# Patient Record
Sex: Female | Born: 1945
Health system: Southern US, Community
[De-identification: ages and names within clinical notes are randomized; demographics above are authoritative.]

## PROBLEM LIST (undated history)

## (undated) DIAGNOSIS — M25569 Pain in unspecified knee: Secondary | ICD-10-CM

## (undated) DIAGNOSIS — M722 Plantar fascial fibromatosis: Secondary | ICD-10-CM

## (undated) DIAGNOSIS — H269 Unspecified cataract: Secondary | ICD-10-CM

## (undated) DIAGNOSIS — M1990 Unspecified osteoarthritis, unspecified site: Secondary | ICD-10-CM

## (undated) DIAGNOSIS — Z9989 Dependence on other enabling machines and devices: Secondary | ICD-10-CM

## (undated) DIAGNOSIS — B1920 Unspecified viral hepatitis C without hepatic coma: Secondary | ICD-10-CM

## (undated) DIAGNOSIS — E1140 Type 2 diabetes mellitus with diabetic neuropathy, unspecified: Secondary | ICD-10-CM

## (undated) DIAGNOSIS — G40909 Epilepsy, unspecified, not intractable, without status epilepticus: Secondary | ICD-10-CM

## (undated) DIAGNOSIS — I10 Essential (primary) hypertension: Secondary | ICD-10-CM

## (undated) DIAGNOSIS — G4733 Obstructive sleep apnea (adult) (pediatric): Secondary | ICD-10-CM

## (undated) DIAGNOSIS — E876 Hypokalemia: Secondary | ICD-10-CM

## (undated) HISTORY — DX: Type 2 diabetes mellitus with diabetic neuropathy, unspecified: E11.40

## (undated) HISTORY — DX: Obstructive sleep apnea (adult) (pediatric): G47.33

## (undated) HISTORY — DX: Morbid (severe) obesity due to excess calories: E66.01

## (undated) HISTORY — PX: EYE SURGERY: SHX253

## (undated) HISTORY — PX: CATARACT EXTRACTION: SUR2

## (undated) HISTORY — DX: Unspecified viral hepatitis C without hepatic coma: B19.20

## (undated) HISTORY — DX: Plantar fascial fibromatosis: M72.2

## (undated) HISTORY — DX: Unspecified cataract: H26.9

## (undated) HISTORY — PX: ABDOMINAL HYSTERECTOMY: SHX81

## (undated) HISTORY — DX: Hypokalemia: E87.6

## (undated) HISTORY — DX: Dependence on other enabling machines and devices: Z99.89

## (undated) HISTORY — DX: Epilepsy, unspecified, not intractable, without status epilepticus: G40.909

## (undated) HISTORY — PX: TUBAL LIGATION: SHX77

---

## 2005-09-02 ENCOUNTER — Encounter: Admission: RE | Admit: 2005-09-02 | Discharge: 2005-09-02 | Payer: Self-pay | Admitting: Family Medicine

## 2006-02-19 ENCOUNTER — Emergency Department (HOSPITAL_COMMUNITY): Admission: EM | Admit: 2006-02-19 | Discharge: 2006-02-19 | Payer: Self-pay | Admitting: Emergency Medicine

## 2006-03-26 ENCOUNTER — Encounter: Admission: RE | Admit: 2006-03-26 | Discharge: 2006-03-26 | Payer: Self-pay | Admitting: Orthopedic Surgery

## 2006-04-01 ENCOUNTER — Encounter: Admission: RE | Admit: 2006-04-01 | Discharge: 2006-04-01 | Payer: Self-pay | Admitting: Orthopedic Surgery

## 2006-04-21 ENCOUNTER — Encounter: Admission: RE | Admit: 2006-04-21 | Discharge: 2006-04-21 | Payer: Self-pay | Admitting: Orthopedic Surgery

## 2007-04-13 ENCOUNTER — Encounter: Admission: RE | Admit: 2007-04-13 | Discharge: 2007-04-13 | Payer: Self-pay | Admitting: Orthopedic Surgery

## 2007-07-02 ENCOUNTER — Encounter: Admission: RE | Admit: 2007-07-02 | Discharge: 2007-07-02 | Payer: Self-pay | Admitting: Family Medicine

## 2008-05-21 ENCOUNTER — Emergency Department (HOSPITAL_COMMUNITY): Admission: EM | Admit: 2008-05-21 | Discharge: 2008-05-21 | Payer: Self-pay | Admitting: Emergency Medicine

## 2009-01-12 ENCOUNTER — Encounter: Admission: RE | Admit: 2009-01-12 | Discharge: 2009-01-12 | Payer: Self-pay | Admitting: Family Medicine

## 2009-02-05 ENCOUNTER — Encounter: Admission: RE | Admit: 2009-02-05 | Discharge: 2009-05-06 | Payer: Self-pay | Admitting: Family Medicine

## 2009-06-08 ENCOUNTER — Encounter: Admission: RE | Admit: 2009-06-08 | Discharge: 2009-09-06 | Payer: Self-pay | Admitting: Family Medicine

## 2010-01-29 ENCOUNTER — Encounter: Admission: RE | Admit: 2010-01-29 | Discharge: 2010-01-29 | Payer: Self-pay | Admitting: Family Medicine

## 2010-05-06 ENCOUNTER — Emergency Department (HOSPITAL_COMMUNITY): Admission: EM | Admit: 2010-05-06 | Discharge: 2010-05-06 | Payer: Self-pay | Admitting: Emergency Medicine

## 2010-07-26 ENCOUNTER — Encounter: Payer: Self-pay | Admitting: Family Medicine

## 2010-07-26 ENCOUNTER — Ambulatory Visit: Payer: Self-pay | Admitting: Family Medicine

## 2010-07-26 DIAGNOSIS — I10 Essential (primary) hypertension: Secondary | ICD-10-CM | POA: Insufficient documentation

## 2010-07-26 DIAGNOSIS — M159 Polyosteoarthritis, unspecified: Secondary | ICD-10-CM | POA: Insufficient documentation

## 2010-07-26 DIAGNOSIS — E669 Obesity, unspecified: Secondary | ICD-10-CM | POA: Insufficient documentation

## 2010-07-26 LAB — CONVERTED CEMR LAB
BUN: 19 mg/dL (ref 6–23)
CO2: 30 meq/L (ref 19–32)
Calcium: 9.4 mg/dL (ref 8.4–10.5)
Chloride: 102 meq/L (ref 96–112)
Creatinine, Ser: 0.92 mg/dL (ref 0.40–1.20)
Glucose, Bld: 118 mg/dL — ABNORMAL HIGH (ref 70–99)
Potassium: 3.9 meq/L (ref 3.5–5.3)
Sodium: 141 meq/L (ref 135–145)

## 2010-07-29 ENCOUNTER — Telehealth: Payer: Self-pay | Admitting: *Deleted

## 2010-08-15 ENCOUNTER — Ambulatory Visit: Payer: Self-pay | Admitting: Family Medicine

## 2010-08-15 ENCOUNTER — Encounter: Payer: Self-pay | Admitting: Family Medicine

## 2010-08-15 ENCOUNTER — Ambulatory Visit (HOSPITAL_COMMUNITY): Admission: RE | Admit: 2010-08-15 | Discharge: 2010-08-15 | Payer: Self-pay | Admitting: Family Medicine

## 2010-08-16 ENCOUNTER — Telehealth: Payer: Self-pay | Admitting: *Deleted

## 2010-08-16 LAB — CONVERTED CEMR LAB
BUN: 20 mg/dL (ref 6–23)
CO2: 27 meq/L (ref 19–32)
Calcium: 9.7 mg/dL (ref 8.4–10.5)
Chloride: 101 meq/L (ref 96–112)
Creatinine, Ser: 0.95 mg/dL (ref 0.40–1.20)
Glucose, Bld: 113 mg/dL — ABNORMAL HIGH (ref 70–99)
Potassium: 3.3 meq/L — ABNORMAL LOW (ref 3.5–5.3)
Sodium: 139 meq/L (ref 135–145)

## 2010-09-04 ENCOUNTER — Ambulatory Visit: Payer: Self-pay | Admitting: Family Medicine

## 2010-09-04 DIAGNOSIS — J019 Acute sinusitis, unspecified: Secondary | ICD-10-CM | POA: Insufficient documentation

## 2010-09-20 ENCOUNTER — Ambulatory Visit: Payer: Self-pay

## 2010-10-14 ENCOUNTER — Telehealth: Payer: Self-pay | Admitting: Family Medicine

## 2010-10-22 ENCOUNTER — Ambulatory Visit: Admit: 2010-10-22 | Payer: Self-pay

## 2010-11-05 NOTE — Progress Notes (Signed)
----   Converted from flag ---- ---- 07/28/2010 6:59 PM, Milinda Antis MD wrote: Please let pt know her lab work which looked at her kidney function was normal. I will see her in 3 weeks to follow-up her blood work ------------------------------  called pt, informed of above message.

## 2010-11-05 NOTE — Assessment & Plan Note (Signed)
Summary: f/u bp   Vital Signs:  Patient profile:   65 year old female Height:      63.25 inches Weight:      250.4 pounds BMI:     44.17 Temp:     98.1 degrees F oral Pulse rate:   66 / minute BP sitting:   167 / 86  (right arm) Cuff size:   large  Vitals Entered By: Jimmy Footman, CMA (September 04, 2010 10:06 AM) CC: HTN, sinus pressure Is Patient Diabetic? No Pain Assessment Patient in pain? no      Comments left itching   Primary Care Provider:  Milinda Antis MD  CC:  HTN and sinus pressure.  History of Present Illness:   Sinus pressure- used nasal spray (nasal saline ) over the counter which helped, but now has itching in the ears , off and on for past few weeks. no pressure in ear, hearing is okay  no cough, no fever   Hypertension-- arm does not fit in cuff at [facility], weight down 3 lbs, no chest pain, no change in vision, feels she needs more medication to help with Bp, no leg swelling  Headache- no further headaches since stopping cardura  No meds this AM secondary to not eating this AM  Habits & Providers  Alcohol-Tobacco-Diet     Tobacco Status: never  Current Medications (verified): 1)  Epitol 200 Mg Tabs (Carbamazepine) .[name].. 1 By Mouth Two Times A Day 2)  Lisinopril-Hydrochlorothiazide 20-25 Mg Tabs (Lisinopril-Hydrochlorothiazide) .[name].. 1 By Mouth Daily For Blood Pressure 3)  Meloxicam 7.5 Mg Tabs (Meloxicam) .[name].. 1 By Mouth Daily 4)  Metoprolol Tartrate 25 Mg Tabs (Metoprolol Tartrate) .... Take 1/2 Tablet By Mouth Two Times A Day  Allergies (verified): No Known Drug Allergies  Past History:  Past Medical History: Last updated: 07/26/2010 HTN Arthritis- multiple sites Bulging Disc in back Leg Swelling Seizure  No history of heart attack or Stroke   Review of Systems       Per HPI  Physical Exam  General:  NAD, obese, pleasant Vital signs noted  Head:  Face non tender over maxillary ethmoid sinus, region, mild pressure over front  region Eyes:  clear conjunctiva Ears:  TM clear bilat, canals clear Nose:  slightly erythematous turbinates, no discharge noted Mouth:  MMM, oropharynx clear Neck:  supple no lymphadenopathy  Lungs:  CTAB Heart:  RRR, no murmur Extremities:  no edema   Impression & Recommendations:  Problem # 1:  ESSENTIAL HYPERTENSION, BENIGN (ICD-401.1) Assessment Unchanged  Pt bp on last visit was normal however over recommended dose for HCTZ and taking cardura which was causing headaches. Will add low dose BP to ACE/HCTZ combo, monitor HR, pt given side effects, recheck in 2 weeks Her updated medication list for this problem includes:    Lisinopril-hydrochlorothiazide 20-25 Mg Tabs (Lisinopril-hydrochlorothiazide) .[name]... 1 by mouth daily for blood pressure    Metoprolol Tartrate 25 Mg Tabs (Metoprolol tartrate) .[name]... Take 1/2 tablet by mouth two times a day   Records release sent again  Orders: [facility]- Est  Level 4 ([address]454)  Problem # 2:  SINUSITIS, ACUTE (ICD-461.9) Assessment: New  Likley viral mediated, improved with nasal saline, pt declines, nasal steroid, for ear itching advised moisturizing, try OTC anti-histamine  Orders: [facility]- Est  Level 4 ([address]214)  Complete Medication List: 1)  Epitol 200 Mg Tabs (Carbamazepine) .[name].. 1 by mouth two times a day 2)  Lisinopril-hydrochlorothiazide 20-25 Mg Tabs (Lisinopril-hydrochlorothiazide) .[name].. 1 by mouth daily  for blood pressure 3)  Meloxicam 7.5 Mg Tabs (Meloxicam) .[name].. 1 by mouth daily 4)  Metoprolol Tartrate 25 Mg Tabs (Metoprolol tartrate) .... Take 1/2 tablet by mouth two times a day  Patient Instructions: 1)  Continue the Nasal Saline you can try over the counter loratadine once a day as needed  2)  Start the new medication Metoprolol take 1/2 tablet two times a day for your blood pressure  3)  Return in 2 weeks for repeat blood pressure check Prescriptions: METOPROLOL TARTRATE 25 MG TABS (METOPROLOL TARTRATE) Take 1/2 tablet by mouth two  times a day  #60 x 1   Entered and Authorized by:   Milinda Antis MD   Signed by:   Milinda Antis MD on 09/04/2010   Method used:   Electronically to        [facility] [phone]* (retail)       [address]       [place], [place]  [address]       Ph: [phone]       Fax: [phone]   RxID:   [id number]    Orders Added: 1)  [facility]- Est  Level 4 [02725]     Prevention & Chronic Care Immunizations   Influenza vaccine: Not documented   Influenza vaccine deferral: Refused  (09/04/2010)    Tetanus booster: Not documented    Pneumococcal vaccine: Not documented    H. zoster vaccine: Not documented  Colorectal Screening   Hemoccult: Not documented    Colonoscopy: Not documented  Other Screening   Pap smear: Not documented    Mammogram: Not documented    DXA bone density scan: Not documented   Smoking status: never  (09/04/2010)  Lipids   Total Cholesterol: Not documented   LDL: Not documented   LDL Direct: Not documented   HDL: Not documented   Triglycerides: Not documented  Hypertension   Last Blood Pressure: 167 / 86  (09/04/2010)   Serum creatinine: 0.95  (08/15/2010)   Serum potassium 3.3  (08/15/2010)    Hypertension flowsheet reviewed?: Yes   Progress toward BP goal: Unchanged  Self-Management Support :   Personal Goals (by the next clinic visit) :      Personal blood pressure goal: 140/90  (09/04/2010)   Hypertension self-management support: Not documented

## 2010-11-05 NOTE — Progress Notes (Signed)
Summary: re: labs/ts  ---- Converted from flag ---- ---- 08/16/2010 8:43 AM, Milinda Antis MD wrote: please let pt know her labs were okay, her kidney function was normal. Her potassium was a little low but she does not need any supplements, I will see her in 2 weeks ------------------------------ called pt and informed. pt agreed.Arlyss Repress CMA,  August 16, 2010 10:27 AM

## 2010-11-05 NOTE — Assessment & Plan Note (Signed)
Summary: f/u  kh   Vital Signs:  Patient profile:   65 year old female Height:      63.25 inches Weight:      253.3 pounds BMI:     44.68 Pulse rate:   85 / minute BP sitting:   137 / 84  (left arm) Cuff size:   large  Vitals Entered By: Arlyss Repress CMA, (August 15, 2010 9:00 AM) CC: f/up HTN and labs. refill meds. Is Patient Diabetic? No Pain Assessment Patient in pain? no        Primary Care Provider:  Milinda Antis MD  CC:  f/up HTN and labs. refill meds..  History of Present Illness:   Sat pt fel x2l wearing high heels, feel on outstreched hand, did not hit her head, pain in left hand, +swelling and pain worse in palm, pt iced and put a brace on it, continued pain but some improvement difficulty cooking, driving, bathing seconary to pain  HTN-  Pt was taking both HCTZ 25mg  and the HCTZ/Lisinopril combo pill daily    Insomina- started taking Cardura back again prescribed previously by Dr. Lora Havens for insomnia for past few days, has noticed a "rythem in her head" occurs at night, no change in vision, no change in speech   Leg swelling has improved, has improved   Ultram caused her to have back pain??, no N/V,no change in stools, so she restarted Meloxicam which helped  Habits & Providers  Alcohol-Tobacco-Diet     Tobacco Status: never  Current Medications (verified): 1)  Epitol 200 Mg Tabs (Carbamazepine) .[name].. 1 By Mouth Two Times A Day 2)  Lisinopril-Hydrochlorothiazide 20-25 Mg Tabs (Lisinopril-Hydrochlorothiazide) .[name].. 1 By Mouth Daily For Blood Pressure 3)  Meloxicam 7.5 Mg Tabs (Meloxicam) .[name].. 1 By Mouth Daily  Allergies (verified): No Known Drug Allergies  Physical Exam  General:  NAD, obese, pleasant Vital signs noted  Head:  atraumatic Eyes:  PERRL, small pupils no hemmorhage seen,diffcult to see entire optic disc Neck:  supple and full ROM.   Lungs:  CTAB Heart:  RRR, no murmur Msk:  Left hand- mild generalized swelling, increased  fullness over thumb, TTP over antomical snuff box, , normal ROM and grasp in hand, though painful, unable to extend thumb without pain,  Right hand-normal strength, grasp Pulses:  radial pulses 2+ Neurologic:  Sensation in hand intact no focal deficits   Impression & Recommendations:  Problem # 1:  ESSENTIAL HYPERTENSION, BENIGN (ICD-401.1) Assessment Improved Pt was taking 50mg  of HCTZ, it appears I sent both prescriptions. Will d/c the 25mg  tab, continue to ACE/HCTZ combo check BMET The following medications were removed from the medication list:    Hydrochlorothiazide 25 Mg Tabs (Hydrochlorothiazide) .[name]... 1 by mouth daily Her updated medication list for this problem includes:    Lisinopril-hydrochlorothiazide 20-25 Mg Tabs (Lisinopril-hydrochlorothiazide) .[name]... 1 by mouth daily for blood pressure  Orders: Basic [facility] ([address]1478) [facility]- Est  Level 4 ([address])  Problem # 2:  PERIPHERAL EDEMA (ICD-782.3) Assessment: Improved  improved s/p d/c norvasc The following medications were removed from the medication list:    Hydrochlorothiazide 25 Mg Tabs (Hydrochlorothiazide) .[name]... 1 by mouth daily Her updated medication list for this problem includes:    Lisinopril-hydrochlorothiazide 20-25 Mg Tabs (Lisinopril-hydrochlorothiazide) .[name]... 1 by mouth daily for blood pressure  Orders: [facility]- Est  Level 4 ([address])  Problem # 3:  HAND PAIN, LEFT (ICD-729.5) Assessment: New Concern for small fracture, as pt fell with outstreched hand and location of pain, currently  has brace and nsaids, obtain x-ray Orders: Radiology other (Radiology Other) [facility]- Est  Level 4 ([address])  Problem # 4:  HEADACHE (ICD-784.0) Assessment: New  Concern this may be due to addition of cardura, no stroke symptoms present , no history of migraine. possibly pt bp too low during the night. will d/c cardura and follow up symptoms The following medications were removed from the medication list:    Ultram 50 Mg Tabs  (Tramadol hcl) .[name]... 1 by mouth three times a day as needed pain Her updated medication list for this problem includes:    Meloxicam 7.5 Mg Tabs (Meloxicam) .[name]... 1 by mouth daily  Orders: [facility]- Est  Level 4 ([address])  Complete Medication List: 1)  Epitol 200 Mg Tabs (Carbamazepine) .[name].. 1 by mouth two times a day 2)  Lisinopril-hydrochlorothiazide 20-25 Mg Tabs (Lisinopril-hydrochlorothiazide) .[name].. 1 by mouth daily for blood pressure 3)  Meloxicam 7.5 Mg Tabs (Meloxicam) .[name].. 1 by mouth daily  Patient Instructions: 1)  Stop taking the cardura 2)  Stop taking the extra HCTZ 25mg  pill  3)  Continue the combination pill of HCTZ/Lisinopril 4)  Continue the Meloxicam  5)  I will call you about the x-ray results  6)  I will call you about your lab results 7)  f/u 2 weeks for blood pressure  Prescriptions: MELOXICAM 7.5 MG TABS (MELOXICAM) 1 by mouth daily  #30 x 3   Entered and Authorized by:   Milinda Antis MD   Signed by:   Milinda Antis MD on 08/15/2010   Method used:   Electronically to        [facility] [phone]* (retail)       [address]       [place], [place]  [address]       Ph: [phone]       Fax: [phone]   RxID:   [id number]    Orders Added: 1)  Basic Met[facility] [[address]] 2)  Radiology other [Radiology Other] 3)  [facility]- Est  Level 4 [[address]74]

## 2010-11-05 NOTE — Miscellaneous (Signed)
Summary: ROI  ROI   Imported By: Clydell Hakim 07/29/2010 16:47:07  _____________________________________________________________________  External Attachment:    Type:   Image     Comment:   External Document

## 2010-11-05 NOTE — Assessment & Plan Note (Signed)
Summary: NP,tcb- need records   Vital Signs:  Patient profile:   65 year old female Height:      63.25 inches Weight:      252.50 pounds BMI:     44.54 BSA:     2.14 Temp:     98.0 degrees F Pulse rate:   64 / minute BP sitting:   160 / 102  Vitals Entered By: Jone Baseman CMA (July 26, 2010 1:45 PM)  Serial Vital Signs/Assessments:  Time      Position  BP       Pulse  Resp  Temp     By                     160/100                        Milinda Antis MD  CC: NP Is Patient Diabetic? No Pain Assessment Patient in pain? no        CC:  NP.  History of Present Illness:   Previously seeing Dr. Parke Simmers  Arthritis- has arthritis in hand and hips, knees multiple cortisone shots, pain occurs every now and then such as changes in whether, wants to join the gym, had lost 30lbs now gained the weight back Care for ger grandchildren. Tried celebrex, seen ortho for shots,   HTN- currently on hydrochlorothiazide and Norvasc , last saw PCP april , does not take bp at home, previouusly on [place] and HCTZ combo, changed in April   Leg Swelling- feel like collecting fluid since May, thinks maybe secondary to weight or being on feet   Seizure disorder- on Tegretol - one seizure in 1994, saw a neurologist, happenend while sleeping had rehab, denies CVA  Prevention- had mammogram this year, last PAP > 5 years ago,  Colonoscopy - 4 years ago   Blood work done in April of 2011  Habits & Providers  Alcohol-Tobacco-Diet     Tobacco Status: never  Current Medications (verified): 1)  Epitol 200 Mg Tabs (Carbamazepine) .[name].. 1 By Mouth Two Times A Day 2)  Hydrochlorothiazide 25 Mg Tabs (Hydrochlorothiazide) .[name].. 1 By Mouth Daily 3)  Lisinopril-Hydrochlorothiazide 20-25 Mg Tabs (Lisinopril-Hydrochlorothiazide) .[name].. 1 By Mouth Daily For Blood Pressure 4)  Ultram 50 Mg Tabs (Tramadol Hcl) .[name].. 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   Psych:  good eye contact and not  depressed appearing.     Impression & Recommendations:  Problem # 1:  ESSENTIAL HYPERTENSION, BENIGN (ICD-401.1) Assessment New Poorly controlled, per history likley pre-diabetic need better control, concened leg edema may be side effect of Norvasc. Add ACE to diuretic and recheck. Check BMET Her updated medication list for this problem includes:    Hydrochlorothiazide 25 Mg Tabs (Hydrochlorothiazide) .[name]... 1 by mouth daily    Lisinopril-hydrochlorothiazide 20-25 Mg Tabs (Lisinopril-hydrochlorothiazide) .[name]... 1 by mouth daily for blood pressure  Orders: Basic [facility] ([address])  Problem # 2:  PERIPHERAL EDEMA (ICD-782.3) Assessment: New d/c norvasc, continue thiazide Her updated medication list for this problem includes:    Hydrochlorothiazide 25 Mg Tabs (Hydrochlorothiazide) .[name]... 1 by mouth daily    Lisinopril-hydrochlorothiazide 20-25 Mg Tabs  (Lisinopril-hydrochlorothiazide) .[name]... 1 by mouth daily for blood pressure  Problem # 3:  DEGENERATIVE JOINT DISEASE, GENERALIZED (ICD-715.00) Assessment: New Arthritis is multiple joints. Trial of Ultram, previously on Meloxicam Get notes from previous PCP Her updated medication list for this problem includes:    Ultram 50 Mg Tabs (Tramadol hcl) .[name]... 1 by mouth three times a day as needed pain  Problem # 4:  OBESITY (ICD-278.00) Assessment: New  Complete Medication List: 1)  Epitol 200 Mg Tabs (Carbamazepine) .[name].. 1 by mouth two times a day 2)  Hydrochlorothiazide 25 Mg Tabs (Hydrochlorothiazide) .[name].. 1 by mouth daily 3)  Lisinopril-hydrochlorothiazide 20-25 Mg Tabs (Lisinopril-hydrochlorothiazide) .[name].. 1 by mouth daily for blood pressure 4)  Ultram 50 Mg Tabs (Tramadol hcl) .[name].. 1 by mouth three times a day as needed pain  Patient Instructions: 1)  Stop taking the Norvasc 2)  Start the combination pill of HTCZ/Lisinopril once a day 3)  Do not take the extra HCTZ tablet with the combination pill 4)  Start the Ultram as needed for pain 5)  We will call you about your labs  6)  Come back in 3 weeks for follow-up on blood pressure  Prescriptions: ULTRAM 50 MG TABS (TRAMADOL HCL) 1 by mouth three times a day as needed pain  #180 x 1   Entered and Authorized by:   Milinda Antis MD   Signed by:   Milinda Antis MD on 07/26/2010   Method used:   Electronically to        [facility]  [address] [phone]* (retail)       [address]       [place], [place]  [id number]       Ph: [phone] or [phone]       Fax: [phone]   RxID:   [id number] LISINOPRIL-HYDROCHLOROTHIAZIDE 20-25 MG TABS (LISINOPRIL-HYDROCHLOROTHIAZIDE) 1 by mouth daily for blood pressure  #30 x 3   Entered and Authorized by:   Milinda Antis MD   Signed by:   Milinda Antis MD on 07/26/2010   Method used:   Electronically to        [facility] [phone]* (retail)        [address]       [place], [place]  [address]       Ph: [phone]       Fax: [phone]   RxID:   [id number] HYDROCHLOROTHIAZIDE 25 MG TABS (HYDROCHLOROTHIAZIDE) 1 by mouth daily  #30 x 1   Entered and Authorized by:   Milinda Antis MD   Signed by:   Milinda Antis MD on 07/26/2010   Method used:   Electronically to  [facility] [address] [phone]* (retail)       [address]       [place], [place]  [address]       Ph: [phone]       Fax: [phone]   RxID:   [id number] EPITOL 200 MG TABS (CARBAMAZEPINE) 1 by mouth two times a day  #180 x 0   Entered and Authorized by:   Milinda Antis MD   Signed by:   Milinda Antis MD on 07/26/2010   Method used:   Electronically to        [facility] [phone]* (retail)       [address]       [place], [place]  [address]       Ph: [phone]       Fax: [phone]   RxID:   [id number]    Orders Added: 1)  Basic Met-[facility] [[address]] 2)  [facility]- New Level 3 [[address]]

## 2010-11-06 ENCOUNTER — Encounter: Payer: Self-pay | Admitting: *Deleted

## 2010-11-07 NOTE — Progress Notes (Signed)
Summary: Rx  Phone Note Refill Request Call back at Home Phone [phone]   Refills Requested: Medication #1:  EPITOL 200 MG TABS 1 by mouth two times a day Initial call taken by: Knox Royalty,  October 14, 2010 9:55 AM    Prescriptions: EPITOL 200 MG TABS (CARBAMAZEPINE) 1 by mouth two times a day  #180 x 1   Entered and Authorized by:   Milinda Antis MD   Signed by:   Milinda Antis MD on 10/14/2010   Method used:   Electronically to        [facility] [phone]* (retail)       [address]       [place], [place]  [address]       Ph: [phone]       Fax: [phone]   RxID:   [id number]

## 2010-12-20 LAB — URINALYSIS, ROUTINE W REFLEX MICROSCOPIC
Bilirubin Urine: NEGATIVE
Glucose, UA: NEGATIVE mg/dL
Hgb urine dipstick: NEGATIVE
Ketones, ur: NEGATIVE mg/dL
Nitrite: NEGATIVE
Protein, ur: NEGATIVE mg/dL
Specific Gravity, Urine: 1.018 (ref 1.005–1.030)
Urobilinogen, UA: 0.2 mg/dL (ref 0.0–1.0)
pH: 7.5 (ref 5.0–8.0)

## 2010-12-24 ENCOUNTER — Other Ambulatory Visit: Payer: Self-pay | Admitting: Family Medicine

## 2010-12-24 DIAGNOSIS — Z1231 Encounter for screening mammogram for malignant neoplasm of breast: Secondary | ICD-10-CM

## 2010-12-30 ENCOUNTER — Ambulatory Visit
Admission: RE | Admit: 2010-12-30 | Discharge: 2010-12-30 | Disposition: A | Discharge status: Home / Self Care | Payer: Self-pay | Source: Ambulatory Visit | Attending: Family Medicine | Admitting: Family Medicine

## 2010-12-30 DIAGNOSIS — Z1231 Encounter for screening mammogram for malignant neoplasm of breast: Secondary | ICD-10-CM

## 2011-04-11 ENCOUNTER — Other Ambulatory Visit: Payer: Self-pay | Admitting: Orthopedic Surgery

## 2011-04-11 DIAGNOSIS — M545 Low back pain, unspecified: Secondary | ICD-10-CM

## 2011-04-22 ENCOUNTER — Ambulatory Visit
Admission: RE | Admit: 2011-04-22 | Discharge: 2011-04-22 | Disposition: A | Discharge status: Home / Self Care | Payer: No Typology Code available for payment source | Source: Ambulatory Visit | Attending: Orthopedic Surgery | Admitting: Orthopedic Surgery

## 2011-04-22 DIAGNOSIS — M545 Low back pain, unspecified: Secondary | ICD-10-CM

## 2011-05-05 ENCOUNTER — Ambulatory Visit (HOSPITAL_COMMUNITY)
Admission: RE | Admit: 2011-05-05 | Discharge: 2011-05-05 | Disposition: A | Discharge status: Home / Self Care | Payer: Self-pay | Source: Ambulatory Visit | Attending: Orthopedic Surgery | Admitting: Orthopedic Surgery

## 2011-05-05 ENCOUNTER — Encounter (HOSPITAL_COMMUNITY)
Admission: RE | Admit: 2011-05-05 | Discharge: 2011-05-05 | Disposition: A | Discharge status: Home / Self Care | Payer: Self-pay | Source: Ambulatory Visit | Attending: Orthopedic Surgery | Admitting: Orthopedic Surgery

## 2011-05-05 ENCOUNTER — Other Ambulatory Visit (HOSPITAL_COMMUNITY): Payer: Self-pay | Admitting: Orthopedic Surgery

## 2011-05-05 DIAGNOSIS — L600 Ingrowing nail: Secondary | ICD-10-CM

## 2011-05-05 DIAGNOSIS — Z01818 Encounter for other preprocedural examination: Secondary | ICD-10-CM | POA: Insufficient documentation

## 2011-05-05 DIAGNOSIS — Z01810 Encounter for preprocedural cardiovascular examination: Secondary | ICD-10-CM | POA: Insufficient documentation

## 2011-05-05 DIAGNOSIS — Z01812 Encounter for preprocedural laboratory examination: Secondary | ICD-10-CM | POA: Insufficient documentation

## 2011-05-05 LAB — BASIC METABOLIC PANEL
BUN: 23 mg/dL (ref 6–23)
CO2: 28 mEq/L (ref 19–32)
Calcium: 10.5 mg/dL (ref 8.4–10.5)
Chloride: 97 mEq/L (ref 96–112)
Creatinine, Ser: 1.03 mg/dL (ref 0.50–1.10)
GFR calc Af Amer: >60 mL/min (ref >60)
GFR calc non Af Amer: 54 mL/min — ABNORMAL LOW (ref >60)
Glucose, Bld: 96 mg/dL (ref 70–99)
Potassium: 4.2 mEq/L (ref 3.5–5.1)
Sodium: 136 mEq/L (ref 135–145)

## 2011-05-05 LAB — CBC
HCT: 36.1 % (ref 36.0–46.0)
Hemoglobin: 12.5 g/dL (ref 12.0–15.0)
MCH: 31 pg (ref 26.0–34.0)
MCHC: 34.6 g/dL (ref 30.0–36.0)
MCV: 89.6 fL (ref 78.0–100.0)
Platelets: 284 10*3/uL (ref 150–400)
RBC: 4.03 MIL/uL (ref 3.87–5.11)
RDW: 12.3 % (ref 11.5–15.5)
WBC: 7.7 10*3/uL (ref 4.0–10.5)

## 2011-05-05 LAB — SURGICAL PCR SCREEN
MRSA, PCR: NEGATIVE
Staphylococcus aureus: NEGATIVE

## 2011-05-13 ENCOUNTER — Ambulatory Visit (HOSPITAL_COMMUNITY)
Admission: RE | Admit: 2011-05-13 | Discharge: 2011-05-13 | Disposition: A | Discharge status: Home / Self Care | Payer: Self-pay | Source: Ambulatory Visit | Attending: Orthopedic Surgery | Admitting: Orthopedic Surgery

## 2011-05-13 DIAGNOSIS — I10 Essential (primary) hypertension: Secondary | ICD-10-CM | POA: Insufficient documentation

## 2011-05-13 DIAGNOSIS — E669 Obesity, unspecified: Secondary | ICD-10-CM | POA: Insufficient documentation

## 2011-05-13 DIAGNOSIS — K219 Gastro-esophageal reflux disease without esophagitis: Secondary | ICD-10-CM | POA: Insufficient documentation

## 2011-05-13 DIAGNOSIS — L600 Ingrowing nail: Secondary | ICD-10-CM | POA: Insufficient documentation

## 2011-05-13 DIAGNOSIS — E119 Type 2 diabetes mellitus without complications: Secondary | ICD-10-CM | POA: Insufficient documentation

## 2011-05-13 LAB — GLUCOSE, CAPILLARY
Glucose-Capillary: 99 mg/dL (ref 70–99)
Glucose-Capillary: 98 mg/dL (ref 70–99)

## 2011-06-18 ENCOUNTER — Emergency Department (HOSPITAL_COMMUNITY): Payer: Self-pay

## 2011-06-18 ENCOUNTER — Emergency Department (HOSPITAL_COMMUNITY)
Admission: EM | Admit: 2011-06-18 | Discharge: 2011-06-18 | Disposition: A | Discharge status: Home / Self Care | Payer: Self-pay | Attending: Emergency Medicine | Admitting: Emergency Medicine

## 2011-06-18 DIAGNOSIS — M7989 Other specified soft tissue disorders: Secondary | ICD-10-CM | POA: Insufficient documentation

## 2011-06-18 DIAGNOSIS — X58XXXA Exposure to other specified factors, initial encounter: Secondary | ICD-10-CM | POA: Insufficient documentation

## 2011-06-18 DIAGNOSIS — I10 Essential (primary) hypertension: Secondary | ICD-10-CM | POA: Insufficient documentation

## 2011-06-18 DIAGNOSIS — IMO0002 Reserved for concepts with insufficient information to code with codable children: Secondary | ICD-10-CM | POA: Insufficient documentation

## 2011-06-18 LAB — GLUCOSE, CAPILLARY: Glucose-Capillary: 181 mg/dL — ABNORMAL HIGH (ref 70–99)

## 2011-06-18 NOTE — Op Note (Signed)
  NAMETAYVA, EASTERDAY NO.:  [id number]  MEDICAL RECORD NO.:  [id number]  LOCATION:                                 FACILITY:  PHYSICIAN:  Burnard Bunting, M.D.    DATE OF BIRTH:  September 09, 1946  DATE OF PROCEDURE: DATE OF DISCHARGE:                              OPERATIVE REPORT   PREOPERATIVE DIAGNOSIS:  Right great toenail ingrowth.  POSTOPERATIVE DIAGNOSIS:  Right great toenail ingrowth.  PROCEDURE:  Right great toe nail removal and ablation of phenol.  SURGEON:  Burnard Bunting, MD.  ASSISTANT:  None.  ANESTHESIA:  Regional.  INDICATIONS:  Cathy Gonzalez is a 65 year old patient with right great toenail ingrowth.  She has had performed prior attempted ablation with regrowth of the toenail.  She presents now for further management.  PROCEDURE IN DETAIL:  The patient was brought to the operating room where regional anesthetic was induced.  IV antibiotics were administered.  Time-out was called.  Right foot was prepped with Hibiclens saline, draped in sterile manner.  Toe Esmarch was utilized for approximately 15 minutes.  Using a Therapist, nutritional, the toe nail was removed off of the sterile and germinal matrix.  With a bottle of sealed phenol on Q-tip was used to cover both sterile and germinal matrix. This was allowed to sit for 2 minutes and was neutralized with alcohol, then irrigated.  The cycle was repeated three times with a care being taken to avoid unnecessary contact with the phenol with the surrounding tissues other than the sterile and germinal matrix.  Following neutralization x3 with a phenol alcohol and saline combination, the toe tourniquet was released.  The toe nail was covered with ointment and Telfa pad.  The patient tolerated the procedure well without immediate complication.  Distal block was administered.     Burnard Bunting, M.D.     GSD/MEDQ  D:  05/13/2011  T:  05/13/2011  Job:  [id number]  Electronically Signed by Reece Agar.  Cathy Gonzalez M.D. on  06/18/2011 08:32:29 AM

## 2011-06-25 ENCOUNTER — Ambulatory Visit (INDEPENDENT_AMBULATORY_CARE_PROVIDER_SITE_OTHER): Payer: Self-pay

## 2011-06-25 ENCOUNTER — Inpatient Hospital Stay (INDEPENDENT_AMBULATORY_CARE_PROVIDER_SITE_OTHER)
Admission: RE | Admit: 2011-06-25 | Discharge: 2011-06-25 | Disposition: A | Discharge status: Home / Self Care | Payer: Self-pay | Source: Ambulatory Visit | Attending: Emergency Medicine | Admitting: Emergency Medicine

## 2011-06-25 DIAGNOSIS — IMO0002 Reserved for concepts with insufficient information to code with codable children: Secondary | ICD-10-CM

## 2011-06-25 LAB — GLUCOSE, CAPILLARY: Glucose-Capillary: 87 mg/dL (ref 70–99)

## 2011-07-14 ENCOUNTER — Other Ambulatory Visit: Payer: Self-pay | Admitting: Orthopedic Surgery

## 2011-07-14 DIAGNOSIS — M545 Low back pain, unspecified: Secondary | ICD-10-CM

## 2011-07-16 ENCOUNTER — Ambulatory Visit
Admission: RE | Admit: 2011-07-16 | Discharge: 2011-07-16 | Disposition: A | Discharge status: Home / Self Care | Payer: Self-pay | Source: Ambulatory Visit | Attending: Orthopedic Surgery | Admitting: Orthopedic Surgery

## 2011-07-16 DIAGNOSIS — M545 Low back pain, unspecified: Secondary | ICD-10-CM

## 2011-07-16 MED ORDER — METHYLPREDNISOLONE ACETATE 40 MG/ML INJ SUSP (RADIOLOG
120.0000 mg | Freq: Once | INTRAMUSCULAR | Status: AC
  Administered 2011-07-16: 120 mg via EPIDURAL

## 2011-07-16 MED ORDER — IOHEXOL 180 MG/ML  SOLN
1.0000 mL | Freq: Once | INTRAMUSCULAR | Status: AC | PRN
  Administered 2011-07-16: 1 mL via EPIDURAL

## 2011-07-16 NOTE — Patient Instructions (Signed)

## 2011-12-02 ENCOUNTER — Other Ambulatory Visit (HOSPITAL_COMMUNITY): Payer: Self-pay | Admitting: Family Medicine

## 2011-12-02 DIAGNOSIS — Z1231 Encounter for screening mammogram for malignant neoplasm of breast: Secondary | ICD-10-CM

## 2011-12-25 ENCOUNTER — Emergency Department (HOSPITAL_COMMUNITY): Payer: Self-pay

## 2011-12-25 ENCOUNTER — Encounter (HOSPITAL_COMMUNITY): Payer: Self-pay | Admitting: Emergency Medicine

## 2011-12-25 ENCOUNTER — Emergency Department (INDEPENDENT_AMBULATORY_CARE_PROVIDER_SITE_OTHER): Payer: Self-pay

## 2011-12-25 ENCOUNTER — Emergency Department (INDEPENDENT_AMBULATORY_CARE_PROVIDER_SITE_OTHER)
Admission: EM | Admit: 2011-12-25 | Discharge: 2011-12-25 | Disposition: A | Discharge status: Home / Self Care | Payer: Self-pay | Source: Home / Self Care

## 2011-12-25 ENCOUNTER — Ambulatory Visit (HOSPITAL_COMMUNITY)
Admission: RE | Admit: 2011-12-25 | Discharge: 2011-12-25 | Disposition: A | Discharge status: Home / Self Care | Payer: Self-pay | Source: Ambulatory Visit | Attending: Family Medicine | Admitting: Family Medicine

## 2011-12-25 DIAGNOSIS — T3370XA Superficial frostbite of unspecified knee and lower leg, initial encounter: Secondary | ICD-10-CM

## 2011-12-25 DIAGNOSIS — Z1231 Encounter for screening mammogram for malignant neoplasm of breast: Secondary | ICD-10-CM

## 2011-12-25 DIAGNOSIS — T3390XA Superficial frostbite of unspecified sites, initial encounter: Secondary | ICD-10-CM

## 2011-12-25 DIAGNOSIS — S8000XA Contusion of unspecified knee, initial encounter: Secondary | ICD-10-CM

## 2011-12-25 DIAGNOSIS — S8001XA Contusion of right knee, initial encounter: Secondary | ICD-10-CM

## 2011-12-25 HISTORY — DX: Essential (primary) hypertension: I10

## 2011-12-25 HISTORY — DX: Unspecified osteoarthritis, unspecified site: M19.90

## 2011-12-25 MED ORDER — SILVER SULFADIAZINE 1 % EX CREA: TOPICAL_CREAM | Freq: Every day | CUTANEOUS | Status: DC

## 2011-12-25 MED ORDER — SILVER SULFADIAZINE 1 % EX CREA: TOPICAL_CREAM | Freq: Once | CUTANEOUS | Status: DC

## 2011-12-25 MED ORDER — TRAMADOL HCL 50 MG PO TABS: 50.0000 mg | ORAL_TABLET | Freq: Four times a day (QID) | ORAL | Status: AC | PRN

## 2011-12-25 NOTE — ED Provider Notes (Signed)
History     CSN: [id number]  Arrival date & time 12/25/11  4098   None     Chief Complaint  Patient presents with  . Knee Pain    (Consider location/radiation/quality/duration/timing/severity/associated sxs/prior treatment) HPI Comments: Patient presents today with complaints of a right knee injury. She states that she was at [employer] yesterday when she tripped over a stand and fell to the floor landing on her right knee. She had onset of immediate pain with her fall and a burning sensation. She states her knee pain worsens with bending her knee. No increase in pain with weight bearing. When she went home she put an ice pack on her knee. She has since developed redness and blisters on her knee. She reports a history of degenerative joint disease, including her knees.   Past Medical History  Diagnosis Date  . Hypertension   . Diabetes mellitus   . Arthritis     Past Surgical History  Procedure Date  . Abdominal hysterectomy     History reviewed. No pertinent family history.  History  Substance Use Topics  . Smoking status: Never Smoker   . Smokeless tobacco: Never Used  . Alcohol Use: No    OB History    Grav Para Term Preterm Abortions TAB SAB Ect Mult Living                  Review of Systems  Constitutional: Negative for fever.  Musculoskeletal: Negative for joint swelling.  Skin: Positive for color change.  Neurological: Negative for dizziness, numbness and headaches.    Allergies  Review of patient's allergies indicates no known allergies.  Home Medications   Current Outpatient Rx  Name Route Sig Dispense Refill  . AMLODIPINE BESYLATE 10 MG PO TABS Oral Take 10 mg by mouth daily.    . AZILSARTAN-CHLORTHALIDONE 40-25 MG PO TABS Oral Take 40 mg by mouth daily.    [name] CARBAMAZEPINE 200 MG PO TABS Oral Take 200 mg by mouth 2 (two) times daily.      . MELOXICAM 7.5 MG PO TABS Oral Take 7.5 mg by mouth daily.      [name] METFORMIN HCL 500 MG PO TABS Oral  Take 500 mg by mouth 2 (two) times daily with a meal.    . METOPROLOL TARTRATE 25 MG PO TABS  Take 1/2 tablet by mouth two times a day     . LISINOPRIL-HYDROCHLOROTHIAZIDE 20-25 MG PO TABS Oral Take 1 tablet by mouth daily. for blood pressure     . SILVER SULFADIAZINE 1 % EX CREA Topical Apply topically daily. 50 g 0  . TRAMADOL HCL 50 MG PO TABS Oral Take 1 tablet (50 mg total) by mouth every 6 (six) hours as needed for pain. 12 tablet 0    BP 152/88  Pulse 76  Temp(Src) 97.9 F (36.6 C) (Oral)  Resp 16  SpO2 97%  Physical Exam  Nursing note and vitals reviewed. Constitutional: She appears well-developed and well-nourished. No distress.  HENT:  Head: Normocephalic and atraumatic.  Musculoskeletal: Normal range of motion.       Right knee: She exhibits normal range of motion, no swelling, no effusion, no ecchymosis, no laceration, normal alignment, no LCL laxity, normal patellar mobility and no MCL laxity. tenderness (very TTP patella) found. Medial joint line (mild) tenderness noted. No lateral joint line, no MCL, no LCL and no patellar tendon tenderness noted.  Neurological: She is alert.  Skin: Skin is warm and dry.  There is erythema.     Psychiatric: She has a normal mood and affect.    ED Course  Procedures (including critical care time)  Labs Reviewed - No data to display Dg Knee Complete 4 Views Right  12/25/2011  *RADIOLOGY REPORT*  Clinical Data: Right knee pain  RIGHT KNEE - COMPLETE 4+ VIEW  Comparison: None.  Findings: No fracture or dislocation is seen.  Moderate tricompartmental degenerative changes, most prominent in the patellofemoral compartment.  Possible mild prepatellar soft tissue swelling.  No suprapatellar knee joint effusion.  IMPRESSION: No fracture or dislocation is seen.  Possible mild prepatellar soft tissue swelling.  Moderate tricompartmental degenerative changes.a  Original Report Authenticated By: Charline Bills, M.D.     1. Contusion of knee,  right   2. Superficial frostbite of knee or lower leg       MDM  Xray reviewed by myself and radiologist.         Melody Comas, PA 12/25/11 1330

## 2011-12-25 NOTE — ED Notes (Addendum)
Patient fell yesterday morning at [employer] while paying for her bill. She tripped over one of the stands and slid over a wooden floor. Her knee was burning and she put an ice pack over it. She noticed the blisters last night. 4-5 noted blisters on the right knee and she's sore on the right arm. Pt's right knee and calf are swollen. Circulation is good on both feet. Limited ROM in right leg and arm. No pain in left leg and arm.

## 2011-12-25 NOTE — Discharge Instructions (Signed)
Wash your right knee with mild soap and water once a day. Then apply the prescription silvadene cream and apply a clean dressing. Increase your meloxicam to twice a day for one week. You may use ice packs 3-4 times a day for 15-20 minutes only if needed for discomfort. Do not put ice packs directly on your skin. This should be put over clothing or a towel. Continue your knee brace. Follow up with your primary care dr on Monday for a recheck.    Frostbite When your skin is exposed to cold for a long period of time, the underlying tissues may freeze and suffer permanent damage. This condition is called frostbite. Commonly affected areas include the feet, hands (especially the fingers), nose, and ears. People with poor circulation, the elderly, people with poorly controlled diabetes, and people with alcoholism are at greater risk for frostbite. Frostbite ranges in severity from mild and reversible to severe with possible permanent tissue death and loss. DIAGNOSIS Frostbite is diagnosed when areas of the skin appear gray or pale and feel hard. There may be a lack of feeling in the area, or there may be severe pain. As the frostbitten part warms, a red color may return to the area, and there is usually increased pain. TREATMENT  When frostbite is present there may also be hypothermia. This is a drop in the temperature of the entire body. It is important to check the temperature and make sure it is normal (98.6 F [37 C]). It is important to warm frostbitten parts as soon as possible to avoid permanent damage. The following steps can be taken to help:  Get out of the cold. Warm hands in your armpits. Cover the nose and ears with gloved hands. Removing the person from a cold environment is the number one priority, if possible.   Do not rub affected areas or use them. Do not walk on frostbitten feet unless it is necessary to get to a warm place.   If emergency help is not available, the frostbitten parts  may be submerged in lukewarm water. The water should not be hot.   If pain occurs with warming, this is a good sign. It means circulation is returning. If there is no pain or lack of feeling remains, it is necessary to seek immediate medical help.   After you warm the affected area it is very important that this area is not re-exposed to cold. Stay out of the cold.   Do not smoke. Smoking reduces your circulation.  PREVENTION  Wear warm clothing in layers, starting with long underwear and adding additional loose-fitting garments.   Wear mittens. Mittens lock in air, and the fingers help warm each other.   Wear 2 pairs of socks. Wear cotton socks next to the skin and wool socks over the cotton socks.   Wear waterproof boots or shoes that are not tight-fitting.   Wear a pull down cap or hood covering the head, ears, neck, and face if necessary.   Avoid alcohol. This increases heat loss and increases chances of frostbite.   Avoid smoking. Nicotine causes blood vessels to constrict in your hands and feet.   Pack food when you plan to be in the cold. Increasing your caloric intake to a high level increases your internal heat.   Carry extra blankets and clothing in the car during cold weather.   Be aware of the wind chill factor and dress accordingly. Wind is an important factor that can increase the risk  of frostbite. Avoid high winds when low temperatures are present.   Always avoid situations where it is impossible to get out of the cold if needed.   At the first signs of frostbite (numbness, pain, extreme redness), you should immediately get out of the cold.  HOME CARE INSTRUCTIONS   If bandages (dressings) were applied, change them as directed.   Follow up with your caregiver. Keep any appointments with specialists as directed. Failure to follow up could result in infection, loss of tissue, and chronic pain or disability.  SEEK IMMEDIATE MEDICAL CARE IF:   You have a fever.    You develop redness, lose tissue, or there is yellowish-white fluid (pus) coming from the frostbitten area.   You develop increasing pain in the frostbitten area.   The frostbitten area begins to turn a dark color.   You see a red streak going away from the frostbitten area.  MAKE SURE YOU:   Understand these instructions.   Will watch your condition.   Will get help right away if you are not doing well or get worse.  Document Released: 12/29/2000 Document Revised: 09/11/2011 Document Reviewed: 03/13/2011 [facility] Patient Information 2012 [place], [place]. Frostbite When your skin is exposed to cold for a long period of time, the underlying tissues may freeze and suffer permanent damage. This condition is called frostbite. Commonly affected areas include the feet, hands (especially the fingers), nose, and ears. People with poor circulation, the elderly, people with poorly controlled diabetes, and people with alcoholism are at greater risk for frostbite. Frostbite ranges in severity from mild and reversible to severe with possible permanent tissue death and loss. DIAGNOSIS Frostbite is diagnosed when areas of the skin appear gray or pale and feel hard. There may be a lack of feeling in the area, or there may be severe pain. As the frostbitten part warms, a red color may return to the area, and there is usually increased pain. TREATMENT  When frostbite is present there may also be hypothermia. This is a drop in the temperature of the entire body. It is important to check the temperature and make sure it is normal (98.6 F [37 C]). It is important to warm frostbitten parts as soon as possible to avoid permanent damage. The following steps can be taken to help:  Get out of the cold. Warm hands in your armpits. Cover the nose and ears with gloved hands. Removing the person from a cold environment is the number one priority, if possible.   Do not rub affected areas or use them. Do not walk on  frostbitten feet unless it is necessary to get to a warm place.   If emergency help is not available, the frostbitten parts may be submerged in lukewarm water. The water should not be hot.   If pain occurs with warming, this is a good sign. It means circulation is returning. If there is no pain or lack of feeling remains, it is necessary to seek immediate medical help.   After you warm the affected area it is very important that this area is not re-exposed to cold. Stay out of the cold.   Do not smoke. Smoking reduces your circulation.  PREVENTION  Wear warm clothing in layers, starting with long underwear and adding additional loose-fitting garments.   Wear mittens. Mittens lock in air, and the fingers help warm each other.   Wear 2 pairs of socks. Wear cotton socks next to the skin and wool socks over the cotton  socks.   Wear waterproof boots or shoes that are not tight-fitting.   Wear a pull down cap or hood covering the head, ears, neck, and face if necessary.   Avoid alcohol. This increases heat loss and increases chances of frostbite.   Avoid smoking. Nicotine causes blood vessels to constrict in your hands and feet.   Pack food when you plan to be in the cold. Increasing your caloric intake to a high level increases your internal heat.   Carry extra blankets and clothing in the car during cold weather.   Be aware of the wind chill factor and dress accordingly. Wind is an important factor that can increase the risk of frostbite. Avoid high winds when low temperatures are present.   Always avoid situations where it is impossible to get out of the cold if needed.   At the first signs of frostbite (numbness, pain, extreme redness), you should immediately get out of the cold.  HOME CARE INSTRUCTIONS   If bandages (dressings) were applied, change them as directed.   Follow up with your caregiver. Keep any appointments with specialists as directed. Failure to follow up could  result in infection, loss of tissue, and chronic pain or disability.  SEEK IMMEDIATE MEDICAL CARE IF:   You have a fever.   You develop redness, lose tissue, or there is yellowish-white fluid (pus) coming from the frostbitten area.   You develop increasing pain in the frostbitten area.   The frostbitten area begins to turn a dark color.   You see a red streak going away from the frostbitten area.  MAKE SURE YOU:   Understand these instructions.   Will watch your condition.   Will get help right away if you are not doing well or get worse.  Document Released: 12/29/2000 Document Revised: 09/11/2011 Document Reviewed: 03/13/2011 [facility] Patient Information 2012 [place], [place].

## 2011-12-25 NOTE — ED Notes (Signed)
Patient fell yesterday morning at [employer] while paying for her bill. She tripped over one of the stands and slid over a wooden floor. Her knee was burning and she put an ice pack over it. She noticed the blisters last night. 4-5 noted blisters on the right knee and she's sore on the right arm. Pt's right knee and calf are swollen. Circulation is good on both feet. Limited ROM in right leg and arm. No pain in left leg and arm. 

## 2012-01-05 NOTE — ED Provider Notes (Signed)
Medical screening examination/treatment/procedure(s) were performed by non-physician practitioner and as supervising physician I was immediately available for consultation/collaboration.  Jarelly Rinck G  D.O.    Deontre Allsup G Isatou Agredano, MD 01/05/12 0815 

## 2012-02-29 ENCOUNTER — Emergency Department (HOSPITAL_COMMUNITY)
Admission: EM | Admit: 2012-02-29 | Discharge: 2012-02-29 | Disposition: A | Discharge status: Home / Self Care | Payer: Self-pay | Attending: Emergency Medicine | Admitting: Emergency Medicine

## 2012-02-29 ENCOUNTER — Emergency Department (HOSPITAL_COMMUNITY): Payer: Self-pay

## 2012-02-29 ENCOUNTER — Encounter (HOSPITAL_COMMUNITY): Payer: Self-pay | Admitting: Family Medicine

## 2012-02-29 DIAGNOSIS — R0602 Shortness of breath: Secondary | ICD-10-CM | POA: Insufficient documentation

## 2012-02-29 DIAGNOSIS — I10 Essential (primary) hypertension: Secondary | ICD-10-CM | POA: Insufficient documentation

## 2012-02-29 DIAGNOSIS — R079 Chest pain, unspecified: Secondary | ICD-10-CM | POA: Insufficient documentation

## 2012-02-29 DIAGNOSIS — E119 Type 2 diabetes mellitus without complications: Secondary | ICD-10-CM | POA: Insufficient documentation

## 2012-02-29 DIAGNOSIS — K219 Gastro-esophageal reflux disease without esophagitis: Secondary | ICD-10-CM | POA: Insufficient documentation

## 2012-02-29 LAB — TROPONIN I
Troponin I: <0.3 ng/mL (ref <0.30)
Troponin I: <0.3 ng/mL (ref <0.30)

## 2012-02-29 LAB — COMPREHENSIVE METABOLIC PANEL
ALT: 52 U/L — ABNORMAL HIGH (ref 0–35)
AST: 45 U/L — ABNORMAL HIGH (ref 0–37)
Albumin: 4.2 g/dL (ref 3.5–5.2)
Alkaline Phosphatase: 69 U/L (ref 39–117)
BUN: 15 mg/dL (ref 6–23)
CO2: 28 mEq/L (ref 19–32)
Calcium: 10.3 mg/dL (ref 8.4–10.5)
Chloride: 98 mEq/L (ref 96–112)
Creatinine, Ser: 0.8 mg/dL (ref 0.50–1.10)
GFR calc Af Amer: 88 mL/min — ABNORMAL LOW (ref >90)
GFR calc non Af Amer: 76 mL/min — ABNORMAL LOW (ref >90)
Glucose, Bld: 132 mg/dL — ABNORMAL HIGH (ref 70–99)
Potassium: 3.6 mEq/L (ref 3.5–5.1)
Sodium: 137 mEq/L (ref 135–145)
Total Bilirubin: 0.3 mg/dL (ref 0.3–1.2)
Total Protein: 9.2 g/dL — ABNORMAL HIGH (ref 6.0–8.3)

## 2012-02-29 LAB — CBC
HCT: 37.5 % (ref 36.0–46.0)
Hemoglobin: 12.9 g/dL (ref 12.0–15.0)
MCH: 30.8 pg (ref 26.0–34.0)
MCHC: 34.4 g/dL (ref 30.0–36.0)
MCV: 89.5 fL (ref 78.0–100.0)
Platelets: 262 10*3/uL (ref 150–400)
RBC: 4.19 MIL/uL (ref 3.87–5.11)
RDW: 12.1 % (ref 11.5–15.5)
WBC: 6.2 10*3/uL (ref 4.0–10.5)

## 2012-02-29 LAB — GLUCOSE, CAPILLARY: Glucose-Capillary: 178 mg/dL — ABNORMAL HIGH (ref 70–99)

## 2012-02-29 MED ORDER — ASPIRIN 325 MG PO TABS
325.0000 mg | ORAL_TABLET | Freq: Once | ORAL | Status: AC
  Administered 2012-02-29: 325 mg via ORAL
  Filled 2012-02-29: qty 1

## 2012-02-29 MED ORDER — OMEPRAZOLE 20 MG PO CPDR: 20.0000 mg | DELAYED_RELEASE_CAPSULE | Freq: Every day | ORAL | Status: DC

## 2012-02-29 NOTE — Discharge Instructions (Signed)
Chest Pain (Nonspecific) It is often hard to give a specific diagnosis for the cause of chest pain. There is always a chance that your pain could be related to something serious, such as a heart attack or a blood clot in the lungs. You need to follow up with your caregiver for further evaluation. CAUSES   Heartburn.   Pneumonia or bronchitis.   Anxiety or stress.   Inflammation around your heart (pericarditis) or lung (pleuritis or pleurisy).   A blood clot in the lung.   A collapsed lung (pneumothorax). It can develop suddenly on its own (spontaneous pneumothorax) or from injury (trauma) to the chest.   Shingles infection (herpes zoster virus).  The chest wall is composed of bones, muscles, and cartilage. Any of these can be the source of the pain.  The bones can be bruised by injury.   The muscles or cartilage can be strained by coughing or overwork.   The cartilage can be affected by inflammation and become sore (costochondritis).  DIAGNOSIS  Lab tests or other studies, such as X-rays, electrocardiography, stress testing, or cardiac imaging, may be needed to find the cause of your pain.  TREATMENT   Treatment depends on what may be causing your chest pain. Treatment may include:   Acid blockers for heartburn.   Anti-inflammatory medicine.   Pain medicine for inflammatory conditions.   Antibiotics if an infection is present.   You may be advised to change lifestyle habits. This includes stopping smoking and avoiding alcohol, caffeine, and chocolate.   You may be advised to keep your head raised (elevated) when sleeping. This reduces the chance of acid going backward from your stomach into your esophagus.   Most of the time, nonspecific chest pain will improve within 2 to 3 days with rest and mild pain medicine.  HOME CARE INSTRUCTIONS   If antibiotics were prescribed, take your antibiotics as directed. Finish them even if you start to feel better.   For the next few  days, avoid physical activities that bring on chest pain. Continue physical activities as directed.   Do not smoke.   Avoid drinking alcohol.   Only take over-the-counter or prescription medicine for pain, discomfort, or fever as directed by your caregiver.   Follow your caregiver's suggestions for further testing if your chest pain does not go away.   Keep any follow-up appointments you made. If you do not go to an appointment, you could develop lasting (chronic) problems with pain. If there is any problem keeping an appointment, you must call to reschedule.  SEEK MEDICAL CARE IF:   You think you are having problems from the medicine you are taking. Read your medicine instructions carefully.   Your chest pain does not go away, even after treatment.   You develop a rash with blisters on your chest.  SEEK IMMEDIATE MEDICAL CARE IF:   You have increased chest pain or pain that spreads to your arm, neck, jaw, back, or abdomen.   You develop shortness of breath, an increasing cough, or you are coughing up blood.   You have severe back or abdominal pain, feel nauseous, or vomit.   You develop severe weakness, fainting, or chills.   You have a fever.  THIS IS AN EMERGENCY. Do not wait to see if the pain will go away. Get medical help at once. Call your local emergency services (911 in [place] Do not drive yourself to the hospital. MAKE SURE YOU:   Understand these instructions.     Will watch your condition.   Will get help right away if you are not doing well or get worse.  Document Released: 07/02/2005 Document Revised: 09/11/2011 Document Reviewed: 04/27/2008 [employer] Patient Information 2012 [employer]. 

## 2012-02-29 NOTE — ED Notes (Signed)
Pt sts feels like choking in throat since last night. Hx of reflux. sts has had before and takes zantac. Saw her Dr. Tuesday

## 2012-02-29 NOTE — ED Provider Notes (Signed)
History   This chart was scribed for Lyanne Co, MD by Toya Smothers. The patient was seen in room STRE7/STRE7. Patient's care was started at 1424.  CSN: [id number]  Arrival date & time 02/29/12  1424   First MD Initiated Contact with Patient 02/29/12 1623      Chief Complaint  Patient presents with  . Gastrophageal Reflux    HPI  Cathy Gonzalez is a 66 y.o. female who presents to the Emergency Department complaining of chest pain that began approximately one month ago.  Her symptoms are intermittent.  They're worse when she eats.  Last night she reports she felt a burning they came up into her throat and made her feel like she was choking.  She reports occasional shortness of breath but she denies shortness of breath every time.  She saw her primary care doctor several days ago regarding the symptoms.  Her symptoms are not exacerbated by exertion.  She has no unilateral leg swelling.  She has no prior history of pulmonary embolism or DVT.  She does have a history of hypertension diabetes.  She denies cigarette history.  She denies early family history of heart disease.  She denies heart disease in all of her family members.  At this time her discomfort is not present    Past Medical History  Diagnosis Date  . Hypertension   . Diabetes mellitus   . Arthritis     Past Surgical History  Procedure Date  . Abdominal hysterectomy     History reviewed. No pertinent family history.  History  Substance Use Topics  . Smoking status: Never Smoker   . Smokeless tobacco: Never Used  . Alcohol Use: No    Review of Systems  Constitutional: Negative for fever and chills.  HENT: Negative for neck pain.   Respiratory: Positive for shortness of breath.        Chest discomfort  Cardiovascular: Positive for chest pain.  Gastrointestinal: Negative for nausea and vomiting.       Gastroesophageal discomfort  Neurological: Negative for weakness.  All other systems reviewed and are  negative.  A complete 10 system review of systems was obtained and all systems are negative except as noted in the HPI and PMH.    Allergies  Review of patient's allergies indicates no known allergies.  Home Medications   Current Outpatient Rx  Name Route Sig Dispense Refill  . AMLODIPINE BESYLATE 10 MG PO TABS Oral Take 10 mg by mouth daily.    . AZILSARTAN-CHLORTHALIDONE 40-25 MG PO TABS Oral Take 40 mg by mouth daily.    [name] CARBAMAZEPINE 200 MG PO TABS Oral Take 200 mg by mouth 2 (two) times daily.      . MELOXICAM 15 MG PO TABS Oral Take 15 mg by mouth daily.    [name] METFORMIN HCL 500 MG PO TABS Oral Take 500 mg by mouth at bedtime.     [name] RANITIDINE HCL 150 MG PO TABS Oral Take 300 mg by mouth daily.      BP 148/97  Pulse 102  Temp(Src) 98.5 F (36.9 C) (Oral)  Resp 16  SpO2 98%  Physical Exam  Nursing note and vitals reviewed. Constitutional: She is oriented to person, place, and time. She appears well-developed and well-nourished. No distress.  HENT:  Head: Normocephalic and atraumatic.  Eyes: EOM are normal.  Neck: Neck supple. No tracheal deviation present.  Cardiovascular: Normal rate, regular rhythm, normal heart sounds and intact distal pulses.  Exam  reveals no gallop and no friction rub.   No murmur heard. Pulmonary/Chest: Effort normal. No respiratory distress. She has no wheezes. She has no rales.  Musculoskeletal: Normal range of motion. She exhibits no edema and no tenderness.  Neurological: She is alert and oriented to person, place, and time.  Skin: Skin is warm and dry.  Psychiatric: She has a normal mood and affect. Her behavior is normal.    ED Course  Procedures (including critical care time)  DIAGNOSTIC STUDIES: Oxygen Saturation is 98% on room air, normal by my interpretation.    COORDINATION OF CARE:   Date: 02/29/2012  Rate: 95  Rhythm: normal sinus rhythm  QRS Axis: normal  Intervals: normal  ST/T Wave abnormalities: normal  Conduction  Disutrbances: none  Narrative Interpretation:   Old EKG Reviewed: No significant changes noted     Labs Reviewed  GLUCOSE, CAPILLARY - Abnormal; Notable for the following:    Glucose-Capillary 178 (*)    All other components within normal limits  COMPREHENSIVE METABOLIC PANEL - Abnormal; Notable for the following:    Glucose, Bld 132 (*)    Total Protein 9.2 (*)    AST 45 (*)    ALT 52 (*)    GFR calc non Af Amer 76 (*)    GFR calc Af Amer 88 (*)    All other components within normal limits  CBC  TROPONIN I  TROPONIN I   Dg Chest 2 View  02/29/2012  *RADIOLOGY REPORT*  Clinical Data: Chest pain, indigestion, weakness.  History of hypertension, diabetes, sleep apnea.  CHEST - 2 VIEW  Comparison: 05/05/2011  Findings: Cardiomediastinal silhouette is within normal limits. The lungs are free of focal consolidations and pleural effusions. No edema.  There are mild degenerative changes in the mid thoracic spine.  IMPRESSION: No evidence for acute cardiopulmonary abnormality.  Original Report Authenticated By: Patterson Hammersmith, M.D.     1. Chest pain      MDM  I suspect this patient's symptoms are more related to gastroesophageal reflux disease.  EKG has no ST changes.  I did obtain 2 sets of cardiac enzymes as the patient does have cardiac risk factors that include hypertension diabetes.  He's negative in the emergency department.  I feel that she is stable for discharge home.  I do think she is followup with the cardiologist as outpatient I've given at this contact information.  She understands the importance of close followup with her PCP with a cardiologist and understands to return the emergency department for new or worsening symptoms my suspicion for ACS is very low   I personally performed the services described in this documentation, which was scribed in my presence. The recorded information has been reviewed and considered.         Lyanne Co, MD 02/29/12 2006

## 2012-07-13 ENCOUNTER — Encounter (HOSPITAL_COMMUNITY): Payer: Self-pay | Admitting: *Deleted

## 2012-07-13 ENCOUNTER — Emergency Department (HOSPITAL_COMMUNITY): Payer: Self-pay

## 2012-07-13 ENCOUNTER — Emergency Department (HOSPITAL_COMMUNITY)
Admission: EM | Admit: 2012-07-13 | Discharge: 2012-07-13 | Disposition: A | Discharge status: Home Health | Payer: Self-pay | Attending: Emergency Medicine | Admitting: Emergency Medicine

## 2012-07-13 DIAGNOSIS — R42 Dizziness and giddiness: Secondary | ICD-10-CM | POA: Insufficient documentation

## 2012-07-13 DIAGNOSIS — I10 Essential (primary) hypertension: Secondary | ICD-10-CM | POA: Insufficient documentation

## 2012-07-13 DIAGNOSIS — E119 Type 2 diabetes mellitus without complications: Secondary | ICD-10-CM | POA: Insufficient documentation

## 2012-07-13 DIAGNOSIS — R002 Palpitations: Secondary | ICD-10-CM | POA: Insufficient documentation

## 2012-07-13 DIAGNOSIS — Z79899 Other long term (current) drug therapy: Secondary | ICD-10-CM | POA: Insufficient documentation

## 2012-07-13 DIAGNOSIS — Z8739 Personal history of other diseases of the musculoskeletal system and connective tissue: Secondary | ICD-10-CM | POA: Insufficient documentation

## 2012-07-13 LAB — CBC WITH DIFFERENTIAL/PLATELET
Basophils Absolute: 0 10*3/uL (ref 0.0–0.1)
Basophils Relative: 0 % (ref 0–1)
Eosinophils Absolute: 0.1 10*3/uL (ref 0.0–0.7)
Eosinophils Relative: 1 % (ref 0–5)
HCT: 35.7 % — ABNORMAL LOW (ref 36.0–46.0)
Hemoglobin: 12.5 g/dL (ref 12.0–15.0)
Lymphocytes Relative: 35 % (ref 12–46)
Lymphs Abs: 2.5 10*3/uL (ref 0.7–4.0)
MCH: 31 pg (ref 26.0–34.0)
MCHC: 35 g/dL (ref 30.0–36.0)
MCV: 88.6 fL (ref 78.0–100.0)
Monocytes Absolute: 0.6 10*3/uL (ref 0.1–1.0)
Monocytes Relative: 9 % (ref 3–12)
Neutro Abs: 3.9 10*3/uL (ref 1.7–7.7)
Neutrophils Relative %: 55 % (ref 43–77)
Platelets: 250 10*3/uL (ref 150–400)
RBC: 4.03 MIL/uL (ref 3.87–5.11)
RDW: 12 % (ref 11.5–15.5)
WBC: 7.1 10*3/uL (ref 4.0–10.5)

## 2012-07-13 LAB — COMPREHENSIVE METABOLIC PANEL
ALT: 40 U/L — ABNORMAL HIGH (ref 0–35)
AST: 40 U/L — ABNORMAL HIGH (ref 0–37)
Albumin: 3.9 g/dL (ref 3.5–5.2)
Alkaline Phosphatase: 72 U/L (ref 39–117)
BUN: 15 mg/dL (ref 6–23)
CO2: 27 mEq/L (ref 19–32)
Calcium: 10.2 mg/dL (ref 8.4–10.5)
Chloride: 99 mEq/L (ref 96–112)
Creatinine, Ser: 0.91 mg/dL (ref 0.50–1.10)
GFR calc Af Amer: 76 mL/min — ABNORMAL LOW (ref >90)
GFR calc non Af Amer: 65 mL/min — ABNORMAL LOW (ref >90)
Glucose, Bld: 124 mg/dL — ABNORMAL HIGH (ref 70–99)
Potassium: 3.3 mEq/L — ABNORMAL LOW (ref 3.5–5.1)
Sodium: 138 mEq/L (ref 135–145)
Total Bilirubin: 0.3 mg/dL (ref 0.3–1.2)
Total Protein: 8.7 g/dL — ABNORMAL HIGH (ref 6.0–8.3)

## 2012-07-13 LAB — TROPONIN I
Troponin I: <0.3 ng/mL (ref <0.30)
Troponin I: <0.3 ng/mL (ref <0.30)

## 2012-07-13 MED ORDER — METOPROLOL TARTRATE 1 MG/ML IV SOLN
10.0000 mg | Freq: Once | INTRAVENOUS | Status: DC
  Filled 2012-07-13: qty 5

## 2012-07-13 MED ORDER — KETOROLAC TROMETHAMINE 30 MG/ML IJ SOLN
30.0000 mg | Freq: Once | INTRAMUSCULAR | Status: AC
  Administered 2012-07-13: 30 mg via INTRAVENOUS
  Filled 2012-07-13: qty 1

## 2012-07-13 MED ORDER — AMLODIPINE BESYLATE 10 MG PO TABS
10.0000 mg | ORAL_TABLET | Freq: Once | ORAL | Status: AC
  Administered 2012-07-13: 10 mg via ORAL
  Filled 2012-07-13: qty 1

## 2012-07-13 MED ORDER — SODIUM CHLORIDE 0.9 % IV BOLUS (SEPSIS)
1000.0000 mL | Freq: Once | INTRAVENOUS | Status: AC
  Administered 2012-07-13: 1000 mL via INTRAVENOUS

## 2012-07-13 MED ORDER — METOCLOPRAMIDE HCL 5 MG/ML IJ SOLN
10.0000 mg | Freq: Once | INTRAMUSCULAR | Status: AC
  Administered 2012-07-13: 10 mg via INTRAVENOUS
  Filled 2012-07-13: qty 2

## 2012-07-13 MED ORDER — POTASSIUM CHLORIDE CRYS ER 20 MEQ PO TBCR
40.0000 meq | EXTENDED_RELEASE_TABLET | Freq: Once | ORAL | Status: AC
  Administered 2012-07-13: 40 meq via ORAL
  Filled 2012-07-13: qty 2

## 2012-07-13 MED ORDER — CARBAMAZEPINE 200 MG PO TABS
200.0000 mg | ORAL_TABLET | Freq: Once | ORAL | Status: DC
  Filled 2012-07-13: qty 1

## 2012-07-13 NOTE — ED Notes (Signed)
The pt is c/o dizziness since yesterday she has seizures.  She is does not know if she had a seizure tonight or not

## 2012-07-13 NOTE — ED Provider Notes (Signed)
History     CSN: [id number]  Arrival date & time 07/13/12  0111   First MD Initiated Contact with Patient 07/13/12 0132      Chief Complaint  Patient presents with  . Dizziness    (Consider location/radiation/quality/duration/timing/severity/associated sxs/prior treatment) The history is provided by the patient.  Cathy Gonzalez is a 66 y.o. female hx of DM, seizures here with dizziness, palpitations. Around 7:30pm, she was watching TV and had an episode of feeling like she was going to pass out, then had palpitations. It lasted several minutes and then resolved. She denied any tongue biting or incontinence. No vertigo. When she went to bed, she then had a similar episode. She never passed out. She has hx of seizures and is on tegretol. She said that her seizures always happen at night and she would wake up tired. Cardiac risk factors include diabetes.    Past Medical History  Diagnosis Date  . Hypertension   . Diabetes mellitus   . Arthritis     Past Surgical History  Procedure Date  . Abdominal hysterectomy     No family history on file.  History  Substance Use Topics  . Smoking status: Never Smoker   . Smokeless tobacco: Never Used  . Alcohol Use: No    OB History    Grav Para Term Preterm Abortions TAB SAB Ect Mult Living                  Review of Systems  Cardiovascular: Positive for palpitations.  Neurological: Positive for dizziness and headaches.  All other systems reviewed and are negative.    Allergies  Review of patient's allergies indicates no known allergies.  Home Medications   Current Outpatient Rx  Name Route Sig Dispense Refill  . AMLODIPINE BESYLATE 10 MG PO TABS Oral Take 10 mg by mouth daily.    . AZILSARTAN-CHLORTHALIDONE 40-25 MG PO TABS Oral Take 40 mg by mouth daily.    [name] CARBAMAZEPINE 200 MG PO TABS Oral Take 200 mg by mouth 2 (two) times daily.      . CORICIDIN HBP COUGH/COLD PO Oral Take 1 tablet by mouth once.    . MELOXICAM 15  MG PO TABS Oral Take 7.5 mg by mouth daily.     [name] METFORMIN HCL 500 MG PO TABS Oral Take 500 mg by mouth at bedtime.     [name] RANITIDINE HCL 150 MG PO TABS Oral Take 300 mg by mouth daily.      BP 138/86  Pulse 81  Temp 98.2 F (36.8 C) (Oral)  Resp 18  SpO2 98%  Physical Exam  Nursing note and vitals reviewed. Constitutional: She is oriented to person, place, and time. She appears well-developed and well-nourished. No distress.  HENT:  Head: Normocephalic and atraumatic.  Mouth/Throat: Oropharynx is clear and moist.  Eyes: Conjunctivae normal are normal. Pupils are equal, round, and reactive to light.       No nystagmus   Neck: Normal range of motion. Neck supple.  Cardiovascular: Normal rate, regular rhythm and normal heart sounds.   Pulmonary/Chest: Effort normal and breath sounds normal. No respiratory distress. She has no wheezes. She has no rales.  Abdominal: Soft. Bowel sounds are normal. She exhibits no distension. There is no tenderness.  Musculoskeletal: Normal range of motion. She exhibits no edema and no tenderness.  Neurological: She is alert and oriented to person, place, and time. No cranial nerve deficit. Coordination normal.  Skin: Skin is warm and  dry.  Psychiatric: She has a normal mood and affect. Her behavior is normal. Judgment and thought content normal.    ED Course  Procedures (including critical care time)  Labs Reviewed  CBC WITH DIFFERENTIAL - Abnormal; Notable for the following:    HCT 35.7 (*)     All other components within normal limits  COMPREHENSIVE METABOLIC PANEL - Abnormal; Notable for the following:    Potassium 3.3 (*)     Glucose, Bld 124 (*)     Total Protein 8.7 (*)     AST 40 (*)     ALT 40 (*)     GFR calc non Af Amer 65 (*)     GFR calc Af Amer 76 (*)     All other components within normal limits  TROPONIN I  TROPONIN I   Ct Head Wo Contrast  07/13/2012  *RADIOLOGY REPORT*  Clinical Data: 66 year old female with dizzy spells.   CT HEAD WITHOUT CONTRAST  Technique:  Contiguous axial images were obtained from the base of the skull through the vertex without contrast.  Comparison: None.  Findings: Visualized paranasal sinuses and mastoids are clear.  No acute osseous abnormality identified.  Visualized orbits and scalp soft tissues are within normal limits.  Normal cerebral volume.  No ventriculomegaly.  Incidental cavum septum pellucidum.  Gray-white matter differentiation within normal limits for age. No midline shift, mass effect, or evidence of mass lesion.  No evidence of cortically based acute infarction identified.  No acute intracranial hemorrhage identified.  No suspicious intracranial vascular hyperdensity.  IMPRESSION: Normal for age noncontrast CT appearance of the brain.   Original Report Authenticated By: Harley Hallmark, M.D.      1. Dizziness   2. Palpitations      Date: 07/13/2012  Rate: 96  Rhythm: normal sinus rhythm  QRS Axis: normal  Intervals: normal  ST/T Wave abnormalities: normal and nonspecific ST changes  Conduction Disutrbances:none  Narrative Interpretation:   Old EKG Reviewed: unchanged    MDM  Cathy Gonzalez is a 66 y.o. female here with dizziness, headache. Will consider orthostatic hypotension vs atypical seizures. Less likely to have ACS. Will do orthostatics, CT head, labs, EKG and re-eval.   5:38 AM Labs nl except K 3.3 (supplemented). Trop neg x 2. CT head nl. Patient not orthostatic. She was noted to be hypertensive. I gave her Norvasc that she usually takes and her BP improved. Headache improved. I think its possible that she has more frequent seizures. I recommend increase tegretol to 400mg  twice a day but she said that she wasn't able to tolerate that in the past. She will take tegretol 300mg  twice a day and will follow up with her neurologist.        Richardean Canal, MD 07/13/12 [phone]

## 2012-07-13 NOTE — ED Notes (Signed)
Metoprolol held due to pt's BP, Dr Silverio Lay notified.

## 2012-12-13 ENCOUNTER — Other Ambulatory Visit (HOSPITAL_COMMUNITY): Payer: Self-pay | Admitting: Family Medicine

## 2013-01-05 ENCOUNTER — Other Ambulatory Visit (HOSPITAL_COMMUNITY): Payer: Self-pay | Admitting: Family Medicine

## 2013-01-05 DIAGNOSIS — Z1231 Encounter for screening mammogram for malignant neoplasm of breast: Secondary | ICD-10-CM

## 2013-01-10 ENCOUNTER — Ambulatory Visit (HOSPITAL_COMMUNITY)
Admission: RE | Admit: 2013-01-10 | Discharge: 2013-01-10 | Disposition: A | Discharge status: Home / Self Care | Payer: Medicare Other | Source: Ambulatory Visit | Attending: Family Medicine | Admitting: Family Medicine

## 2013-01-10 DIAGNOSIS — Z1231 Encounter for screening mammogram for malignant neoplasm of breast: Secondary | ICD-10-CM | POA: Insufficient documentation

## 2013-01-20 DIAGNOSIS — G4730 Sleep apnea, unspecified: Secondary | ICD-10-CM | POA: Diagnosis not present

## 2013-01-20 DIAGNOSIS — E119 Type 2 diabetes mellitus without complications: Secondary | ICD-10-CM | POA: Diagnosis not present

## 2013-01-20 DIAGNOSIS — E559 Vitamin D deficiency, unspecified: Secondary | ICD-10-CM | POA: Diagnosis not present

## 2013-01-20 DIAGNOSIS — I10 Essential (primary) hypertension: Secondary | ICD-10-CM | POA: Diagnosis not present

## 2013-01-20 DIAGNOSIS — E7800 Pure hypercholesterolemia, unspecified: Secondary | ICD-10-CM | POA: Diagnosis not present

## 2013-04-01 DIAGNOSIS — Z01419 Encounter for gynecological examination (general) (routine) without abnormal findings: Secondary | ICD-10-CM | POA: Insufficient documentation

## 2013-04-01 DIAGNOSIS — Z1151 Encounter for screening for human papillomavirus (HPV): Secondary | ICD-10-CM | POA: Insufficient documentation

## 2013-04-07 ENCOUNTER — Other Ambulatory Visit (HOSPITAL_COMMUNITY)
Admission: RE | Admit: 2013-04-07 | Discharge: 2013-04-07 | Disposition: A | Discharge status: Home / Self Care | Payer: Medicare Other | Source: Ambulatory Visit | Attending: Family Medicine | Admitting: Family Medicine

## 2013-11-24 ENCOUNTER — Other Ambulatory Visit: Payer: Self-pay | Admitting: Family Medicine

## 2013-11-24 ENCOUNTER — Ambulatory Visit
Admission: RE | Admit: 2013-11-24 | Discharge: 2013-11-24 | Disposition: A | Discharge status: Home / Self Care | Payer: Medicare Other | Source: Ambulatory Visit | Attending: Family Medicine | Admitting: Family Medicine

## 2013-11-24 DIAGNOSIS — T1490XA Injury, unspecified, initial encounter: Secondary | ICD-10-CM

## 2013-12-01 ENCOUNTER — Encounter (HOSPITAL_COMMUNITY): Payer: Self-pay | Admitting: Emergency Medicine

## 2013-12-01 ENCOUNTER — Emergency Department (HOSPITAL_COMMUNITY)
Admission: EM | Admit: 2013-12-01 | Discharge: 2013-12-01 | Disposition: A | Discharge status: Home / Self Care | Payer: Medicare Other | Attending: Emergency Medicine | Admitting: Emergency Medicine

## 2013-12-01 DIAGNOSIS — J019 Acute sinusitis, unspecified: Secondary | ICD-10-CM

## 2013-12-01 DIAGNOSIS — G8911 Acute pain due to trauma: Secondary | ICD-10-CM | POA: Insufficient documentation

## 2013-12-01 DIAGNOSIS — M129 Arthropathy, unspecified: Secondary | ICD-10-CM | POA: Insufficient documentation

## 2013-12-01 DIAGNOSIS — R0789 Other chest pain: Secondary | ICD-10-CM

## 2013-12-01 DIAGNOSIS — I10 Essential (primary) hypertension: Secondary | ICD-10-CM | POA: Insufficient documentation

## 2013-12-01 DIAGNOSIS — E119 Type 2 diabetes mellitus without complications: Secondary | ICD-10-CM | POA: Insufficient documentation

## 2013-12-01 DIAGNOSIS — Z791 Long term (current) use of non-steroidal anti-inflammatories (NSAID): Secondary | ICD-10-CM | POA: Insufficient documentation

## 2013-12-01 DIAGNOSIS — R071 Chest pain on breathing: Secondary | ICD-10-CM | POA: Insufficient documentation

## 2013-12-01 DIAGNOSIS — Z79899 Other long term (current) drug therapy: Secondary | ICD-10-CM | POA: Insufficient documentation

## 2013-12-01 LAB — CBC WITH DIFFERENTIAL/PLATELET
Basophils Absolute: 0 10*3/uL (ref 0.0–0.1)
Basophils Relative: 1 % (ref 0–1)
Eosinophils Absolute: 0.1 10*3/uL (ref 0.0–0.7)
Eosinophils Relative: 1 % (ref 0–5)
HCT: 34.8 % — ABNORMAL LOW (ref 36.0–46.0)
Hemoglobin: 12 g/dL (ref 12.0–15.0)
Lymphocytes Relative: 34 % (ref 12–46)
Lymphs Abs: 1.9 10*3/uL (ref 0.7–4.0)
MCH: 31.3 pg (ref 26.0–34.0)
MCHC: 34.5 g/dL (ref 30.0–36.0)
MCV: 90.6 fL (ref 78.0–100.0)
Monocytes Absolute: 0.6 10*3/uL (ref 0.1–1.0)
Monocytes Relative: 10 % (ref 3–12)
Neutro Abs: 3.1 10*3/uL (ref 1.7–7.7)
Neutrophils Relative %: 55 % (ref 43–77)
Platelets: 278 10*3/uL (ref 150–400)
RBC: 3.84 MIL/uL — ABNORMAL LOW (ref 3.87–5.11)
RDW: 12.4 % (ref 11.5–15.5)
WBC: 5.7 10*3/uL (ref 4.0–10.5)

## 2013-12-01 LAB — COMPREHENSIVE METABOLIC PANEL
ALT: 39 U/L — ABNORMAL HIGH (ref 0–35)
AST: 43 U/L — ABNORMAL HIGH (ref 0–37)
Albumin: 3.9 g/dL (ref 3.5–5.2)
Alkaline Phosphatase: 63 U/L (ref 39–117)
BUN: 20 mg/dL (ref 6–23)
CO2: 25 mEq/L (ref 19–32)
Calcium: 9.8 mg/dL (ref 8.4–10.5)
Chloride: 99 mEq/L (ref 96–112)
Creatinine, Ser: 0.94 mg/dL (ref 0.50–1.10)
GFR calc Af Amer: 72 mL/min — ABNORMAL LOW (ref >90)
GFR calc non Af Amer: 62 mL/min — ABNORMAL LOW (ref >90)
Glucose, Bld: 166 mg/dL — ABNORMAL HIGH (ref 70–99)
Potassium: 3.8 mEq/L (ref 3.7–5.3)
Sodium: 140 mEq/L (ref 137–147)
Total Bilirubin: 0.3 mg/dL (ref 0.3–1.2)
Total Protein: 9 g/dL — ABNORMAL HIGH (ref 6.0–8.3)

## 2013-12-01 LAB — URINALYSIS, ROUTINE W REFLEX MICROSCOPIC
Bilirubin Urine: NEGATIVE
Glucose, UA: NEGATIVE mg/dL
Hgb urine dipstick: NEGATIVE
Ketones, ur: NEGATIVE mg/dL
Leukocytes, UA: NEGATIVE
Nitrite: NEGATIVE
Protein, ur: NEGATIVE mg/dL
Specific Gravity, Urine: 1.022 (ref 1.005–1.030)
Urobilinogen, UA: 0.2 mg/dL (ref 0.0–1.0)
pH: 7 (ref 5.0–8.0)

## 2013-12-01 LAB — LIPASE, BLOOD: Lipase: 31 U/L (ref 11–59)

## 2013-12-01 MED ORDER — AZITHROMYCIN 250 MG PO TABS: ORAL_TABLET | ORAL | Status: DC

## 2013-12-01 MED ORDER — PANTOPRAZOLE SODIUM 20 MG PO TBEC: 20.0000 mg | DELAYED_RELEASE_TABLET | Freq: Two times a day (BID) | ORAL | Status: DC

## 2013-12-01 NOTE — ED Provider Notes (Signed)
CSN: [id number]     Arrival date & time 12/01/13  1119 History   First MD Initiated Contact with Patient 12/01/13 1123     Chief Complaint  Patient presents with  . Back Pain     (Consider location/radiation/quality/duration/timing/severity/associated sxs/prior Treatment) HPI Comments: Patient is a 68 year old female with past medical history of hypertension, diabetes, and arthritis. He presents today with complaints of discomfort in the left lateral chest wall. She fell approximately 2 weeks ago. She was seen by her primary Dr. and had x-rays performed. This revealed no pneumothorax or rib fracture. She states she has had discomfort in this area intermittently since that time. She denies any difficulty breathing. She denies any fevers or chills.  She also reports sinus drainage and "mucus in her throat". She was told to take Mucinex by her primary Dr. however this has not helped. He symptoms have been ongoing for approximately 3 weeks and are not improving.  Patient is a 68 y.o. female presenting with chest pain. The history is provided by the patient.  Chest Pain Pain location:  L chest Pain quality: sharp   Pain radiates to:  Does not radiate Pain radiates to the back: no   Pain severity:  Moderate Duration:  2 weeks Timing:  Intermittent Progression:  Worsening Chronicity:  New Context: trauma   Relieved by:  Nothing Worsened by:  Movement   Past Medical History  Diagnosis Date  . Hypertension   . Diabetes mellitus   . Arthritis    Past Surgical History  Procedure Laterality Date  . Abdominal hysterectomy     History reviewed. No pertinent family history. History  Substance Use Topics  . Smoking status: Never Smoker   . Smokeless tobacco: Never Used  . Alcohol Use: No   OB History   Grav Para Term Preterm Abortions TAB SAB Ect Mult Living                 Review of Systems  Cardiovascular: Positive for chest pain.  All other systems reviewed and are  negative.      Allergies  Review of patient's allergies indicates no known allergies.  Home Medications   Current Outpatient Rx  Name  Route  Sig  Dispense  Refill  . amLODipine (NORVASC) 10 MG tablet   Oral   Take 10 mg by mouth daily.         . Azilsartan-Chlorthalidone (EDARBYCLOR) 40-25 MG TABS   Oral   Take 40 mg by mouth daily.         . carbamazepine (TEGRETOL) 200 MG tablet   Oral   Take 200 mg by mouth 2 (two) times daily.           . Chlorpheniramine-DM (CORICIDIN HBP COUGH/COLD PO)   Oral   Take 1 tablet by mouth once.         . meloxicam (MOBIC) 15 MG tablet   Oral   Take 7.5 mg by mouth daily.          . metFORMIN (GLUCOPHAGE) 500 MG tablet   Oral   Take 500 mg by mouth at bedtime.          . ranitidine (ZANTAC) 150 MG tablet   Oral   Take 300 mg by mouth daily.          There were no vitals taken for this visit. Physical Exam  Nursing note and vitals reviewed. Constitutional: She is oriented to person, place, and time. She appears well-developed  and well-nourished. No distress.  HENT:  Head: Normocephalic and atraumatic.  Mouth/Throat: Oropharynx is clear and moist.  TMs are clear bilaterally. There is mild maxillofacial tenderness to palpation.  Neck: Normal range of motion. Neck supple.  Cardiovascular: Normal rate and regular rhythm.  Exam reveals no gallop and no friction rub.   No murmur heard. Pulmonary/Chest: Effort normal and breath sounds normal. No respiratory distress. She has no wheezes.  Abdominal: Soft. Bowel sounds are normal. She exhibits no distension. There is no tenderness.  Musculoskeletal: Normal range of motion.  Neurological: She is alert and oriented to person, place, and time.  Skin: Skin is warm and dry. She is not diaphoretic.    ED Course  Procedures (including critical care time) Labs Review Labs Reviewed  CBC WITH DIFFERENTIAL  COMPREHENSIVE METABOLIC PANEL  LIPASE, BLOOD  URINALYSIS, ROUTINE  W REFLEX MICROSCOPIC   Imaging Review No results found.    MDM   Final diagnoses:  None    Patient is a 68 year old female presents with complaints of sinus drainage into her throat for the past 3 weeks. She has had no relief with over-the-counter medications. Due to the length of her symptoms I suspect this is a sinus infection and we'll treat with antibiotics. She is also having pain in her left side. Her abdomen is benign and laboratory studies are all unremarkable including CBC, lipase, and metabolic panel. She will be treated with Zithromax and discharged to home. She understands to return if her symptoms significantly worsen or change.    Veryl Speak, MD 12/01/13 [phone]

## 2013-12-01 NOTE — Discharge Instructions (Signed)
Zithromax as prescribed.  Return to the emergency department if you develop difficulty breathing, severe abdominal pain, bloody stool, or other new and concerning symptoms.   Sinusitis Sinusitis is redness, soreness, and swelling (inflammation) of the paranasal sinuses. Paranasal sinuses are air pockets within the bones of your face (beneath the eyes, the middle of the forehead, or above the eyes). In healthy paranasal sinuses, mucus is able to drain out, and air is able to circulate through them by way of your nose. However, when your paranasal sinuses are inflamed, mucus and air can become trapped. This can allow bacteria and other germs to grow and cause infection. Sinusitis can develop quickly and last only a short time (acute) or continue over a long period (chronic). Sinusitis that lasts for more than 12 weeks is considered chronic.  CAUSES  Causes of sinusitis include:  Allergies.  Structural abnormalities, such as displacement of the cartilage that separates your nostrils (deviated septum), which can decrease the air flow through your nose and sinuses and affect sinus drainage.  Functional abnormalities, such as when the small hairs (cilia) that line your sinuses and help remove mucus do not work properly or are not present. SYMPTOMS  Symptoms of acute and chronic sinusitis are the same. The primary symptoms are pain and pressure around the affected sinuses. Other symptoms include:  Upper toothache.  Earache.  Headache.  Bad breath.  Decreased sense of smell and taste.  A cough, which worsens when you are lying flat.  Fatigue.  Fever.  Thick drainage from your nose, which often is green and may contain pus (purulent).  Swelling and warmth over the affected sinuses. DIAGNOSIS  Your caregiver will perform a physical exam. During the exam, your caregiver may:  Look in your nose for signs of abnormal growths in your nostrils (nasal polyps).  Tap over the affected sinus  to check for signs of infection.  View the inside of your sinuses (endoscopy) with a special imaging device with a light attached (endoscope), which is inserted into your sinuses. If your caregiver suspects that you have chronic sinusitis, one or more of the following tests may be recommended:  Allergy tests.  Nasal culture A sample of mucus is taken from your nose and sent to a lab and screened for bacteria.  Nasal cytology A sample of mucus is taken from your nose and examined by your caregiver to determine if your sinusitis is related to an allergy. TREATMENT  Most cases of acute sinusitis are related to a viral infection and will resolve on their own within 10 days. Sometimes medicines are prescribed to help relieve symptoms (pain medicine, decongestants, nasal steroid sprays, or saline sprays).  However, for sinusitis related to a bacterial infection, your caregiver will prescribe antibiotic medicines. These are medicines that will help kill the bacteria causing the infection.  Rarely, sinusitis is caused by a fungal infection. In theses cases, your caregiver will prescribe antifungal medicine. For some cases of chronic sinusitis, surgery is needed. Generally, these are cases in which sinusitis recurs more than 3 times per year, despite other treatments. HOME CARE INSTRUCTIONS   Drink plenty of water. Water helps thin the mucus so your sinuses can drain more easily.  Use a humidifier.  Inhale steam 3 to 4 times a day (for example, sit in the bathroom with the shower running).  Apply a warm, moist washcloth to your face 3 to 4 times a day, or as directed by your caregiver.  Use saline nasal sprays  to help moisten and clean your sinuses.  Take over-the-counter or prescription medicines for pain, discomfort, or fever only as directed by your caregiver. SEEK IMMEDIATE MEDICAL CARE IF:  You have increasing pain or severe headaches.  You have nausea, vomiting, or drowsiness.  You  have swelling around your face.  You have vision problems.  You have a stiff neck.  You have difficulty breathing. MAKE SURE YOU:   Understand these instructions.  Will watch your condition.  Will get help right away if you are not doing well or get worse. Document Released: 09/22/2005 Document Revised: 12/15/2011 Document Reviewed: 10/07/2011 [facility] Patient Information 2014 [place], [place].

## 2013-12-01 NOTE — ED Notes (Signed)
MD at bedside. 

## 2013-12-01 NOTE — ED Notes (Addendum)
68 yo female c/o mucous drainage in throat for a few weeks, unable to cough it up. No soreness or fever. Feels like its closing up. Also having pain in the right side and stomach. Saw MD for omeprazole but continues to have flair ups. No painful urination or blood.

## 2013-12-03 ENCOUNTER — Emergency Department (HOSPITAL_COMMUNITY): Payer: Medicare Other

## 2013-12-03 ENCOUNTER — Emergency Department (HOSPITAL_COMMUNITY)
Admission: EM | Admit: 2013-12-03 | Discharge: 2013-12-03 | Disposition: A | Discharge status: Home / Self Care | Payer: Medicare Other | Attending: Emergency Medicine | Admitting: Emergency Medicine

## 2013-12-03 ENCOUNTER — Encounter (HOSPITAL_COMMUNITY): Payer: Self-pay | Admitting: Emergency Medicine

## 2013-12-03 DIAGNOSIS — Z8739 Personal history of other diseases of the musculoskeletal system and connective tissue: Secondary | ICD-10-CM | POA: Insufficient documentation

## 2013-12-03 DIAGNOSIS — R1013 Epigastric pain: Secondary | ICD-10-CM | POA: Insufficient documentation

## 2013-12-03 DIAGNOSIS — I10 Essential (primary) hypertension: Secondary | ICD-10-CM | POA: Insufficient documentation

## 2013-12-03 DIAGNOSIS — E119 Type 2 diabetes mellitus without complications: Secondary | ICD-10-CM | POA: Insufficient documentation

## 2013-12-03 DIAGNOSIS — E669 Obesity, unspecified: Secondary | ICD-10-CM | POA: Insufficient documentation

## 2013-12-03 DIAGNOSIS — Z79899 Other long term (current) drug therapy: Secondary | ICD-10-CM | POA: Insufficient documentation

## 2013-12-03 DIAGNOSIS — Z792 Long term (current) use of antibiotics: Secondary | ICD-10-CM | POA: Insufficient documentation

## 2013-12-03 DIAGNOSIS — R109 Unspecified abdominal pain: Secondary | ICD-10-CM

## 2013-12-03 DIAGNOSIS — R079 Chest pain, unspecified: Secondary | ICD-10-CM | POA: Insufficient documentation

## 2013-12-03 DIAGNOSIS — Z8709 Personal history of other diseases of the respiratory system: Secondary | ICD-10-CM | POA: Insufficient documentation

## 2013-12-03 LAB — CBC WITH DIFFERENTIAL/PLATELET
Basophils Absolute: 0 10*3/uL (ref 0.0–0.1)
Basophils Relative: 0 % (ref 0–1)
Eosinophils Absolute: 0 10*3/uL (ref 0.0–0.7)
Eosinophils Relative: 0 % (ref 0–5)
HCT: 35.4 % — ABNORMAL LOW (ref 36.0–46.0)
Hemoglobin: 12.2 g/dL (ref 12.0–15.0)
Lymphocytes Relative: 21 % (ref 12–46)
Lymphs Abs: 1.5 10*3/uL (ref 0.7–4.0)
MCH: 31.2 pg (ref 26.0–34.0)
MCHC: 34.5 g/dL (ref 30.0–36.0)
MCV: 90.5 fL (ref 78.0–100.0)
Monocytes Absolute: 0.9 10*3/uL (ref 0.1–1.0)
Monocytes Relative: 13 % — ABNORMAL HIGH (ref 3–12)
Neutro Abs: 4.8 10*3/uL (ref 1.7–7.7)
Neutrophils Relative %: 66 % (ref 43–77)
Platelets: 275 10*3/uL (ref 150–400)
RBC: 3.91 MIL/uL (ref 3.87–5.11)
RDW: 12.3 % (ref 11.5–15.5)
WBC: 7.3 10*3/uL (ref 4.0–10.5)

## 2013-12-03 LAB — COMPREHENSIVE METABOLIC PANEL
ALT: 44 U/L — ABNORMAL HIGH (ref 0–35)
AST: 42 U/L — ABNORMAL HIGH (ref 0–37)
Albumin: 3.9 g/dL (ref 3.5–5.2)
Alkaline Phosphatase: 75 U/L (ref 39–117)
BUN: 19 mg/dL (ref 6–23)
CO2: 26 mEq/L (ref 19–32)
Calcium: 9.9 mg/dL (ref 8.4–10.5)
Chloride: 97 mEq/L (ref 96–112)
Creatinine, Ser: 0.91 mg/dL (ref 0.50–1.10)
GFR calc Af Amer: 75 mL/min — ABNORMAL LOW (ref >90)
GFR calc non Af Amer: 65 mL/min — ABNORMAL LOW (ref >90)
Glucose, Bld: 93 mg/dL (ref 70–99)
Potassium: 3.8 mEq/L (ref 3.7–5.3)
Sodium: 139 mEq/L (ref 137–147)
Total Bilirubin: 0.3 mg/dL (ref 0.3–1.2)
Total Protein: 9.2 g/dL — ABNORMAL HIGH (ref 6.0–8.3)

## 2013-12-03 LAB — URINALYSIS, ROUTINE W REFLEX MICROSCOPIC
Bilirubin Urine: NEGATIVE
Glucose, UA: NEGATIVE mg/dL
Hgb urine dipstick: NEGATIVE
Ketones, ur: NEGATIVE mg/dL
Leukocytes, UA: NEGATIVE
Nitrite: NEGATIVE
Protein, ur: NEGATIVE mg/dL
Specific Gravity, Urine: 1.013 (ref 1.005–1.030)
Urobilinogen, UA: 0.2 mg/dL (ref 0.0–1.0)
pH: 7.5 (ref 5.0–8.0)

## 2013-12-03 LAB — I-STAT TROPONIN, ED: Troponin i, poc: 0 ng/mL (ref 0.00–0.08)

## 2013-12-03 LAB — LIPASE, BLOOD: Lipase: 38 U/L (ref 11–59)

## 2013-12-03 MED ORDER — PANTOPRAZOLE SODIUM 20 MG PO TBEC: 20.0000 mg | DELAYED_RELEASE_TABLET | Freq: Two times a day (BID) | ORAL | Status: DC

## 2013-12-03 MED ORDER — SUCRALFATE 1 G PO TABS: 1.0000 g | ORAL_TABLET | Freq: Three times a day (TID) | ORAL | Status: DC

## 2013-12-03 MED ORDER — HYDROCODONE-ACETAMINOPHEN 5-325 MG PO TABS: 1.0000 | ORAL_TABLET | ORAL | Status: DC | PRN

## 2013-12-03 MED ORDER — MORPHINE SULFATE 4 MG/ML IJ SOLN
4.0000 mg | Freq: Once | INTRAMUSCULAR | Status: AC
  Administered 2013-12-03: 4 mg via INTRAVENOUS
  Filled 2013-12-03: qty 1

## 2013-12-03 NOTE — ED Notes (Signed)
Pt presents to department for evaluation of midsternal chest pain, and L sided abdominal pain. Ongoing for several days, pain increased today. 3/10 chest pain, increases with deep breathing. Pt is alert and oriented x4. No signs of acute distress noted.

## 2013-12-03 NOTE — Discharge Instructions (Signed)
Abdominal Pain, Adult °Many things can cause abdominal pain. Usually, abdominal pain is not caused by a disease and will improve without treatment. It can often be observed and treated at home. Your health care provider will do a physical exam and possibly order blood tests and X-rays to help determine the seriousness of your pain. However, in many cases, more time must pass before a clear cause of the pain can be found. Before that point, your health care provider may not know if you need more testing or further treatment. °HOME CARE INSTRUCTIONS  °Monitor your abdominal pain for any changes. The following actions may help to alleviate any discomfort you are experiencing: °· Only take over-the-counter or prescription medicines as directed by your health care provider. °· Do not take laxatives unless directed to do so by your health care provider. °· Try a clear liquid diet (broth, tea, or water) as directed by your health care provider. Slowly move to a bland diet as tolerated. °SEEK MEDICAL CARE IF: °· You have unexplained abdominal pain. °· You have abdominal pain associated with nausea or diarrhea. °· You have pain when you urinate or have a bowel movement. °· You experience abdominal pain that wakes you in the night. °· You have abdominal pain that is worsened or improved by eating food. °· You have abdominal pain that is worsened with eating fatty foods. °SEEK IMMEDIATE MEDICAL CARE IF:  °· Your pain does not go away within 2 hours. °· You have a fever. °· You keep throwing up (vomiting). °· Your pain is felt only in portions of the abdomen, such as the right side or the left lower portion of the abdomen. °· You pass bloody or black tarry stools. °MAKE SURE YOU: °· Understand these instructions.   °· Will watch your condition.   °· Will get help right away if you are not doing well or get worse.   °Document Released: 07/02/2005 Document Revised: 07/13/2013 Document Reviewed: 06/01/2013 °[employer]® Patient  Information ©2014 [employer]. ° °

## 2013-12-03 NOTE — ED Provider Notes (Signed)
CSN: [id number]     Arrival date & time 12/03/13  1400 History   First MD Initiated Contact with Patient 12/03/13 1503     Chief Complaint  Patient presents with  . Chest Pain  . Abdominal Pain   HPI Pt noticed pain in her chest area this morning. It is a burning pain.  It comes and goes.  Laying on her side last night increased the pain.    Taking a deep breath also hurts it although breathing normally does not cause any pain.  The pain sometimes goes to her back.  No vomiting, diarrhea, fever or coughing.  She was seen in the ED recently for a sinus infection.  She also has been having intermittent pain in her left flank for weeks and has had that evaluated but is not sure what is causing it.  No heart disease hx.  No PE. Past Medical History  Diagnosis Date  . Hypertension   . Diabetes mellitus   . Arthritis    Past Surgical History  Procedure Laterality Date  . Abdominal hysterectomy     History reviewed. No pertinent family history. History  Substance Use Topics  . Smoking status: Never Smoker   . Smokeless tobacco: Never Used  . Alcohol Use: No   OB History   Grav Para Term Preterm Abortions TAB SAB Ect Mult Living                 Review of Systems  Constitutional: Negative for fever.  Cardiovascular: Positive for chest pain. Negative for leg swelling.  Gastrointestinal: Negative for vomiting and diarrhea.  Genitourinary: Negative for dysuria.      Allergies  Review of patient's allergies indicates no known allergies.  Home Medications   Current Outpatient Rx  Name  Route  Sig  Dispense  Refill  . azithromycin (ZITHROMAX Z-PAK) 250 MG tablet      2 po day one, then 1 daily x 4 days   6 tablet   0   . carbamazepine (EPITOL) 200 MG tablet   Oral   Take 200 mg by mouth 2 (two) times daily.         . metFORMIN (GLUCOPHAGE) 500 MG tablet   Oral   Take 500 mg by mouth at bedtime.          . Olmesartan-Amlodipine-HCTZ 40-10-25 MG TABS   Oral  Take 1 tablet by mouth daily.         . pantoprazole (PROTONIX) 20 MG tablet   Oral   Take 1 tablet (20 mg total) by mouth 2 (two) times daily.   60 tablet   0   . pregabalin (LYRICA) 100 MG capsule   Oral   Take 100 mg by mouth 3 (three) times daily.         [name] PRESCRIPTION MEDICATION   Topical   Apply 1 application topically daily. Compounded cream from [place] to apply to feet for diabetic pain          BP 169/82  Pulse 100  Temp(Src) 98.1 F (36.7 C) (Oral)  Resp 18  SpO2 98% Physical Exam  Nursing note and vitals reviewed. Constitutional: She appears well-developed and well-nourished. No distress.  Obese   HENT:  Head: Normocephalic and atraumatic.  Right Ear: External ear normal.  Left Ear: External ear normal.  Eyes: Conjunctivae are normal. Right eye exhibits no discharge. Left eye exhibits no discharge. No scleral icterus.  Neck: Neck supple. No tracheal deviation present.  Cardiovascular: Normal rate, regular rhythm and intact distal pulses.   Pulmonary/Chest: Effort normal and breath sounds normal. No stridor. No respiratory distress. She has no wheezes. She has no rales.  Abdominal: Soft. Bowel sounds are normal. She exhibits no distension, no pulsatile midline mass and no mass. There is tenderness (mild epigastrum) in the epigastric area. There is no rebound and no guarding. No hernia.  Musculoskeletal: She exhibits no edema and no tenderness.  Neurological: She is alert. She has normal strength. No cranial nerve deficit (no facial droop, extraocular movements intact, no slurred speech) or sensory deficit. She exhibits normal muscle tone. She displays no seizure activity. Coordination normal.  Skin: Skin is warm and dry. No rash noted.  Psychiatric: She has a normal mood and affect.    ED Course  Procedures (including critical care time) Labs Review Labs Reviewed  CBC WITH DIFFERENTIAL - Abnormal; Notable for the following:    HCT 35.4 (*)     Monocytes Relative 13 (*)    All other components within normal limits  COMPREHENSIVE METABOLIC PANEL - Abnormal; Notable for the following:    Total Protein 9.2 (*)    AST 42 (*)    ALT 44 (*)    GFR calc non Af Amer 65 (*)    GFR calc Af Amer 75 (*)    All other components within normal limits  LIPASE, BLOOD  URINALYSIS, ROUTINE W REFLEX MICROSCOPIC  I-STAT TROPOININ, ED   Imaging Review Ct Abdomen Pelvis Wo Contrast  12/03/2013   CLINICAL DATA:  Left-sided flank and abdominal pain. Hypertension. Diabetes. Hysterectomy.  EXAM: CT ABDOMEN AND PELVIS WITHOUT CONTRAST  TECHNIQUE: Multidetector CT imaging of the abdomen and pelvis was performed following the standard protocol without intravenous contrast.  COMPARISON:  None.  FINDINGS: Lower Chest: Clear lung bases. Normal heart size without pericardial or pleural effusion.  Abdomen/Pelvis: Normal liver, spleen, stomach, pancreas, gallbladder, biliary tract, adrenal glands.  No renal calculi or hydronephrosis. No hydroureter or ureteric calculi. No retroperitoneal or retrocrural adenopathy. Normal colon, appendix, and terminal ileum. Normal small bowel without abdominal ascites. No pelvic adenopathy. Normal urinary bladder and uterus. Normal urinary bladder. Hysterectomy. No adnexal mass. No significant free fluid.  Bones/Musculoskeletal: Degenerative disc disease throughout the lumbosacral spine.  IMPRESSION: 1.  No urinary tract calculi or hydronephrosis. 2. No other explanation for left flank pain.   Electronically Signed   By: Abigail Miyamoto M.D.   On: 12/03/2013 18:09   Dg Chest 2 View  12/03/2013   CLINICAL DATA:  Chest pain  EXAM: CHEST - 2 VIEW  COMPARISON:  11/24/2013  FINDINGS: Lungs are clear. Heart size and mediastinal contours are within normal limits. No effusion. Visualized skeletal structures are unremarkable.  IMPRESSION: No acute cardiopulmonary disease.   Electronically Signed   By: Arne Cleveland M.D.   On: 12/03/2013 15:46      EKG Interpretation   Date/Time:  Saturday December 03 2013 14:35:43 EST Ventricular Rate:  94 PR Interval:  151 QRS Duration: 83 QT Interval:  346 QTC Calculation: 433 R Axis:   -31 Text Interpretation:  Sinus rhythm Left axis deviation No significant  change since last tracing Confirmed by Antwyne Pingree  MD-J, Yong Grieser ([id number]) on  12/03/2013 3:11:06 PM     Medications  morphine 4 MG/ML injection 4 mg (4 mg Intravenous Given 12/03/13 1606)     MDM   Final diagnoses:  Abdominal pain    Doubt cardiac etiology with her several days of pain.  On exam, pt's pain is located epigastric region.  Doubt pancreatitis, cholecystitis.   Will try trial of antacids.  Follow up with PCP.  Warning signs and precautions discussed    Kathalene Frames, MD 12/03/13 [phone]

## 2013-12-03 NOTE — ED Notes (Signed)
Pt dc to home. Pt sts understanding to dc instructions. Pt ambulatory to exit without difficulty.

## 2013-12-12 ENCOUNTER — Other Ambulatory Visit (HOSPITAL_COMMUNITY): Payer: Self-pay | Admitting: Family Medicine

## 2013-12-12 DIAGNOSIS — Z1231 Encounter for screening mammogram for malignant neoplasm of breast: Secondary | ICD-10-CM

## 2014-01-11 ENCOUNTER — Ambulatory Visit (HOSPITAL_COMMUNITY)
Admission: RE | Admit: 2014-01-11 | Discharge: 2014-01-11 | Disposition: A | Discharge status: Home / Self Care | Payer: Medicare Other | Source: Ambulatory Visit | Attending: Family Medicine | Admitting: Family Medicine

## 2014-01-11 DIAGNOSIS — Z1231 Encounter for screening mammogram for malignant neoplasm of breast: Secondary | ICD-10-CM | POA: Insufficient documentation

## 2014-01-31 ENCOUNTER — Other Ambulatory Visit: Payer: Self-pay | Admitting: Orthopedic Surgery

## 2014-01-31 DIAGNOSIS — M545 Low back pain, unspecified: Secondary | ICD-10-CM

## 2014-02-02 ENCOUNTER — Ambulatory Visit
Admission: RE | Admit: 2014-02-02 | Discharge: 2014-02-02 | Disposition: A | Discharge status: Home / Self Care | Payer: Medicare Other | Source: Ambulatory Visit | Attending: Orthopedic Surgery | Admitting: Orthopedic Surgery

## 2014-02-02 DIAGNOSIS — M545 Low back pain, unspecified: Secondary | ICD-10-CM

## 2014-02-02 MED ORDER — METHYLPREDNISOLONE ACETATE 40 MG/ML INJ SUSP (RADIOLOG
120.0000 mg | Freq: Once | INTRAMUSCULAR | Status: AC
  Administered 2014-02-02: 120 mg via EPIDURAL

## 2014-02-02 MED ORDER — IOHEXOL 180 MG/ML  SOLN
1.0000 mL | Freq: Once | INTRAMUSCULAR | Status: AC | PRN
  Administered 2014-02-02: 1 mL via EPIDURAL

## 2014-02-02 NOTE — Discharge Instructions (Signed)

## 2014-02-10 ENCOUNTER — Encounter (HOSPITAL_COMMUNITY): Payer: Self-pay | Admitting: Emergency Medicine

## 2014-02-10 DIAGNOSIS — E669 Obesity, unspecified: Secondary | ICD-10-CM | POA: Diagnosis present

## 2014-02-10 DIAGNOSIS — R1310 Dysphagia, unspecified: Secondary | ICD-10-CM | POA: Diagnosis not present

## 2014-02-10 DIAGNOSIS — I10 Essential (primary) hypertension: Secondary | ICD-10-CM | POA: Diagnosis present

## 2014-02-10 DIAGNOSIS — Z79899 Other long term (current) drug therapy: Secondary | ICD-10-CM | POA: Diagnosis not present

## 2014-02-10 DIAGNOSIS — H814 Vertigo of central origin: Principal | ICD-10-CM | POA: Diagnosis present

## 2014-02-10 DIAGNOSIS — M79609 Pain in unspecified limb: Secondary | ICD-10-CM | POA: Diagnosis not present

## 2014-02-10 DIAGNOSIS — E119 Type 2 diabetes mellitus without complications: Secondary | ICD-10-CM | POA: Diagnosis present

## 2014-02-10 DIAGNOSIS — R262 Difficulty in walking, not elsewhere classified: Secondary | ICD-10-CM | POA: Diagnosis present

## 2014-02-10 DIAGNOSIS — Z6841 Body Mass Index (BMI) 40.0 and over, adult: Secondary | ICD-10-CM | POA: Diagnosis not present

## 2014-02-10 DIAGNOSIS — M129 Arthropathy, unspecified: Secondary | ICD-10-CM | POA: Diagnosis present

## 2014-02-10 LAB — CBG MONITORING, ED: Glucose-Capillary: 153 mg/dL — ABNORMAL HIGH (ref 70–99)

## 2014-02-10 NOTE — ED Notes (Signed)
Pt states she is having some tingling in both of her legs and intermitted weakness and SOB

## 2014-02-11 ENCOUNTER — Observation Stay (HOSPITAL_COMMUNITY)
Admission: EM | Admit: 2014-02-11 | Discharge: 2014-02-12 | DRG: 149 | Disposition: A | Discharge status: Home / Self Care | Payer: Medicare Other | Attending: Internal Medicine | Admitting: Internal Medicine

## 2014-02-11 DIAGNOSIS — M159 Polyosteoarthritis, unspecified: Secondary | ICD-10-CM

## 2014-02-11 DIAGNOSIS — J019 Acute sinusitis, unspecified: Secondary | ICD-10-CM

## 2014-02-11 DIAGNOSIS — E669 Obesity, unspecified: Secondary | ICD-10-CM

## 2014-02-11 DIAGNOSIS — H814 Vertigo of central origin: Secondary | ICD-10-CM

## 2014-02-11 DIAGNOSIS — E119 Type 2 diabetes mellitus without complications: Secondary | ICD-10-CM

## 2014-02-11 DIAGNOSIS — I10 Essential (primary) hypertension: Secondary | ICD-10-CM

## 2014-02-11 DIAGNOSIS — R262 Difficulty in walking, not elsewhere classified: Secondary | ICD-10-CM | POA: Diagnosis present

## 2014-02-11 DIAGNOSIS — R1310 Dysphagia, unspecified: Secondary | ICD-10-CM

## 2014-02-11 LAB — CBC WITH DIFFERENTIAL/PLATELET
Basophils Absolute: 0 10*3/uL (ref 0.0–0.1)
Basophils Relative: 1 % (ref 0–1)
Eosinophils Absolute: 0 10*3/uL (ref 0.0–0.7)
Eosinophils Relative: 1 % (ref 0–5)
HCT: 35.5 % — ABNORMAL LOW (ref 36.0–46.0)
Hemoglobin: 11.9 g/dL — ABNORMAL LOW (ref 12.0–15.0)
Lymphocytes Relative: 31 % (ref 12–46)
Lymphs Abs: 2.1 10*3/uL (ref 0.7–4.0)
MCH: 30.5 pg (ref 26.0–34.0)
MCHC: 33.5 g/dL (ref 30.0–36.0)
MCV: 91 fL (ref 78.0–100.0)
Monocytes Absolute: 0.5 10*3/uL (ref 0.1–1.0)
Monocytes Relative: 7 % (ref 3–12)
Neutro Abs: 4.1 10*3/uL (ref 1.7–7.7)
Neutrophils Relative %: 60 % (ref 43–77)
Platelets: 270 10*3/uL (ref 150–400)
RBC: 3.9 MIL/uL (ref 3.87–5.11)
RDW: 12.5 % (ref 11.5–15.5)
WBC: 6.8 10*3/uL (ref 4.0–10.5)

## 2014-02-11 LAB — GLUCOSE, CAPILLARY
Glucose-Capillary: 127 mg/dL — ABNORMAL HIGH (ref 70–99)
Glucose-Capillary: 112 mg/dL — ABNORMAL HIGH (ref 70–99)
Glucose-Capillary: 117 mg/dL — ABNORMAL HIGH (ref 70–99)
Glucose-Capillary: 124 mg/dL — ABNORMAL HIGH (ref 70–99)

## 2014-02-11 LAB — COMPREHENSIVE METABOLIC PANEL
ALT: 39 U/L — ABNORMAL HIGH (ref 0–35)
AST: 43 U/L — ABNORMAL HIGH (ref 0–37)
Albumin: 4 g/dL (ref 3.5–5.2)
Alkaline Phosphatase: 73 U/L (ref 39–117)
BUN: 23 mg/dL (ref 6–23)
CO2: 24 mEq/L (ref 19–32)
Calcium: 10 mg/dL (ref 8.4–10.5)
Chloride: 98 mEq/L (ref 96–112)
Creatinine, Ser: 0.85 mg/dL (ref 0.50–1.10)
GFR calc Af Amer: 81 mL/min — ABNORMAL LOW (ref >90)
GFR calc non Af Amer: 70 mL/min — ABNORMAL LOW (ref >90)
Glucose, Bld: 133 mg/dL — ABNORMAL HIGH (ref 70–99)
Potassium: 4 mEq/L (ref 3.7–5.3)
Sodium: 138 mEq/L (ref 137–147)
Total Bilirubin: 0.2 mg/dL — ABNORMAL LOW (ref 0.3–1.2)
Total Protein: 9 g/dL — ABNORMAL HIGH (ref 6.0–8.3)

## 2014-02-11 LAB — URINALYSIS, ROUTINE W REFLEX MICROSCOPIC
Bilirubin Urine: NEGATIVE
Glucose, UA: NEGATIVE mg/dL
Hgb urine dipstick: NEGATIVE
Ketones, ur: NEGATIVE mg/dL
Leukocytes, UA: NEGATIVE
Nitrite: NEGATIVE
Protein, ur: NEGATIVE mg/dL
Specific Gravity, Urine: 1.009 (ref 1.005–1.030)
Urobilinogen, UA: 0.2 mg/dL (ref 0.0–1.0)
pH: 7 (ref 5.0–8.0)

## 2014-02-11 LAB — HEMOGLOBIN A1C
Hgb A1c MFr Bld: 7.2 % — ABNORMAL HIGH (ref <5.7)
Mean Plasma Glucose: 160 mg/dL — ABNORMAL HIGH (ref <117)

## 2014-02-11 LAB — LIPID PANEL
Cholesterol: 216 mg/dL — ABNORMAL HIGH (ref 0–200)
HDL: 65 mg/dL (ref >39)
LDL Cholesterol: 133 mg/dL — ABNORMAL HIGH (ref 0–99)
Total CHOL/HDL Ratio: 3.3 RATIO
Triglycerides: 92 mg/dL (ref <150)
VLDL: 18 mg/dL (ref 0–40)

## 2014-02-11 LAB — TROPONIN I: Troponin I: <0.3 ng/mL (ref <0.30)

## 2014-02-11 LAB — APTT: aPTT: 29 seconds (ref 24–37)

## 2014-02-11 LAB — PROTIME-INR
INR: 1.11 (ref 0.00–1.49)
Prothrombin Time: 14.1 seconds (ref 11.6–15.2)

## 2014-02-11 LAB — CARBAMAZEPINE LEVEL, TOTAL: Carbamazepine Lvl: 8.9 ug/mL (ref 4.0–12.0)

## 2014-02-11 MED ORDER — SENNOSIDES-DOCUSATE SODIUM 8.6-50 MG PO TABS
1.0000 | ORAL_TABLET | Freq: Every evening | ORAL | Status: DC | PRN
  Filled 2014-02-11: qty 1

## 2014-02-11 MED ORDER — ASPIRIN 81 MG PO CHEW: 324.0000 mg | CHEWABLE_TABLET | Freq: Once | ORAL | Status: DC

## 2014-02-11 MED ORDER — CARBAMAZEPINE 200 MG PO TABS
200.0000 mg | ORAL_TABLET | Freq: Two times a day (BID) | ORAL | Status: DC
  Administered 2014-02-11 – 2014-02-12 (×2): 200 mg via ORAL
  Filled 2014-02-11 (×3): qty 1

## 2014-02-11 MED ORDER — ENOXAPARIN SODIUM 30 MG/0.3ML ~~LOC~~ SOLN
30.0000 mg | SUBCUTANEOUS | Status: DC
  Administered 2014-02-11 – 2014-02-12 (×2): 30 mg via SUBCUTANEOUS
  Filled 2014-02-11 (×2): qty 0.3

## 2014-02-11 MED ORDER — SIMVASTATIN 20 MG PO TABS
20.0000 mg | ORAL_TABLET | Freq: Every day | ORAL | Status: DC
  Filled 2014-02-11: qty 1

## 2014-02-11 MED ORDER — INSULIN ASPART 100 UNIT/ML ~~LOC~~ SOLN
0.0000 [IU] | SUBCUTANEOUS | Status: DC
  Administered 2014-02-11: 1 [IU] via SUBCUTANEOUS

## 2014-02-11 MED ORDER — SUCRALFATE 1 G PO TABS
1.0000 g | ORAL_TABLET | Freq: Three times a day (TID) | ORAL | Status: DC
  Administered 2014-02-11 – 2014-02-12 (×4): 1 g via ORAL
  Filled 2014-02-11 (×7): qty 1

## 2014-02-11 MED ORDER — BIOTENE DRY MOUTH MT LIQD
15.0000 mL | Freq: Two times a day (BID) | OROMUCOSAL | Status: DC
  Administered 2014-02-11 – 2014-02-12 (×4): 15 mL via OROMUCOSAL

## 2014-02-11 MED ORDER — INSULIN ASPART 100 UNIT/ML ~~LOC~~ SOLN: 0.0000 [IU] | Freq: Three times a day (TID) | SUBCUTANEOUS | Status: DC

## 2014-02-11 MED ORDER — PREGABALIN 50 MG PO CAPS
100.0000 mg | ORAL_CAPSULE | Freq: Three times a day (TID) | ORAL | Status: DC
  Administered 2014-02-11 – 2014-02-12 (×3): 100 mg via ORAL
  Filled 2014-02-11 (×3): qty 2

## 2014-02-11 MED ORDER — INSULIN ASPART 100 UNIT/ML ~~LOC~~ SOLN: 0.0000 [IU] | Freq: Every day | SUBCUTANEOUS | Status: DC

## 2014-02-11 MED ORDER — CHLORHEXIDINE GLUCONATE 0.12 % MT SOLN
15.0000 mL | Freq: Two times a day (BID) | OROMUCOSAL | Status: DC
  Administered 2014-02-11 – 2014-02-12 (×2): 15 mL via OROMUCOSAL
  Filled 2014-02-11 (×5): qty 15

## 2014-02-11 MED ORDER — ASPIRIN 300 MG RE SUPP
300.0000 mg | Freq: Once | RECTAL | Status: AC
  Administered 2014-02-11: 300 mg via RECTAL
  Filled 2014-02-11: qty 1

## 2014-02-11 MED ORDER — ALPRAZOLAM 0.25 MG PO TABS: 0.2500 mg | ORAL_TABLET | Freq: Two times a day (BID) | ORAL | Status: DC | PRN

## 2014-02-11 NOTE — Evaluation (Signed)
SLP Cancellation Note  Patient Details Name: Cathy Gonzalez MRN: [id number] DOB: [birth year]   Cancelled treatment:       Reason Eval/Treat Not Completed:  (order received for SLE to start 5/10.  thank you)   Claudie Fisherman, [facility] SLP [phone]

## 2014-02-11 NOTE — ED Notes (Signed)
Attempted report 

## 2014-02-11 NOTE — Progress Notes (Signed)
SLP Cancellation Note  Patient Details Name: Julina Altmann MRN: [id number] DOB: 23-Oct-1945   Cancelled treatment:  ST received order for SLE per Stroke Protocol.  Per screen cognitive skills baseline with no indication of aphasia.  ST to sign off as education complete with no further formal evaluation indicated at this time.  [name] [place], [place] Marcille Buffy 02/11/2014, 5:12 PM

## 2014-02-11 NOTE — Evaluation (Signed)
Clinical/Bedside Swallow Evaluation Patient Details  Name: Cathy Gonzalez MRN: [id number] Date of Birth: Jan 06, 1946  Today's Date: 02/11/2014 Time: 0160-1093 SLP Time Calculation (min): 15 min  Past Medical History:  Past Medical History  Diagnosis Date  . Hypertension   . Diabetes mellitus   . Arthritis   . Seizures    Past Surgical History:  Past Surgical History  Procedure Laterality Date  . Abdominal hysterectomy     HPI:   Cathy Gonzalez is a 68 y.o. female with a history of HTN  And DM2 who presents to the ED with complaints of increased Dizziness and difficulty walking due to her dizziness x 3 days.   She reports having these symptoms since she had a cortisone shot 3 days ago.   She denies having headache or nausea or vomiting of fever.   She also denies having any chest pain.  She was evaluated in the ED and referred for a posterior CVA workup.    Failed RN Stroke Swallow Screen due to Cathy Gonzalez for dysphagia.  S/s of dysphagia patient described more suspected esophageal based.    Assessment / Plan / Recommendation Clinical Impression  BSE completed per Stroke Protocol.  No outward clinical s/s of aspiration noted.  Adequate airway protection for regular consistency and thin liquids.  ST to sign off as education complete.      Aspiration Risk  Mild    Diet Recommendation Regular;Thin liquid   Liquid Administration via: Cup;Straw Medication Administration: Whole meds with liquid Supervision: Patient able to self feed    Other  Recommendations Oral Care Recommendations: Oral care BID   Follow Up Recommendations  None         Swallow Study Prior Functional Status   Lived at home independent.  No prior Skilled ST     General Date of Onset: 02/11/14 HPI:  Cathy Gonzalez is a 68 y.o. female with a history of HTN  And DM2 who presents to the ED with complaints of increased Dizziness and difficulty walking due to her dizziness x 3 days.   She reports having these symptoms since she had a  cortisone shot 3 days ago.   She denies having headache or nausea or vomiting of fever.   She also denies having any chest pain.  She was evaluated in the ED and referred for a posterior CVA workup.    Type of Study: Bedside swallow evaluation Diet Prior to this Study: Regular;Thin liquids Temperature Spikes Noted: No Respiratory Status: Room air History of Recent Intubation: No Behavior/Cognition: Alert;Cooperative;Pleasant mood Oral Cavity - Dentition: Adequate natural dentition Self-Feeding Abilities: Able to feed self Patient Positioning: Upright in bed Baseline Vocal Quality: Clear Volitional Cough: Strong Volitional Swallow: Able to elicit    Oral/Motor/Sensory Function Overall Oral Motor/Sensory Function: Appears within functional limits for tasks assessed   Ice Chips Ice chips: Within functional limits   Thin Liquid Thin Liquid: Within functional limits Presentation: Cup;Spoon    Nectar Thick Nectar Thick Liquid: Not tested   Honey Thick Honey Thick Liquid: Not tested   Puree Puree: Within functional limits   Solid   GO    Solid: Within functional limits      [name] [name], [name] [name] [name] 02/11/2014,4:19 PM

## 2014-02-11 NOTE — ED Notes (Signed)
Pt states that she is feeling dizzy and off balance for a few days, and her PCP would like her checked for an inner ear infection. Pt states that she has had vertigo before and that this feels the same. Pt states that she has been having throbbing in her feet since she had a hydrocortisone shot last Friday. Pt denies pain at this time.

## 2014-02-11 NOTE — ED Provider Notes (Signed)
CSN: [id number]     Arrival date & time 02/10/14  2001 History   First MD Initiated Contact with Patient 02/11/14 0206     Chief Complaint  Patient presents with  . Leg Pain     (Consider location/radiation/quality/duration/timing/severity/associated sxs/prior Treatment) Patient is a 68 y.o. female presenting with leg pain. The history is provided by the patient.  Leg Pain She was started on a medication for muscle spasms some about one week ago. Following that, she started having some tingling and soreness in her lower legs. This is moderate to severe and she rates it at 7/10. Over the last 3 days, she has noted difficulty with walking. Whenever she stands up, and she stumbles and staggers. She does not fall in any consistent direction. She has not actually fallen but she has come close. She denies headache or tinnitus. She denies hearing loss. She states that this is similar to an episode of vertigo she had several years ago. Symptoms have been generally stable. She denies fever or chills. She denies chest pain. She denies nausea or vomiting or dyspnea.  Past Medical History  Diagnosis Date  . Hypertension   . Diabetes mellitus   . Arthritis   . Seizures    Past Surgical History  Procedure Laterality Date  . Abdominal hysterectomy     No family history on file. History  Substance Use Topics  . Smoking status: Never Smoker   . Smokeless tobacco: Never Used  . Alcohol Use: No   OB History   Grav Para Term Preterm Abortions TAB SAB Ect Mult Living                 Review of Systems  All other systems reviewed and are negative.     Allergies  Review of patient's allergies indicates no known allergies.  Home Medications   Prior to Admission medications   Medication Sig Start Date End Date Taking? Authorizing Provider  ALPRAZolam Duanne Moron) 0.5 MG tablet Take 0.25-0.5 mg by mouth 2 (two) times daily as needed for anxiety.   Yes Historical Provider, MD  amLODipine (NORVASC)  10 MG tablet Take 10 mg by mouth daily.   Yes Historical Provider, MD  carbamazepine (EPITOL) 200 MG tablet Take 200 mg by mouth 2 (two) times daily.   Yes Historical Provider, MD  cetirizine (ZYRTEC) 10 MG tablet Take 10 mg by mouth daily as needed for allergies.   Yes Historical Provider, MD  chlorthalidone (HYGROTON) 25 MG tablet Take 25 mg by mouth daily.   Yes Historical Provider, MD  hydrochlorothiazide (HYDRODIURIL) 25 MG tablet Take 25 mg by mouth daily.   Yes Historical Provider, MD  metFORMIN (GLUCOPHAGE) 500 MG tablet Take 500 mg by mouth at bedtime.    Yes Historical Provider, MD  olmesartan (BENICAR) 40 MG tablet Take 40 mg by mouth daily.   Yes Historical Provider, MD  pregabalin (LYRICA) 100 MG capsule Take 100 mg by mouth 3 (three) times daily.   Yes Historical Provider, MD  sucralfate (CARAFATE) 1 G tablet Take 1 g by mouth 4 (four) times daily -  with meals and at bedtime.   Yes Historical Provider, MD   BP 162/85  Pulse 73  Temp(Src) 98.2 F (36.8 C) (Oral)  Resp 18  Ht 5\' 4"  (1.626 m)  Wt 258 lb 3.2 oz (117.119 kg)  BMI 44.30 kg/m2  SpO2 98% Physical Exam  Nursing note and vitals reviewed.  68 year old female, resting comfortably and in no  acute distress. Vital signs are significant for hypertension with blood pressure 162/85. Oxygen saturation is 98%, which is normal. Head is normocephalic and atraumatic. PERRLA, EOMI. Oropharynx is clear. There is no nystagmus. Neck is nontender and supple without adenopathy or JVD. There no carotid bruits. Back is nontender and there is no CVA tenderness. Lungs are clear without rales, wheezes, or rhonchi. Chest is nontender. Heart has regular rate and rhythm without murmur. Abdomen is soft, flat, nontender without masses or hepatosplenomegaly and peristalsis is normoactive. Extremities have trace edema. There is mild bilateral calf tenderness without any cords or erythema or warmth. Full range of motion is present. Skin is warm  and dry without rash. Neurologic: Mental status is normal, cranial nerves are intact, there are no motor or sensory deficits. Finger to nose testing is normal. Romberg is negative. She is unable to stand in the heel-to-toe position with either foot forward. There is no consistent direction that she is falling.  ED Course  Procedures (including critical care time) Labs Review Results for orders placed during the hospital encounter of 02/11/14  CBC WITH DIFFERENTIAL      Result Value Ref Range   WBC 6.8  4.0 - 10.5 K/uL   RBC 3.90  3.87 - 5.11 MIL/uL   Hemoglobin 11.9 (*) 12.0 - 15.0 g/dL   HCT 35.5 (*) 36.0 - 46.0 %   MCV 91.0  78.0 - 100.0 fL   MCH 30.5  26.0 - 34.0 pg   MCHC 33.5  30.0 - 36.0 g/dL   RDW 12.5  11.5 - 15.5 %   Platelets 270  150 - 400 K/uL   Neutrophils Relative % 60  43 - 77 %   Neutro Abs 4.1  1.7 - 7.7 K/uL   Lymphocytes Relative 31  12 - 46 %   Lymphs Abs 2.1  0.7 - 4.0 K/uL   Monocytes Relative 7  3 - 12 %   Monocytes Absolute 0.5  0.1 - 1.0 K/uL   Eosinophils Relative 1  0 - 5 %   Eosinophils Absolute 0.0  0.0 - 0.7 K/uL   Basophils Relative 1  0 - 1 %   Basophils Absolute 0.0  0.0 - 0.1 K/uL  COMPREHENSIVE METABOLIC PANEL      Result Value Ref Range   Sodium 138  137 - 147 mEq/L   Potassium 4.0  3.7 - 5.3 mEq/L   Chloride 98  96 - 112 mEq/L   CO2 24  19 - 32 mEq/L   Glucose, Bld 133 (*) 70 - 99 mg/dL   BUN 23  6 - 23 mg/dL   Creatinine, Ser 0.85  0.50 - 1.10 mg/dL   Calcium 10.0  8.4 - 10.5 mg/dL   Total Protein 9.0 (*) 6.0 - 8.3 g/dL   Albumin 4.0  3.5 - 5.2 g/dL   AST 43 (*) 0 - 37 U/L   ALT 39 (*) 0 - 35 U/L   Alkaline Phosphatase 73  39 - 117 U/L   Total Bilirubin 0.2 (*) 0.3 - 1.2 mg/dL   GFR calc non Af Amer 70 (*) >90 mL/min   GFR calc Af Amer 81 (*) >90 mL/min  PROTIME-INR      Result Value Ref Range   Prothrombin Time 14.1  11.6 - 15.2 seconds   INR 1.11  0.00 - 1.49  APTT      Result Value Ref Range   aPTT 29  24 - 37 seconds   TROPONIN I  Result Value Ref Range   Troponin I <0.30  <0.30 ng/mL  CBG MONITORING, ED      Result Value Ref Range   Glucose-Capillary 153 (*) 70 - 99 mg/dL    EKG Interpretation   Date/Time:  Saturday Feb 11 2014 03:10:59 EDT Ventricular Rate:  73 PR Interval:  156 QRS Duration: 82 QT Interval:  396 QTC Calculation: 436 R Axis:   -30 Text Interpretation:  Normal sinus rhythm Left axis deviation Low voltage  QRS Cannot rule out Anterior infarct , age undetermined Abnormal ECG When  compared with ECG of 12/03/2013, No significant change was found Confirmed  by [facility]  MD, Ermalinda Joubert ([id number]) on 02/11/2014 3:14:11 AM      MDM   Final diagnoses:  Vertigo, central    Dizziness which seems to be central vertigo. I am concerned that she had a posterior circulation stroke several days ago. She is well outside the window of any acute intervention for a stroke and outside the window for code stroke. She will be admitted for neurologic observation and MRI scanning in the morning. Since she is not a candidate for an  acute intervention, CT is not obtained since it has very low sensitivity for posterior fossa lesions. Old records were reviewed and she does have a prior ED visit for her dizziness but that was very different. That was related to palpitations.  Laboratory workup shows minimal elevation of transaminases and borderline low hemoglobin. Case is discussed with Dr. Arnoldo Morale [facility] who agrees to admit the patient under observation status to rule out stroke as cause of a central vertigo.  Delora Fuel, MD 59/93/57 0177

## 2014-02-11 NOTE — ED Notes (Signed)
Jenkins, MD at bedside.  

## 2014-02-11 NOTE — H&P (Signed)
[facility] History and Physical  Cathy Gonzalez ZES:[id number] DOB: May 14, 1946 DOA: 02/11/2014  Referring physician:  PCP: [name], MD  Specialists:   Chief Complaint:   Dizziness   HPI: Cathy Gonzalez is a 68 y.o. female with a history of HTN  And DM2 who presents to the ED with complaints of increased Dizziness and difficulty walking due to her dizziness x 3 days.   She reports having these symptoms since she had a cortisone shot 3 days ago.   She denies having headache or nausea or vomiting of fever.   She also denies having any chest pain.  She was evaluated in the ED and referred for a posterior CVA workup.     Review of Systems:  Constitutional: No Weight Loss, No Weight Gain, Night Sweats, Fevers, Chills, Fatigue,  +Dizziness,  No Generalized Weakness HEENT: No Headaches, Difficulty Swallowing,Tooth/Dental Problems,Sore Throat,  No Sneezing, Rhinitis, Ear Ache, Nasal Congestion, or Post Nasal Drip,  Cardio-vascular:  No Chest pain, Orthopnea, PND, Edema in lower extremities, Anasarca, Dizziness, Palpitations  Resp: No Dyspnea, No DOE, No Productive Cough, No Non-Productive Cough, No Hemoptysis, No Change in Color of Mucus,  No Wheezing.    GI: No Heartburn, Indigestion, Abdominal Pain, Nausea, Vomiting, Diarrhea, Change in Bowel Habits,  Loss of Appetite  GU: No Dysuria, Change in Color of Urine, No Urgency or Frequency.  No flank pain.  Musculoskeletal: No Joint Pain or Swelling.  No Decreased Range of Motion. No Back Pain.  Neurologic: No Syncope, No Seizures, Muscle Weakness, Paresthesias in Feet and hands Intermittently, Vision Disturbance or Loss, No Diplopia, +Vertigo, +Difficulty Walking,  Skin: No Rash or Lesions. Psych: No Change in Mood or Affect. No Depression or Anxiety. No Memory loss. No Confusion or Hallucinations   Past Medical History  Diagnosis Date  . Hypertension   . Diabetes mellitus   . Arthritis   . Seizures       Past Surgical History  Procedure  Laterality Date  . Abdominal hysterectomy         Prior to Admission medications   Medication Sig Start Date End Date Taking? Authorizing Provider  ALPRAZolam Duanne Moron) 0.5 MG tablet Take 0.25-0.5 mg by mouth 2 (two) times daily as needed for anxiety.   Yes Historical Provider, MD  amLODipine (NORVASC) 10 MG tablet Take 10 mg by mouth daily.   Yes Historical Provider, MD  carbamazepine (EPITOL) 200 MG tablet Take 200 mg by mouth 2 (two) times daily.   Yes Historical Provider, MD  cetirizine (ZYRTEC) 10 MG tablet Take 10 mg by mouth daily as needed for allergies.   Yes Historical Provider, MD  chlorthalidone (HYGROTON) 25 MG tablet Take 25 mg by mouth daily.   Yes Historical Provider, MD  hydrochlorothiazide (HYDRODIURIL) 25 MG tablet Take 25 mg by mouth daily.   Yes Historical Provider, MD  metFORMIN (GLUCOPHAGE) 500 MG tablet Take 500 mg by mouth at bedtime.    Yes Historical Provider, MD  olmesartan (BENICAR) 40 MG tablet Take 40 mg by mouth daily.   Yes Historical Provider, MD  pregabalin (LYRICA) 100 MG capsule Take 100 mg by mouth 3 (three) times daily.   Yes Historical Provider, MD  sucralfate (CARAFATE) 1 G tablet Take 1 g by mouth 4 (four) times daily -  with meals and at bedtime.   Yes Historical Provider, MD      No Known Allergies   Social History:  reports that she has never smoked. She has never used smokeless  tobacco. She reports that she does not drink alcohol or use illicit drugs.     No family history on file.     Physical Exam:  GEN:  Pleasant Obese 68 y.o. African American female  examined  and in no acute distress; cooperative with exam Filed Vitals:   02/11/14 0453 02/11/14 0515 02/11/14 0530 02/11/14 0545  BP: 163/101 134/72 130/75 125/62  Pulse: 75 57 62 57  Temp:      TempSrc:      Resp:  14 16 15   Height:      Weight:      SpO2:  100% 99% 97%   Blood pressure 125/62, pulse 57, temperature 98.2 F (36.8 C), temperature source Oral, resp. rate 15,  height 5\' 4"  (1.626 m), weight 117.119 kg (258 lb 3.2 oz), SpO2 97.00%. PSYCH: She is alert and oriented x4; does not appear anxious does not appear depressed; affect is normal HEENT: Normocephalic and Atraumatic, Mucous membranes pink; PERRLA; EOM intact; Fundi:  Benign;  No scleral icterus, Nares: Patent, Oropharynx: Clear, Edentulous,  Neck:  FROM, no cervical lymphadenopathy nor thyromegaly or carotid bruit; no JVD; Breasts:: Not examined CHEST WALL: No tenderness CHEST: Normal respiration, clear to auscultation bilaterally HEART: Regular rate and rhythm; no murmurs rubs or gallops BACK: No kyphosis or scoliosis; no CVA tenderness ABDOMEN: Positive Bowel Sounds, Obese, soft non-tender; no masses, no organomegaly, no pannus; no intertriginous candida. Rectal Exam: Not done EXTREMITIES: No cyanosis, clubbing or edema; no ulcerations. Genitalia: not examined PULSES: 2+ and symmetric SKIN: Normal hydration no rash or ulceration CNS:   Mental Status:  Alert, oriented, thought content appropriate. Speech fluent without evidence of aphasia. Able to follow 3 step commands without difficulty. In No obvious pain.  Cranial Nerves:  II: Discs flat bilaterally; Visual fields Intact,  Pupils equal and reactive.  III,IV, VI: Extra-ocular motions intact bilaterally  V,VII: smile symmetric, facial light touch sensation normal bilaterally  VIII: hearing Intact Bilaterally  IX,X: gag reflex present  XI: bilateral shoulder shrug  XII: midline tongue extension  Motor:  Right : Upper extremity 5/5 Left: Upper extremity 5/5  Right:  Lower extremity 5/5 Left:  Lower extremity 5/5  Tone and bulk:normal tone throughout; no atrophy noted  Sensory: Pinprick and light touch intact throughout, bilaterally  Deep Tendon Reflexes: 2+ and symmetric throughout  Plantars/ Babinski: Right: normal Left:  normal   Cerebellar: Finger to nose without difficulty.  Gait: deferred  Vascular: pulses palpable throughout     Labs on Admission:  Basic Metabolic Panel:  Recent Labs Lab 02/11/14 0217  NA 138  K 4.0  CL 98  CO2 24  GLUCOSE 133*  BUN 23  CREATININE 0.85  CALCIUM 10.0   Liver Function Tests:  Recent Labs Lab 02/11/14 0217  AST 43*  ALT 39*  ALKPHOS 73  BILITOT 0.2*  PROT 9.0*  ALBUMIN 4.0   No results found for this basename: LIPASE, AMYLASE,  in the last 168 hours No results found for this basename: AMMONIA,  in the last 168 hours CBC:  Recent Labs Lab 02/11/14 0217  WBC 6.8  NEUTROABS 4.1  HGB 11.9*  HCT 35.5*  MCV 91.0  PLT 270   Cardiac Enzymes:  Recent Labs Lab 02/11/14 0218  TROPONINI <0.30    BNP (last 3 results) No results found for this basename: PROBNP,  in the last 8760 hours CBG:  Recent Labs Lab 02/10/14 2030  GLUCAP 153*    Radiological Exams on Admission: No  results found.   EKG: Independently reviewed. Normal Sinus Rhythm rate 73,  No acute S-T changes.      Assessment/Plan:   68 y.o. female with  Principal Problem:   Vertigo, central Active Problems:   Difficulty walking   Dysphagia   Essential hypertension, benign   Diabetes mellitus     1.   Central Vertigo-   Causing Difficulty walking, Admitted to evaluate for Posterior Circulation CVA, CVA protocol ordered, and MRI/MRA of Brain this AM.  Neuro Check, and ASA Rx.     2.   Dysphagia- has hx of problems prior to this event, Failed initial Swallow screen due to history,  Speech evaluation of swallowing.    3.    HTN-  IV hydralazine while NPO.     4.    DM2- Hold metformin while NPO, SSI coverage with q 4 hr Glucose checks.  And Check HbA1c.    5.    DVT prophylaxis with Lovenox.        Code Status:    FULL CODE  Family Communication:  No Family present    Disposition Plan:     Observation Status  Time spent:  48 Minutes  [facility] Pager [phone]  If 7PM-7AM, please contact night-coverage [email or web address] Password  [email or web address] 02/11/2014, 6:14 AM

## 2014-02-11 NOTE — Progress Notes (Signed)
Patient ID: Cathy Gonzalez  female  LOV:[id number]    DOB: [birth year]    DOA: 02/11/2014  PCP: [name], MD  Assessment/Plan: Principal Problem:   Vertigo, central with dizziness: Rule out procedure circulation CVA - Currently n.p.o., waiting for swallow evaluation - Continue aspirin, MRI/MRA, 2-D echo, carotid Dopplers - LDL 133, cholesterol 216, placed on Zocor to start once taking oral - PTOT evaluation  Active Problems:   Essential hypertension, benign - Currently stable    Difficulty walking - PTOT evaluation    Diabetes mellitus - Placed on sliding scale insulin     Dysphagia: - Awaiting swallow evaluation    DVT Prophylaxis: Lovenox  Code Status:  Family Communication:  Disposition:  Consultants:  None  Procedures:  None  Antibiotics:  None    Subjective: Patient alert, awake and oriented, no dysarthria, asking about food, no obvious neurological deficits  Objective: Weight change:  No intake or output data in the 24 hours ending 02/11/14 1044 Blood pressure 148/70, pulse 59, temperature 97.5 F (36.4 C), temperature source Oral, resp. rate 18, height 5\' 4"  (1.626 m), weight 115.803 kg (255 lb 4.8 oz), SpO2 97.00%.  Physical Exam: General: Alert and awake, oriented x3, not in any acute distress. CVS: S1-S2 clear, no murmur rubs or gallops Chest: clear to auscultation bilaterally, no wheezing, rales or rhonchi Abdomen: soft nontender, nondistended, normal bowel sounds  Extremities: no cyanosis, clubbing or edema noted bilaterally Neuro: Cranial nerves II-XII intact, no focal neurological deficits, gait not assessed  Lab Results: Basic Metabolic Panel:  Recent Labs Lab 02/11/14 0217  NA 138  K 4.0  CL 98  CO2 24  GLUCOSE 133*  BUN 23  CREATININE 0.85  CALCIUM 10.0   Liver Function Tests:  Recent Labs Lab 02/11/14 0217  AST 43*  ALT 39*  ALKPHOS 73  BILITOT 0.2*  PROT 9.0*  ALBUMIN 4.0   No results found for this  basename: LIPASE, AMYLASE,  in the last 168 hours No results found for this basename: AMMONIA,  in the last 168 hours CBC:  Recent Labs Lab 02/11/14 0217  WBC 6.8  NEUTROABS 4.1  HGB 11.9*  HCT 35.5*  MCV 91.0  PLT 270   Cardiac Enzymes:  Recent Labs Lab 02/11/14 0218  TROPONINI <0.30   BNP: No components found with this basename: POCBNP,  CBG:  Recent Labs Lab 02/10/14 2030 02/11/14 0742  GLUCAP 153* 127*     Micro Results: No results found for this or any previous visit (from the past 240 hour(s)).  Studies/Results: Dg Epidurography  02/02/2014   CLINICAL DATA:  Lumbosacral spondylosis without myelopathy. Low back and right lower extremity pain. Significant relief after the previous injection, without side effect or complication. Partial recurrence of symptoms. The patient wishes to repeat.  The procedure, risks, benefits, and alternatives were explained to the patient. Questions regarding the procedure were encouraged and answered. The patient understands and consents to the procedure.  PROCEDURE: LUMBAR EPIDURAL INJECTION: An interlaminar approach was performed on the right at L5-S1. Operator donned sterile gloves and mask. The overlying skin was cleansed and anesthetized. A LONG 20 gauge Crawford epidural needle was advanced using loss-of-resistance technique.  DIAGNOSTIC EPIDURAL INJECTION: Injection of Omnipaque 180 shows a good epidural pattern with spread above and below the level of needle placement. No intrathecal or vascular opacification is seen.  THERAPEUTIC EPIDURAL INJECTION: 120mg  of Depo-Medrol mixed with 23ml lidocaine 1% were instilled. The procedure was well-tolerated, and the patient was discharged  thirty minutes following the injection in good condition.  FLUOROSCOPY TIME:  20 seconds  COMPLICATIONS: None  IMPRESSION: Technically successful epidural injection on the right at L5-S1.   Electronically Signed   By: Arne Cleveland M.D.   On: 02/02/2014 09:34     Medications: Scheduled Meds: . antiseptic oral rinse  15 mL Mouth Rinse q12n4p  . aspirin  324 mg Oral Once  . chlorhexidine  15 mL Mouth Rinse BID  . enoxaparin (LOVENOX) injection  30 mg Subcutaneous Q24H  . insulin aspart  0-9 Units Subcutaneous 6 times per day  . simvastatin  20 mg Oral q1800      LOS: 0 days   Mashonda Broski Krystal Eaton M.D. [facility] 02/11/2014, 10:44 AM Pager: [phone]  If 7PM-7AM, please contact night-coverage [email or web address] Password [email or web address]  **Disclaimer: This note was dictated with voice recognition software. Similar sounding words can inadvertently be transcribed and this note may contain transcription errors which may not have been corrected upon publication of note.**

## 2014-02-12 ENCOUNTER — Inpatient Hospital Stay (HOSPITAL_COMMUNITY): Payer: Medicare Other

## 2014-02-12 DIAGNOSIS — I6789 Other cerebrovascular disease: Secondary | ICD-10-CM

## 2014-02-12 LAB — COMPREHENSIVE METABOLIC PANEL
ALT: 33 U/L (ref 0–35)
AST: 34 U/L (ref 0–37)
Albumin: 3.4 g/dL — ABNORMAL LOW (ref 3.5–5.2)
Alkaline Phosphatase: 59 U/L (ref 39–117)
BUN: 23 mg/dL (ref 6–23)
CO2: 26 mEq/L (ref 19–32)
Calcium: 9.8 mg/dL (ref 8.4–10.5)
Chloride: 99 mEq/L (ref 96–112)
Creatinine, Ser: 0.97 mg/dL (ref 0.50–1.10)
GFR calc Af Amer: 69 mL/min — ABNORMAL LOW (ref >90)
GFR calc non Af Amer: 60 mL/min — ABNORMAL LOW (ref >90)
Glucose, Bld: 111 mg/dL — ABNORMAL HIGH (ref 70–99)
Potassium: 3.5 mEq/L — ABNORMAL LOW (ref 3.7–5.3)
Sodium: 140 mEq/L (ref 137–147)
Total Bilirubin: 0.3 mg/dL (ref 0.3–1.2)
Total Protein: 8 g/dL (ref 6.0–8.3)

## 2014-02-12 LAB — GLUCOSE, CAPILLARY
Glucose-Capillary: 119 mg/dL — ABNORMAL HIGH (ref 70–99)
Glucose-Capillary: 105 mg/dL — ABNORMAL HIGH (ref 70–99)
Glucose-Capillary: 107 mg/dL — ABNORMAL HIGH (ref 70–99)

## 2014-02-12 MED ORDER — MECLIZINE HCL 25 MG PO TABS: 25.0000 mg | ORAL_TABLET | Freq: Three times a day (TID) | ORAL | Status: DC | PRN

## 2014-02-12 MED ORDER — SIMVASTATIN 5 MG PO TABS: 5.0000 mg | ORAL_TABLET | Freq: Every day | ORAL | Status: DC

## 2014-02-12 MED ORDER — ASPIRIN EC 81 MG PO TBEC: 81.0000 mg | DELAYED_RELEASE_TABLET | Freq: Every day | ORAL | Status: DC

## 2014-02-12 MED ORDER — SIMVASTATIN 5 MG PO TABS
5.0000 mg | ORAL_TABLET | Freq: Every day | ORAL | Status: DC
  Filled 2014-02-12: qty 1

## 2014-02-12 MED ORDER — MECLIZINE HCL 25 MG PO TABS
25.0000 mg | ORAL_TABLET | Freq: Three times a day (TID) | ORAL | Status: DC | PRN
  Filled 2014-02-12: qty 1

## 2014-02-12 MED ORDER — HYDROCHLOROTHIAZIDE 25 MG PO TABS
25.0000 mg | ORAL_TABLET | Freq: Every day | ORAL | Status: DC
  Administered 2014-02-12: 25 mg via ORAL
  Filled 2014-02-12: qty 1

## 2014-02-12 MED ORDER — PREGABALIN 100 MG PO CAPS: 100.0000 mg | ORAL_CAPSULE | Freq: Three times a day (TID) | ORAL | Status: DC

## 2014-02-12 MED ORDER — IRBESARTAN 300 MG PO TABS
300.0000 mg | ORAL_TABLET | Freq: Every day | ORAL | Status: DC
  Administered 2014-02-12: 300 mg via ORAL
  Filled 2014-02-12: qty 1

## 2014-02-12 MED ORDER — POTASSIUM CHLORIDE CRYS ER 20 MEQ PO TBCR
40.0000 meq | EXTENDED_RELEASE_TABLET | Freq: Once | ORAL | Status: AC
  Administered 2014-02-12: 40 meq via ORAL
  Filled 2014-02-12: qty 2

## 2014-02-12 MED ORDER — AMLODIPINE BESYLATE 10 MG PO TABS
10.0000 mg | ORAL_TABLET | Freq: Every day | ORAL | Status: DC
  Administered 2014-02-12: 10 mg via ORAL
  Filled 2014-02-12: qty 1

## 2014-02-12 NOTE — Discharge Summary (Signed)
Physician Discharge Summary  Patient ID: Cathy Gonzalez MRN: [id number] DOB/AGE: 02/16/1946 68 y.o.  Admit date: 02/11/2014 Discharge date: 02/12/2014  Primary Care Physician:  [name], MD  Discharge Diagnoses:    . Vertigo, central . Essential hypertension, benign . Diabetes mellitus . Dysphagia  Consults:  None   Allergies:  No Known Allergies   Discharge Medications:   Medication List         ALPRAZolam 0.5 MG tablet  Commonly known as:  XANAX  Take 0.25-0.5 mg by mouth 2 (two) times daily as needed for anxiety.     amLODipine 10 MG tablet  Commonly known as:  NORVASC  Take 10 mg by mouth daily.     aspirin EC 81 MG tablet  Take 1 tablet (81 mg total) by mouth daily.     cetirizine 10 MG tablet  Commonly known as:  ZYRTEC  Take 10 mg by mouth daily as needed for allergies.     chlorthalidone 25 MG tablet  Commonly known as:  HYGROTON  Take 25 mg by mouth daily.     EPITOL 200 MG tablet  Generic drug:  carbamazepine  Take 200 mg by mouth 2 (two) times daily.     hydrochlorothiazide 25 MG tablet  Commonly known as:  HYDRODIURIL  Take 25 mg by mouth daily.     meclizine 25 MG tablet  Commonly known as:  ANTIVERT  Take 1 tablet (25 mg total) by mouth 3 (three) times daily as needed for dizziness.     metFORMIN 500 MG tablet  Commonly known as:  GLUCOPHAGE  Take 500 mg by mouth at bedtime.     olmesartan 40 MG tablet  Commonly known as:  BENICAR  Take 40 mg by mouth daily.     pregabalin 100 MG capsule  Commonly known as:  LYRICA  Take 1 capsule (100 mg total) by mouth 3 (three) times daily.     simvastatin 5 MG tablet  Commonly known as:  ZOCOR  Take 1 tablet (5 mg total) by mouth at bedtime.     sucralfate 1 G tablet  Commonly known as:  CARAFATE  Take 1 g by mouth 4 (four) times daily -  with meals and at bedtime.         Brief H and P: For complete details please refer to admission H and P, but in brief Cathy Gonzalez is a 68 y.o.  female with a history of HTN And DM2 who presents to the ED with complaints of increased Dizziness and difficulty walking due to her dizziness x 3 days. She reports having these symptoms since she had a cortisone shot 3 days ago. She denies having headache or nausea or vomiting of fever. She also denies having any chest pain. She was evaluated in the ED and referred for a posterior CVA workup.    Hospital Course:  Vertigo, central with dizziness: Patient was admitted for ruling out posterior circulation CVA/TIA, symptoms have completely resolved. MRI of the brain showed age advanced periventricular and subcortical white matter disease, nonspecific but no acute intracranial abnormality. MRA showed normal variant circle of Willis. 2-D echo showed EF of 65-70% normal wall motion LDL 133, cholesterol 216, placed on Zocor   PTOT evaluation was done which showed patient was at her baseline now. She is ambulating without any difficulty.   Essential hypertension, benign - Currently stable, restarted amlodipine, ARB   Difficulty walking : Resolved, no dizziness, PT evaluation, no PT followup needed  Diabetes mellitus continue metformin:   Dysphagia: Tolerating carb modify diet without any difficulty    Day of Discharge BP 150/87  Pulse 71  Temp(Src) 97.7 F (36.5 C) (Oral)  Resp 20  Ht 5\' 4"  (1.626 m)  Wt 114.216 kg (251 lb 12.8 oz)  BMI 43.20 kg/m2  SpO2 97%  Physical Exam: General: Alert and awake oriented x3 not in any acute distress. CVS: S1-S2 clear no murmur rubs or gallops Chest: clear to auscultation bilaterally, no wheezing rales or rhonchi Abdomen: soft nontender, nondistended, normal bowel sounds Extremities: no cyanosis, clubbing or edema noted bilaterally Neuro: Cranial nerves II-XII intact, no focal neurological deficits   The results of significant diagnostics from this hospitalization (including imaging, microbiology, ancillary and laboratory) are listed below for  reference.    LAB RESULTS: Basic Metabolic Panel:  Recent Labs Lab 02/11/14 0217 02/12/14 0426  NA 138 140  K 4.0 3.5*  CL 98 99  CO2 24 26  GLUCOSE 133* 111*  BUN 23 23  CREATININE 0.85 0.97  CALCIUM 10.0 9.8   Liver Function Tests:  Recent Labs Lab 02/11/14 0217 02/12/14 0426  AST 43* 34  ALT 39* 33  ALKPHOS 73 59  BILITOT 0.2* 0.3  PROT 9.0* 8.0  ALBUMIN 4.0 3.4*   No results found for this basename: LIPASE, AMYLASE,  in the last 168 hours No results found for this basename: AMMONIA,  in the last 168 hours CBC:  Recent Labs Lab 02/11/14 0217  WBC 6.8  NEUTROABS 4.1  HGB 11.9*  HCT 35.5*  MCV 91.0  PLT 270   Cardiac Enzymes:  Recent Labs Lab 02/11/14 0218  TROPONINI <0.30   BNP: No components found with this basename: POCBNP,  CBG:  Recent Labs Lab 02/12/14 1205 02/12/14 1709  GLUCAP 105* 107*    Significant Diagnostic Studies:  Mr Brain Wo Contrast  02/12/2014   CLINICAL DATA:  Increased dizziness and difficulty walking. Symptoms began after a steroid injection 3 days ago.  EXAM: MRI HEAD WITHOUT CONTRAST  MRA HEAD WITHOUT CONTRAST  TECHNIQUE: Multiplanar, multiecho pulse sequences of the brain and surrounding structures were obtained without intravenous contrast. Angiographic images of the head were obtained using MRA technique without contrast.  COMPARISON:  CT head without contrast 07/13/2012.  FINDINGS: MRI HEAD FINDINGS  The diffusion-weighted images demonstrate no evidence for acute or subacute infarction. Mild periventricular and scattered subcortical white matter changes are somewhat advanced for age. No hemorrhage or mass lesion is present. The ventricles are of normal size. No significant extra-axial fluid collection is present. Incidental note is made of a persistent cavum septum pellucidum et vergae. The midline structures are otherwise within normal limits.  Flow is present in the major intracranial arteries. The globes and orbits are  intact. The paranasal sinuses are clear. Minimal fluid is present in the left mastoid air cells. No obstructing nasopharyngeal lesion is evident.  MRA HEAD FINDINGS  The internal carotid arteries are within normal limits from the high cervical segments through the ICA termini bilaterally. The A1 and M1 segments are within normal limits. The anterior communicating artery is patent. The MCA bifurcations are within normal limits. MCA and ACA branch vessels are unremarkable.  The left vertebral artery is slightly dominant to the right. The left PICA origin is visualized and normal. The right PICA is less well seen. AICA vessels are visible. The study is mildly degraded by motion through this area. The basilar artery is within normal limits. Both posterior cerebral arteries  originate from the basilar tip. A prominent posterior left communicating artery is present as well. The PCA branch vessels are within normal limits.  IMPRESSION: 1. Age advanced periventricular and subcortical white matter disease. This is nonspecific, but likely reflects the sequela of chronic microvascular ischemia. 2. No acute intracranial abnormality. 3. Normal variant circle of Willis without evidence for significant proximal stenosis, aneurysm, or branch vessel occlusion.   Electronically Signed   By: Lawrence Santiago M.D.   On: 02/12/2014 12:54   Mr Jodene Nam Head/brain Wo Cm  02/12/2014   CLINICAL DATA:  Increased dizziness and difficulty walking. Symptoms began after a steroid injection 3 days ago.  EXAM: MRI HEAD WITHOUT CONTRAST  MRA HEAD WITHOUT CONTRAST  TECHNIQUE: Multiplanar, multiecho pulse sequences of the brain and surrounding structures were obtained without intravenous contrast. Angiographic images of the head were obtained using MRA technique without contrast.  COMPARISON:  CT head without contrast 07/13/2012.  FINDINGS: MRI HEAD FINDINGS  The diffusion-weighted images demonstrate no evidence for acute or subacute infarction. Mild  periventricular and scattered subcortical white matter changes are somewhat advanced for age. No hemorrhage or mass lesion is present. The ventricles are of normal size. No significant extra-axial fluid collection is present. Incidental note is made of a persistent cavum septum pellucidum et vergae. The midline structures are otherwise within normal limits.  Flow is present in the major intracranial arteries. The globes and orbits are intact. The paranasal sinuses are clear. Minimal fluid is present in the left mastoid air cells. No obstructing nasopharyngeal lesion is evident.  MRA HEAD FINDINGS  The internal carotid arteries are within normal limits from the high cervical segments through the ICA termini bilaterally. The A1 and M1 segments are within normal limits. The anterior communicating artery is patent. The MCA bifurcations are within normal limits. MCA and ACA branch vessels are unremarkable.  The left vertebral artery is slightly dominant to the right. The left PICA origin is visualized and normal. The right PICA is less well seen. AICA vessels are visible. The study is mildly degraded by motion through this area. The basilar artery is within normal limits. Both posterior cerebral arteries originate from the basilar tip. A prominent posterior left communicating artery is present as well. The PCA branch vessels are within normal limits.  IMPRESSION: 1. Age advanced periventricular and subcortical white matter disease. This is nonspecific, but likely reflects the sequela of chronic microvascular ischemia. 2. No acute intracranial abnormality. 3. Normal variant circle of Willis without evidence for significant proximal stenosis, aneurysm, or branch vessel occlusion.   Electronically Signed   By: Lawrence Santiago M.D.   On: 02/12/2014 12:54    2D ECHO: Study Conclusions  Left ventricle: The cavity size was normal. Wall thickness was normal. Systolic function was vigorous. The estimated ejection fraction  was in the range of 65% to 70%. Wall motion was normal; there were no regional wall motion abnormalities.    Disposition and Follow-up: Discharge Orders   Future Orders Complete By Expires   Diet Carb Modified  As directed    Increase activity slowly  As directed        DISPOSITION: Home  DIET: Carb modify diet    DISCHARGE FOLLOW-UP Follow-up Information   Follow up with [name], MD. Schedule an appointment as soon as possible for a visit in 2 weeks. (for hospital follow-up)    Specialty:  Family Medicine   Contact information:   [address] [place] [address]  [phone]       Time spent on Discharge: 45 mins  Signed:   Mendel Corning M.D. [facility] 02/12/2014, 5:49 PM Pager: [phone]   **Disclaimer: This note was dictated with voice recognition software. Similar sounding words can inadvertently be transcribed and this note may contain transcription errors which may not have been corrected upon publication of note.**

## 2014-02-12 NOTE — Progress Notes (Signed)
  Echocardiogram 2D Echocardiogram has been performed.  [place] 02/12/2014, 10:55 AM

## 2014-02-12 NOTE — Progress Notes (Signed)
Patient ID: Cathy Gonzalez  female  OXB:[id number]    DOB: [birth year]    DOA: 02/11/2014  PCP: [name], MD  Assessment/Plan: Principal Problem:   Vertigo, central with dizziness: Rule out posterior circulation CVA, symptoms have improved, vertigo versus TIA - tolerating diet now - Continue aspirin, -  MRI/MRA, 2-D echo, carotid Dopplers still pending - LDL 133, cholesterol 216, placed on Zocor  - PTOT evaluation  Active Problems:   Essential hypertension, benign - Currently stable, restarted amlodipine, ARB    Difficulty walking - PTOT evaluation pending    Diabetes mellitus - Placed on sliding scale insulin     Dysphagia: Tolerating carb modify diet     DVT Prophylaxis: Lovenox  Code Status:  Family Communication: Discussed in detail with the patient and her daughter at the bedside  Disposition:  Consultants:  None  Procedures:  None  Antibiotics:  None    Subjective: Patient alert, awake and oriented, no dysarthria, no dysphagia, tolerating diet  Objective: Weight change: -2.903 kg (-6 lb 6.4 oz)  Intake/Output Summary (Last 24 hours) at 02/12/14 0948 Last data filed at 02/11/14 1659  Gross per 24 hour  Intake    360 ml  Output    600 ml  Net   -240 ml   Blood pressure 146/76, pulse 71, temperature 97.7 F (36.5 C), temperature source Oral, resp. rate 20, height 5\' 4"  (1.626 m), weight 114.216 kg (251 lb 12.8 oz), SpO2 97.00%.  Physical Exam: General: Ax O x3 CVS: S1-S2 clear, no murmur rubs or gallops Chest: clear to auscultation bilaterally, no wheezing, rales or rhonchi Abdomen: soft NT, NBS, ND Extremities: no c/c/e bilaterally Neuro: Cranial nerves II-XII intact, no focal neurological deficits, gait not assessed  Lab Results: Basic Metabolic Panel:  Recent Labs Lab 02/11/14 0217 02/12/14 0426  NA 138 140  K 4.0 3.5*  CL 98 99  CO2 24 26  GLUCOSE 133* 111*  BUN 23 23  CREATININE 0.85 0.97  CALCIUM 10.0 9.8   Liver  Function Tests:  Recent Labs Lab 02/11/14 0217 02/12/14 0426  AST 43* 34  ALT 39* 33  ALKPHOS 73 59  BILITOT 0.2* 0.3  PROT 9.0* 8.0  ALBUMIN 4.0 3.4*   No results found for this basename: LIPASE, AMYLASE,  in the last 168 hours No results found for this basename: AMMONIA,  in the last 168 hours CBC:  Recent Labs Lab 02/11/14 0217  WBC 6.8  NEUTROABS 4.1  HGB 11.9*  HCT 35.5*  MCV 91.0  PLT 270   Cardiac Enzymes:  Recent Labs Lab 02/11/14 0218  TROPONINI <0.30   BNP: No components found with this basename: POCBNP,  CBG:  Recent Labs Lab 02/11/14 0742 02/11/14 1148 02/11/14 1658 02/11/14 2040 02/12/14 0811  GLUCAP 127* 112* 117* 124* 119*     Micro Results: No results found for this or any previous visit (from the past 240 hour(s)).  Studies/Results: Dg Epidurography  02/02/2014   CLINICAL DATA:  Lumbosacral spondylosis without myelopathy. Low back and right lower extremity pain. Significant relief after the previous injection, without side effect or complication. Partial recurrence of symptoms. The patient wishes to repeat.  The procedure, risks, benefits, and alternatives were explained to the patient. Questions regarding the procedure were encouraged and answered. The patient understands and consents to the procedure.  PROCEDURE: LUMBAR EPIDURAL INJECTION: An interlaminar approach was performed on the right at L5-S1. Operator donned sterile gloves and mask. The overlying skin was cleansed and anesthetized.  A LONG 20 gauge Crawford epidural needle was advanced using loss-of-resistance technique.  DIAGNOSTIC EPIDURAL INJECTION: Injection of Omnipaque 180 shows a good epidural pattern with spread above and below the level of needle placement. No intrathecal or vascular opacification is seen.  THERAPEUTIC EPIDURAL INJECTION: 120mg  of Depo-Medrol mixed with 42ml lidocaine 1% were instilled. The procedure was well-tolerated, and the patient was discharged thirty  minutes following the injection in good condition.  FLUOROSCOPY TIME:  20 seconds  COMPLICATIONS: None  IMPRESSION: Technically successful epidural injection on the right at L5-S1.   Electronically Signed   By: Arne Cleveland M.D.   On: 02/02/2014 09:34    Medications: Scheduled Meds: . amLODipine  10 mg Oral Daily  . antiseptic oral rinse  15 mL Mouth Rinse q12n4p  . aspirin  324 mg Oral Once  . carbamazepine  200 mg Oral BID  . chlorhexidine  15 mL Mouth Rinse BID  . enoxaparin (LOVENOX) injection  30 mg Subcutaneous Q24H  . insulin aspart  0-5 Units Subcutaneous QHS  . insulin aspart  0-9 Units Subcutaneous TID WC  . irbesartan  300 mg Oral Daily  . potassium chloride  40 mEq Oral Once  . pregabalin  100 mg Oral TID  . sucralfate  1 g Oral TID WC & HS      LOS: 1 day   Jailene Cupit Krystal Eaton M.D. [facility] 02/12/2014, 9:48 AM Pager: [phone]  If 7PM-7AM, please contact night-coverage [email or web address] Password [email or web address]  **Disclaimer: This note was dictated with voice recognition software. Similar sounding words can inadvertently be transcribed and this note may contain transcription errors which may not have been corrected upon publication of note.**

## 2014-02-12 NOTE — Progress Notes (Signed)
*  PRELIMINARY RESULTS* Vascular Ultrasound Carotid Duplex (Doppler) has been completed.   Findings suggest 1-39% internal carotid artery stenosis bilaterally. Vertebral arteries are patent with antegrade flow.  02/12/2014 4:24 PM Maudry Mayhew, RVT, RDCS, RDMS

## 2014-02-12 NOTE — Evaluation (Signed)
Physical Therapy Evaluation and Discharge Patient Details Name: Cathy Gonzalez MRN: [id number] DOB: Jun 11, 1946 Today's Date: 02/12/2014   History of Present Illness  Pt is a 68 y/o female with a history of HTN And DM2 who presents to the ED with complaints of increased dizziness and difficulty walking due to her dizziness x3 days. She reports having these symptoms since she had a cortisone shot (chart review does not reveal location of shot).  Clinical Impression  Patient evaluated by Physical Therapy with no further acute PT needs identified. All education has been completed and the patient has no further questions. At the time of PT eval pt requires no assist, is modified independent with all mobility, and does not complain of any dizziness. See below for any follow-up Physial Therapy or equipment needs. PT is signing off. Please reconsult if needs change.      Follow Up Recommendations No PT follow up    Equipment Recommendations  None recommended by PT    Recommendations for Other Services       Precautions / Restrictions Precautions Precautions: Fall Restrictions Weight Bearing Restrictions: No      Mobility  Bed Mobility Overal bed mobility: Modified Independent             General bed mobility comments: Pt able to transition to EOB without physical assist. Minimal use of bed rails for support.   Transfers Overall transfer level: Needs assistance Equipment used: None Transfers: Sit to/from Stand Sit to Stand: Modified independent (Device/Increase time)         General transfer comment: Increased time required for pt to power-up to full standing. No unsteadiness noted, and physical assist was not required.   Ambulation/Gait Ambulation/Gait assistance: Modified independent (Device/Increase time) Ambulation Distance (Feet): 200 Feet Assistive device: None Gait Pattern/deviations: WFL(Within Functional Limits);Decreased stride length Gait velocity: Decreased Gait  velocity interpretation: Below normal speed for age/gender General Gait Details: Pt was able to ambulate without AD or need for support from therapist. Demonstrated ability to perform turns and sudden start/stops with no LOB.   Stairs            Wheelchair Mobility    Modified Rankin (Stroke Patients Only)       Balance Overall balance assessment: No apparent balance deficits (not formally assessed)                                           Pertinent Vitals/Pain Pt has no complaints throughout session.     Home Living Family/patient expects to be discharged to:: Private residence Living Arrangements: Non-relatives/Friends Available Help at Discharge: Friend(s);Family;Available 24 hours/day Type of Home: House Home Access: Stairs to enter   [employer] of Steps: 2 Home Layout: One level Home Equipment: None      Prior Function Level of Independence: Independent         Comments: Driving, working     Hand Dominance   Dominant Hand: Right    Extremity/Trunk Assessment   Upper Extremity Assessment: Overall WFL for tasks assessed           Lower Extremity Assessment: LLE deficits/detail   LLE Deficits / Details: Knee flexors on L side only 4-/5 strength compared to R side which was 4+/5. All other strength equal in LE's.  Cervical / Trunk Assessment: Normal  Communication   Communication: No difficulties  Cognition Arousal/Alertness: Awake/alert Behavior During Therapy:  WFL for tasks assessed/performed Overall Cognitive Status: Within Functional Limits for tasks assessed                      General Comments General comments (skin integrity, edema, etc.): Pt did not complain of dizziness at all throughout session.     Exercises        Assessment/Plan    PT Assessment Patent does not need any further PT services  PT Diagnosis     PT Problem List    PT Treatment Interventions     PT Goals (Current  goals can be found in the Care Plan section) Acute Rehab PT Goals PT Goal Formulation: No goals set, d/c therapy    Frequency     Barriers to discharge        Co-evaluation               End of Session Equipment Utilized During Treatment: Gait belt Activity Tolerance: Patient tolerated treatment well Patient left: in chair;with call bell/phone within reach;with family/visitor present Nurse Communication: Mobility status         Time: 0911-0926 PT Time Calculation (min): 15 min   Charges:   PT Evaluation $Initial PT Evaluation Tier I: 1 Procedure     PT G CodesJolyn Lent 02/12/2014, 12:22 PM  Jolyn Lent, PT, DPT Acute Rehabilitation Services Pager: [phone]

## 2014-02-14 NOTE — Discharge Instructions (Signed)
STROKE/TIA DISCHARGE INSTRUCTIONS SMOKING Cigarette smoking nearly doubles your risk of having a stroke & is the single most alterable risk factor  If you smoke or have smoked in the last 12 months, you are advised to quit smoking for your health.  Most of the excess cardiovascular risk related to smoking disappears within a year of stopping.  Ask you doctor about anti-smoking medications  [place] Quit Line: 1-800-QUIT NOW  Free Smoking Cessation Classes [phone]  CHOLESTEROL Know your levels; limit fat & cholesterol in your diet  Lipid Panel     Component Value Date/Time   CHOL 216* 02/11/2014 0650   TRIG 92 02/11/2014 0650   HDL 65 02/11/2014 0650   CHOLHDL 3.3 02/11/2014 0650   VLDL 18 02/11/2014 0650   LDLCALC 133* 02/11/2014 0650      Many patients benefit from treatment even if their cholesterol is at goal.  Goal: Total Cholesterol (CHOL) less than 160  Goal:  Triglycerides (TRIG) less than 150  Goal:  HDL greater than 40  Goal:  LDL (LDLCALC) less than 100   BLOOD PRESSURE American Stroke Association blood pressure target is less that 120/80 mm/Hg  Your discharge blood pressure is:  BP: 150/87 mmHg  Monitor your blood pressure  Limit your salt and alcohol intake  Many individuals will require more than one medication for high blood pressure  DIABETES (A1c is a blood sugar average for last 3 months) Goal HGBA1c is under 7% (HBGA1c is blood sugar average for last 3 months)  Diabetes: Diagnosis of diabetes:  Your A1c:7.2 %    Lab Results  Component Value Date   HGBA1C 7.2* 02/11/2014     Your HGBA1c can be lowered with medications, healthy diet, and exercise.  Check your blood sugar as directed by your physician  Call your physician if you experience unexplained or low blood sugars.  PHYSICAL ACTIVITY/REHABILITATION Goal is 30 minutes at least 4 days per week  Activity: Increase activity slowly, Therapies: {STROKE DC THERAPIES:22361} Return to work:   Activity  decreases your risk of heart attack and stroke and makes your heart stronger.  It helps control your weight and blood pressure; helps you relax and can improve your mood.  Participate in a regular exercise program.  Talk with your doctor about the best form of exercise for you (dancing, walking, swimming, cycling).  DIET/WEIGHT Goal is to maintain a healthy weight  Your discharge diet is:   carb modified liquids Your height is:  Height: 5\' 4"  (162.6 cm) Your current weight is: Weight: 114.216 kg (251 lb 12.8 oz) Your Body Mass Index (BMI) is:  BMI (Calculated): 43.9  Following the type of diet specifically designed for you will help prevent another stroke.  Your goal weight range is:    Your goal Body Mass Index (BMI) is 19-24.  Healthy food habits can help reduce 3 risk factors for stroke:  High cholesterol, hypertension, and excess weight.  RESOURCES Stroke/Support Group:  Call [phone]   STROKE EDUCATION PROVIDED/REVIEWED AND GIVEN TO PATIENT Stroke warning signs and symptoms How to activate emergency medical system (call 911). Medications prescribed at discharge. Need for follow-up after discharge. Personal risk factors for stroke. DM, HTN Pneumonia vaccine given: No Flu vaccine given: No My questions have been answered, the writing is legible, and I understand these instructions.  I will adhere to these goals & educational materials that have been provided to me after my discharge from the hospital.    Diabetes Self-Management Education  02/14/2014 Cathy Gonzalez, identified by name and date of birth, is a 68 y.o. female with  .  Other people present during visit:      ASSESSMENT  Patient Concerns:     Blood pressure 150/87, pulse 71, temperature 97.7 F (36.5 C), temperature source Oral, resp. rate 20, height 5\' 4"  (1.626 m), weight 114.216 kg (251 lb 12.8 oz), SpO2 97.00%. Body mass index is 43.2 kg/(m^2).  Lab Results: LDL Cholesterol  Date Value Ref Range  Status  02/11/2014 133* 0 - 99 mg/dL Final                Total Cholesterol/HDL:CHD Risk     Coronary Heart Disease Risk Table                         Men   Women      1/2 Average Risk   3.4   3.3      Average Risk       5.0   4.4      2 X Average Risk   9.6   7.1      3 X Average Risk  23.4   11.0                Use the calculated Patient Ratio     above and the CHD Risk Table     to determine the patient's CHD Risk.                ATP III CLASSIFICATION (LDL):      <100     mg/dL   Optimal      100-129  mg/dL   Near or Above                        Optimal      130-159  mg/dL   Borderline      160-189  mg/dL   High      >190     mg/dL   Very High     Hemoglobin A1C  Date Value Ref Range Status  02/11/2014 7.2* <5.7 % Final     (NOTE)                                                                               According to the ADA Clinical Practice Recommendations for 2011, when     HbA1c is used as a screening test:      >=6.5%   Diagnostic of Diabetes Mellitus               (if abnormal result is confirmed)     5.7-6.4%   Increased risk of developing Diabetes Mellitus     References:Diagnosis and Classification of Diabetes Mellitus,Diabetes     LOVF,6433,29(JJOAC 1):S62-S69 and Standards of Medical Care in             Diabetes - 2011,Diabetes Care,2011,34 (Suppl 1):S11-S61.     No family history on file. History  Substance Use Topics   Smoking status: Never Smoker    Smokeless tobacco: Never Used   Alcohol Use: No    Support Systems:  Special Needs:     Prior DM Education:   Daily Foot Exams:   Patient Belief / Attitude about Diabetes:     Assessment comments:     Diet Recall:     Individualized Plan for Diabetes Self-Management Training:    Education Topics Reviewed with Patient Today:  Topic Points Discussed  Disease State    Nutrition Management    Physical Activity and Exercise    Medications    Monitoring    Acute Complications      Chronic Complications    Psychosocial Adjustment    Goal Setting    Preconception Care (if applicable)      PATIENTS GOALS   Learning Objective(s):     Goal The patient agrees to:  Nutrition    Physical Activity    Medications    Monitoring    Problem Solving    Reducing Risk    Health Coping     Patient Self-Evaluation of Goals (Subsequent Visits)  Goal The patient rates self as meeting goals (% of time)  Nutrition    Physical Activity    Medications    Monitoring    Problem Solving    Reducing Risk    Health Coping       PERSONALIZED PLAN / SUPPORT  Self-Management Support:     Goal Evaluation:  Never 0% ______________________________________________________________________  Outcomes  Expected Outcomes:     Self-Care Barriers:     Education material provided:   If problems or questions, patient to contact team via:  Phone  Time in: {Time; Appointment:21385}     Time out: {Time; Appointment:21385}  Future DSME appointment: -     Noralee Space [facility] 02/14/2014 2:48 AM

## 2014-02-16 NOTE — Care Management (Signed)
Retro. UR Completed Jacqlyn Krauss, RN,BSN [phone]

## 2014-03-06 NOTE — Progress Notes (Addendum)
Physical Therapy Evaluation Addendum for G-Codes   02/15/2014 0914  PT G-Codes **NOT FOR INPATIENT CLASS**  Functional Assessment Tool Used Clinical judgement  Functional Limitation Mobility: Walking and moving around  Mobility: Walking and Moving Around Current Status ([id number]) CI  Mobility: Walking and Moving Around Goal Status [phone]) CI  Mobility: Walking and Moving Around Discharge Status [phone]) CI    Jolyn Lent, PT, DPT Acute Rehabilitation Services Pager: [phone]

## 2014-03-13 ENCOUNTER — Emergency Department (HOSPITAL_COMMUNITY)
Admission: EM | Admit: 2014-03-13 | Discharge: 2014-03-13 | Disposition: A | Discharge status: Home / Self Care | Payer: Medicare Other | Source: Home / Self Care

## 2014-03-13 ENCOUNTER — Encounter (HOSPITAL_COMMUNITY): Payer: Self-pay | Admitting: Emergency Medicine

## 2014-03-13 DIAGNOSIS — E1349 Other specified diabetes mellitus with other diabetic neurological complication: Secondary | ICD-10-CM | POA: Diagnosis not present

## 2014-03-13 DIAGNOSIS — E1142 Type 2 diabetes mellitus with diabetic polyneuropathy: Secondary | ICD-10-CM

## 2014-03-13 DIAGNOSIS — M791 Myalgia, unspecified site: Secondary | ICD-10-CM

## 2014-03-13 DIAGNOSIS — R5381 Other malaise: Secondary | ICD-10-CM

## 2014-03-13 DIAGNOSIS — E0840 Diabetes mellitus due to underlying condition with diabetic neuropathy, unspecified: Secondary | ICD-10-CM

## 2014-03-13 DIAGNOSIS — E119 Type 2 diabetes mellitus without complications: Secondary | ICD-10-CM

## 2014-03-13 DIAGNOSIS — IMO0001 Reserved for inherently not codable concepts without codable children: Secondary | ICD-10-CM | POA: Diagnosis not present

## 2014-03-13 MED ORDER — TRAMADOL HCL ER 100 MG PO TB24
100.0000 mg | ORAL_TABLET | Freq: Every day | ORAL | Status: DC
Start: 2014-03-13 — End: 2015-03-14

## 2014-03-13 MED ORDER — DICLOFENAC POTASSIUM 50 MG PO TABS: 50.0000 mg | ORAL_TABLET | Freq: Three times a day (TID) | ORAL | Status: DC

## 2014-03-13 NOTE — ED Notes (Signed)
Patient reports legs hurt from buttocks to feet.  Not new, but worse than usual.  Patient stands at her job and feels this is making pain worse-no known injury.  Reports chronic issues.

## 2014-03-13 NOTE — ED Provider Notes (Signed)
CSN: [id number]     Arrival date & time 03/13/14  0855 History   First MD Initiated Contact with Patient 03/13/14 [phone]     Chief Complaint  Patient presents with  . 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 When she went back to work a little overweight ago her back started hurting her again. She states her orthopedist told her that she had degenerative disc disease. 4 days ago when she went back to work she developed pain in the posterior buttocks, thighs and calves. The pain is worse primarily  upon standing and when she is working. Denies focal paresthesias or weakness. She has been out of work for 6 months with sedentary lifestyle and deconditioning. She reports seeing her orthopedist for her back problem approximately one week ago and was given Norco. She states this did not help her pain.   Past Medical History  Diagnosis Date  . Hypertension   . Diabetes mellitus   . Arthritis   . Seizures    Past Surgical History  Procedure Laterality Date  . Abdominal hysterectomy     No family history on file. History  Substance Use Topics  . Smoking status: Never Smoker   . Smokeless tobacco: Never Used  . Alcohol Use: No   OB History   Grav Para Term Preterm Abortions TAB SAB Ect Mult Living                 Review of Systems  Constitutional: Positive for activity change. Negative for fever and chills.  HENT: Negative.   Respiratory: Negative.   Cardiovascular: Negative.   Musculoskeletal: Positive for myalgias. Negative for neck pain and neck stiffness.       As per HPI  Skin: Negative for pallor and rash.  Neurological: Negative.     Allergies  Review of patient's allergies indicates no known allergies.  Home Medications   Prior to Admission  medications   Medication Sig Start Date End Date Taking? Authorizing Provider  ALPRAZolam Duanne Moron) 0.5 MG tablet Take 0.25-0.5 mg by mouth 2 (two) times daily as needed for anxiety.    Historical Provider, MD  amLODipine (NORVASC) 10 MG tablet Take 10 mg by mouth daily.    Historical Provider, MD  aspirin EC 81 MG tablet Take 1 tablet (81 mg total) by mouth daily. 02/12/14   Ripudeep Krystal Eaton, MD  carbamazepine (EPITOL) 200 MG tablet Take 200 mg by mouth 2 (two) times daily.    Historical Provider, MD  cetirizine (ZYRTEC) 10 MG tablet Take 10 mg by mouth daily as needed for allergies.    Historical Provider, MD  chlorthalidone (HYGROTON) 25 MG tablet Take 25 mg by mouth daily.    Historical Provider, MD  diclofenac (CATAFLAM) 50 MG tablet Take 1 tablet (50 mg total) by mouth 3 (three) times daily. Prn pain 03/13/14   Janne Napoleon, NP  hydrochlorothiazide (HYDRODIURIL) 25 MG tablet Take 25 mg by mouth daily.    Historical Provider, MD  meclizine (ANTIVERT) 25 MG tablet Take 1 tablet (25 mg total) by mouth 3 (three) times daily as needed for dizziness. 02/12/14   Ripudeep Krystal Eaton, MD  metFORMIN (GLUCOPHAGE) 500 MG tablet Take 500 mg by mouth at bedtime.     Historical Provider, MD  olmesartan (BENICAR) 40 MG tablet Take 40 mg by mouth daily.  Historical Provider, MD  pregabalin (LYRICA) 100 MG capsule Take 1 capsule (100 mg total) by mouth 3 (three) times daily. 02/12/14   Ripudeep Krystal Eaton, MD  simvastatin (ZOCOR) 5 MG tablet Take 1 tablet (5 mg total) by mouth at bedtime. 02/12/14   Ripudeep Krystal Eaton, MD  sucralfate (CARAFATE) 1 G tablet Take 1 g by mouth 4 (four) times daily -  with meals and at bedtime.    Historical Provider, MD  traMADol (ULTRAM-ER) 100 MG 24 hr tablet Take 1 tablet (100 mg total) by mouth daily. 03/13/14   Janne Napoleon, NP   BP 161/95  Pulse 80  Temp(Src) 97.9 F (36.6 C) (Oral)  Resp 18  SpO2 97% Physical Exam  Nursing note and vitals reviewed. Constitutional: She is oriented to person,  place, and time. She appears well-developed and well-nourished. No distress.  Eyes: EOM are normal.  Neck: Normal range of motion. Neck supple.  Cardiovascular: Normal rate.   Pulmonary/Chest: Effort normal. No respiratory distress.  Musculoskeletal: She exhibits tenderness. She exhibits no edema.  Palpation of the bilateral posterior buttocks, thighs and calves. These tenderness. No appreciable swelling. No joint pain. Tenderness to the lower lumbar para spinal musculature.  Neurological: She is alert and oriented to person, place, and time.  Skin: Skin is warm and dry.    ED Course  Procedures (including critical care time) Labs Review Labs Reviewed - No data to display  Imaging Review No results found.   MDM   1. Myalgia   2. Physical deconditioning   3. Diabetes mellitus due to underlying condition with diabetic neuropathy   4. T2DM (type 2 diabetes mellitus)      Short term cataflam Tramadol 100 er Heat, stretches Recommend discuss PT with PCP in 2 days.  Janne Napoleon, NP 03/13/14 1005

## 2014-03-13 NOTE — Discharge Instructions (Signed)
Musculoskeletal Pain Ask your doctor about physical therapy Musculoskeletal pain is muscle and boney aches and pains. These pains can occur in any part of the body. Your caregiver may treat you without knowing the cause of the pain. They may treat you if blood or urine tests, X-rays, and other tests were normal.  CAUSES There is often not a definite cause or reason for these pains. These pains may be caused by a type of germ (virus). The discomfort may also come from overuse. Overuse includes working out too hard when your body is not fit. Boney aches also come from weather changes. Bone is sensitive to atmospheric pressure changes. HOME CARE INSTRUCTIONS   Ask when your test results will be ready. Make sure you get your test results.  Only take over-the-counter or prescription medicines for pain, discomfort, or fever as directed by your caregiver. If you were given medications for your condition, do not drive, operate machinery or power tools, or sign legal documents for 24 hours. Do not drink alcohol. Do not take sleeping pills or other medications that may interfere with treatment.  Continue all activities unless the activities cause more pain. When the pain lessens, slowly resume normal activities. Gradually increase the intensity and duration of the activities or exercise.  During periods of severe pain, bed rest may be helpful. Lay or sit in any position that is comfortable.  Putting ice on the injured area.  Put ice in a bag.  Place a towel between your skin and the bag.  Leave the ice on for 15 to 20 minutes, 3 to 4 times a day.  Follow up with your caregiver for continued problems and no reason can be found for the pain. If the pain becomes worse or does not go away, it may be necessary to repeat tests or do additional testing. Your caregiver may need to look further for a possible cause. SEEK IMMEDIATE MEDICAL CARE IF:  You have pain that is getting worse and is not relieved by  medications.  You develop chest pain that is associated with shortness or breath, sweating, feeling sick to your stomach (nauseous), or throw up (vomit).  Your pain becomes localized to the abdomen.  You develop any new symptoms that seem different or that concern you. MAKE SURE YOU:   Understand these instructions.  Will watch your condition.  Will get help right away if you are not doing well or get worse. Document Released: 09/22/2005 Document Revised: 12/15/2011 Document Reviewed: 05/27/2013 [facility] Patient Information 2014 [place].  Myalgia, Adult Myalgia is the medical term for muscle pain. It is a symptom of many things. Nearly everyone at some time in their life has this. The most common cause for muscle pain is overuse or straining and more so when you are not in shape. Injuries and muscle bruises cause myalgias. Muscle pain without a history of injury can also be caused by a virus. It frequently comes along with the flu. Myalgia not caused by muscle strain can be present in a large number of infectious diseases. Some autoimmune diseases like lupus and fibromyalgia can cause muscle pain. Myalgia may be mild, or severe. SYMPTOMS  The symptoms of myalgia are simply muscle pain. Most of the time this is short lived and the pain goes away without treatment. DIAGNOSIS  Myalgia is diagnosed by your caregiver by taking your history. This means you tell him when the problems began, what they are, and what has been happening. If this has not  been a long term problem, your caregiver may want to watch for a while to see what will happen. If it has been long term, they may want to do additional testing. TREATMENT  The treatment depends on what the underlying cause of the muscle pain is. Often anti-inflammatory medications will help. HOME CARE INSTRUCTIONS  If the pain in your muscles came from overuse, slow down your activities until the problems go away.  Myalgia from overuse of a  muscle can be treated with alternating hot and cold packs on the muscle affected or with cold for the first couple days. If either heat or cold seems to make things worse, stop their use.  Apply ice to the sore area for 15-20 minutes, 03-04 times per day, while awake for the first 2 days of muscle soreness, or as directed. Put the ice in a plastic bag and place a towel between the bag of ice and your skin.  Only take over-the-counter or prescription medicines for pain, discomfort, or fever as directed by your caregiver.  Regular gentle exercise may help if you are not active.  Stretching before strenuous exercise can help lower the risk of myalgia. It is normal when beginning an exercise regimen to feel some muscle pain after exercising. Muscles that have not been used frequently will be sore at first. If the pain is extreme, this may mean injury to a muscle. SEEK MEDICAL CARE IF:  You have an increase in muscle pain that is not relieved with medication.  You begin to run a temperature.  You develop nausea and vomiting.  You develop a stiff and painful neck.  You develop a rash.  You develop muscle pain after a tick bite.  You have continued muscle pain while working out even after you are in good condition. SEEK IMMEDIATE MEDICAL CARE IF: Any of your problems are getting worse and medications are not helping. MAKE SURE YOU:   Understand these instructions.  Will watch your condition.  Will get help right away if you are not doing well or get worse. Document Released: 08/14/2006 Document Revised: 12/15/2011 Document Reviewed: 11/03/2006 [facility] Patient Information 2014 [place], [place].  Diabetic Neuropathy Diabetic neuropathy is a nerve disease or nerve damage that is caused by diabetes mellitus. About half of all people with diabetes mellitus have some form of nerve damage. Nerve damage is more common in those who have had diabetes mellitus for many years and who generally have  not had good control of their blood sugar (glucose) level. Diabetic neuropathy is a common complication of diabetes mellitus. There are three more common types of diabetic neuropathy and a fourth type that is less common and less understood:   Peripheral neuropathy This is the most common type of diabetic neuropathy. It causes damage to the nerves of the feet and legs first and then eventually the hands and arms.The damage affects the ability to sense touch.  Autonomic neuropathy This type causes damage to the autonomic nervous system, which controls the following functions:  Heartbeat.  Body temperature.  Blood pressure.  Urination.  Digestion.  Sweating.  Sexual function.  Focal neuropathy Focal neuropathy can be painful and unpredictable and occurs most often in older adults with diabetes mellitus. It involves a specific nerve or one area and often comes on suddenly. It usually does not cause long-term problems.  Radiculoplexus neuropathy Sometimes called lumbosacral radiculoplexus neuropathy, radiculoplexus neuropathy affects the nerves of the thighs, hips, buttocks, or legs. It is more common in people  with type 2 diabetes mellitus and in older men. It is characterized by debilitating pain, weakness, and atrophy, usually in the thigh muscles. CAUSES  The cause of peripheral, autonomic, and focal neuropathies is diabetes mellitus that is uncontrolled and high glucose levels. The cause of radiculoplexus neuropathy is unknown. However, it is thought to be caused by inflammation related to uncontrolled glucose levels. SIGNS AND SYMPTOMS  Peripheral Neuropathy Peripheral neuropathy develops slowly over time. When the nerves of the feet and legs no longer work there may be:   Burning, stabbing, or aching pain in the legs or feet.  Inability to feel pressure or pain in your feet. This can lead to:  Thick calluses over pressure areas.  Pressure sores.  Ulcers.  Foot  deformities.  Reduced ability to feel temperature changes.  Muscle weakness. Autonomic Neuropathy The symptoms of autonomic neuropathy vary depending on which nerves are affected. Symptoms may include:  Problems with digestion, such as:  Feeling sick to your stomach (nausea).  Vomiting.  Bloating.  Constipation.  Diarrhea.  Abdominal pain.  Difficulty with urination. This occurs if you lose your ability to sense when your bladder is full. Problems include:  Urine leakage (incontinence).  Inability to empty your bladder completely (retention).  Rapid or irregular heartbeat (palpitations).  Blood pressure drops when you stand up (orthostatic hypotension). When you stand up you may feel:  Dizzy.  Weak.  Faint.  In men, inability to attain and maintain an erection.  In women, vaginal dryness and problems with decreased sexual desire and arousal.  Problems with body temperature regulation.  Increased or decreased sweating. Focal Neuropathy  Abnormal eye movements or abnormal alignment of both eyes.  Weakness in the wrist.  Foot drop. This results in an inability to lift the foot properly and abnormal walking or foot movement.  Paralysis on one side of your face (Bell palsy).  Chest or abdominal pain. Radiculoplexus Neuropathy  Sudden, severe pain in your hip, thigh, or buttocks.  Weakness and wasting of thigh muscles.  Difficulty rising from a seated position.  Abdominal swelling.  Unexplained weight loss (usually more than 10 lb [4.5 kg]). DIAGNOSIS  Peripheral Neuropathy Your senses may be tested. Sensory function testing can be done with:  A light touch using a monofilament.  A vibration with tuning fork.  A sharp sensation with a pin prick. Other tests that can help diagnose neuropathy are:  Nerve conduction velocity. This test checks the transmission of an electrical current through a nerve.  Electromyography. This shows how muscles  respond to electrical signals transmitted by nearby nerves.  Quantitative sensory testing. This is used to assess how your nerves respond to vibrations and changes in temperature. Autonomic Neuropathy Diagnosis is often based on reported symptoms. Tell your health care provider if you experience:   Dizziness.   Constipation.   Diarrhea.   Inappropriate urination or inability to urinate.   Inability to get or maintain an erection.  Tests that may be done include:   Electrocardiography or Holter monitor. These are tests that can help show problems with the heart rate or heart rhythm.   An X-ray exam may be done. Focal Neuropathy Diagnosis is made based on your symptoms and what your health care provider finds during your exam. Other tests may be done. They may include:  Nerve conduction velocities. This checks the transmission of electrical current through a nerve.  Electromyography. This shows how muscles respond to electrical signals transmitted by nearby nerves.  Quantitative sensory  testing. This test is used to assess how your nerves respond to vibration and changes in temperature. Radiculoplexus Neuropathy  Often the first thing is to eliminate any other issue or problems that might be the cause, as there is no stick test for diagnosis.  X-ray exam of your spine and lumbar region.  Spinal tap to rule out cancer.  MRI to rule out other lesions. TREATMENT  Once nerve damage occurs, it cannot be reversed. The goal of treatment is to keep the disease or nerve damage from getting worse and affecting more nerve fibers. Controlling your blood glucose level is the key. Most people with radiculoplexus neuropathy see at least a partial improvement over time. You will need to keep your blood glucose and HbA1c levels in the target range determined by your health care provider. Things that help control blood glucose levels include:   Blood glucose monitoring.   Meal planning.    Physical activity.   Diabetes medicine.  Over time, maintaining lower blood glucose levels helps lessen symptoms. Sometimes, prescription pain medicine is needed. HOME CARE INSTRUCTIONS:  Do not smoke.  Keep your blood glucose level in the range that you and your health care provider have determined acceptable for you.  Keep your blood pressure level in the range that you and your health care provider have determined acceptable for you.  Eat a well-balanced diet.  Be active every day.  Check your feet every day. SEEK MEDICAL CARE IF:   You have burning, stabbing, or aching pain in the legs or feet.  You are unable to feel pressure or pain in your feet.  You develop problems with digestion such as:  Nausea.  Vomiting.  Bloating.  Constipation.  Diarrhea.  Abdominal pain.  You have difficulty with urination, such as:  Incontinence.  Retention.  You have palpitations.  You develop orthostatic hypotension. When you stand up you may feel:  Dizzy.  Weak.  Faint.  You cannot attain and maintain an erection (in men).  You have vaginal dryness and problems with decreased sexual desire and arousal (in women).  You have severe pain in your thighs, legs, or buttocks.  You have unexplained weight loss. Document Released: 12/01/2001 Document Revised: 07/13/2013 Document Reviewed: 03/03/2013 [facility] Patient Information 2014 [place].

## 2014-03-14 NOTE — ED Provider Notes (Signed)
Medical screening examination/treatment/procedure(s) were performed by a resident physician or non-physician practitioner and as the supervising physician I was immediately available for consultation/collaboration.  Lynne Leader, MD    Gregor Hams, MD 03/14/14 [phone]

## 2014-03-15 NOTE — Progress Notes (Signed)
02/11/14 1619  SLP G-Codes **NOT FOR INPATIENT CLASS**  Functional Assessment Tool Used skilled clinical judgement via chart review  Functional Limitations Swallowing  Swallow Current Status (B0211) [place]  Swallow Goal Status (D5520) [facility]  Swallow Discharge Status (E0223) [place]  SLP Evaluations  $ SLP Speech Visit 1 Procedure  SLP Evaluations  $BSS Swallow 1 Procedure  Gabriel Rainwater MA, CCC-SLP [phone]

## 2014-06-21 ENCOUNTER — Emergency Department (HOSPITAL_COMMUNITY)
Admission: EM | Admit: 2014-06-21 | Discharge: 2014-06-21 | Disposition: A | Discharge status: Home / Self Care | Payer: Medicare Other | Attending: Emergency Medicine | Admitting: Emergency Medicine

## 2014-06-21 ENCOUNTER — Encounter (HOSPITAL_COMMUNITY): Payer: Self-pay | Admitting: Emergency Medicine

## 2014-06-21 DIAGNOSIS — Z79899 Other long term (current) drug therapy: Secondary | ICD-10-CM | POA: Insufficient documentation

## 2014-06-21 DIAGNOSIS — Z791 Long term (current) use of non-steroidal anti-inflammatories (NSAID): Secondary | ICD-10-CM | POA: Insufficient documentation

## 2014-06-21 DIAGNOSIS — I10 Essential (primary) hypertension: Secondary | ICD-10-CM | POA: Insufficient documentation

## 2014-06-21 DIAGNOSIS — R42 Dizziness and giddiness: Secondary | ICD-10-CM | POA: Diagnosis present

## 2014-06-21 DIAGNOSIS — M129 Arthropathy, unspecified: Secondary | ICD-10-CM | POA: Diagnosis not present

## 2014-06-21 DIAGNOSIS — E119 Type 2 diabetes mellitus without complications: Secondary | ICD-10-CM | POA: Diagnosis not present

## 2014-06-21 DIAGNOSIS — Z7982 Long term (current) use of aspirin: Secondary | ICD-10-CM | POA: Insufficient documentation

## 2014-06-21 LAB — I-STAT TROPONIN, ED: Troponin i, poc: 0 ng/mL (ref 0.00–0.08)

## 2014-06-21 LAB — CBC
HCT: 34.7 % — ABNORMAL LOW (ref 36.0–46.0)
Hemoglobin: 11.8 g/dL — ABNORMAL LOW (ref 12.0–15.0)
MCH: 30.1 pg (ref 26.0–34.0)
MCHC: 34 g/dL (ref 30.0–36.0)
MCV: 88.5 fL (ref 78.0–100.0)
Platelets: 286 10*3/uL (ref 150–400)
RBC: 3.92 MIL/uL (ref 3.87–5.11)
RDW: 12.4 % (ref 11.5–15.5)
WBC: 6.2 10*3/uL (ref 4.0–10.5)

## 2014-06-21 LAB — COMPREHENSIVE METABOLIC PANEL
ALT: 50 U/L — ABNORMAL HIGH (ref 0–35)
AST: 59 U/L — ABNORMAL HIGH (ref 0–37)
Albumin: 3.9 g/dL (ref 3.5–5.2)
Alkaline Phosphatase: 65 U/L (ref 39–117)
Anion gap: 14 (ref 5–15)
BUN: 18 mg/dL (ref 6–23)
CO2: 26 mEq/L (ref 19–32)
Calcium: 9.7 mg/dL (ref 8.4–10.5)
Chloride: 96 mEq/L (ref 96–112)
Creatinine, Ser: 0.99 mg/dL (ref 0.50–1.10)
GFR calc Af Amer: 67 mL/min — ABNORMAL LOW (ref >90)
GFR calc non Af Amer: 58 mL/min — ABNORMAL LOW (ref >90)
Glucose, Bld: 144 mg/dL — ABNORMAL HIGH (ref 70–99)
Potassium: 3.4 mEq/L — ABNORMAL LOW (ref 3.7–5.3)
Sodium: 136 mEq/L — ABNORMAL LOW (ref 137–147)
Total Bilirubin: 0.3 mg/dL (ref 0.3–1.2)
Total Protein: 8.6 g/dL — ABNORMAL HIGH (ref 6.0–8.3)

## 2014-06-21 LAB — DIFFERENTIAL
Basophils Absolute: 0.1 10*3/uL (ref 0.0–0.1)
Basophils Relative: 1 % (ref 0–1)
Eosinophils Absolute: 0.1 10*3/uL (ref 0.0–0.7)
Eosinophils Relative: 2 % (ref 0–5)
Lymphocytes Relative: 33 % (ref 12–46)
Lymphs Abs: 2 10*3/uL (ref 0.7–4.0)
Monocytes Absolute: 0.6 10*3/uL (ref 0.1–1.0)
Monocytes Relative: 10 % (ref 3–12)
Neutro Abs: 3.4 10*3/uL (ref 1.7–7.7)
Neutrophils Relative %: 54 % (ref 43–77)

## 2014-06-21 LAB — CBG MONITORING, ED: Glucose-Capillary: 136 mg/dL — ABNORMAL HIGH (ref 70–99)

## 2014-06-21 MED ORDER — SODIUM CHLORIDE 0.9 % IV BOLUS (SEPSIS)
1000.0000 mL | Freq: Once | INTRAVENOUS | Status: AC
  Administered 2014-06-21: 1000 mL via INTRAVENOUS

## 2014-06-21 NOTE — ED Provider Notes (Signed)
Pt seen and evaluated.  Discussed with R. [place], [place].  Pt c/o orthostasis today.  Not syncopal.  She restarted her Norvasc today at 1 PM. Symptoms started within an hour. Had previously been on losartan, and Norvasc. Had run out of her Norvasc for a month and it was refilled yesterday by her PCP. Asymptomatic here. Normal reassuring EKG and labs. Asymptomatic after fluids. Ambulatory. She will hold her Norvasc tonight. Restart 5 mg tomorrow night. If she is tolerating this last her to check her blood pressures and follow up with her primary care physician.  Tanna Furry, MD 06/21/14 2010

## 2014-06-21 NOTE — Discharge Instructions (Signed)

## 2014-06-21 NOTE — ED Notes (Signed)
PA at bedside.

## 2014-06-21 NOTE — ED Notes (Signed)
Pt reports going to pcp yesterday and was told bp was high. Took her bp meds today at 1300, then onset of dizziness at 1630 and pt felt slightly disoriented. Pt went to fire station and bp was elevated, reports symptoms than began to resolve after 30 mins-1hr. No deficits are noted at triage.

## 2014-06-21 NOTE — ED Provider Notes (Signed)
CSN: [id number]     Arrival date & time 06/21/14  1759 History   First MD Initiated Contact with Patient 06/21/14 1830     Chief Complaint  Patient presents with  . Dizziness     (Consider location/radiation/quality/duration/timing/severity/associated sxs/prior Treatment) HPI Comments: Patient presents to the emergency department with chief complaint of dizziness. She states that the episode was short lived and this afternoon. She states that it happened at 4:30. She states that she fell like she is going to pass out. She reports that she today she restarted taking her BP medications.  She states that she took the medicine at 1:00 pm.  She denies any vertigo, fevers, chills, chest pain, SOB, abdominal pain, or other symptoms.  There are no aggravating or alleviating factors.   The history is provided by the patient. No language interpreter was used.    Past Medical History  Diagnosis Date  . Hypertension   . Diabetes mellitus   . Arthritis   . Seizures    Past Surgical History  Procedure Laterality Date  . Abdominal hysterectomy     History reviewed. No pertinent family history. History  Substance Use Topics  . Smoking status: Never Smoker   . Smokeless tobacco: Never Used  . Alcohol Use: No   OB History   Grav Para Term Preterm Abortions TAB SAB Ect Mult Living                 Review of Systems  Constitutional: Negative for fever and chills.  Respiratory: Negative for shortness of breath.   Cardiovascular: Negative for chest pain.  Gastrointestinal: Negative for nausea, vomiting, diarrhea and constipation.  Genitourinary: Negative for dysuria.  All other systems reviewed and are negative.     Allergies  Review of patient's allergies indicates no known allergies.  Home Medications   Prior to Admission medications   Medication Sig Start Date End Date Taking? Authorizing Provider  ALPRAZolam Duanne Moron) 0.5 MG tablet Take 0.25-0.5 mg by mouth 2 (two) times daily as  needed for anxiety.    Historical Provider, MD  amLODipine (NORVASC) 10 MG tablet Take 10 mg by mouth daily.    Historical Provider, MD  aspirin EC 81 MG tablet Take 1 tablet (81 mg total) by mouth daily. 02/12/14   Ripudeep Krystal Eaton, MD  carbamazepine (EPITOL) 200 MG tablet Take 200 mg by mouth 2 (two) times daily.    Historical Provider, MD  cetirizine (ZYRTEC) 10 MG tablet Take 10 mg by mouth daily as needed for allergies.    Historical Provider, MD  chlorthalidone (HYGROTON) 25 MG tablet Take 25 mg by mouth daily.    Historical Provider, MD  diclofenac (CATAFLAM) 50 MG tablet Take 1 tablet (50 mg total) by mouth 3 (three) times daily. Prn pain 03/13/14   Janne Napoleon, NP  hydrochlorothiazide (HYDRODIURIL) 25 MG tablet Take 25 mg by mouth daily.    Historical Provider, MD  meclizine (ANTIVERT) 25 MG tablet Take 1 tablet (25 mg total) by mouth 3 (three) times daily as needed for dizziness. 02/12/14   Ripudeep Krystal Eaton, MD  metFORMIN (GLUCOPHAGE) 500 MG tablet Take 500 mg by mouth at bedtime.     Historical Provider, MD  olmesartan (BENICAR) 40 MG tablet Take 40 mg by mouth daily.    Historical Provider, MD  pregabalin (LYRICA) 100 MG capsule Take 1 capsule (100 mg total) by mouth 3 (three) times daily. 02/12/14   Ripudeep Krystal Eaton, MD  simvastatin (ZOCOR) 5 MG tablet  Take 1 tablet (5 mg total) by mouth at bedtime. 02/12/14   Ripudeep Krystal Eaton, MD  sucralfate (CARAFATE) 1 G tablet Take 1 g by mouth 4 (four) times daily -  with meals and at bedtime.    Historical Provider, MD  traMADol (ULTRAM-ER) 100 MG 24 hr tablet Take 1 tablet (100 mg total) by mouth daily. 03/13/14   Janne Napoleon, NP   BP 139/69  Pulse 88  Temp(Src) 98.2 F (36.8 C) (Oral)  Resp 18  SpO2 96% Physical Exam  Nursing note and vitals reviewed. Constitutional: She is oriented to person, place, and time. She appears well-developed and well-nourished.  HENT:  Head: Normocephalic and atraumatic.  Eyes: Conjunctivae and EOM are normal. Pupils are  equal, round, and reactive to light.  Neck: Normal range of motion. Neck supple.  Cardiovascular: Normal rate and regular rhythm.  Exam reveals no gallop and no friction rub.   No murmur heard. Normal rate on my exam  Pulmonary/Chest: Effort normal and breath sounds normal. No respiratory distress. She has no wheezes. She has no rales. She exhibits no tenderness.  CTAB  Abdominal: Soft. Bowel sounds are normal. She exhibits no distension and no mass. There is no tenderness. There is no rebound and no guarding.  Musculoskeletal: Normal range of motion. She exhibits no edema and no tenderness.  Neurological: She is alert and oriented to person, place, and time.  CN 3-12 intact, normal finger to nose, speech is clear, movements are goal oriented  Skin: Skin is warm and dry.  Psychiatric: She has a normal mood and affect. Her behavior is normal. Judgment and thought content normal.    ED Course  Procedures (including critical care time) Results for orders placed during the hospital encounter of 06/21/14  CBC      Result Value Ref Range   WBC 6.2  4.0 - 10.5 K/uL   RBC 3.92  3.87 - 5.11 MIL/uL   Hemoglobin 11.8 (*) 12.0 - 15.0 g/dL   HCT 34.7 (*) 36.0 - 46.0 %   MCV 88.5  78.0 - 100.0 fL   MCH 30.1  26.0 - 34.0 pg   MCHC 34.0  30.0 - 36.0 g/dL   RDW 12.4  11.5 - 15.5 %   Platelets 286  150 - 400 K/uL  DIFFERENTIAL      Result Value Ref Range   Neutrophils Relative % 54  43 - 77 %   Neutro Abs 3.4  1.7 - 7.7 K/uL   Lymphocytes Relative 33  12 - 46 %   Lymphs Abs 2.0  0.7 - 4.0 K/uL   Monocytes Relative 10  3 - 12 %   Monocytes Absolute 0.6  0.1 - 1.0 K/uL   Eosinophils Relative 2  0 - 5 %   Eosinophils Absolute 0.1  0.0 - 0.7 K/uL   Basophils Relative 1  0 - 1 %   Basophils Absolute 0.1  0.0 - 0.1 K/uL  COMPREHENSIVE METABOLIC PANEL      Result Value Ref Range   Sodium 136 (*) 137 - 147 mEq/L   Potassium 3.4 (*) 3.7 - 5.3 mEq/L   Chloride 96  96 - 112 mEq/L   CO2 26  19 - 32  mEq/L   Glucose, Bld 144 (*) 70 - 99 mg/dL   BUN 18  6 - 23 mg/dL   Creatinine, Ser 0.99  0.50 - 1.10 mg/dL   Calcium 9.7  8.4 - 10.5 mg/dL   Total Protein 8.6 (*)  6.0 - 8.3 g/dL   Albumin 3.9  3.5 - 5.2 g/dL   AST 59 (*) 0 - 37 U/L   ALT 50 (*) 0 - 35 U/L   Alkaline Phosphatase 65  39 - 117 U/L   Total Bilirubin 0.3  0.3 - 1.2 mg/dL   GFR calc non Af Amer 58 (*) >90 mL/min   GFR calc Af Amer 67 (*) >90 mL/min   Anion gap 14  5 - 15  I-STAT TROPOININ, ED      Result Value Ref Range   Troponin i, poc 0.00  0.00 - 0.08 ng/mL   Comment 3           CBG MONITORING, ED      Result Value Ref Range   Glucose-Capillary 136 (*) 70 - 99 mg/dL      Imaging Review No results found.   EKG Interpretation None      MDM   Final diagnoses:  Dizziness    Patient with dizziness.  Suspect that patient's dizziness is related to restarting her BP meds.  No weakness or evidence of stroke.  Negative troponin, and nonconcerning EKG.  Labs are reassuring.  Will check orthostatics.  Patient is symptom free now.  She states that she feel great now.  Patient seen by and discussed with Dr. Jeneen Rinks, who agrees that the patient can be discharged to home.  The patient feels well.  She is not in any apparent distress.  She is stable and ready for discharge.  Recommended that patient skip one losartan dose and take her norvasc at night.    Montine Circle, PA-C 06/21/14 2009

## 2014-06-29 NOTE — ED Provider Notes (Signed)
Medical screening examination/treatment/procedure(s) were conducted as a shared visit with non-physician practitioner(s) and myself.  I personally evaluated the patient during the encounter.   EKG Interpretation   Date/Time:  Wednesday June 21 2014 18:16:29 EDT Ventricular Rate:  87 PR Interval:  148 QRS Duration: 82 QT Interval:  372 QTC Calculation: 447 R Axis:   -47 Text Interpretation:  Normal sinus rhythm Left axis deviation Possible  Anterior infarct , age undetermined Abnormal ECG ED PHYSICIAN  INTERPRETATION AVAILABLE IN [facility] Confirmed by TEST, Record  ([id number]) on 06/23/2014 9:41:39 AM      Pt seen and evaluated.  D/W R.Browning PA.  Pt reports dizziness/Orthostasis today.  Restarted Amlodipine after being off for several weeks.  BP and symptoms improved after IV fluids.  Ambulatory without symptoms.  Will hold Amlodipine, and have pt return with any recurrence.   Tanna Furry, MD 06/29/14 [phone]

## 2014-12-14 ENCOUNTER — Other Ambulatory Visit (HOSPITAL_COMMUNITY): Payer: Self-pay | Admitting: Family Medicine

## 2014-12-14 DIAGNOSIS — Z1231 Encounter for screening mammogram for malignant neoplasm of breast: Secondary | ICD-10-CM

## 2014-12-19 ENCOUNTER — Other Ambulatory Visit: Payer: Self-pay | Admitting: Family Medicine

## 2014-12-19 DIAGNOSIS — R197 Diarrhea, unspecified: Secondary | ICD-10-CM

## 2014-12-19 DIAGNOSIS — R109 Unspecified abdominal pain: Secondary | ICD-10-CM

## 2014-12-19 DIAGNOSIS — K5793 Diverticulitis of intestine, part unspecified, without perforation or abscess with bleeding: Secondary | ICD-10-CM

## 2014-12-22 ENCOUNTER — Ambulatory Visit
Admission: RE | Admit: 2014-12-22 | Discharge: 2014-12-22 | Disposition: A | Discharge status: Home / Self Care | Payer: Medicare Other | Source: Ambulatory Visit | Attending: Family Medicine | Admitting: Family Medicine

## 2014-12-22 DIAGNOSIS — K5793 Diverticulitis of intestine, part unspecified, without perforation or abscess with bleeding: Secondary | ICD-10-CM

## 2014-12-22 DIAGNOSIS — R197 Diarrhea, unspecified: Secondary | ICD-10-CM

## 2014-12-22 DIAGNOSIS — R109 Unspecified abdominal pain: Secondary | ICD-10-CM

## 2014-12-22 MED ORDER — IOPAMIDOL (ISOVUE-300) INJECTION 61%
125.0000 mL | Freq: Once | INTRAVENOUS | Status: AC | PRN
  Administered 2014-12-22: 125 mL via INTRAVENOUS

## 2015-01-18 ENCOUNTER — Other Ambulatory Visit (HOSPITAL_COMMUNITY): Payer: Self-pay | Admitting: Family Medicine

## 2015-01-18 ENCOUNTER — Ambulatory Visit (HOSPITAL_COMMUNITY)
Admission: RE | Admit: 2015-01-18 | Discharge: 2015-01-18 | Disposition: A | Discharge status: Home / Self Care | Payer: Medicare Other | Source: Ambulatory Visit | Attending: Family Medicine | Admitting: Family Medicine

## 2015-01-18 DIAGNOSIS — Z1231 Encounter for screening mammogram for malignant neoplasm of breast: Secondary | ICD-10-CM

## 2015-02-07 ENCOUNTER — Telehealth: Payer: Self-pay | Admitting: *Deleted

## 2015-02-07 NOTE — Telephone Encounter (Signed)
Called patient and left a voice mail to call the clinic regarding referral for Hep C. She needs to schedule an appt for lab work, and once that is complete she will be given an appt with Dr. Linus Salmons in June. Myrtis Hopping

## 2015-02-08 ENCOUNTER — Other Ambulatory Visit: Payer: Medicare Other

## 2015-02-08 DIAGNOSIS — B182 Chronic viral hepatitis C: Secondary | ICD-10-CM

## 2015-02-08 LAB — CBC WITH DIFFERENTIAL/PLATELET
Basophils Absolute: 0 10*3/uL (ref 0.0–0.1)
Basophils Relative: 1 % (ref 0–1)
Eosinophils Absolute: 0.1 10*3/uL (ref 0.0–0.7)
Eosinophils Relative: 2 % (ref 0–5)
HCT: 35.1 % — ABNORMAL LOW (ref 36.0–46.0)
Hemoglobin: 12 g/dL (ref 12.0–15.0)
Lymphocytes Relative: 42 % (ref 12–46)
Lymphs Abs: 1.5 10*3/uL (ref 0.7–4.0)
MCH: 30.1 pg (ref 26.0–34.0)
MCHC: 34.2 g/dL (ref 30.0–36.0)
MCV: 88 fL (ref 78.0–100.0)
MPV: 8.8 fL (ref 8.6–12.4)
Monocytes Absolute: 0.4 10*3/uL (ref 0.1–1.0)
Monocytes Relative: 11 % (ref 3–12)
Neutro Abs: 1.6 10*3/uL — ABNORMAL LOW (ref 1.7–7.7)
Neutrophils Relative %: 44 % (ref 43–77)
Platelets: 264 10*3/uL (ref 150–400)
RBC: 3.99 MIL/uL (ref 3.87–5.11)
RDW: 12.5 % (ref 11.5–15.5)
WBC: 3.6 10*3/uL — ABNORMAL LOW (ref 4.0–10.5)

## 2015-02-08 LAB — COMPREHENSIVE METABOLIC PANEL
ALT: 51 U/L — ABNORMAL HIGH (ref 0–35)
AST: 46 U/L — ABNORMAL HIGH (ref 0–37)
Albumin: 4.1 g/dL (ref 3.5–5.2)
Alkaline Phosphatase: 57 U/L (ref 39–117)
BUN: 23 mg/dL (ref 6–23)
CO2: 27 mEq/L (ref 19–32)
Calcium: 10.1 mg/dL (ref 8.4–10.5)
Chloride: 99 mEq/L (ref 96–112)
Creat: 0.94 mg/dL (ref 0.50–1.10)
Glucose, Bld: 92 mg/dL (ref 70–99)
Potassium: 3.7 mEq/L (ref 3.5–5.3)
Sodium: 138 mEq/L (ref 135–145)
Total Bilirubin: 0.4 mg/dL (ref 0.2–1.2)
Total Protein: 8.4 g/dL — ABNORMAL HIGH (ref 6.0–8.3)

## 2015-02-08 LAB — IRON: Iron: 174 ug/dL — ABNORMAL HIGH (ref 42–145)

## 2015-02-09 LAB — PROTIME-INR
INR: 1.14 (ref <1.50)
Prothrombin Time: 14.6 seconds (ref 11.6–15.2)

## 2015-02-09 LAB — ANA: Anti Nuclear Antibody(ANA): NEGATIVE

## 2015-02-09 LAB — HIV ANTIBODY (ROUTINE TESTING W REFLEX): HIV 1&2 Ab, 4th Generation: NONREACTIVE

## 2015-02-09 LAB — HEPATITIS B CORE ANTIBODY, TOTAL: Hep B Core Total Ab: NONREACTIVE

## 2015-02-13 LAB — HEPATITIS C GENOTYPE

## 2015-03-14 ENCOUNTER — Encounter: Payer: Self-pay | Admitting: Internal Medicine

## 2015-03-14 ENCOUNTER — Ambulatory Visit (INDEPENDENT_AMBULATORY_CARE_PROVIDER_SITE_OTHER): Payer: Medicare Other | Admitting: Internal Medicine

## 2015-03-14 VITALS — BP 156/87 | HR 98 | Temp 98.2°F | Ht 64.0 in | Wt 256.0 lb

## 2015-03-14 DIAGNOSIS — B182 Chronic viral hepatitis C: Secondary | ICD-10-CM | POA: Diagnosis not present

## 2015-03-14 DIAGNOSIS — Z23 Encounter for immunization: Secondary | ICD-10-CM | POA: Diagnosis not present

## 2015-03-14 MED ORDER — LEDIPASVIR-SOFOSBUVIR 90-400 MG PO TABS: 1.0000 | ORAL_TABLET | Freq: Every day | ORAL | Status: DC

## 2015-03-14 NOTE — Patient Instructions (Signed)
Date 03/14/2015  Dear Ms Cathy Gonzalez, As discussed in the [facility], your hepatitis C therapy will include the following medications:          Harvoni 90mg /400mg  tablet:           Take 1 tablet by mouth once daily   Please note that ALL MEDICATIONS WILL START ON THE SAME DATE for a total of 12 weeks. ---------------------------------------------------------------- Your HCV Treatment Start Date: TBA   Your HCV genotype:  1a    Liver Fibrosis: TBD    ---------------------------------------------------------------- YOUR PHARMACY CONTACT:   Cathy Gonzalez and Cathy Gonzalez Phone: [phone] Hours: Monday to Friday 7:30 am to 6:00 pm   Please always contact your pharmacy at least 3-4 business days before you run out of medications to ensure your next month's medication is ready or 1 week prior to running out if you receive it by mail.  Remember, each prescription is for 28 days. ---------------------------------------------------------------- GENERAL NOTES REGARDING YOUR HEPATITIS C MEDICATION:  SOFOSBUVIR/LEDIPASVIR (HARVONI): - Harvoni tablet is taken daily with OR without food. - The tablets are orange. - The tablets should be stored at room temperature.  - Acid reducing agents such as H2 blockers (ie. Pepcid (famotidine), Zantac (ranitidine), Tagamet (cimetidine), Axid (nizatidine) and proton pump inhibitors (ie. Prilosec (omeprazole), Protonix (pantoprazole), Nexium (esomeprazole), or Aciphex (rabeprazole)) can decrease effectiveness of Harvoni. Do not take until you have discussed with a health care provider.    -Antacids that contain magnesium and/or aluminum hydroxide (ie. Milk of Magensia, Rolaids, Gaviscon, Maalox, Mylanta, an dArthritis Pain Formula)can reduce absorption of Harvoni, so take them at least 4 hours before or after Harvoni.  -Calcium carbonate (calcium supplements or antacids such as Tums, Caltrate,  Os-Cal)needs to be taken at least 4 hours hours before or after Harvoni.  -St. John's wort or any products that contain St. John's wort like some herbal supplements  Please inform the office prior to starting any of these medications.  - The common side effects with Harvoni:      1. Fatigue      2. Headache      3. Nausea      4. Diarrhea      5. Insomnia   Support Path is a suite of resources designed to help patients start with HARVONI and move toward treatment completion [place] helps patients access therapy and get off to an efficient start  Benefits investigation and prior authorization support Co-pay and other financial assistance A specialty pharmacy finder CO-PAY COUPON The [place] co-pay coupon may help eligible patients lower their out-of-pocket costs. With a co-pay coupon, most eligible patients may pay no more than $5 per co-pay (restrictions apply) www.harvoni.com call 718-850-1063 Not valid for patients enrolled in government healthcare prescription drug programs, such as Medicare Part D and Medicaid. Patients in the coverage gap known as the "donut hole" also are not eligible The HARVONI co-pay coupon program will cover the out-of-pocket costs for HARVONI prescriptions up to a maximum of 25% of the catalog price of a 12-week regimen of HARVONI  Please note that this only lists the most common side effects and is NOT a comprehensive list of the potential side effects of these medications. For more information, please review the drug information sheets that come with your medication package from the pharmacy.  ---------------------------------------------------------------- GENERAL HELPFUL HINTS ON HCV THERAPY: 1. No alcohol. 2. Protect against sun-sensitivity/sunburns (wear sunglasses, hat, long sleeves, pants  and sunscreen). 3. Stay well-hydrated/well-moisturized. 4. Notify the [facility] of any changes in your other over-the-counter/herbal or  prescription medications. 5. If you miss a dose of your medication, take the missed dose as soon as you remember. Return to your regular time/dose schedule the next day.  6.  Do not stop taking your medications without first talking with your healthcare provider. 7.  You may take Tylenol (acetaminophen), as long as the dose is less than 2000 mg (OR no more than 4 tablets of the Tylenol Extra Strengths '500mg'$  tablet) in 24 hours. 8.  You will need to obtain routine labs and/or office visits at [facility] at weeks 4 and 12 as well as 12 and 24 weeks after completion of treatment.   Cathy Gonzalez, Cathy Gonzalez for Cathy Gonzalez, [name]  [phone]

## 2015-03-14 NOTE — Progress Notes (Signed)
+Cathy Gonzalez is a 69 y.o. female who presents for initial evaluation and management of a positive Hepatitis C antibody test.  Patient tested positive eearlier this year. Hepatitis C risk factors present are: none. Patient denies intranasal drug use, IV drug abuse, multiple sexual partners, renal dialysis, sexual contact with person with liver disease, tattoos. Patient has had other studies performed. Results: hepatitis C RNA by PCR, result: positive. Patient has not had prior treatment for Hepatitis C. Patient does not have a past history of liver disease. Patient does not have a family history of liver disease.   HPI: She has no drug or alcohol abuse history.  Some transaminitis.  Unfortunately, is taking carbamezapine which will need to be changed.    Patient does not have documented immunity to Hepatitis A. Patient does not have documented immunity to Hepatitis B.     Review of Systems A comprehensive review of systems was negative.   Past Medical History  Diagnosis Date  . Hypertension   . Diabetes mellitus   . Arthritis   . Seizures     Prior to Admission medications   Medication Sig Start Date End Date Taking? Authorizing Provider  amLODipine (NORVASC) 10 MG tablet Take 10 mg by mouth daily.   Yes Historical Provider, MD  aspirin EC 81 MG tablet Take 1 tablet (81 mg total) by mouth daily. 02/12/14  Yes Ripudeep Krystal Eaton, MD  carbamazepine (EPITOL) 200 MG tablet Take 200 mg by mouth 2 (two) times daily.   Yes Historical Provider, MD  cetirizine (ZYRTEC) 10 MG tablet Take 10 mg by mouth daily as needed for allergies.   Yes Historical Provider, MD  chlorthalidone (HYGROTON) 25 MG tablet Take 25 mg by mouth daily.   Yes Historical Provider, MD  colchicine 0.6 MG tablet Take 0.6 mg by mouth 2 (two) times daily.   Yes Historical Provider, MD  diclofenac (CATAFLAM) 50 MG tablet Take 1 tablet (50 mg total) by mouth 3 (three) times daily. Prn pain 03/13/14  Yes Janne Napoleon, NP  metFORMIN  (GLUCOPHAGE) 500 MG tablet Take 500 mg by mouth at bedtime.    Yes Historical Provider, MD  pregabalin (LYRICA) 100 MG capsule Take 1 capsule (100 mg total) by mouth 3 (three) times daily. 02/12/14  Yes Ripudeep Krystal Eaton, MD  sucralfate (CARAFATE) 1 G tablet Take 1 g by mouth 4 (four) times daily -  with meals and at bedtime.   Yes Historical Provider, MD  tiZANidine (ZANAFLEX) 4 MG tablet Take 4 mg by mouth daily.   Yes Historical Provider, MD  valsartan (DIOVAN) 320 MG tablet Take 320 mg by mouth daily.   Yes Historical Provider, MD  Ledipasvir-Sofosbuvir (HARVONI) 90-400 MG TABS Take 1 tablet by mouth daily. 03/14/15   Thayer Headings, MD    No Known Allergies  History  Substance Use Topics  . Smoking status: Never Smoker   . Smokeless tobacco: Never Used  . Alcohol Use: No    No family history on file.    Objective:   Filed Vitals:   03/14/15 1543  BP: 156/87  Pulse: 98  Temp: 98.2 F (36.8 C)   in no apparent distress and alert HEENT: anicteric Cor RRR and No murmurs clear Bowel sounds are normal, liver is not enlarged, spleen is not enlarged peripheral pulses normal, no pedal edema, no clubbing or cyanosis negative for - jaundice, spider hemangioma, telangiectasia, palmar erythema, ecchymosis and atrophy  Laboratory Genotype:  Lab Results  Component Value Date  HCVGENOTYPE 1a 02/08/2015   HCV viral load: No results found for: HCVQUANT Lab Results  Component Value Date   WBC 3.6* 02/08/2015   HGB 12.0 02/08/2015   HCT 35.1* 02/08/2015   MCV 88.0 02/08/2015   PLT 264 02/08/2015    Lab Results  Component Value Date   CREATININE 0.94 02/08/2015   BUN 23 02/08/2015   NA 138 02/08/2015   K 3.7 02/08/2015   CL 99 02/08/2015   CO2 27 02/08/2015    Lab Results  Component Value Date   ALT 51* 02/08/2015   AST 46* 02/08/2015   ALKPHOS 57 02/08/2015   BILITOT 0.4 02/08/2015   INR 1.14 02/08/2015      Assessment: Chronic Hepatitis C genotype 1a  Plan: 1)  Patient counseled extensively on limiting acetaminophen to no more than 2 grams daily, avoidance of alcohol. 2) Transmission discussed with patient including sexual transmission, sharing razors and toothbrush.   3) Will need referral to gastroenterology if concern for cirrhosis 4) Will need referral for substance abuse counseling: No. 5) Will prescribe Harvoni for 12 weeks now 6) Hepatitis A vaccine Yes.   7) Hepatitis B vaccine Yes.   8) Pneumovax vaccine if concern for cirrhosis 9) will follow up after elastography

## 2015-04-10 ENCOUNTER — Ambulatory Visit (HOSPITAL_COMMUNITY): Payer: Medicare Other

## 2015-04-17 ENCOUNTER — Ambulatory Visit (HOSPITAL_COMMUNITY)
Admission: RE | Admit: 2015-04-17 | Discharge: 2015-04-17 | Disposition: A | Discharge status: Home / Self Care | Payer: Medicare Other | Source: Ambulatory Visit | Attending: Internal Medicine | Admitting: Internal Medicine

## 2015-04-17 DIAGNOSIS — B182 Chronic viral hepatitis C: Secondary | ICD-10-CM | POA: Diagnosis present

## 2015-04-19 ENCOUNTER — Ambulatory Visit (INDEPENDENT_AMBULATORY_CARE_PROVIDER_SITE_OTHER): Payer: Medicare Other | Admitting: Internal Medicine

## 2015-04-19 ENCOUNTER — Encounter: Payer: Self-pay | Admitting: Internal Medicine

## 2015-04-19 VITALS — BP 126/79 | HR 94 | Temp 97.7°F | Ht 64.0 in | Wt 257.0 lb

## 2015-04-19 DIAGNOSIS — B182 Chronic viral hepatitis C: Secondary | ICD-10-CM | POA: Diagnosis not present

## 2015-04-19 DIAGNOSIS — Z23 Encounter for immunization: Secondary | ICD-10-CM

## 2015-04-19 NOTE — Assessment & Plan Note (Signed)
Discussed results.  She also knows she needs to be off of carbamezapine (absolute contraindication to all hep C medications) prior to starting and is getting a neurology referral from her PCP.  Can take Keppra.   She is going to call us after she sees neurology and we will start the medication process once that happens.

## 2015-04-19 NOTE — Progress Notes (Signed)
   Subjective:    Patient ID: Cathy Gonzalez, female    DOB: 01-Feb-1946, 69 y.o.   MRN: [id number]  HPI She comes in for follow up of her hepatitis C.  She has genotype 1a, elastography with F3/4 and ultrasound with nodular hepatic contour concerning for cirrhosis.  Does not drink alcohol.    Review of Systems  Constitutional: Negative for fatigue.  Gastrointestinal: Negative for nausea and diarrhea.  Skin: Negative for rash.  Neurological: Negative for dizziness and light-headedness.       Objective:   Physical Exam  Constitutional: She appears well-developed and well-nourished. No distress.  Eyes: No scleral icterus.  Cardiovascular: Normal rate, regular rhythm and normal heart sounds.   No murmur heard. Pulmonary/Chest: Effort normal and breath sounds normal. No respiratory distress.  Skin: No rash noted.          Assessment & Plan:

## 2015-05-14 ENCOUNTER — Ambulatory Visit (INDEPENDENT_AMBULATORY_CARE_PROVIDER_SITE_OTHER): Payer: Medicare Other | Admitting: Neurology

## 2015-05-14 ENCOUNTER — Encounter: Payer: Self-pay | Admitting: Neurology

## 2015-05-14 ENCOUNTER — Other Ambulatory Visit: Payer: Self-pay | Admitting: Pharmacist Clinician (PhC)/ Clinical Pharmacy Specialist

## 2015-05-14 ENCOUNTER — Telehealth: Payer: Self-pay | Admitting: Neurology

## 2015-05-14 ENCOUNTER — Encounter: Payer: Self-pay | Admitting: Pharmacist Clinician (PhC)/ Clinical Pharmacy Specialist

## 2015-05-14 VITALS — BP 144/81 | HR 73 | Ht 64.0 in | Wt 256.4 lb

## 2015-05-14 DIAGNOSIS — G40909 Epilepsy, unspecified, not intractable, without status epilepticus: Secondary | ICD-10-CM

## 2015-05-14 DIAGNOSIS — R569 Unspecified convulsions: Secondary | ICD-10-CM | POA: Insufficient documentation

## 2015-05-14 HISTORY — DX: Unspecified convulsions: R56.9

## 2015-05-14 MED ORDER — LEVETIRACETAM 500 MG PO TABS: ORAL_TABLET | ORAL | Status: DC

## 2015-05-14 MED ORDER — LEDIPASVIR-SOFOSBUVIR 90-400 MG PO TABS: 1.0000 | ORAL_TABLET | Freq: Every day | ORAL | Status: DC

## 2015-05-14 NOTE — Progress Notes (Signed)
Reason for visit: Nocturnal seizures  Referring physician: Dr. Jay Schlichter Cathy Gonzalez is a 69 y.o. female  History of present illness:  Ms. Moroney is a 69 year old black female with a history of morbid obesity and sleep apnea. The patient indicates that she has had nocturnal seizures since the late 1970s, she has been on carbamazepine since that time with excellent control. The patient had no seizures until about 18 months ago, she indicates that over the last 6 months, she has had about 10 such seizures. She will bite her tongue at night, and she does not lose control of the bowels or the bladder. She lives alone, she indicates that her seizures have not been witnessed. She operates a Teacher, music without difficulty. She has never had a seizure while awake. She does not recall ever having an EEG study. She last had a carbamazepine level on 02/11/2014, the level was 8.9. She has recently been diagnosed with sleep apnea, but she is not on CPAP as she does not have a mask that fits her at this time. She is trying to get this piece of durable medical equipment. She reports no focal numbness or weakness of the face, arms, or legs. She does have a history of a peripheral neuropathy, and she is on Lyrica for this taking 100 mg 3 times daily. She recently was diagnosed with hepatitis C, and she may be going on a medication for this. Her doctor indicated that they would like her to come off of the carbamazepine because of this. She is sent to this office for further evaluation.  Past Medical History  Diagnosis Date  . Hypertension   . Diabetes mellitus   . Arthritis   . Hepatitis C   . Hypokalemia   . Nocturnal seizures 05/14/2015  . Morbidly obese   . OSA on CPAP     Past Surgical History  Procedure Laterality Date  . Abdominal hysterectomy    . Cataract extraction Left     Family History  Problem Relation Age of Onset  . Diabetes Sister   . Seizures Neg Hx   . Diabetes Brother   . Cancer  Sister     breast    Social history:  reports that she has never smoked. She has never used smokeless tobacco. She reports that she does not drink alcohol or use illicit drugs.  Medications:  Prior to Admission medications   Medication Sig Start Date End Date Taking? Authorizing Provider  amLODipine (NORVASC) 10 MG tablet Take 10 mg by mouth daily.   Yes Historical Provider, MD  aspirin EC 81 MG tablet Take 1 tablet (81 mg total) by mouth daily. 02/12/14  Yes Ripudeep Krystal Eaton, MD  carbamazepine (EPITOL) 200 MG tablet Take 200 mg by mouth 2 (two) times daily.   Yes Historical Provider, MD  chlorthalidone (HYGROTON) 25 MG tablet Take 25 mg by mouth daily.   Yes Historical Provider, MD  colchicine 0.6 MG tablet Take 0.6 mg by mouth 2 (two) times daily.   Yes Historical Provider, MD  metFORMIN (GLUCOPHAGE) 500 MG tablet Take 500 mg by mouth at bedtime.    Yes Historical Provider, MD  montelukast (SINGULAIR) 10 MG tablet Take 10 mg by mouth at bedtime.   Yes Historical Provider, MD  pregabalin (LYRICA) 100 MG capsule Take 1 capsule (100 mg total) by mouth 3 (three) times daily. 02/12/14  Yes Ripudeep K Rai, MD  sucralfate (CARAFATE) 1 G tablet Take 1 g by mouth  4 (four) times daily -  with meals and at bedtime.   Yes Historical Provider, MD  tiZANidine (ZANAFLEX) 4 MG tablet Take 4 mg by mouth daily.   Yes Historical Provider, MD  valsartan (DIOVAN) 320 MG tablet Take 320 mg by mouth daily.   Yes Historical Provider, MD     No Known Allergies  ROS:  Out of a complete 14 system review of symptoms, the patient complains only of the following symptoms, and all other reviewed systems are negative.  Weight gain Snoring Blurred vision Joint pain Slurred speech, seizures  Blood pressure 144/81, pulse 73, height 5\' 4"  (1.626 m), weight 256 lb 6.4 oz (116.302 kg).  Physical Exam  General: The patient is alert and cooperative at the time of the examination. The patient is markedly obese.  Eyes:  Pupils are equal, round, and reactive to light. Discs are flat bilaterally.  Neck: The neck is supple, no carotid bruits are noted.  Respiratory: The respiratory examination is clear.  Cardiovascular: The cardiovascular examination reveals a regular rate and rhythm, no obvious murmurs or rubs are noted.  Skin: Extremities are without significant edema.  Neurologic Exam  Mental status: The patient is alert and oriented x 3 at the time of the examination. The patient has apparent normal recent and remote memory, with an apparently normal attention span and concentration ability.  Cranial nerves: Facial symmetry is present. There is good sensation of the face to pinprick and soft touch bilaterally. The strength of the facial muscles and the muscles to head turning and shoulder shrug are normal bilaterally. Speech is well enunciated, no aphasia or dysarthria is noted. Extraocular movements are full. Visual fields are full. The tongue is midline, and the patient has symmetric elevation of the soft palate. No obvious hearing deficits are noted.  Motor: The motor testing reveals 5 over 5 strength of all 4 extremities. Good symmetric motor tone is noted throughout.  Sensory: Sensory testing is intact to pinprick, soft touch, vibration sensation, and position sense on all 4 extremities, with exception of a stocking pattern pinprick sensory deficit across the ankles bilaterally. No evidence of extinction is noted.  Coordination: Cerebellar testing reveals good finger-nose-finger and heel-to-shin bilaterally.  Gait and station: Gait is normal. Tandem gait is unsteady. Romberg is negative. No drift is seen.  Reflexes: Deep tendon reflexes are symmetric, but are depressed bilaterally. Toes are downgoing bilaterally.   MRI brain 02/12/14:  IMPRESSION: 1. Age advanced periventricular and subcortical white matter disease. This is nonspecific, but likely reflects the sequela of chronic microvascular  ischemia. 2. No acute intracranial abnormality. 3. Normal variant circle of Thinh Cuccaro without evidence for significant proximal stenosis, aneurysm, or branch vessel occlusion.  * MRI scan images were reviewed online. I agree with the written report.    Assessment/Plan:  1. Nocturnal seizures  2. Morbid obesity  3. Sleep apnea on CPAP  4. Diabetes  5. Hepatitis C  The patient has had poor control of her seizures over the last 12-18 months. The onset of the sleep apnea may have some impact on this, the patient currently is not being treated for the sleep apnea. She is to try to get the durable medical equipment that she needs to initiate treatment. The patient has hepatitis C, and her doctors have indicated they wish her to come off of carbamazepine. We will initiate Keppra at this time, she will start 500 mg twice daily for one week, go to 750 mg twice daily. After another week,  she will initiate a taper off of the carbamazepine, taking 100 mg twice daily for 3 weeks, and then stop the medication. She will follow-up in 4 months. A sleep deprived EEG study was ordered.  Jill Alexanders MD 05/14/2015 10:09 AM  [facility] [address] [place], [place] [address]  Phone [phone] Fax [phone]

## 2015-05-14 NOTE — Telephone Encounter (Signed)
Pt has questions regarding EEG, should she fast or can she eat. Paperwork states she can have a 2 hour nap during that time, can she take nap between 12-2am ? How long does test take?

## 2015-05-14 NOTE — Patient Instructions (Addendum)
We will start a new medication called Keppra for the seizures. You are to start on a 500 mg tablet twice daily for one week, then go to 1-1/2 tablets twice daily. After another week, we will start a taper off of the carbamazepine, go down to one half tablet twice daily for 3 weeks, then stop the medication. If you have any problems with seizures, please contact our office. We will get she set up for an EEG study through this office. We will follow-up in about 4 months.  Epilepsy Epilepsy is a disorder in which a person has repeated seizures over time. A seizure is a release of abnormal electrical activity in the brain. Seizures can cause a change in attention, behavior, or the ability to remain awake and alert (altered mental status). Seizures often involve uncontrollable shaking (convulsions).  Most people with epilepsy lead normal lives. However, people with epilepsy are at an increased risk of falls, accidents, and injuries. Therefore, it is important to begin treatment right away. CAUSES  Epilepsy has many possible causes. Anything that disturbs the normal pattern of brain cell activity can lead to seizures. This may include:   Head injury.  Birth trauma.  High fever as a child.  Stroke.  Bleeding into or around the brain.  Certain drugs.  Prolonged low oxygen, such as what occurs after CPR efforts.  Abnormal brain development.  Certain illnesses, such as meningitis, encephalitis (brain infection), malaria, and other infections.  An imbalance of nerve signaling chemicals (neurotransmitters).  SIGNS AND SYMPTOMS  The symptoms of a seizure can vary greatly from one person to another. Right before a seizure, you may have a warning (aura) that a seizure is about to occur. An aura may include the following symptoms:  Fear or anxiety.  Nausea.  Feeling like the room is spinning (vertigo).  Vision changes, such as seeing flashing lights or spots. Common symptoms during a  seizure include:  Abnormal sensations, such as an abnormal smell or a bitter taste in the mouth.   Sudden, general body stiffness.   Convulsions that involve rhythmic jerking of the face, arm, or leg on one or both sides.   Sudden change in consciousness.   Appearing to be awake but not responding.   Appearing to be asleep but cannot be awakened.   Grimacing, chewing, lip smacking, drooling, tongue biting, or loss of bowel or bladder control. After a seizure, you may feel sleepy for a while. DIAGNOSIS  Your health care provider will ask about your symptoms and take a medical history. Descriptions from any witnesses to your seizures will be very helpful in the diagnosis. A physical exam, including a detailed neurological exam, is necessary. Various tests may be done, such as:   An electroencephalogram (EEG). This is a painless test of your brain waves. In this test, a diagram is created of your brain waves. These diagrams can be interpreted by a specialist.  An MRI of the brain.   A CT scan of the brain.   A spinal tap (lumbar puncture, LP).  Blood tests to check for signs of infection or abnormal blood chemistry. TREATMENT  There is no cure for epilepsy, but it is generally treatable. Once epilepsy is diagnosed, it is important to begin treatment as soon as possible. For most people with epilepsy, seizures can be controlled with medicines. The following may also be used:  A pacemaker for the brain (vagus nerve stimulator) can be used for people with seizures that are  not well controlled by medicine.  Surgery on the brain. For some people, epilepsy eventually goes away. HOME CARE INSTRUCTIONS   Follow your health care provider's recommendations on driving and safety in normal activities.  Get enough rest. Lack of sleep can cause seizures.  Only take over-the-counter or prescription medicines as directed by your health care provider. Take any prescribed medicine  exactly as directed.  Avoid any known triggers of your seizures.  Keep a seizure diary. Record what you recall about any seizure, especially any possible trigger.   Make sure the people you live and work with know that you are prone to seizures. They should receive instructions on how to help you. In general, a witness to a seizure should:   Cushion your head and body.   Turn you on your side.   Avoid unnecessarily restraining you.   Not place anything inside your mouth.   Call for emergency medical help if there is any question about what has occurred.   Follow up with your health care provider as directed. You may need regular blood tests to monitor the levels of your medicine.  SEEK MEDICAL CARE IF:   You develop signs of infection or other illness. This might increase the risk of a seizure.   You seem to be having more frequent seizures.   Your seizure pattern is changing.  SEEK IMMEDIATE MEDICAL CARE IF:   You have a seizure that does not stop after a few moments.   You have a seizure that causes any difficulty in breathing.   You have a seizure that results in a very severe headache.   You have a seizure that leaves you with the inability to speak or use a part of your body.  Document Released: 09/22/2005 Document Revised: 07/13/2013 Document Reviewed: 05/04/2013 [facility] Patient Information 2015 [place], [place]. This information is not intended to replace advice given to you by your health care provider. Make sure you discuss any questions you have with your health care provider.

## 2015-05-14 NOTE — Telephone Encounter (Signed)
I spoke to [place] and asked that she call the patient to explain the sleep deprived EEG. She stated she would be happy to.

## 2015-05-14 NOTE — Progress Notes (Signed)
Patient ID: Cathy Gonzalez, female   DOB: [birth year], 69 y.o.   MRN: [id number] HPI: Cathy Gonzalez is a 69 y.o. female who came in today to talk about her hep C meds.   Lab Results  Component Value Date   HCVGENOTYPE 1a 02/08/2015    Allergies: No Known Allergies  Vitals: BP: 144/81 mmHg (08/08 0906) Pulse Rate: 73 (08/08 0906)  Past Medical History: Past Medical History  Diagnosis Date  . Hypertension   . Diabetes mellitus   . Arthritis   . Hepatitis C   . Hypokalemia   . Nocturnal seizures 05/14/2015  . Morbidly obese   . OSA on CPAP   . Diabetic neuropathy, type II diabetes mellitus     Social History: History   Social History  . Marital Status: Single    Spouse Name: N/A  . Number of Children: 4  . Years of Education: 12   Occupational History  . [name]    Social History Main Topics  . Smoking status: Never Smoker   . Smokeless tobacco: Never Used  . Alcohol Use: No  . Drug Use: No  . Sexual Activity: No   Other Topics Concern  . Not on file   Social History Narrative   Patient occasionally drinks caffeine.   Patient is right handed.    Labs: No results found for: HIV1RNAQUANT, HIV1RNAVL, CD4TABS, HEPBSAB, HEPBSAG, HCVAB  Lab Results  Component Value Date   HCVGENOTYPE 1a 02/08/2015    No flowsheet data found.  AST (U/L)  Date Value  02/08/2015 46*  06/21/2014 59*  02/12/2014 34   ALT (U/L)  Date Value  02/08/2015 51*  06/21/2014 50*  02/12/2014 33   INR (no units)  Date Value  02/08/2015 1.14  02/11/2014 1.11    CrCl: CrCl cannot be calculated (Patient has no serum creatinine result on file.).  Fibrosis Score: F3/4 as assessed by [facility]FI  Child-Pugh Score: Class A  Previous Treatment Regimen: Naive  Assessment: She was recently evaluated for her hep C treatement. She has pretty moderate fibrosis. The plan was to use harvoni for the treatment but she is on carbamezepine which would interact with Harvoni severely. She has seen  her neurologist and she is being transition off of carbamezepine over 3 weeks while keppra is being titrated up. We are going to try to fill it now to prevent the PA from expiring. She knows not to start Harvoni until she is off of carbamezepine (8/31)  Recommendations: Start Harvoni 1 PO qday when off of CBZ   [place], [place], [place].D., BCPS, AAHIVP Clinical Infectious [facility] 05/14/2015, 2:20 PM

## 2015-05-17 ENCOUNTER — Encounter (HOSPITAL_COMMUNITY): Payer: Self-pay | Admitting: Pharmacy Technician

## 2015-05-25 ENCOUNTER — Telehealth: Payer: Self-pay | Admitting: Neurology

## 2015-05-25 ENCOUNTER — Ambulatory Visit (INDEPENDENT_AMBULATORY_CARE_PROVIDER_SITE_OTHER): Payer: Medicare Other | Admitting: Neurology

## 2015-05-25 DIAGNOSIS — G40909 Epilepsy, unspecified, not intractable, without status epilepticus: Secondary | ICD-10-CM | POA: Diagnosis not present

## 2015-05-25 DIAGNOSIS — R569 Unspecified convulsions: Secondary | ICD-10-CM

## 2015-05-25 NOTE — Telephone Encounter (Signed)
I called patient. EEG study was unremarkable. The patient did not get to sleep during the study, however. This was a sleep deprived study.

## 2015-05-25 NOTE — Procedures (Signed)
    History:  Cathy Gonzalez is a 69 year old patient with a history of sleep apnea and nocturnal seizures. The patient has had poor control of her seizures over the last 6 months with 10 seizures. She is being evaluated for these episodes.  This is a sleep deprived EEG recording. No skull defects are noted. Medications include Norvasc, aspirin, carbamazepine, chlorthalidone, colchicine, Harvoni, Keppra, Glucophage, Singulair, Lyrica, Carafate, tizanidine, and Diovan.  EEG classification: Normal awake and drowsy  Description of the recording: The background rhythms of this recording consists of a fairly well modulated medium amplitude alpha rhythm of 8 Hz that is reactive to eye opening and closure. As the record progresses, the patient appears to remain in the waking state throughout the recording. Photic stimulation was not performed. Hyperventilation was performed, resulting in a minimal buildup of the background rhythm activities without significant slowing seen. Toward the end of the recording, the patient enters the drowsy state with slight symmetric slowing seen. The patient never enters stage II sleep. At no time during the recording does there appear to be evidence of spike or spike wave discharges or evidence of focal slowing. EKG monitor shows no evidence of cardiac rhythm abnormalities with a heart rate of 66.  Impression: This is a normal EEG recording in the waking and drowsy state. No evidence of ictal or interictal discharges are seen.

## 2015-06-08 ENCOUNTER — Telehealth: Payer: Self-pay | Admitting: Lab

## 2015-06-08 NOTE — Telephone Encounter (Signed)
Pt will start meds Sept 6-appmt made to get labs 07/12/2015 and to see Dr Linus Salmons 07/26/2015

## 2015-06-08 NOTE — Telephone Encounter (Signed)
Pt called back advised by neurologist not t take until she finishes seizure pills which be Monday Sept 5. Pt will start Hep C meds on Tuesday 06/12/15

## 2015-06-08 NOTE — Telephone Encounter (Signed)
Lm for pt to call me regarding setting up appmts 6 wks after meds were picked up (05/15/15) to see the Dr and 2 wks before Dr's appmt she needs lab appmt

## 2015-07-12 ENCOUNTER — Other Ambulatory Visit: Payer: Medicare Other

## 2015-07-12 DIAGNOSIS — B182 Chronic viral hepatitis C: Secondary | ICD-10-CM

## 2015-07-12 LAB — COMPREHENSIVE METABOLIC PANEL
ALT: 56 U/L — ABNORMAL HIGH (ref 6–29)
AST: 63 U/L — ABNORMAL HIGH (ref 10–35)
Albumin: 3.8 g/dL (ref 3.6–5.1)
Alkaline Phosphatase: 78 U/L (ref 33–130)
BUN: 27 mg/dL — ABNORMAL HIGH (ref 7–25)
CO2: 30 mmol/L (ref 20–31)
Calcium: 9.7 mg/dL (ref 8.6–10.4)
Chloride: 103 mmol/L (ref 98–110)
Creat: 0.9 mg/dL (ref 0.50–0.99)
Glucose, Bld: 118 mg/dL — ABNORMAL HIGH (ref 65–99)
Potassium: 4 mmol/L (ref 3.5–5.3)
Sodium: 140 mmol/L (ref 135–146)
Total Bilirubin: 0.3 mg/dL (ref 0.2–1.2)
Total Protein: 7.3 g/dL (ref 6.1–8.1)

## 2015-07-12 LAB — CBC WITH DIFFERENTIAL/PLATELET
Basophils Absolute: 0 10*3/uL (ref 0.0–0.1)
Basophils Relative: 1 % (ref 0–1)
Eosinophils Absolute: 0.1 10*3/uL (ref 0.0–0.7)
Eosinophils Relative: 3 % (ref 0–5)
HCT: 35 % — ABNORMAL LOW (ref 36.0–46.0)
Hemoglobin: 11.9 g/dL — ABNORMAL LOW (ref 12.0–15.0)
Lymphocytes Relative: 41 % (ref 12–46)
Lymphs Abs: 1.6 10*3/uL (ref 0.7–4.0)
MCH: 29.5 pg (ref 26.0–34.0)
MCHC: 34 g/dL (ref 30.0–36.0)
MCV: 86.6 fL (ref 78.0–100.0)
MPV: 9.4 fL (ref 8.6–12.4)
Monocytes Absolute: 0.6 10*3/uL (ref 0.1–1.0)
Monocytes Relative: 15 % — ABNORMAL HIGH (ref 3–12)
Neutro Abs: 1.5 10*3/uL — ABNORMAL LOW (ref 1.7–7.7)
Neutrophils Relative %: 40 % — ABNORMAL LOW (ref 43–77)
Platelets: 237 10*3/uL (ref 150–400)
RBC: 4.04 MIL/uL (ref 3.87–5.11)
RDW: 13.2 % (ref 11.5–15.5)
WBC: 3.8 10*3/uL — ABNORMAL LOW (ref 4.0–10.5)

## 2015-07-13 LAB — HEPATITIS C RNA QUANTITATIVE: HCV Quantitative: NOT DETECTED IU/mL (ref <15)

## 2015-07-26 ENCOUNTER — Ambulatory Visit (INDEPENDENT_AMBULATORY_CARE_PROVIDER_SITE_OTHER): Payer: Medicare Other | Admitting: Internal Medicine

## 2015-07-26 ENCOUNTER — Encounter: Payer: Self-pay | Admitting: Internal Medicine

## 2015-07-26 VITALS — BP 142/81 | HR 68 | Temp 97.6°F | Wt 258.5 lb

## 2015-07-26 DIAGNOSIS — Z23 Encounter for immunization: Secondary | ICD-10-CM | POA: Diagnosis not present

## 2015-07-26 DIAGNOSIS — B182 Chronic viral hepatitis C: Secondary | ICD-10-CM | POA: Diagnosis not present

## 2015-07-26 DIAGNOSIS — R569 Unspecified convulsions: Secondary | ICD-10-CM

## 2015-07-26 DIAGNOSIS — G40909 Epilepsy, unspecified, not intractable, without status epilepticus: Secondary | ICD-10-CM | POA: Diagnosis not present

## 2015-07-26 DIAGNOSIS — K7460 Unspecified cirrhosis of liver: Secondary | ICD-10-CM | POA: Insufficient documentation

## 2015-07-26 DIAGNOSIS — K7400 Hepatic fibrosis, unspecified: Secondary | ICD-10-CM

## 2015-07-26 NOTE — Progress Notes (Signed)
   Subjective:    Patient ID: Cathy Gonzalez, female    DOB: February 03, 1946, 69 y.o.   MRN: [id number]  HPI She comes in for follow up of her hepatitis C.  She has genotype 1a, elastography with F3/4 and ultrasound with nodular hepatic contour concerning for cirrhosis.  Unknown duration.  Does not drink alcohol.   Sees Dr. Benson Norway of GI.  Started Harvoni about 7 weeks ago and feels much better, less associated fatigue.  Occasional mild headache relieved with rest.  Viral load now undetectable.  Pleased with the regimen.  Changed off of carbamazepine to Keppra and no recent night sz activity.     Review of Systems  Constitutional: Negative for fatigue.  Gastrointestinal: Negative for nausea and diarrhea.  Skin: Negative for rash.  Neurological: Negative for dizziness and light-headedness.       Objective:   Physical Exam  Constitutional: She appears well-developed and well-nourished. No distress.  Eyes: No scleral icterus.  Cardiovascular: Normal rate, regular rhythm and normal heart sounds.   No murmur heard. Pulmonary/Chest: Effort normal and breath sounds normal. No respiratory distress.  Skin: No rash noted.   Social History   Social History  . Marital Status: Single    Spouse Name: N/A  . Number of Children: 4  . Years of Education: 12   Occupational History  . [name]    Social History Main Topics  . Smoking status: Never Smoker   . Smokeless tobacco: Never Used  . Alcohol Use: No  . Drug Use: No  . Sexual Activity: No   Other Topics Concern  . Not on file   Social History Narrative   Patient occasionally drinks caffeine.   Patient is right handed.         Assessment & Plan:

## 2015-07-26 NOTE — Assessment & Plan Note (Signed)
Concerning for cirrhosis.  Getting colonoscopy with Dr. Benson Norway, ? EGD at the same time.  Will send note to Dr. Benson Norway.

## 2015-07-26 NOTE — Assessment & Plan Note (Signed)
Doing good on the new medication, no issues.

## 2015-07-26 NOTE — Assessment & Plan Note (Signed)
Doing great on Harvoni and will complete 12 weeks. Return after treatmen tcompletion for end of treatment lab.

## 2015-08-07 ENCOUNTER — Ambulatory Visit: Payer: Medicare Other | Admitting: Podiatry

## 2015-08-13 ENCOUNTER — Other Ambulatory Visit: Payer: Self-pay | Admitting: Gastroenterology

## 2015-08-21 ENCOUNTER — Encounter (HOSPITAL_COMMUNITY)
Admission: RE | Disposition: A | Discharge status: Home / Self Care | Payer: Self-pay | Source: Ambulatory Visit | Attending: Gastroenterology

## 2015-08-21 ENCOUNTER — Encounter (HOSPITAL_COMMUNITY): Payer: Self-pay

## 2015-08-21 ENCOUNTER — Ambulatory Visit (HOSPITAL_COMMUNITY)
Admission: RE | Admit: 2015-08-21 | Discharge: 2015-08-21 | Disposition: A | Discharge status: Home / Self Care | Payer: Medicare Other | Source: Ambulatory Visit | Attending: Gastroenterology | Admitting: Gastroenterology

## 2015-08-21 ENCOUNTER — Ambulatory Visit (HOSPITAL_COMMUNITY): Payer: Medicare Other | Admitting: Anesthesiology

## 2015-08-21 DIAGNOSIS — K3189 Other diseases of stomach and duodenum: Secondary | ICD-10-CM | POA: Insufficient documentation

## 2015-08-21 DIAGNOSIS — K7460 Unspecified cirrhosis of liver: Secondary | ICD-10-CM | POA: Diagnosis not present

## 2015-08-21 DIAGNOSIS — G4733 Obstructive sleep apnea (adult) (pediatric): Secondary | ICD-10-CM | POA: Insufficient documentation

## 2015-08-21 DIAGNOSIS — G4089 Other seizures: Secondary | ICD-10-CM | POA: Insufficient documentation

## 2015-08-21 DIAGNOSIS — Z7984 Long term (current) use of oral hypoglycemic drugs: Secondary | ICD-10-CM | POA: Insufficient documentation

## 2015-08-21 DIAGNOSIS — E119 Type 2 diabetes mellitus without complications: Secondary | ICD-10-CM | POA: Diagnosis not present

## 2015-08-21 DIAGNOSIS — D123 Benign neoplasm of transverse colon: Secondary | ICD-10-CM | POA: Diagnosis not present

## 2015-08-21 DIAGNOSIS — Z7982 Long term (current) use of aspirin: Secondary | ICD-10-CM | POA: Insufficient documentation

## 2015-08-21 DIAGNOSIS — Z6841 Body Mass Index (BMI) 40.0 and over, adult: Secondary | ICD-10-CM | POA: Diagnosis not present

## 2015-08-21 DIAGNOSIS — Z79899 Other long term (current) drug therapy: Secondary | ICD-10-CM | POA: Insufficient documentation

## 2015-08-21 DIAGNOSIS — Z1211 Encounter for screening for malignant neoplasm of colon: Secondary | ICD-10-CM | POA: Diagnosis not present

## 2015-08-21 DIAGNOSIS — K219 Gastro-esophageal reflux disease without esophagitis: Secondary | ICD-10-CM | POA: Insufficient documentation

## 2015-08-21 DIAGNOSIS — E1140 Type 2 diabetes mellitus with diabetic neuropathy, unspecified: Secondary | ICD-10-CM | POA: Diagnosis not present

## 2015-08-21 DIAGNOSIS — K766 Portal hypertension: Secondary | ICD-10-CM | POA: Diagnosis not present

## 2015-08-21 DIAGNOSIS — B1920 Unspecified viral hepatitis C without hepatic coma: Secondary | ICD-10-CM | POA: Diagnosis not present

## 2015-08-21 DIAGNOSIS — I10 Essential (primary) hypertension: Secondary | ICD-10-CM | POA: Diagnosis not present

## 2015-08-21 HISTORY — PX: ESOPHAGOGASTRODUODENOSCOPY (EGD) WITH PROPOFOL: SHX5813

## 2015-08-21 HISTORY — PX: COLONOSCOPY WITH PROPOFOL: SHX5780

## 2015-08-21 LAB — GLUCOSE, CAPILLARY: Glucose-Capillary: 85 mg/dL (ref 65–99)

## 2015-08-21 SURGERY — ESOPHAGOGASTRODUODENOSCOPY (EGD) WITH PROPOFOL
Anesthesia: Monitor Anesthesia Care

## 2015-08-21 MED ORDER — LIDOCAINE HCL (CARDIAC) 20 MG/ML IV SOLN
INTRAVENOUS | Status: AC
  Filled 2015-08-21: qty 5

## 2015-08-21 MED ORDER — LIDOCAINE HCL (CARDIAC) 20 MG/ML IV SOLN
INTRAVENOUS | Status: DC | PRN
  Administered 2015-08-21: 50 mg via INTRAVENOUS

## 2015-08-21 MED ORDER — LACTATED RINGERS IV SOLN
INTRAVENOUS | Status: DC | PRN
  Administered 2015-08-21: 14:00:00 via INTRAVENOUS

## 2015-08-21 MED ORDER — PROPOFOL 500 MG/50ML IV EMUL
INTRAVENOUS | Status: DC | PRN
  Administered 2015-08-21: 40 mg via INTRAVENOUS

## 2015-08-21 MED ORDER — PROPOFOL 10 MG/ML IV BOLUS
INTRAVENOUS | Status: AC
  Filled 2015-08-21: qty 20

## 2015-08-21 MED ORDER — SODIUM CHLORIDE 0.9 % IV SOLN: INTRAVENOUS | Status: DC

## 2015-08-21 MED ORDER — PROPOFOL 500 MG/50ML IV EMUL
INTRAVENOUS | Status: DC | PRN
  Administered 2015-08-21: 140 ug/kg/min via INTRAVENOUS

## 2015-08-21 SURGICAL SUPPLY — 25 items

## 2015-08-21 NOTE — Anesthesia Preprocedure Evaluation (Addendum)
Anesthesia Evaluation  Patient identified by MRN, date of birth, ID band Patient awake    Reviewed: Allergy & Precautions, NPO status   Airway Mallampati: I  TM Distance: >3 FB Neck ROM: Full    Dental   Pulmonary sleep apnea ,    Pulmonary exam normal        Cardiovascular hypertension, Pt. on medications Normal cardiovascular exam     Neuro/Psych    GI/Hepatic (+) Hepatitis -, C  Endo/Other  diabetes, Type 2  Renal/GU      Musculoskeletal   Abdominal   Peds  Hematology   Anesthesia Other Findings   Reproductive/Obstetrics                            Anesthesia Physical Anesthesia Plan  ASA: III  Anesthesia Plan: MAC   Post-op Pain Management:    Induction: Intravenous  Airway Management Planned: Simple Face Mask  Additional Equipment:   Intra-op Plan:   Post-operative Plan:   Informed Consent: I have reviewed the patients History and Physical, chart, labs and discussed the procedure including the risks, benefits and alternatives for the proposed anesthesia with the patient or authorized representative who has indicated his/her understanding and acceptance.     Plan Discussed with: CRNA and Surgeon  Anesthesia Plan Comments:         Anesthesia Quick Evaluation

## 2015-08-21 NOTE — H&P (Signed)
Cathy Gonzalez HPI: This is a 69 year old female here for a follow up colonoscopy.  Her colonoscopy on 09/2005 was negative for any abnormalities.  She also has a long history of GERD and she is currently receiving treatment for her HCV.  Her elastography was positive for cirrhosis.    Past Medical History  Diagnosis Date  . Hypertension   . Diabetes mellitus   . Arthritis   . Hepatitis C   . Hypokalemia   . Nocturnal seizures ([place]) 05/14/2015  . Morbidly obese ([place])   . OSA on CPAP   . Diabetic neuropathy, type II diabetes mellitus [facility])     Past Surgical History  Procedure Laterality Date  . Abdominal hysterectomy    . Cataract extraction Left     Family History  Problem Relation Age of Onset  . Diabetes Sister   . Seizures Neg Hx   . Diabetes Brother   . Cancer Sister     breast    Social History:  reports that she has never smoked. She has never used smokeless tobacco. She reports that she does not drink alcohol or use illicit drugs.  Allergies: No Known Allergies  Medications:  Prior to Admission:  Prescriptions prior to admission  Medication Sig Dispense Refill Last Dose  . amLODipine (NORVASC) 10 MG tablet Take 10 mg by mouth daily.   08/21/2015 at 0700  . chlorthalidone (HYGROTON) 25 MG tablet Take 25 mg by mouth daily.   08/20/2015 at 0800  . colchicine 0.6 MG tablet Take 0.6 mg by mouth daily.    08/20/2015 at 0800  . diclofenac sodium (VOLTAREN) 1 % GEL Apply 1 application topically 2 (two) times daily. Apply 2 inches of medication on affected joint and rub in well.  0 08/20/2015 at 0800  . Ledipasvir-Sofosbuvir (HARVONI) 90-400 MG TABS Take 1 tablet by mouth daily. 30 tablet 2 08/20/2015 at Unknown time  . levETIRAcetam (KEPPRA) 500 MG tablet One tablet twice a day for 1 week, then take 1.5 tablets twice a day (Patient taking differently: Take 750 mg by mouth 2 (two) times daily. ) 90 tablet 3 08/21/2015 at 0700  . metFORMIN (GLUCOPHAGE) 500 MG tablet Take 500 mg  by mouth at bedtime.    08/20/2015 at 2100  . montelukast (SINGULAIR) 10 MG tablet Take 10 mg by mouth at bedtime.   08/20/2015 at 2100  . pregabalin (LYRICA) 150 MG capsule Take 150-300 mg by mouth 2 (two) times daily. Takes 150mg  in the AM and 300mg  in the PM.   08/20/2015 at 2100  . sucralfate (CARAFATE) 1 G tablet Take 1 g by mouth 4 (four) times daily -  with meals and at bedtime.   08/20/2015 at 2100  . tiZANidine (ZANAFLEX) 4 MG tablet Take 4 mg by mouth at bedtime.    08/20/2015 at 2100  . valsartan (DIOVAN) 320 MG tablet Take 320 mg by mouth daily.   08/20/2015 at 0800  . aspirin EC 81 MG tablet Take 1 tablet (81 mg total) by mouth daily. 30 tablet 3 More than a month at Unknown time   Continuous: . sodium chloride      No results found for this or any previous visit (from the past 24 hour(s)).   No results found.  ROS:  As stated above in the HPI otherwise negative.  Blood pressure 171/90, pulse 66, temperature 97.9 F (36.6 C), temperature source Oral, resp. rate 18, height 5\' 4"  (1.626 m), weight 117.028 kg (258 lb),  SpO2 96 %.    PE: Gen: NAD, Alert and Oriented HEENT:  [place]/AT, EOMI Neck: Supple, no LAD Lungs: CTA Bilaterally CV: RRR without M/G/R ABM: Soft, NTND, +BS Ext: No C/C/E  Assessment/Plan: 1) Screening colonoscopy. 2) HCV cirrhosis  Plan: 1) EGD/Colonoscopy to screen for esophageal varices and CRC.  Keary Waterson D 08/21/2015, 1:35 PM

## 2015-08-21 NOTE — Op Note (Signed)
[place], [id number]   ENDOSCOPY PROCEDURE REPORT  PATIENT: Cathy Gonzalez, Cathy Gonzalez  MR#: [id number] BIRTHDATE: July 20, 1946 , 24  yrs. old GENDER: female ENDOSCOPIST:Rami Budhu Benson Norway, MD REFERRED BY: PROCEDURE DATE:  Aug 24, 2015 PROCEDURE:   EGD, diagnostic ASA CLASS:    Class III INDICATIONS: screening for varices. MEDICATION: Monitored anesthesia care TOPICAL ANESTHETIC:  DESCRIPTION OF PROCEDURE:   After the risks and benefits of the procedure were explained, informed consent was obtained.  The endoscope [id number]  endoscope was introduced through the mouth  and advanced to the second portion of the duodenum .  The instrument was slowly withdrawn as the mucosa was fully examined. Estimated blood loss is zero unless otherwise noted in this procedure report.   FINDINGS:No evidence of esophageal varices.  The gastric mucosa exhibited portal HTN gastropahy, but no fundic varices.  The duodenum was normal.          The scope was then withdrawn from the patient and the procedure completed.  COMPLICATIONS: There were no immediate complications.  ENDOSCOPIC IMPRESSION: 1) Portal HTN gastropathy.  RECOMMENDATIONS: 1) Repeat EGD in 3 years.   _______________________________ eSignedCarol Ada, MD August 24, 2015 3:02 PM     cc:  CPT CODES: ICD CODES:  The ICD and CPT codes recommended by this software are interpretations from the data that the clinical staff has captured with the software.  The verification of the translation of this report to the ICD and CPT codes and modifiers is the sole responsibility of the health care institution and practicing physician where this report was generated.  [place]. will not be held responsible for the validity of the ICD and CPT codes included on this report.  AMA assumes no liability for data contained or not contained herein. CPT is a Designer, television/film set of the [employer].

## 2015-08-21 NOTE — Discharge Instructions (Signed)
Esophagogastroduodenoscopy, Care After Refer to this sheet in the next few weeks. These instructions provide you with information about caring for yourself after your procedure. Your health care provider may also give you more specific instructions. Your treatment has been planned according to current medical practices, but problems sometimes occur. Call your health care provider if you have any problems or questions after your procedure. WHAT TO EXPECT AFTER THE PROCEDURE After your procedure, it is typical to feel:  Soreness in your throat.  Pain with swallowing.  Sick to your stomach (nauseous).  Bloated.  Dizzy.  Fatigued. HOME CARE INSTRUCTIONS  Do not eat or drink anything until the numbing medicine (local anesthetic) has worn off and your gag reflex has returned. You will know that the local anesthetic has worn off when you can swallow comfortably.  Do not drive or operate machinery until directed by your health care provider.  Take medicines only as directed by your health care provider. SEEK MEDICAL CARE IF:   You cannot stop coughing.  You are not urinating at all or less than usual. SEEK IMMEDIATE MEDICAL CARE IF:  You have difficulty swallowing.  You cannot eat or drink.  You have worsening throat or chest pain.  You have dizziness or lightheadedness or you faint.  You have nausea or vomiting.  You have chills.  You have a fever.  You have severe abdominal pain.  You have black, tarry, or bloody stools.   This information is not intended to replace advice given to you by your health care provider. Make sure you discuss any questions you have with your health care provider.   Document Released: 09/08/2012 Document Revised: 10/13/2014 Document Reviewed: 09/08/2012 Elsevier Interactive Patient Education 2016 [employer]. Colonoscopy, Care After These instructions give you information on caring for yourself after your procedure. Your doctor may also give  you more specific instructions. Call your doctor if you have any problems or questions after your procedure. HOME CARE  Do not drive for 24 hours.  Do not sign important papers or use machinery for 24 hours.  You may shower.  You may go back to your usual activities, but go slower for the first 24 hours.  Take rest breaks often during the first 24 hours.  Walk around or use warm packs on your belly (abdomen) if you have belly cramping or gas.  Drink enough fluids to keep your pee (urine) clear or pale yellow.  Resume your normal diet. Avoid heavy or fried foods.  Avoid drinking alcohol for 24 hours or as told by your doctor.  Only take medicines as told by your doctor. If a tissue sample (biopsy) was taken during the procedure:   Do not take aspirin or blood thinners for 7 days, or as told by your doctor.  Do not drink alcohol for 7 days, or as told by your doctor.  Eat soft foods for the first 24 hours. GET HELP IF: You still have a small amount of blood in your poop (stool) 2-3 days after the procedure. GET HELP RIGHT AWAY IF:  You have more than a small amount of blood in your poop.  You see clumps of tissue (blood clots) in your poop.  Your belly is puffy (swollen).  You feel sick to your stomach (nauseous) or throw up (vomit).  You have a fever.  You have belly pain that gets worse and medicine does not help. MAKE SURE YOU:  Understand these instructions.  Will watch your condition.  Will  get help right away if you are not doing well or get worse.   This information is not intended to replace advice given to you by your health care provider. Make sure you discuss any questions you have with your health care provider.   Document Released: 10/25/2010 Document Revised: 09/27/2013 Document Reviewed: 05/30/2013 Elsevier Interactive Patient Education [employer].

## 2015-08-21 NOTE — Anesthesia Postprocedure Evaluation (Signed)
Anesthesia Post Note  Patient: Cathy Gonzalez  Procedure(s) Performed: Procedure(s) (LRB): ESOPHAGOGASTRODUODENOSCOPY (EGD) WITH PROPOFOL (N/A) COLONOSCOPY WITH PROPOFOL (N/A)  Anesthesia type: MAC  Patient location: PACU  Post pain: Pain level controlled  Post assessment: Patient's Cardiovascular Status Stable  Last Vitals:  Filed Vitals:   08/21/15 1520  BP: 149/79  Pulse: 70  Temp:   Resp: 18    Post vital signs: Reviewed and stable  Level of consciousness: sedated  Complications: No apparent anesthesia complications

## 2015-08-21 NOTE — Transfer of Care (Signed)
Immediate Anesthesia Transfer of Care Note  Patient: Cathy Gonzalez  Procedure(s) Performed: Procedure(s): ESOPHAGOGASTRODUODENOSCOPY (EGD) WITH PROPOFOL (N/A) COLONOSCOPY WITH PROPOFOL (N/A)  Patient Location: PACU  Anesthesia Type:MAC  Level of Consciousness: awake, alert  and oriented  Airway & Oxygen Therapy: Patient Spontanous Breathing and Patient connected to face mask oxygen  Post-op Assessment: Report given to RN and Post -op Vital signs reviewed and stable  Post vital signs: Reviewed and stable  Last Vitals:  Filed Vitals:   08/21/15 1321  BP: 171/90  Pulse: 66  Temp: 36.6 C  Resp: 18    Complications: No apparent anesthesia complications

## 2015-08-21 NOTE — Op Note (Signed)
[facility] [place], [address]   COLONOSCOPY PROCEDURE REPORT  PATIENT: Cathy Gonzalez, Cathy Gonzalez  MR#: [id number] BIRTHDATE: 09-10-1946 , 79  yrs. old GENDER: female ENDOSCOPIST: Carol Ada, MD REFERRED BY: PROCEDURE DATE:  August 31, 2015 PROCEDURE:   Colonoscopy with snare polypectomy ASA CLASS:   Class III INDICATIONS:Screening MEDICATIONS: Monitored anesthesia care  DESCRIPTION OF PROCEDURE:   After the risks and benefits and of the procedure were explained, informed consent was obtained.  revealed no abnormalities of the rectum.    The     endoscope was introduced through the anus and advanced to the cecum, which was identified by both the appendix and ileocecal valve .  The quality of the prep was excellent. .  The instrument was then slowly withdrawn as the colon was fully examined. Estimated blood loss is zero unless otherwise noted in this procedure report.    FINDINGS: A 5 mm sessile hepatic flexure polyp was removed with a cold snare.  No evidence of any masses, inflammation, ulcerations, erosions, masses, or vascular abnormalities.            The scope was then withdrawn from the patient and the procedure completed.  WITHDRAWAL TIME: 12 minutes.  COMPLICATIONS: There were no immediate complications. ENDOSCOPIC IMPRESSION: 1) Hepatic flexure polyp. RECOMMENDATIONS: 1) Follow up biopsies. 2) Repeat colonoscopy in 5 years.  REPEAT EXAM:  cc:  _______________________________ eSignedCarol Ada, MD Aug 31, 2015 3:04 PM   CPT CODES: ICD CODES:  The ICD and CPT codes recommended by this software are interpretations from the data that the clinical staff has captured with the software.  The verification of the translation of this report to the ICD and CPT codes and modifiers is the sole responsibility of the health care institution and practicing physician where this report was generated.  [place]. will not be held  responsible for the validity of the ICD and CPT codes included on this report.  AMA assumes no liability for data contained or not contained herein. CPT is a Designer, television/film set of the [employer].

## 2015-08-22 ENCOUNTER — Encounter (HOSPITAL_COMMUNITY): Payer: Self-pay | Admitting: Gastroenterology

## 2015-09-04 ENCOUNTER — Ambulatory Visit (INDEPENDENT_AMBULATORY_CARE_PROVIDER_SITE_OTHER): Payer: Medicare Other

## 2015-09-04 ENCOUNTER — Ambulatory Visit (INDEPENDENT_AMBULATORY_CARE_PROVIDER_SITE_OTHER): Payer: Medicare Other | Admitting: Podiatry

## 2015-09-04 ENCOUNTER — Encounter: Payer: Self-pay | Admitting: Podiatry

## 2015-09-04 VITALS — BP 132/76 | HR 90 | Resp 16

## 2015-09-04 DIAGNOSIS — B351 Tinea unguium: Secondary | ICD-10-CM

## 2015-09-04 DIAGNOSIS — M778 Other enthesopathies, not elsewhere classified: Secondary | ICD-10-CM

## 2015-09-04 DIAGNOSIS — M779 Enthesopathy, unspecified: Secondary | ICD-10-CM

## 2015-09-04 DIAGNOSIS — M79606 Pain in leg, unspecified: Secondary | ICD-10-CM

## 2015-09-04 DIAGNOSIS — L603 Nail dystrophy: Secondary | ICD-10-CM

## 2015-09-04 DIAGNOSIS — E1142 Type 2 diabetes mellitus with diabetic polyneuropathy: Secondary | ICD-10-CM | POA: Diagnosis not present

## 2015-09-04 DIAGNOSIS — Z0189 Encounter for other specified special examinations: Secondary | ICD-10-CM

## 2015-09-04 DIAGNOSIS — M7750 Other enthesopathy of unspecified foot: Secondary | ICD-10-CM | POA: Diagnosis not present

## 2015-09-04 DIAGNOSIS — M2011 Hallux valgus (acquired), right foot: Secondary | ICD-10-CM | POA: Diagnosis not present

## 2015-09-04 DIAGNOSIS — E119 Type 2 diabetes mellitus without complications: Secondary | ICD-10-CM

## 2015-09-04 LAB — HM DIABETES FOOT EXAM

## 2015-09-04 NOTE — Progress Notes (Signed)
   Subjective:    Patient ID: Cathy Gonzalez, female    DOB: 11-15-1945, 69 y.o.   MRN: [id number]  HPI: She presents today as a type II diabetic with a hemoglobin A1c of 6.3%. She states that she is being treated by her primary doctor for history of gout as well as diabetic peripheral neuropathy. She's concerned about pain to her toes as well as pain to the first metatarsophalangeal joint of the right foot which is primarily during times that she is standing while at work at [employer]. She states this is been going on for quite some time and is concerned about spurs. She denies treatment for the pain in her feet other than Lyrica.    Review of Systems  HENT: Positive for sinus pressure.   Respiratory: Positive for shortness of breath.   Cardiovascular: Positive for leg swelling.  Musculoskeletal: Positive for back pain and arthralgias.  Neurological: Positive for seizures.  All other systems reviewed and are negative.      Objective:   Physical Exam: 69 year old female in no acute distress presents today vital signs stable, alert and oriented 3. Pulses are strongly palpable. Neurologic sensorium is decreased for vibratory sensation but intact per Semmes-Weinstein monofilament. Deep tendon reflexes are intact bilateral and muscle strength +5 over 5 dorsiflexion plantar flexors inverters and everters, all edges of musculature is intact. Orthopedic evaluation demonstrate all joints distal to the ankle range of motion without crepitus. She has flexible hammertoe deformities 2 through 5 bilateral and a dorsal exostosis overlying the first metatarsophalangeal joint of the right foot. This is painful on palpation there appears to be a small area of fluctuance beneath the skin possibly a bursitis or ganglion cysts. Radiographs 3 views bilateral foot taken in the office today confirm pes planus bilateral as well as moderate hallux abductovalgus deformity with osteoarthritic changes first metatarsophalangeal  joint bilateral. Cutaneous evaluation of a straight supple well-hydrated cutis toenails hallux bilateral and second digit right foot have been removed in the past otherwise the remaining nails appear to be thick yellow dystrophic onychomycotic and painful on palpation as well as debridement. No open lesions or wounds.        Assessment & Plan:  Assessment: Diabetes mellitus type 2 with diabetic peripheral neuropathy. Pain in limb secondary to onychomycosis. Osteoarthritic change first metatarsophalangeal joint right foot  Plan: We discussed etiology pathology conservative or surgical therapies. Currently I think the dosage of Lyrica is adequate. I'm also suggesting that we inject the first metatarsophalangeal joint with dexamethasone and local anesthetic after sterile Betadine skin prep. She tolerated this procedure well well-seated debrided her nails 1 through 5 bilateral as well as a covered service secondary to pain. I also placed her on a restricted work regimen, consisting of 5-6 hours a day indefinitely. I also recommended that she wear her diabetic shoes which she hasn't home. I will follow up with her in 2 months for another nail debridement and we will consider surgical intervention which would consist of a McBride bunion repair first metatarsophalangeal joint right.  Roselind Messier DPM

## 2015-09-06 ENCOUNTER — Other Ambulatory Visit: Payer: Self-pay | Admitting: Neurology

## 2015-09-13 ENCOUNTER — Encounter: Payer: Self-pay | Admitting: Adult Health

## 2015-09-13 ENCOUNTER — Encounter: Payer: Self-pay | Admitting: *Deleted

## 2015-09-13 ENCOUNTER — Encounter: Payer: Medicare Other | Attending: Family Medicine | Admitting: *Deleted

## 2015-09-13 ENCOUNTER — Ambulatory Visit (INDEPENDENT_AMBULATORY_CARE_PROVIDER_SITE_OTHER): Payer: Medicare Other | Admitting: Adult Health

## 2015-09-13 VITALS — Ht 64.0 in | Wt 258.9 lb

## 2015-09-13 VITALS — BP 127/75 | HR 78 | Resp 20 | Ht 64.0 in | Wt 260.0 lb

## 2015-09-13 DIAGNOSIS — G4733 Obstructive sleep apnea (adult) (pediatric): Secondary | ICD-10-CM

## 2015-09-13 DIAGNOSIS — R569 Unspecified convulsions: Secondary | ICD-10-CM

## 2015-09-13 DIAGNOSIS — G40909 Epilepsy, unspecified, not intractable, without status epilepticus: Secondary | ICD-10-CM

## 2015-09-13 DIAGNOSIS — E119 Type 2 diabetes mellitus without complications: Secondary | ICD-10-CM | POA: Insufficient documentation

## 2015-09-13 MED ORDER — LEVETIRACETAM 500 MG PO TABS: 1000.0000 mg | ORAL_TABLET | Freq: Two times a day (BID) | ORAL | Status: DC

## 2015-09-13 NOTE — Patient Instructions (Signed)
Plan:  Aim for 3 Carb Choices per meal (45 grams) +/- 1 either way  Aim for 0-1 Carbs per snack if hungry  Include protein in moderation with your meals and snacks Consider reading food labels for Total Carbohydrate and Sodium of foods Aim for 600 mg Sodium per meal or less as tolerated Consider taking medication Metformin as directed by MD

## 2015-09-13 NOTE — Progress Notes (Signed)
PATIENT: Cathy Gonzalez DOB: 1945-11-18  REASON FOR VISIT: follow up- nocturnal seizures, morbid obesity, sleep apnea HISTORY FROM: patient  HISTORY OF PRESENT ILLNESS: Cathy Gonzalez is a 69 year old female with a history of nocturnal seizures, morbid obesity and sleep apnea. She returns today for follow-up. At the last visit she was started on Keppra 750 mg twice a day. She was weaned off carbamazepine. She reports that her seizures had improved up until the last 2 weeks. She states that she's had several seizures. Her seizures continue to occur only at night. She typically wakes up and has bitten her tongue. She states that she is usually sluggish throughout the day. The patient states that she is under some increased stress due to the holidays. She states that she is able to complete all ADLs independently she drives a motor vehicle without difficulty. She reports that she recently got her CPAP machine and she will be going this week to have a new mask fitted. The patient also states that she's been treated for hepatitis C she has a follow-up visit in January. She is also seeing a nutritionist to help her lose weight. She returns today for an evaluation.   HISTORY 05/14/15 (WILLIS): Cathy Gonzalez is a 69 year old black female with a history of morbid obesity and sleep apnea. The patient indicates that she has had nocturnal seizures since the late 1970s, she has been on carbamazepine since that time with excellent control. The patient had no seizures until about 18 months ago, she indicates that over the last 6 months, she has had about 10 such seizures. She will bite her tongue at night, and she does not lose control of the bowels or the bladder. She lives alone, she indicates that her seizures have not been witnessed. She operates a Teacher, music without difficulty. She has never had a seizure while awake. She does not recall ever having an EEG study. She last had a carbamazepine level on 02/11/2014, the level was  8.9. She has recently been diagnosed with sleep apnea, but she is not on CPAP as she does not have a mask that fits her at this time. She is trying to get this piece of durable medical equipment. She reports no focal numbness or weakness of the face, arms, or legs. She does have a history of a peripheral neuropathy, and she is on Lyrica for this taking 100 mg 3 times daily. She recently was diagnosed with hepatitis C, and she may be going on a medication for this. Her doctor indicated that they would like her to come off of the carbamazepine because of this. She is sent to this office for further evaluation.  REVIEW OF SYSTEMS: Out of a complete 14 system review of symptoms, the patient complains only of the following symptoms, and all other reviewed systems are negative.  Joint pain, back pain, apnea, snoring  ALLERGIES: No Known Allergies  HOME MEDICATIONS: Outpatient Prescriptions Prior to Visit  Medication Sig Dispense Refill  . amLODipine (NORVASC) 10 MG tablet Take 10 mg by mouth daily.    [name] aspirin EC 81 MG tablet Take 1 tablet (81 mg total) by mouth daily. 30 tablet 3  . chlorthalidone (HYGROTON) 25 MG tablet Take 25 mg by mouth daily.    . colchicine 0.6 MG tablet Take 0.6 mg by mouth daily.     . diclofenac sodium (VOLTAREN) 1 % GEL Apply 1 application topically 2 (two) times daily. Apply 2 inches of medication on affected joint and  rub in well.  0  . Ledipasvir-Sofosbuvir (HARVONI) 90-400 MG TABS Take 1 tablet by mouth daily. 30 tablet 2  . levETIRAcetam (KEPPRA) 500 MG tablet TAKE 1 TABLET TWICE A DAY FOR 1 WEEK, THEN TAKE 1 AND 1/2 TABLETS TWICE A DAY 90 tablet 0  . metFORMIN (GLUCOPHAGE) 500 MG tablet Take 500 mg by mouth at bedtime.     . montelukast (SINGULAIR) 10 MG tablet Take 10 mg by mouth at bedtime.    . pregabalin (LYRICA) 150 MG capsule Take 150-300 mg by mouth 2 (two) times daily. Takes 150mg  in the AM and 300mg  in the PM.    . sucralfate (CARAFATE) 1 G tablet Take 1 g  by mouth 4 (four) times daily -  with meals and at bedtime.    [name] tiZANidine (ZANAFLEX) 4 MG tablet Take 4 mg by mouth at bedtime.     . valsartan (DIOVAN) 320 MG tablet Take 320 mg by mouth daily.     No facility-administered medications prior to visit.    PAST MEDICAL HISTORY: Past Medical History  Diagnosis Date  . Hypertension   . Diabetes mellitus   . Arthritis   . Hepatitis C   . Hypokalemia   . Nocturnal seizures ([place]) 05/14/2015  . Morbidly obese ([place])   . OSA on CPAP   . Diabetic neuropathy, type II diabetes mellitus ([place])     PAST SURGICAL HISTORY: Past Surgical History  Procedure Laterality Date  . Abdominal hysterectomy    . Cataract extraction Left   . Esophagogastroduodenoscopy (egd) with propofol N/A 08/21/2015    Procedure: ESOPHAGOGASTRODUODENOSCOPY (EGD) WITH PROPOFOL;  Surgeon: Carol Ada, MD;  Location: [facility];  Service: Endoscopy;  Laterality: N/A;  . Colonoscopy with propofol N/A 08/21/2015    Procedure: COLONOSCOPY WITH PROPOFOL;  Surgeon: Carol Ada, MD;  Location: [facility];  Service: Endoscopy;  Laterality: N/A;    FAMILY HISTORY: Family History  Problem Relation Age of Onset  . Diabetes Sister   . Seizures Neg Hx   . Diabetes Brother   . Cancer Sister     breast    SOCIAL HISTORY: Social History   Social History  . Marital Status: Single    Spouse Name: N/A  . Number of Children: 4  . Years of Education: 12   Occupational History  . [name]    Social History Main Topics  . Smoking status: Never Smoker   . Smokeless tobacco: Never Used  . Alcohol Use: No  . Drug Use: No  . Sexual Activity: No   Other Topics Concern  . Not on file   Social History Narrative   Patient occasionally drinks caffeine.   Patient is right handed.      PHYSICAL EXAM  Filed Vitals:   09/13/15 0736  BP: 127/75  Pulse: 78  Resp: 20  Height: 5\' 4"  (1.626 m)  Weight: 260 lb (117.935 kg)   Body mass index is 44.61  kg/(m^2).  Generalized: Well developed, in no acute distress, obesity   Neurological examination  Mentation: Alert oriented to time, place, history taking. Follows all commands speech and language fluent Cranial nerve II-XII: Pupils were equal round reactive to light. Extraocular movements were full, visual field were full on confrontational test. Facial sensation and strength were normal. Uvula tongue midline. Head turning and shoulder shrug  were normal and symmetric. Motor: The motor testing reveals 5 over 5 strength of all 4 extremities. Good symmetric motor tone is noted throughout.  Sensory:  Sensory testing is intact to soft touch on all 4 extremities. No evidence of extinction is noted.  Coordination: Cerebellar testing reveals good finger-nose-finger and heel-to-shin bilaterally.  Gait and station: Gait is normal. Tandem gait is slightly unsteady. Romberg is negative. No drift is seen.  Reflexes: Deep tendon reflexes are symmetric and normal bilaterally.   DIAGNOSTIC DATA (LABS, IMAGING, TESTING) - I reviewed patient records, labs, notes, testing and imaging myself where available.  Lab Results  Component Value Date   WBC 3.8* 07/12/2015   HGB 11.9* 07/12/2015   HCT 35.0* 07/12/2015   MCV 86.6 07/12/2015   PLT 237 07/12/2015      Component Value Date/Time   NA 140 07/12/2015 1026   K 4.0 07/12/2015 1026   CL 103 07/12/2015 1026   CO2 30 07/12/2015 1026   GLUCOSE 118* 07/12/2015 1026   BUN 27* 07/12/2015 1026   CREATININE 0.90 07/12/2015 1026   CREATININE 0.99 06/21/2014 1824   CALCIUM 9.7 07/12/2015 1026   PROT 7.3 07/12/2015 1026   ALBUMIN 3.8 07/12/2015 1026   AST 63* 07/12/2015 1026   ALT 56* 07/12/2015 1026   ALKPHOS 78 07/12/2015 1026   BILITOT 0.3 07/12/2015 1026   GFRNONAA 58* 06/21/2014 1824   GFRAA 67* 06/21/2014 1824   Lab Results  Component Value Date   CHOL 216* 02/11/2014   HDL 65 02/11/2014   LDLCALC 133* 02/11/2014   TRIG 92 02/11/2014    CHOLHDL 3.3 02/11/2014   Lab Results  Component Value Date   HGBA1C 7.2* 02/11/2014   ASSESSMENT AND PLAN 69 y.o. year old female  has a past medical history of Hypertension; Diabetes mellitus; Arthritis; Hepatitis C; Hypokalemia; Nocturnal seizures ([place]) (05/14/2015); Morbidly obese ([place]); OSA on CPAP; and Diabetic neuropathy, type II diabetes mellitus ([place]). here with:  1. Nocturnal Seizures 2. Obesity  3. Obstructed sleep apnea  The patient's seizure frequency has increased in the last 2 weeks. I will increase her Keppra to 1000 mg twice a day. Patient encouraged to have her CPAP mask refitted as untreated sleep apnea could be contributing to her seizures. Patient advised that if she continues to have frequent seizures after increasing Keppra she should let us know. She will follow-up in 2-3 months or sooner if needed.    Ward Givens, MSN, NP-C 09/13/2015, 7:32 AM [facility] [address] [place], [place] [address] [phone]

## 2015-09-13 NOTE — Patient Instructions (Signed)
Increase Keppra to 1000 mg (2 tablets) twice a day. If your symptoms worsen or you develop new symptoms please let us know.

## 2015-09-13 NOTE — Progress Notes (Signed)
Diabetes Self-Management Education  Visit Type: First/Initial  Appt. Start Time: 0900 Appt. End Time: 1030  09/13/2015  Cathy Gonzalez, identified by name and date of birth, is a 69 y.o. female with a diagnosis of Diabetes: Type 2.   ASSESSMENT  Height 5\' 4"  (1.626 m), weight 258 lb 14.4 oz (117.436 kg). Body mass index is 44.42 kg/(m^2).      Diabetes Self-Management Education - 09/13/15 0857    Visit Information   Visit Type First/Initial   Initial Visit   Diabetes Type Type 2   Are you currently following a meal plan? No   Are you taking your medications as prescribed? Yes   Date Diagnosed 2014   Health Coping   How would you rate your overall health? Good   Psychosocial Assessment   Patient Belief/Attitude about Diabetes Afraid   Self-care barriers None   Other persons present Patient   Patient Concerns Weight Control;Glycemic Control   Preferred Learning Style No preference indicated   Learning Readiness Ready   How often do you need to have someone help you when you read instructions, pamphlets, or other written materials from your doctor or pharmacy? 1 - Never   What is the last grade level you completed in school? 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 0 times/day (not testing)   Have you had a dilated eye exam in the past 12 months? Yes   Have you had a dental exam in the past 12 months? No   Are you checking your feet? No   Dietary Intake   Breakfast Special K with 1% milk, banana OR skips 1-2 days a week, not hungry   Snack (morning) occasionally cashews, usually nothing   Lunch bologna or other sandwich, yogurt with nuts on the side   Snack (afternoon) fresh fruit occasionally   Dinner sandwich, fresh fruit. Doesn't cook for herself, occasionally uses microwave to heat up food,    Snack (evening) mixed nuts   Beverage(s) water,    Exercise   Exercise Type ADL's    How many days per week to you exercise? 0   How many minutes per day do you exercise? 0   Total minutes per week of exercise 0   Patient Education   Previous Diabetes Education No   Disease state  Definition of diabetes, type 1 and 2, and the diagnosis of diabetes   Nutrition management  Role of diet in the treatment of diabetes and the relationship between the three main macronutrients and blood glucose level;Food label reading, portion sizes and measuring food.;Carbohydrate counting;Other (comment)  Sodium content of foods and weight loss informaion    Physical activity and exercise  --  jplan to discuss at next visit   Chronic complications Relationship between chronic complications and blood glucose control   Psychosocial adjustment Identified and addressed patients feelings and concerns about diabetes   Individualized Goals (developed by patient)   Nutrition Follow meal plan discussed   Medications take my medication as prescribed   Monitoring  Not Applicable   Outcomes   Expected Outcomes Demonstrated interest in learning. Expect positive outcomes   Future DMSE 4-6 wks   Program Status Not Completed      Individualized Plan for Diabetes Self-Management Training:   Learning Objective:  Patient will have a greater understanding of diabetes self-management. Patient education plan is to attend individual and/or  group sessions per assessed needs and concerns.   Plan:   Patient Instructions  Plan:  Aim for 3 Carb Choices per meal (45 grams) +/- 1 either way  Aim for 0-1 Carbs per snack if hungry  Include protein in moderation with your meals and snacks Consider reading food labels for Total Carbohydrate and Sodium of foods Aim for 600 mg Sodium per meal or less as tolerated Consider taking medication Metformin as directed by MD      Expected Outcomes:  Demonstrated interest in learning. Expect positive outcomes  Education material provided: Living Well with Diabetes,  A1C conversion sheet, Meal plan card, Carbohydrate counting sheet and No sodium seasonings  If problems or questions, patient to contact team via:  Phone and Email  Future DSME appointment: 4-6 wks

## 2015-09-13 NOTE — Progress Notes (Signed)
I have read the note, and I agree with the clinical assessment and plan.  Damyen Knoll KEITH   

## 2015-10-03 ENCOUNTER — Other Ambulatory Visit: Payer: Self-pay | Admitting: Neurology

## 2015-10-11 ENCOUNTER — Ambulatory Visit (INDEPENDENT_AMBULATORY_CARE_PROVIDER_SITE_OTHER): Payer: Medicare Other | Admitting: Internal Medicine

## 2015-10-11 ENCOUNTER — Encounter: Payer: Self-pay | Admitting: Internal Medicine

## 2015-10-11 VITALS — BP 150/90 | HR 77 | Temp 97.6°F | Ht 64.0 in | Wt 260.0 lb

## 2015-10-11 DIAGNOSIS — Z23 Encounter for immunization: Secondary | ICD-10-CM

## 2015-10-11 DIAGNOSIS — G40909 Epilepsy, unspecified, not intractable, without status epilepticus: Secondary | ICD-10-CM

## 2015-10-11 DIAGNOSIS — K7460 Unspecified cirrhosis of liver: Secondary | ICD-10-CM

## 2015-10-11 DIAGNOSIS — K7400 Hepatic fibrosis, unspecified: Secondary | ICD-10-CM

## 2015-10-11 DIAGNOSIS — B182 Chronic viral hepatitis C: Secondary | ICD-10-CM | POA: Diagnosis not present

## 2015-10-11 DIAGNOSIS — E669 Obesity, unspecified: Secondary | ICD-10-CM

## 2015-10-11 DIAGNOSIS — R569 Unspecified convulsions: Secondary | ICD-10-CM

## 2015-10-11 NOTE — Assessment & Plan Note (Signed)
Will do [place] screening with ultrasound now and every 6 months.  RTC after next ultrasound

## 2015-10-11 NOTE — Assessment & Plan Note (Signed)
Has seen neurology and seems to be doing well.

## 2015-10-11 NOTE — Assessment & Plan Note (Addendum)
Trying to lose weight and increasing exercise. Will be beneficial for liver health

## 2015-10-11 NOTE — Progress Notes (Signed)
   Subjective:    Patient ID: Cathy Gonzalez, female    DOB: June 03, 1946, 70 y.o.   MRN: [id number]  HPI She comes in for follow up of her hepatitis C.   She has genotype 1a, elastography with F3/4 and ultrasound with nodular hepatic contour concerning for cirrhosis.  Unknown duration.  Does not drink alcohol.   Sees Dr. Benson Norway of GI and had  Colonoscopy and EGD, no varices noted per patient.  Finished [facility] 3 months ago.   Changed off of carbamazepine to Keppra and no recent night sz activity and has seen neurology.     Review of Systems  Constitutional: Negative for fatigue.  Gastrointestinal: Negative for nausea and diarrhea.  Skin: Negative for rash.  Neurological: Negative for dizziness and light-headedness.       Objective:   Physical Exam  Constitutional: She appears well-developed and well-nourished. No distress.  Eyes: No scleral icterus.  Cardiovascular: Normal rate, regular rhythm and normal heart sounds.   No murmur heard. Pulmonary/Chest: Effort normal and breath sounds normal. No respiratory distress.  Skin: No rash noted.   Social History   Social History  . Marital Status: Single    Spouse Name: N/A  . Number of Children: 4  . Years of Education: 12   Occupational History  . [name]    Social History Main Topics  . Smoking status: Never Smoker   . Smokeless tobacco: Never Used  . Alcohol Use: No  . Drug Use: No  . Sexual Activity: No   Other Topics Concern  . Not on file   Social History Narrative   Patient occasionally drinks caffeine.   Patient is right handed.         Assessment & Plan:

## 2015-10-11 NOTE — Assessment & Plan Note (Signed)
SVR12 today and will let her know of results.

## 2015-10-16 ENCOUNTER — Telehealth: Payer: Self-pay | Admitting: *Deleted

## 2015-10-16 LAB — HEPATITIS C RNA QUANTITATIVE: HCV Quantitative: NOT DETECTED IU/mL (ref <15)

## 2015-10-16 NOTE — Telephone Encounter (Signed)
Patient notified Cathy Gonzalez  

## 2015-10-16 NOTE — Telephone Encounter (Signed)
-----   Message from Thayer Headings, MD sent at 10/16/2015 10:04 AM EST ----- Please let her know her final HCV viral load is undetectable and she is now considered cured!

## 2015-10-18 ENCOUNTER — Ambulatory Visit
Admission: RE | Admit: 2015-10-18 | Discharge: 2015-10-18 | Disposition: A | Discharge status: Home / Self Care | Payer: Medicare Other | Source: Ambulatory Visit | Attending: Internal Medicine | Admitting: Internal Medicine

## 2015-10-18 DIAGNOSIS — K7400 Hepatic fibrosis, unspecified: Secondary | ICD-10-CM

## 2015-11-06 ENCOUNTER — Encounter: Payer: Self-pay | Admitting: Podiatry

## 2015-11-06 ENCOUNTER — Ambulatory Visit (INDEPENDENT_AMBULATORY_CARE_PROVIDER_SITE_OTHER): Payer: Medicare Other | Admitting: Podiatry

## 2015-11-06 VITALS — BP 136/56 | HR 79 | Resp 16

## 2015-11-06 DIAGNOSIS — R600 Localized edema: Secondary | ICD-10-CM | POA: Diagnosis not present

## 2015-11-06 DIAGNOSIS — E1142 Type 2 diabetes mellitus with diabetic polyneuropathy: Secondary | ICD-10-CM

## 2015-11-06 DIAGNOSIS — M778 Other enthesopathies, not elsewhere classified: Secondary | ICD-10-CM

## 2015-11-06 DIAGNOSIS — M7750 Other enthesopathy of unspecified foot: Secondary | ICD-10-CM

## 2015-11-06 DIAGNOSIS — M779 Enthesopathy, unspecified: Secondary | ICD-10-CM

## 2015-11-07 ENCOUNTER — Telehealth: Payer: Self-pay | Admitting: *Deleted

## 2015-11-07 ENCOUNTER — Encounter: Payer: Medicare Other | Attending: Family Medicine | Admitting: *Deleted

## 2015-11-07 ENCOUNTER — Encounter: Payer: Self-pay | Admitting: *Deleted

## 2015-11-07 VITALS — Ht 64.0 in | Wt 259.0 lb

## 2015-11-07 DIAGNOSIS — E119 Type 2 diabetes mellitus without complications: Secondary | ICD-10-CM | POA: Diagnosis present

## 2015-11-07 NOTE — Progress Notes (Signed)
Diabetes Self-Management Education  Visit Type:  Follow-up  Appt. Start Time: 0900 Appt. End Time: 0930  11/07/2015  [name], identified by name and date of birth, is a 70 y.o. female with a diagnosis of Diabetes: Type 2.   ASSESSMENT  Height 5\' 4"  (1.626 m), weight 259 lb (117.482 kg). Body mass index is 44.44 kg/(m^2).       Diabetes Self-Management Education - 11/07/15 0911    Health Coping   How would you rate your overall health? Good   Psychosocial Assessment   Patient Belief/Attitude about Diabetes Motivated to manage diabetes   Self-care barriers None   Self-management support Doctor's office;Family   Patient Concerns Glycemic Control;Weight Control   Learning Readiness Change in progress   Complications   Last HgB A1C per patient/outside source 6.7 %  States improved to this level when checked at MD visit 2 weeks ago   How often do you check your blood sugar? 0 times/day (not testing)   Exercise   Exercise Type ADL's   How many days per week to you exercise? 0   How many minutes per day do you exercise? 0   Total minutes per week of exercise 0   Patient Education   Previous Diabetes Education Yes (please comment)  started here 1 month ago   Nutrition management  Carbohydrate counting   Physical activity and exercise  Helped patient identify appropriate exercises in relation to his/her diabetes, diabetes complications and other health issue.   Monitoring Identified appropriate SMBG and/or A1C goals.  Offered information on obtaining meter at [facility] (developed by patient)   Nutrition Follow meal plan discussed   Physical Activity Exercise 5-7 days per week  start with 5 minutes 3 times a day and increase as tolerated   Monitoring  test my blood glucose as discussed   Patient Self-Evaluation of Goals - Patient rates self as meeting previously set goals (% of time)   Nutrition >75%   Physical Activity < 25%   Medications >75%   Monitoring Not Applicable   Problem Solving 50 - 75 %   Reducing Risk >75%   Health Coping >75%   Outcomes   Program Status Not Completed   Subsequent Visit   Since your last visit have you continued or begun to take your medications as prescribed? Yes   Since your last visit have you experienced any weight changes? No change   Since your last visit, are you checking your blood glucose at least once a day? No  but she would like access to a meter so she could check on occasion      Learning Objective:  Patient will have a greater understanding of diabetes self-management. Patient education plan is to attend individual and/or group sessions per assessed needs and concerns.   Plan:   Patient Instructions  Plan:  Continue to Aim for 3 Carb Choices per meal (45 grams) +/- 1 either way  Continue to aim for 0-1 Carbs per snack if hungry  Include protein in moderation with your meals and snacks Continue reading food labels for Total Carbohydrate and Sodium of foods Aim for 600 mg Sodium per meal or less as tolerated Continue taking medication Metformin as directed by MD We talked today about increasing your activity level by riding your stationary bike or doing Arm Chair Exercises Consider 5 minutes of any of the above activities 3 times every day (after each meal) for a week or two, and  increase to 10 minutes as tolerated IF you choose to check your BG occasionally, consider alternating before a meal and 2 hours after a meal maybe once a week or as you feel you need to.      Expected Outcomes:  Demonstrated interest in learning. Expect positive outcomes  Education material provided:  [employer] Meter information sheet, Arm Chair Exercise handout  If problems or questions, patient to contact team via:  Phone and Email  Future DSME appointment: - 2 months

## 2015-11-07 NOTE — Telephone Encounter (Signed)
[facility] requested completed paper work for her job restrictions.

## 2015-11-07 NOTE — Patient Instructions (Addendum)
Plan:  Continue to Aim for 3 Carb Choices per meal (45 grams) +/- 1 either way  Continue to aim for 0-1 Carbs per snack if hungry  Include protein in moderation with your meals and snacks Continue reading food labels for Total Carbohydrate and Sodium of foods Aim for 600 mg Sodium per meal or less as tolerated Continue taking medication Metformin as directed by MD We talked today about increasing your activity level by riding your stationary bike or doing Arm Chair Exercises Consider 5 minutes of any of the above activities 3 times every day (after each meal) for a week or two, and increase to 10 minutes as tolerated IF you choose to check your BG occasionally, consider alternating before a meal and 2 hours after a meal maybe once a week or as you feel you need to.

## 2015-11-07 NOTE — Progress Notes (Signed)
She presents today after having recently been placed on a new cholesterol medication. She is now complaining of bilateral edema to the lower extremities. She still complaining of diabetic peripheral neuropathy which is minimally controlled by her Lyrica. At this point she's been on Lyrica for a while so I doubt swelling is from that. She does state that her blood sugars have been somewhat out of control which may contribute to the swelling as well. She still has pain across the top of the feet.  Objective: Vital signs are stable alert and oriented 3. Pulses are strongly palpable. She has pain on transverse range of motion of Lisfranc's joints with palpable nodularity nonpulsatile in nature. This is consistent with osteoarthritic changes also visible radiograph Lisfranc's joints bilateral foot.  Assessment: Diabetes mellitus with diabetic peripheral neuropathy edema to the bilateral lower extremity and capsulitis with osteoarthritic change mid foot bilateral.  Plan: We are requesting that she have stool to lean on while working. I also recommended injections across the dorsal aspect of the foot. We injected her today dorsal aspect of foot with Kenalog and local anesthetic bilaterally. She will speak to her primary care provider about swelling in her lower extremities that she feels is associated with her medications.

## 2015-11-22 ENCOUNTER — Ambulatory Visit: Payer: Medicare Other | Admitting: Adult Health

## 2015-12-03 ENCOUNTER — Emergency Department (HOSPITAL_COMMUNITY): Payer: Medicare Other

## 2015-12-03 ENCOUNTER — Emergency Department (HOSPITAL_COMMUNITY)
Admission: EM | Admit: 2015-12-03 | Discharge: 2015-12-03 | Disposition: A | Discharge status: Home / Self Care | Payer: Medicare Other | Attending: Emergency Medicine | Admitting: Emergency Medicine

## 2015-12-03 ENCOUNTER — Encounter (HOSPITAL_COMMUNITY): Payer: Self-pay

## 2015-12-03 DIAGNOSIS — M25561 Pain in right knee: Secondary | ICD-10-CM | POA: Diagnosis present

## 2015-12-03 DIAGNOSIS — Z8619 Personal history of other infectious and parasitic diseases: Secondary | ICD-10-CM | POA: Insufficient documentation

## 2015-12-03 DIAGNOSIS — M1990 Unspecified osteoarthritis, unspecified site: Secondary | ICD-10-CM | POA: Diagnosis not present

## 2015-12-03 DIAGNOSIS — E119 Type 2 diabetes mellitus without complications: Secondary | ICD-10-CM | POA: Diagnosis not present

## 2015-12-03 DIAGNOSIS — G4733 Obstructive sleep apnea (adult) (pediatric): Secondary | ICD-10-CM | POA: Insufficient documentation

## 2015-12-03 DIAGNOSIS — Z79899 Other long term (current) drug therapy: Secondary | ICD-10-CM | POA: Insufficient documentation

## 2015-12-03 DIAGNOSIS — Z7984 Long term (current) use of oral hypoglycemic drugs: Secondary | ICD-10-CM | POA: Diagnosis not present

## 2015-12-03 DIAGNOSIS — Z7982 Long term (current) use of aspirin: Secondary | ICD-10-CM | POA: Insufficient documentation

## 2015-12-03 DIAGNOSIS — I10 Essential (primary) hypertension: Secondary | ICD-10-CM | POA: Insufficient documentation

## 2015-12-03 LAB — CBG MONITORING, ED: Glucose-Capillary: 100 mg/dL — ABNORMAL HIGH (ref 65–99)

## 2015-12-03 MED ORDER — HYDROCODONE-ACETAMINOPHEN 5-325 MG PO TABS: 1.0000 | ORAL_TABLET | ORAL | Status: DC | PRN

## 2015-12-03 MED ORDER — OXYCODONE-ACETAMINOPHEN 5-325 MG PO TABS
1.0000 | ORAL_TABLET | Freq: Once | ORAL | Status: AC
  Administered 2015-12-03: 1 via ORAL
  Filled 2015-12-03: qty 1

## 2015-12-03 MED ORDER — HYDROCODONE-ACETAMINOPHEN 5-325 MG PO TABS: 2.0000 | ORAL_TABLET | ORAL | Status: DC | PRN

## 2015-12-03 MED ORDER — DEXAMETHASONE SODIUM PHOSPHATE 10 MG/ML IJ SOLN
10.0000 mg | Freq: Once | INTRAMUSCULAR | Status: AC
  Administered 2015-12-03: 10 mg via INTRAMUSCULAR
  Filled 2015-12-03: qty 1

## 2015-12-03 NOTE — ED Notes (Signed)
CBG 100, MD notified.

## 2015-12-03 NOTE — ED Notes (Signed)
Pt states she just started having sharp right knee pains suddenly on yesterday; Pt denies injury to knee but states she walked up steep hill; Pt c/o of pain 1-2 while at rest but severe pain 10/10 upon standing or walking; pt a&ox 4 on arrival;

## 2015-12-03 NOTE — Discharge Instructions (Signed)

## 2015-12-03 NOTE — ED Notes (Signed)
Patient transported to X-ray 

## 2015-12-03 NOTE — ED Provider Notes (Signed)
CSN: [id number]     Arrival date & time 12/03/15  [id number] History   First MD Initiated Contact with Patient 12/03/15 [phone]     Chief Complaint  Patient presents with  . Knee Pain     (Consider location/radiation/quality/duration/timing/severity/associated sxs/prior Treatment) HPI Comments: Patient presents with complaints of right knee pain. Patient reports pain began yesterday. She did not injure herself in any way, although pain began after she walked up a steep hill. Patient complains of sharp pains in her right knee that worsens when she tries to walk. She does have a history arthritis in multiple joints.  Patient is a 70 y.o. female presenting with knee pain.  Knee Pain   Past Medical History  Diagnosis Date  . Hypertension   . Diabetes mellitus   . Arthritis   . Hepatitis C   . Hypokalemia   . Nocturnal seizures ([place]) 05/14/2015  . Morbidly obese ([place])   . OSA on CPAP   . Diabetic neuropathy, type II diabetes mellitus [facility])    Past Surgical History  Procedure Laterality Date  . Abdominal hysterectomy    . Cataract extraction Left   . Esophagogastroduodenoscopy (egd) with propofol N/A 08/21/2015    Procedure: ESOPHAGOGASTRODUODENOSCOPY (EGD) WITH PROPOFOL;  Surgeon: Carol Ada, MD;  [facility];  Service: Endoscopy;  Laterality: N/A;  . Colonoscopy with propofol N/A 08/21/2015    Procedure: COLONOSCOPY WITH PROPOFOL;  Surgeon: Carol Ada, MD;  Location[facility];  Service: Endoscopy;  Laterality: N/A;   Family History  Problem Relation Age of Onset  . Diabetes Sister   . Seizures Neg Hx   . Diabetes Brother   . Cancer Sister     breast   Social History  Substance Use Topics  . Smoking status: Never Smoker   . Smokeless tobacco: Never Used  . Alcohol Use: No   OB History    No data available     Review of Systems  Musculoskeletal: Positive for arthralgias.  All other systems reviewed and are negative.     Allergies  Review of patient's  allergies indicates no known allergies.  Home Medications   Prior to Admission medications   Medication Sig Start Date End Date Taking? Authorizing Provider  amLODipine (NORVASC) 10 MG tablet Take 10 mg by mouth daily.   Yes Historical Provider, MD  aspirin EC 81 MG tablet Take 1 tablet (81 mg total) by mouth daily. 02/12/14  Yes Ripudeep Krystal Eaton, MD  chlorthalidone (HYGROTON) 25 MG tablet Take 25 mg by mouth daily.   Yes Historical Provider, MD  colchicine 0.6 MG tablet Take 0.6 mg by mouth daily.    Yes Historical Provider, MD  diclofenac sodium (VOLTAREN) 1 % GEL Apply 1 application topically 2 (two) times daily. Apply 2 inches of medication on affected joint and rub in well. 07/23/15  Yes Historical Provider, MD  levETIRAcetam (KEPPRA) 500 MG tablet Take 2 tablets (1,000 mg total) by mouth 2 (two) times daily. 09/13/15  Yes Ward Givens, NP  metFORMIN (GLUCOPHAGE) 500 MG tablet Take 500 mg by mouth at bedtime.    Yes Historical Provider, MD  montelukast (SINGULAIR) 10 MG tablet Take 10 mg by mouth at bedtime.   Yes Historical Provider, MD  pregabalin (LYRICA) 150 MG capsule Take 150-300 mg by mouth 2 (two) times daily. Takes 150mg  in the AM and 300mg  in the PM.   Yes Historical Provider, MD  sucralfate (CARAFATE) 1 G tablet Take 1 g by mouth 4 (four) times  daily -  with meals and at bedtime.   Yes Historical Provider, MD  tiZANidine (ZANAFLEX) 4 MG tablet Take 4 mg by mouth at bedtime.    Yes Historical Provider, MD  valsartan (DIOVAN) 320 MG tablet Take 320 mg by mouth daily.   Yes Historical Provider, MD   BP 122/64 mmHg  Pulse 69  Temp(Src) 98 F (36.7 C) (Oral)  Resp 18  Ht 5\' 4"  (1.626 m)  Wt 260 lb (117.935 kg)  BMI 44.61 kg/m2  SpO2 97% Physical Exam  Constitutional: She is oriented to person, place, and time. She appears well-developed and well-nourished. No distress.  HENT:  Head: Normocephalic and atraumatic.  Right Ear: Hearing normal.  Left Ear: Hearing normal.  Nose:  Nose normal.  Mouth/Throat: Oropharynx is clear and moist and mucous membranes are normal.  Eyes: Conjunctivae and EOM are normal. Pupils are equal, round, and reactive to light.  Neck: Normal range of motion. Neck supple.  Cardiovascular: Regular rhythm, S1 normal and S2 normal.  Exam reveals no gallop and no friction rub.   No murmur heard. Pulmonary/Chest: Effort normal and breath sounds normal. No respiratory distress. She exhibits no tenderness.  Abdominal: Soft. Normal appearance and bowel sounds are normal. There is no hepatosplenomegaly. There is no tenderness. There is no rebound, no guarding, no tenderness at McBurney's point and negative Murphy's sign. No hernia.  Musculoskeletal: Normal range of motion.       Right knee: She exhibits no swelling, no effusion, no ecchymosis, no deformity, no laceration, no erythema and normal alignment. Tenderness found.  Neurological: She is alert and oriented to person, place, and time. She has normal strength. No cranial nerve deficit or sensory deficit. Coordination normal. GCS eye subscore is 4. GCS verbal subscore is 5. GCS motor subscore is 6.  Skin: Skin is warm, dry and intact. No rash noted. No cyanosis.  Psychiatric: She has a normal mood and affect. Her speech is normal and behavior is normal. Thought content normal.  Nursing note and vitals reviewed.   ED Course  Procedures (including critical care time) Labs Review Labs Reviewed - No data to display  Imaging Review No results found. I have personally reviewed and evaluated these images and lab results as part of my medical decision-making.   EKG Interpretation None      MDM   Final diagnoses:  None   arthralgia  Patient presents to the ER for evaluation of knee pain. Patient reports pain began yesterday after walking up a hill. She does not have any significant swelling. There is no redness, erythema, warmth on examination. Therefore no concern for septic bursitis or  septic arthritis. X-ray performed to rule out abnormality. Patient does have a history of osteoarthritis as well as gout. There does not appear to be any significant effusion on examination to aspirate the knee. Will treat empirically for arthritis pain.    Orpah Greek, MD 12/03/15 [phone]

## 2015-12-18 ENCOUNTER — Ambulatory Visit (INDEPENDENT_AMBULATORY_CARE_PROVIDER_SITE_OTHER): Payer: Medicare Other | Admitting: Podiatry

## 2015-12-18 ENCOUNTER — Encounter: Payer: Self-pay | Admitting: Podiatry

## 2015-12-18 ENCOUNTER — Other Ambulatory Visit: Payer: Self-pay

## 2015-12-18 VITALS — BP 157/79 | HR 96 | Resp 12

## 2015-12-18 DIAGNOSIS — M722 Plantar fascial fibromatosis: Secondary | ICD-10-CM | POA: Diagnosis not present

## 2015-12-18 DIAGNOSIS — Z1231 Encounter for screening mammogram for malignant neoplasm of breast: Secondary | ICD-10-CM

## 2015-12-18 DIAGNOSIS — E1142 Type 2 diabetes mellitus with diabetic polyneuropathy: Secondary | ICD-10-CM | POA: Diagnosis not present

## 2015-12-18 NOTE — Progress Notes (Signed)
She presents today with a chief complaint of pain and burning to her medial longitudinal arch. She states that she still is burning to the dorsal aspect of the foot as well. She states that the Voltaren gel really did not seem to help much at all. She would like to see about getting a pair of diabetic shoes.  Objective: Vital signs are stable she is alert and oriented 3. I have reviewed her past medical history medications allergies and surgical history. I found out that she is not taking her Lyrica as prescribed twice a day to capsules at a time. She is taking one in the morning and 2 at night. She is pain on palpation medial calcaneal tubercle of the right heel more so than the left.  Assessment: Diabetic peripheral neuropathy with plantar fasciitis bilateral.  Plan: I encouraged her to start taking 300 mg of her Lyrica twice daily as prescribed. I also reinjected her bilateral heels today with Kenalog and local anesthetic. I encouraged her to continue to apply the full tear and gel to her bilateral foot I will follow-up with her in 1 month but we also requested diabetic shoe formed to be completed by her primary care doctor.

## 2015-12-19 ENCOUNTER — Ambulatory Visit: Payer: Medicare Other | Admitting: *Deleted

## 2015-12-26 ENCOUNTER — Ambulatory Visit: Payer: Medicare Other | Admitting: Adult Health

## 2015-12-26 ENCOUNTER — Telehealth: Payer: Self-pay

## 2015-12-26 NOTE — Telephone Encounter (Signed)
Patient called and rescheduled day of appt.

## 2015-12-27 ENCOUNTER — Encounter: Payer: Self-pay | Admitting: Adult Health

## 2015-12-28 ENCOUNTER — Ambulatory Visit: Payer: Medicare Other | Admitting: *Deleted

## 2016-01-07 ENCOUNTER — Telehealth: Payer: Self-pay | Admitting: Adult Health

## 2016-01-07 NOTE — Telephone Encounter (Signed)
Pt returned my call. Pt states that she has a dentist appt tomorrow at 3pm. I advised her that it is important for Dr. Jannifer Franklin and Jinny Blossom, NP to see her in the office to monitor her seizure activity and the mediations they are prescribing for her. Pt states that she doesn't understand why she has to keep coming in if all the provider is going to do is ask her questions and "not do anything for her." I advised her that we can cancel the appt tomorrow with Jinny Blossom, NP and make an appt with Dr. Jannifer Franklin. Pt is agreeable to coming in on April 10th at 8:00 because she is off of work that day.

## 2016-01-07 NOTE — Telephone Encounter (Signed)
Patient was very upset that she got a letter from [place] because she cancelled her appt the same day of her appt. She said that we sent a nasty letter to her and that she did not appreciate it. She also said she doesn't know why she is seeing Jinny Blossom and not the doctor because she felt like Megan doesn't do anything for her. She got upset saying she spends a $40.00 copay for what she says for a 15 min visit. She said she wanted to talk to her doctor. The number to contact the patient is [phone]

## 2016-01-07 NOTE — Telephone Encounter (Signed)
I did increase her Keppra at the last visit due to increase in seizures. I would be happy to see her but it sounds as if she only wants to see the MD. Please offer her an appointment with Dr. Jannifer Franklin.

## 2016-01-07 NOTE — Telephone Encounter (Signed)
I called pt to discuss. Pt has an appt tomorrow with Jinny Blossom, NP but I was going to offer her an appt with Dr. Jannifer Franklin. No answer, left a message asking her to call me back.

## 2016-01-08 ENCOUNTER — Ambulatory Visit: Payer: Medicare Other | Admitting: Adult Health

## 2016-01-14 ENCOUNTER — Encounter: Payer: Self-pay | Admitting: Neurology

## 2016-01-14 ENCOUNTER — Ambulatory Visit (INDEPENDENT_AMBULATORY_CARE_PROVIDER_SITE_OTHER): Payer: Medicare Other | Admitting: Neurology

## 2016-01-14 VITALS — BP 177/77 | HR 80 | Ht 64.0 in | Wt 259.0 lb

## 2016-01-14 DIAGNOSIS — I480 Paroxysmal atrial fibrillation: Secondary | ICD-10-CM

## 2016-01-14 DIAGNOSIS — G40909 Epilepsy, unspecified, not intractable, without status epilepticus: Secondary | ICD-10-CM | POA: Diagnosis not present

## 2016-01-14 DIAGNOSIS — R55 Syncope and collapse: Secondary | ICD-10-CM

## 2016-01-14 DIAGNOSIS — R42 Dizziness and giddiness: Secondary | ICD-10-CM

## 2016-01-14 DIAGNOSIS — R569 Unspecified convulsions: Secondary | ICD-10-CM

## 2016-01-14 MED ORDER — LEVETIRACETAM 500 MG PO TABS: ORAL_TABLET | ORAL | Status: DC

## 2016-01-14 NOTE — Progress Notes (Signed)
Reason for visit: Seizures  Cathy Gonzalez is an 70 y.o. female  History of present illness:  Cathy Gonzalez is a 70 year old right-handed black female with a history of nocturnal seizures. The patient has a history of hepatitis C, she has been on carbamazepine with good control of her seizures for number of years, but given the liver disease she has been transitioned to [place]. The patient also takes Lyrica for her peripheral neuropathy discomfort associated with diabetes. The patient is markedly overweight, and she has obstructive sleep apnea on CPAP. Recently, she has not had the parts to continue the CPAP, she has been off of this treatment. The patient has had several seizures that are always nocturnal in nature. The patient had a recent seizure within the last several days. The patient knows that she has had a seizure when she wakes up with a bitten tongue. The patient will be getting back on CPAP shortly. She has been placed on a cholesterol medication that resulted in muscle cramps, and she went off the medication. The patient has had some degenerative arthritis affecting her right knee. More recently, the patient has had episodes of dizziness that generally occur with standing, and may be associated with near syncope. The patient reports that she has episodes of heart palpitations, racing of the heart that may be associated with the episodes of dizziness. The patient also has some mild chest discomfort at times. The patient returns to the office for an evaluation.  Past Medical History  Diagnosis Date  . Hypertension   . Diabetes mellitus   . Arthritis   . Hepatitis C   . Hypokalemia   . Nocturnal seizures ([place]) 05/14/2015  . Morbidly obese ([place])   . OSA on CPAP   . Diabetic neuropathy, type II diabetes mellitus [facility])     Past Surgical History  Procedure Laterality Date  . Abdominal hysterectomy    . Cataract extraction Left   . Esophagogastroduodenoscopy (egd) with propofol N/A 08/21/2015     Procedure: ESOPHAGOGASTRODUODENOSCOPY (EGD) WITH PROPOFOL;  Surgeon: Carol Ada, MD;  [facility];  Service: Endoscopy;  Laterality: N/A;  . Colonoscopy with propofol N/A 08/21/2015    Procedure: COLONOSCOPY WITH PROPOFOL;  Surgeon: Carol Ada, MD;  [facility];  Service: Endoscopy;  Laterality: N/A;    Family History  Problem Relation Age of Onset  . Diabetes Sister   . Seizures Neg Hx   . Diabetes Brother   . Cancer Sister     breast    Social history:  reports that she has never smoked. She has never used smokeless tobacco. She reports that she does not drink alcohol or use illicit drugs.   No Known Allergies  Medications:  Prior to Admission medications   Medication Sig Start Date End Date Taking? Authorizing Provider  amLODipine (NORVASC) 10 MG tablet Take 10 mg by mouth daily.    Historical Provider, MD  aspirin EC 81 MG tablet Take 1 tablet (81 mg total) by mouth daily. 02/12/14   Ripudeep Krystal Eaton, MD  chlorthalidone (HYGROTON) 25 MG tablet Take 25 mg by mouth daily.    Historical Provider, MD  colchicine 0.6 MG tablet Take 0.6 mg by mouth daily.     Historical Provider, MD  diclofenac sodium (VOLTAREN) 1 % GEL Apply 1 application topically 2 (two) times daily. Apply 2 inches of medication on affected joint and rub in well. 07/23/15   Historical Provider, MD  HYDROcodone-acetaminophen (NORCO/VICODIN) 5-325 MG tablet Take 1-2  tablets by mouth every 4 (four) hours as needed for moderate pain. 12/03/15   Orpah Greek, MD  levETIRAcetam (KEPPRA) 500 MG tablet Take 2 tablets (1,000 mg total) by mouth 2 (two) times daily. 09/13/15   Ward Givens, NP  metFORMIN (GLUCOPHAGE) 500 MG tablet Take 500 mg by mouth at bedtime.     Historical Provider, MD  montelukast (SINGULAIR) 10 MG tablet Take 10 mg by mouth at bedtime.    Historical Provider, MD  pregabalin (LYRICA) 150 MG capsule Take 150-300 mg by mouth 2 (two) times daily. Takes 150mg  in the AM and  300mg  in the PM.    Historical Provider, MD  sucralfate (CARAFATE) 1 G tablet Take 1 g by mouth 4 (four) times daily -  with meals and at bedtime.    Historical Provider, MD  tiZANidine (ZANAFLEX) 4 MG tablet Take 4 mg by mouth at bedtime.     Historical Provider, MD  valsartan (DIOVAN) 320 MG tablet Take 320 mg by mouth daily.    Historical Provider, MD    ROS:  Out of a complete 14 system review of symptoms, the patient complains only of the following symptoms, and all other reviewed systems are negative.  Dizziness Seizures  Blood pressure 177/77, pulse 80, height 5\' 4"  (1.626 m), weight 259 lb (117.482 kg).   Blood pressure, left arm, standing is 176/100. Blood pressure, left arm, sitting is 144/92.  Physical Exam  General: The patient is alert and cooperative at the time of the examination. The patient is markedly obese.  Skin: No significant peripheral edema is noted.   Neurologic Exam  Mental status: The patient is alert and oriented x 3 at the time of the examination. The patient has apparent normal recent and remote memory, with an apparently normal attention span and concentration ability.   Cranial nerves: Facial symmetry is present. Speech is normal, no aphasia or dysarthria is noted. Extraocular movements are full. Visual fields are full.  Motor: The patient has good strength in all 4 extremities.  Sensory examination: Soft touch sensation is symmetric on the face, arms, and legs.  Coordination: The patient has good finger-nose-finger and heel-to-shin bilaterally.  Gait and station: The patient has a normal gait. Tandem gait is unsteady. Romberg is negative. No drift is seen.  Reflexes: Deep tendon reflexes are symmetric, but are depressed.   Assessment/Plan:  1. Nocturnal seizures  2. Episodic dizziness, near syncope  3. Diabetes  4. Diabetic peripheral neuropathy  The patient continues to have some nocturnal seizures. The patient has recently been  increased on the Keppra taking 1000 mg twice daily. We will go up on the medication taking 1000 mg in the morning and 1500 mg in the evening. The patient is to go back on the CPAP which may actually help her seizure control. She has reported episodes of dizziness associated with palpitations of the heart, we will check a cardiac monitor study. The patient will follow-up in 6 months, sooner if needed.  Jill Alexanders MD 01/14/2016 8:29 PM  [facility] [address] [place], [place] [address]  Phone [phone] Fax [phone]

## 2016-01-21 ENCOUNTER — Ambulatory Visit (INDEPENDENT_AMBULATORY_CARE_PROVIDER_SITE_OTHER): Payer: Medicare Other

## 2016-01-21 DIAGNOSIS — I480 Paroxysmal atrial fibrillation: Secondary | ICD-10-CM

## 2016-01-21 DIAGNOSIS — R569 Unspecified convulsions: Secondary | ICD-10-CM

## 2016-01-21 DIAGNOSIS — R42 Dizziness and giddiness: Secondary | ICD-10-CM

## 2016-01-21 DIAGNOSIS — R55 Syncope and collapse: Secondary | ICD-10-CM | POA: Diagnosis not present

## 2016-01-21 DIAGNOSIS — G40909 Epilepsy, unspecified, not intractable, without status epilepticus: Secondary | ICD-10-CM | POA: Diagnosis not present

## 2016-01-24 ENCOUNTER — Ambulatory Visit (INDEPENDENT_AMBULATORY_CARE_PROVIDER_SITE_OTHER): Payer: Medicare Other | Admitting: Podiatry

## 2016-01-24 ENCOUNTER — Encounter: Payer: Self-pay | Admitting: Podiatry

## 2016-01-24 ENCOUNTER — Ambulatory Visit
Admission: RE | Admit: 2016-01-24 | Discharge: 2016-01-24 | Disposition: A | Discharge status: Home / Self Care | Payer: Medicare Other | Source: Ambulatory Visit

## 2016-01-24 VITALS — BP 130/74 | HR 72 | Resp 12

## 2016-01-24 DIAGNOSIS — M7750 Other enthesopathy of unspecified foot: Secondary | ICD-10-CM | POA: Diagnosis not present

## 2016-01-24 DIAGNOSIS — Z1231 Encounter for screening mammogram for malignant neoplasm of breast: Secondary | ICD-10-CM

## 2016-01-24 DIAGNOSIS — M722 Plantar fascial fibromatosis: Secondary | ICD-10-CM | POA: Diagnosis not present

## 2016-01-24 DIAGNOSIS — E1142 Type 2 diabetes mellitus with diabetic polyneuropathy: Secondary | ICD-10-CM

## 2016-01-24 DIAGNOSIS — M779 Enthesopathy, unspecified: Secondary | ICD-10-CM

## 2016-01-24 DIAGNOSIS — M778 Other enthesopathies, not elsewhere classified: Secondary | ICD-10-CM

## 2016-01-26 NOTE — Progress Notes (Signed)
She presents today for follow-up of her plantar fasciitis and her neuropathy. She has been applying so much Voltaren gel to the dorsal aspect of her foot she is starting to bleacher skin out. She states that she will still stop using the Voltaren gel.  Objective: Vital signs are stable she is alert and oriented 3 no pain on palpation medial calcaneal tubercle bilateral. She does have pain on palpation to the first metatarsophalangeal joints bilateral.  Assessment: Plantar fasciitis resolving diabetic peripheral neuropathy under control. Capsulitis hallux bilateral.  Plan: Injected first metatarsophalangeal joints today. Follow-up with her in a few months if necessary.

## 2016-01-29 ENCOUNTER — Other Ambulatory Visit: Payer: Self-pay | Admitting: Adult Health

## 2016-01-30 ENCOUNTER — Ambulatory Visit (INDEPENDENT_AMBULATORY_CARE_PROVIDER_SITE_OTHER): Payer: Medicare Other | Admitting: Family Medicine

## 2016-01-30 VITALS — BP 123/79 | HR 80 | Temp 98.0°F | Resp 18 | Ht 63.0 in | Wt 263.0 lb

## 2016-01-30 DIAGNOSIS — M109 Gout, unspecified: Secondary | ICD-10-CM | POA: Diagnosis not present

## 2016-01-30 DIAGNOSIS — M25551 Pain in right hip: Secondary | ICD-10-CM

## 2016-01-30 MED ORDER — HYDROCODONE-ACETAMINOPHEN 5-325 MG PO TABS: 1.0000 | ORAL_TABLET | Freq: Four times a day (QID) | ORAL | Status: DC | PRN

## 2016-01-30 MED ORDER — PREDNISONE 20 MG PO TABS: ORAL_TABLET | ORAL | Status: DC

## 2016-01-30 NOTE — Progress Notes (Signed)
70 yo obese woman who works at [employer] part time.  She has 5 days of progressive right hip pain, worse with straight leg raising or externally rotating.  Last hemoglobin A1C was 6.3 No trauma. Already worked today.  Can sit on a stool  Objective:  NAD BP 123/79 mmHg  Pulse 80  Temp(Src) 98 F (36.7 C)  Resp 18  Ht 5\' 3"  (1.6 m)  Wt 263 lb (119.296 kg)  BMI 46.60 kg/m2 2+ bilateral lower extremity edema Pain with internally rotating the right hip.  Pain with palpation of posterior trochanter. Knee is nontender. Abdomen:  Soft, nontender.  No groin tenderness.  Assessment:  Piriformis tear with mild trochanteric bursitis.  Plan:  Prednisone 40 qd x 5 Stop the chlorthalidone as it may lead to gout. Recheck 48 hours.  Robyn Haber, MD

## 2016-01-30 NOTE — Patient Instructions (Addendum)
Chlorthalidone can lead to gout, so I am recommending that you stop this medicine.  Let us recheck the hip in 2 days.

## 2016-02-01 ENCOUNTER — Ambulatory Visit (INDEPENDENT_AMBULATORY_CARE_PROVIDER_SITE_OTHER): Payer: Medicare Other | Admitting: Family Medicine

## 2016-02-01 VITALS — BP 114/78 | HR 88 | Temp 98.1°F | Resp 20 | Ht 63.0 in | Wt 265.4 lb

## 2016-02-01 DIAGNOSIS — M25551 Pain in right hip: Secondary | ICD-10-CM | POA: Diagnosis not present

## 2016-02-01 MED ORDER — PREDNISONE 20 MG PO TABS: ORAL_TABLET | ORAL | Status: DC

## 2016-02-01 NOTE — Patient Instructions (Addendum)
1. No sodas or sweet tea  2. Finish eating your late meal before 7:30 pm  3. Eggs for breakfast, hard boiled eggs for a snack.  4. Always pack your lunch

## 2016-02-01 NOTE — Progress Notes (Signed)
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  She has stopped the chlorthalidone.   Objective:  BP 114/78 mmHg  Pulse 88  Temp(Src) 98.1 F (36.7 C) (Oral)  Resp 20  Ht 5\' 3"  (1.6 m)  Wt 265 lb 6.4 oz (120.385 kg)  BMI 47.03 kg/m2  SpO2 96% Hip tenderness is much  Less. Favors left leg  Assessment:  Needs to eat healthy, stop the bologna.  Improving hip pain.  Plan:  Right hip pain - Plan: predniSONE (DELTASONE) 20 MG tablet  Morbid obesity, unspecified obesity type ([place])  Return to discuss weight  And diet as we did extensively today  Robyn Haber, MD

## 2016-02-06 ENCOUNTER — Telehealth: Payer: Self-pay | Admitting: *Deleted

## 2016-02-06 NOTE — Telephone Encounter (Signed)
Pt states Dr. Milinda Pointer told pt at last visit to increase Lyrica.  Pt states she told Dr. Criss Rosales of Dr. Stephenie Acres orders and she said that Dr. Milinda Pointer should manage the Lyrica if he wanted to increase the dosing.

## 2016-02-06 NOTE — Telephone Encounter (Signed)
ok 

## 2016-02-07 MED ORDER — PREGABALIN 150 MG PO CAPS: ORAL_CAPSULE | ORAL | Status: DC

## 2016-02-07 NOTE — Telephone Encounter (Addendum)
-----   Message from Garrel Ridgel, [place] sent at 02/07/2016  6:54 AM EDT ----- The max is 600 for DPN. So Lyrica 150, Two by mouth twice daily. If this doesn't help then this patient should really be seen by a pain clinic. ----- Message -----    From: Andres Ege, RN    Sent: 02/06/2016   5:01 PM      To: Max Villa Herb, DPM  Dr. Milinda Pointer, what is the new dosing for pt's Lyrica?  Cathy Gonzalez 02/07/2016-Informed pt of Dr. Stephenie Acres new Lyrica orders, to follow up with Dr. Milinda Pointer in a month,and will call to pt's pharmacy.  Lyrica 150mg  called to [place].

## 2016-02-14 ENCOUNTER — Other Ambulatory Visit: Payer: Self-pay | Admitting: Family Medicine

## 2016-02-14 ENCOUNTER — Ambulatory Visit
Admission: RE | Admit: 2016-02-14 | Discharge: 2016-02-14 | Disposition: A | Discharge status: Home / Self Care | Payer: Medicare Other | Source: Ambulatory Visit | Attending: Family Medicine | Admitting: Family Medicine

## 2016-02-14 DIAGNOSIS — E0841 Diabetes mellitus due to underlying condition with diabetic mononeuropathy: Secondary | ICD-10-CM

## 2016-02-14 DIAGNOSIS — M71551 Other bursitis, not elsewhere classified, right hip: Secondary | ICD-10-CM

## 2016-02-24 ENCOUNTER — Telehealth: Payer: Self-pay | Admitting: Neurology

## 2016-02-24 NOTE — Telephone Encounter (Signed)
I called the patient. The cardiac monitoring study was OK.

## 2016-03-11 ENCOUNTER — Other Ambulatory Visit: Payer: Self-pay | Admitting: Internal Medicine

## 2016-03-11 DIAGNOSIS — B182 Chronic viral hepatitis C: Secondary | ICD-10-CM

## 2016-03-12 ENCOUNTER — Telehealth: Payer: Self-pay | Admitting: *Deleted

## 2016-03-12 NOTE — Telephone Encounter (Signed)
-----   Message from Thayer Headings, MD sent at 03/12/2016  8:55 AM EDT ----- Regarding: RE: ultrasound If her GI doctor is doing an ultrasound every 6 months, she doesn't need one from me if that is the case and she doesn't need to come back.  thanks ----- Message -----    From: Georgena Spurling, CMA    Sent: 03/11/2016   2:57 PM      To: Thayer Headings, MD Subject: ultrasound                                     Called patient to schedule an abdominal ultrasound for July and patient said her GI doctor already ordered one, which she had. She also had an upper endoscopy. I see one in January, please advise. Thanks

## 2016-03-12 NOTE — Telephone Encounter (Signed)
Called and left patient a voice mail. When she calls back if she is getting routine ultrasounds, ok to cancel appt with Dr. Linus Salmons for 04/03/16. Myrtis Hopping

## 2016-03-13 ENCOUNTER — Telehealth: Payer: Self-pay | Admitting: *Deleted

## 2016-03-13 NOTE — Telephone Encounter (Signed)
Patient called back stating the MD that ordered her ultrasound only wants to see her back in 3 years. She has questions about the Hep C medication, wanted to know if it causes hair loss. [name], pharmacist stated it does not; patient wanted to keep upcoming appt to discuss need for ongoing care with Dr. Linus Salmons. Cathy Gonzalez

## 2016-03-24 ENCOUNTER — Ambulatory Visit (HOSPITAL_COMMUNITY): Payer: Medicare Other

## 2016-04-03 ENCOUNTER — Encounter: Payer: Self-pay | Admitting: Internal Medicine

## 2016-04-03 ENCOUNTER — Ambulatory Visit (INDEPENDENT_AMBULATORY_CARE_PROVIDER_SITE_OTHER): Payer: Medicare Other | Admitting: Internal Medicine

## 2016-04-03 VITALS — BP 133/88 | HR 80 | Temp 98.5°F | Wt 260.0 lb

## 2016-04-03 DIAGNOSIS — K7460 Unspecified cirrhosis of liver: Secondary | ICD-10-CM | POA: Diagnosis not present

## 2016-04-03 DIAGNOSIS — B182 Chronic viral hepatitis C: Secondary | ICD-10-CM | POA: Diagnosis not present

## 2016-04-03 NOTE — Assessment & Plan Note (Signed)
Treated and cured. 

## 2016-04-03 NOTE — Progress Notes (Signed)
   Subjective:    Patient ID: Cathy Gonzalez, female    DOB: 03-10-1946, 70 y.o.   MRN: [id number]  HPI She comes in for follow up of cirrhosis.   She has genotype 1a, elastography with F3/4 and ultrasound with nodular hepatic contour concerning for cirrhosis.  Completed HCV treatment and SVR12 negative confirming cure. Does not drink alcohol.   Sees Dr. Benson Norway of GI and had  Colonoscopy and EGD, no varices noted per patient.     Review of Systems  Constitutional: Negative for fatigue.  Gastrointestinal: Negative for nausea and diarrhea.  Skin: Negative for rash.  Neurological: Negative for dizziness and light-headedness.       Objective:   Physical Exam  Constitutional: She appears well-developed and well-nourished. No distress.  Eyes: No scleral icterus.  Cardiovascular: Normal rate, regular rhythm and normal heart sounds.   No murmur heard. Pulmonary/Chest: Effort normal and breath sounds normal. No respiratory distress.  Skin: No rash noted.   Social History   Social History  . Marital Status: Single    Spouse Name: N/A  . Number of Children: 4  . Years of Education: 12   Occupational History  . [name]    Social History Main Topics  . Smoking status: Never Smoker   . Smokeless tobacco: Never Used  . Alcohol Use: No  . Drug Use: No  . Sexual Activity: No   Other Topics Concern  . Not on file   Social History Narrative   Patient occasionally drinks caffeine.   Patient is right handed.         Assessment & Plan:

## 2016-04-03 NOTE — Assessment & Plan Note (Signed)
[place] screen with ultrasound now and every 6 months. Patient understands it is to look for any concerning growth.

## 2016-04-17 ENCOUNTER — Other Ambulatory Visit: Payer: Medicare Other

## 2016-04-23 DIAGNOSIS — G40001 Localization-related (focal) (partial) idiopathic epilepsy and epileptic syndromes with seizures of localized onset, not intractable, with status epilepticus: Secondary | ICD-10-CM | POA: Diagnosis not present

## 2016-04-23 DIAGNOSIS — E119 Type 2 diabetes mellitus without complications: Secondary | ICD-10-CM | POA: Diagnosis not present

## 2016-04-23 DIAGNOSIS — Z6841 Body Mass Index (BMI) 40.0 and over, adult: Secondary | ICD-10-CM | POA: Diagnosis not present

## 2016-04-24 ENCOUNTER — Ambulatory Visit: Payer: Medicare Other | Admitting: Podiatry

## 2016-04-30 DIAGNOSIS — M7061 Trochanteric bursitis, right hip: Secondary | ICD-10-CM | POA: Diagnosis not present

## 2016-04-30 DIAGNOSIS — M545 Low back pain: Secondary | ICD-10-CM | POA: Diagnosis not present

## 2016-05-01 ENCOUNTER — Encounter: Payer: Self-pay | Admitting: Podiatry

## 2016-05-01 ENCOUNTER — Ambulatory Visit (INDEPENDENT_AMBULATORY_CARE_PROVIDER_SITE_OTHER): Payer: Medicare Other | Admitting: Podiatry

## 2016-05-01 DIAGNOSIS — G5761 Lesion of plantar nerve, right lower limb: Secondary | ICD-10-CM | POA: Diagnosis not present

## 2016-05-01 DIAGNOSIS — B351 Tinea unguium: Secondary | ICD-10-CM | POA: Diagnosis not present

## 2016-05-01 DIAGNOSIS — G5762 Lesion of plantar nerve, left lower limb: Secondary | ICD-10-CM

## 2016-05-01 DIAGNOSIS — M722 Plantar fascial fibromatosis: Secondary | ICD-10-CM | POA: Diagnosis not present

## 2016-05-01 DIAGNOSIS — M79676 Pain in unspecified toe(s): Secondary | ICD-10-CM

## 2016-05-01 DIAGNOSIS — E1142 Type 2 diabetes mellitus with diabetic polyneuropathy: Secondary | ICD-10-CM | POA: Diagnosis not present

## 2016-05-01 MED ORDER — PREGABALIN 150 MG PO CAPS: ORAL_CAPSULE | ORAL | 5 refills | Status: DC

## 2016-05-01 NOTE — Progress Notes (Signed)
She presents today for follow-up of her plantar fasciitis stating that he is doing much better however now is just hurting her. She points to the forefoot bilaterally. She states that she has pain with ambulation in the forefoot as she points to the second interdigital space left in the third interdigital space right. She is also requesting a pair of diabetic shoes. I would like to have her nails cut.  Objective: Vital signs are stable alert and oriented 3 pulses are palpable. She does have decreased posterior tibial pulses however she also has a slight decrease in sensorium per Semmes-Weinstein monofilament and vibratory sensation. She has mild flexible hammertoe deformities and hallux valgus deformities which are asymptomatic. Cutaneous evaluation of his face thick yellow dystrophic onychomycotic nails which are painful on palpation as well as debridement. She has pain on palpation to the second interdigital space left foot and the third interdigital space right foot consistent with capsulitis and/or neuroma.  Assessment: Diabetes mellitus with diabetic peripheral neuropathy and angiopathy. Hammertoe deformities hallux valgus deformities no open lesions or wounds. Pain in limb secondary to onychomycosis.  Plan: Discussed etiology pathology conservative versus surgical therapies. I injected Kenalog and local anesthetic to the third interdigital space of the right foot second interdigital space of the left foot filled out paperwork for her diabetic shoes and debrided her toenails bilaterally.

## 2016-05-06 ENCOUNTER — Other Ambulatory Visit: Payer: Medicare Other

## 2016-05-06 DIAGNOSIS — M545 Low back pain: Secondary | ICD-10-CM | POA: Diagnosis not present

## 2016-05-06 DIAGNOSIS — M7061 Trochanteric bursitis, right hip: Secondary | ICD-10-CM | POA: Diagnosis not present

## 2016-05-07 ENCOUNTER — Ambulatory Visit
Admission: RE | Admit: 2016-05-07 | Discharge: 2016-05-07 | Disposition: A | Discharge status: Home / Self Care | Payer: Medicare Other | Source: Ambulatory Visit | Attending: Internal Medicine | Admitting: Internal Medicine

## 2016-05-07 ENCOUNTER — Telehealth: Payer: Self-pay | Admitting: *Deleted

## 2016-05-07 DIAGNOSIS — K760 Fatty (change of) liver, not elsewhere classified: Secondary | ICD-10-CM | POA: Diagnosis not present

## 2016-05-07 DIAGNOSIS — E089 Diabetes mellitus due to underlying condition without complications: Secondary | ICD-10-CM | POA: Diagnosis not present

## 2016-05-07 DIAGNOSIS — K7460 Unspecified cirrhosis of liver: Secondary | ICD-10-CM

## 2016-05-07 DIAGNOSIS — G4762 Sleep related leg cramps: Secondary | ICD-10-CM | POA: Diagnosis not present

## 2016-05-07 DIAGNOSIS — I10 Essential (primary) hypertension: Secondary | ICD-10-CM | POA: Diagnosis not present

## 2016-05-07 NOTE — Telephone Encounter (Signed)
-----   Message from Thayer Headings, MD sent at 05/07/2016  2:25 PM EDT ----- Please let her know her ultrasound is ok.  No concerns  Will repeat in 6 months.  thanks

## 2016-05-07 NOTE — Telephone Encounter (Signed)
Patient notified

## 2016-05-14 ENCOUNTER — Telehealth: Payer: Self-pay | Admitting: *Deleted

## 2016-05-14 NOTE — Telephone Encounter (Addendum)
Pt states her insurance requires per-cert on Lyrica. 05/16/2016-Pt states she needs her lyrica authorized and has a new insurance she got Monday.  Pt states she will bring the new card in Monday.12/19/2016-Received fax request for Lyrica 300mg  #60 one tablet bid. Dr. Milinda Pointer ordered refill as previously +5refills. Orders called to [facility].

## 2016-06-06 DIAGNOSIS — E876 Hypokalemia: Secondary | ICD-10-CM | POA: Diagnosis not present

## 2016-06-06 DIAGNOSIS — M62838 Other muscle spasm: Secondary | ICD-10-CM | POA: Diagnosis not present

## 2016-06-06 DIAGNOSIS — E089 Diabetes mellitus due to underlying condition without complications: Secondary | ICD-10-CM | POA: Diagnosis not present

## 2016-06-06 DIAGNOSIS — I10 Essential (primary) hypertension: Secondary | ICD-10-CM | POA: Diagnosis not present

## 2016-06-10 ENCOUNTER — Other Ambulatory Visit: Payer: Self-pay

## 2016-06-10 MED ORDER — PREGABALIN 300 MG PO CAPS: 300.0000 mg | ORAL_CAPSULE | Freq: Two times a day (BID) | ORAL | 5 refills | Status: DC

## 2016-07-14 ENCOUNTER — Ambulatory Visit: Payer: Medicare Other | Admitting: Adult Health

## 2016-07-14 DIAGNOSIS — E0840 Diabetes mellitus due to underlying condition with diabetic neuropathy, unspecified: Secondary | ICD-10-CM | POA: Diagnosis not present

## 2016-07-14 DIAGNOSIS — E118 Type 2 diabetes mellitus with unspecified complications: Secondary | ICD-10-CM | POA: Diagnosis not present

## 2016-07-14 DIAGNOSIS — I10 Essential (primary) hypertension: Secondary | ICD-10-CM | POA: Diagnosis not present

## 2016-07-14 DIAGNOSIS — Z23 Encounter for immunization: Secondary | ICD-10-CM | POA: Diagnosis not present

## 2016-07-22 ENCOUNTER — Other Ambulatory Visit: Payer: Self-pay | Admitting: Neurology

## 2016-07-26 IMAGING — CR DG KNEE COMPLETE 4+V*R*
4 series · 4 of 4 positions shown · non-contrast
Comparison: December 25, 2011

CLINICAL DATA: Pain for 1 day

EXAM:
RIGHT KNEE - COMPLETE 4+ VIEW

[knee ap]
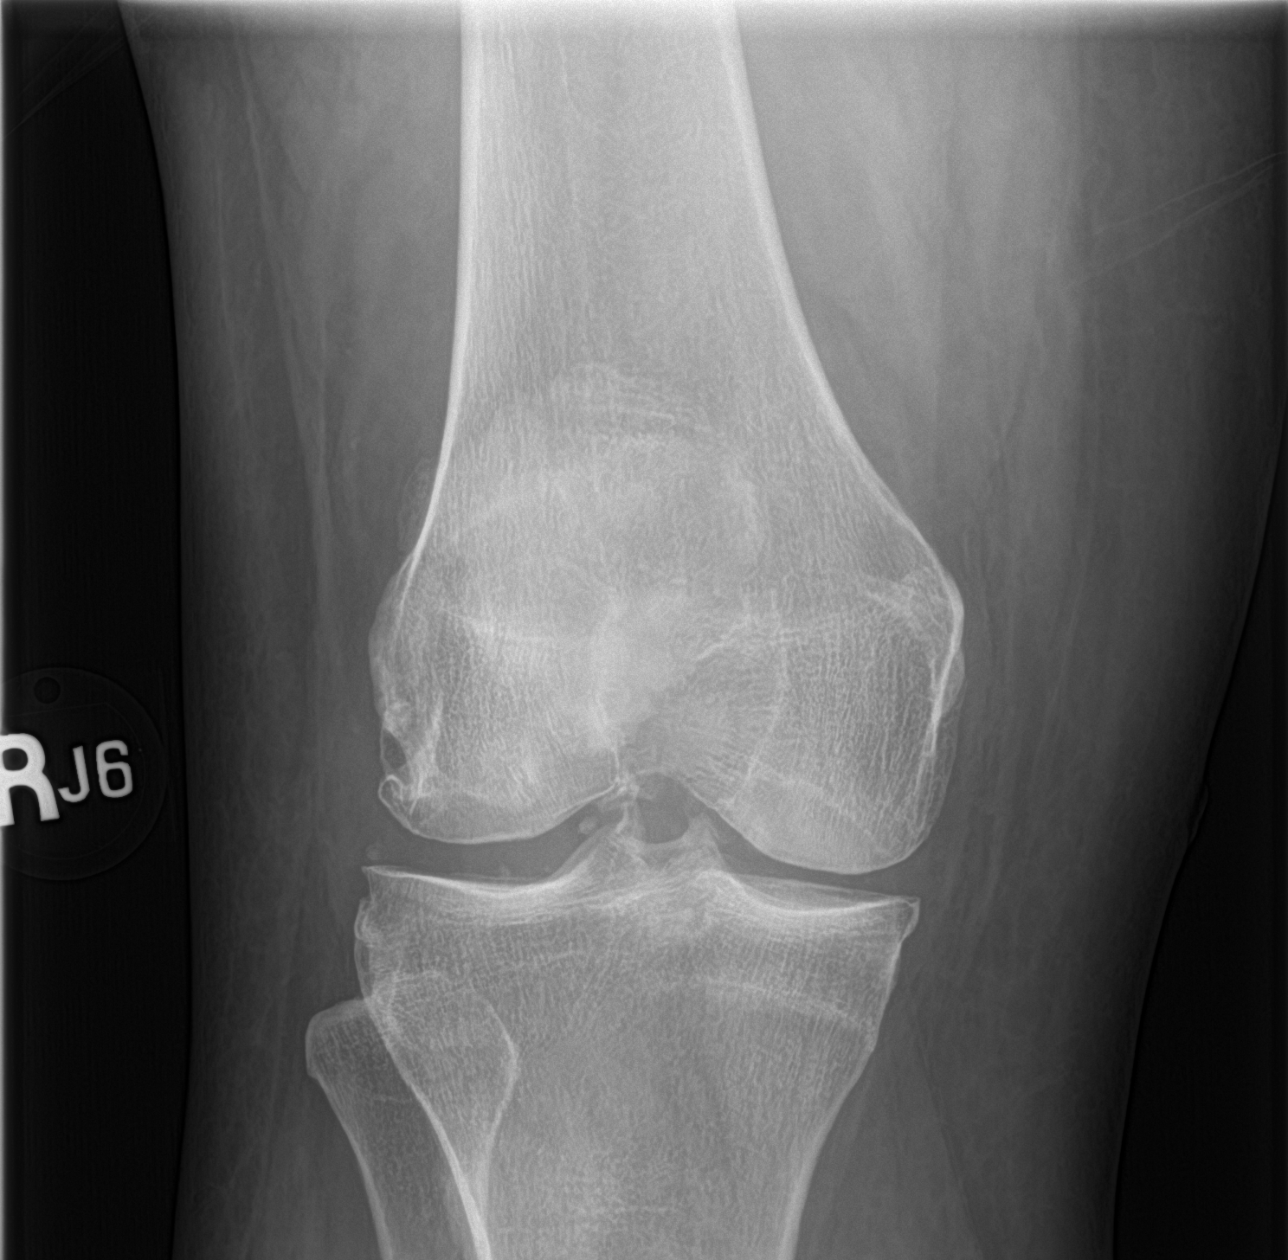

[knee lat]
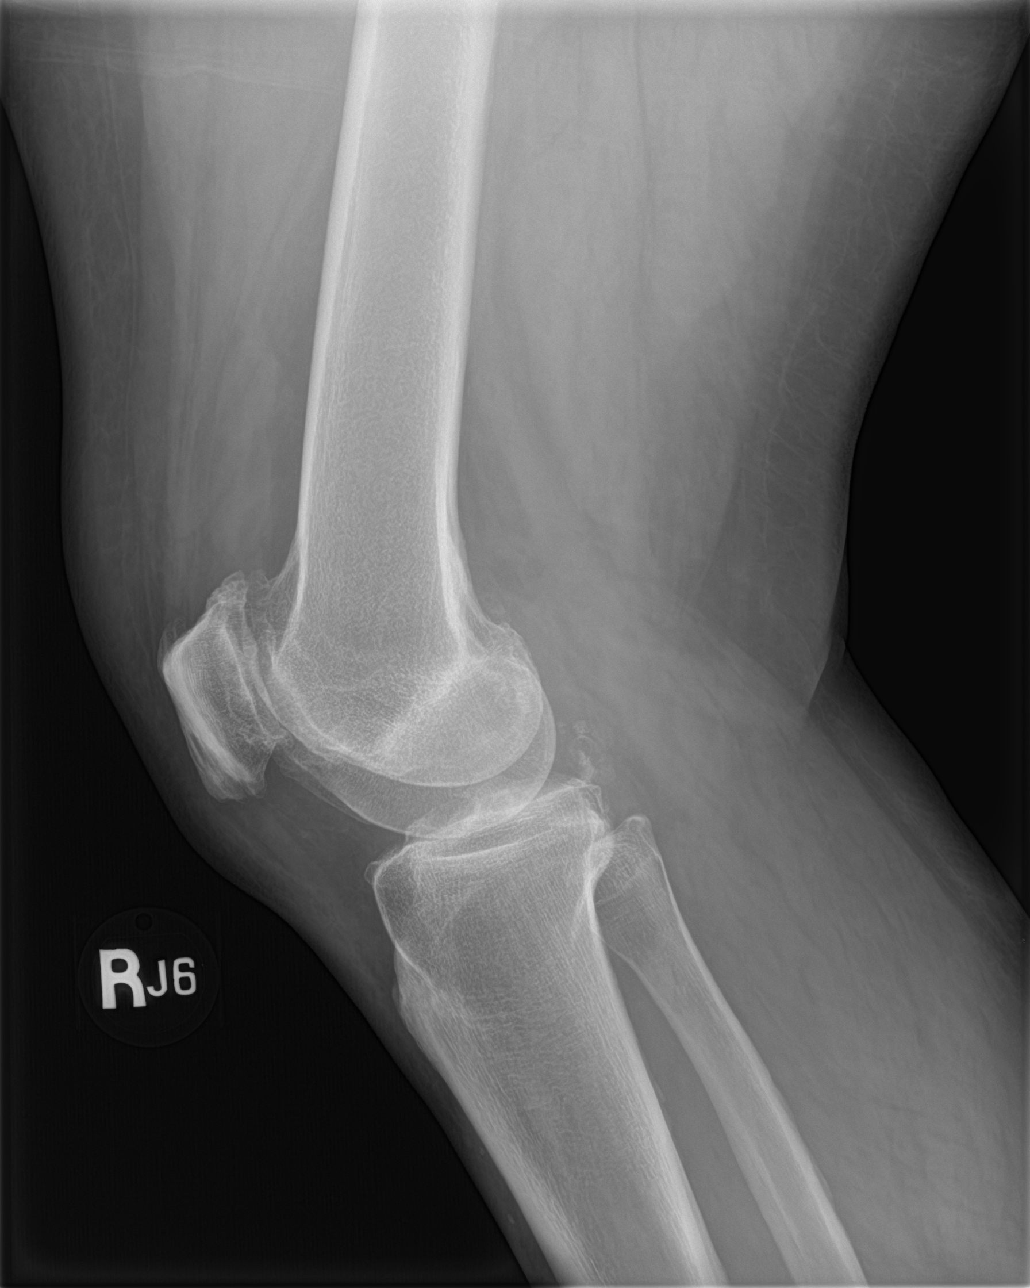

[knee obl (1 of 2)]
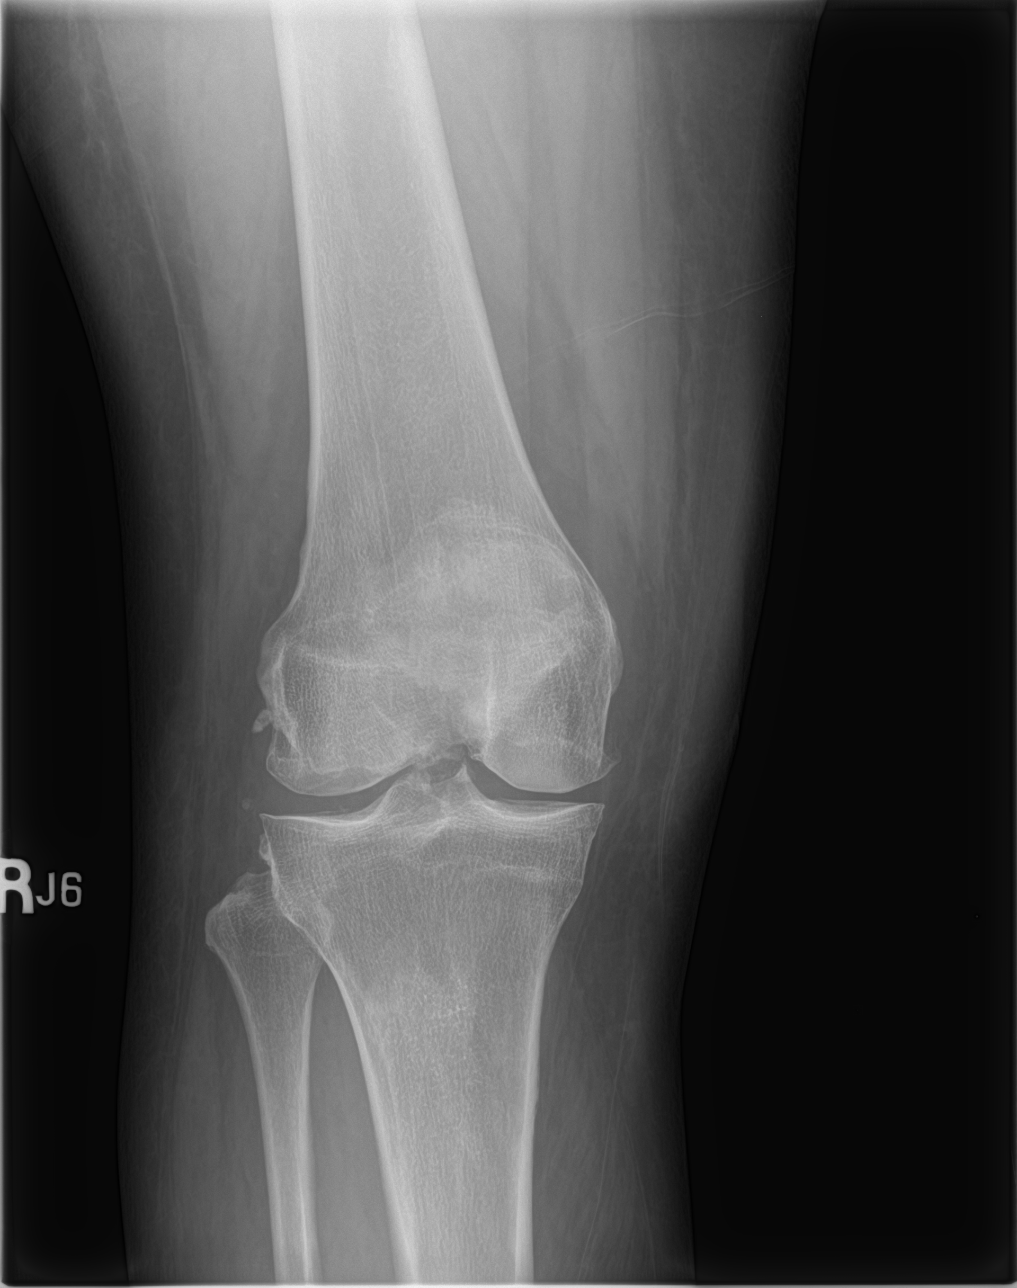

[knee obl (2 of 2)]
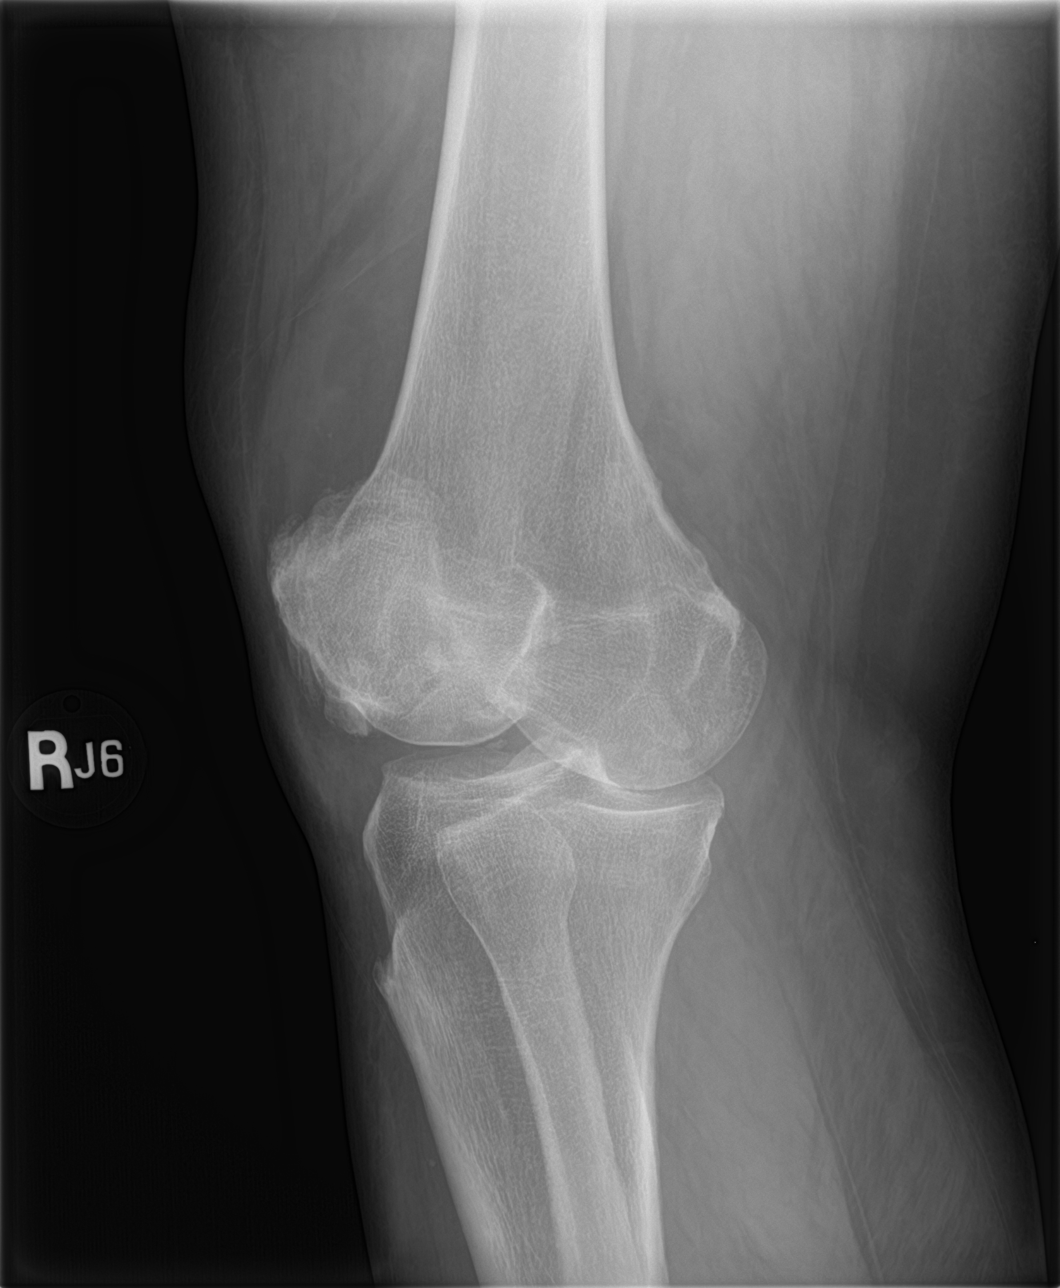

[4 of 4 positions shown; findings below may reference images not displayed]

FINDINGS: Frontal, lateral, and bilateral oblique views were obtained. There
is no demonstrable fracture or dislocation. There is a fairly small
but present joint effusion. There is osteoarthritic change with
narrowing most marked in the patellofemoral joint region. There is
spurring in all compartments. No erosive change. There is a degree
of prepatellar soft tissue swelling.
IMPRESSION: Osteoarthritic change, most marked in the patellofemoral joint
region, a finding also present on prior study. There is a joint
effusion. No fracture or dislocation. There is a degree of
prepatellar soft tissue swelling.

## 2016-08-07 ENCOUNTER — Ambulatory Visit (INDEPENDENT_AMBULATORY_CARE_PROVIDER_SITE_OTHER): Payer: Medicare Other | Admitting: Podiatry

## 2016-08-07 ENCOUNTER — Encounter: Payer: Self-pay | Admitting: Podiatry

## 2016-08-07 DIAGNOSIS — M778 Other enthesopathies, not elsewhere classified: Secondary | ICD-10-CM

## 2016-08-07 DIAGNOSIS — M722 Plantar fascial fibromatosis: Secondary | ICD-10-CM | POA: Diagnosis not present

## 2016-08-07 DIAGNOSIS — M779 Enthesopathy, unspecified: Secondary | ICD-10-CM

## 2016-08-07 DIAGNOSIS — E1142 Type 2 diabetes mellitus with diabetic polyneuropathy: Secondary | ICD-10-CM | POA: Diagnosis not present

## 2016-08-07 DIAGNOSIS — M7750 Other enthesopathy of unspecified foot: Secondary | ICD-10-CM

## 2016-08-07 NOTE — Progress Notes (Signed)
She presents today for follow-up of her plantar fasciitis and neuropathy. She states that they've swollen so much and using the Lyrica 300 mg twice a day really doesn't seem to help. She states that the ankle seem to be hurting the worse. When I asked her about her plantar fasciitis she states that is well and has been doing very well recently.  Objective: Vital signs are stable she is alert and oriented 3 pulses are palpable. She has pain on palpation anterior ankles with edema in the legs and feet. No calf pain is noted. No open lesions or wounds are noted. No reproducible pain on palpation of the medial calcaneal tubercle.  Assessment: Edema bilaterally with diabetic peripheral neuropathy more than likely the edema is caused by the Lyrica as well however there is nothing as we can treat her pain with and she does not want to be without it. Also amlodipine is another thing that she could be swelling from. She also has not been taking her Lasix.  Plan: I encouraged her to talk to her doctor about changing her medications including Lyrica amlodipine and starting back on Lasix. I injected the anterior capsule of the ankle bilaterally today alleviating her symptoms upon leaving our office. I will follow-up with her in 1 month for routine nail debridement.

## 2016-08-11 ENCOUNTER — Ambulatory Visit: Payer: Medicare Other | Admitting: Adult Health

## 2016-08-11 VITALS — BP 127/78 | HR 90 | Ht 63.0 in | Wt 267.0 lb

## 2016-08-11 DIAGNOSIS — G40909 Epilepsy, unspecified, not intractable, without status epilepticus: Secondary | ICD-10-CM

## 2016-08-11 DIAGNOSIS — G4733 Obstructive sleep apnea (adult) (pediatric): Secondary | ICD-10-CM | POA: Diagnosis not present

## 2016-08-11 DIAGNOSIS — Z9989 Dependence on other enabling machines and devices: Secondary | ICD-10-CM

## 2016-08-11 DIAGNOSIS — Z5181 Encounter for therapeutic drug level monitoring: Secondary | ICD-10-CM

## 2016-08-11 DIAGNOSIS — R569 Unspecified convulsions: Secondary | ICD-10-CM

## 2016-08-11 MED ORDER — LEVETIRACETAM 500 MG PO TABS: 1500.0000 mg | ORAL_TABLET | Freq: Two times a day (BID) | ORAL | 3 refills | Status: DC

## 2016-08-11 NOTE — Patient Instructions (Signed)
Increase Keppra to 1500 mg twice a day ( 3 tablets twice a day) Blood work today If you have any seizure events please let us know.

## 2016-08-11 NOTE — Progress Notes (Signed)
PATIENT: Cathy Gonzalez DOB: [birth year]  REASON FOR VISIT: follow up- seizures HISTORY FROM: patient  HISTORY OF PRESENT ILLNESS: Cathy Gonzalez is a 70 year old female with a history of nocturnal seizures. She returns today for follow-up. At the last visit Keppra was increased to 1000 mg in the morning and 1500 mg in the evening. She reports that she had a seizure last Thursday. She states that when she woke up she was she had bit the left side of her tongue. She states this is normally how she knew she had a seizure. She cannot tell me how many seizures she had in a week or months time. She is not using her CPAP regularly. She states that she needs new supplies. She has never had a seizure during the day. Her nocturnal seizures have not been witnessed. She lives at home by herself. Denies any significant changes with her mood or behavior. She states that she is having right hip pain and that has affected her gait and balance. Denies missing any medication. She states that increased stress is a trigger for her seizures. She returns today for an evaluation.   HISTORY 01/14/16:Cathy Gonzalez is a 70 year old right-handed black female with a history of nocturnal seizures. The patient has a history of hepatitis C, she has been on carbamazepine with good control of her seizures for number of years, but given the liver disease she has been transitioned to [place]. The patient also takes Lyrica for her peripheral neuropathy discomfort associated with diabetes. The patient is markedly overweight, and she has obstructive sleep apnea on CPAP. Recently, she has not had the parts to continue the CPAP, she has been off of this treatment. The patient has had several seizures that are always nocturnal in nature. The patient had a recent seizure within the last several days. The patient knows that she has had a seizure when she wakes up with a bitten tongue. The patient will be getting back on CPAP shortly. She has been placed on a  cholesterol medication that resulted in muscle cramps, and she went off the medication. The patient has had some degenerative arthritis affecting her right knee. More recently, the patient has had episodes of dizziness that generally occur with standing, and may be associated with near syncope. The patient reports that she has episodes of heart palpitations, racing of the heart that may be associated with the episodes of dizziness. The patient also has some mild chest discomfort at times. The patient returns to the office for an evaluation.  REVIEW OF SYSTEMS: Out of a complete 14 system review of symptoms, the patient complains only of the following symptoms, and all other reviewed systems are negative.  Blurred vision, leg swelling, apnea, snoring, joint pain, joint swelling, back pain, muscle cramps, seizure  ALLERGIES: No Known Allergies  HOME MEDICATIONS: Outpatient Medications Prior to Visit  Medication Sig Dispense Refill  . amLODipine (NORVASC) 10 MG tablet Take 10 mg by mouth daily.    [name] aspirin EC 81 MG tablet Take 1 tablet (81 mg total) by mouth daily. (Patient not taking: Reported on 04/03/2016) 30 tablet 3  . chlorhexidine (PERIDEX) 0.12 % solution   0  . colchicine 0.6 MG tablet Take 0.6 mg by mouth daily.     . diclofenac sodium (VOLTAREN) 1 % GEL Apply 1 application topically 2 (two) times daily. Reported on 04/03/2016  0  . HYDROcodone-acetaminophen (NORCO) 5-325 MG tablet Take 1 tablet by mouth every 6 (six) hours as needed for  moderate pain. (Patient not taking: Reported on 04/03/2016) 12 tablet 0  . levETIRAcetam (KEPPRA) 500 MG tablet TAKE TWO TABLETS BY MOUTH ONCE DAILY IN THE MORNING AND THREE ONCE DAILY IN THE EVENING 150 tablet 5  . metFORMIN (GLUCOPHAGE) 500 MG tablet Take 500 mg by mouth at bedtime.     . montelukast (SINGULAIR) 10 MG tablet Take 10 mg by mouth at bedtime.    . predniSONE (DELTASONE) 20 MG tablet One daily with food (Patient not taking: Reported on  04/03/2016) 7 tablet 0  . pregabalin (LYRICA) 300 MG capsule Take 1 capsule (300 mg total) by mouth 2 (two) times daily. 60 capsule 5  . sucralfate (CARAFATE) 1 G tablet Take 1 g by mouth 4 (four) times daily -  with meals and at bedtime.    [name] tiZANidine (ZANAFLEX) 4 MG tablet Take 4 mg by mouth at bedtime.     . valsartan (DIOVAN) 320 MG tablet Take 320 mg by mouth daily.     No facility-administered medications prior to visit.     PAST MEDICAL HISTORY: Past Medical History:  Diagnosis Date  . Arthritis   . Diabetes mellitus   . Diabetic neuropathy, type II diabetes mellitus ([place])   . Hepatitis C   . Hypertension   . Hypokalemia   . Morbidly obese ([place])   . Nocturnal seizures ([place]) 05/14/2015  . OSA on CPAP     PAST SURGICAL HISTORY: Past Surgical History:  Procedure Laterality Date  . ABDOMINAL HYSTERECTOMY    . CATARACT EXTRACTION Left   . COLONOSCOPY WITH PROPOFOL N/A 08/21/2015   Procedure: COLONOSCOPY WITH PROPOFOL;  Surgeon: Carol Ada, MD;  Location: [facility];  Service: Endoscopy;  Laterality: N/A;  . ESOPHAGOGASTRODUODENOSCOPY (EGD) WITH PROPOFOL N/A 08/21/2015   Procedure: ESOPHAGOGASTRODUODENOSCOPY (EGD) WITH PROPOFOL;  Surgeon: Carol Ada, MD;  Location: [facility];  Service: Endoscopy;  Laterality: N/A;    FAMILY HISTORY: Family History  Problem Relation Age of Onset  . Diabetes Sister   . Seizures Neg Hx   . Diabetes Brother   . Cancer Sister     breast    SOCIAL HISTORY: Social History   Social History  . Marital status: Single    Spouse name: N/A  . Number of children: 4  . Years of education: 12   Occupational History  . [name]    Social History Main Topics  . Smoking status: Never Smoker  . Smokeless tobacco: Never Used  . Alcohol use No  . Drug use: No  . Sexual activity: No   Other Topics Concern  . Not on file   Social History Narrative   Patient occasionally drinks caffeine.   Patient is right handed.       PHYSICAL EXAM  Vitals:   08/11/16 0801  BP: 127/78  Pulse: 90  Weight: 267 lb (121.1 kg)  Height: 5\' 3"  (1.6 m)   Body mass index is 47.3 kg/m.  Generalized: Well developed, in no acute distress, obese   Neurological examination  Mentation: Alert oriented to time, place, history taking. Follows all commands speech and language fluent Cranial nerve II-XII: Pupils were equal round reactive to light. Extraocular movements were full, visual field were full on confrontational test. Facial sensation and strength were normal. Uvula tongue midline. Head turning and shoulder shrug  were normal and symmetric. Motor: The motor testing reveals 5 over 5 strength of all 4 extremities. Good symmetric motor tone is noted throughout.  Sensory: Sensory testing is  intact to soft touch on all 4 extremities. No evidence of extinction is noted.  Coordination: Cerebellar testing reveals good finger-nose-finger and heel-to-shin bilaterally.  Gait and station: Patient has a slight limp on the right due to right hip pain. Tandem gait is unsteady. Romberg is negative. Reflexes: Deep tendon reflexes are symmetric and normal bilaterally.   DIAGNOSTIC DATA (LABS, IMAGING, TESTING) - I reviewed patient records, labs, notes, testing and imaging myself where available.  Lab Results  Component Value Date   WBC 3.8 (L) 07/12/2015   HGB 11.9 (L) 07/12/2015   HCT 35.0 (L) 07/12/2015   MCV 86.6 07/12/2015   PLT 237 07/12/2015      Component Value Date/Time   NA 140 07/12/2015 1026   K 4.0 07/12/2015 1026   CL 103 07/12/2015 1026   CO2 30 07/12/2015 1026   GLUCOSE 118 (H) 07/12/2015 1026   BUN 27 (H) 07/12/2015 1026   CREATININE 0.90 07/12/2015 1026   CALCIUM 9.7 07/12/2015 1026   PROT 7.3 07/12/2015 1026   ALBUMIN 3.8 07/12/2015 1026   AST 63 (H) 07/12/2015 1026   ALT 56 (H) 07/12/2015 1026   ALKPHOS 78 07/12/2015 1026   BILITOT 0.3 07/12/2015 1026   GFRNONAA 58 (L) 06/21/2014 1824   GFRAA 67  (L) 06/21/2014 1824   Lab Results  Component Value Date   CHOL 216 (H) 02/11/2014   HDL 65 02/11/2014   LDLCALC 133 (H) 02/11/2014   TRIG 92 02/11/2014   CHOLHDL 3.3 02/11/2014   Lab Results  Component Value Date   HGBA1C 7.2 (H) 02/11/2014   No results found for: VITAMINB12 No results found for: TSH    ASSESSMENT AND PLAN 70 y.o. year old female  has a past medical history of Arthritis; Diabetes mellitus; Diabetic neuropathy, type II diabetes mellitus ([place]); Hepatitis C; Hypertension; Hypokalemia; Morbidly obese ([place]); Nocturnal seizures ([place]) (05/14/2015); and OSA on CPAP. here with:  1. Nocturnal seizures  The patient had a seizure last Thursday. She is unable to tell me how many seizures she has been a week or month. I have encouraged the patient to keep a calendar with her seizure events. I will increase Keppra to 1500 mg twice a day. I will check blood work today. Patient is encouraged to use her CPAP machine. She voiced understanding. She will follow-up in 6 months or sooner if needed.     Ward Givens, MSN, NP-C 08/11/2016, 8:03 AM [facility] [address] [place], [place] [address] [phone]

## 2016-08-11 NOTE — Progress Notes (Signed)
I have read the note, and I agree with the clinical assessment and plan.  Sabine Tenenbaum KEITH   

## 2016-08-12 ENCOUNTER — Telehealth: Payer: Self-pay | Admitting: Adult Health

## 2016-08-12 NOTE — Telephone Encounter (Signed)
I spoke to patient and she is taking higher dose. She will call back if she continues to have seizures.

## 2016-08-12 NOTE — Telephone Encounter (Signed)
Patient is calling. She was seen in our office yesterday. After leaving our office, going home and taking a nap she woke up and had a seizure. Her tongue is so sore from being chewed on. Please call and discuss.

## 2016-08-12 NOTE — Telephone Encounter (Signed)
Keppra was increased at the office visit to 1500 mg BID. If the patient has any additional seizures while on the higher dose of keppra she should let us know.

## 2016-08-12 NOTE — Telephone Encounter (Signed)
LM for patient to call back if needed.  Cathy Gonzalez

## 2016-08-13 LAB — CBC WITH DIFFERENTIAL/PLATELET
Basophils Absolute: 0 10*3/uL (ref 0.0–0.2)
Basos: 0 %
EOS (ABSOLUTE): 0.1 10*3/uL (ref 0.0–0.4)
Eos: 2 %
Hematocrit: 35.9 % (ref 34.0–46.6)
Hemoglobin: 12.1 g/dL (ref 11.1–15.9)
Immature Grans (Abs): 0.1 10*3/uL (ref 0.0–0.1)
Immature Granulocytes: 1 %
Lymphocytes Absolute: 2 10*3/uL (ref 0.7–3.1)
Lymphs: 30 %
MCH: 29.2 pg (ref 26.6–33.0)
MCHC: 33.7 g/dL (ref 31.5–35.7)
MCV: 87 fL (ref 79–97)
Monocytes Absolute: 0.6 10*3/uL (ref 0.1–0.9)
Monocytes: 8 %
Neutrophils Absolute: 4 10*3/uL (ref 1.4–7.0)
Neutrophils: 59 %
Platelets: 283 10*3/uL (ref 150–379)
RBC: 4.15 x10E6/uL (ref 3.77–5.28)
RDW: 13.7 % (ref 12.3–15.4)
WBC: 6.7 10*3/uL (ref 3.4–10.8)

## 2016-08-13 LAB — COMPREHENSIVE METABOLIC PANEL
ALT: 36 IU/L — ABNORMAL HIGH (ref 0–32)
AST: 30 IU/L (ref 0–40)
Albumin/Globulin Ratio: 1.7 (ref 1.2–2.2)
Albumin: 4.5 g/dL (ref 3.6–4.8)
Alkaline Phosphatase: 77 IU/L (ref 39–117)
BUN/Creatinine Ratio: 21 (ref 12–28)
BUN: 23 mg/dL (ref 8–27)
Bilirubin Total: <0.2 mg/dL (ref 0.0–1.2)
CO2: 28 mmol/L (ref 18–29)
Calcium: 10 mg/dL (ref 8.7–10.3)
Chloride: 97 mmol/L (ref 96–106)
Creatinine, Ser: 1.12 mg/dL — ABNORMAL HIGH (ref 0.57–1.00)
GFR calc Af Amer: 58 mL/min/{1.73_m2} — ABNORMAL LOW (ref >59)
GFR calc non Af Amer: 51 mL/min/{1.73_m2} — ABNORMAL LOW (ref >59)
Globulin, Total: 2.7 g/dL (ref 1.5–4.5)
Glucose: 227 mg/dL — ABNORMAL HIGH (ref 65–99)
Potassium: 3.5 mmol/L (ref 3.5–5.2)
Sodium: 140 mmol/L (ref 134–144)
Total Protein: 7.2 g/dL (ref 6.0–8.5)

## 2016-08-13 LAB — LEVETIRACETAM LEVEL: Levetiracetam Lvl: 45.1 ug/mL — ABNORMAL HIGH (ref 10.0–40.0)

## 2016-08-14 ENCOUNTER — Telehealth: Payer: Self-pay | Admitting: Adult Health

## 2016-08-14 NOTE — Telephone Encounter (Addendum)
-----   Message from McGrath sent at 08/12/2016 10:48 AM EST ----- Regarding: med refill Contact: [phone] Pt req refill for Lyrica be called into [facility]. 08/14/2016-Left message informing pt I reviewed 08/07/2016 and dir. Hyatt had recommended she discuss with her PCP the change in dosing of the Lyrica and other medications. I told pt to get the refill from her PCP.

## 2016-08-14 NOTE — Telephone Encounter (Signed)
Mild kidney abnormality on lab work. Please make patient aware and send results to PCP.

## 2016-08-14 NOTE — Telephone Encounter (Signed)
See below, patient is asking for lab results.

## 2016-08-14 NOTE — Telephone Encounter (Signed)
Pt called for lab results. May call after 1p pt is working .

## 2016-08-14 NOTE — Telephone Encounter (Signed)
LM (per DPR) with results below.  

## 2016-08-18 DIAGNOSIS — E876 Hypokalemia: Secondary | ICD-10-CM | POA: Diagnosis not present

## 2016-08-18 DIAGNOSIS — J399 Disease of upper respiratory tract, unspecified: Secondary | ICD-10-CM | POA: Diagnosis not present

## 2016-08-18 DIAGNOSIS — Z79899 Other long term (current) drug therapy: Secondary | ICD-10-CM | POA: Diagnosis not present

## 2016-08-18 DIAGNOSIS — J00 Acute nasopharyngitis [common cold]: Secondary | ICD-10-CM | POA: Diagnosis not present

## 2016-08-19 NOTE — Telephone Encounter (Signed)
Pt called in asking for lab work to be faxed to pcp.

## 2016-08-19 NOTE — Telephone Encounter (Signed)
Received fax confirmation Dr.  [name].  [phone] (lab results).

## 2016-08-20 NOTE — Telephone Encounter (Signed)
I spoke to pt and relayed that lab results faxed to Dr. Criss Rosales.  Pt had been to see her Monday, and did strep test.  Pt to go back tomorrow for f/u.  Pt verbalized understanding.

## 2016-08-21 DIAGNOSIS — E876 Hypokalemia: Secondary | ICD-10-CM | POA: Diagnosis not present

## 2016-08-21 DIAGNOSIS — E119 Type 2 diabetes mellitus without complications: Secondary | ICD-10-CM | POA: Diagnosis not present

## 2016-08-21 DIAGNOSIS — I10 Essential (primary) hypertension: Secondary | ICD-10-CM | POA: Diagnosis not present

## 2016-09-02 ENCOUNTER — Ambulatory Visit: Payer: Medicare Other | Admitting: Podiatry

## 2016-09-11 ENCOUNTER — Ambulatory Visit: Payer: Medicare Other | Admitting: Podiatry

## 2016-09-23 ENCOUNTER — Ambulatory Visit: Payer: Medicare Other | Admitting: Podiatry

## 2016-09-25 ENCOUNTER — Other Ambulatory Visit: Payer: Self-pay | Admitting: *Deleted

## 2016-09-25 ENCOUNTER — Ambulatory Visit: Payer: Medicare Other | Admitting: Internal Medicine

## 2016-09-25 ENCOUNTER — Telehealth: Payer: Self-pay | Admitting: *Deleted

## 2016-09-25 MED ORDER — LEVETIRACETAM 500 MG PO TABS: 1500.0000 mg | ORAL_TABLET | Freq: Two times a day (BID) | ORAL | 3 refills | Status: DC

## 2016-09-25 NOTE — Telephone Encounter (Signed)
Received fax for [facility] on levetiracetam tabs.   Pt taking 1500mg  po bid (500mg  tabs).

## 2016-09-25 NOTE — Telephone Encounter (Signed)
I called Cathy Gonzalez, at [facility] and let her know that the keppra prescription changed to [facility] and they could cancel there prescription.  She did, pt has enough to get mail order.

## 2016-10-08 DIAGNOSIS — I10 Essential (primary) hypertension: Secondary | ICD-10-CM | POA: Diagnosis not present

## 2016-10-13 ENCOUNTER — Ambulatory Visit: Payer: Medicare Other | Admitting: Internal Medicine

## 2016-10-14 ENCOUNTER — Ambulatory Visit: Payer: Medicare Other | Admitting: Podiatry

## 2016-10-16 ENCOUNTER — Ambulatory Visit (INDEPENDENT_AMBULATORY_CARE_PROVIDER_SITE_OTHER): Payer: Medicare Other | Admitting: Podiatry

## 2016-10-16 ENCOUNTER — Encounter: Payer: Self-pay | Admitting: Podiatry

## 2016-10-16 DIAGNOSIS — E1142 Type 2 diabetes mellitus with diabetic polyneuropathy: Secondary | ICD-10-CM

## 2016-10-16 DIAGNOSIS — M79676 Pain in unspecified toe(s): Secondary | ICD-10-CM

## 2016-10-16 DIAGNOSIS — B351 Tinea unguium: Secondary | ICD-10-CM

## 2016-10-16 DIAGNOSIS — G5790 Unspecified mononeuropathy of unspecified lower limb: Secondary | ICD-10-CM | POA: Diagnosis not present

## 2016-10-16 NOTE — Progress Notes (Signed)
She presents today with chief complaint of painful elongated toenails. She is also complaining of polyneuropathy associated with diabetes. She also complaining of painful nerve bilateral.  Objective: Toenails are thick yellow dystrophic with mycotic. Pulses are strongly palpable bilateral. She has pain on palpation and neuritis.  Assessment: Painful neuritis bilateral foot. Pain in limb C Onychomycosis.  Plan: Debridement of toenails bilateral. And injected Kenalog and local anesthetic. to third interdigital space bilateral. Follow up with her in

## 2016-10-20 ENCOUNTER — Ambulatory Visit (INDEPENDENT_AMBULATORY_CARE_PROVIDER_SITE_OTHER): Payer: Medicare Other | Admitting: Internal Medicine

## 2016-10-20 ENCOUNTER — Encounter: Payer: Self-pay | Admitting: Internal Medicine

## 2016-10-20 VITALS — BP 138/82 | HR 82 | Temp 97.8°F | Ht 64.0 in | Wt 261.0 lb

## 2016-10-20 DIAGNOSIS — K7460 Unspecified cirrhosis of liver: Secondary | ICD-10-CM | POA: Diagnosis not present

## 2016-10-20 DIAGNOSIS — B182 Chronic viral hepatitis C: Secondary | ICD-10-CM | POA: Diagnosis not present

## 2016-10-20 LAB — COMPLETE METABOLIC PANEL WITH GFR
ALT: 32 U/L — ABNORMAL HIGH (ref 6–29)
AST: 24 U/L (ref 10–35)
Albumin: 4.6 g/dL (ref 3.6–5.1)
Alkaline Phosphatase: 45 U/L (ref 33–130)
BUN: 25 mg/dL (ref 7–25)
CO2: 33 mmol/L — ABNORMAL HIGH (ref 20–31)
Calcium: 10.5 mg/dL — ABNORMAL HIGH (ref 8.6–10.4)
Chloride: 98 mmol/L (ref 98–110)
Creat: 1.08 mg/dL — ABNORMAL HIGH (ref 0.50–0.99)
GFR, Est African American: 61 mL/min (ref >=60)
GFR, Est Non African American: 53 mL/min — ABNORMAL LOW (ref >=60)
Glucose, Bld: 190 mg/dL — ABNORMAL HIGH (ref 65–99)
Potassium: 3.3 mmol/L — ABNORMAL LOW (ref 3.5–5.3)
Sodium: 140 mmol/L (ref 135–146)
Total Bilirubin: 0.3 mg/dL (ref 0.2–1.2)
Total Protein: 7.5 g/dL (ref 6.1–8.1)

## 2016-10-20 NOTE — Assessment & Plan Note (Signed)
[place] screening now and in 6 months.  rtc 1 year and redo then

## 2016-10-20 NOTE — Progress Notes (Signed)
   Subjective:    Patient ID: Cathy Gonzalez, female    DOB: Sep 18, 1946, 71 y.o.   MRN: [id number]  HPI She comes in for follow up of cirrhosis.   She has genotype 1a, elastography with F3/4 and ultrasound with nodular hepatic contour concerning for cirrhosis.  Completed HCV treatment and SVR12 negative confirming cure. Does not drink alcohol.   Some purposeful weight loss.    Review of Systems  Constitutional: Negative for fatigue.  Gastrointestinal: Negative for diarrhea and nausea.  Skin: Negative for rash.  Neurological: Negative for dizziness and light-headedness.       Objective:   Physical Exam  Constitutional: She appears well-developed and well-nourished. No distress.  Eyes: No scleral icterus.  Cardiovascular: Normal rate, regular rhythm and normal heart sounds.   No murmur heard. Pulmonary/Chest: Effort normal and breath sounds normal. No respiratory distress.  Skin: No rash noted.   Social History   Social History  . Marital status: Single    Spouse name: N/A  . Number of children: 4  . Years of education: 12   Occupational History  . [name]    Social History Main Topics  . Smoking status: Never Smoker  . Smokeless tobacco: Never Used  . Alcohol use No  . Drug use: No  . Sexual activity: No   Other Topics Concern  . Not on file   Social History Narrative   Patient occasionally drinks caffeine.   Patient is right handed.         Assessment & Plan:

## 2016-10-20 NOTE — Assessment & Plan Note (Signed)
Will recheck viral load today for last time

## 2016-10-23 LAB — HEPATITIS C RNA QUANTITATIVE
HCV Quantitative Log: <1.18 Log IU/mL (ref <1.18)
HCV Quantitative: <15 IU/mL (ref <15)

## 2016-10-30 ENCOUNTER — Ambulatory Visit
Admission: RE | Admit: 2016-10-30 | Discharge: 2016-10-30 | Disposition: A | Discharge status: Home / Self Care | Payer: Medicare Other | Source: Ambulatory Visit | Attending: Internal Medicine | Admitting: Internal Medicine

## 2016-10-30 DIAGNOSIS — K7460 Unspecified cirrhosis of liver: Secondary | ICD-10-CM

## 2016-10-31 ENCOUNTER — Other Ambulatory Visit: Payer: Self-pay | Admitting: Adult Health

## 2016-11-04 ENCOUNTER — Other Ambulatory Visit: Payer: Self-pay | Admitting: Adult Health

## 2016-11-12 ENCOUNTER — Encounter: Payer: Self-pay | Admitting: Psychology

## 2016-11-19 DIAGNOSIS — I10 Essential (primary) hypertension: Secondary | ICD-10-CM | POA: Diagnosis not present

## 2016-11-19 DIAGNOSIS — E0840 Diabetes mellitus due to underlying condition with diabetic neuropathy, unspecified: Secondary | ICD-10-CM | POA: Diagnosis not present

## 2016-11-19 DIAGNOSIS — E119 Type 2 diabetes mellitus without complications: Secondary | ICD-10-CM | POA: Diagnosis not present

## 2016-11-19 DIAGNOSIS — Z0000 Encounter for general adult medical examination without abnormal findings: Secondary | ICD-10-CM | POA: Diagnosis not present

## 2016-11-19 DIAGNOSIS — E118 Type 2 diabetes mellitus with unspecified complications: Secondary | ICD-10-CM | POA: Diagnosis not present

## 2016-11-19 DIAGNOSIS — M62838 Other muscle spasm: Secondary | ICD-10-CM | POA: Diagnosis not present

## 2016-12-01 ENCOUNTER — Encounter: Payer: Medicare Other | Attending: Psychology | Admitting: Psychology

## 2016-12-01 ENCOUNTER — Encounter: Payer: Self-pay | Admitting: Psychology

## 2016-12-01 DIAGNOSIS — M1990 Unspecified osteoarthritis, unspecified site: Secondary | ICD-10-CM | POA: Insufficient documentation

## 2016-12-01 DIAGNOSIS — G4733 Obstructive sleep apnea (adult) (pediatric): Secondary | ICD-10-CM | POA: Diagnosis not present

## 2016-12-01 DIAGNOSIS — F4321 Adjustment disorder with depressed mood: Secondary | ICD-10-CM | POA: Insufficient documentation

## 2016-12-01 DIAGNOSIS — B1920 Unspecified viral hepatitis C without hepatic coma: Secondary | ICD-10-CM | POA: Diagnosis not present

## 2016-12-01 DIAGNOSIS — R413 Other amnesia: Secondary | ICD-10-CM | POA: Diagnosis not present

## 2016-12-01 DIAGNOSIS — I10 Essential (primary) hypertension: Secondary | ICD-10-CM | POA: Insufficient documentation

## 2016-12-01 DIAGNOSIS — E119 Type 2 diabetes mellitus without complications: Secondary | ICD-10-CM | POA: Insufficient documentation

## 2016-12-01 NOTE — Progress Notes (Signed)
Patient:   Cathy Gonzalez   DOB:   [birth year]  MR Number:  [id number]  Location:  [facility] [address], [place] [id number] [place] [address] Dept: [phone]           Date of Service:   12/01/2016  Start Time:   2 PM End Time:   3 PM  Provider/Observer:  Cathy Gonzalez       Billing Code/Service: [phone]  Chief Complaint:     Chief Complaint  Patient presents with  . Memory Loss  . Depression  . Stress    Reason for Service:  The patient was referred for neuropsychological evaluation regarding concerns over possible memory issues. The patient reports that she was diagnosed with a seizure disorder in the 1970s that the neurologist felt triggered by stress. She was treated for some time and her seizures stopped. There were a lot of coping issues at that time had to do with her children particularly one of her daughters. The patient was seizure-free for quite some time. She also had hepatitis C which has now recently been successfully treated. However, more recently the patient has had what appear to be seizures in her sleep. The evidence for this are severe bites on her tongue on the side that happen at night. She does not remember this happening and does not realize she has bitten her tongue until the next reading. She has been followed by Cathy Gonzalez who prescribed her Keppra.  The patient's children have been concerned about possible memory issues as the patient is described as repeating questions for them over and overlie she does not remember them. However, the patient reports that this isn't because she does not remember asking the questions but that she feels like they're not listening to her and she is trying to get a straight answer. There have been a lot of recent stressors have included with her children not helping her out or paying attention to her but this has  improved more recently with her son coming to take care of her.  Current Status:  The patient describes significant stress recently although it has had to do with her kids not helping her out when she is struggled. The patient reports that she does feel like it has improved somewhat recently. There've been some concerns about memory issues developing and there was request for neuropsychological evaluation. The patient denies any significant memory issues but there has been concerns from her treating physician and her family.  Reliability of Information: Information is provided by the patient as well as review of medical records.  Behavioral Observation: Cathy Gonzalez  presents as a 71 y.o.-year-old Right African American Female who appeared her stated age. her dress was Appropriate and she was Well Groomed and her manners were Appropriate to the situation.  her participation was indicative of Appropriate behaviors.  There were not any physical disabilities noted.  she displayed an appropriate level of cooperation and motivation.     Interactions:    Active Appropriate  Attention:   within normal limits and attention span and concentration were age appropriate  Memory:   within normal limits; recent and remote memory intact  Visuo-spatial:  within normal limits  Speech (Volume):  normal  Speech:   normal;   Thought Process:  Coherent  Though Content:  WNL; no suicidal or homicidal ideation.  Orientation:   person, place, time/date and situation  Judgment:   Good  Planning:   Good  Affect:    There were no apparent issues related to anxiety or depression although she did acknowledge a lot of stress recently and difficulty adjusting to struggles in her life and the fact that she felt like her children were not helping her out the way that she needed them to.  Mood:    Depressed  Insight:   Good  Intelligence:   normal  Marital Status/Living: Him the patient was born and raised in  [place]. Her parents are deceased. The patient is single. She does have more children. The patient reports that she raised her 4 children on her on no help from their father. When one of her oldest son's went into the [place] she ended up raising his 2 grandchildren as well. There was a time of great stress between the patient and her daughter.  Current Employment: The patient is not currently working.  Past Employment:    Substance Use:  No concerns of substance abuse are reported.      Medical History:   Past Medical History:  Diagnosis Date  . Arthritis   . Diabetes mellitus   . Diabetic neuropathy, type II diabetes mellitus ([facility])   . Hepatitis C   . Hypertension   . Hypokalemia   . Morbidly obese ([facility])   . Nocturnal seizures ([facility]) 05/14/2015  . OSA on CPAP         Outpatient Encounter Prescriptions as of 12/01/2016  Medication Sig  . amLODipine (NORVASC) 10 MG tablet Take 10 mg by mouth daily.  [name] aspirin EC 81 MG tablet Take 1 tablet (81 mg total) by mouth daily.  . colchicine 0.6 MG tablet Take 0.6 mg by mouth daily.   . diclofenac sodium (VOLTAREN) 1 % GEL Apply 1 application topically 2 (two) times daily. Reported on 04/03/2016  . levETIRAcetam (KEPPRA) 500 MG tablet Take 3 tablets (1,500 mg total) by mouth 2 (two) times daily.  . metFORMIN (GLUCOPHAGE) 500 MG tablet Take 500 mg by mouth at bedtime.   . montelukast (SINGULAIR) 10 MG tablet Take 10 mg by mouth at bedtime.  . pregabalin (LYRICA) 300 MG capsule Take 1 capsule (300 mg total) by mouth 2 (two) times daily.  . sucralfate (CARAFATE) 1 G tablet Take 1 g by mouth 4 (four) times daily -  with meals and at bedtime.  [name] tiZANidine (ZANAFLEX) 4 MG tablet Take 4 mg by mouth at bedtime.   . valsartan (DIOVAN) 320 MG tablet Take 320 mg by mouth daily.   No facility-administered encounter medications on file as of 12/01/2016.           Sexual History:   History  Sexual Activity  . Sexual activity:  No    Psychiatric History:  The patient denies any past psychiatric history. She did have a severe history of seizures when she was younger sometime in the 51s.  Family Med/Psych History:  Family History  Problem Relation Age of Onset  . Diabetes Sister   . Seizures Neg Hx   . Diabetes Brother   . Cancer Sister     breast    Risk of Suicide/Violence: virtually non-existent the patient denies any suicidal or homicidal ideation.  Impression/DX:  The patient presents as a generally of the 71 year old female who essentially came in reporting that she was unsure why she was referred here. The patient has recently gone through treatment for hepatitis C successfully. However, the patient reports  that she has developed what appear to be seizures when she is asleep. She had a history of seizures back in the 1970s. The patient reports that her kids told her doctor that she was having more memory issues which was the primary reason for this referral. She also has had issues with regard to stress and difficulties around stress. A neuropsychological evaluation was requested.  Disposition/Plan:  We will set the patient up for formal neuropsychological testing utilizing the Weschler adult intelligence scale and the Weschler memory scale for an initial baseline cognitive functioning.  Diagnosis:    Memory loss  Adjustment disorder with depressed mood         Electronically Signed   _______________________ Ilean Skill, Psy.D.

## 2016-12-11 ENCOUNTER — Encounter (INDEPENDENT_AMBULATORY_CARE_PROVIDER_SITE_OTHER): Payer: Self-pay | Admitting: Orthopaedic Surgery

## 2016-12-11 ENCOUNTER — Ambulatory Visit (INDEPENDENT_AMBULATORY_CARE_PROVIDER_SITE_OTHER): Payer: Medicare Other

## 2016-12-11 ENCOUNTER — Ambulatory Visit (INDEPENDENT_AMBULATORY_CARE_PROVIDER_SITE_OTHER): Payer: Medicare Other | Admitting: Orthopaedic Surgery

## 2016-12-11 VITALS — Ht 65.0 in | Wt 261.0 lb

## 2016-12-11 DIAGNOSIS — G8929 Other chronic pain: Secondary | ICD-10-CM

## 2016-12-11 DIAGNOSIS — M5441 Lumbago with sciatica, right side: Secondary | ICD-10-CM

## 2016-12-11 DIAGNOSIS — M1711 Unilateral primary osteoarthritis, right knee: Secondary | ICD-10-CM

## 2016-12-11 DIAGNOSIS — M25561 Pain in right knee: Secondary | ICD-10-CM

## 2016-12-11 DIAGNOSIS — M5416 Radiculopathy, lumbar region: Secondary | ICD-10-CM

## 2016-12-11 DIAGNOSIS — M25551 Pain in right hip: Secondary | ICD-10-CM

## 2016-12-11 NOTE — Progress Notes (Signed)
Office Visit Note   Patient: Cathy Gonzalez           Date of Birth: 10/08/1945           MRN: [id number] Visit Date: 12/11/2016              Requested by: [name], MD [address] [place], [place] [address] PCP: [name], MD   Assessment & Plan: Visit Diagnoses:  1. Chronic bilateral low back pain with right-sided sciatica   2. Chronic pain of right knee   3. Pain of right hip joint   4. Unilateral primary osteoarthritis, right knee   5. Lumbar radiculopathy     Plan:  #1: MRI scan of the lumbar spine to rule out worsening spinal stenosis #2: We will send her to [facility] for a right knee brace and lumbar support as per her request #3 suggested water aerobics and walking but was not interested #4: Weight loss #5: I spent approximately 40 minutes between examining her and her discussion of her problems and treatment plans.   Follow-Up Instructions: Return in about 3 weeks (around 01/01/2017) for review of mri.   Orders:  Orders Placed This Encounter  Procedures  . XR Lumbar Spine 2-3 Views  . XR HIP UNILAT W OR W/O PELVIS 2-3 VIEWS RIGHT  . XR Knee Complete 4 Views Right  . MR Lumbar Spine w/o contrast   No orders of the defined types were placed in this encounter.     Procedures: No procedures performed   Clinical Data: No additional findings.   Subjective: Chief Complaint  Patient presents with  . Lower Back - Pain, Weakness  . Right Knee - Pain  . Right Hip - Pain    Every is a 71 year old African-American female who is seen today for multiple falls. She's been seen previously by Dr. Marlou Sa for for low back pain as well as other problems. She was not that he had seen her before. In today though complaining of low back pain with radiating to the right hip and down into the right knee but no further than that. She does have bilateral neuropathy by history. Pain in the feet. She is has exogenous obesity and is prediabetic according to her. She denies  any recent history of injury or trauma. She complains of some right hip pain but this is mainly posterior buttock and into the lateral lateral aspect down into the knee area. Her right knee though has been arthritic and been noted before. She has problems with her stairs which is worsens going up and down the stairs. She does have sleep apnea. Treated  for neuropathy by her PCP.       Review of Systems  Constitutional: Negative.   HENT: Negative.   Respiratory:       History of sleep apnea  Cardiovascular: Negative.   Gastrointestinal: Negative.   Endocrine:       History of prediabetes  Genitourinary: Negative.   Skin: Negative.   Neurological:       History of polyneuropathy  Hematological: Negative.   Psychiatric/Behavioral: Negative.      Objective: Vital Signs: Ht 5\' 5"  (1.651 m)   Wt 261 lb (118.4 kg)   BMI 43.43 kg/m   Physical Exam  Constitutional: She is oriented to person, place, and time. She appears well-developed and well-nourished.  HENT:  Head: Normocephalic and atraumatic.  Eyes: EOM are normal. Pupils are equal, round, and reactive to light.  Pulmonary/Chest:  Effort normal.  Neurological: She is alert and oriented to person, place, and time. She displays abnormal reflex. A sensory deficit (IN THE FEET) is present.  Skin: Skin is warm and dry.  Psychiatric: She has a normal mood and affect. Her behavior is normal. Judgment and thought content normal.    Right Knee Exam   Tenderness  The patient is experiencing tenderness in the lateral joint line and medial joint line.  Range of Motion  Extension: 5  Flexion: 100   Other  Sensation: decreased Pulse: absent   Right Hip Exam   Tenderness  The patient is experiencing no tenderness.     Range of Motion  Flexion: 100  Internal Rotation: 20  External Rotation: 20   Muscle Strength  Abduction: 4/5  Adduction: 4/5  Flexion: 4/5   Other  Sensation: decreased   Back Exam   Range of  Motion  Extension: 10  Flexion: 40  Lateral Bend Right: abnormal  Lateral Bend Left: abnormal  Rotation Right: abnormal  Rotation Left: abnormal   Muscle Strength  Right Quadriceps:  4/5  Left Quadriceps:  4/5  Right Hamstrings:  4/5  Left Hamstrings:  4/5   Tests  Straight leg raise right: negative Straight leg raise left: negative  Reflexes  Patellar:  0/4 abnormal Achilles:  0/4 abnormal      Specialty Comments:  No specialty comments available.  Imaging: Xr Hip Unilat W Or W/o Pelvis 2-3 Views Right  Result Date: 12/11/2016 AP pelvis reveals narrowing of the right hip with the superior acetabular spurring. I believe she has some cystic changes in the femoral head noted. Certainly the medial femoral acetabular joint is narrowed also.  Xr Knee Complete 4 Views Right  Result Date: 12/11/2016 4 view x-rays of the right knee reveals some degenerative changes about the medial lateral compartment. However her patellofemoral joint is certainly significantly reactive and narrowed at lateral patellofemoral joint. There is periarticular spurs both medially and laterally off the femur and patella.. There is some calcification over the patella anteriorly at the attachment of the quadriceps. She appears spurring noted on the patella.  she does have calcification posteriorly may be consistent with loose bodies or calcification in the Baker's cyst.  Xr Lumbar Spine 2-3 Views  Result Date: 12/11/2016 Three-view x-ray of the lumbar spine reveals degenerative disc disease diffusely L2-3-4-4-5 and S1. She has degenerative changes in the SI joint and laterally. Batwing deformity right greater than left with degenerative changes at 5 S1 at the batwing deformity.. She is an spelled anterior spurring on L2-3-4 5 and S1 degenerative disc disease with the disc space narrowing L3-4-4 5. He also has some at L2-3. Posterior spurs are noted she from L2 through the sacrum. In the foramen now that she  L3-4    PMFS History: Patient Active Problem List   Diagnosis Date Noted  . Dizziness and giddiness 01/14/2016  . Hepatic cirrhosis ([facility]) 07/26/2015  . Nocturnal seizures ([facility]) 05/14/2015  . Chronic hepatitis C without hepatic coma ([facility]) 03/14/2015  . Vertigo, central 02/11/2014  . Difficulty walking 02/11/2014  . Diabetes mellitus ([facility]) 02/11/2014  . Dysphagia 02/11/2014  . OBESITY 07/26/2010  . Essential hypertension, benign 07/26/2010  . DEGENERATIVE JOINT DISEASE, GENERALIZED 07/26/2010   Past Medical History:  Diagnosis Date  . Arthritis   . Diabetes mellitus   . Diabetic neuropathy, type II diabetes mellitus ([facility])   . Hepatitis C   . Hypertension   . Hypokalemia   . Morbidly  obese ([facility])   . Nocturnal seizures ([facility]) 05/14/2015  . OSA on CPAP     Family History  Problem Relation Age of Onset  . Diabetes Sister   . Seizures Neg Hx   . Diabetes Brother   . Cancer Sister     breast    Past Surgical History:  Procedure Laterality Date  . ABDOMINAL HYSTERECTOMY    . CATARACT EXTRACTION Left   . COLONOSCOPY WITH PROPOFOL N/A 08/21/2015   Procedure: COLONOSCOPY WITH PROPOFOL;  Surgeon: Carol Ada, MD;  [facility];  Service: Endoscopy;  Laterality: N/A;  . ESOPHAGOGASTRODUODENOSCOPY (EGD) WITH PROPOFOL N/A 08/21/2015   Procedure: ESOPHAGOGASTRODUODENOSCOPY (EGD) WITH PROPOFOL;  Surgeon: Carol Ada, MD;  [facility];  Service: Endoscopy;  Laterality: N/A;   Social History   Occupational History  . [name]    Social History Main Topics  . Smoking status: Never Smoker  . Smokeless tobacco: Never Used  . Alcohol use No  . Drug use: No  . Sexual activity: No

## 2016-12-12 ENCOUNTER — Ambulatory Visit (INDEPENDENT_AMBULATORY_CARE_PROVIDER_SITE_OTHER): Payer: Medicare Other | Admitting: Orthopaedic Surgery

## 2016-12-15 ENCOUNTER — Telehealth: Payer: Self-pay

## 2016-12-15 NOTE — Telephone Encounter (Signed)
Received faxed request from local [facility]. Insurance will only cover 15 days at a time so requesting a 30 or 90 day supply be sent to mail order ([facility]).

## 2016-12-16 ENCOUNTER — Other Ambulatory Visit: Payer: Self-pay | Admitting: Family Medicine

## 2016-12-16 DIAGNOSIS — Z1231 Encounter for screening mammogram for malignant neoplasm of breast: Secondary | ICD-10-CM

## 2016-12-16 NOTE — Telephone Encounter (Signed)
Can you verify what she is taking lyrica for? If its for seizures then we will refill it.

## 2016-12-16 NOTE — Telephone Encounter (Signed)
Called and spoke to pt. She stated fax sent for refill on lyrica to our office error. She is receiving refills from Dr Criss Rosales (PCP). Only getting her seizure medication from our office. Advised we will disregard request. She verbalized understanding.

## 2016-12-19 ENCOUNTER — Encounter: Payer: Medicare Other | Admitting: Psychology

## 2016-12-19 MED ORDER — PREGABALIN 300 MG PO CAPS: 300.0000 mg | ORAL_CAPSULE | Freq: Two times a day (BID) | ORAL | 5 refills | Status: DC

## 2016-12-28 ENCOUNTER — Ambulatory Visit
Admission: RE | Admit: 2016-12-28 | Discharge: 2016-12-28 | Disposition: A | Discharge status: Home / Self Care | Payer: Medicare Other | Source: Ambulatory Visit | Attending: Orthopedic Surgery | Admitting: Orthopedic Surgery

## 2016-12-28 DIAGNOSIS — M5441 Lumbago with sciatica, right side: Principal | ICD-10-CM

## 2016-12-28 DIAGNOSIS — G8929 Other chronic pain: Secondary | ICD-10-CM

## 2016-12-28 DIAGNOSIS — M48061 Spinal stenosis, lumbar region without neurogenic claudication: Secondary | ICD-10-CM | POA: Diagnosis not present

## 2016-12-29 ENCOUNTER — Encounter (INDEPENDENT_AMBULATORY_CARE_PROVIDER_SITE_OTHER): Payer: Self-pay | Admitting: Orthopaedic Surgery

## 2016-12-29 ENCOUNTER — Ambulatory Visit (INDEPENDENT_AMBULATORY_CARE_PROVIDER_SITE_OTHER): Payer: Medicare Other | Admitting: Orthopaedic Surgery

## 2016-12-29 VITALS — BP 139/79 | HR 88 | Ht 68.0 in | Wt 261.0 lb

## 2016-12-29 DIAGNOSIS — M5441 Lumbago with sciatica, right side: Secondary | ICD-10-CM | POA: Diagnosis not present

## 2016-12-29 DIAGNOSIS — J060 Acute laryngopharyngitis: Secondary | ICD-10-CM | POA: Diagnosis not present

## 2016-12-29 DIAGNOSIS — G8929 Other chronic pain: Secondary | ICD-10-CM | POA: Diagnosis not present

## 2016-12-29 DIAGNOSIS — J069 Acute upper respiratory infection, unspecified: Secondary | ICD-10-CM | POA: Diagnosis not present

## 2016-12-29 NOTE — Progress Notes (Signed)
Office Visit Note   Patient: Cathy Gonzalez           Date of Birth: 1946-01-30           MRN: [id number] Visit Date: 12/29/2016              Requested by: Cathy Lei, MD [address] [place], [place] [address] PCP: Cathy Peers, MD   Assessment & Plan: Visit Diagnoses chronic low back pain with right lower extremity radiculopathy. MRI scan demonstrates advanced multilevel spondylosis most pronounced from L2-S1. Combination of disc herniations and posterior element hypertrophy results in potentially symptomatic neural impingement at L2-3, L3-4, L4-5 and L5-S1. There is foraminal disc material at L3-4 that extends into the extraforaminal compartment on the right:   Plan: Long discussion  regarding results of the MRI scan in comparison to the study in 2012 . I would suggest an evaluation by Cathy Gonzalez and consider epidural steroid injection.  Follow-Up Instructions: No Follow-up on file.   Orders:  No orders of the defined types were placed in this encounter.  No orders of the defined types were placed in this encounter.     Procedures: No procedures performed   Clinical Data: No additional findings.   Subjective: No chief complaint on file.   Cathy Gonzalez is a 71 year old African-American female who is seen today MRI results. She's been seen previously by Cathy Gonzalez for for low back pain as well as other problems.  In today though complaining of low back pain with radiating to the right hip and down into the right knee but no further than that. She does have bilateral neuropathy by history. Pain in the feet. She is has exogenous obesity and is prediabetic according to her. She denies any recent history of injury or trauma. She complains of some right hip pain but this is mainly posterior buttock and into the lateral lateral aspect down into the knee area. Her right knee though has been arthritic and been noted before. She has problems with her stairs which is worsens going up and down  the stairs. She does have sleep apnea. CPAP machine she does not use) Treated  for neuropathy by her PCP.   Cathy Gonzalez has had a chronic problem with her back associated with right lower extremity pneumatic radiculopathy. She's had an MRI scan with a report as above. She is experiencing more back than leg pain but does have trouble when she stands for any length of time or walks for any length of time. Multiple comorbidities. denies bowel or bladder dysfunction presently.  Review of Systems   Objective: Vital Signs: There were no vitals taken for this visit.  Physical Exam  Ortho Exam morbidly obese. Straight leg raise negative bilaterally. Painless range of motion both hips and both knees. Some mild lower extremity nonpitting edema. Altered sensibility in both feet related to the neuropathy. Patient relates being prediabetic. No percussible tenderness of lumbar spine.  Specialty Comments:  No specialty comments available.  Imaging: No results found.   PMFS History: Patient Active Problem List   Diagnosis Date Noted  . Dizziness and giddiness 01/14/2016  . Hepatic cirrhosis ([facility]) 07/26/2015  . Nocturnal seizures ([facility]) 05/14/2015  . Chronic hepatitis C without hepatic coma ([facility]) 03/14/2015  . Vertigo, central 02/11/2014  . Difficulty walking 02/11/2014  . Diabetes mellitus ([facility]) 02/11/2014  . Dysphagia 02/11/2014  . OBESITY 07/26/2010  . Essential hypertension, benign 07/26/2010  . DEGENERATIVE JOINT DISEASE, GENERALIZED 07/26/2010   Past  Medical History:  Diagnosis Date  . Arthritis   . Diabetes mellitus   . Diabetic neuropathy, type II diabetes mellitus ([facility])   . Hepatitis C   . Hypertension   . Hypokalemia   . Morbidly obese ([facility])   . Nocturnal seizures ([facility]) 05/14/2015  . OSA on CPAP     Family History  Problem Relation Age of Onset  . Diabetes Sister   . Seizures Neg Hx   . Diabetes Brother   . Cancer Sister     breast    Past Surgical History:  Procedure  Laterality Date  . ABDOMINAL HYSTERECTOMY    . CATARACT EXTRACTION Left   . COLONOSCOPY WITH PROPOFOL N/A 08/21/2015   Procedure: COLONOSCOPY WITH PROPOFOL;  Surgeon: Cathy Ada, MD;  [facility];  Service: Endoscopy;  Laterality: N/A;  . ESOPHAGOGASTRODUODENOSCOPY (EGD) WITH PROPOFOL N/A 08/21/2015   Procedure: ESOPHAGOGASTRODUODENOSCOPY (EGD) WITH PROPOFOL;  Surgeon: Cathy Ada, MD;  [facility];  Service: Endoscopy;  Laterality: N/A;   Social History   Occupational History  . Cathy Gonzalez    Social History Main Topics  . Smoking status: Never Smoker  . Smokeless tobacco: Never Used  . Alcohol use No  . Drug use: No  . Sexual activity: No

## 2017-01-01 ENCOUNTER — Telehealth (INDEPENDENT_AMBULATORY_CARE_PROVIDER_SITE_OTHER): Payer: Self-pay | Admitting: Orthopaedic Surgery

## 2017-01-01 ENCOUNTER — Other Ambulatory Visit (INDEPENDENT_AMBULATORY_CARE_PROVIDER_SITE_OTHER): Payer: Self-pay

## 2017-01-01 DIAGNOSIS — M5441 Lumbago with sciatica, right side: Principal | ICD-10-CM

## 2017-01-01 DIAGNOSIS — G8929 Other chronic pain: Secondary | ICD-10-CM

## 2017-01-01 NOTE — Telephone Encounter (Signed)
Put the order in

## 2017-01-06 ENCOUNTER — Telehealth (INDEPENDENT_AMBULATORY_CARE_PROVIDER_SITE_OTHER): Payer: Self-pay | Admitting: Orthopaedic Surgery

## 2017-01-06 ENCOUNTER — Other Ambulatory Visit (INDEPENDENT_AMBULATORY_CARE_PROVIDER_SITE_OTHER): Payer: Self-pay

## 2017-01-06 ENCOUNTER — Encounter: Payer: Medicare Other | Admitting: Psychology

## 2017-01-06 DIAGNOSIS — M5441 Lumbago with sciatica, right side: Principal | ICD-10-CM

## 2017-01-06 DIAGNOSIS — G8929 Other chronic pain: Secondary | ICD-10-CM

## 2017-01-06 NOTE — Telephone Encounter (Signed)
Will put this in again today

## 2017-01-06 NOTE — Telephone Encounter (Signed)
Patient is wanting to know if the order has been sent to Dr. Romona Curls office for an injection. She was told by his clinic that Dr. Durward Fortes had not sent the order. Please advise.

## 2017-01-12 DIAGNOSIS — M1711 Unilateral primary osteoarthritis, right knee: Secondary | ICD-10-CM | POA: Diagnosis not present

## 2017-01-15 ENCOUNTER — Ambulatory Visit (INDEPENDENT_AMBULATORY_CARE_PROVIDER_SITE_OTHER): Payer: Medicare Other | Admitting: Podiatry

## 2017-01-15 ENCOUNTER — Encounter: Payer: Self-pay | Admitting: Podiatry

## 2017-01-15 DIAGNOSIS — D3613 Benign neoplasm of peripheral nerves and autonomic nervous system of lower limb, including hip: Secondary | ICD-10-CM

## 2017-01-15 DIAGNOSIS — G5790 Unspecified mononeuropathy of unspecified lower limb: Secondary | ICD-10-CM | POA: Diagnosis not present

## 2017-01-15 DIAGNOSIS — E1149 Type 2 diabetes mellitus with other diabetic neurological complication: Secondary | ICD-10-CM

## 2017-01-15 DIAGNOSIS — E1140 Type 2 diabetes mellitus with diabetic neuropathy, unspecified: Secondary | ICD-10-CM | POA: Diagnosis not present

## 2017-01-18 NOTE — Progress Notes (Signed)
She presents today for chief complaint of neuritis third interdigital space bilateral states that is burning a lot right under my toes.  Objective: Pulses are palpable palpable Mulder's click third interdigital space bilateral foot.  Assessment: Neuroma third interdigital space bilateral foot made worse with neuropathy.  Plan: Injected the bilateral third and interspaces today with dehydrated alcohol

## 2017-01-19 ENCOUNTER — Ambulatory Visit (INDEPENDENT_AMBULATORY_CARE_PROVIDER_SITE_OTHER): Payer: Self-pay

## 2017-01-19 ENCOUNTER — Ambulatory Visit (INDEPENDENT_AMBULATORY_CARE_PROVIDER_SITE_OTHER): Payer: Medicare Other | Admitting: Physical Medicine and Rehabilitation

## 2017-01-19 ENCOUNTER — Encounter (INDEPENDENT_AMBULATORY_CARE_PROVIDER_SITE_OTHER): Payer: Self-pay | Admitting: Physical Medicine and Rehabilitation

## 2017-01-19 VITALS — BP 103/68 | HR 82 | Temp 98.0°F

## 2017-01-19 DIAGNOSIS — M5116 Intervertebral disc disorders with radiculopathy, lumbar region: Secondary | ICD-10-CM

## 2017-01-19 DIAGNOSIS — M5416 Radiculopathy, lumbar region: Secondary | ICD-10-CM | POA: Diagnosis not present

## 2017-01-19 MED ORDER — LIDOCAINE HCL (PF) 1 % IJ SOLN
0.3300 mL | Freq: Once | INTRAMUSCULAR | Status: AC
  Administered 2017-01-19: 0.3 mL

## 2017-01-19 MED ORDER — METHYLPREDNISOLONE ACETATE 80 MG/ML IJ SUSP
80.0000 mg | Freq: Once | INTRAMUSCULAR | Status: AC
  Administered 2017-01-19: 80 mg

## 2017-01-19 NOTE — Procedures (Signed)
Lumbar Epidural Steroid Injection - Interlaminar Approach with Fluoroscopic Guidance  Patient: Cathy Gonzalez      Date of Birth: September 19, 1946 MRN: [id number] PCP: [name], MD      Visit Date: 01/19/2017   Universal Protocol:    Date/Time: 04/16/189:20 PM  Consent Given By: the patient  Position: PRONE  Additional Comments: Vital signs were monitored before and after the procedure. Patient was prepped and draped in the usual sterile fashion. The correct patient, procedure, and site was verified.   Injection Procedure Details:  Procedure Site One Meds Administered:  Meds ordered this encounter  Medications  . lidocaine (PF) (XYLOCAINE) 1 % injection 0.3 mL  . methylPREDNISolone acetate (DEPO-MEDROL) injection 80 mg     Laterality: Right  Location/Site:  L5-S1  Needle size: 20 G  Needle type: Tuohy  Needle Placement: Paramedian epidural  Findings:  -Contrast Used: 1 mL iohexol 180 mg iodine/mL   -Comments: Excellent flow of contrast into the epidural space.  Procedure Details: Using a paramedian approach from the side mentioned above, the region overlying the inferior lamina was localized under fluoroscopic visualization and the soft tissues overlying this structure were infiltrated with 4 ml. of 1% Lidocaine without Epinephrine. The Tuohy needle was inserted into the epidural space using a paramedian approach.   The epidural space was localized using loss of resistance along with lateral and bi-planar fluoroscopic views.  After negative aspirate for air, blood, and CSF, a 2 ml. volume of Isovue-250 was injected into the epidural space and the flow of contrast was observed. Radiographs were obtained for documentation purposes.    The injectate was administered into the level noted above.   Additional Comments:  The patient tolerated the procedure well Dressing: Band-Aid    Post-procedure details: Patient was observed during the procedure. Post-procedure  instructions were reviewed.  Patient left the clinic in stable condition.

## 2017-01-19 NOTE — Patient Instructions (Signed)

## 2017-01-19 NOTE — Progress Notes (Signed)
Cathy Gonzalez - 71 y.o. female MRN [id number]  Date of birth: [birth year]  Office Visit Note: Visit Date: 01/19/2017 PCP: [name], MD Referred by: [name], MD  Subjective: Chief Complaint  Patient presents with  . Lower Back - Pain   HPI: Cathy Gonzalez is a pleasant 71 year old female complaining of chronic worsening severe low back pain into the right and left buttock and radiating to the right hip and down into the right knee. Numbness and tingling down leg to foot. Pain worse with walking and sitting. Relief with sitting. She has multifactorial stenosis at L4-5 she also has central disc extrusion at L5-S1. She has failed conservative care has had epidural injections in the past. We are going to complete a diagnostic and therapeutic right L5-S1 intralaminar epidural steroid injection. Depending on the relief she gets we could look at transforaminal approach.    ROS Otherwise per HPI.  Assessment & Plan: Visit Diagnoses:  1. Lumbar radiculopathy   2. Radiculopathy due to lumbar intervertebral disc disorder     Plan: Findings:  Right L5-S1 intralaminar epidural steroid injection. Depending on relief would look a transforaminal approach. Unfortunately she may be a surgical candidate.    Meds & Orders:  Meds ordered this encounter  Medications  . lidocaine (PF) (XYLOCAINE) 1 % injection 0.3 mL  . methylPREDNISolone acetate (DEPO-MEDROL) injection 80 mg    Orders Placed This Encounter  Procedures  . XR C-ARM NO REPORT  . Epidural Steroid injection    Follow-up: Return if symptoms worsen or fail to improve, for Dr. Durward Fortes.   Procedures: No procedures performed  Lumbar Epidural Steroid Injection - Interlaminar Approach with Fluoroscopic Guidance  Patient: Cathy Gonzalez      Date of Birth: 21-Nov-1945 MRN: [id number] PCP: [name], MD      Visit Date: 01/19/2017   Universal Protocol:    Date/Time: 04/16/189:20 PM  Consent Given By: the patient  Position:  PRONE  Additional Comments: Vital signs were monitored before and after the procedure. Patient was prepped and draped in the usual sterile fashion. The correct patient, procedure, and site was verified.   Injection Procedure Details:  Procedure Site One Meds Administered:  Meds ordered this encounter  Medications  . lidocaine (PF) (XYLOCAINE) 1 % injection 0.3 mL  . methylPREDNISolone acetate (DEPO-MEDROL) injection 80 mg     Laterality: Right  Location/Site:  L5-S1  Needle size: 20 G  Needle type: Tuohy  Needle Placement: Paramedian epidural  Findings:  -Contrast Used: 1 mL iohexol 180 mg iodine/mL   -Comments: Excellent flow of contrast into the epidural space.  Procedure Details: Using a paramedian approach from the side mentioned above, the region overlying the inferior lamina was localized under fluoroscopic visualization and the soft tissues overlying this structure were infiltrated with 4 ml. of 1% Lidocaine without Epinephrine. The Tuohy needle was inserted into the epidural space using a paramedian approach.   The epidural space was localized using loss of resistance along with lateral and bi-planar fluoroscopic views.  After negative aspirate for air, blood, and CSF, a 2 ml. volume of Isovue-250 was injected into the epidural space and the flow of contrast was observed. Radiographs were obtained for documentation purposes.    The injectate was administered into the level noted above.   Additional Comments:  The patient tolerated the procedure well Dressing: Band-Aid    Post-procedure details: Patient was observed during the procedure. Post-procedure instructions were reviewed.  Patient left the clinic in stable condition.  Clinical History: Lumbar spine MRI  12/28/2016  L3-L4: Central and rightward protrusion. Foraminal disc material extends into the extraforaminal compartment on the RIGHT. Posterior element hypertrophy. Moderate stenosis. RIGHT L3  and BILATERAL L4 nerve root impingement.   L4-L5: Central protrusion. Advanced posterior element hypertrophy. Moderate to severe stenosis. Foraminal and extraforaminal disc material to the LEFT. LEFT greater than RIGHT L4 and L5 nerve root impingement.   L5-S1: Central extrusion. Posterior element hypertrophy. Disc material extends into both foramina. Mild stenosis. BILATERAL L5 and S1 nerve root impingement.   Compared with 2012, there is worsening at all levels except L1-2, which appears improved since that time.   IMPRESSION: Advanced multilevel spondylosis, most pronounced from L2 through S1. Combination of disc herniations and posterior element hypertrophy results in potentially symptomatic neural impingement at L2-3, L3-4, L4-5, and L5-S1.    She reports that she has never smoked. She has never used smokeless tobacco. No results for input(s): HGBA1C, LABURIC in the last 8760 hours.  Objective:  VS:  HT:    WT:   BMI:     BP:103/68  HR:82bpm  TEMP:98 F (36.7 C)(Oral)  RESP:95 % Physical Exam  Musculoskeletal:  Patient ambulates without aid she is slow to rise from seated position. She has good distal strength.    Ortho Exam Imaging: Xr C-arm No Report  Result Date: 01/19/2017 Please see Notes or Procedures tab for imaging impression.   Past Medical/Family/Surgical/Social History: Medications & Allergies reviewed per EMR Patient Active Problem List   Diagnosis Date Noted  . Dizziness and giddiness 01/14/2016  . Hepatic cirrhosis ([place]) 07/26/2015  . Nocturnal seizures ([place]) 05/14/2015  . Chronic hepatitis C without hepatic coma ([place]) 03/14/2015  . Vertigo, central 02/11/2014  . Difficulty walking 02/11/2014  . Diabetes mellitus ([place]) 02/11/2014  . Dysphagia 02/11/2014  . OBESITY 07/26/2010  . Essential hypertension, benign 07/26/2010  . DEGENERATIVE JOINT DISEASE, GENERALIZED 07/26/2010   Past Medical History:  Diagnosis Date  . Arthritis   . Diabetes  mellitus   . Diabetic neuropathy, type II diabetes mellitus ([place])   . Hepatitis C   . Hypertension   . Hypokalemia   . Morbidly obese ([place])   . Nocturnal seizures ([place]) 05/14/2015  . OSA on CPAP    Family History  Problem Relation Age of Onset  . Diabetes Sister   . Seizures Neg Hx   . Diabetes Brother   . Cancer Sister     breast   Past Surgical History:  Procedure Laterality Date  . ABDOMINAL HYSTERECTOMY    . CATARACT EXTRACTION Left   . COLONOSCOPY WITH PROPOFOL N/A 08/21/2015   Procedure: COLONOSCOPY WITH PROPOFOL;  Surgeon: Carol Ada, MD;  Location: [facility];  Service: Endoscopy;  Laterality: N/A;  . ESOPHAGOGASTRODUODENOSCOPY (EGD) WITH PROPOFOL N/A 08/21/2015   Procedure: ESOPHAGOGASTRODUODENOSCOPY (EGD) WITH PROPOFOL;  Surgeon: Carol Ada, MD;  Location: [facility];  Service: Endoscopy;  Laterality: N/A;   Social History   Occupational History  . [name]    Social History Main Topics  . Smoking status: Never Smoker  . Smokeless tobacco: Never Used  . Alcohol use No  . Drug use: No  . Sexual activity: No

## 2017-01-22 ENCOUNTER — Encounter: Payer: Self-pay | Admitting: Psychology

## 2017-01-22 ENCOUNTER — Encounter: Payer: Medicare Other | Attending: Psychology | Admitting: Psychology

## 2017-01-22 DIAGNOSIS — F4321 Adjustment disorder with depressed mood: Secondary | ICD-10-CM | POA: Diagnosis not present

## 2017-01-22 DIAGNOSIS — G4733 Obstructive sleep apnea (adult) (pediatric): Secondary | ICD-10-CM | POA: Diagnosis not present

## 2017-01-22 DIAGNOSIS — M1990 Unspecified osteoarthritis, unspecified site: Secondary | ICD-10-CM | POA: Insufficient documentation

## 2017-01-22 DIAGNOSIS — R413 Other amnesia: Secondary | ICD-10-CM

## 2017-01-22 DIAGNOSIS — B1920 Unspecified viral hepatitis C without hepatic coma: Secondary | ICD-10-CM | POA: Insufficient documentation

## 2017-01-22 DIAGNOSIS — E119 Type 2 diabetes mellitus without complications: Secondary | ICD-10-CM | POA: Insufficient documentation

## 2017-01-22 DIAGNOSIS — I10 Essential (primary) hypertension: Secondary | ICD-10-CM | POA: Insufficient documentation

## 2017-01-22 NOTE — Progress Notes (Signed)
Today administered the Wechsler Adult Intelligence Scale-IV as well as the Wechsler Memory Scale-IV. Today was 3 hours of administration time. This measure will be scored or interpreted and report will be written during the next visit.

## 2017-01-23 ENCOUNTER — Encounter (HOSPITAL_BASED_OUTPATIENT_CLINIC_OR_DEPARTMENT_OTHER): Payer: Medicare Other | Admitting: Psychology

## 2017-01-23 ENCOUNTER — Encounter: Payer: Self-pay | Admitting: Psychology

## 2017-01-23 DIAGNOSIS — R413 Other amnesia: Secondary | ICD-10-CM

## 2017-01-23 DIAGNOSIS — G4733 Obstructive sleep apnea (adult) (pediatric): Secondary | ICD-10-CM | POA: Diagnosis not present

## 2017-01-23 DIAGNOSIS — F4321 Adjustment disorder with depressed mood: Secondary | ICD-10-CM

## 2017-01-23 DIAGNOSIS — I10 Essential (primary) hypertension: Secondary | ICD-10-CM | POA: Diagnosis not present

## 2017-01-23 DIAGNOSIS — E119 Type 2 diabetes mellitus without complications: Secondary | ICD-10-CM | POA: Diagnosis not present

## 2017-01-23 DIAGNOSIS — M1990 Unspecified osteoarthritis, unspecified site: Secondary | ICD-10-CM | POA: Diagnosis not present

## 2017-01-23 NOTE — Progress Notes (Addendum)
Patient:  Cathy Gonzalez   DOB: Jan 16, 1946  MR Number: [id number]  Location: [facility] [address], [place] [id number] [place]  [id number] Dept: [phone]  Start: 9 AM End: 10 AM  Provider/Observer:     Edgardo Roys PSYD  Chief Complaint:      Chief Complaint  Patient presents with  . Memory Loss  . Anxiety  . Depression  . Stress    Reason For Service:     The patient was referred for neuropsychological evaluation regarding concerns over possible memory issues. The patient reports that she was diagnosed with a seizure disorder in the 1970s that the neurologist felt triggered by stress. She was treated for some time and her seizures stopped. There were a lot of coping issues at that time had to do with her children particularly one of her daughters. The patient was seizure-free for quite some time. She also had hepatitis C which has now recently been successfully treated. However, more recently the patient has had what appear to be seizures in her sleep. The evidence for this are severe bites on her tongue on the side that happen at night. She does not remember this happening and does not realize she has bitten her tongue until the next morning. She has been followed by Dr. Jannifer Franklin, who prescribed her Keppra.  The patient's children have been concerned about possible memory issues as the patient is described as repeating questions for them over and over and they feel she does not remember their answers.   However, the patient reports that this isn't because she does not remember asking the questions but that she feels like they're not listening to her and she is trying to get a straight answer. There have been a lot of recent stressors have included with her children not helping her out or paying attention to her but this has improved more recently with her son coming to take care  of her.  Testing Administered:  The patient completed the Weschler adult intelligence scale-IV as well as the Wechsler Memory Scale-IV.  Today these measures were scored, interpreted and the formal report was produced.   Participation Level:   Active  Participation Quality:  Appropriate and Attentive      Behavioral Observation:  Well Groomed, Alert, and Appropriate.   Test Results:   Initially, the patient was administered the Wechsler Adult Intelligence Scale. The patient appeared to fully participate in this measure and try her hardest throughout these many measures. This does appear to be a valid assessment.   Composite Score Summary  Scale Sum of Scaled Scores Composite Score Percentile Rank 95% Conf. Interval Qualitative Description  Verbal Comprehension 19 VCI 80 9 75-86 Low Average  Perceptual Reasoning 22 PRI 84 14 79-91 Low Average  Working Memory 14 WMI 83 13 77-91 Low Average  Processing Speed 19 PSI 97 42 89-106 Average  Full Scale 74 FSIQ 82 12 78-86 Low Average  General Ability 41 GAI 80 9 76-86 Low Average   The patient produced full scale IQ of 82 which falls in the low average range and produces a percentile rank relative to normative population of B12 percentile. The patient produced a general abilities index score measure, which is a measure that is a little less sensitive to influence of working memory and processing speed, of 80 which also falls in the low average range and a percentile rank at the 9th  percentile. All of her other composite index scores fell within the lower end of the average range with composite scores between 80 and 82 with the exception of her processing speed index which was in the average range and a composite score of 97. Low average index scores included her verbal comprehension, perceptual reasoning, working memory. The patient's processing speed measures were her strength is far as these global measures.    Verbal Comprehension Subtests  Summary  Subtest Raw Score Scaled Score Percentile Rank Reference Group Scaled Score SEM  Similarities 19 8 25 7  1.04  Vocabulary 15 5 5 5  0.67  Information 8 6 9 7  0.73  (Comprehension) 17 7 16 7  1.08   Subtest making up the verbal comprehension index fell in the low average range with the exception of vocabulary abilities. The patient's verbal reasoning abilities, general fund of information, and social judgment/comprehension were all in the low average range with her with her vocabulary ability to be in the borderline range of functioning.  Perceptual Reasoning Subtests Summary  Subtest Raw Score Scaled Score Percentile Rank Reference Group Scaled Score SEM  Block Design 32 10 50 7 1.08  Matrix Reasoning 5 5 5 2  0.90  Visual Puzzles 8 7 16 6  0.85  (Figure Weights) 6 6 9 5  0.95  (Picture Completion) 9 8 25 7  1.16   Subtests making up the perceptual reasoning index found measures assessing her visual spatial abilities to be in the average range. Mild weaknesses had to do with visual problem solving and ability to complete a gestalt out of individual parts. The patient was also able to identify anomalies from a visual perspective at the lower end of the average range. The patient did have significant difficulties with regard to her visual reasoning and problem-solving as well as her ability to predict an estimate visual information.  Working Doctor, general practice Raw Score Scaled Score Percentile Rank Reference Group Scaled Score SEM  Digit Span 20 7 16 6  0.79  Arithmetic 10 7 16 7  0.99  (Letter-Number Seq.) 18 9 37 8 1.04   Subtest making up a working memory index showed that her abilities for auditory encoding generally fell in the average to low average range of functioning. She actually did better relative to normative population on the more complex letter number sequencing subtest.  Processing Speed Subtests Summary  Subtest Raw Score Scaled Score Percentile Rank Reference  Group Scaled Score SEM  Symbol Search 24 9 37 7 1.31  Coding 55 10 50 7 0.99  (Cancellation) 28 7 16 6  1.34   Subtest making the processing speed index score generally fell in the average range of functioning. This was her highest level of functioning. The patient did well on visual scanning and visual searching measures as well as measures of overall speed of mental operations.  The patient was then administered the Weschler memory scale-IV. The patient also appeared to approach this measure and a honest and straightforward manner and attempted to put full effort into her performance. This does appear to be a valid administration and assessment.   Brief Cognitive Status Exam Classification  Age Years of Education Raw Score Classification Level Base Rate  71 years 0 months 12 49 Low Average 22.1    Index Score Summary  Index Sum of Scaled Scores Index Score Percentile Rank 95% Confidence Interval Qualitative Descriptor  Auditory Memory (AMI) 37 95 37 89-101 Average  Visual Memory (VMI) 13 82 12 78-87 Low Average  Immediate  Memory (IMI) 23 85 16 80-92 Low Average  Delayed Memory (DMI) 27 93 32 86-101 Average   Ability Score:  GAI: 80 Date of Testing:  WAIS-IV 2017/01/22; WMS-IV 2017/01/22  Predicted Difference Method   Index Predicted WMS-IV Index Score Actual WMS-IV Index Score Difference Critical Value  Significant Difference Y/N Base Rate  Auditory Memory 89 95 -6 9.22 N   Visual Memory 88 82 6 8.25 N   Immediate Memory 87 85 2 10.13 N   Delayed Memory 88 93 -5 11.43 N   Statistical significance (critical value) at the .01 level.    Initially, the patient was administered the brief cognitive status examination, which is a measure similar to a mental status exam. She produced a raw score of 49 which falls in the low average range of functioning. Her level of functioning suggested that well enough to be able to provide a valid assessment for the more in-depth and thorough  memory assessment.  The patient produced an immediate memory index score of 85 which falls at the 16th percentile. Based on her global abilities index derived from the Wechsler Adult Intelligence Scale a prediction is established aware the patient should have performed based on this index. The patient's prediction score for immediate memory was 87 and her actual score was 85 which suggest that her memory function is commensurate and consistent with predicted levels. The patient produced an auditory memory index score of 95. This performance falls at the 37th percentile. Comparison is to predicted levels based on her global abilities index (89) shows that she performed consistent with predicted levels and actually warm 6 points higher. The patient produced a visual memory index score of 82 which falls in the 12th percentile. This is slightly below predicted levels but still within consistency levels. The patient produced a delayed memory index score of 93 which falls at the 32nd percentile. The patient was predicted to have produced an index score of 88. The patient's performance was consistent with predicted levels or little bit higher but not significantly higher.  WMS-IV Indexes  Score Score 1 Score 2 Contrast Scaled Score  Auditory Memory Index vs. Visual Memory Index 95 82 6  Immediate Memory Index vs. Delayed Memory Index 85 93 12     Within subtest comparisons show that the patient's performance on auditory memory (scale score 95) compared to her visual memory index (scale score 82) suggest that there is a significant difference between her auditory versus visual memory performances. The patient's auditory memory was 13 points higher than the contrast scaled score of 6. Comparison of her immediate memory index versus delayed memory index also produced a 8 point difference. While this is within normal limits it does suggest that the information that she does remember initially his sustained and  maintained over time. The patient has mild difficulties initially learned information but she is able to retain this information.  Summary of Results:   Overall, the results of the current neuropsychological evaluation showed that the patient is performing globally at the low average range of functioning with a full scale IQ score of 82 and a general abilities index score of 80. The patient showed a general consistency across a broad range of neuropsychological measures with none of her abilities testing showing any severe level of significant impairment except for mild deficits with regard to her general vocabulary knowledge as well as one subtest assessing visual reasoning and problem-solving.  The patient's relative strengths were primarily in the area of mental  processing speed. Using this overall global abilities functioning index to produce prediction of where she should be with regard to overall memory functioning (which is one of her primary complaints) suggest that her overall memory functioning is generally consistent with predicted levels. The only index score area that was even slightly below predicted levels was her visual memory index, which was 6 points below predicted levels. This difference is not statistically significant. She showed a great deal of consistency across almost all areas of memory assessed.   Impression/Diagnosis:   The overall results of the current neuropsychological assessment do not suggest any deficits or indications of progressive neurocognitive incline. While we may have to perform repeat testing in a year or so to firmly determine the lack of progressive deterioration, the patient's current predicted level of functioning and current level of functioning throughout a broad range of neuropsychological areas were all in the average to low average range of functioning. There was considerable consistency across these levels of functioning. The patient did show relative  strengths with regard to speed of mental operations and auditory memory but these functions were still in the average range. The patient did have some individual subtest where she had some difficulty. the most notably one measuring visual reasoning and problem solving. However, her performance was was consistent with her reference group and did not suggest significant neuropsychological deficits when taken into account with her overall performance. While I cannot say based on these data that the patient is not having seizures or other neurological issues, her level of memory and neuropsychological performance on other measures were all consistent with someone performing where we would expect her to be performing and there is no clear evidence of neuropsychological decline.  I do think that most of the issues that her family members are noticing likely have to do with depression anxiety type symptoms. She describes a great deal of frustration around involvement her children are having in her life and I think that the patient's description of the stressors likely are playing a major role.  We have set up an appointment for me to go over the results of this current neuropsychological assessment. If you have any further questions please feel free to contact me.  Diagnosis:    Axis I: Memory loss  Adjustment disorder with depressed mood   Ilean Skill, Psy.D. Neuropsychologist       WAIS-IV Wechsler Adult Intelligence Scale-Fourth Edition Score Report   Examinee Name Marvelene Stoneberg  Date of Report 2017/01/23  Justice Rocher [id number]  Years of Education   Date of Birth 07-Feb-1946  Primary Language   Gender Female  Handedness   Race/Ethnicity   Examiner Name Ilean Skill  Date of Testing 2017/01/22  Age at Testing 62 years 0 months  Retest? No   Comments: Composite Score Summary  Scale Sum of Scaled Scores Composite Score Percentile Rank 95% Conf. Interval Qualitative Description    Verbal Comprehension 19 VCI 80 9 75-86 Low Average  Perceptual Reasoning 22 PRI 84 14 79-91 Low Average  Working Memory 14 WMI 83 13 77-91 Low Average  Processing Speed 19 PSI 97 42 89-106 Average  Full Scale 74 FSIQ 82 12 78-86 Low Average  General Ability 41 GAI 80 9 76-86 Low Average  Confidence Intervals are based on the Overall Average SEMs. The [facility] is an optional composite summary score that is less sensitive to the influence of working memory and processing speed. Because working memory and processing speed are vital to a comprehensive  evaluation of cognitive ability, it should be noted that the [facility] does not have the breadth of construct coverage as the FSIQ.     Note. The vertical bars represent the standard error of measurement (SEM). SEM values are based on the examinee's age.   ANALYSIS   Index Level Discrepancy Comparisons  Comparison Score 1 Score 2 Difference Critical Value .05 Significant Difference Y/N Base Rate by Overall Sample  VCI - PRI 80 84 -4 8.31 N 39.7  VCI - WMI 80 83 -3 8.82 N 41.5  VCI - PSI 80 97 -17 10.19 Y 14.2  PRI - WMI 84 83 1 9.74 N 49.0  PRI - PSI 84 97 -13 11.00 Y 19.0  WMI - PSI 83 97 -14 11.38 Y 17.7  FSIQ - GAI 82 80 2 3.08 N 38.4  Base Rate by Overall Sample. Statistical significance (critical value) at the .05 level.  Verbal Comprehension Subtests Summary  Subtest Raw Score Scaled Score Percentile Rank Reference Group Scaled Score SEM  Similarities 19 8 25 7  1.04  Vocabulary 15 5 5 5  0.67  Information 8 6 9 7  0.73  (Comprehension) 17 7 16 7  1.08   Perceptual Reasoning Subtests Summary  Subtest Raw Score Scaled Score Percentile Rank Reference Group Scaled Score SEM  Block Design 32 10 50 7 1.08  Matrix Reasoning 5 5 5 2  0.90  Visual Puzzles 8 7 16 6  0.85  (Figure Weights) 6 6 9 5  0.95  (Picture Completion) 9 8 25 7  1.16   Working Doctor, general practice Raw Score Scaled Score Percentile Rank Reference Group Scaled  Score SEM  Digit Span 20 7 16 6  0.79  Arithmetic 10 7 16 7  0.99  (Letter-Number Seq.) 18 9 37 8 1.04   Processing Speed Subtests Summary  Subtest Raw Score Scaled Score Percentile Rank Reference Group Scaled Score SEM  Symbol Search 24 9 37 7 1.31  Coding 55 10 50 7 0.99  (Cancellation) 28 7 16 6  1.34   Subtest Level Discrepancy Comparisons  Subtest Comparison Score 1 Score 2 Difference Critical Value .05 Significant Difference Y/N Base Rate  Digit Span - Arithmetic 7 7 0 2.57 N   Symbol Search - Coding 9 10 -1 3.41 N 40.10  Statistical significance (critical value) at the .05 level.       DETERMINING STRENGTHS AND WEAKNESSES   Differences Between Subtest and Overall Mean of Subtest Scores  Subtest Subtest Scaled Score Mean Scaled Score Difference Critical Value .05 Strength or Weakness Base Rate  Block Design 10 7.40 2.60 2.85  15-25%  Similarities 8 7.40 0.60 2.82  >25%  Digit Span 7 7.40 -0.40 2.22  >25%  Matrix Reasoning 5 7.40 -2.40 2.54  25%  Vocabulary 5 7.40 -2.40 2.03 W 15-25%  Arithmetic 7 7.40 -0.40 2.73  >25%  Symbol Search 9 7.40 1.60 3.42  >25%  Visual Puzzles 7 7.40 -0.40 2.71  >25%  Information 6 7.40 -1.40 2.19  >25%  Coding 10 7.40 2.60 2.97  15-25%  Overall: Mean = 7.40, Scatter =  5, Base rate =  85.4 Base Rate for Intersubtest Scatter is reported for 10 Subtests. Statistical significance (critical value) at the .05 level.   PROCESS ANALYSIS   Perceptual Reasoning Process Score Summary  Process Score Raw Score Scaled Score Percentile Rank SEM  Block Design No Time Bonus 32 10 50 1.16    Working Memory Process Score Summary  Process Score Raw Score Scaled Score Percentile  Rank Base Rate SEM  Digit Span Forward 8 8 25  -- 1.37  Digit Span Backward 8 10 50 -- 1.41  Digit Span Sequencing 4 6 9  -- 1.31  Longest Digit Span Forward 5 -- -- 95.5 --  Longest Digit Span Backward 5 -- -- 46.0 --  Longest Digit Span Sequence 3 -- -- 96.0 --    Longest Letter-Number Sequence 5 -- -- 68.0 --    Process Level Discrepancy Comparisons  Process Comparison Score 1 Score 2 Difference Critical Value .05 Significant Difference Y/N Base Rate  Block Design - Block Design No Time Bonus 10 10 0 3.08 N   Digit Span Forward - Digit Span Backward 8 10 -2 3.65 N 31.5  Digit Span Forward - Digit Span Sequencing 8 6 2  3.60 N 31.3  Digit Span Backward - Digit Span Sequencing 10 6 4  3.56 Y 12.5  Longest Digit Span Forward - Longest Digit Span Backward 5 5 0 -- --   Longest Digit Span Forward - Longest Digit Span Sequence 5 3 2  -- -- 35.5  Longest Digit Span Backward - Longest Digit Span Sequence 5 3 2  -- -- 4.5  Statistical significance (critical value) at the .05 level.   Raw Scores  Subtest Score Range Raw Score  Process Score Range Raw Score  Block Design 0-66 32  Block Design No Time Bonus 0-48 32  Similarities 0-36 19  Digit Span Forward 0-16 8  Digit Span 0-48 20  Digit Span Backward 0-16 8  Matrix Reasoning 0-26 5  Digit Span Sequencing 0-16 4  Vocabulary 0-57 15  Longest Digit Span Forward 0, 2-9 5  Arithmetic 0-22 10  Longest Digit Span Backward 0, 2-8 5  Symbol Search 0-60 24  Longest Digit Span Sequence 0, 2-9 3  Visual Puzzles 0-26 8  Longest Letter-Number Seq. 0, 2-8 5  Information 0-26 8      Coding 0-135 55      Letter-Number Seq. 0-30 18      Figure Weights 0-27 6      Comprehension 0-36 17      Cancellation 0-72 28      Picture Completion 0-24 9        End of Report   Wechsler Memory Scale-Fourth Edition Score Report   Examinee Name Brenly Trawick  Date of Report 2017/01/23  Justice Rocher [id number]  Years of Education 13  Date of Birth March 15, 1946  Home Language   Gender Female  Handedness Not Specified  Race/Ethnicity   Examiner Name Ilean Skill  Date of Testing 2017/01/22  Age at Testing 89 years 0 months  Retest? No   Comments: Brief Cognitive Status Exam Classification  Age Years of Education  Raw Score Classification Level Base Rate  71 years 0 months 12 49 Low Average 22.1    Index Score Summary  Index Sum of Scaled Scores Index Score Percentile Rank 95% Confidence Interval Qualitative Descriptor  Auditory Memory (AMI) 37 95 37 89-101 Average  Visual Memory (VMI) 13 82 12 78-87 Low Average  Immediate Memory (IMI) 23 85 16 80-92 Low Average  Delayed Memory (DMI) 27 93 32 86-101 Average    Index Score Profile  The vertical bars represent the standard error of measurement (SEM).   Primary Subtest Scaled Score Summary  Subtest Domain Raw Score Scaled Score Percentile Rank  Logical Memory I AM 26 8 25   Logical Memory II AM 18 10 50  Verbal Paired Associates I AM 18 9 37  Verbal [facility] II AM 6 10 50  Visual Reproduction I VM 24 6 9   Visual Reproduction II VM 10 7 16   Symbol Span VWM 4 3 1    Primary Subtest Scaled Score Profile    PROCESS SCORE CONVERSIONS  Auditory Memory Process Score Summary  Process Score Raw Score Scaled Score Percentile Rank Cumulative Percentage (Base Rate)  LM II Recognition 18 - - 26-50%  VPA II Recognition 30 - - >75%   Visual Memory Process Score Summary  Process Score Raw Score Scaled Score Percentile Rank Cumulative Percentage (Base Rate)  VR II Recognition 6 - - >75%    SUBTEST-LEVEL DIFFERENCES WITHIN INDEXES  Auditory Memory Index  Subtest Scaled Score AMI Mean Score Difference from Mean Critical Value Base Rate  Logical Memory I 8 9.25 -1.25 2.45 >25%  Logical Memory II 10 9.25 0.75 2.38 >25%  Verbal [facility] Associates I 9 9.25 -0.25 2.04 >25%  Verbal [facility] Associates II 10 9.25 0.75 3.06 >25%  Statistical significance (critical value) at the .05 level.   Immediate Memory Index  Subtest Scaled Score IMI Mean Score Difference from Mean Critical Value Base Rate  Logical Memory I 8 7.67 0.33 2.01 >25%  Verbal [facility] Associates I 9 7.67 1.33 1.71 >25%  Visual Reproduction I 6 7.67 -1.67 1.71 >25%    Statistical significance (critical value) at the .05 level.   Delayed Memory Index  Subtest Scaled Score DMI Mean Score Difference from Mean Critical Value Base Rate  Logical Memory II 10 9.00 1.00 2.15 >25%  Verbal [facility] Associates II 10 9.00 1.00 2.63 >25%  Visual Reproduction II 7 9.00 -2.00 1.77 >25%  Statistical significance (critical value) at the .05 level.    SUBTEST DISCREPANCY COMPARISON  Comparison Score 1 Score 2 Difference Critical Value Base Rate  Visual Reproduction I - Visual Reproduction II 6 7 -1 1.99 81.0  Statistical significance (critical value) at the .05 level.    SUBTEST-LEVEL CONTRAST SCALED SCORES  Logical Memory  Score Score 1 Score 2 Contrast Scaled Score  LM II Recognition vs. Delayed Recall 26-50% 10 11  LM Immediate Recall vs. Delayed Recall 8 10 12    Verbal [facility] Associates  Score Score 1 Score 2 Contrast Scaled Score  VPA II Recognition vs. Delayed Recall >75% 10 8  VPA Immediate Recall vs. Delayed Recall 9 10 12    Visual Reproduction  Score Score 1 Score 2 Contrast Scaled Score  VR II Recognition vs. Delayed Recall >75% 7 5  VR Immediate Recall vs. Delayed Recall 6 7 9     INDEX-LEVEL CONTRAST SCALED SCORES  WMS-IV Indexes  Score Score 1 Score 2 Contrast Scaled Score  Auditory Memory Index vs. Visual Memory Index 95 82 6  Immediate Memory Index vs. Delayed Memory Index 85 93 12    RAW SCORES  Subtest Score Range Adult Score Range Older Adult Raw Score  Brief Cognitive Status Exam 0-58 0-58 49  Logical Memory I 0-50 0-53 26  Logical Memory II 0-50 0-39 18  Verbal [facility] Associates I 0-56 0-40 18  Verbal [facility] II 0-14 0-10 6  CVLT-II Trials 1-5 5-95 5-95   CVLT-II Long-Delay -5-5 -5-5   Designs I 0-120    Designs II 0-120    Visual Reproduction I 0-43 0-43 24  Visual Reproduction II 0-43 0-43 10  Spatial Addition 0-24    Symbol Span 0-50 0-50 4  Process Score Range Adult Score Range Older Adult Raw Score   LM II Recognition  0-30 0-23 18  VPA II Recognition 0-40 0-30 30  VPA II Word Recall 0-28 0-20   DE I Content 0-48    DE I Spatial 0-24    DE II Content 0-48    DE II Spatial 0-24    DE II Recognition 0-24    VR II Recognition 0-7 0-7 6  VR II Copy 0-43 0-43     ABILITY-MEMORY ANALYSIS  Ability Score:  GAI: 80 Date of Testing:  WAIS-IV 2017/01/22; WMS-IV 2017/01/22  Predicted Difference Method   Index Predicted WMS-IV Index Score Actual WMS-IV Index Score Difference Critical Value  Significant Difference Y/N Base Rate  Auditory Memory 89 95 -6 9.22 N   Visual Memory 88 82 6 8.25 N   Immediate Memory 87 85 2 10.13 N   Delayed Memory 88 93 -5 11.43 N   Statistical significance (critical value) at the .01 level.   Contrast Scaled Scores  Score Score 1 Score 2 Contrast Scaled Score  General Ability Index vs. Auditory Memory Index 80 95 11  General Ability Index vs. Visual Memory Index 80 82 9  General Ability Index vs. Immediate Memory Index 80 85 9  General Ability Index vs. Delayed Memory Index 80 93 10  Verbal Comprehension Index vs. Auditory Memory Index 80 95 11  Perceptual Reasoning Index vs. Visual Memory Index 84 82 8  Working Memory Index vs. Auditory Memory Index 83 95 11    End of Report

## 2017-01-25 ENCOUNTER — Telehealth: Payer: Self-pay | Admitting: Neurology

## 2017-01-25 NOTE — Telephone Encounter (Signed)
    Neuropsychological evaluation 01/23/17:  Impression/Diagnosis:                           The overall results of the current neuropsychological assessment do not suggest any deficits or indications of progressive neurocognitive incline. While we may have to perform repeat testing in a year or so to firmly determine the lack of progressive deterioration, the patient's current predicted level of functioning and current level of functioning throughout a broad range of neuropsychological areas were all in the average to low average range of functioning. There was considerable consistency across these levels of functioning. The patient did show relative strengths with regard to speed of mental operations and auditory memory but these functions were still in the average range. The patient did have some individual subtest where she had some difficulty. the most notably one measuring visual reasoning problems following. However, her performance was was consistent with her reference group and did not suggest significant neuropsychological deficits when taken into account with her overall performance. While I cannot say based on these data that the patient is not having seizures or other neurological issues, her level of memory and neuropsychological performance on other measures were all consistent with someone performing where we would expect her to be performing and there is no clear evidence of neuropsychological decline.  I do think that most of the issues that her family members are noticing likely have to do with depression anxiety type symptoms. She describes a great deal of frustration around involvement her children are having in her life and I think that the patient's description of the stressors likely are playing a major role.

## 2017-01-26 ENCOUNTER — Ambulatory Visit
Admission: RE | Admit: 2017-01-26 | Discharge: 2017-01-26 | Disposition: A | Discharge status: Home / Self Care | Payer: Medicare Other | Source: Ambulatory Visit | Attending: Family Medicine | Admitting: Family Medicine

## 2017-01-26 DIAGNOSIS — Z1231 Encounter for screening mammogram for malignant neoplasm of breast: Secondary | ICD-10-CM

## 2017-02-03 ENCOUNTER — Ambulatory Visit (INDEPENDENT_AMBULATORY_CARE_PROVIDER_SITE_OTHER): Payer: Medicare Other | Admitting: Orthopaedic Surgery

## 2017-02-03 ENCOUNTER — Encounter (INDEPENDENT_AMBULATORY_CARE_PROVIDER_SITE_OTHER): Payer: Self-pay | Admitting: Orthopaedic Surgery

## 2017-02-03 VITALS — BP 132/70 | HR 89 | Resp 14 | Ht 68.0 in | Wt 261.0 lb

## 2017-02-03 DIAGNOSIS — M25551 Pain in right hip: Secondary | ICD-10-CM

## 2017-02-03 MED ORDER — LIDOCAINE HCL 1 % IJ SOLN
3.0000 mL | INTRAMUSCULAR | Status: AC | PRN
  Administered 2017-02-03: 3 mL

## 2017-02-03 MED ORDER — METHYLPREDNISOLONE ACETATE 40 MG/ML IJ SUSP
80.0000 mg | INTRAMUSCULAR | Status: AC | PRN
  Administered 2017-02-03: 80 mg

## 2017-02-03 NOTE — Progress Notes (Signed)
Office Visit Note   Patient: Cathy Gonzalez           Date of Birth: [birth year]           MRN: [id number] Visit Date: 02/03/2017              Requested by: [name], MD [address] [place], [place] [address] PCP: [name], MD   Assessment & Plan: Visit Diagnoses:  1. Pain of right hip joint    Symptoms consistent with greater trochanteric bursitis Plan: We'll plan on local cortisone injection in the area of the greater trochanter. Cathy Gonzalez has had a prior cortisone injection that area with good relief of her pain. She also was experiencing symptoms of arthritis in her right knee we'll plan to see her back in 2 weeks if she still having a problem and consider injecting that as well  Follow-Up Instructions: Return if symptoms worsen or fail to improve.   Orders:  No orders of the defined types were placed in this encounter.  No orders of the defined types were placed in this encounter.     Procedures: Large Joint Inj Date/Time: 02/03/2017 3:19 PM Performed by: Garald Balding Authorized by: Garald Balding   Consent Given by:  Patient Site marked: the procedure site was marked   Timeout: prior to procedure the correct patient, procedure, and site was verified   Indications:  Pain Location:  Hip Site:  R greater trochanter Prep: patient was prepped and draped in usual sterile fashion   Needle Size:  22 G Needle Length:  3.5 inches Approach:  Lateral Ultrasound Guidance: No   Fluoroscopic Guidance: No   Arthrogram: No   Medications:  3 mL lidocaine 1 %; 80 mg methylPREDNISolone acetate 40 MG/ML Aspiration Attempted: No   Patient tolerance:  Patient tolerated the procedure well with no immediate complications     Clinical Data: No additional findings.   Subjective: Chief Complaint  Patient presents with  . Right Knee - Pain, Edema, Follow-up    Cathy Gonzalez is a 71 y o that is here for follow up for LBP, Right hip and R knee pain with tenderness in  her left calf. 01/19/17 Dr. Ernestina Patches lumbar injections worked well but not for other areas.  . Right Hip - Pain, Follow-up    Right Groin pain  Cathy Gonzalez relates that her back is "doing well" after the injections. She is presently having some pain localized along the lateral aspect of her right hip in the area of the greater trochanter as well as right knee pain. She's been wearing a pullover knee support which "helps". She relates having had a prior cortisone injection over her right hip which today is the more symptomatic area. She's not having any numbness or tingling. She denies any recent injury or trauma.  HPI  Review of Systems   Objective: Vital Signs: BP 132/70   Pulse 89   Resp 14   Ht 5\' 8"  (1.727 m)   Wt 261 lb (118.4 kg)   BMI 39.68 kg/m   Physical Exam  Ortho Exam localized tenderness over the greater trochanter right hip. Very large thighs with abundant adipose. No particular pain with range of motion of her hip. Skin intact. No localized tenderness lumbar spine. Straight leg raise negative. Small effusion right knee with medial and lateral joint pain.  Specialty Comments:  No specialty comments available.  Imaging: No results found.   PMFS History: Patient Active  Problem List   Diagnosis Date Noted  . Dizziness and giddiness 01/14/2016  . Hepatic cirrhosis ([place]) 07/26/2015  . Nocturnal seizures ([place]) 05/14/2015  . Chronic hepatitis C without hepatic coma ([place]) 03/14/2015  . Vertigo, central 02/11/2014  . Difficulty walking 02/11/2014  . Diabetes mellitus ([place]) 02/11/2014  . Dysphagia 02/11/2014  . OBESITY 07/26/2010  . Essential hypertension, benign 07/26/2010  . DEGENERATIVE JOINT DISEASE, GENERALIZED 07/26/2010   Past Medical History:  Diagnosis Date  . Arthritis   . Diabetes mellitus   . Diabetic neuropathy, type II diabetes mellitus ([place])   . Hepatitis C   . Hypertension   . Hypokalemia   . Morbidly obese ([place])   . Nocturnal seizures ([place]) 05/14/2015   . OSA on CPAP     Family History  Problem Relation Age of Onset  . Diabetes Sister   . Diabetes Brother   . Cancer Sister     breast  . Breast cancer Sister   . Seizures Neg Hx     Past Surgical History:  Procedure Laterality Date  . ABDOMINAL HYSTERECTOMY    . CATARACT EXTRACTION Left   . COLONOSCOPY WITH PROPOFOL N/A 08/21/2015   Procedure: COLONOSCOPY WITH PROPOFOL;  Surgeon: Carol Ada, MD;  Location: [facility];  Service: Endoscopy;  Laterality: N/A;  . ESOPHAGOGASTRODUODENOSCOPY (EGD) WITH PROPOFOL N/A 08/21/2015   Procedure: ESOPHAGOGASTRODUODENOSCOPY (EGD) WITH PROPOFOL;  Surgeon: Carol Ada, MD;  Location: [facility];  Service: Endoscopy;  Laterality: N/A;   Social History   Occupational History  . Cathy Gonzalez    Social History Main Topics  . Smoking status: Never Smoker  . Smokeless tobacco: Never Used  . Alcohol use No  . Drug use: No  . Sexual activity: No     Garald Balding, MD   Note - This record has been created using [employer].  Chart creation errors have been sought, but may not always  have been located. Such creation errors do not reflect on  the standard of medical care.

## 2017-02-05 ENCOUNTER — Ambulatory Visit: Payer: Medicare Other | Admitting: Podiatry

## 2017-02-09 ENCOUNTER — Ambulatory Visit (INDEPENDENT_AMBULATORY_CARE_PROVIDER_SITE_OTHER): Payer: Medicare Other | Admitting: Adult Health

## 2017-02-09 ENCOUNTER — Encounter: Payer: Self-pay | Admitting: Adult Health

## 2017-02-09 VITALS — BP 129/87 | HR 73 | Ht 64.0 in | Wt 262.8 lb

## 2017-02-09 DIAGNOSIS — G40909 Epilepsy, unspecified, not intractable, without status epilepticus: Secondary | ICD-10-CM | POA: Diagnosis not present

## 2017-02-09 DIAGNOSIS — R569 Unspecified convulsions: Secondary | ICD-10-CM

## 2017-02-09 MED ORDER — LACOSAMIDE 50 MG PO TABS: 50.0000 mg | ORAL_TABLET | Freq: Two times a day (BID) | ORAL | 5 refills | Status: DC

## 2017-02-09 NOTE — Progress Notes (Signed)
PATIENT: Cathy Gonzalez DOB: [birth year]  REASON FOR VISIT: follow up- seizures HISTORY FROM: patient  HISTORY OF PRESENT ILLNESS: [name] is a 71 year old female with a history of nocturnal seizures. She returns today for follow-up. She is currently taking Keppra 1500 mg twice a day. She states that since her last visit she had 3 "light seizures." She states that she woke up and she had bitten her tongue. She states that in the past when she was on carbamazepine her seizures were under good control. However this medication had to be discontinued after she was placed on antiviral therapy for hepatitis C. She is able to complete all ADLs independently. She operates a Teacher, music without difficulty. Denies any daytime seizures. She states that her stress is under better control. She is not using CPAP machine. She returns today for an evaluation.  HISTORY 08/11/16: [name] is a 71 year old female with a history of nocturnal seizures. She returns today for follow-up. At the last visit Keppra was increased to 1000 mg in the morning and 1500 mg in the evening. She reports that she had a seizure last Thursday. She states that when she woke up she was she had bit the left side of her tongue. She states this is normally how she knew she had a seizure. She cannot tell me how many seizures she had in a week or months time. She is not using her CPAP regularly. She states that she needs new supplies. She has never had a seizure during the day. Her nocturnal seizures have not been witnessed. She lives at home by herself. Denies any significant changes with her mood or behavior. She states that she is having right hip pain and that has affected her gait and balance. Denies missing any medication. She states that increased stress is a trigger for her seizures. She returns today for an evaluation.   REVIEW OF SYSTEMS: Out of a complete 14 system review of symptoms, the patient complains only of the following symptoms,  and all other reviewed systems are negative.  Apnea, snoring, muscle cramps, walking difficulty, seizure, leg swelling, blurred vision  ALLERGIES: No Known Allergies  HOME MEDICATIONS: Outpatient Medications Prior to Visit  Medication Sig Dispense Refill  . amLODipine (NORVASC) 10 MG tablet Take 10 mg by mouth daily.    [name] aspirin EC 81 MG tablet Take 1 tablet (81 mg total) by mouth daily. 30 tablet 3  . chlorthalidone (HYGROTON) 25 MG tablet     . colchicine 0.6 MG tablet Take 0.6 mg by mouth daily.     . diclofenac sodium (VOLTAREN) 1 % GEL Apply 1 application topically 2 (two) times daily. Reported on 04/03/2016  0  . furosemide (LASIX) 40 MG tablet     . KLOR-CON M20 20 MEQ tablet     . levETIRAcetam (KEPPRA) 500 MG tablet Take 3 tablets (1,500 mg total) by mouth 2 (two) times daily. 540 tablet 3  . metFORMIN (GLUCOPHAGE) 500 MG tablet Take 500 mg by mouth at bedtime.     . montelukast (SINGULAIR) 10 MG tablet Take 10 mg by mouth at bedtime.    . Potassium Chloride ER 20 MEQ TBCR     . pregabalin (LYRICA) 300 MG capsule Take 1 capsule (300 mg total) by mouth 2 (two) times daily. 60 capsule 5  . sucralfate (CARAFATE) 1 G tablet Take 1 g by mouth 4 (four) times daily -  with meals and at bedtime.    [name] tiZANidine (ZANAFLEX)  4 MG tablet Take 4 mg by mouth at bedtime.     . valsartan (DIOVAN) 320 MG tablet Take 320 mg by mouth daily.     No facility-administered medications prior to visit.     PAST MEDICAL HISTORY: Past Medical History:  Diagnosis Date  . Arthritis   . Diabetes mellitus   . Diabetic neuropathy, type II diabetes mellitus ([place])   . Hepatitis C   . Hypertension   . Hypokalemia   . Morbidly obese ([place])   . Nocturnal seizures ([place]) 05/14/2015  . OSA on CPAP     PAST SURGICAL HISTORY: Past Surgical History:  Procedure Laterality Date  . ABDOMINAL HYSTERECTOMY    . CATARACT EXTRACTION Left   . COLONOSCOPY WITH PROPOFOL N/A 08/21/2015   Procedure: COLONOSCOPY WITH  PROPOFOL;  Surgeon: Carol Ada, MD;  [facility];  Service: Endoscopy;  Laterality: N/A;  . ESOPHAGOGASTRODUODENOSCOPY (EGD) WITH PROPOFOL N/A 08/21/2015   Procedure: ESOPHAGOGASTRODUODENOSCOPY (EGD) WITH PROPOFOL;  Surgeon: Carol Ada, MD;  [facility];  Service: Endoscopy;  Laterality: N/A;    FAMILY HISTORY: Family History  Problem Relation Age of Onset  . Diabetes Sister   . Diabetes Brother   . Cancer Sister     breast  . Breast cancer Sister   . Seizures Neg Hx     SOCIAL HISTORY: Social History   Social History  . Marital status: Single    Spouse name: N/A  . Number of children: 4  . Years of education: 12   Occupational History  . [name]    Social History Main Topics  . Smoking status: Never Smoker  . Smokeless tobacco: Never Used  . Alcohol use No  . Drug use: No  . Sexual activity: No   Other Topics Concern  . Not on file   Social History Narrative   Patient occasionally drinks caffeine.   Patient is right handed.      PHYSICAL EXAM  Vitals:   02/09/17 1416  BP: 129/87  Pulse: 73  Weight: 262 lb 12.8 oz (119.2 kg)  Height: 5\' 4"  (1.626 m)   Body mass index is 45.11 kg/m.  Generalized: Well developed, in no acute distress   Neurological examination  Mentation: Alert oriented to time, place, history taking. Follows all commands speech and language fluent Cranial nerve II-XII: Pupils were equal round reactive to light. Extraocular movements were full, visual field were full on confrontational test. Facial sensation and strength were normal. Uvula tongue midline. Head turning and shoulder shrug  were normal and symmetric. Motor: The motor testing reveals 5 over 5 strength of all 4 extremities. Good symmetric motor tone is noted throughout.  Sensory: Sensory testing is intact to soft touch on all 4 extremities. No evidence of extinction is noted.  Coordination: Cerebellar testing reveals good finger-nose-finger and  heel-to-shin bilaterally.  Gait and station: Gait is normal.   Reflexes: Deep tendon reflexes are symmetric and normal bilaterally.   DIAGNOSTIC DATA (LABS, IMAGING, TESTING) - I reviewed patient records, labs, notes, testing and imaging myself where available.  Lab Results  Component Value Date   WBC 6.7 08/11/2016   HGB 11.9 (L) 07/12/2015   HCT 35.9 08/11/2016   MCV 87 08/11/2016   PLT 283 08/11/2016      Component Value Date/Time   NA 140 10/20/2016 1549   NA 140 08/11/2016 0833   K 3.3 (L) 10/20/2016 1549   CL 98 10/20/2016 1549   CO2 33 (H) 10/20/2016 1549  GLUCOSE 190 (H) 10/20/2016 1549   BUN 25 10/20/2016 1549   BUN 23 08/11/2016 0833   CREATININE 1.08 (H) 10/20/2016 1549   CALCIUM 10.5 (H) 10/20/2016 1549   PROT 7.5 10/20/2016 1549   PROT 7.2 08/11/2016 0833   ALBUMIN 4.6 10/20/2016 1549   ALBUMIN 4.5 08/11/2016 0833   AST 24 10/20/2016 1549   ALT 32 (H) 10/20/2016 1549   ALKPHOS 45 10/20/2016 1549   BILITOT 0.3 10/20/2016 1549   BILITOT <0.2 08/11/2016 0833   GFRNONAA 53 (L) 10/20/2016 1549   GFRAA 61 10/20/2016 1549   Lab Results  Component Value Date   CHOL 216 (H) 02/11/2014   HDL 65 02/11/2014   LDLCALC 133 (H) 02/11/2014   TRIG 92 02/11/2014   CHOLHDL 3.3 02/11/2014   Lab Results  Component Value Date   HGBA1C 7.2 (H) 02/11/2014      ASSESSMENT AND PLAN 71 y.o. year old female  has a past medical history of Arthritis; Diabetes mellitus; Diabetic neuropathy, type II diabetes mellitus ([place]); Hepatitis C; Hypertension; Hypokalemia; Morbidly obese ([place]); Nocturnal seizures ([place]) (05/14/2015); and OSA on CPAP. here with:  1. Nocturnal seizures  The patient has had 3 seizures since her last visit. I discussed with Dr. Jannifer Franklin. She will continue on Keppra 1500 mg twice a day. We will start Vimpat 50 mg twice a day. I have reviewed side effects with the patient. She verbalized understanding. Advised that if she continues to have seizure event she  shouldl let us know. She will follow-up in 6 months with Dr. Jannifer Franklin.  I spent 15 minutes with the patient 50% this time was spent reviewing Vimpat.    Ward Givens, MSN, NP-C 02/09/2017, 2:42 PM [facility] [address], [place], [place] [address] [phone]

## 2017-02-09 NOTE — Patient Instructions (Signed)
Continue Keppra 1500 mg twice a day Start Vimpat 50 mg twice a day If your symptoms worsen or you develop new symptoms please let us know.   Lacosamide tablets What is this medicine? LACOSAMIDE (la KOE sa mide) is used to control seizures caused by certain types of epilepsy. This medicine may be used for other purposes; ask your health care provider or pharmacist if you have questions. COMMON BRAND NAME(S): Vimpat What should I tell my health care provider before I take this medicine? They need to know if you have any of these conditions: -dehydration -heart disease, including heart failure -history of a drug or alcohol abuse problem -kidney disease -liver disease -suicidal thoughts, plans, or attempt; a previous suicide attempt by you or a family member -an unusual or allergic reaction to lacosamide, other medicines, foods, dyes, or preservatives -pregnant or trying to get pregnant -breast-feeding How should I use this medicine? Take this medicine by mouth with a glass of water. You can take it with or without food. Follow the directions on the prescription label. Take your doses at regular intervals. Do not take your medicine more often than directed. Do not stop taking except on the advice of your doctor or health care professional. A special MedGuide will be given to you by the pharmacist with each prescription and refill. Be sure to read this information carefully each time. Talk to your pediatrician regarding the use of this medicine in children. While this drug may be prescribed for children as young as 21 years of age for selected conditions, precautions do apply. Overdosage: If you think you have taken too much of this medicine contact a poison control center or emergency room at once. NOTE: This medicine is only for you. Do not share this medicine with others. What if I miss a dose? If you miss a dose, take it as soon as you can. If it is almost time for your next dose, take only  that dose. Do not take double or extra doses. What may interact with this medicine? -atazanavir -beta-blockers like metoprolol and propranolol -calcium channel blockers like diltiazem and verapamil -digoxin -dronedarone -lopinavir/ritonavir This list may not describe all possible interactions. Give your health care provider a list of all the medicines, herbs, non-prescription drugs, or dietary supplements you use. Also tell them if you smoke, drink alcohol, or use illegal drugs. Some items may interact with your medicine. What should I watch for while using this medicine? Visit your doctor or health care professional for regular checks on your progress. This medicine needs careful monitoring. Wear a medical ID bracelet or chain, and carry a card that describes your disease and details of your medicine and dosage times. You may get drowsy or dizzy. Do not drive, use machinery, or do anything that needs mental alertness until you know how this medicine affects you. Do not stand or sit up quickly, especially if you are an older patient. This reduces the risk of dizzy or fainting spells. Alcohol may interfere with the effect of this medicine. Avoid alcoholic drinks. The use of this medicine may increase the chance of suicidal thoughts or actions. Pay special attention to how you are responding while on this medicine. Any worsening of mood, or thoughts of suicide or dying should be reported to your health care professional right away. Women who become pregnant while using this medicine may enroll in the [place] Pregnancy Registry by calling [phone]. This registry collects information about the safety of antiepileptic  drug use during pregnancy. What side effects may I notice from receiving this medicine? Side effects that you should report to your doctor or health care professional as soon as possible: -allergic reactions like skin rash, itching or hives, swelling of the  face, lips, or tongue -confusion -feeling faint or lightheaded, falls -fever -irregular heart beat -loss of memory -suicidal thoughts or other mood changes -unusually weak or tired -yellowing of the eyes, skin Side effects that usually do not require medical attention (report to your doctor or health care professional if they continue or are bothersome): -constipation -diarrhea -drowsiness -dry mouth -headache -nausea This list may not describe all possible side effects. Call your doctor for medical advice about side effects. You may report side effects to FDA at 1-800-FDA-1088. Where should I keep my medicine? Keep out of the reach of children. This medicine can be abused. Keep your medicine in a safe place to protect it from theft. Do not share this medicine with anyone. Selling or giving away this medicine is dangerous and against the law. This medicine may cause accidental overdose and death if it taken by other adults, children, or pets. Mix any unused medicine with a substance like cat litter or coffee grounds. Then throw the medicine away in a sealed container like a sealed bag or a coffee can with a lid. Do not use the medicine after the expiration date. Store at room temperature between 15 and 30 degrees C (59 and 86 degrees F). NOTE: This sheet is a summary. It may not cover all possible information. If you have questions about this medicine, talk to your doctor, pharmacist, or health care provider.  2018 Elsevier/Gold Standard (2016-08-14 08:32:52)

## 2017-02-09 NOTE — Progress Notes (Signed)
I have read the note, and I agree with the clinical assessment and plan.  Aveline Daus KEITH   

## 2017-02-09 NOTE — Progress Notes (Signed)
Received fax confirmation for vimpat to local [facility] [phone].

## 2017-02-16 ENCOUNTER — Telehealth (INDEPENDENT_AMBULATORY_CARE_PROVIDER_SITE_OTHER): Payer: Self-pay | Admitting: Orthopaedic Surgery

## 2017-02-16 ENCOUNTER — Encounter (HOSPITAL_COMMUNITY): Payer: Self-pay

## 2017-02-16 ENCOUNTER — Emergency Department (HOSPITAL_COMMUNITY)
Admission: EM | Admit: 2017-02-16 | Discharge: 2017-02-16 | Disposition: A | Discharge status: Home / Self Care | Payer: Medicare Other | Attending: Emergency Medicine | Admitting: Emergency Medicine

## 2017-02-16 DIAGNOSIS — E1140 Type 2 diabetes mellitus with diabetic neuropathy, unspecified: Secondary | ICD-10-CM | POA: Insufficient documentation

## 2017-02-16 DIAGNOSIS — Z7982 Long term (current) use of aspirin: Secondary | ICD-10-CM | POA: Insufficient documentation

## 2017-02-16 DIAGNOSIS — Z79899 Other long term (current) drug therapy: Secondary | ICD-10-CM | POA: Diagnosis not present

## 2017-02-16 DIAGNOSIS — Z7984 Long term (current) use of oral hypoglycemic drugs: Secondary | ICD-10-CM | POA: Diagnosis not present

## 2017-02-16 DIAGNOSIS — I10 Essential (primary) hypertension: Secondary | ICD-10-CM | POA: Insufficient documentation

## 2017-02-16 DIAGNOSIS — M5431 Sciatica, right side: Secondary | ICD-10-CM | POA: Diagnosis not present

## 2017-02-16 DIAGNOSIS — M79604 Pain in right leg: Secondary | ICD-10-CM | POA: Diagnosis present

## 2017-02-16 LAB — BASIC METABOLIC PANEL
Anion gap: 13 (ref 5–15)
BUN: 24 mg/dL — ABNORMAL HIGH (ref 6–20)
CO2: 26 mmol/L (ref 22–32)
Calcium: 10.2 mg/dL (ref 8.9–10.3)
Chloride: 98 mmol/L — ABNORMAL LOW (ref 101–111)
Creatinine, Ser: 1.21 mg/dL — ABNORMAL HIGH (ref 0.44–1.00)
GFR calc Af Amer: 52 mL/min — ABNORMAL LOW (ref >60)
GFR calc non Af Amer: 45 mL/min — ABNORMAL LOW (ref >60)
Glucose, Bld: 230 mg/dL — ABNORMAL HIGH (ref 65–99)
Potassium: 3.4 mmol/L — ABNORMAL LOW (ref 3.5–5.1)
Sodium: 137 mmol/L (ref 135–145)

## 2017-02-16 LAB — CBC
HCT: 41.7 % (ref 36.0–46.0)
Hemoglobin: 13.9 g/dL (ref 12.0–15.0)
MCH: 29.6 pg (ref 26.0–34.0)
MCHC: 33.3 g/dL (ref 30.0–36.0)
MCV: 88.7 fL (ref 78.0–100.0)
Platelets: 230 10*3/uL (ref 150–400)
RBC: 4.7 MIL/uL (ref 3.87–5.11)
RDW: 13.3 % (ref 11.5–15.5)
WBC: 7.3 10*3/uL (ref 4.0–10.5)

## 2017-02-16 MED ORDER — TRAMADOL HCL 50 MG PO TABS: 50.0000 mg | ORAL_TABLET | Freq: Four times a day (QID) | ORAL | 0 refills | Status: DC | PRN

## 2017-02-16 MED ORDER — TRAMADOL HCL 50 MG PO TABS
50.0000 mg | ORAL_TABLET | Freq: Once | ORAL | Status: AC
  Administered 2017-02-16: 50 mg via ORAL
  Filled 2017-02-16: qty 1

## 2017-02-16 MED ORDER — PREDNISONE 20 MG PO TABS: ORAL_TABLET | ORAL | 0 refills | Status: DC

## 2017-02-16 MED ORDER — PREDNISONE 20 MG PO TABS
60.0000 mg | ORAL_TABLET | Freq: Once | ORAL | Status: AC
  Administered 2017-02-16: 60 mg via ORAL
  Filled 2017-02-16: qty 3

## 2017-02-16 NOTE — ED Provider Notes (Signed)
[place] DEPT Provider Note   CSN: [id number] Arrival date & time: 02/16/17  1314     History   Chief Complaint Chief Complaint  Patient presents with  . Leg Pain    HPI Cathy Gonzalez is a 71 y.o. female.  This is a morbidly obese African-American female with a history of sciatica.  She had an epidural injection on April 16, it is controlling the pain in the left.  She had a steroid injection in her right hip 2 weeks ago for pain in her right leg.  She said it was good for about a week, but has had increased pain in the posterior thigh and calf.  She states that if she takes Advil.  The pain goes away, but as the medication wears off, the pain returns.  She states she did try calling her orthopedic doctor, but he is out of town.  She did not contact her primary care physician.      Past Medical History:  Diagnosis Date  . Arthritis   . Diabetes mellitus   . Diabetic neuropathy, type II diabetes mellitus ([place])   . Hepatitis C   . Hypertension   . Hypokalemia   . Morbidly obese ([place])   . Nocturnal seizures ([place]) 05/14/2015  . OSA on CPAP     Patient Active Problem List   Diagnosis Date Noted  . Dizziness and giddiness 01/14/2016  . Hepatic cirrhosis ([place]) 07/26/2015  . Nocturnal seizures ([place]) 05/14/2015  . Chronic hepatitis C without hepatic coma ([place]) 03/14/2015  . Vertigo, central 02/11/2014  . Difficulty walking 02/11/2014  . Diabetes mellitus ([place]) 02/11/2014  . Dysphagia 02/11/2014  . OBESITY 07/26/2010  . Essential hypertension, benign 07/26/2010  . DEGENERATIVE JOINT DISEASE, GENERALIZED 07/26/2010    Past Surgical History:  Procedure Laterality Date  . ABDOMINAL HYSTERECTOMY    . CATARACT EXTRACTION Left   . COLONOSCOPY WITH PROPOFOL N/A 08/21/2015   Procedure: COLONOSCOPY WITH PROPOFOL;  Surgeon: Carol Ada, MD;  Location: [facility];  Service: Endoscopy;  Laterality: N/A;  . ESOPHAGOGASTRODUODENOSCOPY (EGD) WITH PROPOFOL N/A 08/21/2015   Procedure: ESOPHAGOGASTRODUODENOSCOPY (EGD) WITH PROPOFOL;  Surgeon: Carol Ada, MD;  Location: [facility];  Service: Endoscopy;  Laterality: N/A;    OB History    No data available       Home Medications    Prior to Admission medications   Medication Sig Start Date End Date Taking? Authorizing Provider  amLODipine (NORVASC) 10 MG tablet Take 10 mg by mouth daily.    [provider]  aspirin EC 81 MG tablet Take 1 tablet (81 mg total) by mouth daily. 02/12/14   Rai, Vernelle Emerald, MD  chlorthalidone (HYGROTON) 25 MG tablet  12/10/16   [provider]  colchicine 0.6 MG tablet Take 0.6 mg by mouth daily.     [provider]  diclofenac sodium (VOLTAREN) 1 % GEL Apply 1 application topically 2 (two) times daily. Reported on 04/03/2016 07/23/15   [provider]  furosemide (LASIX) 40 MG tablet  12/03/16   [provider]  KLOR-CON M20 20 MEQ tablet  11/27/16   [provider]  lacosamide (VIMPAT) 50 MG TABS tablet Take 1 tablet (50 mg total) by mouth 2 (two) times daily. 02/09/17   Ward Givens, NP  levETIRAcetam (KEPPRA) 500 MG tablet Take 3 tablets (1,500 mg total) by mouth 2 (two) times daily. 09/25/16   Ward Givens, NP  metFORMIN (GLUCOPHAGE) 500 MG tablet Take 500 mg by mouth at bedtime.  [provider]  montelukast (SINGULAIR) 10 MG tablet Take 10 mg by mouth at bedtime.    [provider]  Potassium Chloride ER 20 MEQ TBCR  12/10/16   [provider]  predniSONE (DELTASONE) 20 MG tablet 3 Tabs PO Days 1-3, then 2 tabs PO Days 4-6, then 1 tab PO Day 7-9, then Half Tab PO Day 10-12 02/16/17   Junius Creamer, NP  pregabalin (LYRICA) 300 MG capsule Take 1 capsule (300 mg total) by mouth 2 (two) times daily. 12/19/16   Hyatt, Max T, DPM  sucralfate (CARAFATE) 1 G tablet Take 1 g by mouth 4 (four) times daily -  with meals and at bedtime.    [provider]  tiZANidine (ZANAFLEX) 4 MG tablet Take 4 mg  by mouth at bedtime.     [provider]  traMADol (ULTRAM) 50 MG tablet Take 1 tablet (50 mg total) by mouth every 6 (six) hours as needed. 02/16/17   Junius Creamer, NP  valsartan (DIOVAN) 320 MG tablet Take 320 mg by mouth daily.    [provider]    Family History Family History  Problem Relation Age of Onset  . Diabetes Sister   . Diabetes Brother   . Cancer Sister        breast  . Breast cancer Sister   . Seizures Neg Hx     Social History Social History  Substance Use Topics  . Smoking status: Never Smoker  . Smokeless tobacco: Never Used  . Alcohol use No     Allergies   Patient has no known allergies.   Review of Systems Review of Systems  Constitutional: Negative for fever.  Musculoskeletal: Positive for arthralgias. Negative for back pain and joint swelling.  Skin: Negative for wound.  Neurological: Negative for weakness.  All other systems reviewed and are negative.    Physical Exam Updated Vital Signs BP (!) 143/81 (BP Location: Right Arm)   Pulse 94   Temp 98 F (36.7 C) (Oral)   Resp 18   Ht 5\' 4"  (1.626 m)   Wt 118.8 kg   SpO2 96%   BMI 44.97 kg/m   Physical Exam  Constitutional: She appears well-developed and well-nourished.  Eyes: Pupils are equal, round, and reactive to light.  Neck: Normal range of motion.  Cardiovascular: Normal rate.   Pulmonary/Chest: Effort normal.  Musculoskeletal: She exhibits tenderness. She exhibits no edema or deformity.       Legs: Neurological: She is alert.  Skin: Skin is warm.  Psychiatric: She has a normal mood and affect.  Nursing note and vitals reviewed.    ED Treatments / Results  Labs (all labs ordered are listed, but only abnormal results are displayed) Labs Reviewed  BASIC METABOLIC PANEL - Abnormal; Notable for the following:       Result Value   Potassium 3.4 (*)    Chloride 98 (*)    Glucose, Bld 230 (*)    BUN 24 (*)    Creatinine, Ser 1.21 (*)    GFR calc non Af  Amer 45 (*)    GFR calc Af Amer 52 (*)    All other components within normal limits  CBC    EKG  EKG Interpretation  Date/Time:  Monday Feb 16 2017 13:27:36 EDT Ventricular Rate:  92 PR Interval:  140 QRS Duration: 74 QT Interval:  354 QTC Calculation: 437 R Axis:   -56 Text Interpretation:  Normal sinus rhythm Left axis deviation Low  voltage QRS Possible Anterolateral infarct , age undetermined Abnormal ECG No significant change since last tracing Confirmed by KNAPP  MD-J, JON ([id number]) on 02/16/2017 9:06:41 PM       Radiology No results found.  Procedures Procedures (including critical care time)  Medications Ordered in ED Medications  traMADol (ULTRAM) tablet 50 mg (50 mg Oral Given 02/16/17 2045)  predniSONE (DELTASONE) tablet 60 mg (60 mg Oral Given 02/16/17 2046)     Initial Impression / Assessment and Plan / ED Course  I have reviewed the triage vital signs and the nursing notes.  Pertinent labs & imaging results that were available during my care of the patient were reviewed by me and considered in my medical decision making (see chart for details).      This patient's pain is consistent with an L4-5 radiculopathy.  I will start a course of steroids. She is currently taking a muscle relaxer at night.  I will give her Ultram for increased pain control for the next 2 days with instructions to follow-up with her primary care physician if needed  Final Clinical Impressions(s) / ED Diagnoses   Final diagnoses:  Sciatica of right side    New Prescriptions New Prescriptions   PREDNISONE (DELTASONE) 20 MG TABLET    3 Tabs PO Days 1-3, then 2 tabs PO Days 4-6, then 1 tab PO Day 7-9, then Half Tab PO Day 10-12   TRAMADOL (ULTRAM) 50 MG TABLET    Take 1 tablet (50 mg total) by mouth every 6 (six) hours as needed.     Junius Creamer, NP 02/16/17 2117    Dorie Rank, MD 02/16/17 2350

## 2017-02-16 NOTE — Telephone Encounter (Signed)
Dr. Durward Fortes is on vacation and I left him a message regarding this note. SInce she did have an injection with Dr. Ernestina Patches, I thought he could help her. I spoke with pt and she stated she feels like she has pressure in her calf that is intermittent and a "fever" in her calf. Please ask Dr. Ernestina Patches is she should go to hospital to get checked out. Thanks, Marcie Bal

## 2017-02-16 NOTE — ED Triage Notes (Signed)
Pt is coming from home with complaints of pain in her left leg. Pt has Hx of sciatica and arthritis. Report "dizzy spell" when the pain got bad. Pt denies dizziness at this time.

## 2017-02-16 NOTE — Telephone Encounter (Signed)
Please see recommendation from Dr. Ernestina Patches below. Can you call patient to discuss?

## 2017-02-16 NOTE — Telephone Encounter (Signed)
Patient called to inform you she was going to go ahead and go to the hospital.

## 2017-02-16 NOTE — Telephone Encounter (Signed)
Patient calling because having a lot of calf pain now since last office visit. Injection helped with hip pain, but now having hard time walking due to calf pain. Per patient pain worse when bladder is full. Once she relieves herself calf eases some. Aleve does help a little amount. Patient request a call back to discuss. Question rx for meds. to help with pain.

## 2017-02-16 NOTE — ED Notes (Signed)
Pt stable and states understanding discharge instructions, no further questions, no signature pad due to hallway pt

## 2017-02-16 NOTE — Telephone Encounter (Signed)
Thank you for asking. I told patient initially she should go to hospital and tried to reach PW and Dr. Ernestina Patches. It worried me too about the fever in her leg.  Thank you for responding, made a good call.

## 2017-02-16 NOTE — Telephone Encounter (Signed)
OK 

## 2017-02-16 NOTE — Telephone Encounter (Signed)
Yes she needs ED or Urgent care visit or at least doppler of the leg to look for DVT especially with her saying "fever" in the calf

## 2017-02-16 NOTE — Telephone Encounter (Signed)
See below

## 2017-02-16 NOTE — Discharge Instructions (Signed)
You begin a prescription for prednisone, which is a steroid or anti-inflammatory, please take this on a regular basis as directed until all tablets have been completed.  You've also been given a prescription for Ultram for pain control, uses as needed.  He also can take Tylenol or ibuprofen.  Please make an appointment with your primary care physician if you're still having discomfort, or your orthopedic doctor

## 2017-02-17 ENCOUNTER — Telehealth (INDEPENDENT_AMBULATORY_CARE_PROVIDER_SITE_OTHER): Payer: Self-pay

## 2017-02-17 NOTE — Telephone Encounter (Signed)
Patient returned my call about her calf pain. She said she was feeling much better and I told her to call us if anything changes.

## 2017-02-17 NOTE — Telephone Encounter (Signed)
Any follow up? Please cal pt and check-thanks

## 2017-02-17 NOTE — Telephone Encounter (Signed)
thanks

## 2017-02-17 NOTE — Telephone Encounter (Signed)
She went to [facility] on Monday and report in EPIC. Dr gave her tramadol and prednisone with lab work. Tried to call patient on home and mobile numbers no answer.  LVMOM to call if she needed [place].

## 2017-02-20 ENCOUNTER — Encounter: Payer: Self-pay | Admitting: Psychology

## 2017-02-20 ENCOUNTER — Encounter: Payer: Medicare Other | Attending: Psychology | Admitting: Psychology

## 2017-02-20 DIAGNOSIS — M5116 Intervertebral disc disorders with radiculopathy, lumbar region: Secondary | ICD-10-CM

## 2017-02-20 DIAGNOSIS — E119 Type 2 diabetes mellitus without complications: Secondary | ICD-10-CM | POA: Insufficient documentation

## 2017-02-20 DIAGNOSIS — I10 Essential (primary) hypertension: Secondary | ICD-10-CM | POA: Diagnosis not present

## 2017-02-20 DIAGNOSIS — R413 Other amnesia: Secondary | ICD-10-CM | POA: Diagnosis not present

## 2017-02-20 DIAGNOSIS — M1990 Unspecified osteoarthritis, unspecified site: Secondary | ICD-10-CM | POA: Insufficient documentation

## 2017-02-20 DIAGNOSIS — G4733 Obstructive sleep apnea (adult) (pediatric): Secondary | ICD-10-CM | POA: Insufficient documentation

## 2017-02-20 DIAGNOSIS — B1920 Unspecified viral hepatitis C without hepatic coma: Secondary | ICD-10-CM | POA: Insufficient documentation

## 2017-02-20 DIAGNOSIS — F4321 Adjustment disorder with depressed mood: Secondary | ICD-10-CM | POA: Diagnosis not present

## 2017-02-20 NOTE — Progress Notes (Signed)
Today I provided feedback regarding the results of the recent neuropsychological assessment. The patient was referred because of concerns over reported memory changes and issues that her children were noticing. The patient did acknowledge some stress around her kids and anxiety playing a role in her actions at the kids were getting frustrated with. However, the results of the current neuropsychological evaluation were not consistent with any cortically mediated degenerative conditions. Below is the impressions in summary from the completed neuropsychological evaluation that I provided feedback today to the patient.  Impression/Diagnosis:                           The overall results of the current neuropsychological assessment do not suggest any deficits or indications of progressive neurocognitive incline. While we may have to perform repeat testing in a year or so to firmly determine the lack of progressive deterioration, the patient's current predicted level of functioning and current level of functioning throughout a broad range of neuropsychological areas were all in the average to low average range of functioning. There was considerable consistency across these levels of functioning. The patient did show relative strengths with regard to speed of mental operations and auditory memory but these functions were still in the average range. The patient did have some individual subtest where she had some difficulty. the most notably one measuring visual reasoning and problem solving. However, her performance was was consistent with her reference group and did not suggest significant neuropsychological deficits when taken into account with her overall performance. While I cannot say based on these data that the patient is not having seizures or other neurological issues, her level of memory and neuropsychological performance on other measures were all consistent with someone performing where we would expect her to  be performing and there is no clear evidence of neuropsychological decline.  I do think that most of the issues that her family members are noticing likely have to do with depression anxiety type symptoms. She describes a great deal of frustration around involvement her children are having in her life and I think that the patient's description of the stressors likely are playing a major role.  We have set up an appointment for me to go over the results of this current neuropsychological assessment. If you have any further questions please feel free to contact me.

## 2017-02-26 ENCOUNTER — Encounter: Payer: Self-pay | Admitting: Podiatry

## 2017-02-26 DIAGNOSIS — M130 Polyarthritis, unspecified: Secondary | ICD-10-CM | POA: Diagnosis not present

## 2017-02-26 DIAGNOSIS — E0840 Diabetes mellitus due to underlying condition with diabetic neuropathy, unspecified: Secondary | ICD-10-CM | POA: Diagnosis not present

## 2017-02-26 DIAGNOSIS — I10 Essential (primary) hypertension: Secondary | ICD-10-CM | POA: Diagnosis not present

## 2017-03-03 ENCOUNTER — Ambulatory Visit (INDEPENDENT_AMBULATORY_CARE_PROVIDER_SITE_OTHER): Payer: Medicare Other | Admitting: Orthopaedic Surgery

## 2017-03-03 ENCOUNTER — Encounter (INDEPENDENT_AMBULATORY_CARE_PROVIDER_SITE_OTHER): Payer: Self-pay | Admitting: Orthopaedic Surgery

## 2017-03-03 VITALS — BP 136/74 | HR 100 | Resp 20 | Ht 64.0 in | Wt 259.0 lb

## 2017-03-03 DIAGNOSIS — M5440 Lumbago with sciatica, unspecified side: Secondary | ICD-10-CM

## 2017-03-03 MED ORDER — TRAMADOL HCL 50 MG PO TABS: ORAL_TABLET | ORAL | 0 refills | Status: DC

## 2017-03-03 NOTE — Progress Notes (Signed)
Office Visit Note   Patient: Cathy Gonzalez           Date of Birth: [birth year]           MRN: [id number] Visit Date: 03/03/2017              Requested by: [name], MD [address] [place], [place] [address] PCP: [name], MD   Assessment & Plan: Visit Diagnoses:  1. Low back pain with sciatica, sciatica laterality unspecified, unspecified back pain laterality, unspecified chronicity   Acute exacerbation of chronic low back pain.  Plan: Tramadol for pain, evaluation Dr. Ernestina Patches for another epidural steroid injection.Cathy Gonzalez needs to continue working on her weight and exercises.[name] He is had a prior MRI scan about 2 months ago that demonstrated significant areas today and degenerative arthritis the lumbar spinous socially with areas of stenosis. She did well after cortisone injection only to have an acute exacerbation this weekend. I am hesitant to consider a Medrol Dosepak given her prediabetic status. We'll plan to see her back over the next since several weeks if no change. She does note the tramadol has helped her in the past.  Follow-Up Instructions: Return in about 1 month (around 04/03/2017).   Orders:  Orders Placed This Encounter  Procedures  . Ambulatory referral to Physical Medicine Rehab   Meds ordered this encounter  Medications  . traMADol (ULTRAM) 50 MG tablet    Sig: Take 1-2 (100mg ) by mouth every 12 (twelve) hours as needed.    Dispense:  30 tablet    Refill:  0      Procedures: No procedures performed   Clinical Data: No additional findings.   Subjective: Chief Complaint  Patient presents with  . Lower Back - Pain  . Back Pain    Pain from low back down to both knees, diffculty sitting, difficulty walking, pre-diabetic, no back surgery, no injury, works at [employer] - sits on stool, went to [facility] 02/23/17, Aleve doesn't help  Cathy. Gonzalez has experienced an acute exacerbation of chronic low back pain. He is presently having some pain  that radiates from her back to her right calf and to her left thigh. She had similar pain when Dr. Ernestina Patches injected her lumbar spine over a month ago with excellent resolution. She's had an MRI scan demonstrating areas of stenosis and diffuse degenerative changes in the lumbar spine.  HPI  Review of Systems   Objective: Vital Signs: BP 136/74 (BP Location: Right Arm, Patient Position: Sitting, Cuff Size: Normal)   Pulse 100   Resp 20   Ht 5\' 4"  (1.626 m)   Wt 259 lb (117.5 kg)   BMI 44.46 kg/m   Physical Exam  Ortho Exam  appears to be uncomfortable with the low back pain she denies any injury or trauma. As mentioned above she's had some referred pain to her right lower extremity as far distally as her calf and on the left to the left thigh. Straight leg raise was minimally positive on the right at approximately 90. I thought motor and sensory exam appeared to be intact. Painless range of motion of both hips and knees.  Specialty Comments:  No specialty comments available.  Imaging: No results found.   PMFS History: Patient Active Problem List   Diagnosis Date Noted  . Dizziness and giddiness 01/14/2016  . Hepatic cirrhosis ([place]) 07/26/2015  . Nocturnal seizures ([place]) 05/14/2015  . Chronic hepatitis C without hepatic coma ([place]) 03/14/2015  .  Vertigo, central 02/11/2014  . Difficulty walking 02/11/2014  . Diabetes mellitus ([place]) 02/11/2014  . Dysphagia 02/11/2014  . OBESITY 07/26/2010  . Essential hypertension, benign 07/26/2010  . DEGENERATIVE JOINT DISEASE, GENERALIZED 07/26/2010   Past Medical History:  Diagnosis Date  . Arthritis   . Diabetes mellitus   . Diabetic neuropathy, type II diabetes mellitus ([place])   . Hepatitis C   . Hypertension   . Hypokalemia   . Morbidly obese ([place])   . Nocturnal seizures ([place]) 05/14/2015  . OSA on CPAP     Family History  Problem Relation Age of Onset  . Diabetes Sister   . Diabetes Brother   . Cancer Sister        breast  .  Breast cancer Sister   . Seizures Neg Hx     Past Surgical History:  Procedure Laterality Date  . ABDOMINAL HYSTERECTOMY    . CATARACT EXTRACTION Left   . COLONOSCOPY WITH PROPOFOL N/A 08/21/2015   Procedure: COLONOSCOPY WITH PROPOFOL;  Surgeon: Carol Ada, MD;  Location: [facility];  Service: Endoscopy;  Laterality: N/A;  . ESOPHAGOGASTRODUODENOSCOPY (EGD) WITH PROPOFOL N/A 08/21/2015   Procedure: ESOPHAGOGASTRODUODENOSCOPY (EGD) WITH PROPOFOL;  Surgeon: Carol Ada, MD;  [facility]: [facility];  Service: Endoscopy;  Laterality: N/A;   Social History   Occupational History  . Cathy Gonzalez    Social History Main Topics  . Smoking status: Never Smoker  . Smokeless tobacco: Never Used  . Alcohol use No  . Drug use: No  . Sexual activity: No     Cathy Balding, MD   Note - This record has been created using [employer].  Chart creation errors have been sought, but may not always  have been located. Such creation errors do not reflect on  the standard of medical care.

## 2017-03-11 DIAGNOSIS — H25811 Combined forms of age-related cataract, right eye: Secondary | ICD-10-CM | POA: Diagnosis not present

## 2017-03-11 DIAGNOSIS — Z961 Presence of intraocular lens: Secondary | ICD-10-CM | POA: Diagnosis not present

## 2017-03-11 DIAGNOSIS — E119 Type 2 diabetes mellitus without complications: Secondary | ICD-10-CM | POA: Diagnosis not present

## 2017-03-16 ENCOUNTER — Encounter (INDEPENDENT_AMBULATORY_CARE_PROVIDER_SITE_OTHER): Payer: Self-pay | Admitting: Physical Medicine and Rehabilitation

## 2017-03-16 ENCOUNTER — Ambulatory Visit (INDEPENDENT_AMBULATORY_CARE_PROVIDER_SITE_OTHER): Payer: Self-pay

## 2017-03-16 ENCOUNTER — Ambulatory Visit (INDEPENDENT_AMBULATORY_CARE_PROVIDER_SITE_OTHER): Payer: Medicare Other | Admitting: Physical Medicine and Rehabilitation

## 2017-03-16 ENCOUNTER — Ambulatory Visit (INDEPENDENT_AMBULATORY_CARE_PROVIDER_SITE_OTHER): Payer: Medicare Other | Admitting: Orthopaedic Surgery

## 2017-03-16 VITALS — BP 133/79 | HR 95 | Temp 98.0°F

## 2017-03-16 DIAGNOSIS — M5416 Radiculopathy, lumbar region: Secondary | ICD-10-CM

## 2017-03-16 MED ORDER — METHYLPREDNISOLONE ACETATE 80 MG/ML IJ SUSP
80.0000 mg | Freq: Once | INTRAMUSCULAR | Status: AC
  Administered 2017-03-16: 80 mg

## 2017-03-16 MED ORDER — LIDOCAINE HCL (PF) 1 % IJ SOLN
2.0000 mL | Freq: Once | INTRAMUSCULAR | Status: AC
  Administered 2017-03-16: 2 mL

## 2017-03-16 NOTE — Progress Notes (Deleted)
Patient states she had relief with last injection for around 1 week. . Pain across back and worse on the left side. Pain radiating down left leg to knee. Constant pain. Worse with sitting. Relief with laying. Right leg to the foot and left to the knee.

## 2017-03-16 NOTE — Procedures (Signed)
Lumbar Epidural Steroid Injection - Interlaminar Approach with Fluoroscopic Guidance  Patient: Cathy Gonzalez      Date of Birth: August 10, 1946 MRN: [id number] PCP: [name], MD      Visit Date: 03/16/2017   Ms. Luthi is a 71 year old female with chronic worsening low back pain with right and left radicular pain. The right goes down the foot and the left goes to about the knee. The left side is worse. She has moderate severe to severe multifactorial stenosis at L4-5 with disc herniation at L5-S1. Prior right L5-S1 injection gave her about a week of relief until she went back to work. We are going to be a left L4-5 intralaminar injection today. If this doesn't seem to give her any long-lasting relief she probably would be a surgical candidate with the stenosis.  Universal Protocol:    Date/Time: 06/11/182:53 PM  Consent Given By: the patient  Position: PRONE  Additional Comments: Vital signs were monitored before and after the procedure. Patient was prepped and draped in the usual sterile fashion. The correct patient, procedure, and site was verified.   Injection Procedure Details:  Procedure Site One Meds Administered:  Meds ordered this encounter  Medications  . lidocaine (PF) (XYLOCAINE) 1 % injection 2 mL  . methylPREDNISolone acetate (DEPO-MEDROL) injection 80 mg     Laterality: Left  Location/Site:  L4-L5  Needle size: 20 G 4.5 inch  Needle type: Tuohy  Needle Placement: Paramedian epidural  Findings:  -Contrast Used: 1 mL iohexol 180 mg iodine/mL   -Comments: Excellent flow of contrast into the epidural space.  Procedure Details: Using a paramedian approach from the side mentioned above, the region overlying the inferior lamina was localized under fluoroscopic visualization and the soft tissues overlying this structure were infiltrated with 4 ml. of 1% Lidocaine without Epinephrine. The Tuohy needle was inserted into the epidural space using a paramedian approach.    The epidural space was localized using loss of resistance along with lateral and bi-planar fluoroscopic views.  After negative aspirate for air, blood, and CSF, a 2 ml. volume of Isovue-250 was injected into the epidural space and the flow of contrast was observed. Radiographs were obtained for documentation purposes.    The injectate was administered into the level noted above.   Additional Comments:  No complications occurred Dressing: Band-Aid    Post-procedure details: Patient was observed during the procedure. Post-procedure instructions were reviewed.  Patient left the clinic in stable condition.

## 2017-03-16 NOTE — Patient Instructions (Signed)

## 2017-03-17 DIAGNOSIS — E785 Hyperlipidemia, unspecified: Secondary | ICD-10-CM | POA: Diagnosis not present

## 2017-03-17 DIAGNOSIS — E0840 Diabetes mellitus due to underlying condition with diabetic neuropathy, unspecified: Secondary | ICD-10-CM | POA: Diagnosis not present

## 2017-03-23 ENCOUNTER — Other Ambulatory Visit: Payer: Self-pay | Admitting: Neurology

## 2017-03-24 ENCOUNTER — Encounter: Payer: Self-pay | Admitting: Podiatry

## 2017-03-24 ENCOUNTER — Telehealth: Payer: Self-pay | Admitting: Neurology

## 2017-03-24 ENCOUNTER — Ambulatory Visit (INDEPENDENT_AMBULATORY_CARE_PROVIDER_SITE_OTHER): Payer: Medicare Other | Admitting: Podiatry

## 2017-03-24 DIAGNOSIS — D3613 Benign neoplasm of peripheral nerves and autonomic nervous system of lower limb, including hip: Secondary | ICD-10-CM

## 2017-03-24 MED ORDER — LEVETIRACETAM 500 MG PO TABS: 1500.0000 mg | ORAL_TABLET | Freq: Two times a day (BID) | ORAL | 3 refills | Status: DC

## 2017-03-24 NOTE — Telephone Encounter (Signed)
E-scribed refills to pt pharmacy as requested.  

## 2017-03-24 NOTE — Progress Notes (Signed)
She presents today for her follow-up of her painful neuromas bilateral. She has a history of diabetic peripheral neuropathy and degenerative joint disease of the bilateral lower extremity. States that she is currently having nerve blocks done in her back. Objective: Pulses remain palpable she still has severe pain on palpation of the third interdigital space bilaterally with a palpable Mulder's click.  Assessment: Neuroma third interdigital space bilateral diabetic peripheral neuropathy. Degenerative joint disease.  Plan: She will continue injections which I injected her today with dehydrated alcohol third interdigital space bilaterally. She will continue limited weightbearing while at work utilizing a stool and she will also limit her hours to 5-6 hours a day. I will follow-up with her in 1 month.

## 2017-03-24 NOTE — Telephone Encounter (Signed)
Patient called office in reference to refill for levETIRAcetam (KEPPRA) 500 MG tablet.  Patient states the original prescription was sent to [facility] and she does not use mail in order anymore due to cost of prescriptions.  Patient would like to have the medication called to [facility]

## 2017-03-24 NOTE — Addendum Note (Signed)
Addended by: Hope Pigeon on: 03/24/2017 09:55 AM   Modules accepted: Orders

## 2017-03-30 ENCOUNTER — Encounter (INDEPENDENT_AMBULATORY_CARE_PROVIDER_SITE_OTHER): Payer: Self-pay | Admitting: Orthopaedic Surgery

## 2017-03-30 ENCOUNTER — Ambulatory Visit (INDEPENDENT_AMBULATORY_CARE_PROVIDER_SITE_OTHER): Payer: Medicare Other | Admitting: Orthopaedic Surgery

## 2017-03-30 VITALS — BP 101/66 | HR 68 | Resp 14 | Ht 64.0 in | Wt 255.0 lb

## 2017-03-30 DIAGNOSIS — G8929 Other chronic pain: Secondary | ICD-10-CM | POA: Diagnosis not present

## 2017-03-30 DIAGNOSIS — M545 Low back pain: Secondary | ICD-10-CM

## 2017-03-30 NOTE — Addendum Note (Signed)
Addended by: Mervyn Skeeters on: 03/30/2017 02:59 PM   Modules accepted: Orders

## 2017-03-30 NOTE — Progress Notes (Signed)
Office Visit Note   Patient: Cathy Gonzalez           Date of Birth: 10-29-1945           MRN: [id number] Visit Date: 03/30/2017              Requested by: [name], [address] [place], [place] [address] PCP: [name], MD   Assessment & Plan: Visit Diagnoses:  1. Chronic bilateral low back pain, with sciatica presence unspecified   Much better after recent cortisone injection with Dr. Ernestina Patches  Plan: Follow-up with Dr. Ernestina Patches as necessary. Physical therapy at [facility] for low back exercises. Follow up with Dr. Criss Rosales for any pain medicines  Follow-Up Instructions: Return if symptoms worsen or fail to improve.   Orders:  No orders of the defined types were placed in this encounter.  No orders of the defined types were placed in this encounter.     Procedures: No procedures performed   Clinical Data: No additional findings.   Subjective: Chief Complaint  Patient presents with  . Lower Back - Follow-up    Cathy Gonzalez is here for a F/U of Lumbar injection. She relates she is feeling a lot relief from the injection and received pain meds from her PCP. She has been put on a diet by Dr. Criss Rosales    HPI  Review of Systems   Objective: Vital Signs: BP 101/66   Pulse 68   Resp 14   Ht 5\' 4"  (1.626 m)   Wt 255 lb (115.7 kg)   BMI 43.77 kg/m   Physical Exam  Ortho Exam straight leg raise negative bilaterally. No percussible tenderness lumbar spine. Cathy Gonzalez is working on a diet with Dr. Criss Rosales.  Specialty Comments:  No specialty comments available.  Imaging: No results found.   PMFS History: Patient Active Problem List   Diagnosis Date Noted  . Dizziness and giddiness 01/14/2016  . Hepatic cirrhosis ([place]) 07/26/2015  . Nocturnal seizures ([place]) 05/14/2015  . Chronic hepatitis C without hepatic coma ([place]) 03/14/2015  . Vertigo, central 02/11/2014  . Difficulty walking 02/11/2014  . Diabetes mellitus ([place]) 02/11/2014  . Dysphagia 02/11/2014  .  OBESITY 07/26/2010  . Essential hypertension, benign 07/26/2010  . DEGENERATIVE JOINT DISEASE, GENERALIZED 07/26/2010   Past Medical History:  Diagnosis Date  . Arthritis   . Diabetes mellitus   . Diabetic neuropathy, type II diabetes mellitus ([place])   . Hepatitis C   . Hypertension   . Hypokalemia   . Morbidly obese ([place])   . Nocturnal seizures ([place]) 05/14/2015  . OSA on CPAP     Family History  Problem Relation Age of Onset  . Diabetes Sister   . Diabetes Brother   . Cancer Sister        breast  . Breast cancer Sister   . Seizures Neg Hx     Past Surgical History:  Procedure Laterality Date  . ABDOMINAL HYSTERECTOMY    . CATARACT EXTRACTION Left   . COLONOSCOPY WITH PROPOFOL N/A 08/21/2015   Procedure: COLONOSCOPY WITH PROPOFOL;  Surgeon: Carol Ada, MD;  Location: [facility];  Service: Endoscopy;  Laterality: N/A;  . ESOPHAGOGASTRODUODENOSCOPY (EGD) WITH PROPOFOL N/A 08/21/2015   Procedure: ESOPHAGOGASTRODUODENOSCOPY (EGD) WITH PROPOFOL;  Surgeon: Carol Ada, MD;  Location: [facility];  Service: Endoscopy;  Laterality: N/A;   Social History   Occupational History  . [name]    Social History Main Topics  . Smoking status:  Never Smoker  . Smokeless tobacco: Never Used  . Alcohol use No  . Drug use: No  . Sexual activity: No     Garald Balding, MD   Note - This record has been created using [employer].  Chart creation errors have been sought, but may not always  have been located. Such creation errors do not reflect on  the standard of medical care.

## 2017-03-31 DIAGNOSIS — M545 Low back pain: Secondary | ICD-10-CM | POA: Diagnosis not present

## 2017-03-31 DIAGNOSIS — E119 Type 2 diabetes mellitus without complications: Secondary | ICD-10-CM | POA: Diagnosis not present

## 2017-04-01 ENCOUNTER — Encounter: Payer: Self-pay | Admitting: Physical Therapy

## 2017-04-01 ENCOUNTER — Ambulatory Visit: Payer: Medicare Other | Attending: Orthopaedic Surgery | Admitting: Physical Therapy

## 2017-04-01 DIAGNOSIS — M62830 Muscle spasm of back: Secondary | ICD-10-CM | POA: Insufficient documentation

## 2017-04-01 DIAGNOSIS — M6281 Muscle weakness (generalized): Secondary | ICD-10-CM | POA: Insufficient documentation

## 2017-04-01 DIAGNOSIS — R2689 Other abnormalities of gait and mobility: Secondary | ICD-10-CM | POA: Insufficient documentation

## 2017-04-01 DIAGNOSIS — G8929 Other chronic pain: Secondary | ICD-10-CM | POA: Insufficient documentation

## 2017-04-01 DIAGNOSIS — M545 Low back pain: Secondary | ICD-10-CM | POA: Diagnosis not present

## 2017-04-01 NOTE — Therapy (Addendum)
[place], [place], [address] Phone: [phone]   Fax:  [phone]  Physical Therapy Evaluation / Discharge Summary  Patient Details  Name: Cathy Gonzalez MRN: [id number] Date of Birth: 08-29-1946 Referring Provider: Garald Balding, MD  Encounter Date: 04/01/2017      PT End of Session - 04/01/17 1240    Visit Number 1   Number of Visits 17   Date for PT Re-Evaluation 05/13/17   Authorization Type MCR: Kx mod by 15th visit, progress note by 10th or 7/27/.   PT Start Time 1145   PT Stop Time 1231   PT Time Calculation (min) 46 min   Activity Tolerance Patient tolerated treatment well   Behavior During Therapy WFL for tasks assessed/performed      Past Medical History:  Diagnosis Date  . Arthritis   . Diabetes mellitus   . Diabetic neuropathy, type II diabetes mellitus ([place])   . Hepatitis C   . Hypertension   . Hypokalemia   . Morbidly obese ([place])   . Nocturnal seizures ([place]) 05/14/2015  . OSA on CPAP     Past Surgical History:  Procedure Laterality Date  . ABDOMINAL HYSTERECTOMY    . CATARACT EXTRACTION Left   . COLONOSCOPY WITH PROPOFOL N/A 08/21/2015   Procedure: COLONOSCOPY WITH PROPOFOL;  Surgeon: Carol Ada, MD;  [facility];  Service: Endoscopy;  Laterality: N/A;  . ESOPHAGOGASTRODUODENOSCOPY (EGD) WITH PROPOFOL N/A 08/21/2015   Procedure: ESOPHAGOGASTRODUODENOSCOPY (EGD) WITH PROPOFOL;  Surgeon: Carol Ada, MD;  [facility];  Service: Endoscopy;  Laterality: N/A;    There were no vitals filed for this visit.       Subjective Assessment - 04/01/17 1136    Subjective pt is a 71 y.o F with CC of low back pain that has been going since 2012 with no specific MOI but pt thinks it could be related to working on concrete floor and lifting. Reports pain some in the R low back but mostly in the R lateral hip. Recieved an injection inthe low back which has improve  the back pain 3 weeks ago.  reports some pain and some N/T in the R hip depends on activity and the pain will fluctuate in the low back between L/R sides. Reports feeling of the RLE want to give way but it doesn't. reports feeling off balance.     Limitations Other (comment)  sweeping with broom   How long can you sit comfortably? 15 min   How long can you stand comfortably? 5-10 min   How long can you walk comfortably? 15 min   Diagnostic tests MRI   Patient Stated Goals improve balance, eliminate pain, get legs stronger    Currently in Pain? Yes   Pain Score 2   at worst 2/10   Pain Location Back   Pain Orientation Left   Pain Descriptors / Indicators Sore;Throbbing   Pain Type Chronic pain   Pain Onset More than a month ago   Pain Frequency Intermittent   Aggravating Factors  prolonged standing, walking, sitting for a long period of time, getting upin the morning and moving around    Pain Relieving Factors exercise in the shower, walking, trying to move around,    Effect of Pain on Daily Activities limited endurance, limited balance            [facility] PT Assessment - 04/01/17 1137      Assessment   Medical Diagnosis Chronic  bilateral low back pain, with sciatica presence unspecified   Referring Provider Garald Balding, MD   Onset Date/Surgical Date --  2012   Hand Dominance Right   Next MD Visit --  make one PRN   Prior Therapy yes     Precautions   Precautions None     Restrictions   Weight Bearing Restrictions No     Balance Screen   Has the patient fallen in the past 6 months No   Has the patient had a decrease in activity level because of a fear of falling?  No   Is the patient reluctant to leave their home because of a fear of falling?  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=)   Vocation Requirements prolonged sitting/ standing   Leisure exercise,      Cognition   Overall Cognitive Status Within Functional Limits for tasks assessed     Observation/Other Assessments   Focus on Therapeutic Outcomes (FOTO)  60% limited  predicted 49% limited     Posture/Postural Control   Posture/Postural Control Postural limitations   Postural Limitations Rounded Shoulders;Forward head     ROM / Strength   AROM / PROM / Strength AROM;Strength     AROM   AROM Assessment Site Lumbar   Right/Left Hip Left;Right   Right/Left Knee Right;Left   Lumbar Flexion 70  reproduced symptsom into R hip   Lumbar Extension 8  soreness during motion   Lumbar - Right Side Bend 8   Lumbar - Left Side Bend 8     Strength   Strength Assessment Site Hip;Knee   Right/Left Hip Left;Right   Right Hip Flexion 4/5   Right Hip ABduction 3+/5   Right Hip ADduction 3+/5   Left Hip Flexion 4/5   Left Hip ABduction 3+/5   Left Hip ADduction 3+/5   Right/Left Knee Right;Left   Right Knee Flexion 4+/5   Right Knee Extension 4+/5   Left Knee Flexion 5/5   Left Knee Extension 5/5     Palpation   Spinal mobility L1-L5 PA PAIVM    Palpation comment TTP on the L PSIS, and the R lumbar paraspinals, R glute med and piriformis soreness     Special Tests    Special Tests Lumbar   Lumbar Tests Prone Knee Bend Test;Slump Test;Straight Leg Raise     Slump test   Findings Negative     Prone Knee Bend Test   Findings Positive   Side Left   Comment bil hip tightness     Ambulation/Gait   Gait Pattern Step-through pattern;Decreased stride length;Antalgic;Trendelenburg;Trunk flexed;Decreased trunk rotation            Objective measurements completed on examination: See above findings.          [facility] Adult PT Treatment/Exercise - 04/01/17 1137      Lumbar Exercises: Stretches   Prone on  Elbows Stretch --  3 x 15   Piriformis Stretch 2 reps;30 seconds     Manual Therapy   Manual therapy comments manual trigger point release over the R piriformis x 2  PT Education - 04/01/17 1239    Education provided Yes   Education Details evaluation findings, POC, goals, HEP with proper form/ rationale, anatomy of hip/ low back.   Person(s) Educated Patient   Methods Explanation;Verbal cues;Handout;Demonstration   Comprehension Verbalized understanding;Verbal cues required;Returned demonstration          PT Short Term Goals - 04/01/17 1246      PT SHORT TERM GOAL #1   Title pt to be I with initial HEP (05/01/2017)   Time 4   Period Weeks   Status New     PT SHORT TERM GOAL #2   Title pt to verbalize and demo proper posture and lifting/ carrying mechanics to prevent and reduce low back pain (05/01/2017)   Time 4   Period Weeks   Status New     PT SHORT TERM GOAL #3   Title pt to reduce trunk muscle tightness/ spasm to reduce pain and promote trunk mobility  (05/01/2017)   Time 4   Period Weeks   Status New     PT SHORT TERM GOAL #4   Title assess berg balance and write goals based on assessment (05/01/2017)   Time 4   Period Weeks   Status New           PT Long Term Goals - 04/01/17 1252      PT LONG TERM GOAL #1   Title pt to improve trunk mobility by >/=10 degrees in all planes for functional mobility with </=1/10 pain (05/13/2017)   Time 8   Period Weeks   Status New     PT LONG TERM GOAL #2   Title pt to improve bil hip extensor/ abductor strength to >/ 4/5 with </=1/10 pain during testing to promote hip stability and assist with posture during lifting and carrying activities (05/13/2017)   Time 8   Period Weeks   Status New     PT LONG TERM GOAL #3   Title pt to be able to walk/ stand for >/= 30 min with </= 1/10 pain for functional endurance required for work related tasks and ADLs (05/13/2017)   Time 8   Period Weeks   Status  New     PT LONG TERM GOAL #4   Title Increase FOTO score to </= 49% limited to demo improvement in function (05/13/2017)   Time 8   Period Weeks   Status New     PT LONG TERM GOAL #5   Title pt to be I with all HEP given as of HEP (05/13/2017)   Time 8   Period Weeks   Status New                Plan - 04/01/17 1244    Clinical Impression Statement pt presents to OPPT with CC of low back pain that started in 2012 with non-traumatic onset, she recieved an injection 3 weeks ago significant reduced the pain. mild limitations in trunk mobillity with soreness at end ranges. weakness in bil LE with L>R.  TTP at the L PSIS and R lumbar paraspinals and piriformis with L1-L5 PAIVM hypomobility. following prone extension and manual trigger point release she reported relief of pain and tightness. she would benefit from physical therapy to decrease back pain, improve strength, promote mobility and maxmize her function by addressing the deficits listed.    Clinical Presentation Stable   Clinical Decision Making Low   Rehab Potential Good   PT Frequency 2x / week  PT Duration 8 weeks   PT Treatment/Interventions ADLs/Self Care Home Management;Electrical Stimulation;Iontophoresis 75m/ml Dexamethasone;Cryotherapy;Moist Heat;Therapeutic activities;Therapeutic exercise;Ultrasound;Manual techniques;Dry needling;Taping;Balance training;Patient/family education;Passive range of motion;Functional mobility training   PT Next Visit Plan assess/review HEP and update PRN, BERG balance assessment, prone on elbows with progression, hip/ core strengthening, modalities PRN   PT Home Exercise Plan prone on elbows, piriformis stretch, hamstring stretching, posterior pelvic tilt, lower trunk rotation   Consulted and Agree with Plan of Care Patient      Patient will benefit from skilled therapeutic intervention in order to improve the following deficits and impairments:  Abnormal gait, Pain, Obesity, Improper body  mechanics, Postural dysfunction, Decreased endurance, Decreased activity tolerance, Decreased balance, Increased fascial restricitons, Decreased strength  Visit Diagnosis: Chronic bilateral low back pain, with sciatica presence unspecified - Plan: PT plan of care cert/re-cert  Muscle weakness (generalized) - Plan: PT plan of care cert/re-cert  Other abnormalities of gait and mobility - Plan: PT plan of care cert/re-cert  Muscle spasm of back - Plan: PT plan of care cert/re-cert      G-Codes - [id number]/07/571304    Functional Assessment Tool Used (Outpatient Only) clinical judgement/ FOTO   Functional Limitation Changing and maintaining body position   Changing and Maintaining Body Position Current Status ((B2256 At least 40 percent but less than 60 percent impaired, limited or restricted   Changing and Maintaining Body Position Goal Status ((H2091 At least 20 percent but less than 40 percent impaired, limited or restricted       Problem List Patient Active Problem List   Diagnosis Date Noted  . Dizziness and giddiness 01/14/2016  . Hepatic cirrhosis ([place] 07/26/2015  . Nocturnal seizures ([place] 05/14/2015  . Chronic hepatitis C without hepatic coma ([facility] 03/14/2015  . Vertigo, central 02/11/2014  . Difficulty walking 02/11/2014  . Diabetes mellitus ([place] 02/11/2014  . Dysphagia 02/11/2014  . OBESITY 07/26/2010  . Essential hypertension, benign 07/26/2010  . DEGENERATIVE JOINT DISEASE, GENERALIZED 07/26/2010   KStarr LakePT, DPT, LAT, ATC  04/01/17  1:07 PM      [facility] [address]Phone: [phone]  Fax:  [phone] Name: EAleyza SalmiMRN: [id number] of Birth: [birth year]     PHYSICAL THERAPY DISCHARGE SUMMARY  Visits from Start of Care: 1  Current functional level related to goals / functional outcomes: See goals   Remaining deficits: unknown   Education /  Equipment: HEP  Plan: Patient agrees to discharge.  Patient goals were not met. Patient is being discharged due to not returning since the last visit.  ?????     Emileo Semel PT, DPT, LAT, ATC  05/21/17  10:41 AM

## 2017-04-13 ENCOUNTER — Ambulatory Visit: Payer: Medicare Other | Admitting: Physical Therapy

## 2017-04-13 DIAGNOSIS — H2511 Age-related nuclear cataract, right eye: Secondary | ICD-10-CM | POA: Diagnosis not present

## 2017-04-13 DIAGNOSIS — H25011 Cortical age-related cataract, right eye: Secondary | ICD-10-CM | POA: Diagnosis not present

## 2017-04-18 ENCOUNTER — Other Ambulatory Visit: Payer: Self-pay | Admitting: Neurology

## 2017-04-20 ENCOUNTER — Telehealth: Payer: Self-pay | Admitting: Neurology

## 2017-04-20 NOTE — Telephone Encounter (Signed)
I called the patient. The patient indicates that the [place] is too expensive, this would cost her about $42 for a 90 day supply. I am not sure that I can find another medication cheaper than this.  She is welcome to contact other pharmacies to see if she can obtain this medication at a lower cost. I will be happy to call this into another pharmacy.

## 2017-04-20 NOTE — Telephone Encounter (Signed)
Patient calling stating levETIRAcetam (KEPPRA) 500 MG tablet is too expensive. Is there another medication he can take? Patient uses Paediatric nurse on [address]

## 2017-04-20 NOTE — Telephone Encounter (Signed)
The patient called back,  she indicates that she has plenty of the Keppra, I'm not sure what medication she was talking about that was too expensive, she has not picked up the Vimpat because of this. She got the Keppra prescription 3 weeks ago for $42 for a 90 day supply.

## 2017-04-20 NOTE — Telephone Encounter (Signed)
Called pt pharmacy. Cost of Keppra for 90 day supply when she last picked up rx on 03/26/17 was $42.62

## 2017-04-23 ENCOUNTER — Ambulatory Visit: Payer: Medicare Other | Admitting: Podiatry

## 2017-05-11 DIAGNOSIS — E119 Type 2 diabetes mellitus without complications: Secondary | ICD-10-CM | POA: Diagnosis not present

## 2017-05-11 DIAGNOSIS — M545 Low back pain: Secondary | ICD-10-CM | POA: Diagnosis not present

## 2017-05-12 ENCOUNTER — Ambulatory Visit: Payer: Medicare Other | Admitting: Podiatry

## 2017-05-18 DIAGNOSIS — M130 Polyarthritis, unspecified: Secondary | ICD-10-CM | POA: Diagnosis not present

## 2017-05-18 DIAGNOSIS — M71551 Other bursitis, not elsewhere classified, right hip: Secondary | ICD-10-CM | POA: Diagnosis not present

## 2017-06-01 DIAGNOSIS — M130 Polyarthritis, unspecified: Secondary | ICD-10-CM | POA: Diagnosis not present

## 2017-06-01 DIAGNOSIS — E0840 Diabetes mellitus due to underlying condition with diabetic neuropathy, unspecified: Secondary | ICD-10-CM | POA: Diagnosis not present

## 2017-06-01 DIAGNOSIS — E785 Hyperlipidemia, unspecified: Secondary | ICD-10-CM | POA: Diagnosis not present

## 2017-06-02 ENCOUNTER — Telehealth (INDEPENDENT_AMBULATORY_CARE_PROVIDER_SITE_OTHER): Payer: Self-pay | Admitting: Orthopaedic Surgery

## 2017-06-02 ENCOUNTER — Telehealth (INDEPENDENT_AMBULATORY_CARE_PROVIDER_SITE_OTHER): Payer: Self-pay | Admitting: Physical Medicine and Rehabilitation

## 2017-06-02 NOTE — Telephone Encounter (Signed)
Patient requesting another referral for a second injection with Dr. Ernestina Patches. Please call to advise patient.

## 2017-06-02 NOTE — Telephone Encounter (Signed)
Told pt to make appt with Dr/ newton herself

## 2017-06-03 NOTE — Telephone Encounter (Signed)
Repeat last

## 2017-06-03 NOTE — Telephone Encounter (Signed)
Left message for patient

## 2017-06-23 DIAGNOSIS — J069 Acute upper respiratory infection, unspecified: Secondary | ICD-10-CM | POA: Diagnosis not present

## 2017-06-23 DIAGNOSIS — E118 Type 2 diabetes mellitus with unspecified complications: Secondary | ICD-10-CM | POA: Diagnosis not present

## 2017-07-08 DIAGNOSIS — E118 Type 2 diabetes mellitus with unspecified complications: Secondary | ICD-10-CM | POA: Diagnosis not present

## 2017-07-08 DIAGNOSIS — J069 Acute upper respiratory infection, unspecified: Secondary | ICD-10-CM | POA: Diagnosis not present

## 2017-07-13 DIAGNOSIS — M130 Polyarthritis, unspecified: Secondary | ICD-10-CM | POA: Diagnosis not present

## 2017-07-13 DIAGNOSIS — E0800 Diabetes mellitus due to underlying condition with hyperosmolarity without nonketotic hyperglycemic-hyperosmolar coma (NKHHC): Secondary | ICD-10-CM | POA: Diagnosis not present

## 2017-07-13 DIAGNOSIS — E785 Hyperlipidemia, unspecified: Secondary | ICD-10-CM | POA: Diagnosis not present

## 2017-07-23 NOTE — Telephone Encounter (Signed)
Patient has not returned call. Closing call and will await call back from patient.

## 2017-07-27 DIAGNOSIS — E118 Type 2 diabetes mellitus with unspecified complications: Secondary | ICD-10-CM | POA: Diagnosis not present

## 2017-07-27 DIAGNOSIS — I10 Essential (primary) hypertension: Secondary | ICD-10-CM | POA: Diagnosis not present

## 2017-07-27 DIAGNOSIS — Z23 Encounter for immunization: Secondary | ICD-10-CM | POA: Diagnosis not present

## 2017-08-13 ENCOUNTER — Ambulatory Visit: Payer: Medicare Other | Admitting: Neurology

## 2017-08-26 ENCOUNTER — Telehealth: Payer: Self-pay | Admitting: *Deleted

## 2017-08-26 DIAGNOSIS — E118 Type 2 diabetes mellitus with unspecified complications: Secondary | ICD-10-CM | POA: Diagnosis not present

## 2017-08-26 DIAGNOSIS — I10 Essential (primary) hypertension: Secondary | ICD-10-CM | POA: Diagnosis not present

## 2017-08-26 MED ORDER — PREGABALIN 300 MG PO CAPS: 300.0000 mg | ORAL_CAPSULE | Freq: Two times a day (BID) | ORAL | 0 refills | Status: DC

## 2017-08-26 NOTE — Telephone Encounter (Signed)
Refill request Lyrica. Dr. Milinda Pointer states refill once and pt needs an appt to discuss how she is taking the Lyrica. Faxed rx to [facility].

## 2017-09-01 ENCOUNTER — Ambulatory Visit: Payer: Medicare Other | Admitting: Podiatry

## 2017-09-03 ENCOUNTER — Ambulatory Visit: Payer: Medicare Other | Admitting: Podiatry

## 2017-09-03 ENCOUNTER — Encounter: Payer: Self-pay | Admitting: Podiatry

## 2017-09-03 DIAGNOSIS — M722 Plantar fascial fibromatosis: Secondary | ICD-10-CM

## 2017-09-05 NOTE — Progress Notes (Signed)
She presents today with a chief complaint of pain to the forefoot bilaterally.  Objective: Vital signs are stable alert and oriented 3. Pulses are palpable. Pain on palpation to the third interdigital space of the bilateral foot. Palpable Mulder's click third interdigital space bilateral. This is where the majority of her pain is located. She has pain on palpation medial calcaneal tubercles of bilateral heels.  Assessment: Neuroma third interdigital space bilateral. Plantar fasciitis bilateral.  Plan: We discussed the etiology pathology conservative versus surgical therapies. I previously injected the neuromas bilaterally. But we decided today after thorough discussion to inject the bilateral heels. These were injected after sterile Betadine skin prep. Total of 20 mg was injected to each foot utilizing Kenalog and 5 mg of Marcaine. He was injected to the point of maximal tenderness medial calcaneal tubercle with the plantar fascia. I will follow-up with her as needed.

## 2017-09-17 DIAGNOSIS — E785 Hyperlipidemia, unspecified: Secondary | ICD-10-CM | POA: Diagnosis not present

## 2017-09-17 DIAGNOSIS — I10 Essential (primary) hypertension: Secondary | ICD-10-CM | POA: Diagnosis not present

## 2017-09-17 DIAGNOSIS — E1140 Type 2 diabetes mellitus with diabetic neuropathy, unspecified: Secondary | ICD-10-CM | POA: Diagnosis not present

## 2017-09-30 ENCOUNTER — Other Ambulatory Visit: Payer: Self-pay | Admitting: Podiatry

## 2017-10-01 ENCOUNTER — Ambulatory Visit: Payer: Medicare Other | Admitting: Neurology

## 2017-10-01 ENCOUNTER — Encounter: Payer: Self-pay | Admitting: Neurology

## 2017-10-01 VITALS — BP 137/81 | HR 98 | Ht 64.0 in | Wt 239.5 lb

## 2017-10-01 DIAGNOSIS — G40909 Epilepsy, unspecified, not intractable, without status epilepticus: Secondary | ICD-10-CM

## 2017-10-01 DIAGNOSIS — R569 Unspecified convulsions: Secondary | ICD-10-CM

## 2017-10-01 NOTE — Progress Notes (Signed)
Reason for visit: Seizures  Cathy Gonzalez is an 71 y.o. female  History of present illness:  Cathy Gonzalez is a 71 year old right-handed black female with a history of nocturnal seizures.  The patient is on Keppra taking 1500 mg twice daily.  The patient had been placed on Vimpat when she was seen last, she could not afford the medication and stopped the drug.  The patient is also on Lyrica taking 300 mg daily for back pain.  The patient also has diabetic peripheral neuropathy.  The patient claims that since last seen she has had not had any seizures.  All of her seizures have occurred at night.  She does operate a motor vehicle without difficulty.  She has occasional soreness in the thighs, she claims that she has not had a seizure the night prior.  She generally knows that she has had a seizure when she bites her tongue.  She does have episodes of gout, she is having a gouty attack currently.  The patient is on CPAP for sleep apnea.  She has a prior history of low back pain.   Past Medical History:  Diagnosis Date  . Arthritis   . Diabetes mellitus   . Diabetic neuropathy, type II diabetes mellitus ([place])   . Hepatitis C   . Hypertension   . Hypokalemia   . Morbidly obese ([place])   . Nocturnal seizures ([place]) 05/14/2015  . OSA on CPAP     Past Surgical History:  Procedure Laterality Date  . ABDOMINAL HYSTERECTOMY    . CATARACT EXTRACTION Left   . COLONOSCOPY WITH PROPOFOL N/A 08/21/2015   Procedure: COLONOSCOPY WITH PROPOFOL;  Surgeon: Carol Ada, MD;  [facility];  Service: Endoscopy;  Laterality: N/A;  . ESOPHAGOGASTRODUODENOSCOPY (EGD) WITH PROPOFOL N/A 08/21/2015   Procedure: ESOPHAGOGASTRODUODENOSCOPY (EGD) WITH PROPOFOL;  Surgeon: Carol Ada, MD;  [facility];  Service: Endoscopy;  Laterality: N/A;    Family History  Problem Relation Age of Onset  . Diabetes Sister   . Diabetes Brother   . Cancer Sister        breast  . Breast cancer Sister   . Seizures  Neg Hx     Social history:  reports that  has never smoked. she has never used smokeless tobacco. She reports that she does not drink alcohol or use drugs.   No Known Allergies  Medications:  Prior to Admission medications   Medication Sig Start Date End Date Taking? Authorizing Provider  amLODipine (NORVASC) 10 MG tablet Take 10 mg by mouth daily.   Yes [provider]  aspirin EC 81 MG tablet Take 1 tablet (81 mg total) by mouth daily. 02/12/14  Yes Rai, Ripudeep K, MD  chlorthalidone (HYGROTON) 25 MG tablet  12/10/16  Yes [provider]  colchicine 0.6 MG tablet Take 0.6 mg by mouth daily.    Yes [provider]  diclofenac sodium (VOLTAREN) 1 % GEL Apply 1 application topically 2 (two) times daily. Reported on 04/03/2016 07/23/15  Yes [provider]  furosemide (LASIX) 40 MG tablet  12/03/16  Yes [provider]  KLOR-CON M20 20 MEQ tablet  11/27/16  Yes [provider]  levETIRAcetam (KEPPRA) 500 MG tablet Take 3 tablets (1,500 mg total) by mouth 2 (two) times daily. 03/24/17  Yes Millikan, Megan, NP  LYRICA 300 MG capsule TAKE 1 CAPSULE BY MOUTH TWICE DAILY 10/01/17  Yes Hyatt, Max T, DPM  metFORMIN (GLUCOPHAGE) 500 MG tablet Take 500 mg  by mouth at bedtime.    Yes [provider]  montelukast (SINGULAIR) 10 MG tablet Take 10 mg by mouth at bedtime.   Yes [provider]  pregabalin (LYRICA) 300 MG capsule Take 1 capsule (300 mg total) by mouth 2 (two) times daily. 12/19/16  Yes Hyatt, Max T, DPM  pregabalin (LYRICA) 300 MG capsule Take 1 capsule (300 mg total) by mouth 2 (two) times daily. 08/26/17  Yes Hyatt, Max T, DPM  sucralfate (CARAFATE) 1 G tablet Take 1 g by mouth 4 (four) times daily -  with meals and at bedtime.   Yes [provider]    ROS:  Out of a complete 14 system review of symptoms, the patient complains only of the following symptoms, and all other reviewed systems are  negative.  Drooling Blurred vision Leg swelling Restless legs, snoring Frequency of urination Joint pain, joint swelling, aching muscles Skin rash  Blood pressure 137/81, pulse 98, height 5\' 4"  (1.626 m), weight 239 lb 8 oz (108.6 kg).  Physical Exam  General: The patient is alert and cooperative at the time of the examination.  The patient is markedly obese.  Skin: No significant peripheral edema is noted.   Neurologic Exam  Mental status: The patient is alert and oriented x 3 at the time of the examination. The patient has apparent normal recent and remote memory, with an apparently normal attention span and concentration ability.   Cranial nerves: Facial symmetry is present. Speech is normal, no aphasia or dysarthria is noted. Extraocular movements are full. Visual fields are full.  Motor: The patient has good strength in all 4 extremities.  Sensory examination: Soft touch sensation is symmetric on the face, arms, and legs.  Coordination: The patient has good finger-nose-finger and heel-to-shin bilaterally.  Gait and station: The patient has a normal gait. Tandem gait is unsteady.  Romberg is negative. No drift is seen.  Reflexes: Deep tendon reflexes are symmetric.   Assessment/Plan:  1.  Nocturnal seizures  The patient is doing well with her seizure control currently.  We will continue on Keppra at the current dose.  She will follow-up in 1 year, sooner if needed.  She is to call if her seizures recur.  She claims that stress will increase the frequency of her seizures.  Jill Alexanders MD 10/01/2017 12:23 PM  [facility] [address] [place], [place] [address]  Phone [phone] Fax [phone]

## 2017-11-09 DIAGNOSIS — I10 Essential (primary) hypertension: Secondary | ICD-10-CM | POA: Diagnosis not present

## 2017-11-09 DIAGNOSIS — S0120XA Unspecified open wound of nose, initial encounter: Secondary | ICD-10-CM | POA: Diagnosis not present

## 2017-11-09 DIAGNOSIS — E0840 Diabetes mellitus due to underlying condition with diabetic neuropathy, unspecified: Secondary | ICD-10-CM | POA: Diagnosis not present

## 2017-11-09 DIAGNOSIS — E0800 Diabetes mellitus due to underlying condition with hyperosmolarity without nonketotic hyperglycemic-hyperosmolar coma (NKHHC): Secondary | ICD-10-CM | POA: Diagnosis not present

## 2017-11-09 DIAGNOSIS — E118 Type 2 diabetes mellitus with unspecified complications: Secondary | ICD-10-CM | POA: Diagnosis not present

## 2017-12-07 ENCOUNTER — Ambulatory Visit (INDEPENDENT_AMBULATORY_CARE_PROVIDER_SITE_OTHER): Payer: PPO | Admitting: Podiatry

## 2017-12-07 ENCOUNTER — Ambulatory Visit (INDEPENDENT_AMBULATORY_CARE_PROVIDER_SITE_OTHER): Payer: PPO

## 2017-12-07 ENCOUNTER — Encounter: Payer: Self-pay | Admitting: Podiatry

## 2017-12-07 DIAGNOSIS — L02611 Cutaneous abscess of right foot: Secondary | ICD-10-CM | POA: Diagnosis not present

## 2017-12-07 DIAGNOSIS — G5763 Lesion of plantar nerve, bilateral lower limbs: Secondary | ICD-10-CM | POA: Diagnosis not present

## 2017-12-07 DIAGNOSIS — S99921A Unspecified injury of right foot, initial encounter: Secondary | ICD-10-CM

## 2017-12-07 DIAGNOSIS — M722 Plantar fascial fibromatosis: Secondary | ICD-10-CM | POA: Diagnosis not present

## 2017-12-07 DIAGNOSIS — S90221A Contusion of right lesser toe(s) with damage to nail, initial encounter: Secondary | ICD-10-CM

## 2017-12-07 NOTE — Progress Notes (Signed)
HPI: 72 year old female presents the office today for multiple complaints.  Patient states that she has a history of plantar fasciitis and interdigital neuromas to the bilateral feet.  She has been treated in the past by Dr. Milinda Pointer who is given her anti-inflammatory injections which helped to alleviate the pain for several months.  She states that she does have some significant pain and tenderness to the right plantar heel.  The right is more painful and symptomatic than the left. She also presents with a new complaint today stating that she stubbed her right fifth toe approximately 2 days prior to presentation over the weekend.  She noticed significant bruising with some moderate pain associated to the right fifth toe.  She presents today for further treatment and evaluation  Past Medical History:  Diagnosis Date  . Arthritis   . Diabetes mellitus   . Diabetic neuropathy, type II diabetes mellitus ([place])   . Hepatitis C   . Hypertension   . Hypokalemia   . Morbidly obese ([place])   . Nocturnal seizures ([place]) 05/14/2015  . OSA on CPAP      Physical Exam: General: The patient is alert and oriented x3 in no acute distress.  Dermatology: Subungual hematoma with swelling underneath the nail plate noted to the right fifth toe consistent with patient's given history of a stubbing injury.  No open lesion however there is a significant amount of fluid localized around the nail plate.  No malodor.  No sign of infection noted.  Skin is warm, dry and supple bilateral lower extremities.  There is also a small focal area of hyperkeratotic tissue with a central puncture lesion noted to the right plantar heel.  Upon expression of the lesion there is a purulent drainage was expressed.  Periwound integrity is intact and there is no malodor.  Vascular: Palpable pedal pulses bilaterally. No edema or erythema noted. Capillary refill within normal limits.  Neurological: Epicritic and protective threshold absent  bilaterally.   Musculoskeletal Exam: Significant pain on palpation of the plantar medial aspect of the bilateral heels consistent with chronic plantar fasciitis.  There is also pain on palpation to the third interspace of the bilateral forefoot consistent with a Morton's neuroma.  Radiographic Exam:  Normal osseous mineralization. Joint spaces preserved. No fracture/dislocation/boney destruction.    Assessment: 1. Subungual hematoma right fifth toe secondary to stubbing injury 2.  Splinter abscess right plantar heel 3.  Plantar fasciitis bilateral 4.  Morton's neuroma third interspace bilateral   Plan of Care:  - patient was evaluated today.  X-rays reviewed today. -Today I discussed that the toenail to the right fifth digit likely needs to come off due to subungual hematoma and  edema around the nail plate.  Patient agrees.  Total temporary nail avulsion was performed to the nail plate of the right fifth toe.  Anesthesia was not utilized due to the patient being completely neuropathic.  Light debridement was performed using a tissue nipper and the nail was removed in an aseptic manner.  Light dressing was applied. -Small incision and drainage with debridement of the  splinter abscess was performed to the right plantar heel using a tissue nipper.  Excisional debridement of all the surrounding hyperkeratotic tissue as well as debridement of the nonviable tissue was performed.  Purulent drainage was expressed and the small wound was dressed with antibiotic ointment and Band-Aid.  Recommend antibiotic ointment and Band-Aid times 1 week -Injection of 0.5 cc Celestone Soluspan injection the bilateral heels as well  as into the third interspace the bilateral forefoot Morton's neuroma -Return to clinic in 2 weeks to check on right fifth toe nail avulsion and incision and drainage of the simple abscess -  -     Edrick Kins, DPM [facility]  Dr. Edrick Kins, DPM    [address], [place] [address]                Office [phone]  Fax [phone]

## 2017-12-17 ENCOUNTER — Ambulatory Visit: Payer: Medicare Other | Admitting: Podiatry

## 2017-12-28 ENCOUNTER — Ambulatory Visit: Payer: PPO | Admitting: Podiatry

## 2017-12-28 ENCOUNTER — Other Ambulatory Visit: Payer: Self-pay | Admitting: Family Medicine

## 2017-12-28 ENCOUNTER — Encounter: Payer: Self-pay | Admitting: Podiatry

## 2017-12-28 DIAGNOSIS — S90221A Contusion of right lesser toe(s) with damage to nail, initial encounter: Secondary | ICD-10-CM | POA: Diagnosis not present

## 2017-12-28 DIAGNOSIS — Z1231 Encounter for screening mammogram for malignant neoplasm of breast: Secondary | ICD-10-CM

## 2017-12-28 DIAGNOSIS — M722 Plantar fascial fibromatosis: Secondary | ICD-10-CM

## 2017-12-31 NOTE — Progress Notes (Signed)
   Subjective: 72 year old female with PMHx of T2DM presenting today for follow up evaluation of bilateral plantar fasciitis and a total temporary nail avulsion procedure of the right fifth toe three weeks ago. She reports some soreness to the heels bilaterally but reports feeling some relief after receiving the injections. She states the right fifth toe is improving as well. She denies any new complaints at this time. Patient is here for further evaluation and treatment.   Past Medical History:  Diagnosis Date  . Arthritis   . Diabetes mellitus   . Diabetic neuropathy, type II diabetes mellitus ([place])   . Hepatitis C   . Hypertension   . Hypokalemia   . Morbidly obese ([place])   . Nocturnal seizures ([place]) 05/14/2015  . OSA on CPAP      Objective: Physical Exam General: The patient is alert and oriented x3 in no acute distress.  Dermatology: Skin is warm, dry and supple. Nail bed and respective nail fold appears to be healing appropriately. Open wound to the associated nail fold with a granular wound base and moderate amount of fibrotic tissue. Minimal drainage noted.    Vascular: Dorsalis Pedis and Posterior Tibial pulses palpable bilateral.  Capillary fill time is immediate to all digits.  Neurological: Epicritic and protective threshold intact bilateral.   Musculoskeletal: Tenderness to palpation to the plantar aspect of the bilateral heels along the plantar fascia. All other joints range of motion within normal limits bilateral. Strength 5/5 in all groups bilateral.   Assessment: 1. plantar fasciitis bilateral feet, left greater than right - improved 2. Total temporary nail avulsion procedure follow up right fifth toe  Plan of Care:  1. Patient evaluated.   2. Injection of 0.5cc Celestone soluspan injected into the left heel.  3. Recommended good shoe gear. 4. Light debridement of open wound was performed to the periungual border of the respective toe using a currette and tissue  nipper. Antibiotic ointment and Band-Aid was applied. 5. Return to clinic as needed.   Edrick Kins, DPM [facility]  Dr. Edrick Kins, DPM    [address], [place] [address]                Office [phone]  Fax [phone]

## 2018-01-21 ENCOUNTER — Ambulatory Visit: Payer: PPO | Admitting: Podiatry

## 2018-01-21 ENCOUNTER — Encounter: Payer: Self-pay | Admitting: Podiatry

## 2018-01-21 DIAGNOSIS — M722 Plantar fascial fibromatosis: Secondary | ICD-10-CM

## 2018-01-21 NOTE — Progress Notes (Signed)
She presents today for follow-up of her painful feet bilaterally.  She states my blood sugars have been running almost 300.  Toes are really burning and was supposed to see my primary doctor the first of next week.  Objective: Vital signs are stable she is alert and oriented x3.  Pulses are palpable.  Neurologic sensorium is diminished per Semmes Weinstein monofilament but exquisitely tender on light touch.  Assessment: Diabetic peripheral neuropathy.  Plan: Follow-up with her primary care provider and will follow up with her in the near future for her neuropathic pain.

## 2018-01-27 ENCOUNTER — Ambulatory Visit: Payer: Medicare Other

## 2018-01-28 DIAGNOSIS — E119 Type 2 diabetes mellitus without complications: Secondary | ICD-10-CM | POA: Diagnosis not present

## 2018-01-28 DIAGNOSIS — I10 Essential (primary) hypertension: Secondary | ICD-10-CM | POA: Diagnosis not present

## 2018-01-29 ENCOUNTER — Other Ambulatory Visit: Payer: Self-pay

## 2018-01-29 NOTE — Patient Outreach (Signed)
[facility]) Care Management  01/29/2018  Cathy Gonzalez 05/06/1946 [id number]   TELEPHONE SCREENING Referral date: 01/21/18 Referral source: HTA Concierge Referral reason: medication assistance Insurance: Health team advantage Attempt #1  Telephone call to patient regarding HTA concierge referral. Unable to reach patient. HIPAA compliant voice message left with call back phone number.    PLAN: RNCM will attempt 2nd telephone call within 4 business days.  RNCM will send outreach letter to attempt contact.   Quinn Plowman RN,BSN,CCM [facility] Telephonic  [phone]

## 2018-02-01 ENCOUNTER — Other Ambulatory Visit: Payer: Self-pay | Admitting: Pharmacist

## 2018-02-01 ENCOUNTER — Other Ambulatory Visit: Payer: Self-pay

## 2018-02-01 NOTE — Patient Outreach (Signed)
[facility]) Care Management  02/01/2018  [name] [birth year] [id number]  72 year old female referred to [place] Management by HTA concierge for medication assistance with pregabalin and insulin. PMHx includes, but not limited to, HTN, seizures, hepatic cirrhosis, diabetes mellitus, obesity, and degenerative joint disease.   Per medication dispense history, patient uses Product/process development scientist and recently filled fiasp insulin and pregabalin.    Subjective:  Successful call to Cathy Gonzalez today. HIPAA identifiers verified. Cathy Gonzalez is verbally agreeable to [place] services.  She reports she is taking [place] and Fiasp insulin pens and was recently told she is in the coverage gap.  She reports she is now unable to afford the cost of the insulin as well as pregabalin.  Patient reports she uses Fiasp insulin as sliding scale 2 hours post-meals and that her CBGs today have been ~200.    Objective:  Medications Reviewed Today    Reviewed by Rudean Haskell, RPH (Pharmacist) on 02/01/18 at 1503  Med List Status: <None>  Medication Order Taking? Sig Documenting Provider Last Dose Status Informant  amLODipine (NORVASC) 10 MG tablet [id number] Yes Take 10 mg by mouth daily. [provider] Taking Active Self  aspirin EC 81 MG tablet [id number] Yes Take 1 tablet (81 mg total) by mouth daily. Mendel Corning, MD Taking Active Self           Med Note Tamala Julian, [place] Gleason   Mon Dec 03, 2015  5:45 AM)    chlorthalidone (HYGROTON) 25 MG tablet [id number] Yes Take 25 mg by mouth daily.  [provider] Taking Active Self  colchicine 0.6 MG tablet [id number] Yes Take 0.6 mg by mouth as needed (gout).  [provider] Taking Active Self  diclofenac sodium (VOLTAREN) 1 % GEL [id number] No Apply 1 application topically 2 (two) times daily. Reported on 04/03/2016 [provider] Not Taking Active Self           Med Note ([place] Aug 14, 2015  1:35  PM)    furosemide (LASIX) 40 MG tablet [id number] Yes 40 mg daily.  [provider] Taking Active   Insulin Aspart, w/Niacinamide, (FIASP) 100 UNIT/ML SOLN [id number] Yes Inject 4-15 Units into the skin 3 (three) times daily. [provider] Taking Active   insulin degludec (TRESIBA) 100 UNIT/ML SOPN FlexTouch Pen [id number] Yes Inject 22 Units into the skin daily at 10 pm. [provider] Taking Active   KLOR-CON M20 20 MEQ tablet [id number] Yes 20 mEq daily.  [provider] Taking Active   levETIRAcetam (KEPPRA) 500 MG tablet [id number] Yes Take 3 tablets (1,500 mg total) by mouth 2 (two) times daily. Ward Givens, NP Taking Active   metFORMIN (GLUCOPHAGE) 500 MG tablet [id number] Yes Take 500 mg by mouth at bedtime.  [provider] Taking Active Self  montelukast (SINGULAIR) 10 MG tablet [id number] Yes Take 10 mg by mouth at bedtime. [provider] Taking Active Self  pregabalin (LYRICA) 300 MG capsule [id number] Yes Take 1 capsule (300 mg total) by mouth 2 (two) times daily. [place], Max T, [place] Taking Active   sucralfate (CARAFATE) 1 G tablet [id number] Yes Take 1 g by mouth 4 (four) times daily as needed.  [provider] Taking Active Self  Med List Note [name] 02/10/14 2115): Uses cpap machine at 97%         Assessment:  Drugs sorted by system:  Neurologic/Psychologic: levetiracetam  Cardiovascular: amlodipine, aspirin, chlorthalidone, furosemide  Pulmonary/Allergy: singulair  Gastrointestinal: sucralfate  Endocrine:insulin aspart, insulin degludec, metformin  Topical:diclofenac gel  Pain: pregabalin  Miscellaneous: colchicine  Duplications in therapy:  none Gaps in therapy: none Medications to avoid in the elderly: none Drug interactions: none Other issues noted:  Medication assistance: Patient reports she cannot afford pregabalin or insulin therapy.   Extra Help LIS: Patient reports she  does not meet income requirements for Extra Help   Lyrica PAP: Patient preliminarily meets income requirements.  We reviewed necessary documentation for application.  Patient agreeable to apply.  Provider to contact for application is Dr. Phebe Colla.   [name] [name] PAP: Patient preliminarily meets income requirement but not TROOP.  Still requires ~$520 to spend before eligible.  Patient will reach out to me once she has meet TROOP to apply for insulins.    Sliding scale insulin:  Patient reports taking short acting insulin 2 hours post meals.  We reviewed correct administration timing for sliding scale insulin with meals.  Patient voiced understanding.   Plan: I will route patient assistance letter to pharmacy technician, Etter Sjogren, who will coordinate application process for Lyrica.  She will assist with obtaining all pertinent documents from both patient and Dr. Criss Rosales and submit application once completed.    Ralene Bathe, PharmD, [place] [phone]

## 2018-02-01 NOTE — Patient Outreach (Addendum)
[facility]) Care Management  02/01/2018  Cathy Gonzalez 1946-05-09 [id number]   TELEPHONE SCREENING Referral date: 01/21/18 Referral source: HTA Concierge Referral reason: medication assistance Insurance: Health team advantage   Telephone call to patient regarding HTA concierge referral. HIPAA verified with patient. Explained reason for call. Patient states she is in the coverage gap for 3 of her medications Lyrica, [name], and an sliding scale insulin.  Patient states she needs assistance with coverage.  Patient states she has been diabetic for approximately 1 1/2 year. Patient states she was started on insulin in 2018.  Patient reports her blood sugars range from 164 - 400's.  Patient reports her most recent A1 c is 11.  Patient reports she checks her blood sugars at least 2-3 times per day. Patient states she would like to have additional education on food choices for her diabetes.  Patient states she would like to have another CPAP machine that doesn't use water. Patient states she caused an issue with her machine while she was sleep one night and it cause the water to back up into her nose.  Patient states she [facility] from [facility].  RNCM advised patient to call [facility] and discuss  CPAP options.   Patient verbalized understanding.    PLAN; RNCM will refer patient to [place] and RN health coach.   Quinn Plowman RN,BSN,CCM [facility] Telephonic  [phone]

## 2018-02-04 ENCOUNTER — Ambulatory Visit: Payer: Self-pay

## 2018-02-08 ENCOUNTER — Other Ambulatory Visit: Payer: Self-pay | Admitting: Pharmacy Technician

## 2018-02-08 ENCOUNTER — Other Ambulatory Visit: Payer: Self-pay | Admitting: Pharmacist

## 2018-02-08 NOTE — Patient Outreach (Signed)
[facility]) Care Management  02/08/2018  Kruti Horacek [birth year] [id number]   Successful outreach call to patient, HIPAA identifiers verified. Patient states that she received her application for Lyrica. She also stated she will make a copy of her income letter and mail back to me using the envelope that I provided.  Will follow up with patient in 4-09 days with application update.  Maud Deed [place], [place] Management [phone]

## 2018-02-08 NOTE — Patient Outreach (Signed)
[place] [facility]) Care Management  02/08/2018  [name] 22-Aug-1946 [id number]  Incoming call received from [name] regarding medication assistance.  Ms. Friberg reports she received [facility] mail with application for Lyrica but had questions on what parts to fill out and what to send back to [facility].  We reviewed that patient will need to send in a copy of her ID and income records.  I will reach out to [place] technician to follow-up with patient if other application documentation required.    Ralene Bathe, PharmD, [place] [phone]

## 2018-02-09 ENCOUNTER — Ambulatory Visit: Payer: PPO | Admitting: Podiatry

## 2018-02-10 ENCOUNTER — Other Ambulatory Visit: Payer: Self-pay | Admitting: *Deleted

## 2018-02-10 ENCOUNTER — Encounter: Payer: Self-pay | Admitting: *Deleted

## 2018-02-10 NOTE — Patient Outreach (Signed)
[facility]) Care Management  02/10/2018  Cathy Gonzalez [birth year] [id number]   Cathy Gonzalez spoke with [place] Management RN Cathy Gonzalez on 02/01/18 and indicated she would like to work with a health coach to help her with chronic disease self management.  1st outreach attempt to the patient for initial assessment on home number. No answer. HIPAA compliant voicemail left with contact information.  Plan:: RN Health Coach will send unsuccessful outreach letter. RN Health Coach will make another attempt to the patientwithinfour business days.  Barrington Ellison RN,CCM,CDE [place] Management Coordinator Office Phone [phone] Office Fax [phone]

## 2018-02-12 ENCOUNTER — Ambulatory Visit
Admission: RE | Admit: 2018-02-12 | Discharge: 2018-02-12 | Disposition: A | Discharge status: Home / Self Care | Payer: PPO | Source: Ambulatory Visit | Attending: Family Medicine | Admitting: Family Medicine

## 2018-02-12 DIAGNOSIS — Z1231 Encounter for screening mammogram for malignant neoplasm of breast: Secondary | ICD-10-CM | POA: Diagnosis not present

## 2018-02-15 DIAGNOSIS — I10 Essential (primary) hypertension: Secondary | ICD-10-CM | POA: Diagnosis not present

## 2018-02-15 DIAGNOSIS — E6609 Other obesity due to excess calories: Secondary | ICD-10-CM | POA: Diagnosis not present

## 2018-02-15 DIAGNOSIS — E1165 Type 2 diabetes mellitus with hyperglycemia: Secondary | ICD-10-CM | POA: Diagnosis not present

## 2018-02-16 ENCOUNTER — Ambulatory Visit: Payer: Self-pay | Admitting: *Deleted

## 2018-02-22 ENCOUNTER — Other Ambulatory Visit: Payer: Self-pay | Admitting: *Deleted

## 2018-02-22 NOTE — Patient Outreach (Signed)
[facility] [facility]) Care Management  02/22/2018  SARAJANE FAMBROUGH Aug 26, 1946 [id number]   Attempted to reach patient via both cell phone and home number to complete initial health coach outreach and telephone assessment. Left message on both numbers requesting return call.   Barrington Ellison RN,CCM,CDE [place] Management Coordinator Office Phone [phone] Office Fax [phone]

## 2018-02-22 NOTE — Patient Outreach (Signed)
[place] [facility]) Care Management  02/22/2018  JESLIE LOWE 1946/04/04 [id number]   Mrs Hiltz returned call to this [facility] and states she is at the gym and wishes to reschedule the initial health coach telephone assessment from today at 10:00 am to tomorrow at 8:30 am. Will call her tomorrow per request. Barrington Ellison RN,CCM,CDE [place] Management Coordinator Office Phone [phone] Office Fax [phone]

## 2018-02-23 ENCOUNTER — Encounter: Payer: Self-pay | Admitting: Podiatry

## 2018-02-23 ENCOUNTER — Ambulatory Visit (INDEPENDENT_AMBULATORY_CARE_PROVIDER_SITE_OTHER): Payer: PPO | Admitting: Podiatry

## 2018-02-23 ENCOUNTER — Other Ambulatory Visit: Payer: Self-pay | Admitting: *Deleted

## 2018-02-23 ENCOUNTER — Encounter: Payer: Self-pay | Admitting: *Deleted

## 2018-02-23 DIAGNOSIS — Q828 Other specified congenital malformations of skin: Secondary | ICD-10-CM | POA: Diagnosis not present

## 2018-02-23 DIAGNOSIS — B351 Tinea unguium: Secondary | ICD-10-CM | POA: Diagnosis not present

## 2018-02-23 DIAGNOSIS — M79676 Pain in unspecified toe(s): Secondary | ICD-10-CM

## 2018-02-23 DIAGNOSIS — E1140 Type 2 diabetes mellitus with diabetic neuropathy, unspecified: Secondary | ICD-10-CM

## 2018-02-23 DIAGNOSIS — E1149 Type 2 diabetes mellitus with other diabetic neurological complication: Secondary | ICD-10-CM

## 2018-02-24 ENCOUNTER — Encounter: Payer: Self-pay | Admitting: *Deleted

## 2018-02-24 ENCOUNTER — Ambulatory Visit: Payer: PPO | Admitting: *Deleted

## 2018-02-24 NOTE — Progress Notes (Signed)
She presents today chief complaint of painful elongated toenails painful callus and would like to consider a topical pain medication for her toes and feet and she like to consider orthotics.  She also like a six-month handicap placard.  Objective: Vital signs are stable she is alert and oriented x3 pulses are palpable.  Neurologic sensorium is diminished per Semmes Weinstein monofilament.  Toenails are long thick yellow dystrophic-like mycotic painful on palpation.  She has a reactive hyperkeratosis plantar aspect of the fifth metatarsal phalangeal joint of the right foot.  No open lesions or wounds.  Assessment: Diabetic peripheral neuropathy pain in limb secondary to onychomycosis and porokeratosis.  Plan: Debridement of all reactive hyperkeratosis debridement of porokeratotic lesion as well as thick dystrophic painful mycotic nails.  Also dispensed a prescription for sure tach which we sent off for her today also provided her with a 72-month handicap placard and she will follow-up with Liliane Channel for inserts for her shoes.

## 2018-02-24 NOTE — Patient Outreach (Addendum)
[facility] [facility]) Care Management  [place]  02/23/2018   Cathy Gonzalez 12/19/1945 [id number]  Subjective: Cathy Gonzalez has agreed to a [place] [facility] health coach to assist her with Type II DM diabetes self management. She reports her most recent Hgb A1C as 11.0%.  She says some of the elevation is due to steroid injections she receives in her back and feet; the most recent injection was in her foot about a month ago. She says the last injection she received in her back was over a year ago. She says she has lost about 30 lbs in the last 6 months and wants to manage her diabetes without insulin. She says she recently started [facility] and joined the [employer].  She also reports that she has HTN, high triglycerides, neuropathy in both feet, a chronic history of gout with frequent flares, seizure disorder, and plantar fascitis.     Outpatient Encounter Medications as of 02/23/2018  Medication Sig Note  . amLODipine (NORVASC) 10 MG tablet Take 10 mg by mouth daily.   [name] aspirin EC 81 MG tablet Take 1 tablet (81 mg total) by mouth daily.   . chlorthalidone (HYGROTON) 25 MG tablet Take 25 mg by mouth daily.    . colchicine 0.6 MG tablet Take 0.6 mg by mouth as needed (gout).    [name] diclofenac sodium (VOLTAREN) 1 % GEL Apply 1 application topically 2 (two) times daily. Reported on 04/03/2016   . ezetimibe (ZETIA) 10 MG tablet    . FIASP FLEXTOUCH 100 UNIT/ML SOPN Inject 4-15 Units into the skin 3 (three) times daily with meals.    . furosemide (LASIX) 40 MG tablet 40 mg daily.    . insulin glargine (LANTUS) 100 UNIT/ML injection Inject 22 Units into the skin at bedtime. 02/23/2018: In place of Tresiba  . KLOR-CON M20 20 MEQ tablet 20 mEq daily.    [name] levETIRAcetam (KEPPRA) 500 MG tablet Take 3 tablets (1,500 mg total) by mouth 2 (two) times daily.   . metFORMIN (GLUCOPHAGE) 500 MG tablet Take 500 mg by mouth at  bedtime.    . montelukast (SINGULAIR) 10 MG tablet Take 10 mg by mouth at bedtime.   . naproxen sodium (ALEVE) 220 MG tablet Take 220 mg by mouth 2 (two) times daily.   . ONE TOUCH ULTRA TEST test strip    . ONETOUCH DELICA LANCETS 96V MISC    . pregabalin (LYRICA) 300 MG capsule Take 1 capsule (300 mg total) by mouth 2 (two) times daily.   . sucralfate (CARAFATE) 1 G tablet Take 1 g by mouth 4 (four) times daily as needed.    .     . insulin degludec (TRESIBA) 100 UNIT/ML SOPN FlexTouch Pen Inject 22 Units into the skin daily at 10 pm.  02/23/2018: Taking Lantus in place of [place] because she cannot afford the [place] copay  . telmisartan (MICARDIS) 80 MG tablet    . [     No facility-administered encounter medications on file as of 02/23/2018.    Functional Status:  In your present state of health, do you have any difficulty performing the following activities: 02/23/2018  Hearing? N  Vision? N  Difficulty concentrating or making decisions? N  Walking or climbing stairs? N  Dressing or bathing? N  Doing errands, shopping? N  Preparing Food and eating ? N  Using the Toilet? N  In the past six months, have  you accidently leaked urine? N  Do you have problems with loss of bowel control? N  Managing your Medications? N  Managing your Finances? N  Housekeeping or managing your Housekeeping? N  Some recent data might be hidden    Fall/Depression Screening: Fall Risk  02/23/2018 10/20/2016 04/03/2016  Falls in the past year? No No No   PHQ 2/9 Scores 02/23/2018 10/20/2016 02/01/2016 01/30/2016 11/07/2015 10/11/2015 10/11/2015  PHQ - 2 Score 1 0 0 0 0 0 0    Assessment: Not meeting Hgb A1C target of <7.0% for Type II DM as evidenced by most recent Hgb A1C= 11.0%  Plan:  [facility] CM Care Plan Problem One     Most Recent Value  Care Plan Problem One  Type II DM - not meeting target Hgb A1C of 7.0% as evidenced by current Hgb A1C= 11.0%  Role Documenting the Problem One  Care Management [place] for Problem One  Active  THN Long Term Goal   In the next 90 days, patient will demonstrate improved self management of Type II DM as evidenced by improved CBG readings and improved Hgb A1C of <9.0%  THN Long Term Goal Start Date  02/23/18  Interventions for Problem One Long Term Goal  Reviewed basic pathophysiology of Type II DM, reviewed patient's understanding of diabetes self management behaviors related to: healthy eating,being active, monitoring blood sugars, taking medications, problem solving, healthy coping and reducing risks , discussed status of referral to [employer] conservator/restorer  for assistance with Lyrica and Tresiba copays  THN CM Short Term Goal #2   In the next 30 days, patient will report she is consistently eating protein at lunch meal  THN CM Short Term Goal #2 Start Date  02/23/18  Interventions for Short Term Goal #2 Reviewed 24 hour food intake,  assessed patient's understanding of Plate Method, discussed importance of protein consumption at each meal and its effect on satiation and blood sugars, advised her to include protein in lunch meal, mailed her Healthy Eating brochure     [facility] CM Care Plan Problem Two     Most Recent Value  Care Plan Problem Two  Not meeting American Diabetes Association exercise recommendations of 150 minutes per week  Role Documenting the Problem Two  Care Management Coordinator  Care Plan for Problem Two  Active  THN CM Short Term Goal #1   In the next 30 days, patient will report she is exercising for 30 minutes at least 3 times weekly  THN CM Short Term Goal #1 Start Date  02/23/18  Interventions for Short Term Goal #2   Assessed patient's weekly exercise type and frequency, discussed barriers to exercise and strategies to improve exercise adherence, discussed effect of exercise on blood sugar, discussed American Diabetes Association exercise recommendations of 150 minutes per week with two sessions of  strength training, reviewed health insurance exercise benefits     This RNCM will [place] welcome packet to member.  This RNCM to fax today's note to patient's primary  care provider.  This RNCM will contact member by phone monthly and as needed to assist with chronic disease self-management and assess member's progress toward mutually set goals.  Barrington Ellison RN,CCM,CDE [place] Management Coordinator Office Phone [phone] Office Fax [phone]

## 2018-02-25 ENCOUNTER — Encounter: Payer: Self-pay | Admitting: *Deleted

## 2018-02-25 ENCOUNTER — Other Ambulatory Visit: Payer: Self-pay | Admitting: Pharmacy Technician

## 2018-02-25 NOTE — Patient Outreach (Signed)
[place] [facility]) Care Management  02/25/2018  Cathy Gonzalez 1946-08-02 [id number]   [place] patient assistance to check status of patients Lyrica application. Spoke to [facility] who stated patient was approved as of 05/22 until 10/05/18. Patient should receive medication in the next 7-10 business days and patient [place] ID# is [id number].  Will follow up with patient in the next 7 days to see if medication has been received.  Maud Deed [place], [place] Management [phone]

## 2018-03-02 ENCOUNTER — Other Ambulatory Visit: Payer: Self-pay | Admitting: Adult Health

## 2018-03-03 DIAGNOSIS — G40001 Localization-related (focal) (partial) idiopathic epilepsy and epileptic syndromes with seizures of localized onset, not intractable, with status epilepticus: Secondary | ICD-10-CM | POA: Diagnosis not present

## 2018-03-03 DIAGNOSIS — E1165 Type 2 diabetes mellitus with hyperglycemia: Secondary | ICD-10-CM | POA: Diagnosis not present

## 2018-03-03 DIAGNOSIS — I10 Essential (primary) hypertension: Secondary | ICD-10-CM | POA: Diagnosis not present

## 2018-03-03 DIAGNOSIS — M1000 Idiopathic gout, unspecified site: Secondary | ICD-10-CM | POA: Diagnosis not present

## 2018-03-03 DIAGNOSIS — E119 Type 2 diabetes mellitus without complications: Secondary | ICD-10-CM | POA: Diagnosis not present

## 2018-03-04 ENCOUNTER — Other Ambulatory Visit: Payer: Self-pay | Admitting: Pharmacy Technician

## 2018-03-04 ENCOUNTER — Ambulatory Visit: Payer: PPO | Admitting: Orthotics

## 2018-03-04 DIAGNOSIS — E1142 Type 2 diabetes mellitus with diabetic polyneuropathy: Secondary | ICD-10-CM

## 2018-03-04 DIAGNOSIS — M722 Plantar fascial fibromatosis: Secondary | ICD-10-CM

## 2018-03-04 DIAGNOSIS — M79676 Pain in unspecified toe(s): Secondary | ICD-10-CM

## 2018-03-04 DIAGNOSIS — B351 Tinea unguium: Secondary | ICD-10-CM

## 2018-03-04 DIAGNOSIS — E1149 Type 2 diabetes mellitus with other diabetic neurological complication: Secondary | ICD-10-CM

## 2018-03-04 DIAGNOSIS — Q828 Other specified congenital malformations of skin: Secondary | ICD-10-CM

## 2018-03-04 DIAGNOSIS — E1140 Type 2 diabetes mellitus with diabetic neuropathy, unspecified: Secondary | ICD-10-CM

## 2018-03-04 DIAGNOSIS — D3613 Benign neoplasm of peripheral nerves and autonomic nervous system of lower limb, including hip: Secondary | ICD-10-CM

## 2018-03-04 NOTE — Patient Outreach (Addendum)
[facility]) Care Management  03/04/2018  EVELINA LORE 1946/07/14 [id number]   Unsuccessful outreach attempt #1, HIPAA compliant voicemail left.  Will make 2nd attempt in the next 2-3 business days if call not returned.  Maud Deed [place], [place] Management [phone]   ADDENDUM:  Patient returned call, HIPAA identifiers verified. Ms. Bardwell states that she did receive Lyrica in mail. Counseled patient on how to obtain refills.   Ms. Mervine informed me that she appears to be in the doughnut hole and is having issues with affording her two insulins.  Will route note to [place] for review.

## 2018-03-04 NOTE — Progress Notes (Signed)
Patient presents today for diabetic shoe measurement and foam casting.  Goals of diabetic shoes/inserts to offer protection from conditions secondary to DM2, offer relief from sheer forces that could lead to ulcerations, protect the foot, and offer greater stability. Patient is under supervision of [place] Physician managing patients DM2: [place]/Vita Patient has following documented conditions to qualify for diabetic shoes/inserts: L84 corns/calluses, DM2, PN Patient measured with brannock device: 10W

## 2018-03-15 ENCOUNTER — Other Ambulatory Visit: Payer: Self-pay | Admitting: *Deleted

## 2018-03-15 NOTE — Patient Outreach (Signed)
[place] [facility]) Care Management  03/15/2018  Cathy Gonzalez 10/25/1945 [id number]   Returned call to [name]. She states the reason for her call was to let this RNCM know that her home phone has been out of order for the last 2 weeks but is supposed to be fixed today.  In response to question from this RNCM . She states she had the [facility] sensor removed at Dr Fransico Setters office and she was told today via phone that her Hgb A1C was now 7.9%, significantly improved from 11.3%. Positive reinforcement given to her she was encouraged to continue her healthy lifestyle changes.  Reinforced with patient that this RNCM will call her as scheduled on 03/22/18 at 9:00 am for complete follow up assessment as part of the [place] program. Also advised her that Ralene Bathe, [place] Management pharmacist,  is scheduled to call her tomorrow at 11:30 am regarding her request for assistance with her insulins as she states she is in the doughnut hole and is having financial difficulty in affording the insulins.   Barrington Ellison RN,CCM,CDE [place] Management Coordinator Office Phone [phone] Office Fax [phone]

## 2018-03-16 ENCOUNTER — Other Ambulatory Visit: Payer: Self-pay | Admitting: Pharmacist

## 2018-03-16 NOTE — Patient Outreach (Signed)
[place] [facility]) Care Management  03/16/2018  Cathy Gonzalez 06-26-1946 [id number]  Incoming call from Ms. Privette requesting medication assistance with insulin.  HIPAA identifiers verified. Patient reports that she has been taking samples of Fiasp and Tresiba from Dr. Criss Rosales due to high cost of insulin through her insurance. She reports she has lost 30 pounds in the last month and had much better control of diabetes as evidenced by decrease in A1c to 7.2.  She has decreased her insulin usage to [place] 10 units daily and very low requirements from SSI.  Patient does not believe she has spent $500 since our last conversation regarding TROOP requirements for insulin PAPs.    Medication assistance: 3-way call placed to [place].    Pharmacy cannot give out TROOP over the phone.  Patient must go in person to pharmacy and present ID.  Pharmacy confirmed following prices for insulin: $141.50 - [name], $128 [name]  Patient will go to Pharmacy on Thursday to confirm TROOP.   I reviewed the following insulin PAP programs with patient:    Lantus PAP via [employer]: 5% TROOP  [place] / Levemir / [name] / Novolog PAP via [employer]:  $1000 TROOP  Engineer, agricultural / Humalog via [place]: $1,100 TROOP, may apply for financial hardship  Call placed to patient's provider, Dr. Criss Rosales, regarding possible substitution to Basaglar and Humalog in order to apply for PAP.   Plan: I will wait to hear back from Dr. Criss Rosales.  I will reach out to Dr. Criss Rosales later this week if I have not heard back yet.   Ralene Bathe, PharmD, [place] [phone]

## 2018-03-18 ENCOUNTER — Other Ambulatory Visit: Payer: Self-pay | Admitting: Pharmacist

## 2018-03-18 ENCOUNTER — Ambulatory Visit: Payer: Self-pay | Admitting: Pharmacist

## 2018-03-18 NOTE — Patient Outreach (Signed)
[place] [facility]) Care Management  03/18/2018  NALDA SHACKLEFORD 30-Apr-1946 [id number]   Care coordination call to Dr. Fransico Setters office to follow-up on insulin.  2nd message left regarding decision to apply for PAP with Humalog and Basaglar.  RN reports she will call me back after she discusses with Dr. Criss Rosales.   Ralene Bathe, PharmD, [place] [phone]

## 2018-03-19 ENCOUNTER — Other Ambulatory Visit: Payer: Self-pay | Admitting: Pharmacist

## 2018-03-19 ENCOUNTER — Ambulatory Visit: Payer: Self-pay | Admitting: Pharmacist

## 2018-03-19 NOTE — Patient Outreach (Signed)
[place] [facility]) Care Management  03/19/2018  RANETTA ARMACOST 1946/09/19 [id number]  3rd call attempt to Dr. Fransico Setters office regarding medication substitution for insulin for patient assistance purposes.  Message left with RN.  RN states she will call me back today if provider is willing to change medications, otherwise provider would like patient to continue on current medications and receive samples from office.   Plan: I will wait to hear back from provider.  I will follow-up next week with patient if I have not heard back.   Ralene Bathe, PharmD, [place] [phone]

## 2018-03-22 ENCOUNTER — Other Ambulatory Visit: Payer: Self-pay | Admitting: *Deleted

## 2018-03-22 NOTE — Patient Outreach (Signed)
[place] [facility]) Care Management  [place]  03/22/2018   Cathy Gonzalez [birth year] [id number]  Subjective: Monthly phone call to Cathy Gonzalez as part of the [place] Management health coach program to assist her with chronic disease issues with a focus on Type II DM diabetes self management.  She says Dr. Fransico Setters office called her and told her her A1C is now 7.9% so she is really happy with the significant improvement. She says she has an appointment with Dr Criss Rosales on 6/20 at 1:45 pm.She agrees to a follow up call from this [facility] on 6/21 to discuss the results of her provider  appointment.       Outpatient Encounter Medications as of 02/23/2018  Medication Sig Note  . amLODipine (NORVASC) 10 MG tablet Take 10 mg by mouth daily.   [name] aspirin EC 81 MG tablet Take 1 tablet (81 mg total) by mouth daily.   . chlorthalidone (HYGROTON) 25 MG tablet Take 25 mg by mouth daily.    . colchicine 0.6 MG tablet Take 0.6 mg by mouth as needed (gout).    [name] diclofenac sodium (VOLTAREN) 1 % GEL Apply 1 application topically 2 (two) times daily. Reported on 04/03/2016   . ezetimibe (ZETIA) 10 MG tablet    . FIASP FLEXTOUCH 100 UNIT/ML SOPN Inject 4-15 Units into the skin 3 (three) times daily with meals.    . furosemide (LASIX) 40 MG tablet 40 mg daily.    . insulin glargine (LANTUS) 100 UNIT/ML injection Inject 22 Units into the skin at bedtime. 02/23/2018: In place of Tresiba  . KLOR-CON M20 20 MEQ tablet 20 mEq daily.    [name] levETIRAcetam (KEPPRA) 500 MG tablet Take 3 tablets (1,500 mg total) by mouth 2 (two) times daily.   . metFORMIN (GLUCOPHAGE) 500 MG tablet Take 500 mg by mouth at bedtime.    . montelukast (SINGULAIR) 10 MG tablet Take 10 mg by mouth at bedtime.   . naproxen sodium (ALEVE) 220 MG tablet Take 220 mg by mouth 2 (two) times daily.   . ONE TOUCH ULTRA TEST test strip    . ONETOUCH DELICA LANCETS 22Q MISC    . pregabalin (LYRICA) 300 MG capsule Take 1  capsule (300 mg total) by mouth 2 (two) times daily.   . sucralfate (CARAFATE) 1 G tablet Take 1 g by mouth 4 (four) times daily as needed.    .     . insulin degludec (TRESIBA) 100 UNIT/ML SOPN FlexTouch Pen Inject 22 Units into the skin daily at 10 pm.  02/23/2018: Taking Lantus in place of [place] because she cannot afford the [place] copay  . telmisartan (MICARDIS) 80 MG tablet    . [     No facility-administered encounter medications on file as of 02/23/2018.    Functional Status:  In your present state of health, do you have any difficulty performing the following activities: 03/22/2018 02/23/2018  Hearing? N N  Vision? N N  Difficulty concentrating or making decisions? N N  Walking or climbing stairs? N N  Dressing or bathing? N N  Doing errands, shopping? N N  Preparing Food and eating ? N N  Using the Toilet? N N  In the past six months, have you accidently leaked urine? N N  Do you have problems with loss of bowel control? N N  Managing your Medications? N N  Managing your Finances? N N  Housekeeping or managing your Housekeeping? N N  Some recent data might be hidden    Fall/Depression Screening: Fall Risk  02/23/2018 10/20/2016 04/03/2016  Falls in the past year? No No No   PHQ 2/9 Scores 02/23/2018 10/20/2016 02/01/2016 01/30/2016 11/07/2015 10/11/2015 10/11/2015  PHQ - 2 Score 1 0 0 0 0 0 0    Assessment: Significant improvement in Type II diabetes self management as evidenced by improved Hgb A1C and improved self monitored CBGS.    Plan:  [facility] CM Care Plan Problem One     Most Recent Value  Care Plan Problem One  Type II DM - significant improvement Hgb  A1C now 7.9%, previously 11.0%  Role Documenting the Problem One  Care Management [place] for Problem One  Active  THN Long Term Goal   In the next 90 days, patient will demonstrate ongoing improved self management of Type II DM as evidenced by improved CBG readings and improved Hgb A1C of <8.0%  THN Long Term  Goal Start Date  03/22/18  Interventions for Problem One Long Term Goal Provided positive feedback to patient for the significant improvement in her Hgb A1C, assessed current CBG readings and frequency of hypoglycemia  and hyperglycemia, encouraged her to continue with her heathy lifestyle changes,   reviewed basic pathophysiology of Type II DM and discussed possibility that she will be able to stop one or both insulins with ongoing lifestyle changes, assessed next provider's appointment and arranged for follow up call to discuss the results of the appointment.   THN CM Short Term Goal #2   In the next 30 days, patient will report she is consistently eating protein at lunch meal  THN CM Short Term Goal #2 Start Date  03/22/18  Interventions for Short Term Goal #2 Assessed  protein consumption at each meal and its effect on satiation and blood sugars, reviewed strategies to help her with incorporating protein into her lunch meal    THN CM Care Plan Problem Two     Most Recent Value  Care Plan Problem Two  Not meeting American Diabetes Association exercise recommendations of 150 minutes per week  Role Documenting the Problem Two  Care Management [place] for Problem Two  Active  THN CM Short Term Goal #1   In the next 30 days, patient will report she is exercising for 30 minutes at least 3 times weekly  THN CM Short Term Goal #1 Start Date 03/22/2018  Interventions for Short Term Goal #2  Assessed patient's weekly exercise type and frequency, congratulated her on adherence to her exercise plan, reviewed effect of exercise on blood sugar, reviewed American Diabetes Association exercise recommendations of 150 minutes per week with two sessions of strength training     This RNCM will contact member by phone monthly and as needed to assist with chronic disease self-management and assess member's progress toward mutually set goals.  Barrington Ellison RN,CCM,CDE [place]  Management Coordinator Office Phone [phone] Office Fax [phone]

## 2018-03-25 DIAGNOSIS — I10 Essential (primary) hypertension: Secondary | ICD-10-CM | POA: Diagnosis not present

## 2018-03-25 DIAGNOSIS — E119 Type 2 diabetes mellitus without complications: Secondary | ICD-10-CM | POA: Diagnosis not present

## 2018-03-25 DIAGNOSIS — E785 Hyperlipidemia, unspecified: Secondary | ICD-10-CM | POA: Diagnosis not present

## 2018-03-26 ENCOUNTER — Other Ambulatory Visit: Payer: Self-pay | Admitting: *Deleted

## 2018-03-26 NOTE — Patient Outreach (Addendum)
[place] [facility]) Care Management  [place]  03/26/2018   Cathy Gonzalez 01/12/1946 [id number]  Subjective: Phone call to Mrs. Lun as part of the [place] Management health coach program to assist her with chronic disease issues with a focus on Type II DM diabetes self management. Purpose of this call was to receive updated on the appointment with Dr. Criss Rosales on 03/25/18.  Mrs Swing says Dr. Criss Rosales told her she has lost 3 more lbs, her BP was 109/70, and her blood work was good with a significant improvement in her Hgb A1C from 11.1% to 7.9%. Mrs. Prindle says Dr Criss Rosales wants her to aim for target A1C of 6.0%. She says there were no changes made to her medications.  Mrs. Gautier says she continues to limit her carbohydrate intake, has changed her meal planning to include a protein source at every meal, drinks only water and participates in the [place] [facility].  Will plan to call her next month on 04/26/18 at 3:00 pm as she will have seen Dr Criss Rosales earlier that day.     Outpatient Encounter Medications as of 03/26/2018  Medication Sig Note  . amLODipine (NORVASC) 10 MG tablet Take 10 mg by mouth daily.   [name] aspirin EC 81 MG tablet Take 1 tablet (81 mg total) by mouth daily.   . chlorthalidone (HYGROTON) 25 MG tablet Take 25 mg by mouth daily.    . colchicine 0.6 MG tablet Take 0.6 mg by mouth as needed (gout).    [name] diclofenac sodium (VOLTAREN) 1 % GEL Apply 1 application topically 2 (two) times daily. Reported on 04/03/2016   . ezetimibe (ZETIA) 10 MG tablet    . FIASP FLEXTOUCH 100 UNIT/ML SOPN Inject 4-15 Units into the skin 3 (three) times daily with meals.    . furosemide (LASIX) 40 MG tablet 40 mg daily.    . insulin glargine (LANTUS) 100 UNIT/ML injection Inject 22 Units into the skin at bedtime. 02/23/2018: In place of Tresiba  . KLOR-CON M20 20 MEQ tablet 20 mEq daily.    [name] levETIRAcetam (KEPPRA) 500 MG tablet Take 3 tablets (1,500 mg total)  by mouth 2 (two) times daily.   . metFORMIN (GLUCOPHAGE) 500 MG tablet Take 500 mg by mouth at bedtime.    . montelukast (SINGULAIR) 10 MG tablet Take 10 mg by mouth at bedtime.   . naproxen sodium (ALEVE) 220 MG tablet Take 220 mg by mouth 2 (two) times daily.   . ONE TOUCH ULTRA TEST test strip    . ONETOUCH DELICA LANCETS 40G MISC    . pregabalin (LYRICA) 300 MG capsule Take 1 capsule (300 mg total) by mouth 2 (two) times daily.   . sucralfate (CARAFATE) 1 G tablet Take 1 g by mouth 4 (four) times daily as needed.    .     . insulin degludec (TRESIBA) 100 UNIT/ML SOPN FlexTouch Pen Inject 22 Units into the skin daily at 10 pm.  02/23/2018: Taking Lantus in place of [place] because she cannot afford the [place] copay  . telmisartan (MICARDIS) 80 MG tablet    . [     No facility-administered encounter medications on file as of 02/23/2018.    Functional Status:  In your present state of health, do you have any difficulty performing the following activities: 03/22/2018 02/23/2018  Hearing? N N  Vision? N N  Difficulty concentrating or making decisions? N N  Walking or climbing  stairs? N N  Dressing or bathing? N N  Doing errands, shopping? N N  Preparing Food and eating ? N N  Using the Toilet? N N  In the past six months, have you accidently leaked urine? N N  Do you have problems with loss of bowel control? N N  Managing your Medications? N N  Managing your Finances? N N  Housekeeping or managing your Housekeeping? N N  Some recent data might be hidden    Fall/Depression Screening: Fall Risk  02/23/2018 10/20/2016 04/03/2016  Falls in the past year? No No No   PHQ 2/9 Scores 02/23/2018 10/20/2016 02/01/2016 01/30/2016 11/07/2015 10/11/2015 10/11/2015  PHQ - 2 Score 1 0 0 0 0 0 0    Assessment: Significant improvement in Type II diabetes self management as evidenced by improved Hgb A1C and improved self monitored CBGS.  Eating protein at every meal.   Plan:  This RNCM will contact member  by phone monthly and as needed to assist with chronic disease self-management and assess member's progress toward mutually set goals.  Barrington Ellison RN,CCM,CDE [place] Management Coordinator Office Phone [phone] Office Fax [phone]

## 2018-04-21 ENCOUNTER — Other Ambulatory Visit: Payer: Self-pay | Admitting: Pharmacist

## 2018-04-21 NOTE — Patient Outreach (Signed)
[facility]) Care Management  04/21/2018  Cathy Gonzalez [birth year] [id number]  Successful call to [name] today regarding medication assistance.  HIPAA identifiers verified.    I explained to patient that I have been unable to contact Dr. Criss Rosales regarding conversation about insulin substitutions in order to apply for a patient assistance program that can waive the out of pocket expenditure requirement.   Patient reports she has not heard from the office either.  She reports she is waiting on approval for diabetic shoes as well.  She confirms she has an appointment with Dr. Criss Rosales next week and can discuss any issue with her in person then.    Patient reports she recently picked up prescriptions for [place] and thinks she has met the $1000 TROOP for the NovoCare PAP therefore insulin substitutions no longer necessary.  She will stop by [facility] on Friday to have printout of TROOP which is required for application.  We reviewed application documents necessary including proof of income.  Patient voiced understanding. She is agreeable to start application process.   Plan: I will collaborate with [facility] pharmacy technician, Etter Sjogren, who will coordinate application process for [place] through [facility].  She will assist with obtaining all pertinent documents from both patient and Dr. Criss Rosales and submit application once completed.    Ralene Bathe, PharmD, [place] [phone]

## 2018-04-26 ENCOUNTER — Other Ambulatory Visit: Payer: Self-pay | Admitting: *Deleted

## 2018-04-26 NOTE — Patient Outreach (Signed)
[place] [facility]) Care Management  [place]  04/26/2018   DAHLIA NIFONG 1946-05-28 [id number]  Subjective: Routine Health Coach telephonic follow up.  Mrs. Bolt says she was mistaken and will see her podiatrist today but does not see Dr bland until June 29th. She says she will need eye surgery for cataract removal and will see her eye doctor tomorrow to schedule. She is asking if the eye drops will be covered so she will call this RNCM with the name of the drops after her appointment tomorrow. She reports a fasting blood sugar of 118 this morning and says she can tell if her blood sugar is too high because her feet with burn at night. She denies skin impairment on her feet. She says her podiatrist ordered diabetic shoes for her.   Objective: not applicable  Encounter Medications:  Outpatient Encounter Medications as of 04/26/2018  Medication Sig Note  . amLODipine (NORVASC) 10 MG tablet Take 10 mg by mouth daily.   [name] aspirin EC 81 MG tablet Take 1 tablet (81 mg total) by mouth daily.   . chlorthalidone (HYGROTON) 25 MG tablet Take 25 mg by mouth daily.    . colchicine 0.6 MG tablet Take 0.6 mg by mouth as needed (gout).    [name] ezetimibe (ZETIA) 10 MG tablet    . FIASP FLEXTOUCH 100 UNIT/ML SOPN Inject 4-15 Units into the skin 3 (three) times daily with meals.    . furosemide (LASIX) 40 MG tablet 40 mg daily.    . insulin degludec (TRESIBA) 100 UNIT/ML SOPN FlexTouch Pen Inject 22 Units into the skin daily at 10 pm.  03/22/2018: 10 units at 5:00 pm   . KLOR-CON M20 20 MEQ tablet 20 mEq daily.    [name] levETIRAcetam (KEPPRA) 500 MG tablet TAKE 3 TABLETS BY MOUTH TWICE DAILY   . metFORMIN (GLUCOPHAGE) 500 MG tablet Take 500 mg by mouth at bedtime.    . montelukast (SINGULAIR) 10 MG tablet Take 10 mg by mouth at bedtime.   . naproxen sodium (ALEVE) 220 MG tablet Take 220 mg by mouth 2 (two) times daily. 03/22/2018: As needed  . NONFORMULARY OR COMPOUNDED ITEM [facility]   Peripheral Neuropathy Cream- Bupivacaine 1%, Doxepin 3%, Gabapentin 6%, Pentoxifylline 3%, Topiramate 1% Apply 1-2 grams to affected area 3-4 times daily Qty. 120 gm 3 refills   . ONE TOUCH ULTRA TEST test strip    . ONETOUCH DELICA LANCETS 85O MISC    . pregabalin (LYRICA) 300 MG capsule Take 1 capsule (300 mg total) by mouth 2 (two) times daily.   . sucralfate (CARAFATE) 1 G tablet Take 1 g by mouth 4 (four) times daily as needed.    [name] telmisartan (MICARDIS) 80 MG tablet     No facility-administered encounter medications on file as of 04/26/2018.     Functional Status:  In your present state of health, do you have any difficulty performing the following activities: 03/22/2018 02/23/2018  Hearing? N N  Vision? N N  Difficulty concentrating or making decisions? N N  Walking or climbing stairs? N N  Dressing or bathing? N N  Doing errands, shopping? N N  Preparing Food and eating ? N N  Using the Toilet? N N  In the past six months, have you accidently leaked urine? N N  Do you have problems with loss of bowel control? N N  Managing your Medications? N N  Managing your Finances? N N  Housekeeping or managing your  Housekeeping? N N  Some recent data might be hidden    Fall/Depression Screening: Fall Risk  02/23/2018 10/20/2016 04/03/2016  Falls in the past year? No No No   PHQ 2/9 Scores 02/23/2018 10/20/2016 02/01/2016 01/30/2016 11/07/2015 10/11/2015 10/11/2015  PHQ - 2 Score 1 0 0 0 0 0 0    Assessment: Significant improvement in Type II diabetes self management as evidenced by improved Hgb A1C and improved self monitored CBGS.  Eating protein at every meal. Patient has questions about insurance plan benefits related to cataract surgery and diabetes shoes.    Plan:  [facility] CM Care Plan Problem One     Most Recent Value  Care Plan Problem One  Type II DM - significant improvement Hgb  A1C now 7.9%, previously 11.0%  Role Documenting the Problem One  Care Management Coordinator  Care  Plan for Problem One  Active  THN Long Term Goal   In the next 90 days, patient will demonstrate ongoing improved self management of Type II DM as evidenced by improved CBG readings and improved Hgb A1C of <8.0%  THN Long Term Goal Start Date  03/22/18  THN Long Term Goal Met Date  03/15/18  THN CM Short Term Goal #2   In the next 30 days, patient will report she is consistently eating protein at lunch meal  THN CM Short Term Goal #2 Start Date  03/22/18  [facility] CM Short Term Goal #2 Met Date  03/26/18  THN CM Short Term Goal #3  In the next 30 days, benefits related to cataract surgery, and eye drops associated with cataract surgery and diabetic shoes will be discussed with patient prior to the surgery  THN CM Short Term Goal #3 Start Date  04/26/18  Interventions for Short [place] Goal #3 reviewed diabetic shoe benefit and ambulatory outpatient surgery benefit, will discuss eye drop coverage when patient calls back with name of eye drops    THN CM Care Plan Problem Two     Most Recent Value  Care Plan Problem Two  Not meeting American Diabetes Association exercise recommendations of 150 minutes per week  Role Documenting the Problem Two  Care Management [place] for Problem Two  Active  THN CM Short Term Goal #1    In the next 30 days, patient will report she is exercising for 30 minutes at least 3 times weekly  THN CM Short Term Goal #1 Start Date  03/22/18     RNCM will continue with telephonic health coaching for chronic disease states at least quarterly.  Barrington Ellison RN,CCM,CDE [place] Management Coordinator Office Phone [phone] Office Fax [phone]

## 2018-04-27 ENCOUNTER — Ambulatory Visit: Payer: PPO | Admitting: Podiatry

## 2018-04-27 ENCOUNTER — Other Ambulatory Visit: Payer: Self-pay | Admitting: Pharmacy Technician

## 2018-04-27 ENCOUNTER — Encounter: Payer: Self-pay | Admitting: Podiatry

## 2018-04-27 ENCOUNTER — Telehealth: Payer: Self-pay | Admitting: *Deleted

## 2018-04-27 DIAGNOSIS — M722 Plantar fascial fibromatosis: Secondary | ICD-10-CM | POA: Diagnosis not present

## 2018-04-27 DIAGNOSIS — B351 Tinea unguium: Secondary | ICD-10-CM

## 2018-04-27 DIAGNOSIS — G629 Polyneuropathy, unspecified: Secondary | ICD-10-CM

## 2018-04-27 DIAGNOSIS — E1140 Type 2 diabetes mellitus with diabetic neuropathy, unspecified: Secondary | ICD-10-CM | POA: Diagnosis not present

## 2018-04-27 DIAGNOSIS — E1149 Type 2 diabetes mellitus with other diabetic neurological complication: Secondary | ICD-10-CM

## 2018-04-27 DIAGNOSIS — M79676 Pain in unspecified toe(s): Secondary | ICD-10-CM

## 2018-04-27 NOTE — Telephone Encounter (Signed)
Faxed required form, clinicals and demographics to [facility].

## 2018-04-27 NOTE — Patient Outreach (Signed)
[place] [facility]) Care Management  04/28/2018  JAILENE CUPIT [birth year] [id number]   Received Novo Nordisk patient assistance referral from [place] for [place]. Prepared patient portion to be mailed and faxed provider portion to Dr. Criss Rosales.  Will follow up with patient in 7-10 business days to confirm application has been received.  Maud Deed [place], [place] Management [phone]

## 2018-04-27 NOTE — Telephone Encounter (Signed)
-----   Message from [place] sent at 04/27/2018  1:54 PM EDT ----- Regarding: Referral to Neuro Surg Referral to Neurosurgery- Dr. Maryjean Ka for severe neuropathy bilateral

## 2018-04-28 NOTE — Progress Notes (Signed)
Presents today with bilateral feet and toes are burning.  She states that they feel like they are on fire most of the time she states that the Lyrica is not working the [place] is not working nothing seems to be making it any better.  Everything we tried has failed it just seems to take him seem to be getting worse and worse versus affecting my ability to do anything at all.  Objective: Vital signs are stable she is alert and oriented x3 moderate to severe neuropathy with loss of protective sensation and vibratory sensation.  Muscle strength seems to be weakening from previous evaluations mild digital deformities with ambulation.  No open lesions or wounds.  Toenails are long thick yellow dystrophic-like mycotic painful palpation as well as debridement.  Assessment: Pain in limb secondary onychomycosis.  Pain in limb secondary to diabetic peripheral neuropathy.  Plan: At this point I feel there is nothing more I can do for her pain I feel that we need to send her to Dr. Maryjean Ka to consider spinal stimulator.  I did did debride her toenails for her today and I will follow-up with her in the near future for regular diabetic evaluation.

## 2018-04-29 NOTE — Telephone Encounter (Signed)
Pt called for the name of the doctor she was being referred. I informed pt she had been referred to Dr. Maryjean Ka [facility] [phone].

## 2018-05-03 ENCOUNTER — Ambulatory Visit: Payer: Self-pay | Admitting: *Deleted

## 2018-05-04 ENCOUNTER — Other Ambulatory Visit: Payer: Self-pay | Admitting: *Deleted

## 2018-05-04 ENCOUNTER — Telehealth: Payer: Self-pay | Admitting: Podiatry

## 2018-05-04 DIAGNOSIS — G629 Polyneuropathy, unspecified: Secondary | ICD-10-CM

## 2018-05-04 NOTE — Telephone Encounter (Signed)
Left Message with [facility] - Raquel Sarna - Dr. Maryjean Ka' assistant stating I was checking on the status of pt's referral to Dr. Maryjean Ka.

## 2018-05-04 NOTE — Telephone Encounter (Signed)
Pt called and stated that she is unable to reach Dr. Maryjean Ka [facility] [phone]. She has left several messages and wants to know what to do next.

## 2018-05-04 NOTE — Patient Outreach (Signed)
[place] [facility]) Care Management  05/04/2018  Cathy Gonzalez 07-Dec-1945 [id number]   Left message on home number advising patient this RNCM will make another outreach later this week for telphonice disease self management assistance.   Barrington Ellison RN,CCM,CDE [place] Management Coordinator Office Phone [phone] Office Fax [phone]

## 2018-05-05 ENCOUNTER — Other Ambulatory Visit: Payer: Self-pay | Admitting: Pharmacy Technician

## 2018-05-05 ENCOUNTER — Other Ambulatory Visit: Payer: Self-pay | Admitting: *Deleted

## 2018-05-05 NOTE — Patient Outreach (Signed)
[place] [facility]) Care Management  05/05/2018  Cathy Gonzalez May 18, 1946 [id number]   Successful outreach attempt, HIPAA identifiers verified. [name] had called to confirm that she just needed to sign [employer] patient assistance applications and I confirmed. Patient states she will mail the application back in tomorrow.  Will submit to company once all documents and portions of application is received.  Maud Deed [place], [place] Management [phone]

## 2018-05-05 NOTE — Telephone Encounter (Signed)
[facility], states Dr. Maryjean Ka declined pt's referral, she is not a candidate for pain management.

## 2018-05-05 NOTE — Telephone Encounter (Signed)
Try sending her to Dr. Josefa Half or another pain specialist her neuropathy is too severe for [place] to treat.

## 2018-05-06 ENCOUNTER — Other Ambulatory Visit: Payer: Self-pay | Admitting: *Deleted

## 2018-05-06 ENCOUNTER — Ambulatory Visit: Payer: Self-pay | Admitting: *Deleted

## 2018-05-06 NOTE — Telephone Encounter (Signed)
Required referral form, clinicals and demographics faxed to Dr. Mohammed Kindle.

## 2018-05-06 NOTE — Addendum Note (Signed)
Addended by: Harriett Sine D on: 05/06/2018 10:00 AM   Modules accepted: Orders

## 2018-05-06 NOTE — Patient Outreach (Signed)
[place] [facility]) Care Management  05/06/2018  Cathy Gonzalez 1946-04-06 [id number]   Left message on [name]'s home number requesting a return call to discuss the outcome of her recent provider appointments. She is being assisted telephonically with chronic disease self management via this RNCM.  Await return call.  Barrington Ellison RN,CCM,CDE [place] Management Coordinator Office Phone [phone] Office Fax [phone]

## 2018-05-10 ENCOUNTER — Ambulatory Visit: Payer: PPO | Admitting: Orthotics

## 2018-05-10 DIAGNOSIS — E785 Hyperlipidemia, unspecified: Secondary | ICD-10-CM | POA: Diagnosis not present

## 2018-05-10 DIAGNOSIS — Z6841 Body Mass Index (BMI) 40.0 and over, adult: Secondary | ICD-10-CM | POA: Diagnosis not present

## 2018-05-10 DIAGNOSIS — E118 Type 2 diabetes mellitus with unspecified complications: Secondary | ICD-10-CM | POA: Diagnosis not present

## 2018-05-10 DIAGNOSIS — Z0000 Encounter for general adult medical examination without abnormal findings: Secondary | ICD-10-CM | POA: Diagnosis not present

## 2018-05-10 DIAGNOSIS — E1149 Type 2 diabetes mellitus with other diabetic neurological complication: Secondary | ICD-10-CM

## 2018-05-10 DIAGNOSIS — E1140 Type 2 diabetes mellitus with diabetic neuropathy, unspecified: Secondary | ICD-10-CM

## 2018-05-10 DIAGNOSIS — I10 Essential (primary) hypertension: Secondary | ICD-10-CM | POA: Diagnosis not present

## 2018-05-10 DIAGNOSIS — G629 Polyneuropathy, unspecified: Secondary | ICD-10-CM

## 2018-05-10 NOTE — Progress Notes (Signed)
Needs to be reordered in size 11 W

## 2018-05-11 ENCOUNTER — Other Ambulatory Visit: Payer: Self-pay | Admitting: *Deleted

## 2018-05-11 NOTE — Patient Outreach (Signed)
[place] [facility]) Care Management  05/11/2018  AURIAH HOLLINGS 09-09-1946 [id number]   Mrs Heidler returned call to this RNCM to provide update on recent provider appointments.  She states she has been referred to a neurologist by her podiatrist to address her bilateral foot neuropathy. She says she received a good report at Dr Fransico Setters office, her blood pressure was 112/76, her blood sugar was 90 and she had lost another 3 lbs. Provided positive reinforcement and voiced appreciation to Mrs Frank for calling with the update. She said her sister that lives in [place], [place] is dying of breast cancer and that has been a source of stress but she is trying to cope with it as best she can. She says Hospice is involved with her sister's care and she needs round the clock care but her sister refuses to go to the local Hospice Home. Will plan to call Mrs. Blass in November when she sees Dr. Criss Rosales to get update and provide chronic disease self management assistance as needed.  Barrington Ellison RN,CCM,CDE [place] Management Coordinator Office Phone [phone] Office Fax [phone]

## 2018-05-14 ENCOUNTER — Telehealth: Payer: Self-pay | Admitting: Podiatry

## 2018-05-14 NOTE — Telephone Encounter (Signed)
Pt. Wants something for pain for bunion under foot.

## 2018-05-17 ENCOUNTER — Other Ambulatory Visit: Payer: Self-pay | Admitting: Pharmacy Technician

## 2018-05-17 NOTE — Patient Outreach (Signed)
[place] [facility]) Care Management  05/17/2018  Cathy Gonzalez 1946-05-03 [id number]   Received patient portion of [employer] patient assistance applications for [place]. Faxed completed applications and required documents into [employer].  Will follow up with company in 2-3 business days to check the status of application.  Maud Deed [place], [place] Management [phone]

## 2018-05-18 DIAGNOSIS — H2511 Age-related nuclear cataract, right eye: Secondary | ICD-10-CM | POA: Diagnosis not present

## 2018-05-18 DIAGNOSIS — H25811 Combined forms of age-related cataract, right eye: Secondary | ICD-10-CM | POA: Diagnosis not present

## 2018-05-18 DIAGNOSIS — H25041 Posterior subcapsular polar age-related cataract, right eye: Secondary | ICD-10-CM | POA: Diagnosis not present

## 2018-05-18 DIAGNOSIS — H25011 Cortical age-related cataract, right eye: Secondary | ICD-10-CM | POA: Diagnosis not present

## 2018-05-18 NOTE — Telephone Encounter (Signed)
There is nothing else we can do for her pain.  We are sending her to pain management.  If she has hyperkeratoses or painful calluses then Dr. Prudence Davidson or Dr. Adah Perl can debride them.

## 2018-05-18 NOTE — Telephone Encounter (Signed)
LVM for patient to return my call regarding pain management for her feet

## 2018-05-19 ENCOUNTER — Other Ambulatory Visit: Payer: Self-pay | Admitting: Pharmacy Technician

## 2018-05-19 NOTE — Patient Outreach (Signed)
[facility]) Care Management  05/19/2018  DLYNN RANES 02/25/1946 [id number]   Follow up call to to check status of patient application for [place]. [place] states that patient does not meet the income requirement without showing documentation of a Denial letter from LIS. Also patient would need to spend an additional $36.04 to meet the OOP spending requirement.  Successful outreach call to Ms. Feinstein, HIPAA identifiers verified. Ms. Lusk states she does not remember ever receiving a letter from LIS stating whether she has been denied. Informed her that the letter would be required in order to proceed with application.   Will route note to [place] to follow up with patient in regards to L[place] application.  Maud Deed [place], [place] Management [phone]

## 2018-05-20 ENCOUNTER — Other Ambulatory Visit: Payer: Self-pay | Admitting: Pharmacist

## 2018-05-20 NOTE — Patient Outreach (Signed)
[place] [facility]) Care Management  05/20/2018  [name] 10-Jan-1946 [id number]  [place] assisting patient with medication assistance with insulin.  Novo Nordisk is requesting that patient submit Extra Help LIS denial letter to complete application process.   Call placed to Ms. Torelli today to update her on applications.  Patient agreeable to submit Extra Help application.  I assisted patient with online application and reviewed that she should receive mailed letter with denial letter in 2-4 weeks.  Patient voiced understanding.    Plan: Follow-up with patient in 3 weeks regarding Extra Help denial letter.   Ralene Bathe, PharmD, [place] [phone]

## 2018-05-24 ENCOUNTER — Ambulatory Visit (INDEPENDENT_AMBULATORY_CARE_PROVIDER_SITE_OTHER): Payer: PPO | Admitting: Orthotics

## 2018-05-24 ENCOUNTER — Telehealth: Payer: Self-pay | Admitting: Podiatry

## 2018-05-24 DIAGNOSIS — G629 Polyneuropathy, unspecified: Secondary | ICD-10-CM

## 2018-05-24 DIAGNOSIS — E1149 Type 2 diabetes mellitus with other diabetic neurological complication: Secondary | ICD-10-CM

## 2018-05-24 DIAGNOSIS — Q828 Other specified congenital malformations of skin: Secondary | ICD-10-CM | POA: Diagnosis not present

## 2018-05-24 DIAGNOSIS — D3613 Benign neoplasm of peripheral nerves and autonomic nervous system of lower limb, including hip: Secondary | ICD-10-CM | POA: Diagnosis not present

## 2018-05-24 DIAGNOSIS — M722 Plantar fascial fibromatosis: Secondary | ICD-10-CM

## 2018-05-24 DIAGNOSIS — E1140 Type 2 diabetes mellitus with diabetic neuropathy, unspecified: Secondary | ICD-10-CM | POA: Diagnosis not present

## 2018-05-24 NOTE — Telephone Encounter (Signed)
I informed pt of the referral to Dr. Primus Bravo [phone] for pain management and she states she sees Dr. Jannifer Franklin for neurology.

## 2018-05-24 NOTE — Progress Notes (Signed)

## 2018-05-31 ENCOUNTER — Telehealth: Payer: Self-pay | Admitting: Podiatry

## 2018-05-31 NOTE — Telephone Encounter (Addendum)
Dr. Primus Bravo phone number has changed to [phone]. Ann - Dr. Primus Bravo office states [phone] is the old fax, fax to [phone] and they will get her in next week. Faxed required form, clinicals and demographics to new fax number.

## 2018-05-31 NOTE — Telephone Encounter (Signed)
Pt. Said she called about the referral for pain management and they said they didn't receive a referral from [place].

## 2018-05-31 NOTE — Telephone Encounter (Signed)
I informed pt of Ann - Dr. Primus Bravo statement and she stated understanding and she has their new phone number.

## 2018-06-04 ENCOUNTER — Other Ambulatory Visit: Payer: Self-pay | Admitting: Pharmacist

## 2018-06-04 NOTE — Patient Outreach (Signed)
[place] [facility]) Care Management  06/04/2018  Cathy Gonzalez 1945/12/06 [id number]  Communication received from [place] that [name] is trying to reach me. Call placed to [name] today successfully.  Patient states she has not received a letter from [facility] regarding Extra Help Application.  I reviewed with patient that application submitted on 05/20/2018 and usually time frame is 2-4 weeks for patient to receive letter.  It has been 2 weeks since application submitted.  Patient aware to continue to watch for Extra Help decision letter in the mail and to call me when she receives it.   We reviewed that insulin PAPs require this official letter in order for her to apply.  Patient voiced understanding.   Plan: I will follow-up with patient as scheduled in 2 weeks for an update on Extra Help letter.   Ralene Bathe, PharmD, [place] [phone]

## 2018-06-14 ENCOUNTER — Encounter: Payer: Self-pay | Admitting: Pharmacy Technician

## 2018-06-14 ENCOUNTER — Ambulatory Visit: Payer: Self-pay | Admitting: Pharmacist

## 2018-06-14 DIAGNOSIS — Z6839 Body mass index (BMI) 39.0-39.9, adult: Secondary | ICD-10-CM | POA: Diagnosis not present

## 2018-06-14 DIAGNOSIS — N390 Urinary tract infection, site not specified: Secondary | ICD-10-CM | POA: Diagnosis not present

## 2018-06-21 DIAGNOSIS — N390 Urinary tract infection, site not specified: Secondary | ICD-10-CM | POA: Diagnosis not present

## 2018-07-05 ENCOUNTER — Other Ambulatory Visit: Payer: Self-pay | Admitting: Pharmacist

## 2018-07-05 ENCOUNTER — Other Ambulatory Visit: Payer: Self-pay | Admitting: Pharmacy Technician

## 2018-07-05 NOTE — Patient Outreach (Signed)
[place] [facility]) Care Management  07/06/2018  SEQUOIA WITZ 1946-09-29 [id number]   Received copy of patients LIS denial letter, faxed into [place] so that they are able to continue processing her application for [place].  Will follow up with company in 2-3 business days.  Maud Deed [place], [place] Management [phone]

## 2018-07-05 NOTE — Patient Outreach (Signed)
[place] [facility]) Care Management  07/05/2018  QUANTAVIA FRITH [birth year] [id number]  72 y.o. year old female referred to [place] for medication assistance.   Patient spoke with [facility] pharmacy technician on 06/11/2018 reporting that she had received Extra Help Denial Letter from [facility] which is needed to complete insulin PAPs.  [facility] pharmacy technician mailed patient a return envelope so she can mail copy of Extra Help Denial Letter back to [facility].  Letter has not been returned to [facility].  Phone call placed to Ms. Eakle today.  Patient reports she received return envelope but has not mailed letter back to [facility] because she thought she was going to receive additional paperwork from [facility].  I explained that I am not familiar with additional forms from [facility].  In order to finish applying for PAP with insulins, I encouraged patient to return denial letter.  Patient voiced understanding.  I explained that she can text, email, or mail letter back to me.  Patient reports she will send a picture of the letter via text to me today or tomorrow.   Plan: I will await receipt of Extra Help Denial letter from patient.   Ralene Bathe, PharmD, [place] [phone]

## 2018-07-08 ENCOUNTER — Other Ambulatory Visit: Payer: Self-pay | Admitting: Pharmacy Technician

## 2018-07-08 NOTE — Patient Outreach (Signed)
[place] [facility]) Care Management  07/08/2018  Cathy Gonzalez 1946/09/28 [id number]   Follow up call to [place] to check status of patients applications for [place]. Mickel Baas confirmed patient has been approved as of 10/03 until 09/04/2018. Mickel Baas confirmed that patient will received a 3 month supply of medication and that it should arrive at providers office in the next 7-10 business days.  Successful call to Cathy Gonzalez, HIPAA identifiers verified. Informed Cathy Gonzalez that she has been approved and that medication will be delivered to Dr. Perley Jain office in the next 7-10 business days.  Will follow up with patient to confirm medication has been received in 10-14 business days.  Maud Deed [place], [place] Management [phone]

## 2018-07-09 ENCOUNTER — Ambulatory Visit: Payer: Self-pay | Admitting: Pharmacist

## 2018-07-21 DIAGNOSIS — E118 Type 2 diabetes mellitus with unspecified complications: Secondary | ICD-10-CM | POA: Diagnosis not present

## 2018-07-21 DIAGNOSIS — E785 Hyperlipidemia, unspecified: Secondary | ICD-10-CM | POA: Diagnosis not present

## 2018-07-21 DIAGNOSIS — I10 Essential (primary) hypertension: Secondary | ICD-10-CM | POA: Diagnosis not present

## 2018-07-21 DIAGNOSIS — E6609 Other obesity due to excess calories: Secondary | ICD-10-CM | POA: Diagnosis not present

## 2018-07-28 ENCOUNTER — Ambulatory Visit: Payer: PPO | Admitting: Podiatry

## 2018-07-30 ENCOUNTER — Other Ambulatory Visit: Payer: Self-pay | Admitting: Pharmacist

## 2018-07-30 NOTE — Patient Outreach (Signed)
[place] [facility]) Care Management [place]  07/30/2018  Cathy Gonzalez 09/18/1946 [id number]  Incoming call received from [name] regarding [place] manager for [employer] for 2020.  I advised patient to call [employer] concierge services or the member phone number listed on the back of her insurance card.  Patient voiced understanding.  She reports that her PAP insulin is ready to be picked up at her provider office.  She has an appointment in early November and will pick it up during appointment.  She states she has enough insulin to last until her appointment.  She denies any other medication related questions at this time.   [facility] pharmacy case is being closed due to the following reasons:  Goals have been met. Patient has been provided [facility] CM contact information if assistance needed in the future.    Thank you for allowing [facility] pharmacy to be involved in this patient's care.    Ralene Bathe, PharmD, [place] [phone]

## 2018-08-09 DIAGNOSIS — Z6841 Body Mass Index (BMI) 40.0 and over, adult: Secondary | ICD-10-CM | POA: Diagnosis not present

## 2018-08-09 DIAGNOSIS — I10 Essential (primary) hypertension: Secondary | ICD-10-CM | POA: Diagnosis not present

## 2018-08-09 DIAGNOSIS — E118 Type 2 diabetes mellitus with unspecified complications: Secondary | ICD-10-CM | POA: Diagnosis not present

## 2018-08-11 ENCOUNTER — Other Ambulatory Visit: Payer: Self-pay | Admitting: *Deleted

## 2018-08-11 NOTE — Patient Outreach (Signed)
[place] [facility]) Care Management  08/11/2018  AAIRA OESTREICHER December 22, 1945 [id number]   Routine telephone outreach to Mrs. Peart as part of the [place] Management health coach program to assist her with chronic disease issues with a focus on Type II DM diabetes self management. Left message on her home number requesting return call in order to complete follow up assessment. Await return call from Mrs Haser.  Barrington Ellison RN,CCM,CDE [place] Management Coordinator Office Phone [phone] Office Fax [phone]

## 2018-08-20 ENCOUNTER — Other Ambulatory Visit: Payer: Self-pay | Admitting: *Deleted

## 2018-08-20 NOTE — Patient Outreach (Addendum)
[facility]) Care Management  Cathy Gonzalez  08/20/2018   Cathy Gonzalez [birth year] [id number]  Subjective: Routine Health Coach telephonic follow up.  Cathy Gonzalez states she is doing well, has lost a total of 30 lbs in last 6 months by eating lots of vegetables, no fried foods or concentrated sweets and drinking only water.  She says her peripheral neuropathy is much improved. She reports a fasting blood sugar of 129 this morning. She says she plans to start exercising through the [place] [facility] in [facility].  She has questions about food discounts through her [employer] PPO plan.  She states her goal is to get her A1C to 6.0% in 2020 and hopefully decrease the amount of diabetes medications she requires for blood sugar control.  Objective: not applicable  Encounter Medications:  Outpatient Encounter Medications as of 08/20/2018  Medication Sig Note  . amLODipine (NORVASC) 10 MG tablet Take 10 mg by mouth daily.   [name] aspirin EC 81 MG tablet Take 1 tablet (81 mg total) by mouth daily. 08/20/2018: Takes as needed, does not take daily as she says it irritates her stomach  . chlorthalidone (HYGROTON) 25 MG tablet Take 25 mg by mouth daily.    . clotrimazole-betamethasone (LOTRISONE) cream APPLY LIBERALLY TO AFFECTED AREA TWICE DAILY 08/20/2018: Uses as needed for rash and itching  . colchicine 0.6 MG tablet Take 0.6 mg by mouth as needed (gout).    [name] ezetimibe (ZETIA) 10 MG tablet    . FIASP FLEXTOUCH 100 UNIT/ML SOPN Inject 4-15 Units into the skin 3 (three) times daily with meals.  08/20/2018: Takes only if her blood sugar goes above 150  . furosemide (LASIX) 40 MG tablet 40 mg daily.    . insulin degludec (TRESIBA) 100 UNIT/ML SOPN FlexTouch Pen Inject 22 Units into the skin daily at 10 pm.  03/22/2018: 10 units at 5:00 pm   . levETIRAcetam (KEPPRA) 500 MG tablet TAKE 3 TABLETS BY MOUTH TWICE DAILY   . metFORMIN (GLUCOPHAGE) 500 MG tablet  Take 500 mg by mouth at bedtime.    . montelukast (SINGULAIR) 10 MG tablet Take 10 mg by mouth at bedtime.   . ONE TOUCH ULTRA TEST test strip    . Potassium Chloride ER 20 MEQ TBCR Take 1 tablet by mouth 2 (two) times daily.   . pregabalin (LYRICA) 300 MG capsule Take 1 capsule (300 mg total) by mouth 2 (two) times daily.   . sucralfate (CARAFATE) 1 G tablet Take 1 g by mouth 4 (four) times daily as needed.    . [DISCONTINUED] KLOR-CON M20 20 MEQ tablet 20 mEq daily.    . naproxen sodium (ALEVE) 220 MG tablet Take 220 mg by mouth 2 (two) times daily. 03/22/2018: As needed  . NONFORMULARY OR COMPOUNDED ITEM [facility]  Peripheral Neuropathy Cream- Bupivacaine 1%, Doxepin 3%, Gabapentin 6%, Pentoxifylline 3%, Topiramate 1% Apply 1-2 grams to affected area 3-4 times daily Qty. 120 gm 3 refills   . ONETOUCH DELICA LANCETS 58K MISC    . telmisartan (MICARDIS) 80 MG tablet     No facility-administered encounter medications on file as of 08/20/2018.     Functional Status:  In your present state of health, do you have any difficulty performing the following activities: 03/22/2018 02/23/2018  Hearing? N N  Vision? N N  Difficulty concentrating or making decisions? N N  Walking or climbing stairs? N N  Dressing or bathing? N N  Doing errands, shopping? N N  Preparing Food and eating ? N N  Using the Toilet? N N  In the past six months, have you accidently leaked urine? N N  Do you have problems with loss of bowel control? N N  Managing your Medications? N N  Managing your Finances? N N  Housekeeping or managing your Housekeeping? N N  Some recent data might be hidden    Fall/Depression Screening: Fall Risk  02/23/2018 10/20/2016 04/03/2016  Falls in the past year? No No No   PHQ 2/9 Scores 02/23/2018 10/20/2016 02/01/2016 01/30/2016 11/07/2015 10/11/2015 10/11/2015  PHQ - 2 Score 1 0 0 0 0 0 0    Assessment: Significant improvement in Type II diabetes self management as evidenced by  improved Hgb A1C and improved self monitored CBGS.  Eating protein at every meal. Patient has questions about insurance plan benefits related to grocery discounts.    THN CM Care Plan Problem One     Most Recent Value  Care Plan Problem One  Type II DM - significant improvement Hgb  A1C now 7.9%, previously 11.0%  Role Documenting the Problem One  Care Management [place] for Problem One  Active  THN Long Term Goal  In the next 90 days , patient will demonstrate ongoing improved diabetes self management skills as evidenced by improved A1C to <6.5 and no weight gain or ongoing weight loss  THN Long Term Goal Start Date  08/20/18  Interventions for Problem One Long Term Goal reviewed DM self management skills and strategies to improve her blood sugar control to meet her personal goal of A1C <6.0% without risking hypoglycemia, reviewed self monitored CBG readings and reviewed targets for pre and post meal,  discussed the benefits of exercise on blood sugar control and encouraged Cathy Gonzalez to begin Silver Sneakers  exercise plan in 2020, positive reinforcement provided to Cathy Gonzalez for excellent blood sugar control and significant weight loss due to CHO controlled meal plan,  secure e-mail sent to [place] requesting that they contact [name] regarding health plan's benefits related to food discount, reviewed next provider appointment date and time and ensured she has transportation to appointment      Sent secure e-mail to [employer] [facility] asking her to outreach to patient to answer her questions about health plan food discount cards. RNCM will continue with telephonic health coaching for chronic disease states at least quarterly.  Barrington Ellison RN,CCM,CDE [place] Management Coordinator Office Phone [phone] Office Fax [phone]

## 2018-08-25 ENCOUNTER — Ambulatory Visit: Payer: PPO | Admitting: Podiatry

## 2018-09-06 ENCOUNTER — Other Ambulatory Visit: Payer: Self-pay | Admitting: Pharmacy Technician

## 2018-09-06 NOTE — Patient Outreach (Signed)
[facility]) Care Management  09/06/2018  Cathy Gonzalez 10/27/1945 [id number]   Unsuccessful call in reference to reapplying for 2020 patient assistance, HIPAA compliant voicemail left.  [name] [place] Certified Pharmacy Technician [place] Management Direct [phone]

## 2018-09-13 ENCOUNTER — Other Ambulatory Visit: Payer: Self-pay | Admitting: Gastroenterology

## 2018-09-13 DIAGNOSIS — E119 Type 2 diabetes mellitus without complications: Secondary | ICD-10-CM | POA: Diagnosis not present

## 2018-09-13 DIAGNOSIS — K766 Portal hypertension: Secondary | ICD-10-CM | POA: Diagnosis not present

## 2018-09-13 DIAGNOSIS — K219 Gastro-esophageal reflux disease without esophagitis: Secondary | ICD-10-CM | POA: Diagnosis not present

## 2018-09-21 ENCOUNTER — Other Ambulatory Visit: Payer: Self-pay | Admitting: *Deleted

## 2018-09-21 NOTE — Patient Outreach (Signed)
[facility]) Care Management  09/21/2018  AUBRI GATHRIGHT Apr 12, 1946 [id number]   Mrs. Leitzel receives chronic disease management thorough the Pension scheme manager program.  Left message on her home number requesting return call so she can be advised of new health coach assignment since this RNCM is changing job responsiblitites. Await return call.  Barrington Ellison RN,CCM,CDE [place] Management Coordinator Office Phone [phone] Office Fax [phone]

## 2018-09-22 ENCOUNTER — Ambulatory Visit
Admission: RE | Admit: 2018-09-22 | Discharge: 2018-09-22 | Disposition: A | Discharge status: Home / Self Care | Payer: PPO | Source: Ambulatory Visit | Attending: Gastroenterology | Admitting: Gastroenterology

## 2018-09-22 DIAGNOSIS — K7689 Other specified diseases of liver: Secondary | ICD-10-CM | POA: Diagnosis not present

## 2018-09-27 ENCOUNTER — Other Ambulatory Visit: Payer: Self-pay

## 2018-09-27 ENCOUNTER — Encounter (HOSPITAL_COMMUNITY): Payer: Self-pay | Admitting: *Deleted

## 2018-10-04 ENCOUNTER — Ambulatory Visit: Payer: PPO | Admitting: Adult Health

## 2018-10-04 ENCOUNTER — Encounter: Payer: Self-pay | Admitting: Adult Health

## 2018-10-05 ENCOUNTER — Other Ambulatory Visit: Payer: Self-pay | Admitting: *Deleted

## 2018-10-05 DIAGNOSIS — Z794 Long term (current) use of insulin: Secondary | ICD-10-CM

## 2018-10-05 DIAGNOSIS — Z634 Disappearance and death of family member: Secondary | ICD-10-CM | POA: Diagnosis not present

## 2018-10-05 DIAGNOSIS — E1140 Type 2 diabetes mellitus with diabetic neuropathy, unspecified: Secondary | ICD-10-CM

## 2018-10-05 NOTE — Patient Outreach (Signed)
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 Advised her the new RN Health coach will reach out to her after the first of the year.  Barrington Ellison RN,CCM,CDE [place] Management Coordinator Office Phone [phone] Office Fax [phone]

## 2018-10-07 NOTE — Anesthesia Preprocedure Evaluation (Addendum)
Anesthesia Evaluation  Patient identified by MRN, date of birth, ID band Patient awake    Reviewed: Allergy & Precautions, NPO status , Patient's Chart, lab work & pertinent test results  History of Anesthesia Complications Negative for: history of anesthetic complications  Airway Mallampati: II  TM Distance: >3 FB Neck ROM: Full    Dental no notable dental hx. (+) Dental Advisory Given   Pulmonary sleep apnea ,    Pulmonary exam normal        Cardiovascular hypertension, Pt. on medications Normal cardiovascular exam     Neuro/Psych Seizures -, Well Controlled,  negative psych ROS   GI/Hepatic (+) Cirrhosis       , Hepatitis -, C  Endo/Other  diabetes, Type 2Morbid obesity  Renal/GU      Musculoskeletal   Abdominal   Peds  Hematology   Anesthesia Other Findings   Reproductive/Obstetrics                           Anesthesia Physical  Anesthesia Plan  ASA: III  Anesthesia Plan: MAC   Post-op Pain Management:    Induction:   PONV Risk Score and Plan: Ondansetron and Propofol infusion  Airway Management Planned: Simple Face Mask  Additional Equipment:   Intra-op Plan:   Post-operative Plan:   Informed Consent: I have reviewed the patients History and Physical, chart, labs and discussed the procedure including the risks, benefits and alternatives for the proposed anesthesia with the patient or authorized representative who has indicated his/her understanding and acceptance.   Dental advisory given  Plan Discussed with: CRNA and Anesthesiologist  Anesthesia Plan Comments:        Anesthesia Quick Evaluation

## 2018-10-08 ENCOUNTER — Ambulatory Visit (HOSPITAL_COMMUNITY)
Admission: RE | Admit: 2018-10-08 | Discharge: 2018-10-08 | Disposition: A | Discharge status: Home / Self Care | Payer: PPO | Source: Ambulatory Visit | Attending: Gastroenterology | Admitting: Gastroenterology

## 2018-10-08 ENCOUNTER — Other Ambulatory Visit: Payer: Self-pay

## 2018-10-08 ENCOUNTER — Ambulatory Visit (HOSPITAL_COMMUNITY): Payer: PPO | Admitting: Anesthesiology

## 2018-10-08 ENCOUNTER — Encounter (HOSPITAL_COMMUNITY)
Admission: RE | Disposition: A | Discharge status: Home / Self Care | Payer: Self-pay | Source: Ambulatory Visit | Attending: Gastroenterology

## 2018-10-08 ENCOUNTER — Encounter (HOSPITAL_COMMUNITY): Payer: Self-pay | Admitting: *Deleted

## 2018-10-08 DIAGNOSIS — I10 Essential (primary) hypertension: Secondary | ICD-10-CM | POA: Insufficient documentation

## 2018-10-08 DIAGNOSIS — E1140 Type 2 diabetes mellitus with diabetic neuropathy, unspecified: Secondary | ICD-10-CM | POA: Insufficient documentation

## 2018-10-08 DIAGNOSIS — K3189 Other diseases of stomach and duodenum: Secondary | ICD-10-CM | POA: Insufficient documentation

## 2018-10-08 DIAGNOSIS — K766 Portal hypertension: Secondary | ICD-10-CM | POA: Diagnosis not present

## 2018-10-08 DIAGNOSIS — Z794 Long term (current) use of insulin: Secondary | ICD-10-CM | POA: Insufficient documentation

## 2018-10-08 DIAGNOSIS — M1990 Unspecified osteoarthritis, unspecified site: Secondary | ICD-10-CM | POA: Diagnosis not present

## 2018-10-08 DIAGNOSIS — Z79899 Other long term (current) drug therapy: Secondary | ICD-10-CM | POA: Diagnosis not present

## 2018-10-08 DIAGNOSIS — G4733 Obstructive sleep apnea (adult) (pediatric): Secondary | ICD-10-CM | POA: Insufficient documentation

## 2018-10-08 DIAGNOSIS — Z6841 Body Mass Index (BMI) 40.0 and over, adult: Secondary | ICD-10-CM | POA: Insufficient documentation

## 2018-10-08 DIAGNOSIS — R1310 Dysphagia, unspecified: Secondary | ICD-10-CM | POA: Diagnosis not present

## 2018-10-08 DIAGNOSIS — K7460 Unspecified cirrhosis of liver: Secondary | ICD-10-CM | POA: Insufficient documentation

## 2018-10-08 DIAGNOSIS — B1920 Unspecified viral hepatitis C without hepatic coma: Secondary | ICD-10-CM | POA: Diagnosis not present

## 2018-10-08 DIAGNOSIS — E119 Type 2 diabetes mellitus without complications: Secondary | ICD-10-CM | POA: Diagnosis not present

## 2018-10-08 HISTORY — DX: Pain in unspecified knee: M25.569

## 2018-10-08 HISTORY — PX: ESOPHAGOGASTRODUODENOSCOPY (EGD) WITH PROPOFOL: SHX5813

## 2018-10-08 LAB — GLUCOSE, CAPILLARY: Glucose-Capillary: 90 mg/dL (ref 70–99)

## 2018-10-08 SURGERY — ESOPHAGOGASTRODUODENOSCOPY (EGD) WITH PROPOFOL
Anesthesia: Monitor Anesthesia Care

## 2018-10-08 MED ORDER — PROPOFOL 500 MG/50ML IV EMUL
INTRAVENOUS | Status: DC | PRN
  Administered 2018-10-08: 30 mg via INTRAVENOUS

## 2018-10-08 MED ORDER — PROPOFOL 10 MG/ML IV BOLUS
INTRAVENOUS | Status: AC
  Filled 2018-10-08: qty 40

## 2018-10-08 MED ORDER — PROPOFOL 500 MG/50ML IV EMUL
INTRAVENOUS | Status: DC | PRN
  Administered 2018-10-08: 150 ug/kg/min via INTRAVENOUS

## 2018-10-08 MED ORDER — LACTATED RINGERS IV SOLN
INTRAVENOUS | Status: DC
  Administered 2018-10-08: 1000 mL via INTRAVENOUS

## 2018-10-08 MED ORDER — SODIUM CHLORIDE 0.9 % IV SOLN: INTRAVENOUS | Status: DC

## 2018-10-08 SURGICAL SUPPLY — 15 items

## 2018-10-08 NOTE — Discharge Instructions (Signed)
Upper Endoscopy, Adult, Care After °This sheet gives you information about how to care for yourself after your procedure. Your health care provider may also give you more specific instructions. If you have problems or questions, contact your health care provider. °What can I expect after the procedure? °After the procedure, it is common to have: °· A sore throat. °· Mild stomach pain or discomfort. °· Bloating. °· Nausea. °Follow these instructions at home: ° °· Follow instructions from your health care provider about what to eat or drink after your procedure. °· Return to your normal activities as told by your health care provider. Ask your health care provider what activities are safe for you. °· Take over-the-counter and prescription medicines only as told by your health care provider. °· Do not drive for 24 hours if you were given a sedative during your procedure. °· Keep all follow-up visits as told by your health care provider. This is important. °Contact a health care provider if you have: °· A sore throat that lasts longer than one day. °· Trouble swallowing. °Get help right away if: °· You vomit blood or your vomit looks like coffee grounds. °· You have: °? A fever. °? Bloody, black, or tarry stools. °? A severe sore throat or you cannot swallow. °? Difficulty breathing. °? Severe pain in your chest or abdomen. °Summary °· After the procedure, it is common to have a sore throat, mild stomach discomfort, bloating, and nausea. °· Do not drive for 24 hours if you were given a sedative during the procedure. °· Follow instructions from your health care provider about what to eat or drink after your procedure. °· Return to your normal activities as told by your health care provider. °This information is not intended to replace advice given to you by your health care provider. Make sure you discuss any questions you have with your health care provider. °Document Released: 03/23/2012 Document Revised: 02/22/2018  Document Reviewed: 02/22/2018 °Elsevier Interactive Patient Education © 2019 Elsevier Inc. ° °

## 2018-10-08 NOTE — H&P (Signed)
  Cathy Gonzalez HPI: Her EGD on 08/21/2015 was positive for portal HTN gastropathy without evidence of esophageal varices. She has a history of HCV and her elastography was reported as an F3/4.  Past Medical History:  Diagnosis Date  . Arthritis   . Cataract    left cataract removed in 2017  . Diabetes mellitus   . Diabetic neuropathy ([place])   . Diabetic neuropathy, type II diabetes mellitus ([place])   . Hepatitis C    took tx for   . Hypertension   . Hypokalemia   . Knee pain    right   . Morbidly obese ([place])   . Nocturnal seizures ([place]) 05/14/2015   no bad seizure since 1980's  . OSA on CPAP    uses cpap 2-3 week  . Plantar fasciitis    both feet    Past Surgical History:  Procedure Laterality Date  . ABDOMINAL HYSTERECTOMY     partial  . CATARACT EXTRACTION Left   . COLONOSCOPY WITH PROPOFOL N/A 08/21/2015   Procedure: COLONOSCOPY WITH PROPOFOL;  Surgeon: Carol Ada, MD;  Location: [facility] ENDOSCOPY;  Service: Endoscopy;  Laterality: N/A;  . ESOPHAGOGASTRODUODENOSCOPY (EGD) WITH PROPOFOL N/A 08/21/2015   Procedure: ESOPHAGOGASTRODUODENOSCOPY (EGD) WITH PROPOFOL;  Surgeon: Carol Ada, MD;  Location: [facility] ENDOSCOPY;  Service: Endoscopy;  Laterality: N/A;  . EYE SURGERY Bilateral    cataracts    Family History  Problem Relation Age of Onset  . Diabetes Mother   . Heart attack Mother   . Diabetes Sister   . Diabetes Brother   . Cancer Sister        breast  . Breast cancer Sister   . Seizures Neg Hx     Social History:  reports that she has never smoked. She has never used smokeless tobacco. She reports that she does not drink alcohol or use drugs.  Allergies: No Known Allergies  Medications:  Scheduled:  Continuous: . sodium chloride    . lactated ringers 1,000 mL (10/08/18 0659)    Results for orders placed or performed during the hospital encounter of 10/08/18 (from the past 24 hour(s))  Glucose, capillary     Status: None   Collection Time: 10/08/18  6:54  AM  Result Value Ref Range   Glucose-Capillary 90 70 - 99 mg/dL     No results found.  ROS:  As stated above in the HPI otherwise negative.  Blood pressure 128/78, pulse 72, temperature 97.7 F (36.5 C), temperature source Oral, resp. rate 17, height 5\' 4"  (1.626 m), weight 106.1 kg, SpO2 94 %.    PE: Gen: NAD, Alert and Oriented HEENT:  [place]/AT, EOMI Neck: Supple, no LAD Lungs: CTA Bilaterally CV: RRR without M/G/R ABM: Soft, NTND, +BS Ext: No C/C/E  Assessment/Plan: 1) Cirrhosis. 2) Portal HTN.  Plan: 1) EGD to survey for varices.  Prakash Kimberling D 10/08/2018, 7:26 AM

## 2018-10-08 NOTE — Transfer of Care (Signed)
Immediate Anesthesia Transfer of Care Note  Patient: Cathy Gonzalez  Procedure(s) Performed: ESOPHAGOGASTRODUODENOSCOPY (EGD) WITH PROPOFOL (N/A )  Patient Location: PACU  Anesthesia Type:MAC  Level of Consciousness: sedated, patient cooperative and responds to stimulation  Airway & Oxygen Therapy: Patient Spontanous Breathing and Patient connected to nasal cannula oxygen  Post-op Assessment: Report given to RN and Post -op Vital signs reviewed and stable  Post vital signs: Reviewed and stable  Last Vitals:  Vitals Value Taken Time  BP    Temp    Pulse    Resp    SpO2      Last Pain:  Vitals:   10/08/18 0635  TempSrc: Oral  PainSc: 0-No pain         Complications: No apparent anesthesia complications

## 2018-10-08 NOTE — Op Note (Signed)
[facility] Patient Name: Cathy Gonzalez Procedure Date: 10/08/2018 MRN: [id number] Attending MD: Carol Ada , MD Date of Birth: [birth year] CSN: [id number] Age: 73 Admit Type: Outpatient Procedure:                Upper GI endoscopy Indications:              Surveillance procedure Providers:                Carol Ada, MD, Cleda Daub, RN, William Dalton, Technician Referring MD:              Medicines:                Propofol per Anesthesia Complications:            No immediate complications. Estimated Blood Loss:     Estimated blood loss: none. Procedure:                Pre-Anesthesia Assessment:                           - Prior to the procedure, a History and Physical                            was performed, and patient medications and                            allergies were reviewed. The patient's tolerance of                            previous anesthesia was also reviewed. The risks                            and benefits of the procedure and the sedation                            options and risks were discussed with the patient.                            All questions were answered, and informed consent                            was obtained. Prior Anticoagulants: The patient has                            taken no previous anticoagulant or antiplatelet                            agents. ASA Grade Assessment: III - A patient with                            severe systemic disease. After reviewing the risks                            and  benefits, the patient was deemed in                            satisfactory condition to undergo the procedure.                           - Sedation was administered by an anesthesia                            professional. Deep sedation was attained.                           After obtaining informed consent, the endoscope was                            passed under direct vision. Throughout  the                            procedure, the patient's blood pressure, pulse, and                            oxygen saturations were monitored continuously. The                            GIF-H190 ([id number]) Olympus Adult Endoscope was                            introduced through the mouth, and advanced to the                            second part of duodenum. The upper GI endoscopy was                            accomplished without difficulty. The patient                            tolerated the procedure well. Scope In: Scope Out: Findings:      The esophagus was normal. A submucosal bleb was found in the lower third       of the esophagus. This was isolated and not consistent with esophageal       varices.      Mild portal hypertensive gastropathy was found in the gastric fundus and       in the gastric body.      The examined duodenum was normal. Impression:               - Normal esophagus.                           - Portal hypertensive gastropathy.                           - Normal examined duodenum.                           - No specimens collected. Moderate Sedation:  Not Applicable - Patient had care per Anesthesia. Recommendation:           - Patient has a contact number available for                            emergencies. The signs and symptoms of potential                            delayed complications were discussed with the                            patient. Return to normal activities tomorrow.                            Written discharge instructions were provided to the                            patient.                           - Resume previous diet.                           - Continue present medications.                           - Repeat upper endoscopy in 3 years for                            surveillance. Procedure Code(s):        --- Professional ---                           [phone], Esophagogastroduodenoscopy, flexible,                             transoral; diagnostic, including collection of                            specimen(s) by brushing or washing, when performed                            (separate procedure) Diagnosis Code(s):        --- Professional ---                           K76.6, Portal hypertension                           K31.89, Other diseases of stomach and duodenum CPT copyright 2018 American Medical Association. All rights reserved. The codes documented in this report are preliminary and upon coder review may  be revised to meet current compliance requirements. Carol Ada, MD Carol Ada, MD 10/08/2018 7:49:02 AM This report has been signed electronically. Number of Addenda: 0

## 2018-10-08 NOTE — Anesthesia Postprocedure Evaluation (Signed)
Anesthesia Post Note  Patient: Christene Lye D[place]fer  Procedure(s) Performed: ESOPHAGOGASTRODUODENOSCOPY (EGD) WITH PROPOFOL (N/A )     Patient location during evaluation: Endoscopy Anesthesia Type: MAC Level of consciousness: awake and alert Pain management: pain level controlled Vital Signs Assessment: post-procedure vital signs reviewed and stable Respiratory status: spontaneous breathing, nonlabored ventilation, respiratory function stable and patient connected to nasal cannula oxygen Cardiovascular status: blood pressure returned to baseline and stable Postop Assessment: no apparent nausea or vomiting Anesthetic complications: no    Last Vitals:  Vitals:   10/08/18 0750 10/08/18 0800  BP: 111/68 122/79  Pulse: 66 65  Resp: 15 18  Temp:    SpO2: 99% 96%    Last Pain:  Vitals:   10/08/18 0800  TempSrc:   PainSc: 0-No pain                 Andee Chivers DANIEL

## 2018-10-18 DIAGNOSIS — Z634 Disappearance and death of family member: Secondary | ICD-10-CM | POA: Diagnosis not present

## 2018-10-19 ENCOUNTER — Other Ambulatory Visit: Payer: Self-pay | Admitting: Pharmacy Technician

## 2018-10-19 NOTE — Patient Outreach (Signed)
[facility]) Care Management  10/19/2018  Cathy Gonzalez [birth year] [id number]   Incoming call from patient stating she was having issues getting in contact with [employer] to find out what her co-pay would be for Podiatrist appointment.  Contacted [employer] source who provided me with phone number for patient to call as well as patients assigned concierge person. Provided info to patient.  Patient also states that Dr. Criss Rosales confirmed that they would submit her application for [employer] to [place], informed patient I would follow up with company in 3-5 business days to check status of application.  [name] [place] Certified Pharmacy Technician [place] Management Direct [phone]

## 2018-10-21 ENCOUNTER — Encounter: Payer: Self-pay | Admitting: Podiatry

## 2018-10-21 ENCOUNTER — Ambulatory Visit: Payer: PPO | Admitting: Podiatry

## 2018-10-21 DIAGNOSIS — M79675 Pain in left toe(s): Secondary | ICD-10-CM | POA: Diagnosis not present

## 2018-10-21 DIAGNOSIS — E1142 Type 2 diabetes mellitus with diabetic polyneuropathy: Secondary | ICD-10-CM

## 2018-10-21 DIAGNOSIS — B351 Tinea unguium: Secondary | ICD-10-CM | POA: Diagnosis not present

## 2018-10-21 DIAGNOSIS — L84 Corns and callosities: Secondary | ICD-10-CM

## 2018-10-21 DIAGNOSIS — M79674 Pain in right toe(s): Secondary | ICD-10-CM

## 2018-10-21 NOTE — Patient Instructions (Signed)
Diabetes Mellitus and Foot Care  Foot care is an important part of your health, especially when you have diabetes. Diabetes may cause you to have problems because of poor blood flow (circulation) to your feet and legs, which can cause your skin to:   Become thinner and drier.   Break more easily.   Heal more slowly.   Peel and crack.  You may also have nerve damage (neuropathy) in your legs and feet, causing decreased feeling in them. This means that you may not notice minor injuries to your feet that could lead to more serious problems. Noticing and addressing any potential problems early is the best way to prevent future foot problems.  How to care for your feet  Foot hygiene   Wash your feet daily with warm water and mild soap. Do not use hot water. Then, pat your feet and the areas between your toes until they are completely dry. Do not soak your feet as this can dry your skin.   Trim your toenails straight across. Do not dig under them or around the cuticle. File the edges of your nails with an emery board or nail file.   Apply a moisturizing lotion or petroleum jelly to the skin on your feet and to dry, brittle toenails. Use lotion that does not contain alcohol and is unscented. Do not apply lotion between your toes.  Shoes and socks   Wear clean socks or stockings every day. Make sure they are not too tight. Do not wear knee-high stockings since they may decrease blood flow to your legs.   Wear shoes that fit properly and have enough cushioning. Always look in your shoes before you put them on to be sure there are no objects inside.   To break in new shoes, wear them for just a few hours a day. This prevents injuries on your feet.  Wounds, scrapes, corns, and calluses   Check your feet daily for blisters, cuts, bruises, sores, and redness. If you cannot see the bottom of your feet, use a mirror or ask someone for help.   Do not cut corns or calluses or try to remove them with medicine.   If you  find a minor scrape, cut, or break in the skin on your feet, keep it and the skin around it clean and dry. You may clean these areas with mild soap and water. Do not clean the area with peroxide, alcohol, or iodine.   If you have a wound, scrape, corn, or callus on your foot, look at it several times a day to make sure it is healing and not infected. Check for:  ? Redness, swelling, or pain.  ? Fluid or blood.  ? Warmth.  ? Pus or a bad smell.  General instructions   Do not cross your legs. This may decrease blood flow to your feet.   Do not use heating pads or hot water bottles on your feet. They may burn your skin. If you have lost feeling in your feet or legs, you may not know this is happening until it is too late.   Protect your feet from hot and cold by wearing shoes, such as at the beach or on hot pavement.   Schedule a complete foot exam at least once a year (annually) or more often if you have foot problems. If you have foot problems, report any cuts, sores, or bruises to your health care provider immediately.  Contact a health care provider if:     You have a medical condition that increases your risk of infection and you have any cuts, sores, or bruises on your feet.   You have an injury that is not healing.   You have redness on your legs or feet.   You feel burning or tingling in your legs or feet.   You have pain or cramps in your legs and feet.   Your legs or feet are numb.   Your feet always feel cold.   You have pain around a toenail.  Get help right away if:   You have a wound, scrape, corn, or callus on your foot and:  ? You have pain, swelling, or redness that gets worse.  ? You have fluid or blood coming from the wound, scrape, corn, or callus.  ? Your wound, scrape, corn, or callus feels warm to the touch.  ? You have pus or a bad smell coming from the wound, scrape, corn, or callus.  ? You have a fever.  ? You have a red line going up your leg.  Summary   Check your feet every day  for cuts, sores, red spots, swelling, and blisters.   Moisturize feet and legs daily.   Wear shoes that fit properly and have enough cushioning.   If you have foot problems, report any cuts, sores, or bruises to your health care provider immediately.   Schedule a complete foot exam at least once a year (annually) or more often if you have foot problems.  This information is not intended to replace advice given to you by your health care provider. Make sure you discuss any questions you have with your health care provider.  Document Released: 09/19/2000 Document Revised: 11/04/2017 Document Reviewed: 10/24/2016  Elsevier Interactive Patient Education  2019 Elsevier Inc.

## 2018-10-26 ENCOUNTER — Ambulatory Visit (INDEPENDENT_AMBULATORY_CARE_PROVIDER_SITE_OTHER): Payer: PPO | Admitting: Orthopaedic Surgery

## 2018-10-29 ENCOUNTER — Other Ambulatory Visit: Payer: Self-pay | Admitting: Pharmacy Technician

## 2018-10-29 NOTE — Patient Outreach (Signed)
[facility]) Care Management  10/29/2018  Cathy Gonzalez 1946-05-08 [id number]   Follow up call to Dr. Perley Jain office in regards to patient stating she was told at her last appointment that they would submit patient assistance application to [employer] for her Lyrica. Spoke to [place] who reviewed patients chart and only saw patient assistance being done for patient in September 2019. Informed [name] that patient has to re-apply for program for 2020.   Faxed provider portion of application to Dr. Perley Jain office for completion.  Will submit completed application once document has been received.  [name] [name] Certified Pharmacy Technician [name] Management Direct Dial:[phone]

## 2018-11-02 ENCOUNTER — Other Ambulatory Visit: Payer: Self-pay | Admitting: Pharmacy Technician

## 2018-11-02 NOTE — Patient Outreach (Signed)
[facility]) Care Management  11/02/2018  Cathy Gonzalez 01-14-1946 [id number]   Received provider portion(s) of patient assistance application for Lyrica. Faxed completed application and required documents into [employer].  Will follow up with company in 3-5 business days to check status of application.  [name] [place] Certified Pharmacy Technician [place] Management Direct Dial:[phone]

## 2018-11-07 NOTE — Progress Notes (Signed)
Subjective: Cathy Gonzalez presents today with history of neuropathy with cc of painful, mycotic toenails.  Pain is aggravated when wearing enclosed shoe gear and relieved with periodic professional debridement.  Patient has peripheral neuropathy managed with Lyrica. She now feels it is working well.  [name], MD is her PCP.   Current Outpatient Medications:  .  acetaminophen (TYLENOL) 500 MG tablet, Take 500 mg by mouth every 6 (six) hours as needed for moderate pain., Disp: , Rfl:  .  amLODipine (NORVASC) 10 MG tablet, Take 10 mg by mouth daily., Disp: , Rfl:  .  aspirin EC 81 MG tablet, Take 1 tablet (81 mg total) by mouth daily. (Patient taking differently: Take 81 mg by mouth daily as needed (pt preferred). ), Disp: 30 tablet, Rfl: 3 .  chlorthalidone (HYGROTON) 25 MG tablet, Take 25 mg by mouth daily. , Disp: , Rfl:  .  colchicine 0.6 MG tablet, Take 0.6 mg by mouth as needed (gout). , Disp: , Rfl:  .  diclofenac sodium (VOLTAREN) 1 % GEL, Apply 1 application topically 3 (three) times daily as needed (feet pain)., Disp: , Rfl:  .  ezetimibe (ZETIA) 10 MG tablet, Take 10 mg by mouth at bedtime. , Disp: , Rfl:  .  FIASP FLEXTOUCH 100 UNIT/ML SOPN, Inject 4-15 Units into the skin 3 (three) times daily with meals. , Disp: , Rfl: 2 .  furosemide (LASIX) 40 MG tablet, Take 40 mg by mouth daily. , Disp: , Rfl:  .  insulin degludec (TRESIBA) 100 UNIT/ML SOPN FlexTouch Pen, Inject 10 Units into the skin every evening. , Disp: , Rfl:  .  levETIRAcetam (KEPPRA) 500 MG tablet, TAKE 3 TABLETS BY MOUTH TWICE DAILY, Disp: 540 tablet, Rfl: 3 .  metFORMIN (GLUCOPHAGE) 500 MG tablet, Take 500 mg by mouth at bedtime. , Disp: , Rfl:  .  montelukast (SINGULAIR) 10 MG tablet, Take 10 mg by mouth at bedtime., Disp: , Rfl:  .  ONE TOUCH ULTRA TEST test strip, , Disp: , Rfl: 5 .  ONETOUCH DELICA LANCETS 76B MISC, , Disp: , Rfl: 3 .  Potassium Chloride ER 20 MEQ TBCR, Take 1 tablet by mouth 2 (two) times  daily., Disp: , Rfl: 6 .  pregabalin (LYRICA) 300 MG capsule, Take 1 capsule (300 mg total) by mouth 2 (two) times daily., Disp: 60 capsule, Rfl: 0 .  sucralfate (CARAFATE) 1 G tablet, Take 1 g by mouth 2 (two) times daily. , Disp: , Rfl:  .  telmisartan (MICARDIS) 80 MG tablet, Take 80 mg by mouth daily. , Disp: , Rfl: 1  No Known Allergies  Objective:  Vascular Examination: Capillary refill time immediate x 10 digits Dorsalis pedis and Posterior tibial pulses Digital hair x 10 digits was absent Skin temperature gradient WNL b/l  Dermatological Examination: Skin with normal turgor, texture and tone b/l  Toenails  2-5 left, 3-5 right discolored, thick, dystrophic with subungual debris and pain with palpation to nailbeds due to thickness of nails.  Hyperkeratotic lesion submetatarsal head 5 b/l, lateral 5th digit b/l. No erythema, no edema, no drainage, no flocculence.  Anonychia noted bl hallux and right 2nd digit with nail beds intact.  Musculoskeletal: Muscle strength 5/5 to all muscle groups b/l  Neurological: Sensation with 10 gram monofilament is intact b/l Vibratory sensation absent b/l  Assessment: 1. Painful onychomycosis toenails  2-5 left, 3-5 right 2. Calluses submetatarsal head 5 b/l 3. Corns lateral 5th digit b/l 4. NIDDM with neuropathy  Plan: 1. Toenails 2-5 left, 3-5 right were debrided in length and girth without iatrogenic bleeding. Calluses pared submetatarsal head 5 b/l; corns pared b/l 5th digits. 2. Patient to continue soft, supportive shoe gear 3. Patient to report any pedal injuries to medical professional  4. Follow up 3 months. Patient/POA to call should there be a concern in the interim.

## 2018-11-08 ENCOUNTER — Other Ambulatory Visit: Payer: Self-pay | Admitting: Pharmacy Technician

## 2018-11-08 NOTE — Patient Outreach (Signed)
[facility]) Care Management  11/08/2018  MADALEN GAVIN [birth year] [id number]   Follow up call placed to [place] regarding patient assistance application(s) for Lyrica , Eduard Clos confirmed that patient has been approved as of 1/29 until 10/06/19. Medication should arrive at patients house on Wednesday 2/5.    Successful call placed to patient regarding patient assistance update for Lyrica, HIPAA identifiers verified. Informed Ms. Humber of the updated information.  Will follow up with patient in 2-3 business days to confirm medication has been received.  [name] [place] Certified Pharmacy Technician [place] Management Direct [phone]

## 2018-11-10 ENCOUNTER — Other Ambulatory Visit: Payer: Self-pay

## 2018-11-10 ENCOUNTER — Ambulatory Visit: Payer: Self-pay

## 2018-11-10 NOTE — Patient Outreach (Signed)
[facility]) Care Management  11/10/2018  Cathy Gonzalez 1946/05/03 [id number]    1st outreach attempt to the patient for assessment. No answer.  HIPAA compliant voicemail left with contact information.  Plan: RN Health Coach will send letter. [place] will make outreach attempt the patient within thirty days.   [name] RN, BSN, [place] Direct Dial:  [phone]  Fax: [phone]

## 2018-11-11 ENCOUNTER — Ambulatory Visit: Payer: PPO | Admitting: Podiatry

## 2018-11-11 ENCOUNTER — Encounter: Payer: Self-pay | Admitting: Podiatry

## 2018-11-11 ENCOUNTER — Other Ambulatory Visit: Payer: Self-pay

## 2018-11-11 DIAGNOSIS — E1142 Type 2 diabetes mellitus with diabetic polyneuropathy: Secondary | ICD-10-CM

## 2018-11-11 NOTE — Patient Outreach (Signed)
[facility]) Care Management  11/11/2018   Cathy Gonzalez [birth year] [id number]  Subjective: Received return call from the patient for assessment.  HIPAA verified.  The patient states that she has an appointment with a podiatrist today.  She has been having some neuropathy in her feet but she states she is not having any pain this morning.  She has not had any falls but states that yesterday she rolled out of the bed and was stuck between the bed and night stand for about 30 minutes but did not injure herself.She states that her a1c was 6.7 and her blood sugar this morning was 154.  She states that she is monitoring her food intake.  Discussed with the patient about portion sizes and healthy choices.  Sent the patient some educational information called all about the carbs for her to review.   Current Medications:  Current Outpatient Medications  Medication Sig Dispense Refill  . acetaminophen (TYLENOL) 500 MG tablet Take 500 mg by mouth every 6 (six) hours as needed for moderate pain.    [name] amLODipine (NORVASC) 10 MG tablet Take 10 mg by mouth daily.    [name] aspirin EC 81 MG tablet Take 1 tablet (81 mg total) by mouth daily. (Patient taking differently: Take 81 mg by mouth daily as needed (pt preferred). ) 30 tablet 3  . chlorthalidone (HYGROTON) 25 MG tablet Take 25 mg by mouth daily.     . colchicine 0.6 MG tablet Take 0.6 mg by mouth as needed (gout).     [name] diclofenac sodium (VOLTAREN) 1 % GEL Apply 1 application topically 3 (three) times daily as needed (feet pain).    [name] ezetimibe (ZETIA) 10 MG tablet Take 10 mg by mouth at bedtime.     [name] FIASP FLEXTOUCH 100 UNIT/ML SOPN Inject 4-15 Units into the skin 3 (three) times daily with meals.   2  . furosemide (LASIX) 40 MG tablet Take 40 mg by mouth daily.     . insulin degludec (TRESIBA) 100 UNIT/ML SOPN FlexTouch Pen Inject 10 Units into the skin every evening.     . levETIRAcetam (KEPPRA) 500 MG tablet TAKE 3 TABLETS BY MOUTH TWICE  DAILY 540 tablet 3  . metFORMIN (GLUCOPHAGE) 500 MG tablet Take 500 mg by mouth at bedtime.     . montelukast (SINGULAIR) 10 MG tablet Take 10 mg by mouth at bedtime.    . ONE TOUCH ULTRA TEST test strip   5  . ONETOUCH DELICA LANCETS 85Y MISC   3  . Potassium Chloride ER 20 MEQ TBCR Take 1 tablet by mouth 2 (two) times daily.  6  . pregabalin (LYRICA) 300 MG capsule Take 1 capsule (300 mg total) by mouth 2 (two) times daily. 60 capsule 0  . sucralfate (CARAFATE) 1 G tablet Take 1 g by mouth 2 (two) times daily.     [name] telmisartan (MICARDIS) 80 MG tablet Take 80 mg by mouth daily.   1   No current facility-administered medications for this visit.     Functional Status:  In your present state of health, do you have any difficulty performing the following activities: 03/22/2018 02/23/2018  Hearing? N N  Vision? N N  Difficulty concentrating or making decisions? N N  Walking or climbing stairs? N N  Dressing or bathing? N N  Doing errands, shopping? N N  Preparing Food and eating ? N N  Using the Toilet? N N  In the past six months, have  you accidently leaked urine? N N  Do you have problems with loss of bowel control? N N  Managing your Medications? N N  Managing your Finances? N N  Housekeeping or managing your Housekeeping? N N  Some recent data might be hidden    Fall/Depression Screening: Fall Risk  11/11/2018 02/23/2018 10/20/2016  Falls in the past year? 0 No No   PHQ 2/9 Scores 02/23/2018 10/20/2016 02/01/2016 01/30/2016 11/07/2015 10/11/2015 10/11/2015  PHQ - 2 Score 1 0 0 0 0 0 0    Assessment: Patient will continue to benefit from health coach outreach for disease management and support.  THN CM Care Plan Problem One     Most Recent Value  Care Plan for Problem One  Active  THN Long Term Goal   In the next 90 days , patient will demonstrate ongoing improved diabetes self management skills as evidenced by improved A1C to <6.5 and no weight gain or ongoing weight loss  THN Long Term  Goal Start Date  11/11/18  Interventions for Problem One Long Term Goal  Discussed CBG readings, food intake andhealthy eating,  and medicationa management.     Plan:  RN Health Coach will contact patient in the month of May and patient agrees to next outreach.   [name] RN, BSN, [place] Direct Dial:  [phone]  Fax: [phone]

## 2018-11-11 NOTE — Progress Notes (Signed)
She presents today for follow-up of diabetic peripheral neuropathy states that the Lyrica and the Voltaren are helping considerably.  She like to have a handicap sticker possible she can hardly walk the distance through the parking lot at work to get into the building.  Objective: Vital signs are stable she is alert and oriented x3.  Pulses are palpable.  Neurologic sensorium is i diminished considerably.  Skin is dry and flaky.  She has no reproducible pain.  Assessment: Diabetic peripheral neuropathy.  Plan: Continue the use of Lyrica Voltaren gel and I also wrote her for 85-month handicap sticker.

## 2018-11-26 DIAGNOSIS — J399 Disease of upper respiratory tract, unspecified: Secondary | ICD-10-CM | POA: Diagnosis not present

## 2018-11-26 DIAGNOSIS — E118 Type 2 diabetes mellitus with unspecified complications: Secondary | ICD-10-CM | POA: Diagnosis not present

## 2018-11-30 ENCOUNTER — Other Ambulatory Visit: Payer: Self-pay | Admitting: Pharmacy Technician

## 2018-11-30 NOTE — Patient Outreach (Addendum)
[facility]) Care Management  11/30/2018  CHENEL WERNLI Nov 29, 1945 [id number]   Unsuccessful call #1 placed to patient regarding patient assistance medication receipt from [place] [place], HIPAA compliant voicemail left.   Will make 2nd outreach call in 2-3 business days to confirm patient received Lyrica from [place] patient assistance, if call has not been returned.  [name] [place] Certified Pharmacy Technician [place] Management Direct Dial:[phone]    ADDENDUM 10:33am  Return call from patient, HIPAA identifiers verified. Ms. Pfluger confirms that she has received Lyrica from [employer] patient assistance. She states that she doesn't have any questions about medication at this time. Requested that she contact me if she does.  Patient assistance thru [place] for Lyrica has been completed, will remove myself from care team.  Maud Deed. Chana Bode [place] Certified Pharmacy Technician [place] Management Direct Dial:[phone]

## 2018-12-09 ENCOUNTER — Ambulatory Visit (INDEPENDENT_AMBULATORY_CARE_PROVIDER_SITE_OTHER): Payer: PPO | Admitting: Orthopaedic Surgery

## 2018-12-09 ENCOUNTER — Ambulatory Visit: Payer: Self-pay

## 2018-12-09 ENCOUNTER — Ambulatory Visit (INDEPENDENT_AMBULATORY_CARE_PROVIDER_SITE_OTHER): Payer: Self-pay

## 2018-12-09 ENCOUNTER — Encounter (INDEPENDENT_AMBULATORY_CARE_PROVIDER_SITE_OTHER): Payer: Self-pay | Admitting: Orthopaedic Surgery

## 2018-12-09 ENCOUNTER — Ambulatory Visit (INDEPENDENT_AMBULATORY_CARE_PROVIDER_SITE_OTHER): Payer: PPO

## 2018-12-09 VITALS — BP 125/71 | HR 84 | Ht 64.0 in | Wt 235.0 lb

## 2018-12-09 DIAGNOSIS — M79645 Pain in left finger(s): Secondary | ICD-10-CM | POA: Diagnosis not present

## 2018-12-09 DIAGNOSIS — G8929 Other chronic pain: Secondary | ICD-10-CM

## 2018-12-09 DIAGNOSIS — M25561 Pain in right knee: Secondary | ICD-10-CM | POA: Diagnosis not present

## 2018-12-09 MED ORDER — LIDOCAINE HCL 1 % IJ SOLN
0.5000 mL | INTRAMUSCULAR | Status: AC | PRN
  Administered 2018-12-09: .5 mL

## 2018-12-09 MED ORDER — LIDOCAINE HCL 1 % IJ SOLN
2.0000 mL | INTRAMUSCULAR | Status: AC | PRN
  Administered 2018-12-09: 2 mL

## 2018-12-09 MED ORDER — METHYLPREDNISOLONE ACETATE 40 MG/ML IJ SUSP
20.0000 mg | INTRAMUSCULAR | Status: AC | PRN
  Administered 2018-12-09: 20 mg via INTRA_ARTICULAR

## 2018-12-09 MED ORDER — METHYLPREDNISOLONE ACETATE 40 MG/ML IJ SUSP
80.0000 mg | INTRAMUSCULAR | Status: AC | PRN
  Administered 2018-12-09: 80 mg via INTRA_ARTICULAR

## 2018-12-09 MED ORDER — BUPIVACAINE HCL 0.5 % IJ SOLN
2.0000 mL | INTRAMUSCULAR | Status: AC | PRN
  Administered 2018-12-09: 2 mL via INTRA_ARTICULAR

## 2018-12-09 NOTE — Progress Notes (Signed)
Office Visit Note   Patient: Cathy Gonzalez           Date of Birth: Jan 28, 1946           MRN: [id number] Visit Date: 12/09/2018              Requested by: [name], [address] [place], [place] [address] PCP: [name], MD   Assessment & Plan: Visit Diagnoses:  1. Chronic pain of right knee   2. Thumb pain, left     Plan: New arthritis base of left thumb and right knee.  Will inject with cortisone.  Long discussion regarding her diagnosis and treatment options also discussed her weight with a BMI of 40 weight would compromise any suggestion of knee replacement  Follow-Up Instructions: Return if symptoms worsen or fail to improve.   Orders:  Orders Placed This Encounter  Procedures  . Large Joint Inj: R knee  . Small Joint Inj: L thumb CMC  . XR Finger Thumb Left   No orders of the defined types were placed in this encounter.     Procedures: Large Joint Inj: R knee on 12/09/2018 3:02 PM Indications: pain and diagnostic evaluation Details: 25 G 1.5 in needle, anteromedial approach  Arthrogram: No  Medications: 2 mL bupivacaine 0.5 %; 2 mL lidocaine 1 %; 80 mg methylPREDNISolone acetate 40 MG/ML Procedure, treatment alternatives, risks and benefits explained, specific risks discussed. Consent was given by the patient. Immediately prior to procedure a time out was called to verify the correct patient, procedure, equipment, support staff and site/side marked as required. Patient was prepped and draped in the usual sterile fashion.   Small Joint Inj: L thumb CMC on 12/09/2018 3:02 PM Details: 27 G needle, dorsal approach Medications: 0.5 mL lidocaine 1 %; 20 mg methylPREDNISolone acetate 40 MG/ML      Clinical Data: No additional findings.   Subjective: Chief Complaint  Patient presents with  . Left Hand - Pain  Patient presents today with left thumb pain X1 week. She points to her lateral side of her wrist as the area of pain. She said that it radiates  into her thumb and up to elbow. She said that the pain comes and goes. She said that the pain is worse at night and keeps her awake. She has tried Voltaren gel. She also presents with right knee pain X years. She said that she has had cortisone injections in the past with Dr.Safwan Tomei. The cortisone injection was beneficial and would like to get another one. Patient is diabetic.  HPI  Review of Systems   Objective: Vital Signs: BP 125/71   Pulse 84   Ht 5\' 4"  (1.626 m)   Wt 235 lb (106.6 kg)   BMI 40.34 kg/m   Physical Exam Constitutional:      Appearance: She is well-developed.  Eyes:     Pupils: Pupils are equal, round, and reactive to light.  Pulmonary:     Effort: Pulmonary effort is normal.  Skin:    General: Skin is warm and dry.  Neurological:     Mental Status: She is alert and oriented to person, place, and time.  Psychiatric:        Behavior: Behavior normal.     Ortho Exam awake alert and oriented x3.  Comfortable sitting.  Does have a limp with ambulation referable to her right knee.  Very minimal effusion right knee.  Mostly lateral full extension and flexed about 100  degrees without instability.  No popliteal pain.left thumb with tenderness at the base i.e. carpal metacarpal joint.  Positive grind test.  Appears to have some subluxation at the base of the joint neurologically intact  Specialty Comments:  No specialty comments available.  Imaging: Xr Finger Thumb Left  Result Date: 12/09/2018 Films of the left hand were obtained in several projections.  There is advanced osteoarthritis at the base of the thumb at the carpal metacarpal joint.  There is subluxation of at least 50% narrowing of the joint line.  There is ectopic calcification possibly consistent with loose bodies.  Is a large accessory ossicle at the thumb metacarpal phalangeal joint.  Dorsal calcification at the IP joint of the thumb.  Wrist joint appears to be intact    PMFS History: Patient  Active Problem List   Diagnosis Date Noted  . Chronic pain of right knee 12/09/2018  . Thumb pain, left 12/09/2018  . Dizziness and giddiness 01/14/2016  . Hepatic cirrhosis ([place]) 07/26/2015  . Nocturnal seizures ([place]) 05/14/2015  . Chronic hepatitis C without hepatic coma ([place]) 03/14/2015  . Vertigo, central 02/11/2014  . Difficulty walking 02/11/2014  . Diabetes mellitus ([place]) 02/11/2014  . Dysphagia 02/11/2014  . OBESITY 07/26/2010  . Essential hypertension, benign 07/26/2010  . DEGENERATIVE JOINT DISEASE, GENERALIZED 07/26/2010   Past Medical History:  Diagnosis Date  . Arthritis   . Cataract    left cataract removed in 2017  . Diabetes mellitus   . Diabetic neuropathy ([place])   . Diabetic neuropathy, type II diabetes mellitus ([place])   . Hepatitis C    took tx for   . Hypertension   . Hypokalemia   . Knee pain    right   . Morbidly obese ([place])   . Nocturnal seizures ([place]) 05/14/2015   no bad seizure since 1980's  . OSA on CPAP    uses cpap 2-3 week  . Plantar fasciitis    both feet    Family History  Problem Relation Age of Onset  . Diabetes Mother   . Heart attack Mother   . Diabetes Sister   . Diabetes Brother   . Cancer Sister        breast  . Breast cancer Sister   . Seizures Neg Hx     Past Surgical History:  Procedure Laterality Date  . ABDOMINAL HYSTERECTOMY     partial  . CATARACT EXTRACTION Left   . COLONOSCOPY WITH PROPOFOL N/A 08/21/2015   Procedure: COLONOSCOPY WITH PROPOFOL;  Surgeon: Carol Ada, MD;  Location: [facility] ENDOSCOPY;  Service: Endoscopy;  Laterality: N/A;  . ESOPHAGOGASTRODUODENOSCOPY (EGD) WITH PROPOFOL N/A 08/21/2015   Procedure: ESOPHAGOGASTRODUODENOSCOPY (EGD) WITH PROPOFOL;  Surgeon: Carol Ada, MD;  Location: [facility] ENDOSCOPY;  Service: Endoscopy;  Laterality: N/A;  . ESOPHAGOGASTRODUODENOSCOPY (EGD) WITH PROPOFOL N/A 10/08/2018   Procedure: ESOPHAGOGASTRODUODENOSCOPY (EGD) WITH PROPOFOL;  Surgeon: Carol Ada, MD;  Location: [facility]  ENDOSCOPY;  Service: Endoscopy;  Laterality: N/A;  . EYE SURGERY Bilateral    cataracts   Social History   Occupational History  . Occupation: [employer]  Tobacco Use  . Smoking status: Never Smoker  . Smokeless tobacco: Never Used  Substance and Sexual Activity  . Alcohol use: No    Alcohol/week: 0.0 standard drinks  . Drug use: No  . Sexual activity: Never

## 2018-12-13 ENCOUNTER — Encounter: Payer: Self-pay | Admitting: Neurology

## 2018-12-13 ENCOUNTER — Ambulatory Visit: Payer: PPO | Admitting: Neurology

## 2018-12-13 VITALS — BP 129/79 | HR 86 | Ht 64.0 in | Wt 236.0 lb

## 2018-12-13 DIAGNOSIS — E1142 Type 2 diabetes mellitus with diabetic polyneuropathy: Secondary | ICD-10-CM | POA: Diagnosis not present

## 2018-12-13 DIAGNOSIS — M130 Polyarthritis, unspecified: Secondary | ICD-10-CM | POA: Diagnosis not present

## 2018-12-13 DIAGNOSIS — G4733 Obstructive sleep apnea (adult) (pediatric): Secondary | ICD-10-CM

## 2018-12-13 DIAGNOSIS — G40909 Epilepsy, unspecified, not intractable, without status epilepticus: Secondary | ICD-10-CM | POA: Diagnosis not present

## 2018-12-13 DIAGNOSIS — G40001 Localization-related (focal) (partial) idiopathic epilepsy and epileptic syndromes with seizures of localized onset, not intractable, with status epilepticus: Secondary | ICD-10-CM | POA: Diagnosis not present

## 2018-12-13 DIAGNOSIS — R569 Unspecified convulsions: Secondary | ICD-10-CM

## 2018-12-13 DIAGNOSIS — E1169 Type 2 diabetes mellitus with other specified complication: Secondary | ICD-10-CM | POA: Diagnosis not present

## 2018-12-13 DIAGNOSIS — E1140 Type 2 diabetes mellitus with diabetic neuropathy, unspecified: Secondary | ICD-10-CM | POA: Insufficient documentation

## 2018-12-13 DIAGNOSIS — I10 Essential (primary) hypertension: Secondary | ICD-10-CM | POA: Diagnosis not present

## 2018-12-13 MED ORDER — LEVETIRACETAM 750 MG PO TABS: 1500.0000 mg | ORAL_TABLET | Freq: Two times a day (BID) | ORAL | 3 refills | Status: DC

## 2018-12-13 NOTE — Progress Notes (Signed)
Reason for visit: Nocturnal seizures  Cathy Gonzalez is an 73 y.o. female  History of present illness:  Ms. Grissett is a 73 year old right-handed black female with a history of obesity, diabetes, diabetic peripheral neuropathy, and nocturnal seizures.  The patient also indicates that she has sleep apnea, she has a CPAP machine but she does not tolerate the mask.  The patient indicates that when she tries to use a humidifier with the mask she has problems with the water getting in her throat, when she does not use a humidifier, her mouth dries out.  She wishes to have reevaluation of this issue.  The patient has had 1 brief nocturnal seizure in December 2019 associated with the time of stress when her sister and her sister's son died.  The patient has otherwise gone well over a year without any seizures, she is on Keppra taking 1500 mg twice daily.  The patient tolerates the drug fairly well.  She is also on Lyrica taking 300 mg twice daily.  She does have some mild gait instability, she has not had any falls.  Past Medical History:  Diagnosis Date  . Arthritis   . Cataract    left cataract removed in 2017  . Diabetes mellitus   . Diabetic neuropathy ([place])   . Diabetic neuropathy, type II diabetes mellitus ([place])   . Hepatitis C    took tx for   . Hypertension   . Hypokalemia   . Knee pain    right   . Morbidly obese ([place])   . Nocturnal seizures ([place]) 05/14/2015   no bad seizure since 1980's  . OSA on CPAP    uses cpap 2-3 week  . Plantar fasciitis    both feet    Past Surgical History:  Procedure Laterality Date  . ABDOMINAL HYSTERECTOMY     partial  . CATARACT EXTRACTION Left   . COLONOSCOPY WITH PROPOFOL N/A 08/21/2015   Procedure: COLONOSCOPY WITH PROPOFOL;  Surgeon: Carol Ada, MD;  Location: [facility];  Service: Endoscopy;  Laterality: N/A;  . ESOPHAGOGASTRODUODENOSCOPY (EGD) WITH PROPOFOL N/A 08/21/2015   Procedure: ESOPHAGOGASTRODUODENOSCOPY (EGD) WITH PROPOFOL;   Surgeon: Carol Ada, MD;  Location: [facility];  Service: Endoscopy;  Laterality: N/A;  . ESOPHAGOGASTRODUODENOSCOPY (EGD) WITH PROPOFOL N/A 10/08/2018   Procedure: ESOPHAGOGASTRODUODENOSCOPY (EGD) WITH PROPOFOL;  Surgeon: Carol Ada, MD;  [facility];  Service: Endoscopy;  Laterality: N/A;  . EYE SURGERY Bilateral    cataracts    Family History  Problem Relation Age of Onset  . Diabetes Mother   . Heart attack Mother   . Diabetes Sister   . Diabetes Brother   . Cancer Sister        breast  . Breast cancer Sister   . Seizures Neg Hx     Social history:  reports that she has never smoked. She has never used smokeless tobacco. She reports that she does not drink alcohol or use drugs.   No Known Allergies  Medications:  Prior to Admission medications   Medication Sig Start Date End Date Taking? Authorizing Provider  acetaminophen (TYLENOL) 500 MG tablet Take 500 mg by mouth every 6 (six) hours as needed for moderate pain.   Yes [provider]  amLODipine (NORVASC) 10 MG tablet Take 10 mg by mouth daily.   Yes [provider]  aspirin EC 81 MG tablet Take 1 tablet (81 mg total) by mouth daily. Patient taking differently: Take 81 mg by mouth daily  as needed (pt preferred).  02/12/14  Yes Rai, Ripudeep K, MD  chlorthalidone (HYGROTON) 25 MG tablet Take 25 mg by mouth daily.  12/10/16  Yes [provider]  colchicine 0.6 MG tablet Take 0.6 mg by mouth as needed (gout).    Yes [provider]  diclofenac sodium (VOLTAREN) 1 % GEL Apply 1 application topically 3 (three) times daily as needed (feet pain).   Yes [provider]  ezetimibe (ZETIA) 10 MG tablet Take 10 mg by mouth at bedtime.  02/16/18  Yes [provider]  FIASP FLEXTOUCH 100 UNIT/ML SOPN Inject 4-15 Units into the skin 3 (three) times daily with meals.  01/01/18  Yes [provider]  furosemide (LASIX) 40 MG tablet Take 40 mg by mouth daily.  12/03/16   Yes [provider]  insulin degludec (TRESIBA) 100 UNIT/ML SOPN FlexTouch Pen Inject 10 Units into the skin every evening.    Yes [provider]  levETIRAcetam (KEPPRA) 500 MG tablet TAKE 3 TABLETS BY MOUTH TWICE DAILY 03/02/18  Yes Kathrynn Ducking, MD  metFORMIN (GLUCOPHAGE) 500 MG tablet Take 500 mg by mouth at bedtime.    Yes [provider]  montelukast (SINGULAIR) 10 MG tablet Take 10 mg by mouth at bedtime.   Yes [provider]  ONE TOUCH ULTRA TEST test strip  02/06/18  Yes [provider]  ONETOUCH DELICA LANCETS 27O MISC  02/06/18  Yes [provider]  Potassium Chloride ER 20 MEQ TBCR Take 1 tablet by mouth 2 (two) times daily. 04/14/18  Yes [provider]  pregabalin (LYRICA) 300 MG capsule Take 1 capsule (300 mg total) by mouth 2 (two) times daily. 08/26/17  Yes Hyatt, Max T, DPM  sucralfate (CARAFATE) 1 G tablet Take 1 g by mouth 2 (two) times daily.    Yes [provider]  telmisartan (MICARDIS) 80 MG tablet Take 80 mg by mouth daily.  01/21/18  Yes [provider]    ROS:  Out of a complete 14 system review of symptoms, the patient complains only of the following symptoms, and all other reviewed systems are negative.  Aching muscles, walking difficulty Restless legs, snoring  Blood pressure 129/79, pulse 86, height 5\' 4"  (1.626 m), weight 236 lb (107 kg).  Physical Exam  General: The patient is alert and cooperative at the time of the examination.  The patient is markedly obese.  Skin: No significant peripheral edema is noted.   Neurologic Exam  Mental status: The patient is alert and oriented x 3 at the time of the examination. The patient has apparent normal recent and remote memory, with an apparently normal attention span and concentration ability.   Cranial nerves: Facial symmetry is present. Speech is normal, no aphasia or dysarthria is noted. Extraocular movements are full. Visual  fields are full.  Motor: The patient has good strength in all 4 extremities.  Sensory examination: Soft touch sensation is symmetric on the face, arms, and legs.  Coordination: The patient has good finger-nose-finger and heel-to-shin bilaterally.  Gait and station: The patient has a normal gait. Tandem gait is unsteady. Romberg is negative. No drift is seen.  Reflexes: Deep tendon reflexes are symmetric, but are depressed.   Assessment/Plan:  1.  Nocturnal seizures  2.  Sleep apnea, untreated  3.  Diabetic peripheral neuropathy  The patient will remain on Keppra taking 1500 mg twice daily, a prescription was sent in.  The patient had a seizure around the time  of significantly increased stress, otherwise she has done quite well on the [place].  The patient will be sent for sleep evaluation to devise a means of tolerating her treatment for sleep apnea which could have an impact on her nocturnal seizures.  The patient otherwise will follow-up here in about 1 year.  Jill Alexanders MD 12/13/2018 12:10 PM  [facility] [address] [place], [place] [address]  Phone [phone] Fax [phone]

## 2019-01-03 ENCOUNTER — Other Ambulatory Visit: Payer: Self-pay | Admitting: Family Medicine

## 2019-01-03 DIAGNOSIS — Z1231 Encounter for screening mammogram for malignant neoplasm of breast: Secondary | ICD-10-CM

## 2019-01-20 ENCOUNTER — Ambulatory Visit: Payer: PPO | Admitting: Podiatry

## 2019-01-24 DIAGNOSIS — M109 Gout, unspecified: Secondary | ICD-10-CM | POA: Diagnosis not present

## 2019-01-24 DIAGNOSIS — M1000 Idiopathic gout, unspecified site: Secondary | ICD-10-CM | POA: Diagnosis not present

## 2019-01-24 DIAGNOSIS — L03011 Cellulitis of right finger: Secondary | ICD-10-CM | POA: Diagnosis not present

## 2019-01-25 ENCOUNTER — Ambulatory Visit
Admission: RE | Admit: 2019-01-25 | Discharge: 2019-01-25 | Disposition: A | Discharge status: Home / Self Care | Payer: PPO | Source: Ambulatory Visit | Attending: Family Medicine | Admitting: Family Medicine

## 2019-01-25 ENCOUNTER — Other Ambulatory Visit: Payer: Self-pay | Admitting: Family Medicine

## 2019-01-25 ENCOUNTER — Telehealth: Payer: Self-pay | Admitting: Neurology

## 2019-01-25 ENCOUNTER — Other Ambulatory Visit: Payer: Self-pay

## 2019-01-25 DIAGNOSIS — G63 Polyneuropathy in diseases classified elsewhere: Secondary | ICD-10-CM

## 2019-01-25 DIAGNOSIS — M130 Polyarthritis, unspecified: Secondary | ICD-10-CM

## 2019-01-25 DIAGNOSIS — M1000 Idiopathic gout, unspecified site: Principal | ICD-10-CM

## 2019-01-25 DIAGNOSIS — L03011 Cellulitis of right finger: Secondary | ICD-10-CM

## 2019-01-25 DIAGNOSIS — R600 Localized edema: Secondary | ICD-10-CM | POA: Diagnosis not present

## 2019-01-25 NOTE — Telephone Encounter (Signed)
Due to current COVID 19 pandemic, our office is severely reducing in office visits until further notice, in order to minimize the risk to our patients and healthcare providers.   Called patient to offer a virtual visit for 4/29 appointment. Patient accepted and verbalized understanding of the steps involved in setting up the webex meeting. Patient understands that she will receive an e-mail with the link that she will use to connect and directions on how to do that. I advised patient that she will receive a phone call from RN to update chart history, as well as a call from office staff about 30 minutes prior to her appointment to complete the check-in process. Patient agreed.  Pt understands that although there may be some limitations with this type of visit, we will take all precautions to reduce any security or privacy concerns.  Pt understands that this will be treated like an in office visit and we will file with pt's insurance, and there may be a patient responsible charge related to this service.  Pt's email is [email or web address]. Pt understands that the [employer] webex software must be downloaded and operational on the device pt plans to use for the visit.

## 2019-01-27 NOTE — Telephone Encounter (Signed)
Called the patient to review their chart and make sure that everything was up to date. There was no answer. LVM for pt to call back.

## 2019-01-28 DIAGNOSIS — M1000 Idiopathic gout, unspecified site: Secondary | ICD-10-CM | POA: Diagnosis not present

## 2019-01-31 NOTE — Telephone Encounter (Signed)
Pt called back and she has rescheduled her sleep consult for 05-04.  Pt was advised that she will get a email with the new access code and password.  No call back requested

## 2019-01-31 NOTE — Telephone Encounter (Signed)
Apt updated and new e mail sent.

## 2019-02-02 ENCOUNTER — Institutional Professional Consult (permissible substitution): Payer: PPO | Admitting: Neurology

## 2019-02-02 ENCOUNTER — Encounter: Payer: Self-pay | Admitting: Neurology

## 2019-02-02 NOTE — Telephone Encounter (Signed)
Reviewed the pt chart.

## 2019-02-02 NOTE — Telephone Encounter (Signed)
Called the patient to review her chart with her to make sure everything was up to date for the upcoming apt. No answer. LVM instructing the pt to call back. I will also offer [employer] if patient has not downloaded the app

## 2019-02-07 ENCOUNTER — Encounter: Payer: Self-pay | Admitting: Neurology

## 2019-02-07 ENCOUNTER — Other Ambulatory Visit: Payer: Self-pay

## 2019-02-07 ENCOUNTER — Ambulatory Visit (INDEPENDENT_AMBULATORY_CARE_PROVIDER_SITE_OTHER): Payer: PPO | Admitting: Neurology

## 2019-02-07 DIAGNOSIS — R0683 Snoring: Secondary | ICD-10-CM

## 2019-02-07 DIAGNOSIS — G40909 Epilepsy, unspecified, not intractable, without status epilepticus: Secondary | ICD-10-CM | POA: Diagnosis not present

## 2019-02-07 DIAGNOSIS — K7460 Unspecified cirrhosis of liver: Secondary | ICD-10-CM

## 2019-02-07 DIAGNOSIS — I10 Essential (primary) hypertension: Secondary | ICD-10-CM | POA: Diagnosis not present

## 2019-02-07 DIAGNOSIS — Z9114 Patient's other noncompliance with medication regimen: Secondary | ICD-10-CM

## 2019-02-07 DIAGNOSIS — E1142 Type 2 diabetes mellitus with diabetic polyneuropathy: Secondary | ICD-10-CM

## 2019-02-07 DIAGNOSIS — R569 Unspecified convulsions: Secondary | ICD-10-CM

## 2019-02-07 NOTE — Progress Notes (Addendum)
Virtual Visit via Video Note  I connected with Cathy Gonzalez on 02/07/19 at  1:00 PM EDT by a video enabled telemedicine application and verified that I am speaking with the correct person using two identifiers.  Location: Patient: at home on a smart phone-  Provider: in the office   I discussed the limitations of evaluation and management by telemedicine and the availability of in person appointments. The patient expressed understanding and agreed to proceed.   SLEEP MEDICINE CLINIC   Provider:  Larey Seat, MD   Primary Care Physician:  [name], MD   Referring Provider:  Lenor Coffin, MD     HPI:  Cathy Gonzalez is a 73 y.o. female patient  wth known sleep apnea , but she couldn't use CPAP in the past.  He son has noted her snoring much louder than he remembered, and has recently spent time at her place.  Her original sleep study took place about 2012-13, ordered by Dr Criss Rosales. She had been diagnosed and ordered to use a CPAP from [facility], but "choked on the water ". She has had light sleep related seizures, the last in December 2019.  I could not find out remember to use CPAP the last time, but it seems that even when she used CPAP she will use it daily but only 2 or 3 times a week.  I made her aware that with that low compliance she would not be able to get the machine's cost covered, nor the supplies- equipment.   Chief complaint according to patient : "My son told me my snoring got worse- he visited from [place] In 01-08-23 -wants me to look into CPAP again".    Sleep and medical history: 'Nocturnal epilepsy" - treated by Dr Jannifer Franklin for seizures that only occur  at night- in sleep. She is  on Keppra and Lyrica. Morbid obesity- Gout, hyperlipidemia and DM - Insulin user, also on Metformin. DM related progressed Neuropathy and Osteoarthritis. Takes multiple antihypertensives and lasix.    Family sleep history: Mother with DM and heart disease. On insulin. She passed in  January 07, 2018.  Father died when she was 31, brother has DM, sister has DM, and breast cancer,   Social history: no coffee, no iced tea, no sodas.  She worked night shifts, late shifts - now is on day shift. Part time,  4 adult children, 3 sons and one daughter are all healthy.   Sleep habits are as follows: Cathy Gonzalez reports that her breakfast time is usually around 5:30 AM, her lunchtime at 1 and her dinnertime at 5PM or as late as 6 PM.  She goes to bed early at 9 PM and she describes her bedroom as dark but she states is not cool she usually keeps it over 70 degrees. Her bedroom is not quiet but did not explicitly tell me what she meant by this.   She sleeps mostly on her side, sometimes she sleeps on 2 ,sometimes on 3 pillows.  She goes to the bathroom at least twice at night and states that she takes diuretics.   Her first bathroom break is between midnight and 1 AM, her second between 3 and 4 AM , and she states that she does dream but not very often. She rises at 4.30 AM to go to work. She works part time.      Review of Systems: Out of a complete 14 system review, the patient complains of only the following symptoms, and all  other reviewed systems are negative.   How likely are you to doze in the following situations: 0 = not likely, 1 = slight chance, 2 = moderate chance, 3 = high chance  Sitting and Reading?3 Watching Television?3 Sitting inactive in a public place (theater or meeting)?1 Lying down in the afternoon when circumstances permit?3 Sitting and talking to someone?1 Sitting quietly after lunch without alcohol?2 In a car, while stopped for a few minutes in traffic?0 As a passenger in a car for an hour without a break?1  Total = 14/ 24   Son described thunderous snoring and interrupted breathing.       Social History   Socioeconomic History  . Marital status: Single    Spouse name: Not on file  . Number of children: 4  . Years of education: 31  . Highest education  level: Not on file  Occupational History  . Occupation: [employer]  Social Needs  . Financial resource strain: Not on file  . Food insecurity:    Worry: Not on file    Inability: Not on file  . Transportation needs:    Medical: Not on file    Non-medical: Not on file  Tobacco Use  . Smoking status: Never Smoker  . Smokeless tobacco: Never Used  Substance and Sexual Activity  . Alcohol use: No    Alcohol/week: 0.0 standard drinks  . Drug use: No  . Sexual activity: Never  Lifestyle  . Physical activity:    Days per week: Not on file    Minutes per session: Not on file  . Stress: Not on file  Relationships  . Social connections:    Talks on phone: Not on file    Gets together: Not on file    Attends religious service: Not on file    Active member of club or organization: Not on file    Attends meetings of clubs or organizations: Not on file    Relationship status: Not on file  . Intimate partner violence:    Fear of current or ex partner: Not on file    Emotionally abused: Not on file    Physically abused: Not on file    Forced sexual activity: Not on file  Other Topics Concern  . Not on file  Social History Narrative   Patient occasionally drinks caffeine.   Patient is right handed.    Family History  Problem Relation Age of Onset  . Diabetes Mother   . Heart attack Mother   . Diabetes Sister   . Diabetes Brother   . Cancer Sister        breast  . Breast cancer Sister   . Seizures Neg Hx     Past Medical History:  Diagnosis Date  . Arthritis   . Cataract    left cataract removed in 2017  . Diabetes mellitus   . Diabetic neuropathy ([place])   . Diabetic neuropathy, type II diabetes mellitus ([place])   . Hepatitis C    took tx for   . Hypertension   . Hypokalemia   . Knee pain    right   . Morbidly obese ([place])   . Nocturnal seizures ([place]) 05/14/2015   no bad seizure since 1980's  . OSA on CPAP    uses cpap 2-3 week  . Plantar fasciitis    both feet     Past Surgical History:  Procedure Laterality Date  . ABDOMINAL HYSTERECTOMY     partial  . CATARACT  EXTRACTION Left   . COLONOSCOPY WITH PROPOFOL N/A 08/21/2015   Procedure: COLONOSCOPY WITH PROPOFOL;  Surgeon: Carol Ada, MD;  [facility];  Service: Endoscopy;  Laterality: N/A;  . ESOPHAGOGASTRODUODENOSCOPY (EGD) WITH PROPOFOL N/A 08/21/2015   Procedure: ESOPHAGOGASTRODUODENOSCOPY (EGD) WITH PROPOFOL;  Surgeon: Carol Ada, MD;  [facility];  Service: Endoscopy;  Laterality: N/A;  . ESOPHAGOGASTRODUODENOSCOPY (EGD) WITH PROPOFOL N/A 10/08/2018   Procedure: ESOPHAGOGASTRODUODENOSCOPY (EGD) WITH PROPOFOL;  Surgeon: Carol Ada, MD;  [facility];  Service: Endoscopy;  Laterality: N/A;  . EYE SURGERY Bilateral    cataracts    Current Outpatient Medications  Medication Sig Dispense Refill  . acetaminophen (TYLENOL) 500 MG tablet Take 500 mg by mouth every 6 (six) hours as needed for moderate pain.    [name] amLODipine (NORVASC) 10 MG tablet Take 10 mg by mouth daily.    [name] aspirin EC 81 MG tablet Take 1 tablet (81 mg total) by mouth daily. (Patient taking differently: Take 81 mg by mouth daily as needed (pt preferred). ) 30 tablet 3  . cephALEXin (KEFLEX) 250 MG capsule Take 250 mg by mouth 4 (four) times daily.    . chlorthalidone (HYGROTON) 25 MG tablet Take 25 mg by mouth daily.     . colchicine 0.6 MG tablet Take 0.6 mg by mouth as needed (gout).     [name] diclofenac sodium (VOLTAREN) 1 % GEL Apply 1 application topically 3 (three) times daily as needed (feet pain).    [name] ezetimibe (ZETIA) 10 MG tablet Take 10 mg by mouth at bedtime.     [name] FIASP FLEXTOUCH 100 UNIT/ML SOPN Inject 4-15 Units into the skin 3 (three) times daily with meals.   2  . furosemide (LASIX) 40 MG tablet Take 40 mg by mouth daily.     . insulin degludec (TRESIBA) 100 UNIT/ML SOPN FlexTouch Pen Inject 10 Units into the skin every evening.     . levETIRAcetam (KEPPRA) 750 MG tablet Take 2  tablets (1,500 mg total) by mouth 2 (two) times daily. 360 tablet 3  . metFORMIN (GLUCOPHAGE) 500 MG tablet Take 500 mg by mouth at bedtime.     . montelukast (SINGULAIR) 10 MG tablet Take 10 mg by mouth at bedtime.    . ONE TOUCH ULTRA TEST test strip   5  . ONETOUCH DELICA LANCETS 27N MISC   3  . Potassium Chloride ER 20 MEQ TBCR Take 1 tablet by mouth 2 (two) times daily.  6  . pregabalin (LYRICA) 300 MG capsule Take 1 capsule (300 mg total) by mouth 2 (two) times daily. 60 capsule 0  . sucralfate (CARAFATE) 1 G tablet Take 1 g by mouth 2 (two) times daily.     [name] telmisartan (MICARDIS) 80 MG tablet Take 80 mg by mouth daily.   1   No current facility-administered medications for this visit.     Allergies as of 02/07/2019  . (No Known Allergies)    Vitals: There were no vitals taken for this visit. Last Weight:  Wt Readings from Last 1 Encounters:  12/13/18 236 lb (107 kg)   TZG:YFVCB is no height or weight on file to calculate BMI.     Last Height:   Ht Readings from Last 1 Encounters:  12/13/18 5\' 4"  (1.626 m)   Observation:   General: The patient is awake, alert and appears not in acute distress.  The exam and interview by video was extremely difficult as the patient moved her camera- phone  constantly, and also touched the screen on phone throughout the exam -after being multiple times obstructed to put the phone on a stand and leave it be.  Tv was running in the background, the Tv reflecting in he glasses and when I asked her to put the phone down on a table and lean it onto a book, flower pot, what ever - she placed the phone on the table facing the ceiling- we tried this 3 times before I gave up.      Head: Normocephalic, atraumatic.  Neck is supple. Mallampati*5,  neck circumference:* The patient was poorly prepared for this visit, she did not measure her neck, she did not answer the Epworth sleepiness questions that were sent to her by email. estimated neck size 19"  retrognathia.  . Nasal airflow - patent.   Retrognathia is iseen.  Cardiovascular:   without distended neck veins. Respiratory: Lungs are clear to auscultation. Skin:  Unable to see- Trunk: BMI is  40.5 kg/m2. The patient's posture is difficult to judge as she is seated in an armchair and can not demonstrate her gait in that location-( the phone camera points to the ceiling once more).  Neurologic exam : The patient is awake and alert,   Attention span & concentration ability appears impaired- she is unable to recall the location or the date of her last sleep study ( somewhere between [place] and [place]- 2012 or 2013 ?   Speech is fluent, but the patient speaks very loudly. Suspect severe hearing loss.  Mood and affect are appropriate.  Cranial nerves: denies loss of taste or smell  Extraocular movements  in vertical and horizontal planes intact . Hearing appears severely impaired.  Facial sensation intact to fine touch.  Facial motor strength is symmetric and the tongue and uvula move midline. Shoulder shrug was symmetrical.   Motor exam:  Normal tone, muscle bulk and symmetric strength in all extremities. Sensory:  deferred Coordination:  Finger-to-nose maneuver again unable to demonstrate - keeps phone in her hand during maneuver, phone is always in  Motion and she is not following any of my requests to [place] PHONE IN A PLACE WHERE SHE CAN BE SEEN AND NOT TO TOUCH IT.  Gait and station: Patient rises with difficulty from her armchair.    Dear Dr. Jannifer Franklin,  There is no evidence that this patient's baseline risk factors for obstructive sleep apnea have changed since her last sleep study.  She has been diabetic, super-obese and  hypertensive for over a decade.  This seems to be also a comprehension problem.   I became flustered at the time this very protracted video conference was finally concluding.   The patient had not prepared for the visit, she had not answered the  Epworth score, not measured her neck circumference, poorly chosen was in a location where she did not even switch the TV off, the room was poorly lit and she did not rest her phone on any kind of stand so that she would be able to demonstrate a motor exam cranial nerve exam etc. Constantly scratching on the phone caused a lot of additional noise.   It was the most exhausting video visit.  Assessment and Plan:  OSA in a morbidly obese patient with a BMI that allows for obesity hypoventilation, poorly controlled DM and Neuropathy, and SOB with minimal exertion.   Follow Up Instructions:  She will undergo a HST and this helps to give [place] an impression about the severity  of the apnea.  Based on that she can start therapy. She will follow NP after her HST .    I discussed the assessment and treatment plan with the patient. The patient was provided an opportunity to ask questions and all were answered. The patient agreed with the plan and demonstrated an understanding of the instructions.   The patient was advised to call back or seek an in-person evaluation if the symptoms worsen or if the condition fails to improve as anticipated.  I provided over 30 minutes of non-face-to-face time during this encounter.       Larey Seat, MD 02/06/2991, 4:26 PM  Certified in Neurology by ABPN Certified in [place] by [facility] [address] [place], [place] [address]            History of Present Illness:    Observations/Objective:

## 2019-02-08 ENCOUNTER — Other Ambulatory Visit: Payer: Self-pay

## 2019-02-08 NOTE — Patient Outreach (Signed)
[facility]) Care Management  02/08/2019   Cathy Gonzalez 1945-10-09 [id number]  Subjective: Return call from the patient.  Two patient identifier given.   The patient states that she has been doing well.  She denies any falls. She states that she has chronic pain in her feet from gout.  She rates the pain at 6/10.  She takes colchicine 0.6 mg as needed.   She states her blood sugars have been good.  Her FBS range 99-102.  She states that she had a visit two weeks ago with her primary care and has another appointment next Friday to go over her blood work results.  She states that she has all of her medications (takes them as prescribed) and food in the home.  She has transportation to go out and get the things she needs.  She states that when she leaves the home she wears a mask and follows precautionary measures.  Current Medications:  Current Outpatient Medications  Medication Sig Dispense Refill  . acetaminophen (TYLENOL) 500 MG tablet Take 500 mg by mouth every 6 (six) hours as needed for moderate pain.    [name] amLODipine (NORVASC) 10 MG tablet Take 10 mg by mouth daily.    [name] aspirin EC 81 MG tablet Take 1 tablet (81 mg total) by mouth daily. (Patient taking differently: Take 81 mg by mouth daily as needed (pt preferred). ) 30 tablet 3  . cephALEXin (KEFLEX) 250 MG capsule Take 250 mg by mouth 4 (four) times daily.    . chlorthalidone (HYGROTON) 25 MG tablet Take 25 mg by mouth daily.     . colchicine 0.6 MG tablet Take 0.6 mg by mouth as needed (gout).     [name] diclofenac sodium (VOLTAREN) 1 % GEL Apply 1 application topically 3 (three) times daily as needed (feet pain).    [name] ezetimibe (ZETIA) 10 MG tablet Take 10 mg by mouth at bedtime.     [name] FIASP FLEXTOUCH 100 UNIT/ML SOPN Inject 4-15 Units into the skin 3 (three) times daily with meals.   2  . furosemide (LASIX) 40 MG tablet Take 40 mg by mouth daily.     . insulin degludec (TRESIBA) 100 UNIT/ML SOPN FlexTouch Pen Inject 10  Units into the skin every evening.     . levETIRAcetam (KEPPRA) 750 MG tablet Take 2 tablets (1,500 mg total) by mouth 2 (two) times daily. 360 tablet 3  . metFORMIN (GLUCOPHAGE) 500 MG tablet Take 500 mg by mouth at bedtime.     . montelukast (SINGULAIR) 10 MG tablet Take 10 mg by mouth at bedtime.    . ONE TOUCH ULTRA TEST test strip   5  . ONETOUCH DELICA LANCETS 61P MISC   3  . Potassium Chloride ER 20 MEQ TBCR Take 1 tablet by mouth 2 (two) times daily.  6  . pregabalin (LYRICA) 300 MG capsule Take 1 capsule (300 mg total) by mouth 2 (two) times daily. 60 capsule 0  . sucralfate (CARAFATE) 1 G tablet Take 1 g by mouth 2 (two) times daily.     [name] telmisartan (MICARDIS) 80 MG tablet Take 80 mg by mouth daily.   1   No current facility-administered medications for this visit.     Functional Status:  In your present state of health, do you have any difficulty performing the following activities: 03/22/2018 02/23/2018  Hearing? N N  Vision? N N  Difficulty concentrating or making decisions? N N  Walking or climbing  stairs? N N  Dressing or bathing? N N  Doing errands, shopping? N N  Preparing Food and eating ? N N  Using the Toilet? N N  In the past six months, have you accidently leaked urine? N N  Do you have problems with loss of bowel control? N N  Managing your Medications? N N  Managing your Finances? N N  Housekeeping or managing your Housekeeping? N N  Some recent data might be hidden    Fall/Depression Screening: Fall Risk  02/08/2019 02/08/2019 11/11/2018  Falls in the past year? 0 0 0   PHQ 2/9 Scores 02/23/2018 10/20/2016 02/01/2016 01/30/2016 11/07/2015 10/11/2015 10/11/2015  PHQ - 2 Score 1 0 0 0 0 0 0    Assessment: Patient will continue to benefit from health coach outreach for disease management and support. THN CM Care Plan Problem One     Most Recent Value  THN Long Term Goal   In the next 90 days , patient will demonstrate ongoing improved diabetes self management skills.   THN Long Term Goal Start Date  02/08/19  Interventions for Problem One Long Term Goal  Discussed CBG reading, medication adherence encouraged, exercise encouraged and reviewed precaution meassures for covid -19.     Plan: RN Health Coach will contact patient in the month of August and patient agrees to next outreach.   [name] RN, BSN, [place] Direct Dial:  [phone]  Fax: [phone]

## 2019-02-08 NOTE — Patient Outreach (Signed)
[facility]) Care Management  02/08/2019  [name] 12/05/1945 [id number]    1st unsuccessful outreach to the patient.  No answer. HIPAA compliant voicemail left with contact information.  Plan: RN Health Coach will send letter. [place] will make outreach attempt to the patient within thirty business days.  Cathy Arms RN, BSN, [place] Direct Dial:  [phone]  Fax: [phone]

## 2019-02-10 ENCOUNTER — Other Ambulatory Visit: Payer: Self-pay | Admitting: Pharmacist

## 2019-02-10 NOTE — Patient Outreach (Signed)
[place] [facility]) Care Management  [place] 02/10/2019  CHAVON LUCARELLI [birth year] [id number]  Patient assisted with medication assistance in 2019 for Fiasp and Levemir. Medication list reviewed in [facility].  Both medications remain on patient's current medication list.  [facility] will proactively outreach patient to ask if she needs assistance again in 2020 with applying for patient assistance programs through [employer].    Ralene Bathe, PharmD, [place] [phone]

## 2019-02-11 DIAGNOSIS — I10 Essential (primary) hypertension: Secondary | ICD-10-CM | POA: Diagnosis not present

## 2019-02-11 DIAGNOSIS — L03011 Cellulitis of right finger: Secondary | ICD-10-CM | POA: Diagnosis not present

## 2019-02-23 ENCOUNTER — Other Ambulatory Visit: Payer: Self-pay | Admitting: Pharmacy Technician

## 2019-02-23 NOTE — Patient Outreach (Addendum)
[place] [facility]) Care Management  02/23/2019  Cathy Gonzalez 1946/02/11 [id number]   Outreach call made to Cathy Gonzalez to inquire if she would like to start the process for patient assistance for her [place] and [place] thru [employer]. Cathy Gonzalez is in agreement with starting process. Will mail application to her and fax provider portion to Dr. Criss Rosales.  Will follow up with with patient in 7-10 business days to confirm application has been received.  [name] [place] Certified Pharmacy Technician [place] Management Direct [phone]

## 2019-03-07 ENCOUNTER — Ambulatory Visit: Payer: PPO

## 2019-03-07 ENCOUNTER — Ambulatory Visit: Payer: PPO | Admitting: Adult Health

## 2019-03-08 ENCOUNTER — Ambulatory Visit: Payer: Self-pay

## 2019-03-09 ENCOUNTER — Ambulatory Visit: Payer: PPO | Admitting: Podiatry

## 2019-03-09 ENCOUNTER — Ambulatory Visit (INDEPENDENT_AMBULATORY_CARE_PROVIDER_SITE_OTHER): Payer: PPO | Admitting: Neurology

## 2019-03-09 ENCOUNTER — Other Ambulatory Visit: Payer: Self-pay

## 2019-03-09 ENCOUNTER — Encounter: Payer: Self-pay | Admitting: Podiatry

## 2019-03-09 DIAGNOSIS — M79675 Pain in left toe(s): Secondary | ICD-10-CM | POA: Diagnosis not present

## 2019-03-09 DIAGNOSIS — R569 Unspecified convulsions: Secondary | ICD-10-CM

## 2019-03-09 DIAGNOSIS — G4733 Obstructive sleep apnea (adult) (pediatric): Secondary | ICD-10-CM | POA: Diagnosis not present

## 2019-03-09 DIAGNOSIS — K7460 Unspecified cirrhosis of liver: Secondary | ICD-10-CM

## 2019-03-09 DIAGNOSIS — M79674 Pain in right toe(s): Secondary | ICD-10-CM

## 2019-03-09 DIAGNOSIS — L84 Corns and callosities: Secondary | ICD-10-CM

## 2019-03-09 DIAGNOSIS — E1142 Type 2 diabetes mellitus with diabetic polyneuropathy: Secondary | ICD-10-CM

## 2019-03-09 DIAGNOSIS — B351 Tinea unguium: Secondary | ICD-10-CM | POA: Diagnosis not present

## 2019-03-09 DIAGNOSIS — R0683 Snoring: Secondary | ICD-10-CM

## 2019-03-09 DIAGNOSIS — Z9114 Patient's other noncompliance with medication regimen: Secondary | ICD-10-CM

## 2019-03-09 DIAGNOSIS — G40909 Epilepsy, unspecified, not intractable, without status epilepticus: Secondary | ICD-10-CM

## 2019-03-09 DIAGNOSIS — I10 Essential (primary) hypertension: Secondary | ICD-10-CM

## 2019-03-09 NOTE — Patient Instructions (Signed)
Diabetes Mellitus and Foot Care  Foot care is an important part of your health, especially when you have diabetes. Diabetes may cause you to have problems because of poor blood flow (circulation) to your feet and legs, which can cause your skin to:   Become thinner and drier.   Break more easily.   Heal more slowly.   Peel and crack.  You may also have nerve damage (neuropathy) in your legs and feet, causing decreased feeling in them. This means that you may not notice minor injuries to your feet that could lead to more serious problems. Noticing and addressing any potential problems early is the best way to prevent future foot problems.  How to care for your feet  Foot hygiene   Wash your feet daily with warm water and mild soap. Do not use hot water. Then, pat your feet and the areas between your toes until they are completely dry. Do not soak your feet as this can dry your skin.   Trim your toenails straight across. Do not dig under them or around the cuticle. File the edges of your nails with an emery board or nail file.   Apply a moisturizing lotion or petroleum jelly to the skin on your feet and to dry, brittle toenails. Use lotion that does not contain alcohol and is unscented. Do not apply lotion between your toes.  Shoes and socks   Wear clean socks or stockings every day. Make sure they are not too tight. Do not wear knee-high stockings since they may decrease blood flow to your legs.   Wear shoes that fit properly and have enough cushioning. Always look in your shoes before you put them on to be sure there are no objects inside.   To break in new shoes, wear them for just a few hours a day. This prevents injuries on your feet.  Wounds, scrapes, corns, and calluses   Check your feet daily for blisters, cuts, bruises, sores, and redness. If you cannot see the bottom of your feet, use a mirror or ask someone for help.   Do not cut corns or calluses or try to remove them with medicine.   If you  find a minor scrape, cut, or break in the skin on your feet, keep it and the skin around it clean and dry. You may clean these areas with mild soap and water. Do not clean the area with peroxide, alcohol, or iodine.   If you have a wound, scrape, corn, or callus on your foot, look at it several times a day to make sure it is healing and not infected. Check for:  ? Redness, swelling, or pain.  ? Fluid or blood.  ? Warmth.  ? Pus or a bad smell.  General instructions   Do not cross your legs. This may decrease blood flow to your feet.   Do not use heating pads or hot water bottles on your feet. They may burn your skin. If you have lost feeling in your feet or legs, you may not know this is happening until it is too late.   Protect your feet from hot and cold by wearing shoes, such as at the beach or on hot pavement.   Schedule a complete foot exam at least once a year (annually) or more often if you have foot problems. If you have foot problems, report any cuts, sores, or bruises to your health care provider immediately.  Contact a health care provider if:     You have a medical condition that increases your risk of infection and you have any cuts, sores, or bruises on your feet.   You have an injury that is not healing.   You have redness on your legs or feet.   You feel burning or tingling in your legs or feet.   You have pain or cramps in your legs and feet.   Your legs or feet are numb.   Your feet always feel cold.   You have pain around a toenail.  Get help right away if:   You have a wound, scrape, corn, or callus on your foot and:  ? You have pain, swelling, or redness that gets worse.  ? You have fluid or blood coming from the wound, scrape, corn, or callus.  ? Your wound, scrape, corn, or callus feels warm to the touch.  ? You have pus or a bad smell coming from the wound, scrape, corn, or callus.  ? You have a fever.  ? You have a red line going up your leg.  Summary   Check your feet every day  for cuts, sores, red spots, swelling, and blisters.   Moisturize feet and legs daily.   Wear shoes that fit properly and have enough cushioning.   If you have foot problems, report any cuts, sores, or bruises to your health care provider immediately.   Schedule a complete foot exam at least once a year (annually) or more often if you have foot problems.  This information is not intended to replace advice given to you by your health care provider. Make sure you discuss any questions you have with your health care provider.  Document Released: 09/19/2000 Document Revised: 11/04/2017 Document Reviewed: 10/24/2016  Elsevier Interactive Patient Education  2019 Elsevier Inc.

## 2019-03-13 NOTE — Progress Notes (Signed)
Subjective: Cathy Gonzalez presents for preventative diabetic foot care on today She has history of neuropathy. She is seen for follow up of  chronic, painful mycotic toenails and callus(es)/corn(s) which interfere with daily activities and routine tasks.  Pain is aggravated when wearing enclosed shoe gear. Pain is getting progressively worse and relieved with periodic professional debridement.   [name], MD is her PCP.  She feels her neuropathy symptoms are managed with her current dosage of Lyrica.  Blood sugar was 230 mg/dl this morning.  She is requesting diabetic shoes on today's visit.   Current Outpatient Medications:  .  acetaminophen (TYLENOL) 500 MG tablet, Take 500 mg by mouth every 6 (six) hours as needed for moderate pain., Disp: , Rfl:  .  amLODipine (NORVASC) 10 MG tablet, Take 10 mg by mouth daily., Disp: , Rfl:  .  aspirin EC 81 MG tablet, Take 1 tablet (81 mg total) by mouth daily. (Patient taking differently: Take 81 mg by mouth daily as needed (pt preferred). ), Disp: 30 tablet, Rfl: 3 .  Blood Glucose Monitoring Suppl (ONE TOUCH ULTRA 2) w/Device KIT, 2 (two) times daily. as directed, Disp: , Rfl:  .  cephALEXin (KEFLEX) 250 MG capsule, Take 250 mg by mouth 4 (four) times daily., Disp: , Rfl:  .  chlorthalidone (HYGROTON) 25 MG tablet, Take 25 mg by mouth daily. , Disp: , Rfl:  .  colchicine 0.6 MG tablet, Take 0.6 mg by mouth as needed (gout). , Disp: , Rfl:  .  diclofenac sodium (VOLTAREN) 1 % GEL, Apply 1 application topically 3 (three) times daily as needed (feet pain)., Disp: , Rfl:  .  ezetimibe (ZETIA) 10 MG tablet, Take 10 mg by mouth at bedtime. , Disp: , Rfl:  .  FIASP FLEXTOUCH 100 UNIT/ML SOPN, Inject 4-15 Units into the skin 3 (three) times daily with meals. , Disp: , Rfl: 2 .  furosemide (LASIX) 40 MG tablet, Take 40 mg by mouth daily. , Disp: , Rfl:  .  insulin degludec (TRESIBA) 100 UNIT/ML SOPN FlexTouch Pen, Inject 10 Units into the skin every  evening. , Disp: , Rfl:  .  levETIRAcetam (KEPPRA) 750 MG tablet, Take 2 tablets (1,500 mg total) by mouth 2 (two) times daily., Disp: 360 tablet, Rfl: 3 .  metFORMIN (GLUCOPHAGE) 500 MG tablet, Take 500 mg by mouth at bedtime. , Disp: , Rfl:  .  montelukast (SINGULAIR) 10 MG tablet, Take 10 mg by mouth at bedtime., Disp: , Rfl:  .  ONE TOUCH ULTRA TEST test strip, , Disp: , Rfl: 5 .  ONETOUCH DELICA LANCETS 50H MISC, , Disp: , Rfl: 3 .  Potassium Chloride ER 20 MEQ TBCR, Take 1 tablet by mouth 2 (two) times daily., Disp: , Rfl: 6 .  pregabalin (LYRICA) 300 MG capsule, Take 1 capsule (300 mg total) by mouth 2 (two) times daily., Disp: 60 capsule, Rfl: 0 .  sucralfate (CARAFATE) 1 G tablet, Take 1 g by mouth 2 (two) times daily. , Disp: , Rfl:  .  telmisartan (MICARDIS) 80 MG tablet, Take 80 mg by mouth daily. , Disp: , Rfl: 1  No Known Allergies  Objective: There were no vitals filed for this visit.  Vascular Examination: Capillary refill time immediate x 10 digits.  Dorsalis pedis pulses present b/l.  Posterior tibial pulses present b/l.  No digital hair x 10 digits.  Skin temperature gradient WNL b/l.  Trace edema BLE.   Dermatological Examination: Skin with normal turgor,  texture and tone b/l.  Toenails 2-5 left, 3-5 right discolored, thick, dystrophic with subungual debris and pain with palpation to nailbeds due to thickness of nails.  Anonychia b/l great toes and right 2nd digit with evidence of permanent total nail avulsions. Nailbed(s) completely epithelialized and intact.  Hyperkeratotic lesion(s) submet head 5 b/l, lateral 5th digit b/l. No erythema, no edema, no drainage, no flocculence noted.   Musculoskeletal: Muscle strength 5/5 to all LE muscle groups b/l.  HAV with bunion deformity b/l.  Hammertoes  Neurological: Sensation diminished with 10 gram monofilament.  Vibratory sensation absent b/l.  Assessment: 1. Painful onychomycosis toenails 1-5  b/l 2. Calluses submet head 5 b/l 3. Corns lateral 5th digits b/l 4. NIDDM with Neuropathy  Plan: 1. Toenails 1-5 b/l were debrided in length and girth without iatrogenic bleeding. 2. Calluses pared submetatarsal head(s) utilizing sterile scalpel blade without incident. Corn(s) pared utilizing sterile scalpel blade without incident.  3. Patient to continue soft, supportive shoe gear daily.Per Medicare guidelines, patient's feet need to be evaluated by an MD/DO managing patient's diabetes and diabetic shoe certification form needs to be signed by the MD/DO. If patient's diabetes is being managed by an Endocrinologist, the Endocrinologist must evaluate patient's feet and sign the Medicare diabetic shoe certification form. We will send appropriate paperwork to her PCP for her shoes. 4. Patient to report any pedal injuries to medical professional immediately. 5. Follow up 3 months.  6. Patient/POA to call should there be a concern in the interim.

## 2019-03-14 DIAGNOSIS — M62838 Other muscle spasm: Secondary | ICD-10-CM | POA: Diagnosis not present

## 2019-03-14 DIAGNOSIS — E1169 Type 2 diabetes mellitus with other specified complication: Secondary | ICD-10-CM | POA: Diagnosis not present

## 2019-03-14 DIAGNOSIS — E6609 Other obesity due to excess calories: Secondary | ICD-10-CM | POA: Diagnosis not present

## 2019-03-14 DIAGNOSIS — M130 Polyarthritis, unspecified: Secondary | ICD-10-CM | POA: Diagnosis not present

## 2019-03-21 NOTE — Procedures (Signed)
Patient Information     First Name: Cathy Last Name: Gonzalez ID: [id number]  Birth Date: 1946/07/21 Age: 73 Gender: Female  Referring Provider: [name] , MD BMI: 40.3 (W=236 lbs, H=5' 4'')  Neck Circ.:  19 '' Epworth:  14/24   Sleep Study Information    Study Date: Mar 09, 2019 S/H/A Version: [id number] / 4.0.1515 / 85  History: Cathy Gonzalez is a 73 y.o. female patient with known sleep apnea but unable to use CPAP in the past. Her son has recently noted her snoring much louder than he remembered when he had recently spent time at her place. Her original sleep study took place about 2012-13, ordered by Dr. Criss Rosales. She had been diagnosed and used a CPAP from [facility], but "choked on the water ". She has had sleep related seizures, the last in December 2019.  I could not find out remember to use CPAP the last time, but it seems that even when she used CPAP she will use it daily but only 2 or 3 times a week.  PS:  I made her aware that with that low compliance she would not be able to get the machine's cost covered, nor the supplies and equipment.         Summary & Diagnosis:    1) Severe obstructive Sleep apnea at an AHI of 60.6/h and mildly accentuated in REM sleep.  2) Normal heart rate variability noted.  3) REM sleep accentuated hypoxic event index.   Recommendations:      The patient should be best treated with PAP therapy, and I will order an autotitration capable device with a pressure window from 5-16 cm water with 3 cm EPR for her, heated humidity and her choice of interface. The patient will follow up with Np in 90 days, will need to use the device more than 4 hours every night.   Electronically Signed:  Larey Seat, MD    03-21-2019            Sleep Summary    Oxygen Saturation Statistics     Start Study Time: End Study Time: Total Recording Time:  9:12:32 PM 4:18:21 AM 7 h, 5 min  Total Sleep Time % REM of Sleep Time:  6 h, 16 min  14.7    Mean: 92  Minimum: 71 Maximum: 100  Mean of Desaturations Nadirs (%):   88  Oxygen Desaturation. %:   4-9 10-20 >20 Total  Events Number Total   200  28 87.3 12.2  1 0.4  229 100.0  Oxygen Saturation: <90 <=88 <85 <80 <70  Duration (minutes): Sleep % 46.7 12.4 36.9 10.5 9.8 2.8 1.4 0.4 0.0 0.0     Respiratory Indices      Total Events REM NREM All Night  pRDI:  379  pAHI:  379 ODI:  229  pAHIc:  16  % CSR: 0.0 69.5 69.5 58.7 3.3 59.1 59.1 32.8 2.4 60.6 60.6 36.6 2.6       Pulse Rate Statistics during Sleep (BPM)      Mean: 77 Minimum: 63 Maximum: 101    Indices are calculated using technically valid sleep time of  6 hrs, 14 min. pRDI/pAHI are calculated using oxi desaturations ? 3%  Body Position Statistics  Position Supine Prone Right Left Non-Supine  Sleep (min) 25.3 21.0 81.5 248.5 351.0  Sleep % 6.7 5.6 21.7 66.0 93.3  pRDI 48.5 68.8 73.7 56.9 61.5  pAHI 48.5 68.8 73.7 56.9 61.5  ODI 46.1 48.8 44.9 32.0 36.0     Snoring Statistics Snoring Level (dB) >40 >50 >60 >70 >80 >Threshold (45)  Sleep (min) 351.7 188.0 86.8 0.0 0.0 280.8  Sleep % 93.5 50.0 23.1 0.0 0.0 74.6    Mean: 52 dB Sleep Stages Chart                                                                             pAHI=60.6                                                                                              Mild              Moderate                    Severe                                                 5              15                    30

## 2019-03-21 NOTE — Addendum Note (Signed)
Addended by: Larey Seat on: 03/21/2019 04:30 PM   Modules accepted: Orders

## 2019-03-22 ENCOUNTER — Telehealth: Payer: Self-pay | Admitting: Neurology

## 2019-03-22 NOTE — Telephone Encounter (Signed)
Pt returned call

## 2019-03-22 NOTE — Telephone Encounter (Signed)
-----   Message from Larey Seat, MD sent at 03/21/2019  4:30 PM EDT -----  Summary & Diagnosis:   1) Severe obstructive Sleep apnea at an AHI of 60.6/h and mildly accentuated in REM sleep.  2) Normal heart rate variability noted.  3) REM sleep accentuated hypoxic event index.   Recommendations:     The patient should be best treated with PAP therapy, and I will order an autotitration capable device with a pressure window from 5-16 cm water with 3 cm EPR for her, heated humidity and her choice of interface. The patient will follow up with Np in 90 days, will need to use the device more than 4 hours every night.   Electronically Signed:  Larey Seat, MD    03-21-2019

## 2019-03-22 NOTE — Telephone Encounter (Signed)
Called patient to discuss sleep study results. No answer at this time. LVM for the patient to call back.   

## 2019-03-22 NOTE — Telephone Encounter (Signed)
I called pt. I advised pt that Dr. Brett Fairy reviewed their sleep study results and found that pt severe sleep apnea. Dr. Brett Fairy recommends that pt starts auto CPAP. I reviewed PAP compliance expectations with the pt. Pt is agreeable to starting a CPAP. I advised pt that an order will be sent to a DME, [facility], and [facility] will call the pt within about one week after they file with the pt's insurance. [facility] will show the pt how to use the machine, fit for masks, and troubleshoot the CPAP if needed. A follow up appt was made for insurance purposes with Debbora Presto, NP on Aug 10,2020 8:30 am. Pt verbalized understanding to arrive 15 minutes early and bring their CPAP. A letter with all of this information in it will be mailed to the pt as a reminder. I verified with the pt that the address we have on file is correct. Pt verbalized understanding of results. Pt had no questions at this time but was encouraged to call back if questions arise. I have sent the order to [facility] and have received confirmation that they have received the order.

## 2019-03-25 ENCOUNTER — Other Ambulatory Visit: Payer: Self-pay

## 2019-03-25 NOTE — Patient Outreach (Signed)
[place] [facility]) Care Management  03/25/2019  Cathy Gonzalez 02/03/1946 [id number]    RN Health Coach closing the program.  Patient is transitioning to external program [facility] for continued case management.  [name] RN, BSN, [place] Direct Dial:  [phone]  Fax: [phone]

## 2019-03-28 ENCOUNTER — Other Ambulatory Visit: Payer: Self-pay | Admitting: Pharmacy Technician

## 2019-03-28 NOTE — Patient Outreach (Signed)
[place] [facility]) Care Management  03/28/2019  SHERILYNN DIEU 1946-05-03 [id number]   Received patient portion(s) of patient assistance application for [place] and FI[place]. Re-faxed provider portion of application to Dr. Perley Jain office.  Will submit completed application to [employer] once all documents have been received.  [name] [place] Certified Pharmacy Technician [place] Management Direct Dial:[phone]

## 2019-03-29 DIAGNOSIS — G4733 Obstructive sleep apnea (adult) (pediatric): Secondary | ICD-10-CM | POA: Diagnosis not present

## 2019-04-05 DIAGNOSIS — M130 Polyarthritis, unspecified: Secondary | ICD-10-CM | POA: Diagnosis not present

## 2019-04-05 DIAGNOSIS — G4730 Sleep apnea, unspecified: Secondary | ICD-10-CM | POA: Diagnosis not present

## 2019-04-05 DIAGNOSIS — I10 Essential (primary) hypertension: Secondary | ICD-10-CM | POA: Diagnosis not present

## 2019-04-05 DIAGNOSIS — E1169 Type 2 diabetes mellitus with other specified complication: Secondary | ICD-10-CM | POA: Diagnosis not present

## 2019-04-11 ENCOUNTER — Other Ambulatory Visit: Payer: Self-pay

## 2019-04-11 ENCOUNTER — Ambulatory Visit: Payer: PPO | Admitting: Orthotics

## 2019-04-11 DIAGNOSIS — E1142 Type 2 diabetes mellitus with diabetic polyneuropathy: Secondary | ICD-10-CM

## 2019-04-11 DIAGNOSIS — L84 Corns and callosities: Secondary | ICD-10-CM

## 2019-04-11 DIAGNOSIS — B351 Tinea unguium: Secondary | ICD-10-CM

## 2019-04-11 DIAGNOSIS — Q828 Other specified congenital malformations of skin: Secondary | ICD-10-CM

## 2019-04-11 DIAGNOSIS — E1140 Type 2 diabetes mellitus with diabetic neuropathy, unspecified: Secondary | ICD-10-CM

## 2019-04-11 DIAGNOSIS — D3613 Benign neoplasm of peripheral nerves and autonomic nervous system of lower limb, including hip: Secondary | ICD-10-CM

## 2019-04-11 DIAGNOSIS — M722 Plantar fascial fibromatosis: Secondary | ICD-10-CM

## 2019-04-11 DIAGNOSIS — M79675 Pain in left toe(s): Secondary | ICD-10-CM

## 2019-04-11 DIAGNOSIS — M79674 Pain in right toe(s): Secondary | ICD-10-CM

## 2019-04-11 DIAGNOSIS — E1149 Type 2 diabetes mellitus with other diabetic neurological complication: Secondary | ICD-10-CM

## 2019-04-11 NOTE — Progress Notes (Signed)

## 2019-04-14 ENCOUNTER — Ambulatory Visit
Admission: RE | Admit: 2019-04-14 | Discharge: 2019-04-14 | Disposition: A | Discharge status: Home / Self Care | Payer: PPO | Source: Ambulatory Visit | Attending: Family Medicine | Admitting: Family Medicine

## 2019-04-14 DIAGNOSIS — Z1231 Encounter for screening mammogram for malignant neoplasm of breast: Secondary | ICD-10-CM

## 2019-04-28 DIAGNOSIS — G4733 Obstructive sleep apnea (adult) (pediatric): Secondary | ICD-10-CM | POA: Diagnosis not present

## 2019-05-02 ENCOUNTER — Other Ambulatory Visit: Payer: Self-pay | Admitting: Pharmacy Technician

## 2019-05-02 NOTE — Patient Outreach (Signed)
[facility]) Care Management  05/02/2019  Cathy Gonzalez 11-17-1945 [id number]   Received provider portion(s) of patient assistance application for [place]. Faxed completed application and required documents into [employer].  Will follow up with company in 3-5 business days to check status of application.  [name] [place] Certified Pharmacy Technician [place] Management Direct [phone]

## 2019-05-06 ENCOUNTER — Other Ambulatory Visit: Payer: Self-pay | Admitting: Pharmacy Technician

## 2019-05-06 NOTE — Patient Outreach (Signed)
[place] [facility]) Care Management  05/06/2019  Cathy Gonzalez 21-Sep-1946 [id number]    Follow up call placed to [employer] regarding patient assistance application(s) for [name] and [name] states that updated proof of income needs to be sent in. The proof of income letter that was submitted was from 2019 and they are requiring the 2020 to be submitted.   Successful call placed to patient regarding patient assistance update for [name] and [name], HIPAA identifiers verified. Informed Cathy Gonzalez of above details, she states that she does have a current copy of letter.  Will mail out return envelope so that she can mail copy of letter back into [facility] office  Follow up:  Will follow up with patient in 7-10 business days to confirm envelope have been received.  [name] [place] Certified Pharmacy Technician [place] Management Direct [phone]

## 2019-05-09 ENCOUNTER — Ambulatory Visit: Payer: Self-pay

## 2019-05-12 ENCOUNTER — Telehealth: Payer: Self-pay | Admitting: Podiatry

## 2019-05-12 NOTE — Telephone Encounter (Signed)
Pt called checking on diabetic shoes and I told pt I would check into it and call her back.  I talked with [employer] and we got approval and the shoes and inserts will be here in a couple of weeks and I will call when they come in.

## 2019-05-16 ENCOUNTER — Ambulatory Visit: Payer: PPO | Admitting: Family Medicine

## 2019-05-16 ENCOUNTER — Encounter: Payer: Self-pay | Admitting: Family Medicine

## 2019-05-16 ENCOUNTER — Other Ambulatory Visit: Payer: Self-pay

## 2019-05-16 DIAGNOSIS — Z9989 Dependence on other enabling machines and devices: Secondary | ICD-10-CM

## 2019-05-16 DIAGNOSIS — G4733 Obstructive sleep apnea (adult) (pediatric): Secondary | ICD-10-CM | POA: Diagnosis not present

## 2019-05-16 NOTE — Progress Notes (Signed)
PATIENT: Cathy Gonzalez DOB: 1946-07-01  REASON FOR VISIT: follow up HISTORY FROM: patient  Chief Complaint  Patient presents with  . Follow-up    room 9, Initial CPAP/OSA. alone.     HISTORY OF PRESENT ILLNESS: Today 05/16/19 Cathy Gonzalez is a 73 y.o. female here today for follow up of OSA on CPAP. She has done well with therapy.  She does note that she is having some difficulty getting her nasal pillow and the correct positioning at night.  She feels that this is improving with usage.  She has noted that she is sleeping better using CPAP therapy.  Compliance report dated 04/12/2019 through 05/11/2019 reveals that she is using CPAP 30 out of the last 30 days for compliance of 100%.  28 of those days she used CPAP for greater than 4 hours for compliance of 93%.  AHI was 2.1 on 5 to 16 cm of water.  EPR of 3.  There was a mild leak noted in the 95th percentile at 20.1.  HISTORY: (copied from Dr Dohmeier's note on 02/07/2019)  HPI:  Cathy Gonzalez is a 73 y.o. female patient  wth known sleep apnea , but she couldn't use CPAP in the past.  He son has noted her snoring much louder than he remembered, and has recently spent time at her place.  Her original sleep study took place about 2012-13, ordered by Dr Criss Rosales. She had been diagnosed and ordered to use a CPAP from [facility], but "choked on the water ". She has had light sleep related seizures, the last in December 2019.  I could not find out remember to use CPAP the last time, but it seems that even when she used CPAP she will use it daily but only 2 or 3 times a week.  I made her aware that with that low compliance she would not be able to get the machine's cost covered, nor the supplies- equipment.  Chief complaint according to patient : "My son told me my snoring got worse- he visited from [place] In March -wants me to look into CPAP again".   Sleep and medical history: 'Nocturnal epilepsy" - treated by Dr Jannifer Franklin for seizures that only occur  at  night- in sleep. She is  on Keppra and Lyrica. Morbid obesity- Gout, hyperlipidemia and DM - Insulin user, also on Metformin. DM related progressed Neuropathy and Osteoarthritis. Takes multiple antihypertensives and lasix.   Family sleep history: Mother with DM and heart disease. On insulin. She passed in 19-Nov-2017.  Father died when she was 50, brother has DM, sister has DM, and breast cancer,   Social history: no coffee, no iced tea, no sodas.  She worked night shifts, late shifts - now is on day shift. Part time,  4 adult children, 3 sons and one daughter are all healthy.   Sleep habits are as follows: Cathy. Safran reports that her breakfast time is usually around 5:30 AM, her lunchtime at 1 and her dinnertime at 5PM or as late as 6 PM.  She goes to bed early at 9 PM and she describes her bedroom as dark but she states is not cool she usually keeps it over 70 degrees. Her bedroom is not quiet but did not explicitly tell me what she meant by this.   She sleeps mostly on her side, sometimes she sleeps on 2 ,sometimes on 3 pillows.  She goes to the bathroom at least twice at night and states that she  takes diuretics.   Her first bathroom break is between midnight and 1 AM, her second between 3 and 4 AM , and she states that she does dream but not very often. She rises at 4.30 AM to go to work. She works part time.    REVIEW OF SYSTEMS: Out of a complete 14 system review of symptoms, the patient complains only of the following symptoms, dizziness, numbness, tremors and all other reviewed systems are negative.  Fatigue severity scale: 50 Epworth sleepiness scale: 8  ALLERGIES: No Known Allergies  HOME MEDICATIONS: Outpatient Medications Prior to Visit  Medication Sig Dispense Refill  . acetaminophen (TYLENOL) 500 MG tablet Take 500 mg by mouth every 6 (six) hours as needed for moderate pain.    [name] amLODipine (NORVASC) 10 MG tablet Take 10 mg by mouth daily. Takes half    . Blood Glucose  Monitoring Suppl (ONE TOUCH ULTRA 2) w/Device KIT 2 (two) times daily. as directed    . chlorthalidone (HYGROTON) 25 MG tablet Take 25 mg by mouth daily.     . colchicine 0.6 MG tablet Take 0.6 mg by mouth as needed (gout).     [name] diclofenac sodium (VOLTAREN) 1 % GEL Apply 1 application topically 3 (three) times daily as needed (feet pain).    [name] ezetimibe (ZETIA) 10 MG tablet Take 10 mg by mouth at bedtime.     [name] FIASP FLEXTOUCH 100 UNIT/ML SOPN Inject 4-15 Units into the skin 3 (three) times daily with meals.   2  . furosemide (LASIX) 40 MG tablet Take 40 mg by mouth daily.     . insulin degludec (TRESIBA) 100 UNIT/ML SOPN FlexTouch Pen Inject 10 Units into the skin every evening.     . levETIRAcetam (KEPPRA) 750 MG tablet Take 2 tablets (1,500 mg total) by mouth 2 (two) times daily. 360 tablet 3  . metFORMIN (GLUCOPHAGE) 500 MG tablet Take 500 mg by mouth at bedtime.     . montelukast (SINGULAIR) 10 MG tablet Take 10 mg by mouth at bedtime.    . NOREL AD 4-10-325 MG TABS TAKE 1 TABLET BY MOUTH 4 TIMES DAILY    . ONE TOUCH ULTRA TEST test strip   5  . ONETOUCH DELICA LANCETS 40J MISC   3  . Potassium Chloride ER 20 MEQ TBCR Take 1 tablet by mouth 2 (two) times daily.  6  . pregabalin (LYRICA) 300 MG capsule Take 1 capsule (300 mg total) by mouth 2 (two) times daily. 60 capsule 0  . sucralfate (CARAFATE) 1 G tablet Take 1 g by mouth 2 (two) times daily.     [name] telmisartan (MICARDIS) 80 MG tablet Take 80 mg by mouth daily.   1  . aspirin EC 81 MG tablet Take 1 tablet (81 mg total) by mouth daily. (Patient taking differently: Take 81 mg by mouth daily as needed (pt preferred). ) 30 tablet 3  . cephALEXin (KEFLEX) 250 MG capsule Take 250 mg by mouth 4 (four) times daily.     No facility-administered medications prior to visit.     PAST MEDICAL HISTORY: Past Medical History:  Diagnosis Date  . Arthritis   . Cataract    left cataract removed in 2017  . Diabetes mellitus   . Diabetic  neuropathy ([place])   . Diabetic neuropathy, type II diabetes mellitus ([place])   . Hepatitis C    took tx for   . Hypertension   . Hypokalemia   . Knee pain  right   . Morbidly obese ([place])   . Nocturnal seizures ([place]) 05/14/2015   no bad seizure since 1980's  . OSA on CPAP    uses cpap 2-3 week  . Plantar fasciitis    both feet    PAST SURGICAL HISTORY: Past Surgical History:  Procedure Laterality Date  . ABDOMINAL HYSTERECTOMY     partial  . CATARACT EXTRACTION Left   . COLONOSCOPY WITH PROPOFOL N/A 08/21/2015   Procedure: COLONOSCOPY WITH PROPOFOL;  Surgeon: Carol Ada, MD;  Location: [facility] END[facility];  Service: Endoscopy;  Laterality: N/A;  . ESOPHAGOGASTRODUODENOSCOPY (EGD) WITH PROPOFOL N/A 08/21/2015   Procedure: ESOPHAGOGASTRODUODENOSCOPY (EGD) WITH PROPOFOL;  Surgeon: Carol Ada, MD;  Location: [facility];  Service: Endoscopy;  Laterality: N/A;  . ESOPHAGOGASTRODUODENOSCOPY (EGD) WITH PROPOFOL N/A 10/08/2018   Procedure: ESOPHAGOGASTRODUODENOSCOPY (EGD) WITH PROPOFOL;  Surgeon: Carol Ada, MD;  Location: [facility];  Service: Endoscopy;  Laterality: N/A;  . EYE SURGERY Bilateral    cataracts    FAMILY HISTORY: Family History  Problem Relation Age of Onset  . Diabetes Mother   . Heart attack Mother   . Diabetes Sister   . Diabetes Brother   . Cancer Sister        breast  . Breast cancer Sister   . Seizures Neg Hx     SOCIAL HISTORY: Social History   Socioeconomic History  . Marital status: Single    Spouse name: Not on file  . Number of children: 4  . Years of education: 50  . Highest education level: Not on file  Occupational History  . Occupation: [employer]  Social Needs  . Financial resource strain: Not on file  . Food insecurity    Worry: Not on file    Inability: Not on file  . Transportation needs    Medical: Not on file    Non-medical: Not on file  Tobacco Use  . Smoking status: Never Smoker  . Smokeless tobacco: Never Used  Substance  and Sexual Activity  . Alcohol use: No    Alcohol/week: 0.0 standard drinks  . Drug use: No  . Sexual activity: Never  Lifestyle  . Physical activity    Days per week: Not on file    Minutes per session: Not on file  . Stress: Not on file  Relationships  . Social Herbalist on phone: Not on file    Gets together: Not on file    Attends religious service: Not on file    Active member of club or organization: Not on file    Attends meetings of clubs or organizations: Not on file    Relationship status: Not on file  . Intimate partner violence    Fear of current or ex partner: Not on file    Emotionally abused: Not on file    Physically abused: Not on file    Forced sexual activity: Not on file  Other Topics Concern  . Not on file  Social History Narrative   Patient occasionally drinks caffeine.   Patient is right handed.      PHYSICAL EXAM  Vitals:   05/16/19 0818  BP: 130/88  Pulse: 85  Temp: (!) 96.9 F (36.1 C)  Weight: 234 lb 3.2 oz (106.2 kg)  Height: _0  (1.626 m)   Body mass index is 40.2 kg/m.  Generalized: Well developed, in no acute distress  Cardiology: normal rate and rhythm, no murmur noted Respiratory: Clear to auscultation bilaterally Mallampati 3+,  neck circ 17.5" Neurological examination  Mentation: Alert oriented to time, place, history taking. Follows all commands speech and language fluent Cranial nerve II-XII: Pupils were equal round reactive to light. Extraocular movements were full, visual field were full on confrontational test. Facial sensation and strength were normal. Uvula tongue midline. Head turning and shoulder shrug  were normal and symmetric. Motor: The motor testing reveals 5 over 5 strength of all 4 extremities. Good symmetric motor tone is noted throughout.  Coordination: Cerebellar testing reveals good finger-nose-finger and heel-to-shin bilaterally.  Gait and station: Gait is normal.   DIAGNOSTIC DATA (LABS,  IMAGING, TESTING) - I reviewed patient records, labs, notes, testing and imaging myself where available.  No flowsheet data found.   Lab Results  Component Value Date   WBC 7.3 02/16/2017   HGB 13.9 02/16/2017   HCT 41.7 02/16/2017   MCV 88.7 02/16/2017   PLT 230 02/16/2017      Component Value Date/Time   NA 137 02/16/2017 1325   NA 140 08/11/2016 0833   K 3.4 (L) 02/16/2017 1325   CL 98 (L) 02/16/2017 1325   CO2 26 02/16/2017 1325   GLUCOSE 230 (H) 02/16/2017 1325   BUN 24 (H) 02/16/2017 1325   BUN 23 08/11/2016 0833   CREATININE 1.21 (H) 02/16/2017 1325   CREATININE 1.08 (H) 10/20/2016 1549   CALCIUM 10.2 02/16/2017 1325   PROT 7.5 10/20/2016 1549   PROT 7.2 08/11/2016 0833   ALBUMIN 4.6 10/20/2016 1549   ALBUMIN 4.5 08/11/2016 0833   AST 24 10/20/2016 1549   ALT 32 (H) 10/20/2016 1549   ALKPHOS 45 10/20/2016 1549   BILITOT 0.3 10/20/2016 1549   BILITOT <0.2 08/11/2016 0833   GFRNONAA 45 (L) 02/16/2017 1325   GFRNONAA 53 (L) 10/20/2016 1549   GFRAA 52 (L) 02/16/2017 1325   GFRAA 61 10/20/2016 1549   Lab Results  Component Value Date   CHOL 216 (H) 02/11/2014   HDL 65 02/11/2014   LDLCALC 133 (H) 02/11/2014   TRIG 92 02/11/2014   CHOLHDL 3.3 02/11/2014   Lab Results  Component Value Date   HGBA1C 7.2 (H) 02/11/2014   No results found for: VITAMINB12 No results found for: TSH    ASSESSMENT AND PLAN 73 y.o. year old female  has a past medical history of Arthritis, Cataract, Diabetes mellitus, Diabetic neuropathy ([place]), Diabetic neuropathy, type II diabetes mellitus ([place]), Hepatitis C, Hypertension, Hypokalemia, Knee pain, Morbidly obese ([place]), Nocturnal seizures ([place]) (05/14/2015), OSA on CPAP, and Plantar fasciitis. here with     ICD-10-CM   1. OSA on CPAP  G47.33    Z99.89     Cathy Gonzalez is doing well on CPAP therapy.  Compliance report reveals excellent compliance.  We have discussed mild leak.  She will continue to work with mask for appropriate fit.   She was advised to call me should any concerns of leak continue.  We will consider mask refitting if needed.  She was encouraged to continue using CPAP therapy nightly and for greater than 4 hours each night.  We will follow-up in 1 year, sooner if needed.  She verbalizes understanding and agreement with this plan.   No orders of the defined types were placed in this encounter.    No orders of the defined types were placed in this encounter.     I spent 15 minutes with the patient. 50% of this time was spent counseling and educating patient on plan of care and medications.  Debbora Presto, FNP-C 05/16/2019, 8:59 AM [facility] [address], [place], [place] [address] [phone]

## 2019-05-16 NOTE — Patient Instructions (Signed)
Continue CPAP nightly and for greater than hours each night  Make sure your mask is tight at night, call for any concerns  Follow up in 1 year, sooner if needed    CPAP and BPAP Information CPAP and BPAP are methods of helping a person breathe with the use of air pressure. CPAP stands for "continuous positive airway pressure." BPAP stands for "bi-level positive airway pressure." In both methods, air is blown through your nose or mouth and into your air passages to help you breathe well. CPAP and BPAP use different amounts of pressure to blow air. With CPAP, the amount of pressure stays the same while you breathe in and out. With BPAP, the amount of pressure is increased when you breathe in (inhale) so that you can take larger breaths. Your health care provider will recommend whether CPAP or BPAP would be more helpful for you. Why are CPAP and BPAP treatments used? CPAP or BPAP can be helpful if you have:  Sleep apnea.  Chronic obstructive pulmonary disease (COPD).  Heart failure.  Medical conditions that weaken the muscles of the chest including muscular dystrophy, or neurological diseases such as amyotrophic lateral sclerosis (ALS).  Other problems that cause breathing to be weak, abnormal, or difficult. CPAP is most commonly used for obstructive sleep apnea (OSA) to keep the airways from collapsing when the muscles relax during sleep. How is CPAP or BPAP administered? Both CPAP and BPAP are provided by a small machine with a flexible plastic tube that attaches to a plastic mask. You wear the mask. Air is blown through the mask into your nose or mouth. The amount of pressure that is used to blow the air can be adjusted on the machine. Your health care provider will determine the pressure setting that should be used based on your individual needs. When should CPAP or BPAP be used? In most cases, the mask only needs to be worn during sleep. Generally, the mask needs to be worn throughout the  night and during any daytime naps. People with certain medical conditions may also need to wear the mask at other times when they are awake. Follow instructions from your health care provider about when to use the machine. What are some tips for using the mask?   Because the mask needs to be snug, some people feel trapped or closed-in (claustrophobic) when first using the mask. If you feel this way, you may need to get used to the mask. One way to do this is by holding the mask loosely over your nose or mouth and then gradually applying the mask more snugly. You can also gradually increase the amount of time that you use the mask.  Masks are available in various types and sizes. Some fit over your mouth and nose while others fit over just your nose. If your mask does not fit well, talk with your health care provider about getting a different one.  If you are using a mask that fits over your nose and you tend to breathe through your mouth, a chin strap may be applied to help keep your mouth closed.  The CPAP and BPAP machines have alarms that may sound if the mask comes off or develops a leak.  If you have trouble with the mask, it is very important that you talk with your health care provider about finding a way to make the mask easier to tolerate. Do not stop using the mask. Stopping the use of the mask could have a  negative impact on your health. What are some tips for using the machine?  Place your CPAP or BPAP machine on a secure table or stand near an electrical outlet.  Know where the on/off switch is located on the machine.  Follow instructions from your health care provider about how to set the pressure on your machine and when you should use it.  Do not eat or drink while the CPAP or BPAP machine is on. Food or fluids could get pushed into your lungs by the pressure of the CPAP or BPAP.  Do not smoke. Tobacco smoke residue can damage the machine.  For home use, CPAP and BPAP  machines can be rented or purchased through home health care companies. Many different brands of machines are available. Renting a machine before purchasing may help you find out which particular machine works well for you.  Keep the CPAP or BPAP machine and attachments clean. Ask your health care provider for specific instructions. Get help right away if:  You have redness or open areas around your nose or mouth where the mask fits.  You have trouble using the CPAP or BPAP machine.  You cannot tolerate wearing the CPAP or BPAP mask.  You have pain, discomfort, and bloating in your abdomen. Summary  CPAP and BPAP are methods of helping a person breathe with the use of air pressure.  Both CPAP and BPAP are provided by a small machine with a flexible plastic tube that attaches to a plastic mask.  If you have trouble with the mask, it is very important that you talk with your health care provider about finding a way to make the mask easier to tolerate. This information is not intended to replace advice given to you by your health care provider. Make sure you discuss any questions you have with your health care provider. Document Released: 06/20/2004 Document Revised: 01/12/2019 Document Reviewed: 08/11/2016 Elsevier Patient Education  [place]. Sleep Apnea Sleep apnea affects breathing during sleep. It causes breathing to stop for a short time or to become shallow. It can also increase the risk of:  Heart attack.  Stroke.  Being very overweight (obese).  Diabetes.  Heart failure.  Irregular heartbeat. The goal of treatment is to help you breathe normally again. What are the causes? There are three kinds of sleep apnea:  Obstructive sleep apnea. This is caused by a blocked or collapsed airway.  Central sleep apnea. This happens when the brain does not send the right signals to the muscles that control breathing.  Mixed sleep apnea. This is a combination of  obstructive and central sleep apnea. The most common cause of this condition is a collapsed or blocked airway. This can happen if:  Your throat muscles are too relaxed.  Your tongue and tonsils are too large.  You are overweight.  Your airway is too small. What increases the risk?  Being overweight.  Smoking.  Having a small airway.  Being older.  Being female.  Drinking alcohol.  Taking medicines to calm yourself (sedatives or tranquilizers).  Having family members with the condition. What are the signs or symptoms?  Trouble staying asleep.  Being sleepy or tired during the day.  Getting angry a lot.  Loud snoring.  Headaches in the morning.  Not being able to focus your mind (concentrate).  Forgetting things.  Less interest in sex.  Mood swings.  Personality changes.  Feelings of sadness (depression).  Waking up a lot during the night to  pee (urinate).  Dry mouth.  Sore throat. How is this diagnosed?  Your medical history.  A physical exam.  A test that is done when you are sleeping (sleep study). The test is most often done in a sleep lab but may also be done at home. How is this treated?   Sleeping on your side.  Using a medicine to get rid of mucus in your nose (decongestant).  Avoiding the use of alcohol, medicines to help you relax, or certain pain medicines (narcotics).  Losing weight, if needed.  Changing your diet.  Not smoking.  Using a machine to open your airway while you sleep, such as: ? An oral appliance. This is a mouthpiece that shifts your lower jaw forward. ? A CPAP device. This device blows air through a mask when you breathe out (exhale). ? An EPAP device. This has valves that you put in each nostril. ? A BPAP device. This device blows air through a mask when you breathe in (inhale) and breathe out.  Having surgery if other treatments do not work. It is important to get treatment for sleep apnea. Without  treatment, it can lead to:  High blood pressure.  Coronary artery disease.  In men, not being able to have an erection (impotence).  Reduced thinking ability. Follow these instructions at home: Lifestyle  Make changes that your doctor recommends.  Eat a healthy diet.  Lose weight if needed.  Avoid alcohol, medicines to help you relax, and some pain medicines.  Do not use any products that contain nicotine or tobacco, such as cigarettes, e-cigarettes, and chewing tobacco. If you need help quitting, ask your doctor. General instructions  Take over-the-counter and prescription medicines only as told by your doctor.  If you were given a machine to use while you sleep, use it only as told by your doctor.  If you are having surgery, make sure to tell your doctor you have sleep apnea. You may need to bring your device with you.  Keep all follow-up visits as told by your doctor. This is important. Contact a doctor if:  The machine that you were given to use during sleep bothers you or does not seem to be working.  You do not get better.  You get worse. Get help right away if:  Your chest hurts.  You have trouble breathing in enough air.  You have an uncomfortable feeling in your back, arms, or stomach.  You have trouble talking.  One side of your body feels weak.  A part of your face is hanging down. These symptoms may be an emergency. Do not wait to see if the symptoms will go away. Get medical help right away. Call your local emergency services (911 in the [place] Do not drive yourself to the hospital. Summary  This condition affects breathing during sleep.  The most common cause is a collapsed or blocked airway.  The goal of treatment is to help you breathe normally while you sleep. This information is not intended to replace advice given to you by your health care provider. Make sure you discuss any questions you have with your health care provider. Document  Released: 07/01/2008 Document Revised: 07/09/2018 Document Reviewed: 05/18/2018 Elsevier Patient Education  2020 [employer].

## 2019-05-20 ENCOUNTER — Other Ambulatory Visit: Payer: Self-pay | Admitting: Pharmacy Technician

## 2019-05-20 ENCOUNTER — Encounter: Payer: Self-pay | Admitting: Pharmacy Technician

## 2019-05-20 NOTE — Patient Outreach (Signed)
[facility]) Care Management  05/20/2019  Cathy Gonzalez 1946-07-06 [id number]   Received patients updated proof of income documents. Faxed document into [employer].  Will follow up with company in 3-5 business days to check status of application for [place].  Mailed patients original document back to her.  [name] [place] Certified Pharmacy Technician [place] Management Direct Dial:[phone]

## 2019-05-24 ENCOUNTER — Telehealth: Payer: Self-pay | Admitting: Podiatry

## 2019-05-24 NOTE — Telephone Encounter (Signed)
Pt left message checking on status of diabetic shoes.   I returned call and we finally got the paperwork back and now the shoes are on back order until 9.30.2020. Pt wants to wait and if they come in prior to that I would call pt to get her in to pick them up sooner.

## 2019-05-25 ENCOUNTER — Other Ambulatory Visit: Payer: Self-pay | Admitting: Pharmacy Technician

## 2019-05-25 NOTE — Patient Outreach (Signed)
[facility]) Care Management  05/25/2019  Cathy Gonzalez 03/31/1946 [id number]    Follow up call placed to [employer] regarding patient assistance application(s) for [name] and [name] confirms patient has been approved as of 8/17 until 10/06/19. Medication to arrive at providers office in 10-14 business days.  Follow up:  Will follow up with patient in 10-14 business days to confirm medication has been received.  [name] [place] Certified Pharmacy Technician [place] Management Direct Dial:[phone]

## 2019-05-29 DIAGNOSIS — G4733 Obstructive sleep apnea (adult) (pediatric): Secondary | ICD-10-CM | POA: Diagnosis not present

## 2019-05-30 ENCOUNTER — Telehealth: Payer: Self-pay | Admitting: Podiatry

## 2019-05-30 ENCOUNTER — Telehealth: Payer: Self-pay | Admitting: Orthopaedic Surgery

## 2019-05-30 NOTE — Telephone Encounter (Signed)
Please advise... You saw this patient last in March for chronic right knee pain.

## 2019-05-30 NOTE — Telephone Encounter (Signed)
Patient called requesting an updated handicap placard form. Please give patient a call when ready for pick up.

## 2019-05-30 NOTE — Telephone Encounter (Signed)
Patient calling requesting a handicap placard to be filled out for her. Due to her right knee, and having to sit on a stool at work patient is unable to walk a long distance. Please call when ready to pick up.

## 2019-05-31 NOTE — Telephone Encounter (Signed)
Called patient. Left message on machine that she needs to call and make an appointment to be seen first.

## 2019-05-31 NOTE — Telephone Encounter (Signed)
Needs to make an appt-has been 6 months since she was seen

## 2019-06-01 ENCOUNTER — Telehealth: Payer: Self-pay | Admitting: Podiatry

## 2019-06-01 DIAGNOSIS — E0841 Diabetes mellitus due to underlying condition with diabetic mononeuropathy: Secondary | ICD-10-CM | POA: Diagnosis not present

## 2019-06-01 DIAGNOSIS — I739 Peripheral vascular disease, unspecified: Secondary | ICD-10-CM | POA: Diagnosis not present

## 2019-06-01 DIAGNOSIS — E1169 Type 2 diabetes mellitus with other specified complication: Secondary | ICD-10-CM | POA: Diagnosis not present

## 2019-06-01 DIAGNOSIS — I10 Essential (primary) hypertension: Secondary | ICD-10-CM | POA: Diagnosis not present

## 2019-06-01 NOTE — Telephone Encounter (Signed)
lvm for pt that diabetic shoes are in and I have scheduled her to pick them up on 9.14.2020 on same day as appt with Dr Elisha Ponder but if she wants to get them sooner we can schedule it on 9.4.

## 2019-06-02 ENCOUNTER — Telehealth: Payer: Self-pay | Admitting: *Deleted

## 2019-06-02 NOTE — Telephone Encounter (Signed)
Dr. Elisha Ponder stated she would order a temporary 3 month handicap sticker and if pt needed one for her knee or longer duration she would need to contact her PCP.

## 2019-06-07 ENCOUNTER — Other Ambulatory Visit: Payer: Self-pay

## 2019-06-07 ENCOUNTER — Ambulatory Visit (INDEPENDENT_AMBULATORY_CARE_PROVIDER_SITE_OTHER): Payer: PPO | Admitting: Orthopaedic Surgery

## 2019-06-07 ENCOUNTER — Encounter: Payer: Self-pay | Admitting: Orthopaedic Surgery

## 2019-06-07 VITALS — BP 123/82 | HR 69 | Ht 64.0 in | Wt 229.0 lb

## 2019-06-07 DIAGNOSIS — M545 Low back pain, unspecified: Secondary | ICD-10-CM

## 2019-06-07 DIAGNOSIS — Z6839 Body mass index (BMI) 39.0-39.9, adult: Secondary | ICD-10-CM | POA: Diagnosis not present

## 2019-06-07 DIAGNOSIS — E1142 Type 2 diabetes mellitus with diabetic polyneuropathy: Secondary | ICD-10-CM

## 2019-06-07 DIAGNOSIS — G8929 Other chronic pain: Secondary | ICD-10-CM | POA: Diagnosis not present

## 2019-06-07 NOTE — Addendum Note (Signed)
Addended by: Lendon Collar on: 06/07/2019 04:37 PM   Modules accepted: Orders

## 2019-06-07 NOTE — Progress Notes (Signed)
Office Visit Note   Patient: Cathy Gonzalez           Date of Birth: 1946-04-26           MRN: [id number] Visit Date: 06/07/2019              Requested by: [name], [address] [place],  [place] [address] PCP: [name], MD   Assessment & Plan: Visit Diagnoses:  1. Diabetic polyneuropathy associated with type 2 diabetes mellitus (HCC)   2. Class 2 severe obesity due to excess calories with serious comorbidity and body mass index (BMI) of 39.0 to 39.9 in adult [facility])     Plan: Multiple joint complaints predominately recurrent back pain and burning of both feet.  Is type II diabetic.  Has been diagnosed with neuropathy per her primary care physician and has been on gabapentin and now Lyrica.  Has difficulty walking because of the burning in her feet.  Also has some numbness and tingling in both lower extremities associated with back pain.. Had an MRI scan of the lumbar spine in 2018 with advanced multilevel spondylosis most pronounced from L2-L5 with potentially symptomatic neural impingement at L2-3, L3-4, L4-5 and L5-S1.  Had an epidural steroid injection.  Will also obtain vascular consult. Follow-up with primary care physician regarding her neuropathy and treatment.  Office visit over 30 minutes 50% of the time in counseling  Follow-Up Instructions: Return in about 1 month (around 07/07/2019).   Orders:  No orders of the defined types were placed in this encounter.  No orders of the defined types were placed in this encounter.     Procedures: No procedures performed   Clinical Data: No additional findings.   Subjective: Chief Complaint  Patient presents with  . Right Knee - Follow-up  Patient presents today for a 6 month follow up on her right knee. She received a cortisone injection on 12/09/2018. Patient states that her knee is doing very well. Patient states that her concern today is both her feet. Her feet have been hurting for about 3 weeks. She said that  both feet are burning all over, and constantly. She said that walking makes the pain worse. She has a cut under her little right toe that she noticed last week.  Has history of type 2 diabetes with obesity.  She is recently lost 30 pounds.  Primary care physician has diagnosed neuropathy and has been on Lyrica with some relief.  The burning in her feet really interferes with her ability to ambulate.  Also has had some recurrent low back pain.  Had an MRI scan of the lumbar spine in 2018 demonstrating multiple areas of spondylosis and potential foraminal stenosis had an epidural steroid injection with good relief.  Recent hemoglobin A1c was 6.3  HPI  Review of Systems  Constitutional: Negative for fatigue.  HENT: Negative for ear pain.   Eyes: Negative for pain.  Respiratory: Positive for shortness of breath.   Cardiovascular: Positive for leg swelling.  Gastrointestinal: Positive for constipation and diarrhea.  Endocrine: Negative for cold intolerance and heat intolerance.  Genitourinary: Negative for difficulty urinating.  Musculoskeletal: Negative for joint swelling.  Skin: Negative for rash.  Allergic/Immunologic: Negative for food allergies.  Neurological: Negative for weakness.  Hematological: Does not bruise/bleed easily.  Psychiatric/Behavioral: Negative for sleep disturbance.     Objective: Vital Signs: BP 123/82   Pulse 69   Ht 5\' 4"  (1.626 m)   Wt 229 lb (  103.9 kg)   BMI 39.31 kg/m   Physical Exam Constitutional:      Appearance: She is well-developed.  Eyes:     Pupils: Pupils are equal, round, and reactive to light.  Pulmonary:     Effort: Pulmonary effort is normal.  Skin:    General: Skin is warm and dry.  Neurological:     Mental Status: She is alert and oriented to person, place, and time.  Psychiatric:        Behavior: Behavior normal.     Ortho Exam awake alert and oriented x3.  Comfortable sitting with negative straight leg raise.  Has significant  moisture between the fourth and fifth toes of her right foot and have suggested powder.  Does have burning in the plantar aspect of her foot.  Bilateral pes planus.  No heel or ankle pain.  Left foot was cooler than right.  I did not feel pulses in either feet.  Good capillary refill.  No calf pain.  Specialty Comments:  No specialty comments available.  Imaging: No results found.   PMFS History: Patient Active Problem List   Diagnosis Date Noted  . OSA on CPAP 05/16/2019  . Diabetic neuropathy associated with type 2 diabetes mellitus ([place]) 12/13/2018  . Chronic pain of right knee 12/09/2018  . Thumb pain, left 12/09/2018  . Dizziness and giddiness 01/14/2016  . Hepatic cirrhosis ([place]) 07/26/2015  . Nocturnal seizures ([place]) 05/14/2015  . Chronic hepatitis C without hepatic coma ([place]) 03/14/2015  . Vertigo, central 02/11/2014  . Difficulty walking 02/11/2014  . Diabetes mellitus ([place]) 02/11/2014  . Dysphagia 02/11/2014  . OBESITY 07/26/2010  . Essential hypertension, benign 07/26/2010  . DEGENERATIVE JOINT DISEASE, GENERALIZED 07/26/2010   Past Medical History:  Diagnosis Date  . Arthritis   . Cataract    left cataract removed in 2017  . Diabetes mellitus   . Diabetic neuropathy ([place])   . Diabetic neuropathy, type II diabetes mellitus ([place])   . Hepatitis C    took tx for   . Hypertension   . Hypokalemia   . Knee pain    right   . Morbidly obese ([place])   . Nocturnal seizures ([place]) 05/14/2015   no bad seizure since 1980's  . OSA on CPAP    uses cpap 2-3 week  . Plantar fasciitis    both feet    Family History  Problem Relation Age of Onset  . Diabetes Mother   . Heart attack Mother   . Diabetes Sister   . Diabetes Brother   . Cancer Sister        breast  . Breast cancer Sister   . Seizures Neg Hx     Past Surgical History:  Procedure Laterality Date  . ABDOMINAL HYSTERECTOMY     partial  . CATARACT EXTRACTION Left   . COLONOSCOPY WITH PROPOFOL N/A  08/21/2015   Procedure: COLONOSCOPY WITH PROPOFOL;  Surgeon: Carol Ada, MD;  Location: [facility] ENDOSCOPY;  Service: Endoscopy;  Laterality: N/A;  . ESOPHAGOGASTRODUODENOSCOPY (EGD) WITH PROPOFOL N/A 08/21/2015   Procedure: ESOPHAGOGASTRODUODENOSCOPY (EGD) WITH PROPOFOL;  Surgeon: Carol Ada, MD;  Location: [facility] ENDOSCOPY;  Service: Endoscopy;  Laterality: N/A;  . ESOPHAGOGASTRODUODENOSCOPY (EGD) WITH PROPOFOL N/A 10/08/2018   Procedure: ESOPHAGOGASTRODUODENOSCOPY (EGD) WITH PROPOFOL;  Surgeon: Carol Ada, MD;  Location: [facility] ENDOSCOPY;  Service: Endoscopy;  Laterality: N/A;  . EYE SURGERY Bilateral    cataracts   Social History   Occupational History  . Occupation: Dentist  Tobacco  Use  . Smoking status: Never Smoker  . Smokeless tobacco: Never Used  Substance and Sexual Activity  . Alcohol use: No    Alcohol/week: 0.0 standard drinks  . Drug use: No  . Sexual activity: Never

## 2019-06-10 ENCOUNTER — Other Ambulatory Visit: Payer: Self-pay | Admitting: Pharmacy Technician

## 2019-06-10 NOTE — Patient Outreach (Addendum)
[facility]) Care Management  06/10/2019  Cathy Gonzalez 1946-09-18 [id number]    Unsuccessful call placed to patient regarding patient assistance medication delivery of Fiasp and Tresiba, HIPAA compliant voicemail left.   Follow up:  Will make additional call attempt in 5-7 business days if call has not been returned.  ADDENDUM 2:45pm  Return call from [name], HIPAA identifiers verified. [name] confirms she picked up [place] from Dr. Perley Jain office. She states that she only received 3 pens of [place] and thought that it usually comes 5 pens in a box.   Will  Make outreach call to [employer] in 2-3 business days to verify how many pens of each medication patient should have received.  [name] [place] Certified Pharmacy Technician [place] Management Direct Dial:[phone]

## 2019-06-15 ENCOUNTER — Other Ambulatory Visit: Payer: Self-pay | Admitting: Pharmacy Technician

## 2019-06-15 NOTE — Patient Outreach (Signed)
[name]) Care Management  06/15/2019  Cathy Gonzalez November 14, 1945 [id number]   Outreach call made to [employer] to verify how many pens of Tresiba and [place] were shipped to providers office. Geanie Berlin confirms that 1 box = 5 pens of Tresiba and 3 boxes=15 pens of Fiasp were shipped for patient.  Successful call made to Cathy Gonzalez, HIPAA identifiers verified. Informed Cathy Gonzalez of above details and she states that she only received 3 pens of Tresiba from providers office. Informed her that she would need to contact Dr. Perley Jain office and inform them because they would have to contact [employer] and request more for her. She confirms that she would contact office.  Will route note to [place] for case closure  Maud Deed. Chana Bode [place] Certified Pharmacy Technician [place] Management Direct Dial:[phone]

## 2019-06-16 ENCOUNTER — Other Ambulatory Visit: Payer: Self-pay | Admitting: Pharmacist

## 2019-06-16 NOTE — Patient Outreach (Signed)
[facility]) Care Management [place]  06/16/2019  Cathy Gonzalez 09-01-1946 [id number]  Patient has been approved for [place] patient assistance program(s).  Patient has been instructed on how to order refills and renewal process for 2020.    Plan: [place] case is being closed due to the following reasons: -Goals of care have been met. -I have provided my contact information if patient or family needs to reach out to me in the future.  -Thank you for allowing [facility] pharmacy to be involved in this patient's care.    Ralene Bathe, PharmD, [place] [phone]

## 2019-06-20 ENCOUNTER — Other Ambulatory Visit: Payer: Self-pay

## 2019-06-20 ENCOUNTER — Encounter: Payer: Self-pay | Admitting: Podiatry

## 2019-06-20 ENCOUNTER — Ambulatory Visit (INDEPENDENT_AMBULATORY_CARE_PROVIDER_SITE_OTHER): Payer: PPO | Admitting: Orthotics

## 2019-06-20 ENCOUNTER — Ambulatory Visit (INDEPENDENT_AMBULATORY_CARE_PROVIDER_SITE_OTHER): Payer: PPO | Admitting: Podiatry

## 2019-06-20 DIAGNOSIS — M722 Plantar fascial fibromatosis: Secondary | ICD-10-CM | POA: Diagnosis not present

## 2019-06-20 DIAGNOSIS — M79674 Pain in right toe(s): Secondary | ICD-10-CM | POA: Diagnosis not present

## 2019-06-20 DIAGNOSIS — E1149 Type 2 diabetes mellitus with other diabetic neurological complication: Secondary | ICD-10-CM

## 2019-06-20 DIAGNOSIS — B351 Tinea unguium: Secondary | ICD-10-CM | POA: Diagnosis not present

## 2019-06-20 DIAGNOSIS — M79675 Pain in left toe(s): Secondary | ICD-10-CM | POA: Diagnosis not present

## 2019-06-20 DIAGNOSIS — E1140 Type 2 diabetes mellitus with diabetic neuropathy, unspecified: Secondary | ICD-10-CM

## 2019-06-20 DIAGNOSIS — E1142 Type 2 diabetes mellitus with diabetic polyneuropathy: Secondary | ICD-10-CM

## 2019-06-20 DIAGNOSIS — L84 Corns and callosities: Secondary | ICD-10-CM | POA: Diagnosis not present

## 2019-06-20 NOTE — Progress Notes (Signed)
Subjective: Cathy Gonzalez is seen today for diabetic foot care follow up painful, elongated, thickened toenails b/l feet that she cannot cut. Pain interferes with daily activities. Aggravating factor includes wearing enclosed shoe gear and relieved with periodic debridement.  Objective:  Neurovascular status unchanged: CFT immediate x 10 digiits.   DP/PT pulses palpable b/l.  Digital hair absent b/l.   Skin temperature gradient WNL b/l.   No pain with calf compression b/l.  Sensation diminished with 10 gram monofilament b/l.  Vibratory sensation absent b/l.  Dermatological Examination: Skin with normal turgor, texture and tone b/l.  Toenails 3-5 right, 2-5 left b/l discolored, thick, dystrophic with subungual debris and pain with palpation to nailbeds due to thickness of nails.  Anonychia b/l great toes and right 2nd digit with evidence of permanent total nail avulsion. Nailbed(s) completely epithelialized and intact.  Hyperkeratotic lesion submet head 5 left foot and lateral 5th digit nailplates b/l with tenderness to palpation. No edema, no erythema, no drainage, no flocculence.  Musculoskeletal: Muscle strength 5/5 to all LE muscle groups.  HAV with bunion b/l; hammertoes b/l.  No pain, crepitus or joint limitation noted with ROM.   Assessment: Painful onychomycosis toenails 3-5 right, 2-5 left b/l  Callus submet head 5 left foot Lister's corns b/l 5th digits NIDDM with neuropathy  Plan: 1. Toenails 3-5 right, 2-5 left were debrided in length and girth without iatrogenic bleeding. 2. Patient to continue soft, supportive shoe gear 3. Patient to report any pedal injuries to medical professional immediately. 4. Follow up 3 months.  5. Patient/POA to call should there be a concern in the interim.

## 2019-06-20 NOTE — Progress Notes (Signed)

## 2019-06-20 NOTE — Patient Instructions (Signed)
Diabetes Mellitus and Foot Care Foot care is an important part of your health, especially when you have diabetes. Diabetes may cause you to have problems because of poor blood flow (circulation) to your feet and legs, which can cause your skin to:  Become thinner and drier.  Break more easily.  Heal more slowly.  Peel and crack. You may also have nerve damage (neuropathy) in your legs and feet, causing decreased feeling in them. This means that you may not notice minor injuries to your feet that could lead to more serious problems. Noticing and addressing any potential problems early is the best way to prevent future foot problems. How to care for your feet Foot hygiene  Wash your feet daily with warm water and mild soap. Do not use hot water. Then, pat your feet and the areas between your toes until they are completely dry. Do not soak your feet as this can dry your skin.  Trim your toenails straight across. Do not dig under them or around the cuticle. File the edges of your nails with an emery board or nail file.  Apply a moisturizing lotion or petroleum jelly to the skin on your feet and to dry, brittle toenails. Use lotion that does not contain alcohol and is unscented. Do not apply lotion between your toes. Shoes and socks  Wear clean socks or stockings every day. Make sure they are not too tight. Do not wear knee-high stockings since they may decrease blood flow to your legs.  Wear shoes that fit properly and have enough cushioning. Always look in your shoes before you put them on to be sure there are no objects inside.  To break in new shoes, wear them for just a few hours a day. This prevents injuries on your feet. Wounds, scrapes, corns, and calluses  Check your feet daily for blisters, cuts, bruises, sores, and redness. If you cannot see the bottom of your feet, use a mirror or ask someone for help.  Do not cut corns or calluses or try to remove them with medicine.  If you  find a minor scrape, cut, or break in the skin on your feet, keep it and the skin around it clean and dry. You may clean these areas with mild soap and water. Do not clean the area with peroxide, alcohol, or iodine.  If you have a wound, scrape, corn, or callus on your foot, look at it several times a day to make sure it is healing and not infected. Check for: ? Redness, swelling, or pain. ? Fluid or blood. ? Warmth. ? Pus or a bad smell. General instructions  Do not cross your legs. This may decrease blood flow to your feet.  Do not use heating pads or hot water bottles on your feet. They may burn your skin. If you have lost feeling in your feet or legs, you may not know this is happening until it is too late.  Protect your feet from hot and cold by wearing shoes, such as at the beach or on hot pavement.  Schedule a complete foot exam at least once a year (annually) or more often if you have foot problems. If you have foot problems, report any cuts, sores, or bruises to your health care provider immediately. Contact a health care provider if:  You have a medical condition that increases your risk of infection and you have any cuts, sores, or bruises on your feet.  You have an injury that is not   healing.  You have redness on your legs or feet.  You feel burning or tingling in your legs or feet.  You have pain or cramps in your legs and feet.  Your legs or feet are numb.  Your feet always feel cold.  You have pain around a toenail. Get help right away if:  You have a wound, scrape, corn, or callus on your foot and: ? You have pain, swelling, or redness that gets worse. ? You have fluid or blood coming from the wound, scrape, corn, or callus. ? Your wound, scrape, corn, or callus feels warm to the touch. ? You have pus or a bad smell coming from the wound, scrape, corn, or callus. ? You have a fever. ? You have a red line going up your leg. Summary  Check your feet every day  for cuts, sores, red spots, swelling, and blisters.  Moisturize feet and legs daily.  Wear shoes that fit properly and have enough cushioning.  If you have foot problems, report any cuts, sores, or bruises to your health care provider immediately.  Schedule a complete foot exam at least once a year (annually) or more often if you have foot problems. This information is not intended to replace advice given to you by your health care provider. Make sure you discuss any questions you have with your health care provider. Document Released: 09/19/2000 Document Revised: 11/04/2017 Document Reviewed: 10/24/2016 Elsevier Patient Education  2020 Elsevier Inc.  Corns and Calluses Corns are small areas of thickened skin that occur on the top, sides, or tip of a toe. They contain a cone-shaped core with a point that can press on a nerve below. This causes pain.  Calluses are areas of thickened skin that can occur anywhere on the body, including the hands, fingers, palms, soles of the feet, and heels. Calluses are usually larger than corns. What are the causes? Corns and calluses are caused by rubbing (friction) or pressure, such as from shoes that are too tight or do not fit properly. What increases the risk? Corns are more likely to develop in people who have misshapen toes (toe deformities), such as hammer toes. Calluses can occur with friction to any area of the skin. They are more likely to develop in people who:  Work with their hands.  Wear shoes that fit poorly, are too tight, or are high-heeled.  Have toe deformities. What are the signs or symptoms? Symptoms of a corn or callus include:  A hard growth on the skin.  Pain or tenderness under the skin.  Redness and swelling.  Increased discomfort while wearing tight-fitting shoes, if your feet are affected. If a corn or callus becomes infected, symptoms may include:  Redness and swelling that gets worse.  Pain.  Fluid, blood, or  pus draining from the corn or callus. How is this diagnosed? Corns and calluses may be diagnosed based on your symptoms, your medical history, and a physical exam. How is this treated? Treatment for corns and calluses may include:  Removing the cause of the friction or pressure. This may involve: ? Changing your shoes. ? Wearing shoe inserts (orthotics) or other protective layers in your shoes, such as a corn pad. ? Wearing gloves.  Applying medicine to the skin (topical medicine) to help soften skin in the hardened, thickened areas.  Removing layers of dead skin with a file to reduce the size of the corn or callus.  Removing the corn or callus with a scalpel or   laser.  Taking antibiotic medicines, if your corn or callus is infected.  Having surgery, if a toe deformity is the cause. Follow these instructions at home:   Take over-the-counter and prescription medicines only as told by your health care provider.  If you were prescribed an antibiotic, take it as told by your health care provider. Do not stop taking it even if your condition starts to improve.  Wear shoes that fit well. Avoid wearing high-heeled shoes and shoes that are too tight or too loose.  Wear any padding, protective layers, gloves, or orthotics as told by your health care provider.  Soak your hands or feet and then use a file or pumice stone to soften your corn or callus. Do this as told by your health care provider.  Check your corn or callus every day for symptoms of infection. Contact a health care provider if you:  Notice that your symptoms do not improve with treatment.  Have redness or swelling that gets worse.  Notice that your corn or callus becomes painful.  Have fluid, blood, or pus coming from your corn or callus.  Have new symptoms. Summary  Corns are small areas of thickened skin that occur on the top, sides, or tip of a toe.  Calluses are areas of thickened skin that can occur anywhere  on the body, including the hands, fingers, palms, and soles of the feet. Calluses are usually larger than corns.  Corns and calluses are caused by rubbing (friction) or pressure, such as from shoes that are too tight or do not fit properly.  Treatment may include wearing any padding, protective layers, gloves, or orthotics as told by your health care provider. This information is not intended to replace advice given to you by your health care provider. Make sure you discuss any questions you have with your health care provider. Document Released: 06/28/2004 Document Revised: 01/12/2019 Document Reviewed: 08/05/2017 Elsevier Patient Education  2020 Elsevier Inc.  Onychomycosis/Fungal Toenails  WHAT IS IT? An infection that lies within the keratin of your nail plate that is caused by a fungus.  WHY ME? Fungal infections affect all ages, sexes, races, and creeds.  There may be many factors that predispose you to a fungal infection such as age, coexisting medical conditions such as diabetes, or an autoimmune disease; stress, medications, fatigue, genetics, etc.  Bottom line: fungus thrives in a warm, moist environment and your shoes offer such a location.  IS IT CONTAGIOUS? Theoretically, yes.  You do not want to share shoes, nail clippers or files with someone who has fungal toenails.  Walking around barefoot in the same room or sleeping in the same bed is unlikely to transfer the organism.  It is important to realize, however, that fungus can spread easily from one nail to the next on the same foot.  HOW DO WE TREAT THIS?  There are several ways to treat this condition.  Treatment may depend on many factors such as age, medications, pregnancy, liver and kidney conditions, etc.  It is best to ask your doctor which options are available to you.  1. No treatment.   Unlike many other medical concerns, you can live with this condition.  However for many people this can be a painful condition and may  lead to ingrown toenails or a bacterial infection.  It is recommended that you keep the nails cut short to help reduce the amount of fungal nail. 2. Topical treatment.  These range from herbal remedies to prescription   strength nail lacquers.  About 40-50% effective, topicals require twice daily application for approximately 9 to 12 months or until an entirely new nail has grown out.  The most effective topicals are medical grade medications available through physicians offices. 3. Oral antifungal medications.  With an 80-90% cure rate, the most common oral medication requires 3 to 4 months of therapy and stays in your system for a year as the new nail grows out.  Oral antifungal medications do require blood work to make sure it is a safe drug for you.  A liver function panel will be performed prior to starting the medication and after the first month of treatment.  It is important to have the blood work performed to avoid any harmful side effects.  In general, this medication safe but blood work is required. 4. Laser Therapy.  This treatment is performed by applying a specialized laser to the affected nail plate.  This therapy is noninvasive, fast, and non-painful.  It is not covered by insurance and is therefore, out of pocket.  The results have been very good with a 80-95% cure rate.  [facility] is the only practice in the area to offer this therapy. 5. Permanent Nail Avulsion.  Removing the entire nail so that a new nail will not grow back. 

## 2019-06-27 ENCOUNTER — Encounter: Payer: PPO | Admitting: Physical Medicine and Rehabilitation

## 2019-06-29 DIAGNOSIS — G4733 Obstructive sleep apnea (adult) (pediatric): Secondary | ICD-10-CM | POA: Diagnosis not present

## 2019-07-06 DIAGNOSIS — E1169 Type 2 diabetes mellitus with other specified complication: Secondary | ICD-10-CM | POA: Diagnosis not present

## 2019-07-06 DIAGNOSIS — I739 Peripheral vascular disease, unspecified: Secondary | ICD-10-CM | POA: Diagnosis not present

## 2019-07-06 DIAGNOSIS — I9589 Other hypotension: Secondary | ICD-10-CM | POA: Diagnosis not present

## 2019-07-06 DIAGNOSIS — M1000 Idiopathic gout, unspecified site: Secondary | ICD-10-CM | POA: Diagnosis not present

## 2019-07-07 ENCOUNTER — Other Ambulatory Visit: Payer: Self-pay

## 2019-07-07 ENCOUNTER — Ambulatory Visit: Payer: PPO | Admitting: Orthopaedic Surgery

## 2019-07-07 DIAGNOSIS — R208 Other disturbances of skin sensation: Secondary | ICD-10-CM

## 2019-07-11 ENCOUNTER — Encounter: Payer: PPO | Admitting: Physical Medicine and Rehabilitation

## 2019-07-12 ENCOUNTER — Ambulatory Visit (HOSPITAL_COMMUNITY)
Admission: RE | Admit: 2019-07-12 | Discharge: 2019-07-12 | Disposition: A | Discharge status: Home / Self Care | Payer: PPO | Source: Ambulatory Visit | Attending: Family | Admitting: Family

## 2019-07-12 ENCOUNTER — Ambulatory Visit (INDEPENDENT_AMBULATORY_CARE_PROVIDER_SITE_OTHER): Payer: PPO | Admitting: Vascular Surgery

## 2019-07-12 ENCOUNTER — Other Ambulatory Visit: Payer: Self-pay

## 2019-07-12 ENCOUNTER — Encounter: Payer: Self-pay | Admitting: Vascular Surgery

## 2019-07-12 VITALS — BP 118/77 | HR 73 | Temp 97.9°F | Resp 20 | Ht 64.0 in | Wt 232.1 lb

## 2019-07-12 DIAGNOSIS — R208 Other disturbances of skin sensation: Secondary | ICD-10-CM | POA: Diagnosis not present

## 2019-07-12 NOTE — Progress Notes (Signed)
Vascular and Vein Specialist of [facility]  Patient name: Cathy Gonzalez MRN: [id number] DOB: January 05, 1946 Sex: female  REASON FOR CONSULT: Evaluation of burning and stinging sensation in both lower extremities to rule out arterial insufficiency  HPI: Cathy Gonzalez is a 73 y.o. female, who is here today for evaluation.  She reports a constant burning and stinging and tenderness in both lower extremities.  This is mainly from her knees distally.  She does specifically have some knee difficulty and has had some response to cortisone injections in her knee.  Also has known spinal stenosis.  She is morbidly obese.  Does have long history of diabetes.  She does not have any ti history of tissue loss.  She has remote history of toenail removal for chronic infection.  Past Medical History:  Diagnosis Date  . Arthritis   . Cataract    left cataract removed in 2017  . Diabetes mellitus   . Diabetic neuropathy ([place])   . Diabetic neuropathy, type II diabetes mellitus ([place])   . Hepatitis C    took tx for   . Hypertension   . Hypokalemia   . Knee pain    right   . Morbidly obese ([place])   . Nocturnal seizures ([place]) 05/14/2015   no bad seizure since 1980's  . OSA on CPAP    uses cpap 2-3 week  . Plantar fasciitis    both feet    Family History  Problem Relation Age of Onset  . Diabetes Mother   . Heart attack Mother   . Diabetes Sister   . Diabetes Brother   . Cancer Sister        breast  . Breast cancer Sister   . Seizures Neg Hx     SOCIAL HISTORY: Social History   Socioeconomic History  . Marital status: Single    Spouse name: Not on file  . Number of children: 4  . Years of education: 44  . Highest education level: Not on file  Occupational History  . Occupation: [employer]  Social Needs  . Financial resource strain: Not on file  . Food insecurity    Worry: Not on file    Inability: Not on file  . Transportation needs    Medical: Not on  file    Non-medical: Not on file  Tobacco Use  . Smoking status: Never Smoker  . Smokeless tobacco: Never Used  Substance and Sexual Activity  . Alcohol use: No    Alcohol/week: 0.0 standard drinks  . Drug use: No  . Sexual activity: Never  Lifestyle  . Physical activity    Days per week: Not on file    Minutes per session: Not on file  . Stress: Not on file  Relationships  . Social Herbalist on phone: Not on file    Gets together: Not on file    Attends religious service: Not on file    Active member of club or organization: Not on file    Attends meetings of clubs or organizations: Not on file    Relationship status: Not on file  . Intimate partner violence    Fear of current or ex partner: Not on file    Emotionally abused: Not on file    Physically abused: Not on file    Forced sexual activity: Not on file  Other Topics Concern  . Not on file  Social History Narrative   Patient occasionally drinks caffeine.  Patient is right handed.    No Known Allergies  Current Outpatient Medications  Medication Sig Dispense Refill  . acetaminophen (TYLENOL) 500 MG tablet Take 500 mg by mouth every 6 (six) hours as needed for moderate pain.    [name] amLODipine (NORVASC) 10 MG tablet Take 10 mg by mouth daily. Takes half    . Blood Glucose Monitoring Suppl (ONE TOUCH ULTRA 2) w/Device KIT 2 (two) times daily. as directed    . colchicine 0.6 MG tablet Take 0.6 mg by mouth as needed (gout).     [name] diclofenac sodium (VOLTAREN) 1 % GEL Apply 1 application topically 3 (three) times daily as needed (feet pain).    [name] ezetimibe (ZETIA) 10 MG tablet Take 10 mg by mouth at bedtime.     [name] FIASP FLEXTOUCH 100 UNIT/ML SOPN Inject 4-15 Units into the skin 3 (three) times daily with meals.   2  . furosemide (LASIX) 40 MG tablet Take 40 mg by mouth daily.     . insulin degludec (TRESIBA) 100 UNIT/ML SOPN FlexTouch Pen Inject 10 Units into the skin every evening.     . levETIRAcetam  (KEPPRA) 750 MG tablet Take 2 tablets (1,500 mg total) by mouth 2 (two) times daily. 360 tablet 3  . metFORMIN (GLUCOPHAGE) 500 MG tablet Take 500 mg by mouth at bedtime.     . montelukast (SINGULAIR) 10 MG tablet Take 10 mg by mouth at bedtime.    . NOREL AD 4-10-325 MG TABS TAKE 1 TABLET BY MOUTH 4 TIMES DAILY    . ONE TOUCH ULTRA TEST test strip   5  . ONETOUCH DELICA LANCETS 70H MISC   3  . Potassium Chloride ER 20 MEQ TBCR Take 1 tablet by mouth 2 (two) times daily.  6  . pregabalin (LYRICA) 300 MG capsule Take 1 capsule (300 mg total) by mouth 2 (two) times daily. 60 capsule 0  . sucralfate (CARAFATE) 1 G tablet Take 1 g by mouth 2 (two) times daily.     [name] telmisartan (MICARDIS) 80 MG tablet Take 80 mg by mouth daily.   1   No current facility-administered medications for this visit.     REVIEW OF SYSTEMS:  _0  denotes positive finding, _1  denotes negative finding Cardiac  Comments:  Chest pain or chest pressure:    Shortness of breath upon exertion:    Short of breath when lying flat:    Irregular heart rhythm:        Vascular    Pain in calf, thigh, or hip brought on by ambulation:    Pain in feet at night that wakes you up from your sleep:  x   Blood clot in your veins:    Leg swelling:  x       Pulmonary    Oxygen at home:    Productive cough:     Wheezing:         Neurologic    Sudden weakness in arms or legs:  x   Sudden numbness in arms or legs:  x   Sudden onset of difficulty speaking or slurred speech:    Temporary loss of vision in one eye:     Problems with dizziness:         Gastrointestinal    Blood in stool:     Vomited blood:         Genitourinary    Burning when urinating:     Blood in urine:  Psychiatric    Major depression:         Hematologic    Bleeding problems:    Problems with blood clotting too easily:        Skin    Rashes or ulcers: x       Constitutional    Fever or chills:      PHYSICAL EXAM: Vitals:   07/12/19  1525  BP: 118/77  Pulse: 73  Resp: 20  Temp: 97.9 F (36.6 C)  SpO2: 99%  Weight: 232 lb 1.6 oz (105.3 kg)  Height: _0  (1.626 m)    GENERAL: The patient is a well-nourished female, in no acute distress. The vital signs are documented above. CARDIOVASCULAR: 2+ radial and 2+ dorsalis pedis pulses bilaterally PULMONARY: There is good air exchange  ABDOMEN: Soft and non-tender  MUSCULOSKELETAL: There are no major deformities or cyanosis. NEUROLOGIC: No focal weakness or paresthesias are detected. SKIN: There are no ulcers or rashes noted. PSYCHIATRIC: The patient has a normal affect.  DATA:  Noninvasive lower extremity studies reveal normal ankle arm index and triphasic waveforms in her anterior tibial and posterior tibial arteries bilaterally  MEDICAL ISSUES: Discussed these findings at length with the patient.  Explained that she does not have any evidence of arterial insufficiency as the cause of her symptoms.  This does appear to be mainly related to peripheral neuropathy.  She is on appropriate medication for this.  She was reassured with this discussion that she does not have any risk for tissue loss or limb loss.  She will see [place] again on an as-needed basis   Rosetta Posner, MD [facility] Vascular and Vein Specialists of [facility] Tel [phone] Pager [phone]

## 2019-07-14 ENCOUNTER — Ambulatory Visit: Payer: PPO | Admitting: Orthopaedic Surgery

## 2019-07-14 ENCOUNTER — Other Ambulatory Visit: Payer: Self-pay

## 2019-07-14 ENCOUNTER — Encounter: Payer: Self-pay | Admitting: Orthopaedic Surgery

## 2019-07-14 DIAGNOSIS — G8929 Other chronic pain: Secondary | ICD-10-CM

## 2019-07-14 DIAGNOSIS — M5442 Lumbago with sciatica, left side: Secondary | ICD-10-CM | POA: Diagnosis not present

## 2019-07-14 DIAGNOSIS — M5441 Lumbago with sciatica, right side: Secondary | ICD-10-CM | POA: Diagnosis not present

## 2019-07-14 DIAGNOSIS — M545 Low back pain, unspecified: Secondary | ICD-10-CM | POA: Insufficient documentation

## 2019-07-14 NOTE — Progress Notes (Signed)
Office Visit Note   Patient: Cathy Gonzalez           Date of Birth: 12-Apr-1946           MRN: [id number] Visit Date: 07/14/2019              Requested by: [name], [address] [place],  [place] [address] PCP: [name], MD   Assessment & Plan: Visit Diagnoses:  1. Chronic bilateral low back pain with bilateral sciatica     Plan: Cathy Gonzalez was evaluated by Dr. Donnetta Hutching.  There was no evidence of any vascular compromise to either lower extremity.  All of her lower extremity symptoms appear to be related to her diabetic neuropathy.  She is being followed by her primary care physician and is on Lyrica.  She also has chronic back pain with a prior MRI scan performed in 2018 revealing degenerative changes and neural impingement.  She is scheduled to see Dr. Ernestina Patches for an epidural steroid injection in the very near future.  We have reviewed her evaluations over the past month and she did not have any further questions.  She has lost 45 pounds with improvement in her blood sugar and blood pressure.  We will continue with Voltaren gel for the arthritis in her hands as well as the colchicine with her history of gout per her primary care physician  Follow-Up Instructions: Return if symptoms worsen or fail to improve.   Orders:  No orders of the defined types were placed in this encounter.  No orders of the defined types were placed in this encounter.     Procedures: No procedures performed   Clinical Data: No additional findings.   Subjective: Chief Complaint  Patient presents with  . Right Foot - Follow-up  . Left Foot - Follow-up  Patient presents today for a one month follow up on her peripheral neuropathy. Patient states that her feet still burn, but on and off. She takes Lyrica and Colcrys. She uses Diclofenac gel on her feet as well.  I had referred Cathy Gonzalez to Dr. Donnetta Hutching to evaluate possible vascular compromise to her lower extremities.  The studies were essentially  normal.  She continues to have diabetic neuropathy and being followed by her primary care physician.  She also has a history of gout and is on chronic colchicine.  She has multiple joint complaints that appear to be relatively stable.  She  continues to be active with her employment  HPI  Review of Systems  Constitutional: Negative for fatigue.  HENT: Negative for ear pain.   Respiratory: Negative for shortness of breath.   Cardiovascular: Negative for leg swelling.  Gastrointestinal: Negative for constipation and diarrhea.  Endocrine: Negative for cold intolerance and heat intolerance.  Genitourinary: Negative for difficulty urinating.  Musculoskeletal: Positive for joint swelling.  Skin: Positive for rash.  Allergic/Immunologic: Negative for food allergies.  Neurological: Negative for weakness.  Hematological: Does not bruise/bleed easily.  Psychiatric/Behavioral: Negative for sleep disturbance.     Objective: Vital Signs: BP 128/78   Pulse 73   Ht 5\' 4"  (1.626 m)   Wt 232 lb (105.2 kg)   BMI 39.82 kg/m   Physical Exam Constitutional:      Appearance: She is well-developed.  Eyes:     Pupils: Pupils are equal, round, and reactive to light.  Pulmonary:     Effort: Pulmonary effort is normal.  Skin:    General: Skin is warm and dry.  Neurological:     Mental Status: She is alert and oriented to person, place, and time.  Psychiatric:        Behavior: Behavior normal.     Ortho Exam awake alert and oriented x3.  Comfortable sitting.  Has Heberden's nodes across both hands more so at the DIP joint of the right index finger.  She may have a little gout in the same joint.  This been no change in a least a year.  She can make a full fist.  She does have some altered sensibility to the tips of her fingers which could be a combination of diabetic neuropathy and carpal tunnel.  For the moment she is comfortable  Straight leg raise was negative.  Walks without ambulatory aid her  left  Specialty Comments:  No specialty comments available.  Imaging: No results found.   PMFS History: Patient Active Problem List   Diagnosis Date Noted  . Low back pain 07/14/2019  . OSA on CPAP 05/16/2019  . Diabetic neuropathy associated with type 2 diabetes mellitus ([place]) 12/13/2018  . Chronic pain of right knee 12/09/2018  . Thumb pain, left 12/09/2018  . Dizziness and giddiness 01/14/2016  . Hepatic cirrhosis ([place]) 07/26/2015  . Nocturnal seizures ([place]) 05/14/2015  . Chronic hepatitis C without hepatic coma ([place]) 03/14/2015  . Vertigo, central 02/11/2014  . Difficulty walking 02/11/2014  . Diabetes mellitus ([place]) 02/11/2014  . Dysphagia 02/11/2014  . OBESITY 07/26/2010  . Essential hypertension, benign 07/26/2010  . DEGENERATIVE JOINT DISEASE, GENERALIZED 07/26/2010   Past Medical History:  Diagnosis Date  . Arthritis   . Cataract    left cataract removed in 2017  . Diabetes mellitus   . Diabetic neuropathy ([place])   . Diabetic neuropathy, type II diabetes mellitus ([place])   . Hepatitis C    took tx for   . Hypertension   . Hypokalemia   . Knee pain    right   . Morbidly obese ([place])   . Nocturnal seizures ([place]) 05/14/2015   no bad seizure since 1980's  . OSA on CPAP    uses cpap 2-3 week  . Plantar fasciitis    both feet    Family History  Problem Relation Age of Onset  . Diabetes Mother   . Heart attack Mother   . Diabetes Sister   . Diabetes Brother   . Cancer Sister        breast  . Breast cancer Sister   . Seizures Neg Hx     Past Surgical History:  Procedure Laterality Date  . ABDOMINAL HYSTERECTOMY     partial  . CATARACT EXTRACTION Left   . COLONOSCOPY WITH PROPOFOL N/A 08/21/2015   Procedure: COLONOSCOPY WITH PROPOFOL;  Surgeon: Carol Ada, MD;  Location: [facility] END[facility]COPY;  Service: Endoscopy;  Laterality: N/A;  . ESOPHAGOGASTRODUODENOSCOPY (EGD) WITH PROPOFOL N/A 08/21/2015   Procedure: ESOPHAGOGASTRODUODENOSCOPY (EGD) WITH PROPOFOL;   Surgeon: Carol Ada, MD;  Location: [facility];  Service: Endoscopy;  Laterality: N/A;  . ESOPHAGOGASTRODUODENOSCOPY (EGD) WITH PROPOFOL N/A 10/08/2018   Procedure: ESOPHAGOGASTRODUODENOSCOPY (EGD) WITH PROPOFOL;  Surgeon: Carol Ada, MD;  Location: [facility] END[facility];  Service: Endoscopy;  Laterality: N/A;  . EYE SURGERY Bilateral    cataracts   Social History   Occupational History  . Occupation: [employer]  Tobacco Use  . Smoking status: Never Smoker  . Smokeless tobacco: Never Used  Substance and Sexual Activity  . Alcohol use: No    Alcohol/week: 0.0 standard drinks  .  Drug use: No  . Sexual activity: Never

## 2019-07-21 DIAGNOSIS — G4733 Obstructive sleep apnea (adult) (pediatric): Secondary | ICD-10-CM | POA: Diagnosis not present

## 2019-07-26 DIAGNOSIS — E1169 Type 2 diabetes mellitus with other specified complication: Secondary | ICD-10-CM | POA: Diagnosis not present

## 2019-07-26 DIAGNOSIS — G4730 Sleep apnea, unspecified: Secondary | ICD-10-CM | POA: Diagnosis not present

## 2019-07-26 DIAGNOSIS — I10 Essential (primary) hypertension: Secondary | ICD-10-CM | POA: Diagnosis not present

## 2019-07-26 DIAGNOSIS — M1000 Idiopathic gout, unspecified site: Secondary | ICD-10-CM | POA: Diagnosis not present

## 2019-07-29 DIAGNOSIS — G4733 Obstructive sleep apnea (adult) (pediatric): Secondary | ICD-10-CM | POA: Diagnosis not present

## 2019-08-01 DIAGNOSIS — Z0000 Encounter for general adult medical examination without abnormal findings: Secondary | ICD-10-CM | POA: Diagnosis not present

## 2019-08-01 DIAGNOSIS — E1169 Type 2 diabetes mellitus with other specified complication: Secondary | ICD-10-CM | POA: Diagnosis not present

## 2019-08-01 DIAGNOSIS — M130 Polyarthritis, unspecified: Secondary | ICD-10-CM | POA: Diagnosis not present

## 2019-08-01 DIAGNOSIS — I10 Essential (primary) hypertension: Secondary | ICD-10-CM | POA: Diagnosis not present

## 2019-08-01 DIAGNOSIS — G629 Polyneuropathy, unspecified: Secondary | ICD-10-CM | POA: Diagnosis not present

## 2019-08-01 DIAGNOSIS — M1000 Idiopathic gout, unspecified site: Secondary | ICD-10-CM | POA: Diagnosis not present

## 2019-08-03 ENCOUNTER — Encounter: Payer: PPO | Admitting: Physical Medicine and Rehabilitation

## 2019-08-22 ENCOUNTER — Ambulatory Visit (INDEPENDENT_AMBULATORY_CARE_PROVIDER_SITE_OTHER): Payer: PPO | Admitting: Physical Medicine and Rehabilitation

## 2019-08-22 ENCOUNTER — Ambulatory Visit: Payer: Self-pay

## 2019-08-22 ENCOUNTER — Other Ambulatory Visit: Payer: Self-pay

## 2019-08-22 ENCOUNTER — Encounter: Payer: Self-pay | Admitting: Physical Medicine and Rehabilitation

## 2019-08-22 VITALS — BP 154/98 | HR 89

## 2019-08-22 DIAGNOSIS — M48062 Spinal stenosis, lumbar region with neurogenic claudication: Secondary | ICD-10-CM

## 2019-08-22 DIAGNOSIS — M5416 Radiculopathy, lumbar region: Secondary | ICD-10-CM

## 2019-08-22 MED ORDER — METHYLPREDNISOLONE ACETATE 80 MG/ML IJ SUSP
80.0000 mg | Freq: Once | INTRAMUSCULAR | Status: AC
  Administered 2019-08-22: 80 mg

## 2019-08-22 NOTE — Progress Notes (Signed)
 .  Numeric Pain Rating Scale and Functional Assessment Average Pain 8   In the last MONTH (on 0-10 scale) has pain interfered with the following?  1. General activity like being  able to carry out your everyday physical activities such as walking, climbing stairs, carrying groceries, or moving a chair?  Rating(8)   +Driver, -BT, -Dye Allergies.  

## 2019-08-29 DIAGNOSIS — G4733 Obstructive sleep apnea (adult) (pediatric): Secondary | ICD-10-CM | POA: Diagnosis not present

## 2019-09-19 ENCOUNTER — Encounter: Payer: Self-pay | Admitting: Podiatry

## 2019-09-19 ENCOUNTER — Ambulatory Visit (INDEPENDENT_AMBULATORY_CARE_PROVIDER_SITE_OTHER): Payer: PPO | Admitting: Podiatry

## 2019-09-19 ENCOUNTER — Other Ambulatory Visit: Payer: Self-pay

## 2019-09-19 DIAGNOSIS — E1142 Type 2 diabetes mellitus with diabetic polyneuropathy: Secondary | ICD-10-CM

## 2019-09-19 DIAGNOSIS — L84 Corns and callosities: Secondary | ICD-10-CM

## 2019-09-19 DIAGNOSIS — B351 Tinea unguium: Secondary | ICD-10-CM | POA: Diagnosis not present

## 2019-09-19 DIAGNOSIS — E1169 Type 2 diabetes mellitus with other specified complication: Secondary | ICD-10-CM | POA: Diagnosis not present

## 2019-09-19 DIAGNOSIS — M79674 Pain in right toe(s): Secondary | ICD-10-CM

## 2019-09-19 DIAGNOSIS — M79675 Pain in left toe(s): Secondary | ICD-10-CM | POA: Diagnosis not present

## 2019-09-19 DIAGNOSIS — N189 Chronic kidney disease, unspecified: Secondary | ICD-10-CM | POA: Diagnosis not present

## 2019-09-19 DIAGNOSIS — J399 Disease of upper respiratory tract, unspecified: Secondary | ICD-10-CM | POA: Diagnosis not present

## 2019-09-19 DIAGNOSIS — F4320 Adjustment disorder, unspecified: Secondary | ICD-10-CM | POA: Diagnosis not present

## 2019-09-19 DIAGNOSIS — Z20828 Contact with and (suspected) exposure to other viral communicable diseases: Secondary | ICD-10-CM | POA: Diagnosis not present

## 2019-09-19 DIAGNOSIS — W19XXXA Unspecified fall, initial encounter: Secondary | ICD-10-CM | POA: Diagnosis not present

## 2019-09-19 NOTE — Progress Notes (Signed)
Subjective: Cathy Gonzalez is a 73 y.o. y.o. female who presents today with h/o diabetic neuropathy for callus and  painful, discolored, thick toenails and painful callus/corn which interfere with daily activities. Pain is aggravated when wearing enclosed shoe gear and relieved with periodic professional debridement.  [name], MD is her PCP.   Medications reviewed in chart.  No Known Allergies  Objective: There were no vitals filed for this visit.  Vascular Examination: Capillary refill time immediate to digits b/l.  Dorsalis pedis pulses palpable b/l.  Posterior tibial pulses palpable b/l.  Digital hair absent b/l.  Skin temperature gradient WNL b/l.  Dermatological Examination: Skin with normal turgor, texture and tone b/l.  Hyperkeratotic lesions lateral 5th digit nailplates b/l and submet head 5 left foot with tenderness to palpation. No edema, no erythema, no drainage, no flocculence.  Toenails 3-5 right, 2-5 left discolored, thick, dystrophic with subungual debris and pain with palpation to nailbeds due to thickness of nails.  Musculoskeletal: Muscle strength 5/5 to all LE muscle groups b/l.  Neurological: Sensation diminished with 10 gram monofilament b/l.  Assessment: 1. Painful onychomycosis toenails 3-5 right, 2-5 left 2.  Callus submet head 5 left foot 3.  Listers corns b/l 5th digits. 4.  NIDDM with neuropathy  Plan: 1. Continue diabetic foot care principles. Literature dispensed on today. 2. Toenails 3-5 right, 2-5 left were debrided in length and girth without iatrogenic bleeding. 3. Calluses pared submetatarsal head 5 left foot utilizing sterile scalpel blade without incident.  4. Corns b/l 5th digits pared utilizing sterile scalpel blade without incident.   5. Patient to continue soft, supportive shoe gear daily. 6. Patient to report any pedal injuries to medical professional immediately. 7. Follow up 3 months.  8. Patient/POA to call should there  be a concern in the interim.

## 2019-09-19 NOTE — Patient Instructions (Signed)

## 2019-09-28 DIAGNOSIS — G4733 Obstructive sleep apnea (adult) (pediatric): Secondary | ICD-10-CM | POA: Diagnosis not present

## 2019-10-15 ENCOUNTER — Other Ambulatory Visit: Payer: Self-pay | Admitting: Neurology

## 2019-10-21 ENCOUNTER — Other Ambulatory Visit: Payer: Self-pay | Admitting: Neurology

## 2019-10-24 ENCOUNTER — Other Ambulatory Visit: Payer: Self-pay | Admitting: Neurology

## 2019-10-26 ENCOUNTER — Other Ambulatory Visit: Payer: Self-pay | Admitting: Neurology

## 2019-10-29 DIAGNOSIS — G4733 Obstructive sleep apnea (adult) (pediatric): Secondary | ICD-10-CM | POA: Diagnosis not present

## 2019-10-31 DIAGNOSIS — I10 Essential (primary) hypertension: Secondary | ICD-10-CM | POA: Diagnosis not present

## 2019-10-31 DIAGNOSIS — M130 Polyarthritis, unspecified: Secondary | ICD-10-CM | POA: Diagnosis not present

## 2019-10-31 DIAGNOSIS — F064 Anxiety disorder due to known physiological condition: Secondary | ICD-10-CM | POA: Diagnosis not present

## 2019-10-31 DIAGNOSIS — E785 Hyperlipidemia, unspecified: Secondary | ICD-10-CM | POA: Diagnosis not present

## 2019-11-18 ENCOUNTER — Other Ambulatory Visit: Payer: Self-pay | Admitting: Neurology

## 2019-11-21 ENCOUNTER — Other Ambulatory Visit: Payer: Self-pay

## 2019-11-21 ENCOUNTER — Telehealth: Payer: Self-pay | Admitting: Neurology

## 2019-11-21 MED ORDER — LEVETIRACETAM 750 MG PO TABS: 1500.0000 mg | ORAL_TABLET | Freq: Two times a day (BID) | ORAL | 3 refills | Status: DC

## 2019-11-21 NOTE — Telephone Encounter (Signed)
Pt is needing a refill on her levETIRAcetam (KEPPRA) 750 MG tablet sent in to the [facility] on [address].

## 2019-11-21 NOTE — Telephone Encounter (Signed)
Refill sent to [facility] on [address].[name]

## 2019-11-29 DIAGNOSIS — G4733 Obstructive sleep apnea (adult) (pediatric): Secondary | ICD-10-CM | POA: Diagnosis not present

## 2019-12-05 DIAGNOSIS — R197 Diarrhea, unspecified: Secondary | ICD-10-CM | POA: Diagnosis not present

## 2019-12-05 DIAGNOSIS — K219 Gastro-esophageal reflux disease without esophagitis: Secondary | ICD-10-CM | POA: Diagnosis not present

## 2019-12-07 ENCOUNTER — Ambulatory Visit: Payer: PPO | Admitting: Orthopaedic Surgery

## 2019-12-12 DIAGNOSIS — F064 Anxiety disorder due to known physiological condition: Secondary | ICD-10-CM | POA: Diagnosis not present

## 2019-12-12 DIAGNOSIS — R197 Diarrhea, unspecified: Secondary | ICD-10-CM | POA: Diagnosis not present

## 2019-12-15 DIAGNOSIS — E7801 Familial hypercholesterolemia: Secondary | ICD-10-CM | POA: Diagnosis not present

## 2019-12-15 DIAGNOSIS — I10 Essential (primary) hypertension: Secondary | ICD-10-CM | POA: Diagnosis not present

## 2019-12-15 DIAGNOSIS — E119 Type 2 diabetes mellitus without complications: Secondary | ICD-10-CM | POA: Diagnosis not present

## 2019-12-19 ENCOUNTER — Ambulatory Visit: Payer: PPO | Admitting: Podiatry

## 2019-12-19 ENCOUNTER — Ambulatory Visit: Payer: PPO | Admitting: Neurology

## 2019-12-19 DIAGNOSIS — K219 Gastro-esophageal reflux disease without esophagitis: Secondary | ICD-10-CM | POA: Diagnosis not present

## 2019-12-19 DIAGNOSIS — Z86010 Personal history of colonic polyps: Secondary | ICD-10-CM | POA: Diagnosis not present

## 2019-12-22 ENCOUNTER — Other Ambulatory Visit: Payer: Self-pay | Admitting: Family Medicine

## 2019-12-22 DIAGNOSIS — Z1231 Encounter for screening mammogram for malignant neoplasm of breast: Secondary | ICD-10-CM

## 2019-12-26 ENCOUNTER — Encounter: Payer: Self-pay | Admitting: Internal Medicine

## 2019-12-26 ENCOUNTER — Other Ambulatory Visit: Payer: Self-pay

## 2019-12-26 ENCOUNTER — Ambulatory Visit (INDEPENDENT_AMBULATORY_CARE_PROVIDER_SITE_OTHER): Payer: PPO | Admitting: Internal Medicine

## 2019-12-26 VITALS — BP 134/83 | HR 83 | Wt 215.5 lb

## 2019-12-26 DIAGNOSIS — B182 Chronic viral hepatitis C: Secondary | ICD-10-CM

## 2019-12-26 DIAGNOSIS — I10 Essential (primary) hypertension: Secondary | ICD-10-CM | POA: Diagnosis not present

## 2019-12-26 DIAGNOSIS — M130 Polyarthritis, unspecified: Secondary | ICD-10-CM | POA: Diagnosis not present

## 2019-12-26 DIAGNOSIS — F064 Anxiety disorder due to known physiological condition: Secondary | ICD-10-CM | POA: Diagnosis not present

## 2019-12-26 DIAGNOSIS — K7460 Unspecified cirrhosis of liver: Secondary | ICD-10-CM

## 2019-12-26 LAB — HEPATIC FUNCTION PANEL
AG Ratio: 1.8 (calc) (ref 1.0–2.5)
ALT: 42 U/L — ABNORMAL HIGH (ref 6–29)
AST: 42 U/L — ABNORMAL HIGH (ref 10–35)
Albumin: 4.5 g/dL (ref 3.6–5.1)
Alkaline phosphatase (APISO): 85 U/L (ref 37–153)
Bilirubin, Direct: 0.1 mg/dL (ref 0.0–0.2)
Globulin: 2.5 g/dL (calc) (ref 1.9–3.7)
Indirect Bilirubin: 0.2 mg/dL (calc) (ref 0.2–1.2)
Total Bilirubin: 0.3 mg/dL (ref 0.2–1.2)
Total Protein: 7 g/dL (ref 6.1–8.1)

## 2019-12-26 NOTE — Progress Notes (Signed)
PAIGE YAMASAKI - 74 y.o. female MRN [id number]  Date of birth: October 24, 1945  Office Visit Note: Visit Date: 08/22/2019 PCP: [name], MD Referred by: [name], MD  Subjective: Chief Complaint  Patient presents with  . Lower Back - Pain  . Right Leg - Pain  . Left Leg - Pain   HPI:  JERAMIE BULLARD is a 74 y.o. female who comes in today For planned L4-5 or L5-S1 interlaminar epidural steroid injection at the request of Dr. Joni Fears.  I saw the patient in 2018 and completed L4-5 interlaminar injection with good relief of symptoms.  She has fairly moderate to severe L4-5 multifactorial stenosis and had a history of L5-S1 disc herniation.  Prior to 2018 completed L5-S1 interlaminar injection with good relief.  Her history is complicated by obesity.  She has not had updated MRI of the lumbar spine.  Depending on relief would consider updating that.  She does have polyneuropathy from diabetes and is followed by Dr. Jannifer Franklin.  ROS Otherwise per HPI.  Assessment & Plan: Visit Diagnoses:  1. Spinal stenosis of lumbar region with neurogenic claudication   2. Lumbar radiculopathy     Plan: No additional findings.   Meds & Orders:  Meds ordered this encounter  Medications  . methylPREDNISolone acetate (DEPO-MEDROL) injection 80 mg    Orders Placed This Encounter  Procedures  . XR C-ARM NO REPORT  . Epidural Steroid injection    Follow-up: No follow-ups on file.   Procedures: No procedures performed  Lumbar Epidural Steroid Injection - Interlaminar Approach with Fluoroscopic Guidance  Patient: SHEILDA ALDOUS      Date of Birth: [birth year] MRN: [id number] PCP: [name], MD      Visit Date: 08/22/2019   Universal Protocol:     Consent Given By: the patient  Position: PRONE  Additional Comments: Vital signs were monitored before and after the procedure. Patient was prepped and draped in the usual sterile fashion. The correct patient, procedure, and site was  verified.   Injection Procedure Details:  Procedure Site One Meds Administered:  Meds ordered this encounter  Medications  . methylPREDNISolone acetate (DEPO-MEDROL) injection 80 mg     Laterality: Left  Location/Site:  L4-L5  Needle size: 20 G  Needle type: Tuohy  Needle Placement: Paramedian epidural  Findings:   -Comments: Excellent flow of contrast into the epidural space.  Procedure Details: Using a paramedian approach from the side mentioned above, the region overlying the inferior lamina was localized under fluoroscopic visualization and the soft tissues overlying this structure were infiltrated with 4 ml. of 1% Lidocaine without Epinephrine. The Tuohy needle was inserted into the epidural space using a paramedian approach.   The epidural space was localized using loss of resistance along with lateral and bi-planar fluoroscopic views.  After negative aspirate for air, blood, and CSF, a 2 ml. volume of Isovue-250 was injected into the epidural space and the flow of contrast was observed. Radiographs were obtained for documentation purposes.    The injectate was administered into the level noted above.   Additional Comments:  The patient tolerated the procedure well Dressing: 2 x 2 sterile gauze and Band-Aid    Post-procedure details: Patient was observed during the procedure. Post-procedure instructions were reviewed.  Patient left the clinic in stable condition.    Clinical History: Lumbar spine MRI  12/28/2016  L3-L4: Central and rightward protrusion. Foraminal disc material extends into the extraforaminal compartment on the RIGHT. Posterior element hypertrophy.  Moderate stenosis. RIGHT L3 and BILATERAL L4 nerve root impingement.   L4-L5: Central protrusion. Advanced posterior element hypertrophy. Moderate to severe stenosis. Foraminal and extraforaminal disc material to the LEFT. LEFT greater than RIGHT L4 and L5 nerve root impingement.   L5-S1:  Central extrusion. Posterior element hypertrophy. Disc material extends into both foramina. Mild stenosis. BILATERAL L5 and S1 nerve root impingement.   Compared with 2012, there is worsening at all levels except L1-2, which appears improved since that time.   IMPRESSION: Advanced multilevel spondylosis, most pronounced from L2 through S1. Combination of disc herniations and posterior element hypertrophy results in potentially symptomatic neural impingement at L2-3, L3-4, L4-5, and L5-S1.       Objective:  VS:  HT:    WT:   BMI:     BP:(!) 154/98  HR:89bpm  TEMP: ( )  RESP:  Physical Exam  Ortho Exam Imaging: No results found.

## 2019-12-26 NOTE — Progress Notes (Signed)
[place] for Infectious Disease      Reason for Consult: cirrhosis    Referring Physician: Dr. Criss Rosales    Patient ID: Cathy Gonzalez, female    DOB: Feb 12, 1946, 74 y.o.   MRN: [id number]  HPI:   She has not been seen here in over 3 years.  I saw her previously for chronic hepatitis C and cirrhosis and needed yearly follow up for [facility] screening.  She had a negative virus at SVR 24.  She recently was having abdominal issues and evaluated by Dr. Benson Norway with EGD and colonoscopy and no issues found.  Had some purposeful weight loss.  Cirrhosis noted on previous ultrasound and elastography F3/4.     Past Medical History:  Diagnosis Date  . Arthritis   . Cataract    left cataract removed in 2017  . Diabetes mellitus   . Diabetic neuropathy ([place])   . Diabetic neuropathy, type II diabetes mellitus ([place])   . Hepatitis C    took tx for   . Hypertension   . Hypokalemia   . Knee pain    right   . Morbidly obese ([place])   . Nocturnal seizures ([place]) 05/14/2015   no bad seizure since 1980's  . OSA on CPAP    uses cpap 2-3 week  . Plantar fasciitis    both feet    Prior to Admission medications   Medication Sig Start Date End Date Taking? Authorizing Provider  acetaminophen (TYLENOL) 500 MG tablet Take 500 mg by mouth every 6 (six) hours as needed for moderate pain.    [provider]  amLODipine (NORVASC) 10 MG tablet Take 10 mg by mouth daily. Takes half    [provider]  Blood Glucose Monitoring Suppl (ONE TOUCH ULTRA 2) w/Device KIT 2 (two) times daily. as directed 01/24/19   [provider]  colchicine 0.6 MG tablet Take 0.6 mg by mouth as needed (gout).     [provider]  diclofenac sodium (VOLTAREN) 1 % GEL Apply 1 application topically 3 (three) times daily as needed (feet pain).    [provider]  diclofenac Sodium (VOLTAREN) 1 % GEL SMARTSIG:2 Inch(es) Topical 4 Times Daily 08/17/19   [provider]  ezetimibe (ZETIA)  10 MG tablet Take 10 mg by mouth at bedtime.  02/16/18   [provider]  FIASP FLEXTOUCH 100 UNIT/ML SOPN Inject 4-15 Units into the skin 3 (three) times daily with meals.  01/01/18   [provider]  FLUZONE HIGH-DOSE QUADRIVALENT 0.7 ML SUSY  07/28/19   [provider]  furosemide (LASIX) 40 MG tablet Take 40 mg by mouth daily.  12/03/16   [provider]  insulin degludec (TRESIBA) 100 UNIT/ML SOPN FlexTouch Pen Inject 10 Units into the skin every evening.     [provider]  levETIRAcetam (KEPPRA) 750 MG tablet Take 2 tablets (1,500 mg total) by mouth 2 (two) times daily. 11/21/19   Kathrynn Ducking, MD  metFORMIN (GLUCOPHAGE) 500 MG tablet Take 500 mg by mouth at bedtime.     [provider]  montelukast (SINGULAIR) 10 MG tablet Take 10 mg by mouth at bedtime.    [provider]  NOREL AD 4-10-325 MG TABS TAKE 1 TABLET BY MOUTH 4 TIMES DAILY 04/12/19   [provider]  ONE TOUCH ULTRA TEST test strip  02/06/18   [provider]  ONETOUCH DELICA LANCETS 01K MISC  02/06/18   [provider]  Potassium Chloride ER 20 MEQ TBCR Take 1 tablet by mouth 2 (two) times daily. 04/14/18   [provider]  pregabalin (LYRICA) 300 MG capsule Take 1 capsule (300 mg total) by mouth 2 (two) times daily. 08/26/17   Hyatt, Max T, DPM  sucralfate (CARAFATE) 1 G tablet Take 1 g by mouth 2 (two) times daily.     [provider]  telmisartan (MICARDIS) 80 MG tablet Take 80 mg by mouth daily.  01/21/18   [provider]    No Known Allergies  Social History   Tobacco Use  . Smoking status: Never Smoker  . Smokeless tobacco: Never Used  Substance Use Topics  . Alcohol use: No    Alcohol/week: 0.0 standard drinks  . Drug use: No    Family History  Problem Relation Age of Onset  . Diabetes Mother   . Heart attack Mother   . Diabetes Sister   . Diabetes Brother   . Cancer Sister        breast  .  Breast cancer Sister   . Seizures Neg Hx     Review of Systems  Constitutional: negative for fevers and chills Integument/breast: negative for rash All other systems reviewed and are negative    Constitutional: in no apparent distress There were no vitals filed for this visit. EYES: anicteric Cardiovascular: Cor RRR Respiratory: clear GI: soft Musculoskeletal: no pedal edema noted Skin: negatives: no rash Neuro: non-focal   Labs: Lab Results  Component Value Date   WBC 7.3 02/16/2017   HGB 13.9 02/16/2017   HCT 41.7 02/16/2017   MCV 88.7 02/16/2017   PLT 230 02/16/2017    Lab Results  Component Value Date   CREATININE 1.21 (H) 02/16/2017   BUN 24 (H) 02/16/2017   NA 137 02/16/2017   K 3.4 (L) 02/16/2017   CL 98 (L) 02/16/2017   CO2 26 02/16/2017    Lab Results  Component Value Date   ALT 32 (H) 10/20/2016   AST 24 10/20/2016   ALKPHOS 45 10/20/2016   BILITOT 0.3 10/20/2016   INR 1.14 02/08/2015     Assessment: cirrhosis and chronic hepatitis C.  No significant issues at this time.  I emphasized the need for [facility] screening and yearly visits.   Plan: 1) hepatic panel 2) ultrasound now and in 6 months rtc 1 year

## 2019-12-26 NOTE — Procedures (Signed)
Lumbar Epidural Steroid Injection - Interlaminar Approach with Fluoroscopic Guidance  Patient: Cathy Gonzalez      Date of Birth: January 08, 1946 MRN: [id number] PCP: [name], MD      Visit Date: 08/22/2019   Universal Protocol:     Consent Given By: the patient  Position: PRONE  Additional Comments: Vital signs were monitored before and after the procedure. Patient was prepped and draped in the usual sterile fashion. The correct patient, procedure, and site was verified.   Injection Procedure Details:  Procedure Site One Meds Administered:  Meds ordered this encounter  Medications  . methylPREDNISolone acetate (DEPO-MEDROL) injection 80 mg     Laterality: Left  Location/Site:  L4-L5  Needle size: 20 G  Needle type: Tuohy  Needle Placement: Paramedian epidural  Findings:   -Comments: Excellent flow of contrast into the epidural space.  Procedure Details: Using a paramedian approach from the side mentioned above, the region overlying the inferior lamina was localized under fluoroscopic visualization and the soft tissues overlying this structure were infiltrated with 4 ml. of 1% Lidocaine without Epinephrine. The Tuohy needle was inserted into the epidural space using a paramedian approach.   The epidural space was localized using loss of resistance along with lateral and bi-planar fluoroscopic views.  After negative aspirate for air, blood, and CSF, a 2 ml. volume of Isovue-250 was injected into the epidural space and the flow of contrast was observed. Radiographs were obtained for documentation purposes.    The injectate was administered into the level noted above.   Additional Comments:  The patient tolerated the procedure well Dressing: 2 x 2 sterile gauze and Band-Aid    Post-procedure details: Patient was observed during the procedure. Post-procedure instructions were reviewed.  Patient left the clinic in stable condition.

## 2019-12-27 DIAGNOSIS — G4733 Obstructive sleep apnea (adult) (pediatric): Secondary | ICD-10-CM | POA: Diagnosis not present

## 2020-01-02 ENCOUNTER — Other Ambulatory Visit: Payer: Self-pay

## 2020-01-02 ENCOUNTER — Encounter: Payer: Self-pay | Admitting: Neurology

## 2020-01-02 ENCOUNTER — Ambulatory Visit: Payer: PPO | Admitting: Neurology

## 2020-01-02 VITALS — BP 128/83 | HR 81 | Temp 96.3°F | Ht 64.0 in | Wt 216.0 lb

## 2020-01-02 DIAGNOSIS — G4733 Obstructive sleep apnea (adult) (pediatric): Secondary | ICD-10-CM

## 2020-01-02 DIAGNOSIS — Z9989 Dependence on other enabling machines and devices: Secondary | ICD-10-CM | POA: Diagnosis not present

## 2020-01-02 DIAGNOSIS — G40909 Epilepsy, unspecified, not intractable, without status epilepticus: Secondary | ICD-10-CM | POA: Diagnosis not present

## 2020-01-02 DIAGNOSIS — R569 Unspecified convulsions: Secondary | ICD-10-CM

## 2020-01-02 NOTE — Progress Notes (Signed)
PATIENT: Cathy Gonzalez DOB: 1946-06-16  REASON FOR VISIT: follow up HISTORY FROM: patient  HISTORY OF PRESENT ILLNESS: Today 01/02/20  Cathy Gonzalez is a 74 year old female with history of obesity, diabetes, diabetic peripheral neuropathy, and nocturnal seizures.  She is on CPAP.  She has had 1 brief nocturnal seizure in December 2019, associated with significant stress.  She remains on Keppra and Lyrica.  She has not had recurrent seizure.  Her neuropathy is doing better.  Her diabetes is under good control, recent A1c was 5.6.  She has been able to lose 30 pounds.  She says her gait and balance is more stable.  She has not had any falls.  She continues to use CPAP, but is having issues with the tubing leaking, despite getting new tubing.  She presents today for evaluation accompanied by her great-grandson.  She denies any new problems or concerns.  HISTORY 12/13/2018 Dr. Jannifer Franklin: Cathy Gonzalez is a 74 year old right-handed black female with a history of obesity, diabetes, diabetic peripheral neuropathy, and nocturnal seizures.  The patient also indicates that she has sleep apnea, she has a CPAP machine but she does not tolerate the mask.  The patient indicates that when she tries to use a humidifier with the mask she has problems with the water getting in her throat, when she does not use a humidifier, her mouth dries out.  She wishes to have reevaluation of this issue.  The patient has had 1 brief nocturnal seizure in December 2019 associated with the time of stress when her sister and her sister's son died.  The patient has otherwise gone well over a year without any seizures, she is on Keppra taking 1500 mg twice daily.  The patient tolerates the drug fairly well.  She is also on Lyrica taking 300 mg twice daily.  She does have some mild gait instability, she has not had any falls.   REVIEW OF SYSTEMS: Out of a complete 14 system review of symptoms, the patient complains only of the following symptoms, and  all other reviewed systems are negative.  Sleep apnea, seizure  ALLERGIES: No Known Allergies  HOME MEDICATIONS: Outpatient Medications Prior to Visit  Medication Sig Dispense Refill  . acetaminophen (TYLENOL) 500 MG tablet Take 500 mg by mouth every 6 (six) hours as needed for moderate pain.    [name] amLODipine (NORVASC) 10 MG tablet Take 10 mg by mouth daily. Takes half    . Blood Glucose Monitoring Suppl (ONE TOUCH ULTRA 2) w/Device KIT 2 (two) times daily. as directed    . citalopram (CELEXA) 10 MG tablet Take 10 mg by mouth daily.    . colchicine 0.6 MG tablet Take 0.6 mg by mouth as needed (gout).     [name] diclofenac sodium (VOLTAREN) 1 % GEL Apply 1 application topically 3 (three) times daily as needed (feet pain).    [name] diclofenac Sodium (VOLTAREN) 1 % GEL SMARTSIG:2 Inch(es) Topical 4 Times Daily    . ezetimibe (ZETIA) 10 MG tablet Take 10 mg by mouth at bedtime.     [name] FIASP FLEXTOUCH 100 UNIT/ML SOPN Inject 4-15 Units into the skin 3 (three) times daily with meals.   2  . FLUZONE HIGH-DOSE QUADRIVALENT 0.7 ML SUSY     . furosemide (LASIX) 40 MG tablet Take 40 mg by mouth daily.     . insulin degludec (TRESIBA) 100 UNIT/ML SOPN FlexTouch Pen Inject 10 Units into the skin every evening.     . levETIRAcetam (  KEPPRA) 750 MG tablet Take 2 tablets (1,500 mg total) by mouth 2 (two) times daily. 360 tablet 3  . metFORMIN (GLUCOPHAGE) 500 MG tablet Take 500 mg by mouth at bedtime.     . montelukast (SINGULAIR) 10 MG tablet Take 10 mg by mouth at bedtime.    . NOREL AD 4-10-325 MG TABS TAKE 1 TABLET BY MOUTH 4 TIMES DAILY    . ONE TOUCH ULTRA TEST test strip   5  . ONETOUCH DELICA LANCETS 27O MISC   3  . Potassium Chloride ER 20 MEQ TBCR Take 1 tablet by mouth 2 (two) times daily.  6  . pregabalin (LYRICA) 300 MG capsule Take 1 capsule (300 mg total) by mouth 2 (two) times daily. 60 capsule 0  . sucralfate (CARAFATE) 1 G tablet Take 1 g by mouth 2 (two) times daily.     [name] telmisartan  (MICARDIS) 80 MG tablet Take 80 mg by mouth daily.   1   No facility-administered medications prior to visit.    PAST MEDICAL HISTORY: Past Medical History:  Diagnosis Date  . Arthritis   . Cataract    left cataract removed in 2017  . Diabetes mellitus   . Diabetic neuropathy ([place])   . Diabetic neuropathy, type II diabetes mellitus ([place])   . Hepatitis C    took tx for   . Hypertension   . Hypokalemia   . Knee pain    right   . Morbidly obese ([place])   . Nocturnal seizures ([place]) 05/14/2015   no bad seizure since 1980's  . OSA on CPAP    uses cpap 2-3 week  . Plantar fasciitis    both feet    PAST SURGICAL HISTORY: Past Surgical History:  Procedure Laterality Date  . ABDOMINAL HYSTERECTOMY     partial  . CATARACT EXTRACTION Left   . COLONOSCOPY WITH PROPOFOL N/A 08/21/2015   Procedure: COLONOSCOPY WITH PROPOFOL;  Surgeon: Carol Ada, MD;  Location: [facility];  Service: Endoscopy;  Laterality: N/A;  . ESOPHAGOGASTRODUODENOSCOPY (EGD) WITH PROPOFOL N/A 08/21/2015   Procedure: ESOPHAGOGASTRODUODENOSCOPY (EGD) WITH PROPOFOL;  Surgeon: Carol Ada, MD;  [facility];  Service: Endoscopy;  Laterality: N/A;  . ESOPHAGOGASTRODUODENOSCOPY (EGD) WITH PROPOFOL N/A 10/08/2018   Procedure: ESOPHAGOGASTRODUODENOSCOPY (EGD) WITH PROPOFOL;  Surgeon: Carol Ada, MD;  [facility];  Service: Endoscopy;  Laterality: N/A;  . EYE SURGERY Bilateral    cataracts    FAMILY HISTORY: Family History  Problem Relation Age of Onset  . Diabetes Mother   . Heart attack Mother   . Diabetes Sister   . Diabetes Brother   . Cancer Sister        breast  . Breast cancer Sister   . Seizures Neg Hx     SOCIAL HISTORY: Social History   Socioeconomic History  . Marital status: Single    Spouse name: Not on file  . Number of children: 4  . Years of education: 30  . Highest education level: Not on file  Occupational History  . Occupation: [employer]  Tobacco Use  .  Smoking status: Never Smoker  . Smokeless tobacco: Never Used  Substance and Sexual Activity  . Alcohol use: No    Alcohol/week: 0.0 standard drinks  . Drug use: No  . Sexual activity: Never  Other Topics Concern  . Not on file  Social History Narrative   Patient occasionally drinks caffeine.   Patient is right handed.   Social Determinants of Health  Financial Resource Strain:   . Difficulty of Paying Living Expenses:   Food Insecurity:   . Worried About Charity fundraiser in the Last Year:   . Arboriculturist in the Last Year:   Transportation Needs:   . Film/video editor (Medical):   [name] Lack of Transportation (Non-Medical):   Physical Activity:   . Days of Exercise per Week:   . Minutes of Exercise per Session:   Stress:   . Feeling of Stress :   Social Connections:   . Frequency of Communication with Friends and Family:   . Frequency of Social Gatherings with Friends and Family:   . Attends Religious Services:   . Active Member of Clubs or Organizations:   . Attends Archivist Meetings:   [name] Marital Status:   Intimate Partner Violence:   . Fear of Current or Ex-Partner:   . Emotionally Abused:   [name] Physically Abused:   . Sexually Abused:    PHYSICAL EXAM  Vitals:   01/02/20 0951  BP: 128/83  Pulse: 81  Temp: (!) 96.3 F (35.7 C)  Weight: 216 lb (98 kg)  Height: 5' 4" (1.626 m)   Body mass index is 37.08 kg/m.  Generalized: Well developed, in no acute distress   Neurological examination  Mentation: Alert oriented to time, place, history taking. Follows all commands speech and language fluent Cranial nerve II-XII: Pupils were equal round reactive to light. Extraocular movements were full, visual field were full on confrontational test. Facial sensation and strength were normal. Head turning and shoulder shrug  were normal and symmetric. Motor: The motor testing reveals 5 over 5 strength of all 4 extremities. Good symmetric motor tone is  noted throughout.  Sensory: Sensory testing is intact to soft touch on all 4 extremities. No evidence of extinction is noted.  Coordination: Cerebellar testing reveals good finger-nose-finger and heel-to-shin bilaterally.  Gait and station: Gait is normal. Tandem gait is slightly unsteady. Romberg is negative. No drift is seen.  Reflexes: Deep tendon reflexes are symmetric but decreased.  DIAGNOSTIC DATA (LABS, IMAGING, TESTING) - I reviewed patient records, labs, notes, testing and imaging myself where available.  Lab Results  Component Value Date   WBC 7.3 02/16/2017   HGB 13.9 02/16/2017   HCT 41.7 02/16/2017   MCV 88.7 02/16/2017   PLT 230 02/16/2017      Component Value Date/Time   NA 137 02/16/2017 1325   NA 140 08/11/2016 0833   K 3.4 (L) 02/16/2017 1325   CL 98 (L) 02/16/2017 1325   CO2 26 02/16/2017 1325   GLUCOSE 230 (H) 02/16/2017 1325   BUN 24 (H) 02/16/2017 1325   BUN 23 08/11/2016 0833   CREATININE 1.21 (H) 02/16/2017 1325   CREATININE 1.08 (H) 10/20/2016 1549   CALCIUM 10.2 02/16/2017 1325   PROT 7.0 12/26/2019 0903   PROT 7.2 08/11/2016 0833   ALBUMIN 4.6 10/20/2016 1549   ALBUMIN 4.5 08/11/2016 0833   AST 42 (H) 12/26/2019 0903   ALT 42 (H) 12/26/2019 0903   ALKPHOS 45 10/20/2016 1549   BILITOT 0.3 12/26/2019 0903   BILITOT <0.2 08/11/2016 0833   GFRNONAA 45 (L) 02/16/2017 1325   GFRNONAA 53 (L) 10/20/2016 1549   GFRAA 52 (L) 02/16/2017 1325   GFRAA 61 10/20/2016 1549   Lab Results  Component Value Date   CHOL 216 (H) 02/11/2014   HDL 65 02/11/2014   LDLCALC 133 (H) 02/11/2014   TRIG 92 02/11/2014  CHOLHDL 3.3 02/11/2014   Lab Results  Component Value Date   HGBA1C 7.2 (H) 02/11/2014   No results found for: VITAMINB12 No results found for: TSH   ASSESSMENT AND PLAN 74 y.o. year old female  has a past medical history of Arthritis, Cataract, Diabetes mellitus, Diabetic neuropathy ([place]), Diabetic neuropathy, type II diabetes mellitus ([place]),  Hepatitis C, Hypertension, Hypokalemia, Knee pain, Morbidly obese ([place]), Nocturnal seizures ([place]) (05/14/2015), OSA on CPAP, and Plantar fasciitis. here with:  1.  Nocturnal seizures 2.  Sleep apnea, on CPAP 3.  Diabetic peripheral neuropathy  She has not had recurrent seizure since last seen.  She will remain on Keppra 1500 mg twice a day.  For CPAP, she reports issues with the tubing leaking, our nurse has reached out to her DME regarding the issue.  I have encouraged her to use her CPAP every night.  She will call for recurrent seizure, otherwise follow-up 1 year or sooner if needed.  I spent 20 minutes of face-to-face and non-face-to-face time with patient.  This included previsit chart review, lab review, study review, order entry, electronic health record documentation, patient education.   Butler Denmark, AGNP-C, DNP 01/02/2020, 10:11 AM [facility] [address] [place], [place] [address] [phone]

## 2020-01-02 NOTE — Progress Notes (Signed)
I have read the note, and I agree with the clinical assessment and plan.  Christiana Gurevich K Jaydn Moscato   

## 2020-01-02 NOTE — Patient Instructions (Signed)
It was nice to meet you today! Continue taking Keppra as prescribed  Please use CPAP every night See you in 1 year for seizures.

## 2020-01-09 ENCOUNTER — Ambulatory Visit
Admission: RE | Admit: 2020-01-09 | Discharge: 2020-01-09 | Disposition: A | Discharge status: Home / Self Care | Payer: PPO | Source: Ambulatory Visit | Attending: Internal Medicine | Admitting: Internal Medicine

## 2020-01-09 DIAGNOSIS — B1920 Unspecified viral hepatitis C without hepatic coma: Secondary | ICD-10-CM | POA: Diagnosis not present

## 2020-01-09 DIAGNOSIS — K760 Fatty (change of) liver, not elsewhere classified: Secondary | ICD-10-CM | POA: Diagnosis not present

## 2020-01-09 DIAGNOSIS — K7460 Unspecified cirrhosis of liver: Secondary | ICD-10-CM

## 2020-01-09 DIAGNOSIS — B182 Chronic viral hepatitis C: Secondary | ICD-10-CM

## 2020-01-23 DIAGNOSIS — G4733 Obstructive sleep apnea (adult) (pediatric): Secondary | ICD-10-CM | POA: Diagnosis not present

## 2020-01-27 DIAGNOSIS — G4733 Obstructive sleep apnea (adult) (pediatric): Secondary | ICD-10-CM | POA: Diagnosis not present

## 2020-01-30 DIAGNOSIS — E1169 Type 2 diabetes mellitus with other specified complication: Secondary | ICD-10-CM | POA: Diagnosis not present

## 2020-01-30 DIAGNOSIS — I10 Essential (primary) hypertension: Secondary | ICD-10-CM | POA: Diagnosis not present

## 2020-01-30 DIAGNOSIS — M130 Polyarthritis, unspecified: Secondary | ICD-10-CM | POA: Diagnosis not present

## 2020-01-30 DIAGNOSIS — R635 Abnormal weight gain: Secondary | ICD-10-CM | POA: Diagnosis not present

## 2020-01-30 DIAGNOSIS — G4730 Sleep apnea, unspecified: Secondary | ICD-10-CM | POA: Diagnosis not present

## 2020-01-30 DIAGNOSIS — B182 Chronic viral hepatitis C: Secondary | ICD-10-CM | POA: Diagnosis not present

## 2020-02-03 DIAGNOSIS — E119 Type 2 diabetes mellitus without complications: Secondary | ICD-10-CM | POA: Diagnosis not present

## 2020-02-03 DIAGNOSIS — I10 Essential (primary) hypertension: Secondary | ICD-10-CM | POA: Diagnosis not present

## 2020-02-03 DIAGNOSIS — E7801 Familial hypercholesterolemia: Secondary | ICD-10-CM | POA: Diagnosis not present

## 2020-02-15 ENCOUNTER — Encounter: Payer: Self-pay | Admitting: Orthopaedic Surgery

## 2020-02-15 ENCOUNTER — Ambulatory Visit (INDEPENDENT_AMBULATORY_CARE_PROVIDER_SITE_OTHER): Payer: HMO | Admitting: Orthopaedic Surgery

## 2020-02-15 ENCOUNTER — Other Ambulatory Visit: Payer: Self-pay

## 2020-02-15 VITALS — Ht 64.0 in | Wt 223.0 lb

## 2020-02-15 DIAGNOSIS — G4733 Obstructive sleep apnea (adult) (pediatric): Secondary | ICD-10-CM | POA: Diagnosis not present

## 2020-02-15 DIAGNOSIS — G8929 Other chronic pain: Secondary | ICD-10-CM | POA: Diagnosis not present

## 2020-02-15 DIAGNOSIS — M25561 Pain in right knee: Secondary | ICD-10-CM

## 2020-02-15 MED ORDER — LIDOCAINE HCL 1 % IJ SOLN
2.0000 mL | INTRAMUSCULAR | Status: AC | PRN
  Administered 2020-02-15: 2 mL

## 2020-02-15 MED ORDER — METHYLPREDNISOLONE ACETATE 40 MG/ML IJ SUSP
80.0000 mg | INTRAMUSCULAR | Status: AC | PRN
  Administered 2020-02-15: 80 mg via INTRA_ARTICULAR

## 2020-02-15 MED ORDER — BUPIVACAINE HCL 0.5 % IJ SOLN
2.0000 mL | INTRAMUSCULAR | Status: AC | PRN
  Administered 2020-02-15: 2 mL via INTRA_ARTICULAR

## 2020-02-15 NOTE — Progress Notes (Signed)
Office Visit Note   Patient: Cathy Gonzalez           Date of Birth: [birth year]           MRN: [id number] Visit Date: 02/15/2020              Requested by: [name], [address] [place],  [place] [address] PCP: [name], MD   Assessment & Plan: Visit Diagnoses:  1. Chronic pain of right knee     Plan: Prior films demonstrated osteoarthritis of the right knee in all 3 compartments but predominantly medially.  We have discussed her arthritis and what she may expect over time.  She like to have another cortisone injection and try a support brace.  I injected the knee with Depo-Medrol without problem and applied a spider brace and she felt much better.  We will plan to see her back as needed Follow-Up Instructions: Return if symptoms worsen or fail to improve.   Orders:  No orders of the defined types were placed in this encounter.  No orders of the defined types were placed in this encounter.     Procedures: Large Joint Inj: R knee on 02/15/2020 1:43 PM Indications: pain and diagnostic evaluation Details: 25 G 1.5 in needle, anteromedial approach  Arthrogram: No  Medications: 2 mL lidocaine 1 %; 2 mL bupivacaine 0.5 %; 80 mg methylPREDNISolone acetate 40 MG/ML Procedure, treatment alternatives, risks and benefits explained, specific risks discussed. Consent was given by the patient. Immediately prior to procedure a time out was called to verify the correct patient, procedure, equipment, support staff and site/side marked as required. Patient was prepped and draped in the usual sterile fashion.       Clinical Data: No additional findings.   Subjective: Chief Complaint  Patient presents with  . Right Knee - Pain  Patient presents today for recurrent right knee pain. She said that it has been hurting again for about a month. She received a cortisone injection in March of last year and states that it helped. She said that it pops and swells. No grinding or  giving way. She takes tylenol for pain. She is wanting to get another injection today. She also has complaints of bilateral foot pain. She is diabetic. She describes a burning sensation in the bottom of her foot just behind her toes. She states that this been going on for years. She takes Lyrica.  HPI  Review of Systems   Objective: Vital Signs: Ht 5\' 4"  (1.626 m)   Wt 223 lb (101.2 kg)   BMI 38.28 kg/m   Physical Exam Constitutional:      Appearance: She is well-developed.  Eyes:     Pupils: Pupils are equal, round, and reactive to light.  Pulmonary:     Effort: Pulmonary effort is normal.  Skin:    General: Skin is warm and dry.  Neurological:     Mental Status: She is alert and oriented to person, place, and time.  Psychiatric:        Behavior: Behavior normal.     Ortho Exam awake alert and oriented x3.  Comfortable sitting.  Had very minimal effusion of her right knee.  The knee was not hot warm or red.  Full extension and flex about 103 to 4 degrees.  No instability.  No popliteal pain.  No calf pain.  Does have some burning in both of her feet which could be related to her diabetes and  neuropathy  Specialty Comments:  No specialty comments available.  Imaging: No results found.   PMFS History: Patient Active Problem List   Diagnosis Date Noted  . Low back pain 07/14/2019  . OSA on CPAP 05/16/2019  . Diabetic neuropathy associated with type 2 diabetes mellitus ([place]) 12/13/2018  . Chronic pain of right knee 12/09/2018  . Thumb pain, left 12/09/2018  . Dizziness and giddiness 01/14/2016  . Hepatic cirrhosis ([place]) 07/26/2015  . Nocturnal seizures ([place]) 05/14/2015  . Chronic hepatitis C without hepatic coma ([place]) 03/14/2015  . Vertigo, central 02/11/2014  . Difficulty walking 02/11/2014  . Diabetes mellitus ([place]) 02/11/2014  . Dysphagia 02/11/2014  . OBESITY 07/26/2010  . Essential hypertension, benign 07/26/2010  . DEGENERATIVE JOINT DISEASE, GENERALIZED  07/26/2010   Past Medical History:  Diagnosis Date  . Arthritis   . Cataract    left cataract removed in 2017  . Diabetes mellitus   . Diabetic neuropathy ([place])   . Diabetic neuropathy, type II diabetes mellitus ([place])   . Hepatitis C    took tx for   . Hypertension   . Hypokalemia   . Knee pain    right   . Morbidly obese ([place])   . Nocturnal seizures ([place]) 05/14/2015   no bad seizure since 1980's  . OSA on CPAP    uses cpap 2-3 week  . Plantar fasciitis    both feet    Family History  Problem Relation Age of Onset  . Diabetes Mother   . Heart attack Mother   . Diabetes Sister   . Diabetes Brother   . Cancer Sister        breast  . Breast cancer Sister   . Seizures Neg Hx     Past Surgical History:  Procedure Laterality Date  . ABDOMINAL HYSTERECTOMY     partial  . CATARACT EXTRACTION Left   . COLONOSCOPY WITH PROPOFOL N/A 08/21/2015   Procedure: COLONOSCOPY WITH PROPOFOL;  Surgeon: Carol Ada, MD;  [facility];  Service: Endoscopy;  Laterality: N/A;  . ESOPHAGOGASTRODUODENOSCOPY (EGD) WITH PROPOFOL N/A 08/21/2015   Procedure: ESOPHAGOGASTRODUODENOSCOPY (EGD) WITH PROPOFOL;  Surgeon: Carol Ada, MD;  [facility];  Service: Endoscopy;  Laterality: N/A;  . ESOPHAGOGASTRODUODENOSCOPY (EGD) WITH PROPOFOL N/A 10/08/2018   Procedure: ESOPHAGOGASTRODUODENOSCOPY (EGD) WITH PROPOFOL;  Surgeon: Carol Ada, MD;  [facility];  Service: Endoscopy;  Laterality: N/A;  . EYE SURGERY Bilateral    cataracts   Social History   Occupational History  . Occupation: [employer]  Tobacco Use  . Smoking status: Never Smoker  . Smokeless tobacco: Never Used  Substance and Sexual Activity  . Alcohol use: No    Alcohol/week: 0.0 standard drinks  . Drug use: No  . Sexual activity: Never

## 2020-02-26 DIAGNOSIS — G4733 Obstructive sleep apnea (adult) (pediatric): Secondary | ICD-10-CM | POA: Diagnosis not present

## 2020-02-27 ENCOUNTER — Ambulatory Visit (INDEPENDENT_AMBULATORY_CARE_PROVIDER_SITE_OTHER): Payer: HMO | Admitting: Podiatry

## 2020-02-27 ENCOUNTER — Other Ambulatory Visit: Payer: Self-pay

## 2020-02-27 DIAGNOSIS — M79675 Pain in left toe(s): Secondary | ICD-10-CM | POA: Diagnosis not present

## 2020-02-27 DIAGNOSIS — L84 Corns and callosities: Secondary | ICD-10-CM

## 2020-02-27 DIAGNOSIS — M79674 Pain in right toe(s): Secondary | ICD-10-CM | POA: Diagnosis not present

## 2020-02-27 DIAGNOSIS — B351 Tinea unguium: Secondary | ICD-10-CM

## 2020-02-27 DIAGNOSIS — E1142 Type 2 diabetes mellitus with diabetic polyneuropathy: Secondary | ICD-10-CM

## 2020-02-27 NOTE — Patient Instructions (Signed)
Diabetes Mellitus and Foot Care Foot care is an important part of your health, especially when you have diabetes. Diabetes may cause you to have problems because of poor blood flow (circulation) to your feet and legs, which can cause your skin to:  Become thinner and drier.  Break more easily.  Heal more slowly.  Peel and crack. You may also have nerve damage (neuropathy) in your legs and feet, causing decreased feeling in them. This means that you may not notice minor injuries to your feet that could lead to more serious problems. Noticing and addressing any potential problems early is the best way to prevent future foot problems. How to care for your feet Foot hygiene  Wash your feet daily with warm water and mild soap. Do not use hot water. Then, pat your feet and the areas between your toes until they are completely dry. Do not soak your feet as this can dry your skin.  Trim your toenails straight across. Do not dig under them or around the cuticle. File the edges of your nails with an emery board or nail file.  Apply a moisturizing lotion or petroleum jelly to the skin on your feet and to dry, brittle toenails. Use lotion that does not contain alcohol and is unscented. Do not apply lotion between your toes. Shoes and socks  Wear clean socks or stockings every day. Make sure they are not too tight. Do not wear knee-high stockings since they may decrease blood flow to your legs.  Wear shoes that fit properly and have enough cushioning. Always look in your shoes before you put them on to be sure there are no objects inside.  To break in new shoes, wear them for just a few hours a day. This prevents injuries on your feet. Wounds, scrapes, corns, and calluses  Check your feet daily for blisters, cuts, bruises, sores, and redness. If you cannot see the bottom of your feet, use a mirror or ask someone for help.  Do not cut corns or calluses or try to remove them with medicine.  If you  find a minor scrape, cut, or break in the skin on your feet, keep it and the skin around it clean and dry. You may clean these areas with mild soap and water. Do not clean the area with peroxide, alcohol, or iodine.  If you have a wound, scrape, corn, or callus on your foot, look at it several times a day to make sure it is healing and not infected. Check for: ? Redness, swelling, or pain. ? Fluid or blood. ? Warmth. ? Pus or a bad smell. General instructions  Do not cross your legs. This may decrease blood flow to your feet.  Do not use heating pads or hot water bottles on your feet. They may burn your skin. If you have lost feeling in your feet or legs, you may not know this is happening until it is too late.  Protect your feet from hot and cold by wearing shoes, such as at the beach or on hot pavement.  Schedule a complete foot exam at least once a year (annually) or more often if you have foot problems. If you have foot problems, report any cuts, sores, or bruises to your health care provider immediately. Contact a health care provider if:  You have a medical condition that increases your risk of infection and you have any cuts, sores, or bruises on your feet.  You have an injury that is not   healing.  You have redness on your legs or feet.  You feel burning or tingling in your legs or feet.  You have pain or cramps in your legs and feet.  Your legs or feet are numb.  Your feet always feel cold.  You have pain around a toenail. Get help right away if:  You have a wound, scrape, corn, or callus on your foot and: ? You have pain, swelling, or redness that gets worse. ? You have fluid or blood coming from the wound, scrape, corn, or callus. ? Your wound, scrape, corn, or callus feels warm to the touch. ? You have pus or a bad smell coming from the wound, scrape, corn, or callus. ? You have a fever. ? You have a red line going up your leg. Summary  Check your feet every day  for cuts, sores, red spots, swelling, and blisters.  Moisturize feet and legs daily.  Wear shoes that fit properly and have enough cushioning.  If you have foot problems, report any cuts, sores, or bruises to your health care provider immediately.  Schedule a complete foot exam at least once a year (annually) or more often if you have foot problems. This information is not intended to replace advice given to you by your health care provider. Make sure you discuss any questions you have with your health care provider. Document Revised: 06/15/2019 Document Reviewed: 10/24/2016 Elsevier Patient Education  2020 [employer]vier Inc[employer]  

## 2020-03-05 ENCOUNTER — Encounter: Payer: Self-pay | Admitting: Podiatry

## 2020-03-05 DIAGNOSIS — I10 Essential (primary) hypertension: Secondary | ICD-10-CM | POA: Diagnosis not present

## 2020-03-05 DIAGNOSIS — E119 Type 2 diabetes mellitus without complications: Secondary | ICD-10-CM | POA: Diagnosis not present

## 2020-03-05 DIAGNOSIS — E7801 Familial hypercholesterolemia: Secondary | ICD-10-CM | POA: Diagnosis not present

## 2020-03-05 NOTE — Progress Notes (Signed)
Subjective: Cathy Gonzalez is a 74 y.o. female patient seen today at risk foot care with history of diabetic neuropathy and painful corn(s) b/l feet and callus(es) left foot and painful mycotic nails b/l.  Pain interferes with ambulation. Aggravating factors include wearing enclosed shoe gear.  Patient Active Problem List   Diagnosis Date Noted  . Low back pain 07/14/2019  . OSA on CPAP 05/16/2019  . Diabetic neuropathy associated with type 2 diabetes mellitus ([place]) 12/13/2018  . Chronic pain of right knee 12/09/2018  . Thumb pain, left 12/09/2018  . Dizziness and giddiness 01/14/2016  . Hepatic cirrhosis ([place]) 07/26/2015  . Nocturnal seizures ([place]) 05/14/2015  . Chronic hepatitis C without hepatic coma ([place]) 03/14/2015  . Vertigo, central 02/11/2014  . Difficulty walking 02/11/2014  . Diabetes mellitus ([place]) 02/11/2014  . Dysphagia 02/11/2014  . OBESITY 07/26/2010  . Essential hypertension, benign 07/26/2010  . DEGENERATIVE JOINT DISEASE, GENERALIZED 07/26/2010    Current Outpatient Medications on File Prior to Visit  Medication Sig Dispense Refill  . acetaminophen (TYLENOL) 500 MG tablet Take 500 mg by mouth every 6 (six) hours as needed for moderate pain.    [name] amLODipine (NORVASC) 10 MG tablet Take 10 mg by mouth daily. Takes half    . Blood Glucose Monitoring Suppl (ONE TOUCH ULTRA 2) w/Device KIT 2 (two) times daily. as directed    . citalopram (CELEXA) 10 MG tablet Take 10 mg by mouth daily.    . colchicine 0.6 MG tablet Take 0.6 mg by mouth as needed (gout).     [name] diclofenac sodium (VOLTAREN) 1 % GEL Apply 1 application topically 3 (three) times daily as needed (feet pain).    [name] diclofenac Sodium (VOLTAREN) 1 % GEL SMARTSIG:2 Inch(es) Topical 4 Times Daily    . ezetimibe (ZETIA) 10 MG tablet Take 10 mg by mouth at bedtime.     [name] FIASP FLEXTOUCH 100 UNIT/ML SOPN Inject 4-15 Units into the skin 3 (three) times daily with meals.   2  . FLUZONE HIGH-DOSE QUADRIVALENT 0.7 ML SUSY      . furosemide (LASIX) 40 MG tablet Take 40 mg by mouth daily.     . insulin degludec (TRESIBA) 100 UNIT/ML SOPN FlexTouch Pen Inject 10 Units into the skin every evening.     . levETIRAcetam (KEPPRA) 750 MG tablet Take 2 tablets (1,500 mg total) by mouth 2 (two) times daily. 360 tablet 3  . LORazepam (ATIVAN) 1 MG tablet Take 1 mg by mouth 3 (three) times daily.    . metFORMIN (GLUCOPHAGE) 500 MG tablet Take 500 mg by mouth at bedtime.     . montelukast (SINGULAIR) 10 MG tablet Take 10 mg by mouth at bedtime.    . NOREL AD 4-10-325 MG TABS TAKE 1 TABLET BY MOUTH 4 TIMES DAILY    . ONE TOUCH ULTRA TEST test strip   5  . ONETOUCH DELICA LANCETS 16S MISC   3  . Potassium Chloride ER 20 MEQ TBCR Take 1 tablet by mouth 2 (two) times daily.  6  . pregabalin (LYRICA) 300 MG capsule Take 1 capsule (300 mg total) by mouth 2 (two) times daily. 60 capsule 0  . sucralfate (CARAFATE) 1 G tablet Take 1 g by mouth 2 (two) times daily.     [name] telmisartan (MICARDIS) 80 MG tablet Take 80 mg by mouth daily.   1   No current facility-administered medications on file prior to visit.    No Known Allergies  Objective:  Physical Exam  General: Cathy Gonzalez is a pleasant 74 y.o.  African American female, in NAD. AAO x 3.   Vascular:  Neurovascular status unchanged b/l. Capillary refill time to digits immediate b/l. Palpable DP pulses b/l. Palpable PT pulses b/l. Pedal hair absent b/l Skin temperature gradient within normal limits b/l.  Dermatological:  Pedal skin with normal turgor, texture and tone bilaterally. No open wounds bilaterally. No interdigital macerations bilaterally. Toenails L 3rd toe, L 4th toe, L 5th toe, R 2nd toe, R 3rd toe, R 4th toe and R 5th toe elongated, discolored, dystrophic, thickened, and crumbly with subungual debris and tenderness to dorsal palpation. Hyperkeratotic lesion(s) lateral nailfolds b/l 5th digits and submet head 5 left foot.  No erythema, no edema, no drainage, no  flocculence.  Musculoskeletal:  Normal muscle strength 5/5 to all lower extremity muscle groups bilaterally. No gross bony deformities bilaterally. No pain crepitus or joint limitation noted with ROM b/l.  Neurological:  Protective sensation diminished with 10g monofilament b/l.  Assessment and Plan:  1. Pain due to onychomycosis of toenails of both feet   2. Corns and callosities   3. Diabetic peripheral neuropathy associated with type 2 diabetes mellitus ([place])    -Examined patient. -No new findings. No new orders. -Continue diabetic foot care principles. Literature dispensed on today.  -Toenails L 3rd toe, L 4th toe, L 5th toe, R hallux, R 3rd toe, R 4th toe and R 5th toe debrided in length and girth without iatrogenic bleeding with sterile nail nipper and dremel.  -Patient to continue soft, supportive shoe gear daily. -Patient to report any pedal injuries to medical professional immediately. -Patient/POA to call should there be question/concern in the interim.  Return in about 3 months (around 05/29/2020) for diabetic nail and callus trim.  Marzetta Board, DPM

## 2020-03-12 DIAGNOSIS — I10 Essential (primary) hypertension: Secondary | ICD-10-CM | POA: Diagnosis not present

## 2020-03-12 DIAGNOSIS — E1169 Type 2 diabetes mellitus with other specified complication: Secondary | ICD-10-CM | POA: Diagnosis not present

## 2020-03-12 DIAGNOSIS — E6609 Other obesity due to excess calories: Secondary | ICD-10-CM | POA: Diagnosis not present

## 2020-03-12 DIAGNOSIS — M130 Polyarthritis, unspecified: Secondary | ICD-10-CM | POA: Diagnosis not present

## 2020-03-15 ENCOUNTER — Ambulatory Visit (INDEPENDENT_AMBULATORY_CARE_PROVIDER_SITE_OTHER): Payer: HMO

## 2020-03-15 ENCOUNTER — Ambulatory Visit: Payer: HMO | Admitting: Podiatry

## 2020-03-15 ENCOUNTER — Encounter: Payer: Self-pay | Admitting: Podiatry

## 2020-03-15 ENCOUNTER — Other Ambulatory Visit: Payer: Self-pay

## 2020-03-15 DIAGNOSIS — M722 Plantar fascial fibromatosis: Secondary | ICD-10-CM

## 2020-03-15 DIAGNOSIS — M778 Other enthesopathies, not elsewhere classified: Secondary | ICD-10-CM

## 2020-03-15 NOTE — Progress Notes (Signed)
She presents today states that is been a while since he saw me back she would like to consider getting her bunion fixed and that her hemoglobin A1c is down to a 5.6.  She is also complaining of bilateral heel pain and forefoot pain.  Objective: Vital signs stable alert oriented x3 pulses are palpable.  She has no pain on range of motion of the first metatarsophalangeal joint however she does have severe pain with palpation medial calcaneal tubercles bilaterally and tenderness at the lesser metatarsophalangeal joints on palpation and range of motion.  Radiographs taken today of the right and left foot demonstrate osseously mature individual with hallux abductovalgus deformity bilateral right greater than left.  Soft tissue increase in density plantar fascial pain insertion sites bilaterally.  Assessment: Pain in limb secondary plantar fasciitis bilateral.  Plan: Injected bilateral heels today 20 mg Kenalog 5 mg Marcaine point maximal tenderness.  We will follow-up with her in about 6 weeks or so.

## 2020-03-19 ENCOUNTER — Ambulatory Visit
Admission: RE | Admit: 2020-03-19 | Discharge: 2020-03-19 | Disposition: A | Discharge status: Home / Self Care | Payer: HMO | Source: Ambulatory Visit | Attending: Family Medicine | Admitting: Family Medicine

## 2020-03-19 ENCOUNTER — Other Ambulatory Visit: Payer: Self-pay | Admitting: Family Medicine

## 2020-03-19 DIAGNOSIS — M1611 Unilateral primary osteoarthritis, right hip: Secondary | ICD-10-CM | POA: Diagnosis not present

## 2020-03-19 DIAGNOSIS — R52 Pain, unspecified: Secondary | ICD-10-CM

## 2020-03-19 DIAGNOSIS — E1169 Type 2 diabetes mellitus with other specified complication: Secondary | ICD-10-CM | POA: Diagnosis not present

## 2020-03-19 DIAGNOSIS — R143 Flatulence: Secondary | ICD-10-CM | POA: Diagnosis not present

## 2020-03-19 DIAGNOSIS — M1711 Unilateral primary osteoarthritis, right knee: Secondary | ICD-10-CM | POA: Diagnosis not present

## 2020-03-28 DIAGNOSIS — G4733 Obstructive sleep apnea (adult) (pediatric): Secondary | ICD-10-CM | POA: Diagnosis not present

## 2020-04-02 ENCOUNTER — Other Ambulatory Visit: Payer: Self-pay

## 2020-04-02 DIAGNOSIS — I10 Essential (primary) hypertension: Secondary | ICD-10-CM | POA: Diagnosis not present

## 2020-04-02 DIAGNOSIS — E1169 Type 2 diabetes mellitus with other specified complication: Secondary | ICD-10-CM | POA: Diagnosis not present

## 2020-04-02 DIAGNOSIS — G629 Polyneuropathy, unspecified: Secondary | ICD-10-CM | POA: Diagnosis not present

## 2020-04-02 DIAGNOSIS — E785 Hyperlipidemia, unspecified: Secondary | ICD-10-CM | POA: Diagnosis not present

## 2020-04-02 NOTE — Patient Outreach (Signed)
  [facility]) Care Management Chronic Special Needs Program    04/02/2020  Name: Cathy Gonzalez, DOB: 05-28-1946  MRN: [id number]   [name] is enrolled in a chronic special needs plan.  Telephone call to client for CSNP initial assessment / Health risk assessment review. Unable to reach client. HIPAA compliant voice message left with call back phone number and return call request.    PLAN: RNCM will attempt 2nd telephone call to client in 1 month.   Quinn Plowman RN,BSN,CCM [facility] Care Management [phone]

## 2020-04-04 DIAGNOSIS — E7801 Familial hypercholesterolemia: Secondary | ICD-10-CM | POA: Diagnosis not present

## 2020-04-04 DIAGNOSIS — I10 Essential (primary) hypertension: Secondary | ICD-10-CM | POA: Diagnosis not present

## 2020-04-04 DIAGNOSIS — E119 Type 2 diabetes mellitus without complications: Secondary | ICD-10-CM | POA: Diagnosis not present

## 2020-04-06 ENCOUNTER — Other Ambulatory Visit: Payer: Self-pay

## 2020-04-06 NOTE — Patient Outreach (Signed)
  [facility]) Care Management Chronic Special Needs Program    04/06/2020  Name: Cathy Gonzalez, DOB: December 21, 1945  MRN: [id number]   [name] is enrolled in a chronic special needs plan for Diabetes. Second telephone call to client for Health risk assessment review/ initial assessment outreach. Unable to reach. HIPAA compliant voice message left with call back phone number.   PLAN; RNCM will attempt 3rd telephone call to client in 3 weeks.   Quinn Plowman RN,BSN,CCM [facility] Care Management [phone]

## 2020-04-10 ENCOUNTER — Other Ambulatory Visit: Payer: Self-pay

## 2020-04-10 NOTE — Patient Outreach (Signed)
[facility]) Care Management Chronic Special Needs Program  04/10/2020  Name: CHASELYNN KEPPLE DOB: 05-15-1946  MRN: [id number]  Ms. Carinna Newhart is enrolled in a chronic special needs plan for Diabetes. Telephone call to client for Health risk assessment review/ initial telephone outreach. Client reports she is unable to afford her medication Linzess. Client states her doctor provider her samples but she will be running out soon. She states she is scheduled to follow up with her primary care provider again on 05/02/20. RNCM offered to refer client to pharmacist for medication assistance. Client verbally agreed. RNCM also advised client to contact her provider to request additional samples. Client verbalized understanding.  Client reports she is proud of her recent Hgb A1c of 5.6%. She reports she has lost 30 lbs. Client reports her right knee causes her pain off and on.  She states she sees an orthopedic doctor for this and has received an injection in the past.    Reviewed and updated care plan.  Subjective:  Goals Addressed              This Visit's Progress   .   Acknowledge receipt of Programme researcher, broadcasting/film/video        RN case manager will mail client Advance directive packet. Contact your RN case manager if you have questions.    [name]  "I want to be able to afford my medicine" (pt-stated)        RN case manager will refer client to pharmacist for medication assistance Contact your prescribing physician to request samples of Linzess    .  Client understands the importance of follow-up with providers by attending scheduled visits        Primary care provider visit:  10/31/19, 12/12/19, 01/30/20 Client reports her next annual wellness visit is 06/18/20 Podiatry visit 02/27/20 and 03/15/20    .  Client will verbalize knowledge of self management of Hypertension as evidences by BP reading of 140/90 or less; or as defined by provider        Plan to check your blood pressure regularly.  If you do not have a blood pressure monitor one can be provided to you.   Continue to take your blood pressure medication as prescribed.  Plan to eat a low salt and heart healthy meals full of fruits, vegetables, whole grains, lean protein and limit fat and sugars Increase your activity as tolerated and as recommended by your doctor RN case manager will mail client education article: Low salt diet, High blood pressure in adults and heart healthy diet.     .  Client will verbalized decrease in right knee pain in 12 months        Continue to follow up with your orthopedic doctor as recommended.  Take prescribed pain medication as recommended by your doctor.  Use rest, ice and elevation to help with decreasing knee pain RN case manager will mail client education article: Knee Pain    .  HEMOGLOBIN A1C < 7        Congratulations your  Hgb A1c is below goal at 5.6. Have your Hgb A1c checked at least every 6 months if you are at goal, every 3 months if you are not at goal, or as recommended by your provider.   Discussed diabetes self management actions:  Glucose monitoring per provider recommendation  Check feet daily  Visit provider every 3-6 months as directed  Hbg A1C level every 3-6 months.  Eye Exam yearly  Carbohydrate controlled meal planning  Taking diabetes medication as prescribed by provider  Physical activity  Review [employer] calendar sent in the mail for Diabetes    .  Maintain timely refills of diabetic medication as prescribed within the year .        Client reports she maintains timely refills of her diabetes medication RN case manager will refer client to pharmacist for medication assistance with Linzess  Contact your provider to request samples of Linzess.     .  COMPLETED: Obtain annual  Lipid Profile, LDL-C        Lipid profile completed 01/30/20     .  Obtain Annual Eye (retinal)  Exam         Client reports having annual eye exam February  2021. Diabetes can affect your vision so it is important to have your eye exam annually.      .  Obtain Annual Foot Exam        Your last documented foot exam was 03/09/19 It is important that your doctor check your feet regularly.  Discuss this with your doctor at your next visit.  Diabetes foot care - Check feet daily at home (look for skin color changes, cuts, sores or cracks in the skin, swelling of feet or ankles, ingrown or fungal toenails, corn or calluses). Report these findings to your doctor - Wash feet with soap and water, dry feet well especially between toes - Moisturize your feet but not between the toes - Always wear shoes that protect your whole feet.      .  Obtain annual screen for micro albuminuria (urine) , nephropathy (kidney problems)        Unable to determine your micro albuminuria completion date Diabetes can affect your kidney's.  It is important for your doctor to check your urine at least once a year.  These test show how your kidney's are working. Please discuss this with your doctor at your next visit.     Illa Level Hemoglobin A1C at least 2 times per year        Hgb A1c completed on 01/30/20 Continue to follow up with your doctor for your annual wellness exam, follow up visits, and have lab work completed as recommended.      .  Visit Primary Care Provider or Endocrinologist at least 2 times per year         Primary care provider visit:  10/31/19, 12/12/19, 01/30/20 Client reports her next annual wellness visit is 06/18/20        Plan:  Send successful outreach letter with a copy of their individualized care plan, Send individual care plan to provider and Send educational material  Chronic care management coordinator will outreach in:  12 months  Will refer to:  Pharmacy  RN case manager will send client Advance Directive packet as discussed.     Quinn Plowman RN,BSN,CCM Chronic Care Management Coordinator [facility]  Management [phone]   .

## 2020-04-16 ENCOUNTER — Ambulatory Visit
Admission: RE | Admit: 2020-04-16 | Discharge: 2020-04-16 | Disposition: A | Discharge status: Home / Self Care | Payer: PPO | Source: Ambulatory Visit | Attending: Family Medicine | Admitting: Family Medicine

## 2020-04-16 ENCOUNTER — Ambulatory Visit: Payer: Self-pay

## 2020-04-16 ENCOUNTER — Other Ambulatory Visit: Payer: Self-pay

## 2020-04-16 DIAGNOSIS — Z1231 Encounter for screening mammogram for malignant neoplasm of breast: Secondary | ICD-10-CM

## 2020-05-01 ENCOUNTER — Encounter: Payer: Self-pay | Admitting: Podiatry

## 2020-05-01 ENCOUNTER — Other Ambulatory Visit: Payer: Self-pay

## 2020-05-01 ENCOUNTER — Ambulatory Visit: Payer: HMO | Admitting: Podiatry

## 2020-05-01 DIAGNOSIS — R252 Cramp and spasm: Secondary | ICD-10-CM

## 2020-05-01 DIAGNOSIS — M722 Plantar fascial fibromatosis: Secondary | ICD-10-CM

## 2020-05-01 MED ORDER — CYCLOBENZAPRINE HCL 5 MG PO TABS: 5.0000 mg | ORAL_TABLET | Freq: Every day | ORAL | 1 refills | Status: DC

## 2020-05-02 DIAGNOSIS — M130 Polyarthritis, unspecified: Secondary | ICD-10-CM | POA: Diagnosis not present

## 2020-05-02 DIAGNOSIS — Z0000 Encounter for general adult medical examination without abnormal findings: Secondary | ICD-10-CM | POA: Diagnosis not present

## 2020-05-02 DIAGNOSIS — E896 Postprocedural adrenocortical (-medullary) hypofunction: Secondary | ICD-10-CM | POA: Diagnosis not present

## 2020-05-02 DIAGNOSIS — E1169 Type 2 diabetes mellitus with other specified complication: Secondary | ICD-10-CM | POA: Diagnosis not present

## 2020-05-02 DIAGNOSIS — G4730 Sleep apnea, unspecified: Secondary | ICD-10-CM | POA: Diagnosis not present

## 2020-05-02 DIAGNOSIS — E785 Hyperlipidemia, unspecified: Secondary | ICD-10-CM | POA: Diagnosis not present

## 2020-05-02 DIAGNOSIS — F064 Anxiety disorder due to known physiological condition: Secondary | ICD-10-CM | POA: Diagnosis not present

## 2020-05-02 NOTE — Progress Notes (Signed)
She presents today for follow-up of her bilateral plantar fasciitis states that the heels are doing much better but she is starting to have spasms at night.  She states that goes all the way up to my knee.  She states that there is she wants to know if there is something we can do about this.  Objective: Vital signs are stable alert and oriented x3.  Pulses are palpable.  She has very little in the way of pain on palpation of the of the bilateral heels.  Assessment: Resolving plan fasciitis muscle spasms and cramps in her legs.  Plan: At this point will start her on cyclobenzaprine 5 mg twice a day we also discussed over-the-counter remedies such as Thera works she understands this and will follow up with me in 6 weeks or so.

## 2020-05-04 DIAGNOSIS — E119 Type 2 diabetes mellitus without complications: Secondary | ICD-10-CM | POA: Diagnosis not present

## 2020-05-04 DIAGNOSIS — E7801 Familial hypercholesterolemia: Secondary | ICD-10-CM | POA: Diagnosis not present

## 2020-05-04 DIAGNOSIS — I10 Essential (primary) hypertension: Secondary | ICD-10-CM | POA: Diagnosis not present

## 2020-05-09 ENCOUNTER — Encounter: Payer: Self-pay | Admitting: Orthopaedic Surgery

## 2020-05-09 ENCOUNTER — Ambulatory Visit: Payer: HMO | Admitting: Orthopaedic Surgery

## 2020-05-09 ENCOUNTER — Ambulatory Visit (INDEPENDENT_AMBULATORY_CARE_PROVIDER_SITE_OTHER): Payer: HMO

## 2020-05-09 VITALS — Ht 64.0 in | Wt 228.0 lb

## 2020-05-09 DIAGNOSIS — M79605 Pain in left leg: Secondary | ICD-10-CM | POA: Diagnosis not present

## 2020-05-09 DIAGNOSIS — M898X5 Other specified disorders of bone, thigh: Secondary | ICD-10-CM

## 2020-05-09 DIAGNOSIS — M79651 Pain in right thigh: Secondary | ICD-10-CM

## 2020-05-09 DIAGNOSIS — M79604 Pain in right leg: Secondary | ICD-10-CM

## 2020-05-09 NOTE — Addendum Note (Signed)
Addended by: Lendon Collar on: 05/09/2020 01:57 PM   Modules accepted: Orders

## 2020-05-09 NOTE — Addendum Note (Signed)
Addended by: Lendon Collar on: 05/09/2020 02:15 PM   Modules accepted: Orders

## 2020-05-09 NOTE — Addendum Note (Signed)
Addended by: Lendon Collar on: 05/09/2020 02:04 PM   Modules accepted: Orders

## 2020-05-09 NOTE — Progress Notes (Addendum)
Office Visit Note   Patient: Cathy Gonzalez           Date of Birth: 10/13/1945           MRN: [id number] Visit Date: 05/09/2020              Requested by: Cathy Gonzalez, [address] [place],  [place] [address] PCP: Cathy Lei, MD   Assessment & Plan: Visit Diagnoses:  1. Pain in right femur   2. Right thigh pain     Plan: Cathy Gonzalez relates a 1 to 2-week history of right thigh pain.  She is not had aches back pain or buttock discomfort and she is really not having much groin pain.  She has chronic arthritis of her right knee but notes that this is separate.  No injury or trauma.  She has difficulty standing and walking.  She has lost about 30 pounds and her present BMI is about 39.  She also notes that her hemoglobin A1c was below 5 recently.  She is diabetic. she does have diabetic peripheral neuropathy.  Not sure the exact etiology.  Will obtain MRI scan of right thigh.  Had arterial Doppler studies in October 2020 that revealed no significant arterial disease in her right lower extremity Follow-Up Instructions: Return Arterial Doppler studies.   Orders:  Orders Placed This Encounter  Procedures  . XR FEMUR, MIN 2 VIEWS RIGHT   No orders of the defined types were placed in this encounter.     Procedures: No procedures performed   Clinical Data: No additional findings.   Subjective: Chief Complaint  Patient presents with  . Right Hip - Pain  Patient presents today for right hip pain. She has been hurting for a week now. No known injury. Her pain is located in her "thigh" and groin. She states that it travels down her leg and into her toes. She does have some buttock pain. She has taken tylenol but states that it does not help. A heating pad helps with the pain. No numbness or tingling in her legs. She said that it feels like a muscle pain.  Does have diabetes.  Recent hemoglobin A1c was "below 5".  Does have burning in both of her feet possibly consistent with  neuropathy.  She also has lost about 30 pounds recently.  Has a previously diagnosed arthritis of her right knee.  She had films of her hip that were recently performed which I reviewed.  That these demonstrate mild degenerative changes of the right hip.  HPI  Review of Systems   Objective: Vital Signs: Ht 5\' 4"  (1.626 m)   Wt 228 lb (103.4 kg)   BMI 39.14 kg/m   Physical Exam Constitutional:      Appearance: She is well-developed.  Eyes:     Pupils: Pupils are equal, round, and reactive to light.  Pulmonary:     Effort: Pulmonary effort is normal.  Skin:    General: Skin is warm and dry.  Neurological:     Mental Status: She is alert and oriented to person, place, and time.  Psychiatric:        Behavior: Behavior normal.     Ortho Exam awake and alert.  No acute distress.  Very minimal loss of internal/external rotation of her right hip compared to the left and not much pain.  Has palpable pain about her right thigh but no masses.  Large legs.  Some crepitation with patella motion  and some medial lateral right knee pain consistent with her arthritis.  Does have venous stasis changes in both lower extremities and some mild pitting edema.  Very minimal pulses.  Straight leg raise negative.  No percussible tenderness of lumbar spine  Specialty Comments:  No specialty comments available.  Imaging: XR FEMUR, MIN 2 VIEWS RIGHT  Result Date: 05/09/2020 Films of the entire right femur obtained in multiple projections.  No obvious bony abnormality.  There are degenerative changes in the knee as previously identified and to a lesser extent the right hip    PMFS History: Patient Active Problem List   Diagnosis Date Noted  . Right thigh pain 05/09/2020  . Low back pain 07/14/2019  . OSA on CPAP 05/16/2019  . Diabetic neuropathy associated with type 2 diabetes mellitus ([place]) 12/13/2018  . Chronic pain of right knee 12/09/2018  . Thumb pain, left 12/09/2018  . Dizziness and  giddiness 01/14/2016  . Hepatic cirrhosis ([place]) 07/26/2015  . Nocturnal seizures ([place]) 05/14/2015  . Chronic hepatitis C without hepatic coma ([place]) 03/14/2015  . Vertigo, central 02/11/2014  . Difficulty walking 02/11/2014  . Diabetes mellitus ([place]) 02/11/2014  . Dysphagia 02/11/2014  . OBESITY 07/26/2010  . Essential hypertension, benign 07/26/2010  . DEGENERATIVE JOINT DISEASE, GENERALIZED 07/26/2010   Past Medical History:  Diagnosis Date  . Arthritis   . Cataract    left cataract removed in 2017  . Diabetes mellitus   . Diabetic neuropathy ([place])   . Diabetic neuropathy, type II diabetes mellitus ([place])   . Hepatitis C    took tx for   . Hypertension   . Hypokalemia   . Knee pain    right   . Morbidly obese ([place])   . Nocturnal seizures ([place]) 05/14/2015   no bad seizure since 1980's  . OSA on CPAP    uses cpap 2-3 week  . Plantar fasciitis    both feet    Family History  Problem Relation Age of Onset  . Diabetes Mother   . Heart attack Mother   . Diabetes Sister   . Diabetes Brother   . Cancer Sister        breast  . Breast cancer Sister   . Seizures Neg Hx     Past Surgical History:  Procedure Laterality Date  . ABDOMINAL HYSTERECTOMY     partial  . CATARACT EXTRACTION Left   . COLONOSCOPY WITH PROPOFOL N/A 08/21/2015   Procedure: COLONOSCOPY WITH PROPOFOL;  Surgeon: Carol Ada, MD;  Location: [facility];  Service: Endoscopy;  Laterality: N/A;  . ESOPHAGOGASTRODUODENOSCOPY (EGD) WITH PROPOFOL N/A 08/21/2015   Procedure: ESOPHAGOGASTRODUODENOSCOPY (EGD) WITH PROPOFOL;  Surgeon: Carol Ada, MD;  [facility];  Service: Endoscopy;  Laterality: N/A;  . ESOPHAGOGASTRODUODENOSCOPY (EGD) WITH PROPOFOL N/A 10/08/2018   Procedure: ESOPHAGOGASTRODUODENOSCOPY (EGD) WITH PROPOFOL;  Surgeon: Carol Ada, MD;  [facility];  Service: Endoscopy;  Laterality: N/A;  . EYE SURGERY Bilateral    cataracts   Social History   Occupational History  .  Occupation: [employer]  Tobacco Use  . Smoking status: Never Smoker  . Smokeless tobacco: Never Used  Vaping Use  . Vaping Use: Never used  Substance and Sexual Activity  . Alcohol use: No    Alcohol/week: 0.0 standard drinks  . Drug use: No  . Sexual activity: Never

## 2020-05-21 ENCOUNTER — Ambulatory Visit: Payer: PPO | Admitting: Family Medicine

## 2020-05-22 ENCOUNTER — Telehealth: Payer: Self-pay | Admitting: *Deleted

## 2020-05-22 NOTE — Telephone Encounter (Signed)
Spoke to pt and she had issues with cost of supplies.  She did not need new order for supplies.  She wants to know if this is something that she will need for the rest of her life.  I state most likely, but some pt lose weight and have repeat SS and do not need it.  I made appt with JM/NP for 06-25-20 at 1315. (offered VV but she declined).  Sent her message with appt on mychart.

## 2020-05-22 NOTE — Telephone Encounter (Signed)
[name], RN; Charmian Muff Hey there!   Looks like all we need is a new RX for supplies. We have notes from 01/2020.   Please send RX and we will get her taken care of.   Thanks!  Christina     Previous Messages   ----- Message -----  From: Brandon Melnick, RN  Sent: 05/21/2020  4:48 PM EDT  To: [name], Charmian Muff  Subject: cpap                       Cathy Gonzalez  Female, 74 y.o., [birth year]  Does this pt need new cpap orders? Or is she able to get some if needed. Was having issue with leaking last visit 12-2019? Newman Pies

## 2020-05-22 NOTE — Telephone Encounter (Signed)
Cathy Gonzalez  Mychart, Generic 5 days ago  She said I had not slept on my Cpac machine enough hour so she cancel my appointment. I told her I had to to order new part I don't have the money to buy the part for the machine. She didn't offer me an other appointment at a later date or nothing. My.primary doctor ask me to get back on the machine.

## 2020-05-22 NOTE — Telephone Encounter (Signed)
Please let her know that I have reviewed her CPAP compliance report.  I understand that compliance has been a concern for her. Complaince report shows she has used CPAP 9/30 days. I am happy to send an order to DME for supplies. Please remind her that insurance requires a minimum of 70% compliance. She needs to reschedule follow up to review compliance report in 30-45 days. TY.

## 2020-05-22 NOTE — Telephone Encounter (Signed)
Cathy Gonzalez I relayed your message below patient said she has received her new supplies 05/18/2020 and she has been using every night since the 05/18/2020. Patient states she needs apt because she is doing what she needs to do . [id number].  Patient states she needs apt after 1:00 today .

## 2020-05-22 NOTE — Addendum Note (Signed)
Addended by: Quinn Plowman E on: 05/22/2020 12:18 PM   Modules accepted: Orders

## 2020-05-23 NOTE — Patient Outreach (Signed)
Received a referral for Pharmacy assistance for [place], sent this referral to [facility] through the [employer] portal.   Ticket saved successfully with Tracking ID: [id number]

## 2020-05-28 ENCOUNTER — Ambulatory Visit: Payer: HMO | Admitting: Podiatry

## 2020-06-04 ENCOUNTER — Ambulatory Visit
Admission: RE | Admit: 2020-06-04 | Discharge: 2020-06-04 | Disposition: A | Discharge status: Home / Self Care | Payer: HMO | Source: Ambulatory Visit | Attending: Orthopaedic Surgery | Admitting: Orthopaedic Surgery

## 2020-06-04 ENCOUNTER — Other Ambulatory Visit: Payer: Self-pay

## 2020-06-04 ENCOUNTER — Ambulatory Visit: Payer: HMO | Admitting: Podiatry

## 2020-06-04 DIAGNOSIS — R600 Localized edema: Secondary | ICD-10-CM | POA: Diagnosis not present

## 2020-06-04 DIAGNOSIS — M1611 Unilateral primary osteoarthritis, right hip: Secondary | ICD-10-CM | POA: Diagnosis not present

## 2020-06-04 DIAGNOSIS — M898X5 Other specified disorders of bone, thigh: Secondary | ICD-10-CM

## 2020-06-04 DIAGNOSIS — M79651 Pain in right thigh: Secondary | ICD-10-CM | POA: Diagnosis not present

## 2020-06-05 DIAGNOSIS — I10 Essential (primary) hypertension: Secondary | ICD-10-CM | POA: Diagnosis not present

## 2020-06-05 DIAGNOSIS — E785 Hyperlipidemia, unspecified: Secondary | ICD-10-CM | POA: Diagnosis not present

## 2020-06-05 DIAGNOSIS — E118 Type 2 diabetes mellitus with unspecified complications: Secondary | ICD-10-CM | POA: Diagnosis not present

## 2020-06-07 ENCOUNTER — Ambulatory Visit (INDEPENDENT_AMBULATORY_CARE_PROVIDER_SITE_OTHER): Payer: HMO | Admitting: Orthopaedic Surgery

## 2020-06-07 ENCOUNTER — Other Ambulatory Visit: Payer: Self-pay

## 2020-06-07 ENCOUNTER — Encounter: Payer: Self-pay | Admitting: Orthopaedic Surgery

## 2020-06-07 VITALS — Ht 64.0 in | Wt 228.0 lb

## 2020-06-07 DIAGNOSIS — M79651 Pain in right thigh: Secondary | ICD-10-CM

## 2020-06-07 DIAGNOSIS — M160 Bilateral primary osteoarthritis of hip: Secondary | ICD-10-CM | POA: Diagnosis not present

## 2020-06-07 NOTE — Progress Notes (Signed)
Office Visit Note   Patient: Cathy Gonzalez           Date of Birth: [birth year]           MRN: [id number] Visit Date: 06/07/2020              Requested by: [name], [address] [place],  [place] [address] PCP: [name], MD   Assessment & Plan: Visit Diagnoses:  1. Right thigh pain   2. Bilateral primary osteoarthritis of hip     Plan: Mrs. Carp had an MRI scan of her right femur that demonstrated right greater than left hip osteoarthritis.  There were some subchondral cysts and narrowing of the right hip joint.  There was also some fluid over the greater trochanteric bursa.  It is hard for her to describe her pain as she is having some pain in her back in the area of her buttock, and groin thigh and knee.  Seems by exam that her biggest problem is with the arthritis of her hip.  From a diagnostic and therapeutic standpoint I will asked Dr. Ernestina Patches to inject her hip and then monitor her response.  We could consider injecting the greater trochanteric bursa or even obtaining an MRI scan of her lumbar spine should that be causing the referred pain into her right leg.  Prior films of her knee demonstrate degenerative changes in all 3 compartments  Follow-Up Instructions: Return 2 weeks after intra-articular cortisone injection right hip by Dr. Ernestina Patches.   Orders:  Orders Placed This Encounter  Procedures  . Ambulatory referral to Physical Medicine Rehab   No orders of the defined types were placed in this encounter.     Procedures: No procedures performed   Clinical Data: No additional findings.   Subjective: Chief Complaint  Patient presents with  . Right Leg - Follow-up    MRI review  Patient presents today for follow up on her right leg. She had an MRI of her right femur on 06/04/2020 and is here today for those results. History of diabetes with comorbidities including neuropathy.  Has an issue with her "kidneys" and is unable to take NSAIDs.  Also has history of  hepatitis C and has difficulty with Tylenol.  Had a chronic problem with her back with films demonstrating degenerative changes from L2-S1  HPI  Review of Systems   Objective: Vital Signs: Ht 5\' 4"  (1.626 m)   Wt 228 lb (103.4 kg)   BMI 39.14 kg/m   Physical Exam Constitutional:      Appearance: She is well-developed.  Eyes:     Pupils: Pupils are equal, round, and reactive to light.  Pulmonary:     Effort: Pulmonary effort is normal.  Skin:    General: Skin is warm and dry.  Neurological:     Mental Status: She is alert and oriented to person, place, and time.  Psychiatric:        Behavior: Behavior normal.     Ortho Exam awake alert and oriented x3.  Comfortable sitting.  Very large legs with a BMI of 39 does have some limitation of motion extreme of internal/external rotation of her right hip with some thigh and lateral hip pain.  Full extension of her right knee with maybe small effusion.  The knee was not hot warm or red.  Very minimal discomfort over the greater trochanter of her right hip.  Straight leg raise negative.  No percussible tenderness of the  lumbar spine.  No pain over either SI joint  Specialty Comments:  No specialty comments available.  Imaging: No results found.   PMFS History: Patient Active Problem List   Diagnosis Date Noted  . Bilateral primary osteoarthritis of hip 06/07/2020  . Right thigh pain 05/09/2020  . Low back pain 07/14/2019  . OSA on CPAP 05/16/2019  . Diabetic neuropathy associated with type 2 diabetes mellitus ([place]) 12/13/2018  . Chronic pain of right knee 12/09/2018  . Thumb pain, left 12/09/2018  . Dizziness and giddiness 01/14/2016  . Hepatic cirrhosis ([place]) 07/26/2015  . Nocturnal seizures ([place]) 05/14/2015  . Chronic hepatitis C without hepatic coma ([place]) 03/14/2015  . Vertigo, central 02/11/2014  . Difficulty walking 02/11/2014  . Diabetes mellitus ([place]) 02/11/2014  . Dysphagia 02/11/2014  . OBESITY 07/26/2010  .  Essential hypertension, benign 07/26/2010  . DEGENERATIVE JOINT DISEASE, GENERALIZED 07/26/2010   Past Medical History:  Diagnosis Date  . Arthritis   . Cataract    left cataract removed in 2017  . Diabetes mellitus   . Diabetic neuropathy ([place])   . Diabetic neuropathy, type II diabetes mellitus ([place])   . Hepatitis C    took tx for   . Hypertension   . Hypokalemia   . Knee pain    right   . Morbidly obese ([place])   . Nocturnal seizures ([place]) 05/14/2015   no bad seizure since 1980's  . OSA on CPAP    uses cpap 2-3 week  . Plantar fasciitis    both feet    Family History  Problem Relation Age of Onset  . Diabetes Mother   . Heart attack Mother   . Diabetes Sister   . Diabetes Brother   . Cancer Sister        breast  . Breast cancer Sister   . Seizures Neg Hx     Past Surgical History:  Procedure Laterality Date  . ABDOMINAL HYSTERECTOMY     partial  . CATARACT EXTRACTION Left   . COLONOSCOPY WITH PROPOFOL N/A 08/21/2015   Procedure: COLONOSCOPY WITH PROPOFOL;  Surgeon: Carol Ada, MD;  [facility];  Service: Endoscopy;  Laterality: N/A;  . ESOPHAGOGASTRODUODENOSCOPY (EGD) WITH PROPOFOL N/A 08/21/2015   Procedure: ESOPHAGOGASTRODUODENOSCOPY (EGD) WITH PROPOFOL;  Surgeon: Carol Ada, MD;  [facility];  Service: Endoscopy;  Laterality: N/A;  . ESOPHAGOGASTRODUODENOSCOPY (EGD) WITH PROPOFOL N/A 10/08/2018   Procedure: ESOPHAGOGASTRODUODENOSCOPY (EGD) WITH PROPOFOL;  Surgeon: Carol Ada, MD;  [facility];  Service: Endoscopy;  Laterality: N/A;  . EYE SURGERY Bilateral    cataracts   Social History   Occupational History  . Occupation: [employer]  Tobacco Use  . Smoking status: Never Smoker  . Smokeless tobacco: Never Used  Vaping Use  . Vaping Use: Never used  Substance and Sexual Activity  . Alcohol use: No    Alcohol/week: 0.0 standard drinks  . Drug use: No  . Sexual activity: Never

## 2020-06-14 ENCOUNTER — Telehealth: Payer: Self-pay | Admitting: Orthopaedic Surgery

## 2020-06-14 NOTE — Telephone Encounter (Signed)
I don't recall fitting her with a brace, and the note does not mention it. Do you remember?

## 2020-06-14 NOTE — Telephone Encounter (Signed)
Haskell Flirt with [employer] called trying to verify wether we gave the pt a brace on 05/09/20 and told her it would be billed? Haskell Flirt states she will reach out to the pt to get some more info and then will CB to check with [place].

## 2020-06-14 NOTE — Telephone Encounter (Signed)
My notes do not reflect giving her a brace

## 2020-06-15 NOTE — Telephone Encounter (Signed)
Called and spoke with patient. Upon further research this brace was given to her on 02/15/2020 office visit. She states that she was personally billed for this, and not her insurance. I have left all patient's information with Thurmond But[facility]). I ask him to please check on this and handle.

## 2020-06-20 ENCOUNTER — Encounter: Payer: Self-pay | Admitting: Orthopaedic Surgery

## 2020-06-20 ENCOUNTER — Ambulatory Visit (INDEPENDENT_AMBULATORY_CARE_PROVIDER_SITE_OTHER): Payer: HMO

## 2020-06-20 ENCOUNTER — Ambulatory Visit: Payer: HMO | Admitting: Orthopaedic Surgery

## 2020-06-20 ENCOUNTER — Other Ambulatory Visit: Payer: Self-pay

## 2020-06-20 VITALS — Ht 64.0 in | Wt 228.0 lb

## 2020-06-20 DIAGNOSIS — M25562 Pain in left knee: Secondary | ICD-10-CM

## 2020-06-20 DIAGNOSIS — M170 Bilateral primary osteoarthritis of knee: Secondary | ICD-10-CM | POA: Insufficient documentation

## 2020-06-20 DIAGNOSIS — M1712 Unilateral primary osteoarthritis, left knee: Secondary | ICD-10-CM

## 2020-06-20 MED ORDER — LIDOCAINE HCL 1 % IJ SOLN
2.0000 mL | INTRAMUSCULAR | Status: AC | PRN
  Administered 2020-06-20: 2 mL

## 2020-06-20 MED ORDER — METHYLPREDNISOLONE ACETATE 40 MG/ML IJ SUSP
80.0000 mg | INTRAMUSCULAR | Status: AC | PRN
  Administered 2020-06-20: 80 mg via INTRA_ARTICULAR

## 2020-06-20 MED ORDER — BUPIVACAINE HCL 0.5 % IJ SOLN
2.0000 mL | INTRAMUSCULAR | Status: AC | PRN
  Administered 2020-06-20: 2 mL via INTRA_ARTICULAR

## 2020-06-20 NOTE — Progress Notes (Signed)
Office Visit Note   Patient: Cathy Gonzalez           Date of Birth: 02-23-1946           MRN: [id number] Visit Date: 06/20/2020              Requested by: [name], [address] [place],  [place] [address] PCP: [name], MD   Assessment & Plan: Visit Diagnoses:  1. Acute pain of left knee   2. Bilateral primary osteoarthritis of knee     Plan: Acute onset of left knee pain.  Patient has a history of gout.  Will aspirate the left knee and inject cortisone.  Send aspirate to the lab.  Reevaluate in several weeks.  Give her a note not to work for 3 days.  No evidence of any acute change on x-ray.  There is no ectopic calcification.  Advanced degeneration of all 3 compartments  Follow-Up Instructions: Return if symptoms worsen or fail to improve.   Orders:  Orders Placed This Encounter  Procedures  . Large Joint Inj: L knee  . XR KNEE 3 VIEW LEFT  . Synovial cell count + diff, w/ crystals   No orders of the defined types were placed in this encounter.     Procedures: Large Joint Inj: L knee on 06/20/2020 3:28 PM Indications: pain and diagnostic evaluation Details: 25 G 1.5 in needle, anteromedial approach  Arthrogram: No  Medications: 2 mL lidocaine 1 %; 2 mL bupivacaine 0.5 %; 80 mg methylPREDNISolone acetate 40 MG/ML Aspirate: 32 mL cloudy and yellow; sent for lab analysis Outcome: tolerated well, no immediate complications Procedure, treatment alternatives, risks and benefits explained, specific risks discussed. Consent was given by the patient. Patient was prepped and draped in the usual sterile fashion.       Clinical Data: No additional findings.   Subjective: Chief Complaint  Patient presents with  . Left Knee - Pain  Patient presents today for left knee pain. She states that her left knee "locked up" Sunday night, and was unable to bear weight on the left leg Monday. She has just started to ambulate with a walker today. She sates that her  knee hurts all throughout and into her calf. She said that she has swelling down to ankle. She has been taking Tylenol. She needs a doctors note for work.  Has a history of gout and takes "medicine".  HPI  Review of Systems   Objective: Vital Signs: Ht 5\' 4"  (1.626 m)   Wt 228 lb (103.4 kg)   BMI 39.14 kg/m   Physical Exam Constitutional:      Appearance: She is well-developed.  Eyes:     Pupils: Pupils are equal, round, and reactive to light.  Pulmonary:     Effort: Pulmonary effort is normal.  Skin:    General: Skin is warm and dry.  Neurological:     Mental Status: She is alert and oriented to person, place, and time.  Psychiatric:        Behavior: Behavior normal.     Ortho Exam left knee with effusion.  Slightly warm.  Multiple areas of tenderness.  No instability.  Lacked a few degrees to full extension and flexed about 90 degrees but was uncomfortable.  After aspiration and injection of cortisone with Xylocaine and Marcaine had much better range of motion and considerably less pain.  Specialty Comments:  No specialty comments available.  Imaging: XR KNEE 3 VIEW  LEFT  Result Date: 06/20/2020 Films of the left knee were obtained in 3 projections standing and compared to the films she had in her right knee previously.  There are tricompartmental degenerative changes with osteophyte formation and subchondral sclerosis.  Probable osteocartilaginous loose bodies in the popliteal space.  Films are consistent with advanced or end-stage osteoarthritis in all 3 compartments of the left knee    PMFS History: Patient Active Problem List   Diagnosis Date Noted  . Bilateral primary osteoarthritis of knee 06/20/2020  . Bilateral primary osteoarthritis of hip 06/07/2020  . Right thigh pain 05/09/2020  . Low back pain 07/14/2019  . OSA on CPAP 05/16/2019  . Diabetic neuropathy associated with type 2 diabetes mellitus ([place]) 12/13/2018  . Chronic pain of right knee 12/09/2018    . Thumb pain, left 12/09/2018  . Dizziness and giddiness 01/14/2016  . Hepatic cirrhosis ([place]) 07/26/2015  . Nocturnal seizures ([place]) 05/14/2015  . Chronic hepatitis C without hepatic coma ([place]) 03/14/2015  . Vertigo, central 02/11/2014  . Difficulty walking 02/11/2014  . Diabetes mellitus ([place]) 02/11/2014  . Dysphagia 02/11/2014  . OBESITY 07/26/2010  . Essential hypertension, benign 07/26/2010  . DEGENERATIVE JOINT DISEASE, GENERALIZED 07/26/2010   Past Medical History:  Diagnosis Date  . Arthritis   . Cataract    left cataract removed in 2017  . Diabetes mellitus   . Diabetic neuropathy ([place])   . Diabetic neuropathy, type II diabetes mellitus ([place])   . Hepatitis C    took tx for   . Hypertension   . Hypokalemia   . Knee pain    right   . Morbidly obese ([place])   . Nocturnal seizures ([place]) 05/14/2015   no bad seizure since 1980's  . OSA on CPAP    uses cpap 2-3 week  . Plantar fasciitis    both feet    Family History  Problem Relation Age of Onset  . Diabetes Mother   . Heart attack Mother   . Diabetes Sister   . Diabetes Brother   . Cancer Sister        breast  . Breast cancer Sister   . Seizures Neg Hx     Past Surgical History:  Procedure Laterality Date  . ABDOMINAL HYSTERECTOMY     partial  . CATARACT EXTRACTION Left   . COLONOSCOPY WITH PROPOFOL N/A 08/21/2015   Procedure: COLONOSCOPY WITH PROPOFOL;  Surgeon: Carol Ada, MD;  [facility];  Service: Endoscopy;  Laterality: N/A;  . ESOPHAGOGASTRODUODENOSCOPY (EGD) WITH PROPOFOL N/A 08/21/2015   Procedure: ESOPHAGOGASTRODUODENOSCOPY (EGD) WITH PROPOFOL;  Surgeon: Carol Ada, MD;  [facility];  Service: Endoscopy;  Laterality: N/A;  . ESOPHAGOGASTRODUODENOSCOPY (EGD) WITH PROPOFOL N/A 10/08/2018   Procedure: ESOPHAGOGASTRODUODENOSCOPY (EGD) WITH PROPOFOL;  Surgeon: Carol Ada, MD;  [facility];  Service: Endoscopy;  Laterality: N/A;  . EYE SURGERY Bilateral     cataracts   Social History   Occupational History  . Occupation: [employer]  Tobacco Use  . Smoking status: Never Smoker  . Smokeless tobacco: Never Used  Vaping Use  . Vaping Use: Never used  Substance and Sexual Activity  . Alcohol use: No    Alcohol/week: 0.0 standard drinks  . Drug use: No  . Sexual activity: Never

## 2020-06-21 LAB — SYNOVIAL CELL COUNT + DIFF, W/ CRYSTALS
Basophils, %: 0 %
Eosinophils-Synovial: 0 % (ref 0–2)
Lymphocytes-Synovial Fld: 17 % (ref 0–74)
Monocyte/Macrophage: 3 % (ref 0–69)
Neutrophil, Synovial: 80 % — ABNORMAL HIGH (ref 0–24)
Synoviocytes, %: 0 % (ref 0–15)
WBC, Synovial: 2470 cells/uL — ABNORMAL HIGH (ref <150)

## 2020-06-25 ENCOUNTER — Ambulatory Visit: Payer: Self-pay | Admitting: Adult Health

## 2020-06-26 ENCOUNTER — Ambulatory Visit: Payer: HMO | Admitting: Physical Medicine and Rehabilitation

## 2020-06-26 ENCOUNTER — Other Ambulatory Visit: Payer: Self-pay

## 2020-06-26 ENCOUNTER — Encounter: Payer: Self-pay | Admitting: Physical Medicine and Rehabilitation

## 2020-06-26 ENCOUNTER — Ambulatory Visit: Payer: Self-pay

## 2020-06-26 DIAGNOSIS — M25551 Pain in right hip: Secondary | ICD-10-CM

## 2020-06-26 NOTE — Progress Notes (Signed)
Pt state right hip pain. Pt state sitting and turning over at night makes the pain worse. Pt state Pain gel helps ease sum of the pain.   Numeric Pain Rating Scale and Functional Assessment Average Pain 5   In the last MONTH (on 0-10 scale) has pain interfered with the following?  1. General activity like being  able to carry out your everyday physical activities such as walking, climbing stairs, carrying groceries, or moving a chair?  Rating(7)

## 2020-06-26 NOTE — Progress Notes (Signed)
Cathy Gonzalez - 74 y.o. female MRN [id number]  Date of birth: Nov 05, 1945  Office Visit Note: Visit Date: 06/26/2020 PCP: [name], MD Referred by: [name], MD  Subjective: Chief Complaint  Patient presents with  . Right Hip - Pain   HPI: Cathy Gonzalez is a 74 y.o. female who comes in today at the request of Dr. Joni Fears for planned Right anesthetic hip arthrogram with fluoroscopic guidance.  The patient has failed conservative care including home exercise, medications, time and activity modification.  This injection will be diagnostic and hopefully therapeutic.  Please see requesting physician notes for further details and justification.   ROS Otherwise per HPI.  Assessment & Plan: Visit Diagnoses:  1. Pain in right hip     Plan: Findings:  Patient did have relief during the anesthetic phase of the injection.    Meds & Orders: No orders of the defined types were placed in this encounter.   Orders Placed This Encounter  Procedures  . Large Joint Inj: R hip joint  . XR C-ARM NO REPORT    Follow-up: Return for visit to requesting physician as needed.   Procedures: Large Joint Inj: R hip joint on 06/26/2020 1:12 PM Indications: pain and diagnostic evaluation Details: 22 G needle, anterior approach  Arthrogram: Yes  Medications: 4 mL bupivacaine 0.25 %; 60 mg triamcinolone acetonide 40 MG/ML Outcome: tolerated well, no immediate complications  Arthrogram demonstrated excellent flow of contrast throughout the joint surface without extravasation or obvious defect.  The patient had relief of symptoms during the anesthetic phase of the injection.  Procedure, treatment alternatives, risks and benefits explained, specific risks discussed. Consent was given by the patient. Immediately prior to procedure a time out was called to verify the correct patient, procedure, equipment, support staff and site/side marked as required. Patient was prepped and draped in the usual  sterile fashion.      No notes on file   Clinical History: Lumbar spine MRI  12/28/2016  L3-L4: Central and rightward protrusion. Foraminal disc material extends into the extraforaminal compartment on the RIGHT. Posterior element hypertrophy. Moderate stenosis. RIGHT L3 and BILATERAL L4 nerve root impingement.   L4-L5: Central protrusion. Advanced posterior element hypertrophy. Moderate to severe stenosis. Foraminal and extraforaminal disc material to the LEFT. LEFT greater than RIGHT L4 and L5 nerve root impingement.   L5-S1: Central extrusion. Posterior element hypertrophy. Disc material extends into both foramina. Mild stenosis. BILATERAL L5 and S1 nerve root impingement.   Compared with 2012, there is worsening at all levels except L1-2, which appears improved since that time.   IMPRESSION: Advanced multilevel spondylosis, most pronounced from L2 through S1. Combination of disc herniations and posterior element hypertrophy results in potentially symptomatic neural impingement at L2-3, L3-4, L4-5, and L5-S1.     She reports that she has never smoked. She has never used smokeless tobacco. No results for input(s): HGBA1C, LABURIC in the last 8760 hours.  Objective:  VS:  HT:    WT:   BMI:     BP:   HR: bpm  TEMP: ( )  RESP:  Physical Exam  Ortho Exam  Imaging: No results found.  Past Medical/Family/Surgical/Social History: Medications & Allergies reviewed per EMR, new medications updated. Patient Active Problem List   Diagnosis Date Noted  . Bilateral primary osteoarthritis of knee 06/20/2020  . Bilateral primary osteoarthritis of hip 06/07/2020  . Right thigh pain 05/09/2020  . Low back pain 07/14/2019  . OSA on CPAP  05/16/2019  . Diabetic neuropathy associated with type 2 diabetes mellitus ([place]) 12/13/2018  . Chronic pain of right knee 12/09/2018  . Thumb pain, left 12/09/2018  . Dizziness and giddiness 01/14/2016  . Hepatic cirrhosis ([place]) 07/26/2015    . Nocturnal seizures ([place]) 05/14/2015  . Chronic hepatitis C without hepatic coma ([place]) 03/14/2015  . Vertigo, central 02/11/2014  . Difficulty walking 02/11/2014  . Diabetes mellitus ([place]) 02/11/2014  . Dysphagia 02/11/2014  . OBESITY 07/26/2010  . Essential hypertension, benign 07/26/2010  . DEGENERATIVE JOINT DISEASE, GENERALIZED 07/26/2010   Past Medical History:  Diagnosis Date  . Arthritis   . Cataract    left cataract removed in 2017  . Diabetes mellitus   . Diabetic neuropathy ([place])   . Diabetic neuropathy, type II diabetes mellitus ([place])   . Hepatitis C    took tx for   . Hypertension   . Hypokalemia   . Knee pain    right   . Morbidly obese ([place])   . Nocturnal seizures ([place]) 05/14/2015   no bad seizure since 1980's  . OSA on CPAP    uses cpap 2-3 week  . Plantar fasciitis    both feet   Family History  Problem Relation Age of Onset  . Diabetes Mother   . Heart attack Mother   . Diabetes Sister   . Diabetes Brother   . Cancer Sister        breast  . Breast cancer Sister   . Seizures Neg Hx    Past Surgical History:  Procedure Laterality Date  . ABDOMINAL HYSTERECTOMY     partial  . CATARACT EXTRACTION Left   . COLONOSCOPY WITH PROPOFOL N/A 08/21/2015   Procedure: COLONOSCOPY WITH PROPOFOL;  Surgeon: Carol Ada, MD;  Location: [facility];  Service: Endoscopy;  Laterality: N/A;  . ESOPHAGOGASTRODUODENOSCOPY (EGD) WITH PROPOFOL N/A 08/21/2015   Procedure: ESOPHAGOGASTRODUODENOSCOPY (EGD) WITH PROPOFOL;  Surgeon: Carol Ada, MD;  Location: [facility];  Service: Endoscopy;  Laterality: N/A;  . ESOPHAGOGASTRODUODENOSCOPY (EGD) WITH PROPOFOL N/A 10/08/2018   Procedure: ESOPHAGOGASTRODUODENOSCOPY (EGD) WITH PROPOFOL;  Surgeon: Carol Ada, MD;  Location: [facility];  Service: Endoscopy;  Laterality: N/A;  . EYE SURGERY Bilateral    cataracts   Social History   Occupational History  . Occupation: [employer]  Tobacco Use  . Smoking status: Never  Smoker  . Smokeless tobacco: Never Used  Vaping Use  . Vaping Use: Never used  Substance and Sexual Activity  . Alcohol use: No    Alcohol/week: 0.0 standard drinks  . Drug use: No  . Sexual activity: Never

## 2020-07-02 MED ORDER — BUPIVACAINE HCL 0.25 % IJ SOLN
4.0000 mL | INTRAMUSCULAR | Status: AC | PRN
  Administered 2020-06-26: 4 mL via INTRA_ARTICULAR

## 2020-07-02 MED ORDER — TRIAMCINOLONE ACETONIDE 40 MG/ML IJ SUSP
60.0000 mg | INTRAMUSCULAR | Status: AC | PRN
  Administered 2020-06-26: 60 mg via INTRA_ARTICULAR

## 2020-07-03 ENCOUNTER — Encounter: Payer: Self-pay | Admitting: Adult Health

## 2020-07-03 ENCOUNTER — Ambulatory Visit: Payer: HMO | Admitting: Podiatry

## 2020-07-03 ENCOUNTER — Other Ambulatory Visit: Payer: Self-pay

## 2020-07-03 ENCOUNTER — Ambulatory Visit: Payer: HMO | Admitting: Adult Health

## 2020-07-03 VITALS — BP 153/76 | HR 84 | Ht 64.0 in | Wt 238.0 lb

## 2020-07-03 DIAGNOSIS — G4733 Obstructive sleep apnea (adult) (pediatric): Secondary | ICD-10-CM | POA: Diagnosis not present

## 2020-07-03 DIAGNOSIS — Z9989 Dependence on other enabling machines and devices: Secondary | ICD-10-CM

## 2020-07-03 NOTE — Patient Instructions (Addendum)
Your Plan:  Continue current settings with your CPAP machine and highly recommend nightly use to help lower stroke and heart disease risk  A new order to keep receiving supplies and new facemask will be sent to [facility]   Follow-up in 1 year or call earlier if needed     Thank you for coming to see [place] at [facility]. I hope we have been able to provide you high quality care today.  You may receive a patient satisfaction survey over the next few weeks. We would appreciate your feedback and comments so that we may continue to improve ourselves and the health of our patients.   Living With Sleep Apnea Sleep apnea is a condition in which breathing pauses or becomes shallow during sleep. Sleep apnea is most commonly caused by a collapsed or blocked airway. People with sleep apnea snore loudly and have times when they gasp and stop breathing for 10 seconds or more during sleep. This happens over and over during the night. This disrupts your sleep and keeps your body from getting the rest that it needs, which can cause tiredness and lack of energy (fatigue) during the day. The breaks in breathing also interrupt the deep sleep that you need to feel rested. Even if you do not completely wake up from the gaps in breathing, your sleep may not be restful. You may also have a headache in the morning and low energy during the day, and you may feel anxious or depressed. How can sleep apnea affect me? Sleep apnea increases your chances of extreme tiredness during the day (daytime fatigue). It can also increase your risk for health conditions, such as:  Heart attack.  Stroke.  Diabetes.  Heart failure.  Irregular heartbeat.  High blood pressure. If you have daytime fatigue as a result of sleep apnea, you may be more likely to:  Perform poorly at school or work.  Fall asleep while driving.  Have difficulty with attention.  Develop depression or anxiety.  Become severely  overweight (obese).  Have sexual dysfunction. What actions can I take to manage sleep apnea? Sleep apnea treatment   If you were given a device to open your airway while you sleep, use it only as told by your health care provider. You may be given: ? An oral appliance. This is a custom-made mouthpiece that shifts your lower jaw forward. ? A continuous positive airway pressure (CPAP) device. This device blows air through a mask when you breathe out (exhale). ? A nasal expiratory positive airway pressure (EPAP) device. This device has valves that you put into each nostril. ? A bi-level positive airway pressure (BPAP) device. This device blows air through a mask when you breathe in (inhale) and breathe out (exhale).  You may need surgery if other treatments do not work for you. Sleep habits  Go to sleep and wake up at the same time every day. This helps set your internal clock (circadian rhythm) for sleeping. ? If you stay up later than usual, such as on weekends, try to get up in the morning within 2 hours of your normal wake time.  Try to get at least 7-9 hours of sleep each night.  Stop computer, tablet, and mobile phone use a few hours before bedtime.  Do not take long naps during the day. If you nap, limit it to 30 minutes.  Have a relaxing bedtime routine. Reading or listening to music may relax you and help you sleep.  Use  your bedroom only for sleep. ? Keep your television and computer out of your bedroom. ? Keep your bedroom cool, dark, and quiet. ? Use a supportive mattress and pillows.  Follow your health care provider's instructions for other changes to sleep habits. Nutrition  Do not eat heavy meals in the evening.  Do not have caffeine in the later part of the day. The effects of caffeine can last for more than 5 hours.  Follow your health care provider's or dietitian's instructions for any diet changes. Lifestyle      Do not drink alcohol before bedtime.  Alcohol can cause you to fall asleep at first, but then it can cause you to wake up in the middle of the night and have trouble getting back to sleep.  Do not use any products that contain nicotine or tobacco, such as cigarettes and e-cigarettes. If you need help quitting, ask your health care provider. Medicines  Take over-the-counter and prescription medicines only as told by your health care provider.  Do not use over-the-counter sleep medicine. You can become dependent on this medicine, and it can make sleep apnea worse.  Do not use medicines, such as sedatives and narcotics, unless told by your health care provider. Activity  Exercise on most days, but avoid exercising in the evening. Exercising near bedtime can interfere with sleeping.  If possible, spend time outside every day. Natural light helps regulate your circadian rhythm. General information  Lose weight if you need to, and maintain a healthy weight.  Keep all follow-up visits as told by your health care provider. This is important.  If you are having surgery, make sure to tell your health care provider that you have sleep apnea. You may need to bring your device with you. Where to find more information Learn more about sleep apnea and daytime fatigue from:  American Sleep Association: sleepassociation.[place]: sleepfoundation.org  National Heart, Lung, and Blood Institute: https://www.[place]man-[place].biz/ Summary  Sleep apnea can cause daytime fatigue and other serious health conditions.  Both sleep apnea and daytime fatigue can be bad for your health and well-being.  You may need to wear a device while sleeping to help keep your airway open.  If you are having surgery, make sure to tell your health care provider that you have sleep apnea. You may need to bring your device with you.  Making changes to sleep habits, diet, lifestyle, and activity can help you manage sleep apnea. This information is not  intended to replace advice given to you by your health care provider. Make sure you discuss any questions you have with your health care provider. Document Revised: 01/14/2019 Document Reviewed: 12/17/2017 Elsevier Patient Education  Cathy Gonzalez.

## 2020-07-03 NOTE — Progress Notes (Signed)
PATIENT: Cathy Gonzalez DOB: 02/15/1946  REASON FOR VISIT: CPAP follow up HISTORY FROM: patient  Chief Complaint  Patient presents with  . Follow-up    rm 9  . Sleep Apnea    pt said she is having no new sx     HISTORY OF PRESENT ILLNESS: Today 07/03/20   Cathy Gonzalez is being seen for CPAP compliance visit  CPAP compliance report 06/02/2020 -07/01/2020 shows 22 out of 30 usage days with 21 days greater than 4 hours for 70% compliance.  Average usage 7 hours and 29 minutes.  Residual AHI 2.3.  Pressure in the 95th percentile 11.9 with min pressure 5 and max pressure 16 with a EPR level 3.  Leaks the 95 percentile 0.5.  She reports tolerating well but at times likes to take a "break" from using her mask as she does not want to have to rely on it.  She questions importance of nightly use of mask and if she will need to continue using for the rest of her life.  She does request a new prescription to obtain CPAP supplies.  No further concerns.     HISTORY: (Copied from Debbora Presto, NP note on 01/02/2020  Cathy Gonzalez is a 74 y.o. female here today for follow up of OSA on CPAP. She has done well with therapy.  She does note that she is having some difficulty getting her nasal pillow and the correct positioning at night.  She feels that this is improving with usage.  She has noted that she is sleeping better using CPAP therapy.  Compliance report dated 04/12/2019 through 05/11/2019 reveals that she is using CPAP 30 out of the last 30 days for compliance of 100%.  28 of those days she used CPAP for greater than 4 hours for compliance of 93%.  AHI was 2.1 on 5 to 16 cm of water.  EPR of 3.  There was a mild leak noted in the 95th percentile at 20.1.  HISTORY: (copied from Dr Dohmeier's note on 02/07/2019)  HPI:  Cathy Gonzalez is a 74 y.o. female patient  wth known sleep apnea , but she couldn't use CPAP in the past.  He son has noted her snoring much louder than he remembered, and has recently spent  time at her place.  Her original sleep study took place about 2012-13, ordered by Dr Criss Rosales. She had been diagnosed and ordered to use a CPAP from [facility], but "choked on the water ". She has had light sleep related seizures, the last in December 2019.  I could not find out remember to use CPAP the last time, but it seems that even when she used CPAP she will use it daily but only 2 or 3 times a week.  I made her aware that with that low compliance she would not be able to get the machine's cost covered, nor the supplies- equipment.  Chief complaint according to patient : "My son told me my snoring got worse- he visited from [place] In March -wants me to look into CPAP again".   Sleep and medical history: 'Nocturnal epilepsy" - treated by Dr Jannifer Franklin for seizures that only occur  at night- in sleep. She is  on Keppra and Lyrica. Morbid obesity- Gout, hyperlipidemia and DM - Insulin user, also on Metformin. DM related progressed Neuropathy and Osteoarthritis. Takes multiple antihypertensives and lasix.   Family sleep history: Mother with DM and heart disease. On insulin. She passed in 2019.  Father died when she was 44, brother has DM, sister has DM, and breast cancer,   Social history: no coffee, no iced tea, no sodas.  She worked night shifts, late shifts - now is on day shift. Part time,  4 adult children, 3 sons and one daughter are all healthy.   Sleep habits are as follows: Cathy Gonzalez reports that her breakfast time is usually around 5:30 AM, her lunchtime at 1 and her dinnertime at 5PM or as late as 6 PM.  She goes to bed early at 9 PM and she describes her bedroom as dark but she states is not cool she usually keeps it over 70 degrees. Her bedroom is not quiet but did not explicitly tell me what she meant by this.   She sleeps mostly on her side, sometimes she sleeps on 2 ,sometimes on 3 pillows.  She goes to the bathroom at least twice at night and states that she takes diuretics.   Her first  bathroom break is between midnight and 1 AM, her second between 3 and 4 AM , and she states that she does dream but not very often. She rises at 4.30 AM to go to work. She works part time.    REVIEW OF SYSTEMS: Out of a complete 14 system review of symptoms, the patient complains only of those listed in HPI and all other reviewed systems are negative.  Epworth sleepiness scale: 4  ALLERGIES: No Known Allergies  HOME MEDICATIONS: Outpatient Medications Prior to Visit  Medication Sig Dispense Refill  . acetaminophen (TYLENOL) 500 MG tablet Take 500 mg by mouth every 6 (six) hours as needed for moderate pain.    [name] amLODipine (NORVASC) 10 MG tablet Take 10 mg by mouth daily. Takes half    . Blood Glucose Monitoring Suppl (ONE TOUCH ULTRA 2) w/Device KIT 2 (two) times daily. as directed    . colchicine 0.6 MG tablet Take 0.6 mg by mouth as needed (gout).     . cyclobenzaprine (FLEXERIL) 5 MG tablet Take 1 tablet (5 mg total) by mouth at bedtime. 30 tablet 1  . diclofenac Sodium (VOLTAREN) 1 % GEL SMARTSIG:2 Inch(es) Topical 4 Times Daily    . ezetimibe (ZETIA) 10 MG tablet Take 10 mg by mouth at bedtime.     [name] FIASP FLEXTOUCH 100 UNIT/ML SOPN Inject 4-15 Units into the skin 3 (three) times daily with meals.   2  . furosemide (LASIX) 40 MG tablet Take 40 mg by mouth daily.     . insulin degludec (TRESIBA) 100 UNIT/ML SOPN FlexTouch Pen Inject 10 Units into the skin every evening.     . levETIRAcetam (KEPPRA) 750 MG tablet Take 2 tablets (1,500 mg total) by mouth 2 (two) times daily. 360 tablet 3  . metFORMIN (GLUCOPHAGE) 500 MG tablet Take 500 mg by mouth at bedtime.     . montelukast (SINGULAIR) 10 MG tablet Take 10 mg by mouth at bedtime.    . ONE TOUCH ULTRA TEST test strip   5  . ONETOUCH DELICA LANCETS 02O MISC   3  . Potassium Chloride ER 20 MEQ TBCR Take 1 tablet by mouth 2 (two) times daily.  6  . pregabalin (LYRICA) 300 MG capsule Take 1 capsule (300 mg total) by mouth 2 (two) times  daily. 60 capsule 0  . sucralfate (CARAFATE) 1 G tablet Take 1 g by mouth 2 (two) times daily.     [name] telmisartan (MICARDIS) 80 MG tablet Take 80  mg by mouth daily.   1   No facility-administered medications prior to visit.    PAST MEDICAL HISTORY: Past Medical History:  Diagnosis Date  . Arthritis   . Cataract    left cataract removed in 2017  . Diabetes mellitus   . Diabetic neuropathy ([place])   . Diabetic neuropathy, type II diabetes mellitus ([place])   . Hepatitis C    took tx for   . Hypertension   . Hypokalemia   . Knee pain    right   . Morbidly obese ([place])   . Nocturnal seizures ([place]) 05/14/2015   no bad seizure since 1980's  . OSA on CPAP    uses cpap 2-3 week  . Plantar fasciitis    both feet    PAST SURGICAL HISTORY: Past Surgical History:  Procedure Laterality Date  . ABDOMINAL HYSTERECTOMY     partial  . CATARACT EXTRACTION Left   . COLONOSCOPY WITH PROPOFOL N/A 08/21/2015   Procedure: COLONOSCOPY WITH PROPOFOL;  Surgeon: Carol Ada, MD;  Location: [facility];  Service: Endoscopy;  Laterality: N/A;  . ESOPHAGOGASTRODUODENOSCOPY (EGD) WITH PROPOFOL N/A 08/21/2015   Procedure: ESOPHAGOGASTRODUODENOSCOPY (EGD) WITH PROPOFOL;  Surgeon: Carol Ada, MD;  Location: [facility];  Service: Endoscopy;  Laterality: N/A;  . ESOPHAGOGASTRODUODENOSCOPY (EGD) WITH PROPOFOL N/A 10/08/2018   Procedure: ESOPHAGOGASTRODUODENOSCOPY (EGD) WITH PROPOFOL;  Surgeon: Carol Ada, MD;  [facility]: [facility];  Service: Endoscopy;  Laterality: N/A;  . EYE SURGERY Bilateral    cataracts    FAMILY HISTORY: Family History  Problem Relation Age of Onset  . Diabetes Mother   . Heart attack Mother   . Diabetes Sister   . Diabetes Brother   . Cancer Sister        breast  . Breast cancer Sister   . Seizures Neg Hx     SOCIAL HISTORY: Social History   Socioeconomic History  . Marital status: Single    Spouse name: Not on file  . Number of children: 4  . Years of  education: 77  . Highest education level: Not on file  Occupational History  . Occupation: [employer]  Tobacco Use  . Smoking status: Never Smoker  . Smokeless tobacco: Never Used  Vaping Use  . Vaping Use: Never used  Substance and Sexual Activity  . Alcohol use: No    Alcohol/week: 0.0 standard drinks  . Drug use: No  . Sexual activity: Never  Other Topics Concern  . Not on file  Social History Narrative   Patient occasionally drinks caffeine.   Patient is right handed.   Social Determinants of Health   Financial Resource Strain:   . Difficulty of Paying Living Expenses: Not on file  Food Insecurity: Food Insecurity Present  . Worried About Charity fundraiser in the Last Year: Sometimes true  . Ran Out of Food in the Last Year: Never true  Transportation Needs: No Transportation Needs  . Lack of Transportation (Medical): No  . Lack of Transportation (Non-Medical): No  Physical Activity:   . Days of Exercise per Week: Not on file  . Minutes of Exercise per Session: Not on file  Stress:   . Feeling of Stress : Not on file  Social Connections:   . Frequency of Communication with Friends and Family: Not on file  . Frequency of Social Gatherings with Friends and Family: Not on file  . Attends Religious Services: Not on file  . Active Member of Clubs or Organizations: Not on  file  . Attends Archivist Meetings: Not on file  . Marital Status: Not on file  Intimate Partner Violence:   . Fear of Current or Ex-Partner: Not on file  . Emotionally Abused: Not on file  . Physically Abused: Not on file  . Sexually Abused: Not on file      PHYSICAL EXAM  Vitals:   07/03/20 1404  BP: (!) 153/76  Pulse: 84  Weight: 238 lb (108 kg)  Height: 5' 4" (1.626 m)   Body mass index is 40.85 kg/m.  Generalized: Morbidly obese pleasant elderly African-American female in no acute distress  Cardiology: normal rate and rhythm, no murmur noted Respiratory: Clear to  auscultation bilaterally Mallampati 3+, neck circ 18.5 (prior 17.5") Neurological examination  Mentation: Alert oriented to time, place, history taking. Follows all commands speech and language fluent Cranial nerve II-XII: Pupils were equal round reactive to light. Extraocular movements were full, visual field were full on confrontational test. Facial sensation and strength were normal. Uvula tongue midline. Head turning and shoulder shrug  were normal and symmetric. Motor: The motor testing reveals 5 over 5 strength of all 4 extremities. Good symmetric motor tone is noted throughout.  Coordination: Cerebellar testing reveals good finger-nose-finger and heel-to-shin bilaterally.  Gait and station: Gait is normal.   DIAGNOSTIC DATA (LABS, IMAGING, TESTING) - I reviewed patient records, labs, notes, testing and imaging myself where available.  No flowsheet data found.   Lab Results  Component Value Date   WBC 7.3 02/16/2017   HGB 13.9 02/16/2017   HCT 41.7 02/16/2017   MCV 88.7 02/16/2017   PLT 230 02/16/2017      Component Value Date/Time   NA 137 02/16/2017 1325   NA 140 08/11/2016 0833   K 3.4 (L) 02/16/2017 1325   CL 98 (L) 02/16/2017 1325   CO2 26 02/16/2017 1325   GLUCOSE 230 (H) 02/16/2017 1325   BUN 24 (H) 02/16/2017 1325   BUN 23 08/11/2016 0833   CREATININE 1.21 (H) 02/16/2017 1325   CREATININE 1.08 (H) 10/20/2016 1549   CALCIUM 10.2 02/16/2017 1325   PROT 7.0 12/26/2019 0903   PROT 7.2 08/11/2016 0833   ALBUMIN 4.6 10/20/2016 1549   ALBUMIN 4.5 08/11/2016 0833   AST 42 (H) 12/26/2019 0903   ALT 42 (H) 12/26/2019 0903   ALKPHOS 45 10/20/2016 1549   BILITOT 0.3 12/26/2019 0903   BILITOT <0.2 08/11/2016 0833   GFRNONAA 45 (L) 02/16/2017 1325   GFRNONAA 53 (L) 10/20/2016 1549   GFRAA 52 (L) 02/16/2017 1325   GFRAA 61 10/20/2016 1549   Lab Results  Component Value Date   CHOL 216 (H) 02/11/2014   HDL 65 02/11/2014   LDLCALC 133 (H) 02/11/2014   TRIG 92  02/11/2014   CHOLHDL 3.3 02/11/2014   Lab Results  Component Value Date   HGBA1C 7.2 (H) 02/11/2014   No results found for: VITAMINB12 No results found for: TSH    ASSESSMENT AND PLAN 74 y.o. year old female  has a past medical history of Arthritis, Cataract, Diabetes mellitus, Diabetic neuropathy ([place]), Diabetic neuropathy, type II diabetes mellitus ([place]), Hepatitis C, Hypertension, Hypokalemia, Knee pain, Morbidly obese ([place]), Nocturnal seizures ([place]) (05/14/2015), OSA on CPAP, and Plantar fasciitis. here with     ICD-10-CM   1. OSA on CPAP  G47.33 For home use only DME continuous positive airway pressure (CPAP)   Z99.89     Recent download shows 70% compliance with residual AHI 2.3.  Discussed  importance of nightly use of CPAP to lower stroke risk and cardiovascular risk factors.  Discussed importance of weight loss.  She is willing to increase usage.  New order sent to DME company [employer] for ongoing supplies.  She will follow-up in 1 year or call earlier if needed.   Orders Placed This Encounter  Procedures  . For home use only DME continuous positive airway pressure (CPAP)    New mask and supplies needed    Order Specific Question:   Length of Need    Answer:   Lifetime    Order Specific Question:   Patient has OSA or probable OSA    Answer:   Yes    Order Specific Question:   Is the patient currently using CPAP in the home    Answer:   Yes    Order Specific Question:   Settings    Answer:   Other see comments    Order Specific Question:   CPAP supplies needed    Answer:   Mask, headgear, cushions, filters, heated tubing and water chamber     No orders of the defined types were placed in this encounter.    I spent 20 minutes of face-to-face and non-face-to-face time with patient.  This included previsit chart review, lab review, study review, order entry, electronic health record documentation, patient education and discussion regarding review of compliance report,  importance of nightly compliance, and answered all questions to patient satisfaction  Frann Rider, AGNP-BC  [facility] [address] [place], [place] [address]  Phone [phone] Fax [phone] Note: This document was prepared with digital dictation and possible smart phrase technology. Any transcriptional errors that result from this process are unintentional.

## 2020-07-04 NOTE — Progress Notes (Signed)
Order for cpap supplies sent to [facility] via community msg. Confirmation received that the order transmitted was successful.  

## 2020-07-05 DIAGNOSIS — E785 Hyperlipidemia, unspecified: Secondary | ICD-10-CM | POA: Diagnosis not present

## 2020-07-05 DIAGNOSIS — E118 Type 2 diabetes mellitus with unspecified complications: Secondary | ICD-10-CM | POA: Diagnosis not present

## 2020-07-05 DIAGNOSIS — I10 Essential (primary) hypertension: Secondary | ICD-10-CM | POA: Diagnosis not present

## 2020-07-09 ENCOUNTER — Ambulatory Visit
Admission: RE | Admit: 2020-07-09 | Discharge: 2020-07-09 | Disposition: A | Discharge status: Home / Self Care | Payer: PPO | Source: Ambulatory Visit | Attending: Internal Medicine | Admitting: Internal Medicine

## 2020-07-09 DIAGNOSIS — I10 Essential (primary) hypertension: Secondary | ICD-10-CM | POA: Diagnosis not present

## 2020-07-09 DIAGNOSIS — E118 Type 2 diabetes mellitus with unspecified complications: Secondary | ICD-10-CM | POA: Diagnosis not present

## 2020-07-09 DIAGNOSIS — B1920 Unspecified viral hepatitis C without hepatic coma: Secondary | ICD-10-CM | POA: Diagnosis not present

## 2020-07-09 DIAGNOSIS — B182 Chronic viral hepatitis C: Secondary | ICD-10-CM

## 2020-07-09 DIAGNOSIS — K7460 Unspecified cirrhosis of liver: Secondary | ICD-10-CM

## 2020-07-09 DIAGNOSIS — E785 Hyperlipidemia, unspecified: Secondary | ICD-10-CM | POA: Diagnosis not present

## 2020-07-11 DIAGNOSIS — F4320 Adjustment disorder, unspecified: Secondary | ICD-10-CM | POA: Diagnosis not present

## 2020-07-11 DIAGNOSIS — I739 Peripheral vascular disease, unspecified: Secondary | ICD-10-CM | POA: Diagnosis not present

## 2020-07-11 DIAGNOSIS — E7801 Familial hypercholesterolemia: Secondary | ICD-10-CM | POA: Diagnosis not present

## 2020-07-11 DIAGNOSIS — G4730 Sleep apnea, unspecified: Secondary | ICD-10-CM | POA: Diagnosis not present

## 2020-07-11 DIAGNOSIS — Z23 Encounter for immunization: Secondary | ICD-10-CM | POA: Diagnosis not present

## 2020-07-16 ENCOUNTER — Other Ambulatory Visit: Payer: Self-pay | Admitting: *Deleted

## 2020-07-16 DIAGNOSIS — R002 Palpitations: Secondary | ICD-10-CM

## 2020-07-17 ENCOUNTER — Ambulatory Visit: Payer: HMO | Admitting: Podiatry

## 2020-07-18 ENCOUNTER — Encounter: Payer: Self-pay | Admitting: Orthopaedic Surgery

## 2020-07-18 ENCOUNTER — Ambulatory Visit: Payer: HMO | Admitting: Orthopaedic Surgery

## 2020-07-18 ENCOUNTER — Other Ambulatory Visit: Payer: Self-pay

## 2020-07-18 ENCOUNTER — Ambulatory Visit: Payer: Self-pay

## 2020-07-18 VITALS — Ht 64.0 in | Wt 238.0 lb

## 2020-07-18 DIAGNOSIS — M79642 Pain in left hand: Secondary | ICD-10-CM | POA: Diagnosis not present

## 2020-07-18 DIAGNOSIS — M1810 Unilateral primary osteoarthritis of first carpometacarpal joint, unspecified hand: Secondary | ICD-10-CM | POA: Diagnosis not present

## 2020-07-18 MED ORDER — LIDOCAINE HCL 1 % IJ SOLN
1.0000 mL | INTRAMUSCULAR | Status: AC | PRN
  Administered 2020-07-18: 1 mL

## 2020-07-18 NOTE — Progress Notes (Signed)
Office Visit Note   Patient: Cathy Gonzalez           Date of Birth: Sep 11, 1946           MRN: [id number] Visit Date: 07/18/2020              Requested by: [name], [address] [place],  [place] [address] PCP: [name], MD   Assessment & Plan: Visit Diagnoses:  1. Pain in left hand   2. Arthritis of carpometacarpal (CMC) joint of thumb     Plan: Advanced osteoarthritis carpometacarpal joint left thumb with subluxation.  Long discussion regarding diagnosis and treatment options.  She is really had difficulty sleeping lately so I am going to inject the thumb with betamethasone and monitor response.  She had significant immediate relief  Follow-Up Instructions: Return if symptoms worsen or fail to improve.   Orders:  Orders Placed This Encounter  Procedures  . Hand/UE Inj: L thumb CMC  . XR Hand Complete Left   No orders of the defined types were placed in this encounter.     Procedures: Hand/UE Inj: L thumb CMC for osteoarthritis on 07/18/2020 2:25 PM Indications: pain Details: 27 G needle, dorsal approach Medications: 1 mL lidocaine 1 %  6 mg betamethasone      Clinical Data: No additional findings.   Subjective: Chief Complaint  Patient presents with  . Left Hand - Pain  Patient presents today for left hand pain. She said that it started to hurt on and off about two weeks ago. No known injury. Her pain is located at the base of her thumb and index finger. The pain is worse with any use of that hand. In the evenings after work the pain radiates to her elbow. She does not take anything for pain. She is right hand dominant. She also mentioned that her index finger will curl and lock up.   HPI  Review of Systems   Objective: Vital Signs: Ht 5\' 4"  (1.626 m)   Wt 238 lb (108 kg)   BMI 40.85 kg/m   Physical Exam Constitutional:      Appearance: She is well-developed.  Eyes:     Pupils: Pupils are equal, round, and reactive to light.    Pulmonary:     Effort: Pulmonary effort is normal.  Skin:    General: Skin is warm and dry.  Neurological:     Mental Status: She is alert and oriented to person, place, and time.  Psychiatric:        Behavior: Behavior normal.     Ortho Exam left thumb with positive grind test and pain at the base of the thumb carpometacarpal joint.  There is subluxation of the joint consistent with the arthritis noted by x-ray.  Motor and sensory exam intact.  Decreased grip.  Patient has had some triggering of the index finger but she could not actively trigger today.  There is a very small minimally painful nodule over the metacarpal phalangeal joint of the index  Specialty Comments:  No specialty comments available.  Imaging: XR Hand Complete Left  Result Date: 07/18/2020 Films of the left hand were obtained in several projections.  There is significant arthritis at the base of the thumb carpometacarpal joint.  There is at least 50% chronic subluxation with ectopic calcification and narrowing of the joint space.  There is also some arthritis involving the index carpometacarpal joint.  No acute changes    PMFS History:  Patient Active Problem List   Diagnosis Date Noted  . Arthritis of carpometacarpal (CMC) joint of thumb 07/18/2020  . Bilateral primary osteoarthritis of knee 06/20/2020  . Bilateral primary osteoarthritis of hip 06/07/2020  . Right thigh pain 05/09/2020  . Low back pain 07/14/2019  . OSA on CPAP 05/16/2019  . Diabetic neuropathy associated with type 2 diabetes mellitus ([place]) 12/13/2018  . Chronic pain of right knee 12/09/2018  . Thumb pain, left 12/09/2018  . Dizziness and giddiness 01/14/2016  . Hepatic cirrhosis ([place]) 07/26/2015  . Nocturnal seizures ([place]) 05/14/2015  . Chronic hepatitis C without hepatic coma ([place]) 03/14/2015  . Vertigo, central 02/11/2014  . Difficulty walking 02/11/2014  . Diabetes mellitus ([place]) 02/11/2014  . Dysphagia 02/11/2014  . OBESITY  07/26/2010  . Essential hypertension, benign 07/26/2010  . DEGENERATIVE JOINT DISEASE, GENERALIZED 07/26/2010   Past Medical History:  Diagnosis Date  . Arthritis   . Cataract    left cataract removed in 2017  . Diabetes mellitus   . Diabetic neuropathy ([place])   . Diabetic neuropathy, type II diabetes mellitus ([place])   . Hepatitis C    took tx for   . Hypertension   . Hypokalemia   . Knee pain    right   . Morbidly obese ([place])   . Nocturnal seizures ([place]) 05/14/2015   no bad seizure since 1980's  . OSA on CPAP    uses cpap 2-3 week  . Plantar fasciitis    both feet    Family History  Problem Relation Age of Onset  . Diabetes Mother   . Heart attack Mother   . Diabetes Sister   . Diabetes Brother   . Cancer Sister        breast  . Breast cancer Sister   . Seizures Neg Hx     Past Surgical History:  Procedure Laterality Date  . ABDOMINAL HYSTERECTOMY     partial  . CATARACT EXTRACTION Left   . COLONOSCOPY WITH PROPOFOL N/A 08/21/2015   Procedure: COLONOSCOPY WITH PROPOFOL;  Surgeon: Carol Ada, MD;  [facility];  Service: Endoscopy;  Laterality: N/A;  . ESOPHAGOGASTRODUODENOSCOPY (EGD) WITH PROPOFOL N/A 08/21/2015   Procedure: ESOPHAGOGASTRODUODENOSCOPY (EGD) WITH PROPOFOL;  Surgeon: Carol Ada, MD;  [facility];  Service: Endoscopy;  Laterality: N/A;  . ESOPHAGOGASTRODUODENOSCOPY (EGD) WITH PROPOFOL N/A 10/08/2018   Procedure: ESOPHAGOGASTRODUODENOSCOPY (EGD) WITH PROPOFOL;  Surgeon: Carol Ada, MD;  [facility];  Service: Endoscopy;  Laterality: N/A;  . EYE SURGERY Bilateral    cataracts   Social History   Occupational History  . Occupation: [employer]  Tobacco Use  . Smoking status: Never Smoker  . Smokeless tobacco: Never Used  Vaping Use  . Vaping Use: Never used  Substance and Sexual Activity  . Alcohol use: No    Alcohol/week: 0.0 standard drinks  . Drug use: No  . Sexual activity: Never

## 2020-07-27 DIAGNOSIS — G4733 Obstructive sleep apnea (adult) (pediatric): Secondary | ICD-10-CM | POA: Diagnosis not present

## 2020-07-30 ENCOUNTER — Ambulatory Visit: Payer: HMO | Admitting: Cardiovascular Disease

## 2020-07-31 ENCOUNTER — Ambulatory Visit (INDEPENDENT_AMBULATORY_CARE_PROVIDER_SITE_OTHER): Payer: HMO

## 2020-07-31 DIAGNOSIS — R002 Palpitations: Secondary | ICD-10-CM

## 2020-08-02 ENCOUNTER — Other Ambulatory Visit: Payer: Self-pay

## 2020-08-02 NOTE — Patient Outreach (Addendum)
  [facility]) Care Management Chronic Special Needs Program    08/02/2020  Name: Cathy Gonzalez, DOB: March 23, 1946  MRN: [id number]   Cathy Gonzalez is enrolled in a chronic special needs plan.  Member will be followed by the [facility] case management team beginning October 06, 2020.  Case is closed by [place] management, will reopen if needed before October 06, 2020.   Addendum: [place] will continue to follow client through 10/05/20.  Client will be followed by the [facility] case management team beginning October 06, 2020.   Quinn Plowman RN,BSN,CCM [facility] Management [phone]

## 2020-08-08 DIAGNOSIS — G629 Polyneuropathy, unspecified: Secondary | ICD-10-CM | POA: Diagnosis not present

## 2020-08-08 DIAGNOSIS — G40001 Localization-related (focal) (partial) idiopathic epilepsy and epileptic syndromes with seizures of localized onset, not intractable, with status epilepticus: Secondary | ICD-10-CM | POA: Diagnosis not present

## 2020-08-08 DIAGNOSIS — F4320 Adjustment disorder, unspecified: Secondary | ICD-10-CM | POA: Diagnosis not present

## 2020-08-08 DIAGNOSIS — E118 Type 2 diabetes mellitus with unspecified complications: Secondary | ICD-10-CM | POA: Diagnosis not present

## 2020-08-09 ENCOUNTER — Encounter: Payer: Self-pay | Admitting: Podiatry

## 2020-08-09 ENCOUNTER — Ambulatory Visit: Payer: HMO | Admitting: Podiatry

## 2020-08-09 ENCOUNTER — Other Ambulatory Visit: Payer: Self-pay

## 2020-08-09 DIAGNOSIS — M79675 Pain in left toe(s): Secondary | ICD-10-CM

## 2020-08-09 DIAGNOSIS — Q828 Other specified congenital malformations of skin: Secondary | ICD-10-CM | POA: Diagnosis not present

## 2020-08-09 DIAGNOSIS — B351 Tinea unguium: Secondary | ICD-10-CM

## 2020-08-09 DIAGNOSIS — M79674 Pain in right toe(s): Secondary | ICD-10-CM

## 2020-08-09 DIAGNOSIS — E1142 Type 2 diabetes mellitus with diabetic polyneuropathy: Secondary | ICD-10-CM | POA: Diagnosis not present

## 2020-09-04 DIAGNOSIS — G4733 Obstructive sleep apnea (adult) (pediatric): Secondary | ICD-10-CM | POA: Diagnosis not present

## 2020-09-09 NOTE — Progress Notes (Signed)
Cardiology Office Note:   Date:  09/10/2020  NAME:  Cathy Gonzalez    MRN: [id number] DOB:  06-Nov-1945   PCP:  [name], MD  Cardiologist:  No primary care provider on file.   Referring MD: [name], MD   Chief Complaint  Patient presents with  . Palpitations    History of Present Illness:   Cathy Gonzalez is a 74 y.o. female with a hx of obesity, OSA, DM, HTN, HLD who is being seen today for the evaluation of palpitations at the request of [name], MD.  She recently completed a monitor for palpitations.  I did review this in the office.  This shows PACs.  No atrial fibrillation.  She did have less than 1% supraventricular ectopy.  No atrial fibrillation documented.  She reports the last 2 months has had episodes of daily fluttering in her chest.  Can be worse with stress.  She reports also occurs at night.  Last seconds.  This does corroborate with the PACs seen on her monitor.  She is diabetic.  Most recent A1c 6.5.  She also endorses some lower extremity edema.  She can get short of breath with activity.  Apparently she was taken out at the gym due to the coronavirus pandemic.  She endorses no chest pain or chest tightness.  She is not returned back to the gym.  She plans to do this in the near future.  Weights appear to be stable.  She is not gaining weight.  She does have some lower extremity edema that is worse the other day.  No diagnosis of heart failure.  She is high risk given her comorbidities.  Her blood pressure appears to be well controlled today.  Most recent LDL cholesterol not at goal.  She is not on a statin.  Symptoms appear to be stable.  She does request a medication to help with the heart fluttering.  She also has sleep apnea.  Reports 100% compliance with her CPAP machine.  Problem List 1. Obesity 2. OSA 3. Diabetes -A1c 6.5 4. HTN 5. HLD -Total cholesterol 192, LDL 122, HDL 52, triglycerides 81  Past Medical History: Past Medical History:  Diagnosis Date    . Arthritis   . Cataract    left cataract removed in 2017  . Diabetes mellitus   . Diabetic neuropathy ([place])   . Diabetic neuropathy, type II diabetes mellitus ([place])   . Hepatitis C    took tx for   . Hypertension   . Hypokalemia   . Knee pain    right   . Morbidly obese ([place])   . Nocturnal seizures ([place]) 05/14/2015   no bad seizure since 1980's  . OSA on CPAP    uses cpap 2-3 week  . Plantar fasciitis    both feet    Past Surgical History: Past Surgical History:  Procedure Laterality Date  . ABDOMINAL HYSTERECTOMY     partial  . CATARACT EXTRACTION Left   . COLONOSCOPY WITH PROPOFOL N/A 08/21/2015   Procedure: COLONOSCOPY WITH PROPOFOL;  Surgeon: Carol Ada, MD;  Location: [facility];  Service: Endoscopy;  Laterality: N/A;  . ESOPHAGOGASTRODUODENOSCOPY (EGD) WITH PROPOFOL N/A 08/21/2015   Procedure: ESOPHAGOGASTRODUODENOSCOPY (EGD) WITH PROPOFOL;  Surgeon: Carol Ada, MD;  [facility];  Service: Endoscopy;  Laterality: N/A;  . ESOPHAGOGASTRODUODENOSCOPY (EGD) WITH PROPOFOL N/A 10/08/2018   Procedure: ESOPHAGOGASTRODUODENOSCOPY (EGD) WITH PROPOFOL;  Surgeon: Carol Ada, MD;  [facility];  Service: Endoscopy;  Laterality:  N/A;  . EYE SURGERY Bilateral    cataracts    Current Medications: Current Meds  Medication Sig  . acetaminophen (TYLENOL) 500 MG tablet Take 500 mg by mouth every 6 (six) hours as needed for moderate pain.  [name] amLODipine (NORVASC) 10 MG tablet Take 10 mg by mouth daily. Takes half  . Blood Glucose Monitoring Suppl (ONE TOUCH ULTRA 2) w/Device KIT 2 (two) times daily. as directed  . colchicine 0.6 MG tablet Take 0.6 mg by mouth as needed (gout).   [name] diclofenac Sodium (VOLTAREN) 1 % GEL SMARTSIG:2 Inch(es) Topical 4 Times Daily  . ezetimibe (ZETIA) 10 MG tablet Take 10 mg by mouth at bedtime.   [name] FIASP FLEXTOUCH 100 UNIT/ML SOPN Inject 4-15 Units into the skin 3 (three) times daily with meals.   . furosemide (LASIX) 40 MG  tablet Take 40 mg by mouth daily.   [name] levETIRAcetam (KEPPRA) 750 MG tablet Take 2 tablets (1,500 mg total) by mouth 2 (two) times daily.  . metFORMIN (GLUCOPHAGE) 500 MG tablet Take 500 mg by mouth at bedtime.   . montelukast (SINGULAIR) 10 MG tablet Take 10 mg by mouth at bedtime.  . ONE TOUCH ULTRA TEST test strip   . ONETOUCH DELICA LANCETS 47M MISC   . Potassium Chloride ER 20 MEQ TBCR Take 1 tablet by mouth 2 (two) times daily.  . pregabalin (LYRICA) 300 MG capsule Take 1 capsule (300 mg total) by mouth 2 (two) times daily.  . sucralfate (CARAFATE) 1 G tablet Take 1 g by mouth 2 (two) times daily.   [name] telmisartan (MICARDIS) 80 MG tablet Take 80 mg by mouth daily.      Allergies:    Patient has no known allergies.   Social History: Social History   Socioeconomic History  . Marital status: Single    Spouse name: Not on file  . Number of children: 4  . Years of education: 32  . Highest education level: Not on file  Occupational History  . Occupation: [employer]  Tobacco Use  . Smoking status: Never Smoker  . Smokeless tobacco: Never Used  Vaping Use  . Vaping Use: Never used  Substance and Sexual Activity  . Alcohol use: No    Alcohol/week: 0.0 standard drinks  . Drug use: No  . Sexual activity: Never  Other Topics Concern  . Not on file  Social History Narrative   Patient occasionally drinks caffeine.   Patient is right handed.   Social Determinants of Health   Financial Resource Strain:   . Difficulty of Paying Living Expenses: Not on file  Food Insecurity: Food Insecurity Present  . Worried About Charity fundraiser in the Last Year: Sometimes true  . Ran Out of Food in the Last Year: Never true  Transportation Needs: No Transportation Needs  . Lack of Transportation (Medical): No  . Lack of Transportation (Non-Medical): No  Physical Activity:   . Days of Exercise per Week: Not on file  . Minutes of Exercise per Session: Not on file  Stress:   . Feeling of  Stress : Not on file  Social Connections:   . Frequency of Communication with Friends and Family: Not on file  . Frequency of Social Gatherings with Friends and Family: Not on file  . Attends Religious Services: Not on file  . Active Member of Clubs or Organizations: Not on file  . Attends Archivist Meetings: Not on file  . Marital Status: Not on  file     Family History: The patient's family history includes Breast cancer in her sister; Cancer in her sister; Diabetes in her brother, mother, and sister; Heart attack in her mother. There is no history of Seizures.  ROS:   All other ROS reviewed and negative. Pertinent positives noted in the HPI.     EKGs/Labs/Other Studies Reviewed:   The following studies were personally reviewed by me today:  EKG:  EKG is ordered today.  The ekg ordered today demonstrates normal sinus rhythm, heart rate 78, low voltage, cannot exclude anterior infarct, and was personally reviewed by me.   Recent Labs: 12/26/2019: ALT 42   Recent Lipid Panel    Component Value Date/Time   CHOL 216 (H) 02/11/2014 0650   TRIG 92 02/11/2014 0650   HDL 65 02/11/2014 0650   CHOLHDL 3.3 02/11/2014 0650   VLDL 18 02/11/2014 0650   LDLCALC 133 (H) 02/11/2014 0650    Physical Exam:   VS:  BP (!) 142/80   Pulse 78   Ht '5\' 4"'  (1.626 m)   Wt 229 lb 9.6 oz (104.1 kg)   SpO2 95%   BMI 39.41 kg/m    Wt Readings from Last 3 Encounters:  09/10/20 229 lb 9.6 oz (104.1 kg)  07/18/20 238 lb (108 kg)  07/03/20 238 lb (108 kg)    General: Well nourished, well developed, in no acute distress Heart: Atraumatic, normal size  Eyes: PEERLA, EOMI  Neck: Supple, no JVD Endocrine: No thryomegaly Cardiac: Normal S1, S2; RRR; no murmurs, rubs, or gallops Lungs: Clear to auscultation bilaterally, no wheezing, rhonchi or rales  Abd: Soft, nontender, no hepatomegaly  Ext: Trace edema Musculoskeletal: No deformities, BUE and BLE strength normal and equal Skin: Warm and  dry, no rashes   Neuro: Alert and oriented to person, place, time, and situation, CNII-XII grossly intact, no focal deficits  Psych: Normal mood and affect   ASSESSMENT:   Cathy Gonzalez is a 74 y.o. female who presents for the following: 1. Palpitations   2. PAC (premature atrial contraction)   3. Nonspecific abnormal electrocardiogram (ECG) (EKG)   4. Mixed hyperlipidemia   5. Primary hypertension   6. Obesity (BMI 30-39.9)   7. Leg edema   8. Type 2 diabetes mellitus with complication, without long-term current use of insulin ([place])     PLAN:   1. Palpitations 2. PAC (premature atrial contraction) -Recent monitor with PACs.  PAC burden less than 1%.  She is having daily fluttering in her chest.  We will start with metoprolol succinate 25 mg a day.  She does have sleep apnea.  Reports she wears her machine.  No issues with this. -I would like for her to get an echocardiogram to make sure she does not have any diastolic dysfunction.  She is high risk for heart failure with preserved ejection fraction. -Repeat TSH.  Needs a BNP  3. Nonspecific abnormal electrocardiogram (ECG) (EKG) -EKG shows possible old anteroseptal infarct.  Echocardiogram ordered.  4. Mixed hyperlipidemia -Most recent LDL cholesterol 122.  Not at goal.  Only on Zetia.  Add Crestor 20 mg a day.  5. Primary hypertension -Blood pressure appears to be well controlled today.  No change in medication.  6. Obesity (BMI 30-39.9) -Weight loss encouraged.  I want her to go back to the gym.  She was given a prescription to go back to the gym.  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  I think she can go to the gym and socially distancing be fine.  7. Leg edema -She does have some lower extremity edema.  We will check a TSH and BNP.  I would also like to repeat her echocardiogram.  I will go ahead and prescribe her Jardiance 10 mg a day.  She does have diabetes  and this is indicated.  8. Type 2 diabetes mellitus with complication, without long-term current use of insulin (HCC) -Jardiance 10 mg a day.  Most recent A1c 6.5.  Disposition: Return in about 3 months (around 12/09/2020).  Medication Adjustments/Labs and Tests Ordered: Current medicines are reviewed at length with the patient today.  Concerns regarding medicines are outlined above.  Orders Placed This Encounter  Procedures  . B Nat Peptide  . TSH  . EKG 12-Lead  . ECHOCARDIOGRAM COMPLETE   Meds ordered this encounter  Medications  . empagliflozin (JARDIANCE) 10 MG TABS tablet    Sig: Take 1 tablet (10 mg total) by mouth daily before breakfast.    Dispense:  90 tablet    Refill:  3  . rosuvastatin (CRESTOR) 20 MG tablet    Sig: Take 1 tablet (20 mg total) by mouth daily.    Dispense:  90 tablet    Refill:  3    Patient Instructions  Medication Instructions:  Start Jardiance 10 mg daily Start Crestor 20 mg daily Continue all other medications *If you need a refill on your cardiac medications before your next appointment, please call your pharmacy*   Lab Work: Bnp,tsh today   Testing/Procedures: Echo   Follow-Up: At [facility], you and your health needs are our priority.  As part of our continuing mission to provide you with exceptional heart care, we have created designated Provider Care Teams.  These Care Teams include your primary Cardiologist (physician) and Advanced Practice Providers (APPs -  Physician Assistants and Nurse Practitioners) who all work together to provide you with the care you need, when you need it.  We recommend signing up for the patient portal called "MyChart".  Sign up information is provided on this After Visit Summary.  MyChart is used to connect with patients for Virtual Visits (Telemedicine).  Patients are able to view lab/test results, encounter notes, upcoming appointments, etc.  Non-urgent messages can be sent to your provider as well.    To learn more about what you can do with MyChart, go to [email or web address].    Your next appointment:  3 months   The format for your next appointment: Office     Provider:  Dr.O'Neal     Signed, Addison Naegeli. Audie Box, [place]  [address] [place], [place] [address] [phone]  09/10/2020 9:06 AM

## 2020-09-10 ENCOUNTER — Ambulatory Visit: Payer: HMO | Admitting: Cardiovascular Disease

## 2020-09-10 ENCOUNTER — Other Ambulatory Visit: Payer: Self-pay

## 2020-09-10 ENCOUNTER — Encounter: Payer: Self-pay | Admitting: Cardiovascular Disease

## 2020-09-10 VITALS — BP 142/80 | HR 78 | Ht 64.0 in | Wt 229.6 lb

## 2020-09-10 DIAGNOSIS — E118 Type 2 diabetes mellitus with unspecified complications: Secondary | ICD-10-CM

## 2020-09-10 DIAGNOSIS — F4320 Adjustment disorder, unspecified: Secondary | ICD-10-CM | POA: Diagnosis not present

## 2020-09-10 DIAGNOSIS — I491 Atrial premature depolarization: Secondary | ICD-10-CM

## 2020-09-10 DIAGNOSIS — R9431 Abnormal electrocardiogram [ECG] [EKG]: Secondary | ICD-10-CM | POA: Diagnosis not present

## 2020-09-10 DIAGNOSIS — I10 Essential (primary) hypertension: Secondary | ICD-10-CM | POA: Diagnosis not present

## 2020-09-10 DIAGNOSIS — E669 Obesity, unspecified: Secondary | ICD-10-CM

## 2020-09-10 DIAGNOSIS — E782 Mixed hyperlipidemia: Secondary | ICD-10-CM

## 2020-09-10 DIAGNOSIS — R600 Localized edema: Secondary | ICD-10-CM

## 2020-09-10 DIAGNOSIS — E785 Hyperlipidemia, unspecified: Secondary | ICD-10-CM | POA: Diagnosis not present

## 2020-09-10 DIAGNOSIS — R002 Palpitations: Secondary | ICD-10-CM

## 2020-09-10 DIAGNOSIS — I739 Peripheral vascular disease, unspecified: Secondary | ICD-10-CM | POA: Diagnosis not present

## 2020-09-10 DIAGNOSIS — E1169 Type 2 diabetes mellitus with other specified complication: Secondary | ICD-10-CM | POA: Diagnosis not present

## 2020-09-10 MED ORDER — EMPAGLIFLOZIN 10 MG PO TABS: 10.0000 mg | ORAL_TABLET | Freq: Every day | ORAL | 3 refills | Status: DC

## 2020-09-10 MED ORDER — ROSUVASTATIN CALCIUM 20 MG PO TABS: 20.0000 mg | ORAL_TABLET | Freq: Every day | ORAL | 3 refills | Status: DC

## 2020-09-10 NOTE — Patient Instructions (Signed)
Medication Instructions:  Start Jardiance 10 mg daily Start Crestor 20 mg daily Continue all other medications *If you need a refill on your cardiac medications before your next appointment, please call your pharmacy*   Lab Work: Bnp,tsh today   Testing/Procedures: Echo   Follow-Up: At [facility], you and your health needs are our priority.  As part of our continuing mission to provide you with exceptional heart care, we have created designated Provider Care Teams.  These Care Teams include your primary Cardiologist (physician) and Advanced Practice Providers (APPs -  Physician Assistants and Nurse Practitioners) who all work together to provide you with the care you need, when you need it.  We recommend signing up for the patient portal called "MyChart".  Sign up information is provided on this After Visit Summary.  MyChart is used to connect with patients for Virtual Visits (Telemedicine).  Patients are able to view lab/test results, encounter notes, upcoming appointments, etc.  Non-urgent messages can be sent to your provider as well.   To learn more about what you can do with MyChart, go to [email or web address].    Your next appointment:  3 months   The format for your next appointment: Office     Provider:  Dr.O'Neal

## 2020-09-11 LAB — BRAIN NATRIURETIC PEPTIDE: BNP: 4.2 pg/mL (ref 0.0–100.0)

## 2020-09-11 LAB — TSH: TSH: 1.68 u[IU]/mL (ref 0.450–4.500)

## 2020-09-18 DIAGNOSIS — H5203 Hypermetropia, bilateral: Secondary | ICD-10-CM | POA: Diagnosis not present

## 2020-09-18 DIAGNOSIS — E119 Type 2 diabetes mellitus without complications: Secondary | ICD-10-CM | POA: Diagnosis not present

## 2020-09-18 DIAGNOSIS — H524 Presbyopia: Secondary | ICD-10-CM | POA: Diagnosis not present

## 2020-10-08 ENCOUNTER — Other Ambulatory Visit: Payer: Self-pay

## 2020-10-08 ENCOUNTER — Ambulatory Visit (HOSPITAL_COMMUNITY): Payer: HMO | Attending: Cardiology

## 2020-10-08 DIAGNOSIS — L97221 Non-pressure chronic ulcer of left calf limited to breakdown of skin: Secondary | ICD-10-CM | POA: Diagnosis not present

## 2020-10-08 DIAGNOSIS — I491 Atrial premature depolarization: Secondary | ICD-10-CM | POA: Insufficient documentation

## 2020-10-08 DIAGNOSIS — E669 Obesity, unspecified: Secondary | ICD-10-CM | POA: Insufficient documentation

## 2020-10-08 DIAGNOSIS — R600 Localized edema: Secondary | ICD-10-CM | POA: Insufficient documentation

## 2020-10-08 DIAGNOSIS — I10 Essential (primary) hypertension: Secondary | ICD-10-CM | POA: Diagnosis not present

## 2020-10-08 DIAGNOSIS — E1140 Type 2 diabetes mellitus with diabetic neuropathy, unspecified: Secondary | ICD-10-CM | POA: Diagnosis not present

## 2020-10-08 DIAGNOSIS — E782 Mixed hyperlipidemia: Secondary | ICD-10-CM | POA: Insufficient documentation

## 2020-10-08 DIAGNOSIS — E118 Type 2 diabetes mellitus with unspecified complications: Secondary | ICD-10-CM | POA: Insufficient documentation

## 2020-10-08 DIAGNOSIS — R002 Palpitations: Secondary | ICD-10-CM | POA: Diagnosis not present

## 2020-10-08 DIAGNOSIS — R9431 Abnormal electrocardiogram [ECG] [EKG]: Secondary | ICD-10-CM | POA: Diagnosis not present

## 2020-10-08 LAB — ECHOCARDIOGRAM COMPLETE
Area-P 1/2: 3.85 cm2
S' Lateral: 2.6 cm

## 2020-10-09 ENCOUNTER — Other Ambulatory Visit: Payer: Self-pay

## 2020-10-15 DIAGNOSIS — E1169 Type 2 diabetes mellitus with other specified complication: Secondary | ICD-10-CM | POA: Diagnosis not present

## 2020-10-15 DIAGNOSIS — J029 Acute pharyngitis, unspecified: Secondary | ICD-10-CM | POA: Diagnosis not present

## 2020-10-15 DIAGNOSIS — I10 Essential (primary) hypertension: Secondary | ICD-10-CM | POA: Diagnosis not present

## 2020-10-15 DIAGNOSIS — Z20822 Contact with and (suspected) exposure to covid-19: Secondary | ICD-10-CM | POA: Diagnosis not present

## 2020-10-15 DIAGNOSIS — L97221 Non-pressure chronic ulcer of left calf limited to breakdown of skin: Secondary | ICD-10-CM | POA: Diagnosis not present

## 2020-10-15 DIAGNOSIS — Z7189 Other specified counseling: Secondary | ICD-10-CM | POA: Diagnosis not present

## 2020-10-19 ENCOUNTER — Other Ambulatory Visit: Payer: HMO

## 2020-10-19 DIAGNOSIS — Z20822 Contact with and (suspected) exposure to covid-19: Secondary | ICD-10-CM | POA: Diagnosis not present

## 2020-10-21 LAB — SARS-COV-2, NAA 2 DAY TAT

## 2020-10-21 LAB — NOVEL CORONAVIRUS, NAA: SARS-CoV-2, NAA: NOT DETECTED

## 2020-10-25 DIAGNOSIS — E089 Diabetes mellitus due to underlying condition without complications: Secondary | ICD-10-CM | POA: Diagnosis not present

## 2020-10-25 DIAGNOSIS — L97921 Non-pressure chronic ulcer of unspecified part of left lower leg limited to breakdown of skin: Secondary | ICD-10-CM | POA: Diagnosis not present

## 2020-10-25 DIAGNOSIS — I10 Essential (primary) hypertension: Secondary | ICD-10-CM | POA: Diagnosis not present

## 2020-10-25 DIAGNOSIS — F064 Anxiety disorder due to known physiological condition: Secondary | ICD-10-CM | POA: Diagnosis not present

## 2020-11-07 ENCOUNTER — Other Ambulatory Visit: Payer: Self-pay

## 2020-11-07 ENCOUNTER — Encounter: Payer: Self-pay | Admitting: Orthopaedic Surgery

## 2020-11-07 ENCOUNTER — Ambulatory Visit: Payer: HMO | Admitting: Orthopaedic Surgery

## 2020-11-07 DIAGNOSIS — M19042 Primary osteoarthritis, left hand: Secondary | ICD-10-CM | POA: Diagnosis not present

## 2020-11-07 MED ORDER — BUPIVACAINE HCL 0.25 % IJ SOLN
0.3300 mL | INTRAMUSCULAR | Status: AC | PRN
  Administered 2020-11-07: .33 mL

## 2020-11-07 NOTE — Progress Notes (Signed)
Office Visit Note   Patient: Cathy Gonzalez           Date of Birth: [birth year]           MRN: [id number] Visit Date: 11/07/2020              Requested by: [name], [address] [place],  [place] [address] PCP: [name], MD   Assessment & Plan: Visit Diagnoses:  1. Primary osteoarthritis, left hand     Plan: Osteoarthritis with swelling metacarpal phalangeal joint left index finger.  Will inject with betamethasone  Follow-Up Instructions: Return if symptoms worsen or fail to improve.   Orders:  Orders Placed This Encounter  Procedures  . Hand/UE Inj: L index MCP   No orders of the defined types were placed in this encounter.     Procedures: Hand/UE Inj: L index MCP for osteoarthritis on 11/07/2020 2:02 PM Medications: 0.33 mL bupivacaine 0.25 %  3 mg betamethasone injected with Marcaine into the metacarpal phalangeal joint left index finger      Clinical Data: No additional findings.   Subjective: Chief Complaint  Patient presents with  . Left Hand - Pain  Patient presents today for her left hand. She was here last in October of 2021 and received a [facility] joint injection. She said that it "helped for awhile". She has a knot at the area of the second metacarpal that comes and goes. She said that she has pain that radiates up to her elbow at night. She takes Tylenol for pain relief. She is right hand dominant.  Had prior films of her left hand in October 2021 demonstrating some degenerative changes about the metacarpal phalangeal joint of the index finger  HPI  Review of Systems   Objective: Vital Signs: Ht 5\' 4"  (1.626 m)   Wt 229 lb (103.9 kg)   BMI 39.31 kg/m   Physical Exam Constitutional:      Appearance: She is well-developed and well-nourished.  HENT:     Mouth/Throat:     Mouth: Oropharynx is clear and moist.  Eyes:     Extraocular Movements: EOM normal.     Pupils: Pupils are equal, round, and reactive to light.  Pulmonary:      Effort: Pulmonary effort is normal.  Skin:    General: Skin is warm and dry.  Neurological:     Mental Status: She is alert and oriented to person, place, and time.  Psychiatric:        Mood and Affect: Mood and affect normal.        Behavior: Behavior normal.     Ortho Exam left hand with some localized swelling about the metacarpal phalangeal joint of the index finger.  Some local tenderness.  No redness able to make a full fist and full extension.  Extensor tendon intact.  Does have degenerative changes at the base of the thumb per previous evaluations but that is asymptomatic.  Normal sensation to the index finger  Specialty Comments:  No specialty comments available.  Imaging: No results found.   PMFS History: Patient Active Problem List   Diagnosis Date Noted  . Primary osteoarthritis, left hand 11/07/2020  . Arthritis of carpometacarpal (CMC) joint of thumb 07/18/2020  . Bilateral primary osteoarthritis of knee 06/20/2020  . Bilateral primary osteoarthritis of hip 06/07/2020  . Right thigh pain 05/09/2020  . Low back pain 07/14/2019  . OSA on CPAP 05/16/2019  . Diabetic neuropathy associated with type  2 diabetes mellitus ([place]) 12/13/2018  . Chronic pain of right knee 12/09/2018  . Thumb pain, left 12/09/2018  . Dizziness and giddiness 01/14/2016  . Hepatic cirrhosis ([place]) 07/26/2015  . Nocturnal seizures ([place]) 05/14/2015  . Chronic hepatitis C without hepatic coma ([place]) 03/14/2015  . Vertigo, central 02/11/2014  . Difficulty walking 02/11/2014  . Diabetes mellitus ([place]) 02/11/2014  . Dysphagia 02/11/2014  . OBESITY 07/26/2010  . Essential hypertension, benign 07/26/2010  . DEGENERATIVE JOINT DISEASE, GENERALIZED 07/26/2010   Past Medical History:  Diagnosis Date  . Arthritis   . Cataract    left cataract removed in 2017  . Diabetes mellitus   . Diabetic neuropathy ([place])   . Diabetic neuropathy, type II diabetes mellitus ([place])   . Hepatitis C    took tx for    . Hypertension   . Hypokalemia   . Knee pain    right   . Morbidly obese ([place])   . Nocturnal seizures ([place]) 05/14/2015   no bad seizure since 1980's  . OSA on CPAP    uses cpap 2-3 week  . Plantar fasciitis    both feet    Family History  Problem Relation Age of Onset  . Diabetes Mother   . Heart attack Mother   . Diabetes Sister   . Diabetes Brother   . Cancer Sister        breast  . Breast cancer Sister   . Seizures Neg Hx     Past Surgical History:  Procedure Laterality Date  . ABDOMINAL HYSTERECTOMY     partial  . CATARACT EXTRACTION Left   . COLONOSCOPY WITH PROPOFOL N/A 08/21/2015   Procedure: COLONOSCOPY WITH PROPOFOL;  Surgeon: Carol Ada, MD;  [facility];  Service: Endoscopy;  Laterality: N/A;  . ESOPHAGOGASTRODUODENOSCOPY (EGD) WITH PROPOFOL N/A 08/21/2015   Procedure: ESOPHAGOGASTRODUODENOSCOPY (EGD) WITH PROPOFOL;  Surgeon: Carol Ada, MD;  [facility];  Service: Endoscopy;  Laterality: N/A;  . ESOPHAGOGASTRODUODENOSCOPY (EGD) WITH PROPOFOL N/A 10/08/2018   Procedure: ESOPHAGOGASTRODUODENOSCOPY (EGD) WITH PROPOFOL;  Surgeon: Carol Ada, MD;  [facility];  Service: Endoscopy;  Laterality: N/A;  . EYE SURGERY Bilateral    cataracts   Social History   Occupational History  . Occupation: [employer]  Tobacco Use  . Smoking status: Never Smoker  . Smokeless tobacco: Never Used  Vaping Use  . Vaping Use: Never used  Substance and Sexual Activity  . Alcohol use: No    Alcohol/week: 0.0 standard drinks  . Drug use: No  . Sexual activity: Never

## 2020-11-13 ENCOUNTER — Ambulatory Visit: Payer: HMO | Admitting: Podiatry

## 2020-11-20 ENCOUNTER — Other Ambulatory Visit: Payer: Self-pay | Admitting: Neurology

## 2020-11-21 ENCOUNTER — Other Ambulatory Visit: Payer: Self-pay

## 2020-11-21 ENCOUNTER — Encounter (HOSPITAL_BASED_OUTPATIENT_CLINIC_OR_DEPARTMENT_OTHER): Payer: HMO | Attending: Internal Medicine | Admitting: Physician Assistant

## 2020-11-21 DIAGNOSIS — E11622 Type 2 diabetes mellitus with other skin ulcer: Secondary | ICD-10-CM | POA: Insufficient documentation

## 2020-11-21 DIAGNOSIS — L97822 Non-pressure chronic ulcer of other part of left lower leg with fat layer exposed: Secondary | ICD-10-CM | POA: Insufficient documentation

## 2020-11-21 DIAGNOSIS — E1142 Type 2 diabetes mellitus with diabetic polyneuropathy: Secondary | ICD-10-CM | POA: Insufficient documentation

## 2020-11-21 DIAGNOSIS — I872 Venous insufficiency (chronic) (peripheral): Secondary | ICD-10-CM | POA: Diagnosis not present

## 2020-11-21 DIAGNOSIS — G40909 Epilepsy, unspecified, not intractable, without status epilepticus: Secondary | ICD-10-CM | POA: Diagnosis not present

## 2020-11-23 NOTE — Progress Notes (Signed)
Cathy Gonzalez, Cathy Gonzalez ([id number]) Visit Report for 11/21/2020 Abuse/Suicide Risk Screen Details Patient Name: Date of Service: Cathy Gonzalez 11/21/2020 1:15 PM Medical Record Number: [id number] Patient Account Number: [id number] Date of Birth/Sex: Treating RN: November 26, 1945 (75 y.o. Female) Levan Hurst Primary Care Lyzbeth Genrich: [name] Other Clinician: Referring Stedman [place]: Treating Cale Bethard/Extender: [name] in Treatment: 0 Abuse/Suicide Risk Screen Items Answer ABUSE RISK SCREEN: Has anyone close to you tried to hurt or harm you recentlyo No Do you feel uncomfortable with anyone in your familyo No Has anyone forced you do things that you didnt want to doo No Electronic Signature(s) Signed: 11/23/2020 4:25:56 PM By: Levan Hurst RN, BSN Entered By: Levan Hurst on 11/21/2020 13:29:28 -------------------------------------------------------------------------------- Activities of Daily Living Details Patient Name: Date of Service: Cathy Gonzalez 11/21/2020 1:15 PM Medical Record Number: [id number] Patient Account Number: [id number] Date of Birth/Sex: Treating RN: 1946-01-10 (75 y.o. Female) Levan Hurst Primary Care Erle Guster: [name] Other Clinician: Referring Daren Yeagle: Treating Harrison Zetina/Extender: [name] in Treatment: 0 Activities of Daily Living Items Answer Activities of Daily Living (Please select one for each item) Drive Automobile Completely Able T Medications ake Completely Able Use T elephone Completely Able Care for Appearance Completely Able Use T oilet Completely Able Bath / Shower Completely Able Dress Self Completely Able Feed Self Completely Able Walk Completely Able Get In / Out Bed Completely Able Housework Completely Able Prepare Meals Completely [place] for Self Completely Able Electronic Signature(s) Signed: 11/23/2020 4:25:56 PM By: Levan Hurst RN,  BSN Entered By: Levan Hurst on 11/21/2020 13:29:46 -------------------------------------------------------------------------------- Education Screening Details Patient Name: Date of Service: Cathy Gonzalez. 11/21/2020 1:15 PM Medical Record Number: [id number] Patient Account Number: [id number] Date of Birth/Sex: Treating RN: 03-Aug-1946 (75 y.o. Female) Levan Hurst Primary Care Hasna Stefanik: [name] Other Clinician: Referring Jaysten Essner: Treating Sandor Arboleda/Extender: Katherine Roan in Treatment: 0 Primary Learner Assessed: Patient Learning Preferences/Education Level/Primary Language Learning Preference: Explanation, Demonstration, Printed Material Highest Education Level: High School Preferred Language: English Cognitive Barrier Language Barrier: No Translator Needed: No Memory Deficit: No Emotional Barrier: No Cultural/Religious Beliefs Affecting Medical Care: No Physical Barrier Impaired Vision: No Impaired Hearing: No Decreased Hand dexterity: No Knowledge/Comprehension Knowledge Level: High Comprehension Level: High Ability to understand written instructions: High Ability to understand verbal instructions: High Motivation Anxiety Level: Calm Cooperation: Cooperative Education Importance: Acknowledges Need Interest in Health Problems: Asks Questions Perception: Coherent Willingness to Engage in Self-Management High Activities: Readiness to Engage in Self-Management High Activities: Electronic Signature(s) Signed: 11/23/2020 4:25:56 PM By: Levan Hurst RN, BSN Entered By: Levan Hurst on 11/21/2020 13:30:05 -------------------------------------------------------------------------------- Fall Risk Assessment Details Patient Name: Date of Service: Cathy Gonzalez 11/21/2020 1:15 PM Medical Record Number: [id number] Patient Account Number: [id number] Date of Birth/Sex: Treating RN: September 06, 1946 (74 y.o. Female) Levan Hurst Primary  Care Reyah Streeter: [name] Other Clinician: Referring Baeleigh Devincent: Treating Jontrell Bushong/Extender: [name] in Treatment: 0 Fall Risk Assessment Items Have you had 2 or more falls in the last 12 monthso 0 No Have you had any fall that resulted in injury in the last 12 monthso 0 No FALLS RISK SCREEN History of falling - immediate or within 3 months 0 No Secondary diagnosis (Do you have 2 or more medical diagnoseso) 15 Yes Ambulatory aid None/bed rest/wheelchair/nurse 0 Yes Crutches/cane/walker 0 No Furniture 0 No Intravenous  therapy Access/Saline/Heparin Lock 0 No Gait/Transferring Normal/ bed rest/ wheelchair 0 Yes Weak (short steps with or without shuffle, stooped but able to lift head while walking, may seek 0 No support from furniture) Impaired (short steps with shuffle, may have difficulty arising from chair, head down, impaired 0 No balance) Mental Status Oriented to own ability 0 Yes Electronic Signature(s) Signed: 11/23/2020 4:25:56 PM By: Levan Hurst RN, BSN Entered By: Levan Hurst on 11/21/2020 13:30:21 -------------------------------------------------------------------------------- Foot Assessment Details Patient Name: Date of Service: Cathy Gonzalez 11/21/2020 1:15 PM Medical Record Number: [id number] Patient Account Number: [id number] Date of Birth/Sex: Treating RN: 1946/07/11 (75 y.o. Female) Levan Hurst Primary Care Fathima Bartl: [name] Other Clinician: Referring Brennley Curtice: Treating Mykel Mohl/Extender: [name] in Treatment: 0 Foot Assessment Items Site Locations + = Sensation present, - = Sensation absent, C = Callus, U = Ulcer R = Redness, W = Warmth, M = Maceration, PU = Pre-ulcerative lesion F = Fissure, S = Swelling, D = Dryness Assessment Right: Left: Other Deformity: No No Prior Foot Ulcer: No No Prior Amputation: No No Charcot Joint: No No Ambulatory Status: Ambulatory Without  Help Gait: Steady Electronic Signature(s) Signed: 11/23/2020 4:25:56 PM By: Levan Hurst RN, BSN Entered By: Levan Hurst on 11/21/2020 13:31:11 -------------------------------------------------------------------------------- Nutrition Risk Screening Details Patient Name: Date of Service: Cathy Gonzalez 11/21/2020 1:15 PM Medical Record Number: [id number] Patient Account Number: [id number] Date of Birth/Sex: Treating RN: 1946/08/11 (75 y.o. Female) Levan Hurst Primary Care Kyrin Garn: [name] Other Clinician: Referring Ezriel Boffa: Treating Janus Vlcek/Extender: [name] in Treatment: 0 Height (in): 64 Weight (lbs): 225 Body Mass Index (BMI): 38.6 Nutrition Risk Screening Items Score Screening NUTRITION RISK SCREEN: I have an illness or condition that made me change the kind and/or amount of food I eat 2 Yes I eat fewer than two meals per day 0 No I eat few fruits and vegetables, or milk products 0 No I have three or more drinks of beer, liquor or wine almost every day 0 No I have tooth or mouth problems that make it hard for me to eat 0 No I don't always have enough money to buy the food I need 0 No I eat alone most of the time 0 No I take three or more different prescribed or over-the-counter drugs a day 1 Yes Without wanting to, I have lost or gained 10 pounds in the last six months 0 No I am not always physically able to shop, cook and/or feed myself 0 No Nutrition Protocols Good Risk Protocol Moderate Risk Protocol 0 Provide education on nutrition High Risk Proctocol Risk Level: Moderate Risk Score: 3 Electronic Signature(s) Signed: 11/23/2020 4:25:56 PM By: Levan Hurst RN, BSN Entered By: Levan Hurst on 11/21/2020 13:30:47

## 2020-11-23 NOTE — Progress Notes (Signed)
CARDELIA, SASSANO ([id number]) Visit Report for 11/21/2020 Chief Complaint Document Details Patient Name: Date of Service: Cathy Gonzalez 11/21/2020 1:15 PM Medical Record Number: [id number] Patient Account Number: [id number] Date of Birth/Sex: Treating RN: Jun 12, 1946 (75 y.o. Female) Baruch Gouty Primary Care Provider: [name] Other Clinician: Referring Provider: Treating Provider/Extender: [name] in Treatment: 0 Information Obtained from: Patient Chief Complaint Left LE Ulcer Electronic Signature(s) Signed: 11/21/2020 1:46:24 PM By: Worthy Keeler PA-C Entered By: Worthy Keeler on 11/21/2020 13:46:24 -------------------------------------------------------------------------------- Debridement Details Patient Name: Date of Service: Cathy Gonzalez 11/21/2020 1:15 PM Medical Record Number: [id number] Patient Account Number: [id number] Date of Birth/Sex: Treating RN: 1946/03/02 (75 y.o. Female) Baruch Gouty Primary Care Provider: [name] Other Clinician: Referring Provider: Treating Provider/Extender: [name] in Treatment: 0 Debridement Performed for Assessment: Wound #1 Left,Anterior Lower Leg Performed By: Physician Worthy Keeler, PA Debridement Type: Debridement Severity of Tissue Pre Debridement: Fat layer exposed Level of Consciousness (Pre-procedure): Awake and Alert Pre-procedure Verification/Time Out Yes - 13:50 Taken: Start Time: 13:51 Pain Control: Lidocaine 4% T opical Solution T Area Debrided (L x W): otal 0.4 (cm) x 0.5 (cm) = 0.2 (cm) Tissue and other material debrided: Viable, Non-Viable, Slough, Subcutaneous, Slough Level: Skin/Subcutaneous Tissue Debridement Description: Excisional Instrument: Curette Bleeding: Minimum Hemostasis Achieved: Pressure End Time: 13:54 Procedural Pain: 2 Post Procedural Pain: 0 Response to Treatment: Procedure was tolerated well Level of  Consciousness (Post- Awake and Alert procedure): Post Debridement Measurements of Total Wound Length: (cm) 0.4 Width: (cm) 0.5 Depth: (cm) 0.1 Volume: (cm) 0.016 Character of Wound/Ulcer Post Debridement: Improved Severity of Tissue Post Debridement: Fat layer exposed Post Procedure Diagnosis Same as Pre-procedure Electronic Signature(s) Signed: 11/21/2020 5:28:15 PM By: Worthy Keeler PA-C Signed: 11/21/2020 6:01:36 PM By: Baruch Gouty RN, BSN Entered By: Baruch Gouty on 11/21/2020 13:53:30 -------------------------------------------------------------------------------- HPI Details Patient Name: Date of Service: Cathy Commons EREE N. 11/21/2020 1:15 PM Medical Record Number: [id number] Patient Account Number: [id number] Date of Birth/Sex: Treating RN: January 30, 1946 (75 y.o. Female) Baruch Gouty Primary Care Provider: [name] Other Clinician: Referring Provider: Treating Provider/Extender: [name] in Treatment: 0 History of Present Illness HPI Description: 11/21/2020 upon evaluation today patient appears to be doing well with regard to her wound all things considered. With that being said she has been tolerating the dressing changes without complication. Fortunately there is no signs of active infection at this time which is great news. No fevers, chills, nausea, vomiting, or diarrhea. In general I am extremely happy with the fact that the patient seems to be making such good progress here. She tells me it has come along way from where it started. Nonetheless it has been about a month and still not closed. She does have chronic venous stasis, diabetes mellitus type 2, her most recent hemoglobin A1c was 6.5 although I am not exactly sure of the date on that. That is just per the records reviewed. She does have hypertension and chronic viral hepatitis C as well. She also has neuropathy in her feet. This apparently is secondary to the diabetes. There  does not appear to be any signs of active infection right now which is great news she has been on 2 rounds of Keflex as prescribed by the primary care provider I see no signs of infection at this point. The patient did have use of Voltaren gel  as well as Thera works that she had utilized prior to this starting and she feels like that may have caused the issue. Electronic Signature(s) Signed: 11/21/2020 2:01:33 PM By: Worthy Keeler PA-C Entered By: Worthy Keeler on 11/21/2020 14:01:33 -------------------------------------------------------------------------------- Physical Exam Details Patient Name: Date of Service: Cathy Gonzalez 11/21/2020 1:15 PM Medical Record Number: [id number] Patient Account Number: [id number] Date of Birth/Sex: Treating RN: 03-23-1946 (75 y.o. Female) Baruch Gouty Primary Care Provider: [name] Other Clinician: Referring Provider: Treating Provider/Extender: [name] in Treatment: 0 Constitutional sitting or standing blood pressure is within target range for patient.. pulse regular and within target range for patient.[name] respirations regular, non-labored and within target range for patient.[name] temperature within target range for patient.. Well-nourished and well-hydrated in no acute distress. Eyes conjunctiva clear no eyelid edema noted. pupils equal round and reactive to light and accommodation. Ears, Nose, Mouth, and Throat no gross abnormality of ear auricles or external auditory canals. normal hearing noted during conversation. mucus membranes moist. Respiratory normal breathing without difficulty. Cardiovascular 2+ dorsalis pedis/posterior tibialis pulses. 1+ pitting edema of the bilateral lower extremities. Musculoskeletal normal gait and posture. no significant deformity or arthritic changes, no loss or range of motion, no clubbing. Psychiatric this patient is able to make decisions and demonstrates good insight into  disease process. Alert and Oriented x 3. pleasant and cooperative. Notes Upon inspection patient's wound bed appears to be doing okay she does have necrotic tissue noted on the surface of the wound including slough there really is no eschar. With that being said I did explain that debridement would be the ideal thing here to try to help out to get this moving in the right direction she was in agreement with that we did sharply debride the wound today down to good subcutaneous tissue without complication post debridement the wound bed appears to be doing much better which is great news. She does have 1+ pitting edema but otherwise no major medical problems that I see that would affect her healing I do think she may benefit from a compression wrap. Electronic Signature(s) Signed: 11/21/2020 2:02:10 PM By: Worthy Keeler PA-C Entered By: Worthy Keeler on 11/21/2020 14:02:09 -------------------------------------------------------------------------------- Physician Orders Details Patient Name: Date of Service: Cathy Gonzalez 11/21/2020 1:15 PM Medical Record Number: [id number] Patient Account Number: [id number] Date of Birth/Sex: Treating RN: June 22, 1946 (75 y.o. Female) Baruch Gouty Primary Care Provider: [name] Other Clinician: Referring Provider: Treating Provider/Extender: [name] in Treatment: 0 Verbal / Phone Orders: No Diagnosis Coding ICD-10 Coding Code Description I87.2 Venous insufficiency (chronic) (peripheral) L97.822 Non-pressure chronic ulcer of other part of left lower leg with fat layer exposed E11.622 Type 2 diabetes mellitus with other skin ulcer E11.40 Type 2 diabetes mellitus with diabetic neuropathy, unspecified I10 Essential (primary) hypertension B18.2 Chronic viral hepatitis C Follow-up Appointments Return Appointment in 1 week. Bathing/ Shower/ Hygiene May shower with protection but do not get wound dressing(s) wet. - use  cast protector over wrap to protect in shower Edema Control - Lymphedema / SCD / Other Bilateral Lower Extremities Elevate legs to the level of the heart or above for 30 minutes daily and/or when sitting, a frequency of: - throughout the day Avoid standing for long periods of time. Exercise regularly Wound Treatment Wound #1 - Lower Leg Wound Laterality: Left, Anterior Peri-Wound Care: Sween Lotion (Moisturizing lotion) 1 x Per Week/7 Days Discharge  Instructions: Apply moisturizing lotion to leg Prim Dressing: Promogran Prisma Matrix, 4.34 (sq in) (silver collagen) 1 x Per Week/7 Days ary Discharge Instructions: Moisten collagen with saline or hydrogel Secondary Dressing: Woven Gauze Sponge, Non-Sterile 4x4 in 1 x Per Week/7 Days Discharge Instructions: Apply over primary dressing as directed. Compression Wrap: ThreePress (3 layer compression wrap) 1 x Per Week/7 Days Discharge Instructions: Apply three layer compression as directed. Electronic Signature(s) Signed: 11/21/2020 5:28:15 PM By: Worthy Keeler PA-C Signed: 11/21/2020 6:01:36 PM By: Baruch Gouty RN, BSN Entered By: Baruch Gouty on 11/21/2020 13:55:58 -------------------------------------------------------------------------------- Problem List Details Patient Name: Date of Service: Cathy Gonzalez 11/21/2020 1:15 PM Medical Record Number: [id number] Patient Account Number: [id number] Date of Birth/Sex: Treating RN: 1946-01-16 (75 y.o. Female) Baruch Gouty Primary Care Provider: [name] Other Clinician: Referring Provider: Treating Provider/Extender: [name] in Treatment: 0 Active Problems ICD-10 Encounter Code Description Active Date MDM Diagnosis I87.2 Venous insufficiency (chronic) (peripheral) 11/21/2020 No Yes L97.822 Non-pressure chronic ulcer of other part of left lower leg with fat layer exposed2/16/2022 No Yes E11.622 Type 2 diabetes mellitus with other skin ulcer  11/21/2020 No Yes E11.40 Type 2 diabetes mellitus with diabetic neuropathy, unspecified 11/21/2020 No Yes I10 Essential (primary) hypertension 11/21/2020 No Yes B18.2 Chronic viral hepatitis C 11/21/2020 No Yes Inactive Problems Resolved Problems Electronic Signature(s) Signed: 11/21/2020 1:46:11 PM By: Worthy Keeler PA-C Entered By: Worthy Keeler on 11/21/2020 13:46:11 -------------------------------------------------------------------------------- Progress Note Details Patient Name: Date of Service: Cathy Gonzalez. 11/21/2020 1:15 PM Medical Record Number: [id number] Patient Account Number: [id number] Date of Birth/Sex: Treating RN: 01-31-1946 (75 y.o. Female) Baruch Gouty Primary Care Provider: [name] Other Clinician: Referring Provider: Treating Provider/Extender: [name] in Treatment: 0 Subjective Chief Complaint Information obtained from Patient Left LE Ulcer History of Present Illness (HPI) 11/21/2020 upon evaluation today patient appears to be doing well with regard to her wound all things considered. With that being said she has been tolerating the dressing changes without complication. Fortunately there is no signs of active infection at this time which is great news. No fevers, chills, nausea, vomiting, or diarrhea. In general I am extremely happy with the fact that the patient seems to be making such good progress here. She tells me it has come along way from where it started. Nonetheless it has been about a month and still not closed. She does have chronic venous stasis, diabetes mellitus type 2, her most recent hemoglobin A1c was 6.5 although I am not exactly sure of the date on that. That is just per the records reviewed. She does have hypertension and chronic viral hepatitis C as well. She also has neuropathy in her feet. This apparently is secondary to the diabetes. There does not appear to be any signs of active infection right  now which is great news she has been on 2 rounds of Keflex as prescribed by the primary care provider I see no signs of infection at this point. The patient did have use of Voltaren gel as well as Thera works that she had utilized prior to this starting and she feels like that may have caused the issue. Patient History Information obtained from Patient. Allergies No Known Drug Allergies Family History Cancer - Siblings, Diabetes - Mother,Siblings, Heart Disease - Mother, No family history of Hereditary Spherocytosis, Hypertension, Kidney Disease, Lung Disease, Seizures, Stroke, Thyroid Problems, Tuberculosis. Social History Never smoker, Alcohol  Use - Never, Drug Use - No History, Caffeine Use - Rarely. Medical History Respiratory Patient has history of Sleep Apnea - wears cpap Cardiovascular Patient has history of Hypertension Gastrointestinal Patient has history of Hepatitis C Endocrine Patient has history of Type II Diabetes Musculoskeletal Patient has history of Gout, Osteoarthritis Neurologic Patient has history of Neuropathy, Seizure Disorder - nocturnal seizures Patient is treated with Oral Agents. Blood sugar is tested. Medical A Surgical History Notes nd Cardiovascular Hyperlipidemia Review of Systems (ROS) Constitutional Symptoms (General Health) Denies complaints or symptoms of Fatigue, Fever, Chills, Marked Weight Change. Eyes Complains or has symptoms of Glasses / Contacts - glasses. Ear/Nose/Mouth/Throat Denies complaints or symptoms of Chronic sinus problems or rhinitis. Genitourinary Denies complaints or symptoms of Frequent urination. Integumentary (Skin) Complains or has symptoms of Wounds - wound on left lower leg. Psychiatric Denies complaints or symptoms of Claustrophobia, Suicidal. Objective Constitutional sitting or standing blood pressure is within target range for patient.. pulse regular and within target range for patient.[name] respirations regular,  non-labored and within target range for patient.[name] temperature within target range for patient.. Well-nourished and well-hydrated in no acute distress. Vitals Time Taken: 1:12 PM, Height: 64 in, Source: Stated, Weight: 225 lbs, Source: Stated, BMI: 38.6, Temperature: 98.7 F, Pulse: 81 bpm, Respiratory Rate: 18 breaths/min, Blood Pressure: 139/77 mmHg, Capillary Blood Glucose: 112 mg/dl. General Notes: glucose per pt report Eyes conjunctiva clear no eyelid edema noted. pupils equal round and reactive to light and accommodation. Ears, Nose, Mouth, and Throat no gross abnormality of ear auricles or external auditory canals. normal hearing noted during conversation. mucus membranes moist. Respiratory normal breathing without difficulty. Cardiovascular 2+ dorsalis pedis/posterior tibialis pulses. 1+ pitting edema of the bilateral lower extremities. Musculoskeletal normal gait and posture. no significant deformity or arthritic changes, no loss or range of motion, no clubbing. Psychiatric this patient is able to make decisions and demonstrates good insight into disease process. Alert and Oriented x 3. pleasant and cooperative. General Notes: Upon inspection patient's wound bed appears to be doing okay she does have necrotic tissue noted on the surface of the wound including slough there really is no eschar. With that being said I did explain that debridement would be the ideal thing here to try to help out to get this moving in the right direction she was in agreement with that we did sharply debride the wound today down to good subcutaneous tissue without complication post debridement the wound bed appears to be doing much better which is great news. She does have 1+ pitting edema but otherwise no major medical problems that I see that would affect her healing I do think she may benefit from a compression wrap. Integumentary (Hair, Skin) Wound #1 status is Open. Original cause of wound was  Gradually Appeared. The wound is located on the Left,Anterior Lower Leg. The wound measures 0.4cm length x 0.5cm width x 0.1cm depth; 0.157cm^2 area and 0.016cm^3 volume. There is no tunneling or undermining noted. There is a medium amount of serous drainage noted. The wound margin is flat and intact. There is no granulation within the wound bed. There is a large (67-100%) amount of necrotic tissue within the wound bed including Adherent Slough. Assessment Active Problems ICD-10 Venous insufficiency (chronic) (peripheral) Non-pressure chronic ulcer of other part of left lower leg with fat layer exposed Type 2 diabetes mellitus with other skin ulcer Type 2 diabetes mellitus with diabetic neuropathy, unspecified Essential (primary) hypertension Chronic viral hepatitis C Procedures Wound #1 Pre-procedure diagnosis  of Wound #1 is a Venous Leg Ulcer located on the Left,Anterior Lower Leg .Severity of Tissue Pre Debridement is: Fat layer exposed. There was a Excisional Skin/Subcutaneous Tissue Debridement with a total area of 0.2 sq cm performed by Worthy Keeler, PA. With the following instrument(s): Curette to remove Viable and Non-Viable tissue/material. Material removed includes Subcutaneous Tissue and Slough and after achieving pain control using Lidocaine 4% T opical Solution. No specimens were taken. A time out was conducted at 13:50, prior to the start of the procedure. A Minimum amount of bleeding was controlled with Pressure. The procedure was tolerated well with a pain level of 2 throughout and a pain level of 0 following the procedure. Post Debridement Measurements: 0.4cm length x 0.5cm width x 0.1cm depth; 0.016cm^3 volume. Character of Wound/Ulcer Post Debridement is improved. Severity of Tissue Post Debridement is: Fat layer exposed. Post procedure Diagnosis Wound #1: Same as Pre-Procedure Pre-procedure diagnosis of Wound #1 is a Venous Leg Ulcer located on the Left,Anterior Lower  Leg . There was a Three Layer Compression Therapy Procedure by Baruch Gouty, RN. Post procedure Diagnosis Wound #1: Same as Pre-Procedure Plan Follow-up Appointments: Return Appointment in 1 week. Bathing/ Shower/ Hygiene: May shower with protection but do not get wound dressing(s) wet. - use cast protector over wrap to protect in shower Edema Control - Lymphedema / SCD / Other: Elevate legs to the level of the heart or above for 30 minutes daily and/or when sitting, a frequency of: - throughout the day Avoid standing for long periods of time. Exercise regularly WOUND #1: - Lower Leg Wound Laterality: Left, Anterior Peri-Wound Care: Sween Lotion (Moisturizing lotion) 1 x Per Week/7 Days Discharge Instructions: Apply moisturizing lotion to leg Prim Dressing: Promogran Prisma Matrix, 4.34 (sq in) (silver collagen) 1 x Per Week/7 Days ary Discharge Instructions: Moisten collagen with saline or hydrogel Secondary Dressing: Woven Gauze Sponge, Non-Sterile 4x4 in 1 x Per Week/7 Days Discharge Instructions: Apply over primary dressing as directed. Compression Wrap: ThreePress (3 layer compression wrap) 1 x Per Week/7 Days Discharge Instructions: Apply three layer compression as directed. 1. Would recommend currently that we go ahead and initiate treatment with a 3 layer compression wrap I think that is can help with edema control which will in turn allow this wound to heal more effectively and quickly. 2. I am also can recommend a silver collagen dressing to help with the wound and stimulate new tissue growth. 3. I am also can recommend the patient continue to attempt to elevate her legs much as possible try to keep edema under good control. We will see patient back for reevaluation in 1 week here in the clinic. If anything worsens or changes patient will contact our office for additional recommendations. Electronic Signature(s) Signed: 11/21/2020 2:02:37 PM By: Worthy Keeler PA-C Entered  By: Worthy Keeler on 11/21/2020 14:02:36 -------------------------------------------------------------------------------- HxROS Details Patient Name: Date of Service: Cathy Gonzalez 11/21/2020 1:15 PM Medical Record Number: [id number] Patient Account Number: [id number] Date of Birth/Sex: Treating RN: 04-Oct-1946 (75 y.o. Female) Levan Hurst Primary Care Provider: [name] Other Clinician: Referring Provider: Treating Provider/Extender: [name] in Treatment: 0 Information Obtained From Patient Constitutional Symptoms (General Health) Complaints and Symptoms: Negative for: Fatigue; Fever; Chills; Marked Weight Change Eyes Complaints and Symptoms: Positive for: Glasses / Contacts - glasses Ear/Nose/Mouth/Throat Complaints and Symptoms: Negative for: Chronic sinus problems or rhinitis Genitourinary Complaints and Symptoms: Negative for: Frequent urination Integumentary (Skin)  Complaints and Symptoms: Positive for: Wounds - wound on left lower leg Psychiatric Complaints and Symptoms: Negative for: Claustrophobia; Suicidal Hematologic/Lymphatic Respiratory Medical History: Positive for: Sleep Apnea - wears cpap Cardiovascular Medical History: Positive for: Hypertension Past Medical History Notes: Hyperlipidemia Gastrointestinal Medical History: Positive for: Hepatitis C Endocrine Medical History: Positive for: Type II Diabetes Time with diabetes: 3 years Treated with: Oral agents Blood sugar tested every day: Yes Tested : 2-3 times a day Immunological Musculoskeletal Medical History: Positive for: Gout; Osteoarthritis Neurologic Medical History: Positive for: Neuropathy; Seizure Disorder - nocturnal seizures Oncologic Immunizations Pneumococcal Vaccine: Received Pneumococcal Vaccination: Yes Implantable Devices None Family and Social History Cancer: Yes - Siblings; Diabetes: Yes - Mother,Siblings; Heart Disease: Yes -  Mother; Hereditary Spherocytosis: No; Hypertension: No; Kidney Disease: No; Lung Disease: No; Seizures: No; Stroke: No; Thyroid Problems: No; Tuberculosis: No; Never smoker; Alcohol Use: Never; Drug Use: No History; Caffeine Use: Rarely; Financial Concerns: No; Food, Clothing or Shelter Needs: No; Support System Lacking: No; Transportation Concerns: No Electronic Signature(s) Signed: 11/21/2020 5:28:15 PM By: Worthy Keeler PA-C Signed: 11/23/2020 4:25:56 PM By: Levan Hurst RN, BSN Entered By: Levan Hurst on 11/21/2020 13:29:23 -------------------------------------------------------------------------------- Organ Details Patient Name: Date of Service: Cathy Gonzalez 11/21/2020 Medical Record Number: [id number] Patient Account Number: [id number] Date of Birth/Sex: Treating RN: 01-18-1946 (75 y.o. Female) Baruch Gouty Primary Care Provider: [name] Other Clinician: Referring Provider: Treating Provider/Extender: [name] in Treatment: 0 Diagnosis Coding ICD-10 Codes Code Description I87.2 Venous insufficiency (chronic) (peripheral) L97.822 Non-pressure chronic ulcer of other part of left lower leg with fat layer exposed E11.622 Type 2 diabetes mellitus with other skin ulcer E11.40 Type 2 diabetes mellitus with diabetic neuropathy, unspecified I10 Essential (primary) hypertension B18.2 Chronic viral hepatitis C Facility Procedures CPT4 Code: [id number] Description: 11941 - WOUND CARE VISIT-LEV 3 EST PT Modifier: 25 Quantity: 1 CPT4 Code: [id number] Description: 18563 - DEB SUBQ TISSUE 20 SQ CM/< ICD-10 Diagnosis Description L97.822 Non-pressure chronic ulcer of other part of left lower leg with fat layer expos Modifier: ed Quantity: 1 Physician Procedures : CPT4 Code Description Modifier [id number] WC PHYS LEVEL 3 NEW PT 25 ICD-10 Diagnosis Description I87.2 Venous insufficiency (chronic) (peripheral) L97.822 Non-pressure chronic ulcer of  other part of left lower leg with fat layer exposed E11.622 Type 2  diabetes mellitus with other skin ulcer E11.40 Type 2 diabetes mellitus with diabetic neuropathy, unspecified Quantity: 1 : [id number] 11042 - WC PHYS SUBQ TISS 20 SQ CM ICD-10 Diagnosis Description L97.822 Non-pressure chronic ulcer of other part of left lower leg with fat layer exposed Quantity: 1 Electronic Signature(s) Signed: 11/21/2020 2:02:54 PM By: Worthy Keeler PA-C Entered By: Worthy Keeler on 11/21/2020 14:02:53

## 2020-11-23 NOTE — Progress Notes (Signed)
AALIYA, MAULTSBY ([id number]) Visit Report for 11/21/2020 Allergy List Details Patient Name: Date of Service: Cathy Gonzalez 11/21/2020 1:15 PM Medical Record Number: [id number] Patient Account Number: [id number] Date of Birth/Sex: Treating RN: 13-Jan-1946 (75 y.o. Female) Levan Hurst Primary Care Jontavious Commons: [name] Other Clinician: Referring Rochell Puett: Treating Omnia Dollinger/Extender: [name] in Treatment: 0 Allergies Active Allergies No Known Drug Allergies Type: Allergen Allergy Notes Electronic Signature(s) Signed: 11/23/2020 4:25:56 PM By: Levan Hurst RN, BSN Entered By: Levan Hurst on 11/21/2020 13:18:10 -------------------------------------------------------------------------------- Arrival Information Details Patient Name: Date of Service: Cathy Gonzalez 11/21/2020 1:15 PM Medical Record Number: [id number] Patient Account Number: [id number] Date of Birth/Sex: Treating RN: Dec 26, 1945 (75 y.o. Female) Levan Hurst Primary Care Masoud Nyce: [name] Other Clinician: Referring Autym Siess: Treating Cailen Mihalik/Extender: Katherine Roan in Treatment: 0 Visit Information Patient Arrived: Ambulatory Arrival Time: 13:11 Accompanied By: alone Transfer Assistance: None Patient Identification Verified: Yes Secondary Verification Process Completed: Yes Patient Requires Transmission-Based Precautions: No Patient Has Alerts: No Electronic Signature(s) Signed: 11/23/2020 4:25:56 PM By: Levan Hurst RN, BSN Entered By: Levan Hurst on 11/21/2020 13:12:39 -------------------------------------------------------------------------------- Clinic Level of Care Assessment Details Patient Name: Date of Service: Cathy Gonzalez 11/21/2020 1:15 PM Medical Record Number: [id number] Patient Account Number: [id number] Date of Birth/Sex: Treating RN: [birth year] (75 y.o. Female) Baruch Gouty Primary Care Flor Whitacre: [name] Other  Clinician: Referring Courage Biglow: Treating Keilah Lemire/Extender: [name] in Treatment: 0 Clinic Level of Care Assessment Items TOOL 1 Quantity Score []  - 0 Use when EandM and Procedure is performed on INITIAL visit ASSESSMENTS - Nursing Assessment / Reassessment X- 1 20 General Physical Exam (combine w/ comprehensive assessment (listed just below) when performed on new pt. evals) X- 1 25 Comprehensive Assessment (HX, ROS, Risk Assessments, Wounds Hx, etc.) ASSESSMENTS - Wound and Skin Assessment / Reassessment []  - 0 Dermatologic / Skin Assessment (not related to wound area) ASSESSMENTS - Ostomy and/or Continence Assessment and Care []  - 0 Incontinence Assessment and Management []  - 0 Ostomy Care Assessment and Management (repouching, etc.) PROCESS - Coordination of Care X - Simple Patient / Family Education for ongoing care 1 15 []  - 0 Complex (extensive) Patient / Family Education for ongoing care X- 1 10 Staff obtains Programmer, systems, Records, T Results / Process Orders est []  - 0 Staff telephones HHA, Nursing Homes / Clarify orders / etc []  - 0 Routine Transfer to another Facility (non-emergent condition) []  - 0 Routine Hospital Admission (non-emergent condition) X- 1 15 New Admissions / Biomedical engineer / Ordering NPWT Apligraf, etc. , []  - 0 Emergency Hospital Admission (emergent condition) PROCESS - Special Needs []  - 0 Pediatric / Minor Patient Management []  - 0 Isolation Patient Management []  - 0 Hearing / Language / Visual special needs []  - 0 Assessment of Community assistance (transportation, D/C planning, etc.) []  - 0 Additional assistance / Altered mentation []  - 0 Support Surface(s) Assessment (bed, cushion, seat, etc.) INTERVENTIONS - Miscellaneous []  - 0 External ear exam []  - 0 Patient Transfer (multiple staff / Civil Service fast streamer / Similar devices) []  - 0 Simple Staple / Suture removal (25 or less) []  - 0 Complex Staple /  Suture removal (26 or more) []  - 0 Hypo/Hyperglycemic Management (do not check if billed separately) X- 1 15 Ankle / Brachial Index (ABI) - do not check if billed separately Has the patient been seen at the hospital  within the last three years: Yes Total Score: 100 Level Of Care: New/Established - Level 3 Electronic Signature(s) Signed: 11/21/2020 6:01:36 PM By: Baruch Gouty RN, BSN Entered By: Baruch Gouty on 11/21/2020 13:52:20 -------------------------------------------------------------------------------- Compression Therapy Details Patient Name: Date of Service: Cathy Gonzalez. 11/21/2020 1:15 PM Medical Record Number: [id number] Patient Account Number: [id number] Date of Birth/Sex: Treating RN: 03-12-1946 (75 y.o. Female) Baruch Gouty Primary Care Keysi Oelkers: [name] Other Clinician: Referring Magon Croson: Treating Nasya Vincent/Extender: [name] in Treatment: 0 Compression Therapy Performed for Wound Assessment: Wound #1 Left,Anterior Lower Leg Performed By: Clinician Baruch Gouty, RN Compression Type: Three Layer Post Procedure Diagnosis Same as Pre-procedure Electronic Signature(s) Signed: 11/21/2020 6:01:36 PM By: Baruch Gouty RN, BSN Entered By: Baruch Gouty on 11/21/2020 13:57:02 -------------------------------------------------------------------------------- Encounter Discharge Information Details Patient Name: Date of Service: Cathy Gonzalez 11/21/2020 1:15 PM Medical Record Number: [id number] Patient Account Number: [id number] Date of Birth/Sex: Treating RN: 1945-12-30 (75 y.o. Female) Rhae Hammock Primary Care Constanza Mincy: [name] Other Clinician: Referring Zamani Crocker: Treating Safina Huard/Extender: Katherine Roan in Treatment: 0 Encounter Discharge Information Items Post Procedure Vitals Discharge Condition: Stable Temperature (F): 98.7 Ambulatory Status: Ambulatory Pulse (bpm):  81 Discharge Destination: Home Respiratory Rate (breaths/min): 17 Transportation: Private Auto Blood Pressure (mmHg): 124/74 Accompanied By: self Schedule Follow-up Appointment: Yes Clinical Summary of Care: Patient Declined Electronic Signature(s) Signed: 11/23/2020 5:09:47 PM By: Rhae Hammock RN Entered By: Rhae Hammock on 11/21/2020 14:02:27 -------------------------------------------------------------------------------- Lower Extremity Assessment Details Patient Name: Date of Service: Cathy Gonzalez 11/21/2020 1:15 PM Medical Record Number: [id number] Patient Account Number: [id number] Date of Birth/Sex: Treating RN: December 28, 1945 (75 y.o. Female) Levan Hurst Primary Care Keaghan Bowens: [name] Other Clinician: Referring Mercia Dowe: Treating Rayce Brahmbhatt/Extender: [name] in Treatment: 0 Edema Assessment Assessed: [Left: No] [Right: No] Edema: [Left: Ye] [Right: s] Calf Left: Right: Point of Measurement: 33 cm From Medial Instep 40.8 cm Ankle Left: Right: Point of Measurement: 9 cm From Medial Instep 25.3 cm Knee To Floor Left: Right: From Medial Instep 45 cm Vascular Assessment Pulses: Dorsalis Pedis Palpable: [Left:Yes] Blood Pressure: Brachial: [Left:139] Ankle: [Left:Dorsalis Pedis: 148 1.06] Electronic Signature(s) Signed: 11/23/2020 4:25:56 PM By: Levan Hurst RN, BSN Entered By: Levan Hurst on 11/21/2020 13:33:01 -------------------------------------------------------------------------------- [place] Details Patient Name: Date of Service: Cathy Gonzalez. 11/21/2020 1:15 PM Medical Record Number: [id number] Patient Account Number: [id number] Date of Birth/Sex: Treating RN: [birth year] (75 y.o. Female) Baruch Gouty Primary Care Maille Halliwell: [name] Other Clinician: Referring Dreshaun Stene: Treating Lonzell Dorris/Extender: [name] in Treatment: 0 Active  Inactive Nutrition Nursing Diagnoses: Impaired glucose control: actual or potential Potential for alteratiion in Nutrition/Potential for imbalanced nutrition Goals: Patient/caregiver will maintain therapeutic glucose control Date Initiated: 11/21/2020 Target Resolution Date: 12/19/2020 Goal Status: Active Interventions: Assess patient nutrition upon admission and as needed per policy Provide education on elevated blood sugars and impact on wound healing Treatment Activities: Patient referred to Primary Care Physician for further nutritional evaluation : 11/21/2020 Notes: Venous Leg Ulcer Nursing Diagnoses: Knowledge deficit related to disease process and management Potential for venous Insuffiency (use before diagnosis confirmed) Goals: Patient will maintain optimal edema control Date Initiated: 11/21/2020 Target Resolution Date: 12/19/2020 Goal Status: Active Interventions: Assess peripheral edema status every visit. Compression as ordered Provide education on venous insufficiency Treatment Activities: Therapeutic compression applied : 11/21/2020 Notes: Wound/Skin Impairment Nursing Diagnoses: Impaired  tissue integrity Knowledge deficit related to ulceration/compromised skin integrity Goals: Patient/caregiver will verbalize understanding of skin care regimen Date Initiated: 11/21/2020 Target Resolution Date: 12/19/2020 Goal Status: Active Ulcer/skin breakdown will have a volume reduction of 30% by week 4 Date Initiated: 11/21/2020 Target Resolution Date: 12/19/2020 Goal Status: Active Interventions: Assess patient/caregiver ability to obtain necessary supplies Assess patient/caregiver ability to perform ulcer/skin care regimen upon admission and as needed Assess ulceration(s) every visit Treatment Activities: Skin care regimen initiated : 11/21/2020 Topical wound management initiated : 11/21/2020 Notes: Electronic Signature(s) Signed: 11/21/2020 6:01:36 PM By: Baruch Gouty RN, BSN Entered By: Baruch Gouty on 11/21/2020 13:41:16 -------------------------------------------------------------------------------- Pain Assessment Details Patient Name: Date of Service: Cathy Gonzalez. 11/21/2020 1:15 PM Medical Record Number: [id number] Patient Account Number: [id number] Date of Birth/Sex: Treating RN: [birth year] (75 y.o. Female) Levan Hurst Primary Care Cabrina Shiroma: [name] Other Clinician: Referring Jayshawn Colston: Treating Yonathan Perrow/Extender: [name] in Treatment: 0 Active Problems Location of Pain Severity and Description of Pain Patient Has Paino No Site Locations Pain Management and Medication Current Pain Management: Electronic Signature(s) Signed: 11/23/2020 4:25:56 PM By: Levan Hurst RN, BSN Entered By: Levan Hurst on 11/21/2020 13:35:09 -------------------------------------------------------------------------------- Patient/Caregiver Education Details Patient Name: Date of Service: Cathy Gonzalez 2/16/2022andnbsp1:15 PM Medical Record Number: [id number] Patient Account Number: [id number] Date of Birth/Gender: Treating RN: [birth year] (75 y.o. Female) Baruch Gouty Primary Care Physician: [name] Other Clinician: Referring Physician: Treating Physician/Extender: Katherine Roan in Treatment: 0 Education Assessment Education Provided To: Patient Education Topics Provided Elevated Blood Sugar/ Impact on Healing: Handouts: Elevated Blood Sugars: How Do They Affect Wound Healing Methods: Explain/Verbal, Printed Responses: Reinforcements needed, State content correctly [place]: o Handouts: Welcome T [facility] [place] o Methods: Explain/Verbal, Printed Responses: Reinforcements needed, State content correctly Wound/Skin Impairment: Handouts: Caring for Your Ulcer, Skin Care Do's and Dont's Methods: Explain/Verbal, Printed Responses:  Reinforcements needed, State content correctly Electronic Signature(s) Signed: 11/21/2020 6:01:36 PM By: Baruch Gouty RN, BSN Entered By: Baruch Gouty on 11/21/2020 13:51:40 -------------------------------------------------------------------------------- Wound Assessment Details Patient Name: Date of Service: Cathy Gonzalez. 11/21/2020 1:15 PM Medical Record Number: [id number] Patient Account Number: [id number] Date of Birth/Sex: Treating RN: 03-Jan-1946 (75 y.o. Female) Levan Hurst Primary Care Amiel Mccaffrey: [name] Other Clinician: Referring Victorious Cosio: Treating Ammara Raj/Extender: [name] in Treatment: 0 Wound Status Wound Number: 1 Primary Venous Leg Ulcer Etiology: Wound Location: Left, Anterior Lower Leg Wound Open Wounding Event: Gradually Appeared Status: Date Acquired: 10/07/2019 Comorbid Sleep Apnea, Hypertension, Hepatitis C, Type II Diabetes, Gout, Weeks Of Treatment: 0 History: Osteoarthritis, Neuropathy, Seizure Disorder Clustered Wound: No Photos Photo Uploaded By: Mikeal Hawthorne on 11/22/2020 11:27:37 Wound Measurements Length: (cm) 0.4 Width: (cm) 0.5 Depth: (cm) 0.1 Area: (cm) 0.157 Volume: (cm) 0.016 % Reduction in Area: % Reduction in Volume: Epithelialization: None Tunneling: No Undermining: No Wound Description Classification: Unclassifiable Wound Margin: Flat and Intact Exudate Amount: Medium Exudate Type: Serous Exudate Color: amber Foul Odor After Cleansing: No Slough/Fibrino Yes Wound Bed Granulation Amount: None Present (0%) Exposed Structure Necrotic Amount: Large (67-100%) Fascia Exposed: No Necrotic Quality: Adherent Slough Fat Layer (Subcutaneous Tissue) Exposed: No Tendon Exposed: No Muscle Exposed: No Joint Exposed: No Bone Exposed: No Treatment Notes Wound #1 (Lower Leg) Wound Laterality: Left, Anterior Cleanser Peri-Wound Care Sween Lotion (Moisturizing lotion) Discharge  Instruction: Apply moisturizing lotion to leg Topical Primary Dressing  Promogran Prisma Matrix, 4.34 (sq in) (silver collagen) Discharge Instruction: Moisten collagen with saline or hydrogel Secondary Dressing Woven Gauze Sponge, Non-Sterile 4x4 in Discharge Instruction: Apply over primary dressing as directed. Secured With Compression Wrap ThreePress (3 layer compression wrap) Discharge Instruction: Apply three layer compression as directed. Compression Stockings Add-Ons Electronic Signature(s) Signed: 11/23/2020 4:25:56 PM By: Levan Hurst RN, BSN Entered By: Levan Hurst on 11/21/2020 13:34:19 -------------------------------------------------------------------------------- [place] Details Patient Name: Date of Service: Cathy Gonzalez 11/21/2020 1:15 PM Medical Record Number: [id number] Patient Account Number: [id number] Date of Birth/Sex: Treating RN: 1946/05/24 (75 y.o. Female) Levan Hurst Primary Care Vipul Cafarelli: [name] Other Clinician: Referring Oria Klimas: Treating Naleigha Raimondi/Extender: [name] in Treatment: 0 Vital Signs Time Taken: 13:12 Temperature (F): 98.7 Height (in): 64 Pulse (bpm): 81 Source: Stated Respiratory Rate (breaths/min): 18 Weight (lbs): 225 Blood Pressure (mmHg): 139/77 Source: Stated Capillary Blood Glucose (mg/dl): 112 Body Mass Index (BMI): 38.6 Reference Range: 80 - 120 mg / dl Notes glucose per pt report Electronic Signature(s) Signed: 11/23/2020 4:25:56 PM By: Levan Hurst RN, BSN Entered By: Levan Hurst on 11/21/2020 13:16:45

## 2020-11-27 ENCOUNTER — Ambulatory Visit: Payer: HMO | Admitting: Podiatry

## 2020-11-27 ENCOUNTER — Other Ambulatory Visit: Payer: Self-pay

## 2020-11-27 ENCOUNTER — Encounter: Payer: Self-pay | Admitting: Podiatry

## 2020-11-27 DIAGNOSIS — M79675 Pain in left toe(s): Secondary | ICD-10-CM | POA: Diagnosis not present

## 2020-11-27 DIAGNOSIS — B351 Tinea unguium: Secondary | ICD-10-CM | POA: Diagnosis not present

## 2020-11-27 DIAGNOSIS — E1142 Type 2 diabetes mellitus with diabetic polyneuropathy: Secondary | ICD-10-CM | POA: Diagnosis not present

## 2020-11-27 DIAGNOSIS — M79674 Pain in right toe(s): Secondary | ICD-10-CM

## 2020-11-27 NOTE — Progress Notes (Signed)
She presents today chief complaint of painful elongated toenails.  Objective: Toenails are long thick yellow dystrophic-like mycotic no open lesions or wounds to the foot.  Pulses remain palpable.  Assessment: Pain limiting to onychomycosis.  Plan: Debridement of toenails bilateral.

## 2020-11-28 ENCOUNTER — Encounter (HOSPITAL_BASED_OUTPATIENT_CLINIC_OR_DEPARTMENT_OTHER): Payer: HMO | Admitting: Physician Assistant

## 2020-11-28 DIAGNOSIS — I872 Venous insufficiency (chronic) (peripheral): Secondary | ICD-10-CM | POA: Diagnosis not present

## 2020-11-28 DIAGNOSIS — L97822 Non-pressure chronic ulcer of other part of left lower leg with fat layer exposed: Secondary | ICD-10-CM | POA: Diagnosis not present

## 2020-11-28 DIAGNOSIS — E11622 Type 2 diabetes mellitus with other skin ulcer: Secondary | ICD-10-CM | POA: Diagnosis not present

## 2020-11-29 NOTE — Progress Notes (Addendum)
SHELISA, FERN ([id number]) Visit Report for 11/28/2020 Chief Complaint Document Details Patient Name: Date of Service: Horatio Pel 11/28/2020 12:30 PM Medical Record Number: [id number] Patient Account Number: [id number] Date of Birth/Sex: Treating RN: 1946-02-25 (75 y.o. [name] Primary Care Provider: [name] Other Clinician: Referring Provider: Treating Provider/Extender: [name] in Treatment: 1 Information Obtained from: Patient Chief Complaint Left LE Ulcer Electronic Signature(s) Signed: 11/28/2020 12:54:08 PM By: Worthy Keeler PA-C Entered By: Worthy Keeler on 11/28/2020 12:54:08 -------------------------------------------------------------------------------- Problem List Details Patient Name: Date of Service: Horatio Pel 11/28/2020 12:30 PM Medical Record Number: [id number] Patient Account Number: [id number] Date of Birth/Sex: Treating RN: 1945-12-14 (32 y.o. [name] Primary Care Provider: [name] Other Clinician: Referring Provider: Treating Provider/Extender: [name] in Treatment: 1 Active Problems ICD-10 Encounter Code Description Active Date MDM Diagnosis I87.2 Venous insufficiency (chronic) (peripheral) 11/21/2020 No Yes L97.822 Non-pressure chronic ulcer of other part of left lower leg with fat layer exposed2/16/2022 No Yes E11.622 Type 2 diabetes mellitus with other skin ulcer 11/21/2020 No Yes E11.40 Type 2 diabetes mellitus with diabetic neuropathy, unspecified 11/21/2020 No Yes I10 Essential (primary) hypertension 11/21/2020 No Yes B18.2 Chronic viral hepatitis C 11/21/2020 No Yes Inactive Problems Resolved Problems Electronic Signature(s) Signed: 11/28/2020 12:54:02 PM By: Worthy Keeler PA-C Entered By: Worthy Keeler on 11/28/2020 12:54:02

## 2020-12-03 NOTE — Progress Notes (Signed)
BAKER, KOGLER ([id number]) Visit Report for 11/28/2020 Arrival Information Details Patient Name: Date of Service: Cathy Gonzalez 11/28/2020 12:30 PM Medical Record Number: [id number] Patient Account Number: [id number] Date of Birth/Sex: Treating RN: Feb 08, 1946 (75 y.o. [name] Primary Care Jarmaine Ehrler: [name] Other Clinician: Referring Marieta Markov: Treating Jonalyn Sedlak/Extender: Katherine Roan in Treatment: 1 Visit Information History Since Last Visit Added or deleted any medications: No Patient Arrived: Ambulatory Any new allergies or adverse reactions: No Arrival Time: 12:39 Had a fall or experienced change in No Accompanied By: alone activities of daily living that may affect Transfer Assistance: None risk of falls: Patient Identification Verified: Yes Signs or symptoms of abuse/neglect since last visito No Secondary Verification Process Completed: Yes Hospitalized since last visit: No Patient Requires Transmission-Based Precautions: No Implantable device outside of the clinic excluding No Patient Has Alerts: No cellular tissue based products placed in the center since last visit: Has Dressing in Place as Prescribed: Yes Has Compression in Place as Prescribed: Yes Pain Present Now: No Electronic Signature(s) Signed: 12/03/2020 5:56:44 PM By: Levan Hurst RN, BSN Entered By: Levan Hurst on 11/28/2020 12:39:27 -------------------------------------------------------------------------------- Compression Therapy Details Patient Name: Date of Service: Cathy Gonzalez. 11/28/2020 12:30 PM Medical Record Number: [id number] Patient Account Number: [id number] Date of Birth/Sex: Treating RN: 12-07-1945 (75 y.o. [name] Primary Care Tenille Morrill: [name] Other Clinician: Referring Tela Kotecki: Treating Jackalyn Haith/Extender: [name] in Treatment: 1 Compression Therapy Performed for Wound Assessment: Wound #1  Left,Anterior Lower Leg Performed By: Clinician Rhae Hammock, RN Compression Type: Three Layer Post Procedure Diagnosis Same as Pre-procedure Electronic Signature(s) Signed: 11/28/2020 5:21:35 PM By: Baruch Gouty RN, BSN Entered By: Baruch Gouty on 11/28/2020 13:07:35 -------------------------------------------------------------------------------- Encounter Discharge Information Details Patient Name: Date of Service: Cathy Gonzalez 11/28/2020 12:30 PM Medical Record Number: [id number] Patient Account Number: [id number] Date of Birth/Sex: Treating RN: [birth year] (75 y.o. [name], [name] Primary Care Rain Friedt: [name] Other Clinician: Referring Elmus Mathes: Treating [place]/Extender: Katherine Roan in Treatment: 1 Encounter Discharge Information Items Discharge Condition: Stable Ambulatory Status: Ambulatory Discharge Destination: Home Transportation: Private Auto Accompanied By: self Schedule Follow-up Appointment: Yes Clinical Summary of Care: Patient Declined Electronic Signature(s) Signed: 11/28/2020 5:04:01 PM By: Rhae Hammock RN Entered By: Rhae Hammock on 11/28/2020 13:09:09 -------------------------------------------------------------------------------- Lower Extremity Assessment Details Patient Name: Date of Service: Cathy Gonzalez. 11/28/2020 12:30 PM Medical Record Number: [id number] Patient Account Number: [id number] Date of Birth/Sex: Treating RN: 01-09-1946 (75 y.o. [name] Primary Care Ranell Skibinski: [name] Other Clinician: Referring Cullen Lahaie: Treating Alisandra Son/Extender: [name] in Treatment: 1 Edema Assessment Assessed: [Left: No] [Right: No] Edema: [Left: Ye] [Right: s] Calf Left: Right: Point of Measurement: 33 cm From Medial Instep 40 cm Ankle Left: Right: Point of Measurement: 9 cm From Medial Instep 24 cm Electronic Signature(s) Signed: 12/03/2020  5:56:44 PM By: Levan Hurst RN, BSN Entered By: Levan Hurst on 11/28/2020 12:47:31 -------------------------------------------------------------------------------- [place] Details Patient Name: Date of Service: Cathy Gonzalez. 11/28/2020 12:30 PM Medical Record Number: [id number] Patient Account Number: [id number] Date of Birth/Sex: Treating RN: 10-Nov-1945 (75 y.o. [name] Primary Care Azim Gillingham: [name] Other Clinician: Referring Matayah Reyburn: Treating Kainoah Bartosiewicz/Extender: Katherine Roan in Treatment: 1 Multidisciplinary Care Plan reviewed with physician Active Inactive Nutrition Nursing Diagnoses: Impaired glucose control: actual  or potential Potential for alteratiion in Nutrition/Potential for imbalanced nutrition Goals: Patient/caregiver will maintain therapeutic glucose control Date Initiated: 11/21/2020 Target Resolution Date: 12/19/2020 Goal Status: Active Interventions: Assess patient nutrition upon admission and as needed per policy Provide education on elevated blood sugars and impact on wound healing Treatment Activities: Patient referred to Primary Care Physician for further nutritional evaluation : 11/21/2020 Notes: Venous Leg Ulcer Nursing Diagnoses: Knowledge deficit related to disease process and management Potential for venous Insuffiency (use before diagnosis confirmed) Goals: Patient will maintain optimal edema control Date Initiated: 11/21/2020 Target Resolution Date: 12/19/2020 Goal Status: Active Interventions: Assess peripheral edema status every visit. Compression as ordered Provide education on venous insufficiency Treatment Activities: Therapeutic compression applied : 11/21/2020 Notes: Wound/Skin Impairment Nursing Diagnoses: Impaired tissue integrity Knowledge deficit related to ulceration/compromised skin integrity Goals: Patient/caregiver will verbalize understanding of skin care  regimen Date Initiated: 11/21/2020 Target Resolution Date: 12/19/2020 Goal Status: Active Ulcer/skin breakdown will have a volume reduction of 30% by week 4 Date Initiated: 11/21/2020 Target Resolution Date: 12/19/2020 Goal Status: Active Interventions: Assess patient/caregiver ability to obtain necessary supplies Assess patient/caregiver ability to perform ulcer/skin care regimen upon admission and as needed Assess ulceration(s) every visit Treatment Activities: Skin care regimen initiated : 11/21/2020 Topical wound management initiated : 11/21/2020 Notes: Electronic Signature(s) Signed: 11/28/2020 5:21:35 PM By: Baruch Gouty RN, BSN Entered By: Baruch Gouty on 11/28/2020 12:38:35 -------------------------------------------------------------------------------- Pain Assessment Details Patient Name: Date of Service: Cathy Gonzalez. 11/28/2020 12:30 PM Medical Record Number: [id number] Patient Account Number: [id number] Date of Birth/Sex: Treating RN: Aug 17, 1946 (75 y.o. [name] Primary Care [place]: [name] Other Clinician: Referring Rhapsody Wolven: Treating Lita Flynn/Extender: [name] in Treatment: 1 Active Problems Location of Pain Severity and Description of Pain Patient Has Paino No Site Locations Pain Management and Medication Current Pain Management: Electronic Signature(s) Signed: 12/03/2020 5:56:44 PM By: Levan Hurst RN, BSN Entered By: Levan Hurst on 11/28/2020 12:47:23 -------------------------------------------------------------------------------- Patient/Caregiver Education Details Patient Name: Date of Service: Cathy Gonzalez 2/23/2022andnbsp12:30 PM Medical Record Number: [id number] Patient Account Number: [id number] Date of Birth/Gender: Treating RN: 1945/12/09 (54 y.o. [name] Primary Care Physician: [name] Other Clinician: Referring Physician: Treating Physician/Extender: Katherine Roan in Treatment: 1 Education Assessment Education Provided To: Patient Education Topics Provided Elevated Blood Sugar/ Impact on Healing: Methods: Explain/Verbal Responses: Reinforcements needed, State content correctly Venous: Methods: Explain/Verbal Responses: Reinforcements needed, State content correctly Electronic Signature(s) Signed: 11/28/2020 5:21:35 PM By: Baruch Gouty RN, BSN Entered By: Baruch Gouty on 11/28/2020 12:39:03 -------------------------------------------------------------------------------- Wound Assessment Details Patient Name: Date of Service: Cathy Gonzalez. 11/28/2020 12:30 PM Medical Record Number: [id number] Patient Account Number: [id number] Date of Birth/Sex: Treating RN: 1945/10/09 (75 y.o. [name] Primary Care Tayllor Breitenstein: [name] Other Clinician: Referring Diezel Mazur: Treating Kylani Wires/Extender: [name] in Treatment: 1 Wound Status Wound Number: 1 Primary Venous Leg Ulcer Etiology: Wound Location: Left, Anterior Lower Leg Wound Open Wounding Event: Gradually Appeared Status: Date Acquired: 10/07/2019 Comorbid Sleep Apnea, Hypertension, Hepatitis C, Type II Diabetes, Gout, Weeks Of Treatment: 1 History: Osteoarthritis, Neuropathy, Seizure Disorder Clustered Wound: No Photos Photo Uploaded By: Mikeal Hawthorne on 11/30/2020 14:32:29 Wound Measurements Length: (cm) 0.4 Width: (cm) 0.5 Depth: (cm) 0.1 Area: (cm) 0.157 Volume: (cm) 0.016 % Reduction in Area: 0% % Reduction in Volume: 0% Epithelialization: Small (1-33%) Tunneling: No Undermining: No Wound Description Classification: Full Thickness  Without Exposed Support Structures Wound Margin: Flat and Intact Exudate Amount: Small Exudate Type: Serosanguineous Exudate Color: red, brown Foul Odor After Cleansing: No Slough/Fibrino Yes Wound Bed Granulation Amount: Large (67-100%) Exposed Structure Granulation  Quality: Pink Fascia Exposed: No Necrotic Amount: None Present (0%) Fat Layer (Subcutaneous Tissue) Exposed: Yes Tendon Exposed: No Muscle Exposed: No Joint Exposed: No Bone Exposed: No Treatment Notes Wound #1 (Lower Leg) Wound Laterality: Left, Anterior Cleanser Peri-Wound Care Sween Lotion (Moisturizing lotion) Discharge Instruction: Apply moisturizing lotion to leg Topical Primary Dressing Promogran Prisma Matrix, 4.34 (sq in) (silver collagen) Discharge Instruction: Moisten collagen with saline or hydrogel Secondary Dressing Woven Gauze Sponge, Non-Sterile 4x4 in Discharge Instruction: Apply over primary dressing as directed. Secured With Compression Wrap ThreePress (3 layer compression wrap) Discharge Instruction: Apply three layer compression as directed. Compression Stockings Add-Ons Electronic Signature(s) Signed: 12/03/2020 5:56:44 PM By: Levan Hurst RN, BSN Entered By: Levan Hurst on 11/28/2020 12:47:52 -------------------------------------------------------------------------------- [place] Details Patient Name: Date of Service: Cathy Gonzalez. 11/28/2020 12:30 PM Medical Record Number: [id number] Patient Account Number: [id number] Date of Birth/Sex: Treating RN: [birth year] (74 y.o. [name] Primary Care Voncille Simm: [name] Other Clinician: Referring Hanson Medeiros: Treating Almee Pelphrey/Extender: [name] in Treatment: 1 Vital Signs Time Taken: 12:41 Temperature (F): 98.1 Height (in): 64 Pulse (bpm): 77 Weight (lbs): 225 Respiratory Rate (breaths/min): 18 Body Mass Index (BMI): 38.6 Blood Pressure (mmHg): 124/67 Capillary Blood Glucose (mg/dl): 96 Reference Range: 80 - 120 mg / dl Notes glucose per pt report Electronic Signature(s) Signed: 12/03/2020 5:56:44 PM By: Levan Hurst RN, BSN Entered By: Levan Hurst on 11/28/2020 12:41:33

## 2020-12-05 ENCOUNTER — Encounter (HOSPITAL_BASED_OUTPATIENT_CLINIC_OR_DEPARTMENT_OTHER): Payer: HMO | Admitting: Physician Assistant

## 2020-12-07 ENCOUNTER — Encounter (HOSPITAL_BASED_OUTPATIENT_CLINIC_OR_DEPARTMENT_OTHER): Payer: HMO | Admitting: Internal Medicine

## 2020-12-09 NOTE — Progress Notes (Signed)
Cardiology Office Note:   Date:  12/10/2020  NAME:  Cathy Gonzalez    MRN: [id number] DOB:  08-May-1946   PCP:  [name], MD  Cardiologist:  No primary care provider on file.   Referring MD: [name], MD   Chief Complaint  Patient presents with  . Follow-up   History of Present Illness:   Cathy Gonzalez is a 75 y.o. female with a hx of DM, obesity, HTN, HLD, OSA who presents for follow-up. Seen several months ago for palpitations related to PACs. Also crestor added for HLD.  She reports she has not been able to tolerate Crestor.  This made her fatigued.  Her blood pressure is 122/80.  Seems to be doing well on current medications.  She is tolerating Jardiance well.  She is due for repeat A1c checked by her primary care physician.  Her LDL cholesterol not quite at goal we will see what her updated values are when she sees her primary care physician.  She reports that she can still get short of breath with exertion.  I encouraged her to remain more active.  Her work-up showed her BNP was normal.  Echocardiogram with a collapsing IVC and grade 1 diastolic dysfunction.  This is easily treated with diabetes control and blood pressure control.  She is using her sleep apnea machine.  Denies any chest pain symptoms today.  Her weights have gone up she reports she is working on this.  She also is working with the wound doctor.  Has a wound on the left lower extremity.  She is having no further palpitations.    Problem List 1. Obesity 2. OSA 3. Diabetes -A1c 6.5 4. HTN 5. HLD -Total cholesterol 192, LDL 122, HDL 52, triglycerides 81 6. PACs -<1% -EF 60-65%, G1DD  Past Medical History: Past Medical History:  Diagnosis Date  . Arthritis   . Cataract    left cataract removed in 2017  . Diabetes mellitus   . Diabetic neuropathy ([place])   . Diabetic neuropathy, type II diabetes mellitus ([place])   . Hepatitis C    took tx for   . Hypertension   . Hypokalemia   . Knee pain    right   .  Morbidly obese ([place])   . Nocturnal seizures ([place]) 05/14/2015   no bad seizure since 1980's  . OSA on CPAP    uses cpap 2-3 week  . Plantar fasciitis    both feet    Past Surgical History: Past Surgical History:  Procedure Laterality Date  . ABDOMINAL HYSTERECTOMY     partial  . CATARACT EXTRACTION Left   . COLONOSCOPY WITH PROPOFOL N/A 08/21/2015   Procedure: COLONOSCOPY WITH PROPOFOL;  Surgeon: Carol Ada, MD;  Location: [facility];  Service: Endoscopy;  Laterality: N/A;  . ESOPHAGOGASTRODUODENOSCOPY (EGD) WITH PROPOFOL N/A 08/21/2015   Procedure: ESOPHAGOGASTRODUODENOSCOPY (EGD) WITH PROPOFOL;  Surgeon: Carol Ada, MD;  Location: [facility];  Service: Endoscopy;  Laterality: N/A;  . ESOPHAGOGASTRODUODENOSCOPY (EGD) WITH PROPOFOL N/A 10/08/2018   Procedure: ESOPHAGOGASTRODUODENOSCOPY (EGD) WITH PROPOFOL;  Surgeon: Carol Ada, MD;  Location: [facility];  Service: Endoscopy;  Laterality: N/A;  . EYE SURGERY Bilateral    cataracts    Current Medications: Current Meds  Medication Sig  . acetaminophen (TYLENOL) 500 MG tablet Take 500 mg by mouth every 6 (six) hours as needed for moderate pain.  [name] amLODipine (NORVASC) 10 MG tablet Take 10 mg by mouth daily. Takes half  . Blood Glucose Monitoring  Suppl (ONE TOUCH ULTRA 2) w/Device KIT 2 (two) times daily. as directed  . colchicine 0.6 MG tablet Take 0.6 mg by mouth as needed (gout).   [name] diclofenac Sodium (VOLTAREN) 1 % GEL SMARTSIG:2 Inch(es) Topical 4 Times Daily  . ezetimibe (ZETIA) 10 MG tablet Take 10 mg by mouth at bedtime.   [name] FIASP FLEXTOUCH 100 UNIT/ML SOPN Inject 4-15 Units into the skin 3 (three) times daily with meals.   . furosemide (LASIX) 40 MG tablet Take 40 mg by mouth daily.   [name] levETIRAcetam (KEPPRA) 750 MG tablet Take 2 tablets by mouth twice daily  . metFORMIN (GLUCOPHAGE) 500 MG tablet Take 500 mg by mouth at bedtime.   . montelukast (SINGULAIR) 10 MG tablet Take 10 mg by mouth at bedtime.  . ONE TOUCH  ULTRA TEST test strip   . ONETOUCH DELICA LANCETS 44Y MISC   . Potassium Chloride ER 20 MEQ TBCR Take 1 tablet by mouth 2 (two) times daily.  . pregabalin (LYRICA) 300 MG capsule Take 1 capsule (300 mg total) by mouth 2 (two) times daily.  . sucralfate (CARAFATE) 1 G tablet Take 1 g by mouth 2 (two) times daily.   [name] telmisartan (MICARDIS) 80 MG tablet Take 80 mg by mouth daily.   . [DISCONTINUED] empagliflozin (JARDIANCE) 10 MG TABS tablet Take 1 tablet (10 mg total) by mouth daily before breakfast.    Allergies:    Patient has no known allergies.   Social History: Social History   Socioeconomic History  . Marital status: Single    Spouse name: Not on file  . Number of children: 4  . Years of education: 16  . Highest education level: Not on file  Occupational History  . Occupation: [employer]  Tobacco Use  . Smoking status: Never Smoker  . Smokeless tobacco: Never Used  Vaping Use  . Vaping Use: Never used  Substance and Sexual Activity  . Alcohol use: No    Alcohol/week: 0.0 standard drinks  . Drug use: No  . Sexual activity: Never  Other Topics Concern  . Not on file  Social History Narrative   Patient occasionally drinks caffeine.   Patient is right handed.   Social Determinants of Health   Financial Resource Strain: Not on file  Food Insecurity: Food Insecurity Present  . Worried About Charity fundraiser in the Last Year: Sometimes true  . Ran Out of Food in the Last Year: Never true  Transportation Needs: No Transportation Needs  . Lack of Transportation (Medical): No  . Lack of Transportation (Non-Medical): No  Physical Activity: Not on file  Stress: Not on file  Social Connections: Not on file    Family History: The patient's family history includes Breast cancer in her sister; Cancer in her sister; Diabetes in her brother, mother, and sister; Heart attack in her mother. There is no history of Seizures.  ROS:   All other ROS reviewed and negative.  Pertinent positives noted in the HPI.     EKGs/Labs/Other Studies Reviewed:   The following studies were personally reviewed by me today:  TTE 10/08/2020 1. Left ventricular ejection fraction, by estimation, is 60 to 65%. The  left ventricle has normal function. The left ventricle has no regional  wall motion abnormalities. Left ventricular diastolic parameters are  consistent with Grade I diastolic  dysfunction (impaired relaxation).  2. Right ventricular systolic function is normal. The right ventricular  size is normal.  3. The mitral valve is  normal in structure. No evidence of mitral valve  regurgitation. No evidence of mitral stenosis.  4. The aortic valve is tricuspid. Aortic valve regurgitation is not  visualized. Mild aortic valve sclerosis is present, with no evidence of  aortic valve stenosis.  5. The inferior vena cava is normal in size with greater than 50%  respiratory variability, suggesting right atrial pressure of 3 mmHg.   [place] 09/11/2020 1. No significant arrhythmias.  2. Symptoms occurred with PACs which were rare (<1%).   Recent Labs: 12/26/2019: ALT 42 09/10/2020: BNP 4.2; TSH 1.680   Recent Lipid Panel    Component Value Date/Time   CHOL 216 (H) 02/11/2014 0650   TRIG 92 02/11/2014 0650   HDL 65 02/11/2014 0650   CHOLHDL 3.3 02/11/2014 0650   VLDL 18 02/11/2014 0650   LDLCALC 133 (H) 02/11/2014 0650    Physical Exam:   VS:  BP 122/80   Pulse 84   Ht _0  (1.626 m)   Wt 239 lb (108.4 kg)   SpO2 94%   BMI 41.02 kg/m    Wt Readings from Last 3 Encounters:  12/10/20 239 lb (108.4 kg)  11/07/20 229 lb (103.9 kg)  09/10/20 229 lb 9.6 oz (104.1 kg)    General: Well nourished, well developed, in no acute distress Head: Atraumatic, normal size  Eyes: PEERLA, EOMI  Neck: Supple, no JVD Endocrine: No thryomegaly Cardiac: Normal S1, S2; RRR; no murmurs, rubs, or gallops Lungs: Clear to auscultation bilaterally, no wheezing, rhonchi or rales  Abd:  Soft, nontender, no hepatomegaly  Ext: 1+ lower extremity edema Musculoskeletal: No deformities, BUE and BLE strength normal and equal Skin: Warm and dry, no rashes   Neuro: Alert and oriented to person, place, time, and situation, CNII-XII grossly intact, no focal deficits  Psych: Normal mood and affect   ASSESSMENT:   MAR ZETTLER is a 75 y.o. female who presents for the following: 1. Palpitations   2. PAC (premature atrial contraction)   3. Mixed hyperlipidemia   4. Primary hypertension   5. Obesity (BMI 30-39.9)   6. Type 2 diabetes mellitus with complication, without long-term current use of insulin ([place])     PLAN:   1. Palpitations 2. PAC (premature atrial contraction) -Infrequent PACs.  Less than 1%.  Seems to be doing well on no medication.  We will continue to monitor for now.  She not having any symptoms currently.  3. Mixed hyperlipidemia -Diabetic.  Most recent LDL 122.  She cannot tolerate statins.  We tried her again on Crestor.  She will just continue Zetia 10 mg daily.  4. Primary hypertension -BP well controlled.  BP goal is less than 130/80.  5. Obesity (BMI 30-39.9) -Diet and exercise recommended.  6. Type 2 diabetes mellitus with complication, without long-term current use of insulin ([facility]) -Most recent A1c 6.5.  We have her on Jardiance 10 mg daily to help with volume.  Disposition: Return in about 1 year (around 12/10/2021).  Medication Adjustments/Labs and Tests Ordered: Current medicines are reviewed at length with the patient today.  Concerns regarding medicines are outlined above.  No orders of the defined types were placed in this encounter.  Meds ordered this encounter  Medications  . empagliflozin (JARDIANCE) 10 MG TABS tablet    Sig: Take 1 tablet (10 mg total) by mouth daily before breakfast.    Dispense:  90 tablet    Refill:  3    Patient Instructions  Medication Instructions:  The  current medical regimen is effective;  continue present  plan and medications.  *If you need a refill on your cardiac medications before your next appointment, please call your pharmacy*   Follow-Up: At [facility], you and your health needs are our priority.  As part of our continuing mission to provide you with exceptional heart care, we have created designated Provider Care Teams.  These Care Teams include your primary Cardiologist (physician) and Advanced Practice Providers (APPs -  Physician Assistants and Nurse Practitioners) who all work together to provide you with the care you need, when you need it.  We recommend signing up for the patient portal called "MyChart".  Sign up information is provided on this After Visit Summary.  MyChart is used to connect with patients for Virtual Visits (Telemedicine).  Patients are able to view lab/test results, encounter notes, upcoming appointments, etc.  Non-urgent messages can be sent to your provider as well.   To learn more about what you can do with MyChart, go to [email or web address].    Your next appointment:   12 month(s)  The format for your next appointment:   In Person  Provider:   Eleonore Chiquito, MD         Time Spent with Patient: I have spent a total of 25 minutes with patient reviewing hospital notes, telemetry, EKGs, labs and examining the patient as well as establishing an assessment and plan that was discussed with the patient.  > 50% of time was spent in direct patient care.  Signed, Addison Naegeli. Audie Box, [place]  [address] [place], [place] [address] [phone]  12/10/2020 8:15 AM

## 2020-12-10 ENCOUNTER — Ambulatory Visit (INDEPENDENT_AMBULATORY_CARE_PROVIDER_SITE_OTHER): Payer: HMO | Admitting: Cardiovascular Disease

## 2020-12-10 ENCOUNTER — Other Ambulatory Visit: Payer: Self-pay

## 2020-12-10 ENCOUNTER — Encounter: Payer: Self-pay | Admitting: Cardiovascular Disease

## 2020-12-10 VITALS — BP 122/80 | HR 84 | Ht 64.0 in | Wt 239.0 lb

## 2020-12-10 DIAGNOSIS — E782 Mixed hyperlipidemia: Secondary | ICD-10-CM | POA: Diagnosis not present

## 2020-12-10 DIAGNOSIS — E669 Obesity, unspecified: Secondary | ICD-10-CM | POA: Diagnosis not present

## 2020-12-10 DIAGNOSIS — R002 Palpitations: Secondary | ICD-10-CM | POA: Diagnosis not present

## 2020-12-10 DIAGNOSIS — I10 Essential (primary) hypertension: Secondary | ICD-10-CM | POA: Diagnosis not present

## 2020-12-10 DIAGNOSIS — I491 Atrial premature depolarization: Secondary | ICD-10-CM

## 2020-12-10 DIAGNOSIS — E118 Type 2 diabetes mellitus with unspecified complications: Secondary | ICD-10-CM | POA: Diagnosis not present

## 2020-12-10 MED ORDER — EMPAGLIFLOZIN 10 MG PO TABS
10.0000 mg | ORAL_TABLET | Freq: Every day | ORAL | 3 refills | Status: DC
Start: 2020-12-10 — End: 2022-01-10

## 2020-12-10 NOTE — Patient Instructions (Signed)

## 2020-12-12 ENCOUNTER — Encounter (HOSPITAL_BASED_OUTPATIENT_CLINIC_OR_DEPARTMENT_OTHER): Payer: HMO | Attending: Physician Assistant | Admitting: Physician Assistant

## 2020-12-12 ENCOUNTER — Other Ambulatory Visit: Payer: Self-pay

## 2020-12-12 DIAGNOSIS — L97829 Non-pressure chronic ulcer of other part of left lower leg with unspecified severity: Secondary | ICD-10-CM | POA: Diagnosis present

## 2020-12-12 DIAGNOSIS — B182 Chronic viral hepatitis C: Secondary | ICD-10-CM | POA: Insufficient documentation

## 2020-12-12 DIAGNOSIS — L97822 Non-pressure chronic ulcer of other part of left lower leg with fat layer exposed: Secondary | ICD-10-CM | POA: Diagnosis not present

## 2020-12-12 DIAGNOSIS — E1140 Type 2 diabetes mellitus with diabetic neuropathy, unspecified: Secondary | ICD-10-CM | POA: Diagnosis not present

## 2020-12-12 DIAGNOSIS — Z6841 Body Mass Index (BMI) 40.0 and over, adult: Secondary | ICD-10-CM | POA: Insufficient documentation

## 2020-12-12 DIAGNOSIS — E669 Obesity, unspecified: Secondary | ICD-10-CM | POA: Diagnosis not present

## 2020-12-12 DIAGNOSIS — I10 Essential (primary) hypertension: Secondary | ICD-10-CM | POA: Diagnosis not present

## 2020-12-12 DIAGNOSIS — E11622 Type 2 diabetes mellitus with other skin ulcer: Secondary | ICD-10-CM | POA: Insufficient documentation

## 2020-12-12 DIAGNOSIS — I872 Venous insufficiency (chronic) (peripheral): Secondary | ICD-10-CM | POA: Diagnosis not present

## 2020-12-12 NOTE — Progress Notes (Addendum)
ESPYN, RADWAN ([id number]) Visit Report for 12/12/2020 Chief Complaint Document Details Patient Name: Date of Service: Cathy Gonzalez 12/12/2020 2:15 PM Medical Record Number: [id number] Patient Account Number: [id number] Date of Birth/Sex: Treating RN: 04/29/1946 (75 y.o. [name] Primary Care Provider: [name] Other Clinician: Referring Provider: Treating Provider/Extender: [name] in Treatment: 3 Information Obtained from: Patient Chief Complaint Left LE Ulcer Electronic Signature(s) Signed: 12/12/2020 3:13:36 PM By: Cathy Keeler PA-C Entered By: Cathy Gonzalez on 12/12/2020 15:13:35 -------------------------------------------------------------------------------- Debridement Details Patient Name: Date of Service: Cathy Gonzalez 12/12/2020 2:15 PM Medical Record Number: [id number] Patient Account Number: [id number] Date of Birth/Sex: Treating RN: [birth year] (81 y.o. [name] Primary Care Provider: [name] Other Clinician: Referring Provider: Treating Provider/Extender: [name] in Treatment: 3 Debridement Performed for Assessment: Wound #1 Left,Anterior Lower Leg Performed By: Physician Cathy Keeler, PA Debridement Type: Chemical/Enzymatic/Mechanical Agent Used: gauze Severity of Tissue Pre Debridement: Fat layer exposed Level of Consciousness (Pre-procedure): Awake and Alert Pre-procedure Verification/Time Out Yes - 15:23 Taken: Start Time: 15:24 Instrument: Other : Gauze Bleeding: None End Time: 15:26 Response to Treatment: Procedure was tolerated well Level of Consciousness (Post- Awake and Alert procedure): Post Debridement Measurements of Total Wound Length: (cm) 0.3 Width: (cm) 0.3 Depth: (cm) 0.1 Volume: (cm) 0.007 Character of Wound/Ulcer Post Debridement: Stable Severity of Tissue Post Debridement: Fat layer exposed Post Procedure Diagnosis Same as  Pre-procedure Electronic Signature(s) Signed: 12/12/2020 5:38:33 PM By: Cathy Gonzalez Signed: 12/12/2020 6:04:37 PM By: Cathy Keeler PA-C Signed: 12/14/2020 6:00:02 PM By: Baruch Gouty RN, BSN Entered By: Cathy Gonzalez on 12/12/2020 15:26:52 -------------------------------------------------------------------------------- HPI Details Patient Name: Date of Service: Cathy Gonzalez EREE N. 12/12/2020 2:15 PM Medical Record Number: [id number] Patient Account Number: [id number] Date of Birth/Sex: Treating RN: 04-28-1946 (89 y.o. [name] Primary Care Provider: [name] Other Clinician: Referring Provider: Treating Provider/Extender: [name] in Treatment: 3 History of Present Illness HPI Description: 11/21/2020 upon evaluation today patient appears to be doing well with regard to her wound all things considered. With that being said she has been tolerating the dressing changes without complication. Fortunately there is no signs of active infection at this time which is great news. No fevers, chills, nausea, vomiting, or diarrhea. In general I am extremely happy with the fact that the patient seems to be making such good progress here. She tells me it has come along way from where it started. Nonetheless it has been about a month and still not closed. She does have chronic venous stasis, diabetes mellitus type 2, her most recent hemoglobin A1c was 6.5 although I am not exactly sure of the date on that. That is just per the records reviewed. She does have hypertension and chronic viral hepatitis C as well. She also has neuropathy in her feet. This apparently is secondary to the diabetes. There does not appear to be any signs of active infection right now which is great news she has been on 2 rounds of Keflex as prescribed by the primary care provider I see no signs of infection at this point. The patient did have use of Voltaren gel as well as Thera works that  she had utilized prior to this starting and she feels like that may have caused the issue. 11/28/2020 upon evaluation today patient appears to be doing well in regard to  her leg ulcer. I am actually seeing signs of good improvement here which is excellent news. There does not appear to be any evidence of active infection which is great news. I think the collagen is doing a good job. 12/12/2020 upon evaluation today patient appears to be doing well with regard to her wound on the leg. This is showing signs of good improvement which is excellent news and there does not appear to be any evidence of active infection at this point. No fevers, chills, nausea, vomiting, or diarrhea. Electronic Signature(s) Signed: 12/12/2020 3:31:32 PM By: Cathy Keeler PA-C Entered By: Cathy Gonzalez on 12/12/2020 15:31:32 -------------------------------------------------------------------------------- Physical Exam Details Patient Name: Date of Service: Cathy Gonzalez 12/12/2020 2:15 PM Medical Record Number: [id number] Patient Account Number: [id number] Date of Birth/Sex: Treating RN: 26-Nov-1945 (30 y.o. [name] Primary Care Provider: [name] Other Clinician: Referring Provider: Treating Provider/Extender: [name] in Treatment: 3 Constitutional Well-nourished and well-hydrated in no acute distress. Respiratory normal breathing without difficulty. Psychiatric this patient is able to make decisions and demonstrates good insight into disease process. Alert and Oriented x 3. pleasant and cooperative. Notes Patient's wound bed showed showed good signs of granulation epithelization. There does not appear to be any evidence of active infection which is great news and overall I am extremely pleased with where things stand. No fevers, chills, nausea, vomiting, or diarrhea. Electronic Signature(s) Signed: 12/12/2020 3:32:55 PM By: Cathy Keeler PA-C Entered By: Cathy Gonzalez on 12/12/2020 15:32:54 -------------------------------------------------------------------------------- Physician Orders Details Patient Name: Date of Service: Cathy Gonzalez 12/12/2020 2:15 PM Medical Record Number: [id number] Patient Account Number: [id number] Date of Birth/Sex: Treating RN: [birth year] (85 y.o. [name] Primary Care Provider: [name] Other Clinician: Referring Provider: Treating Provider/Extender: [name] in Treatment: 3 Verbal / Phone Orders: No Diagnosis Coding ICD-10 Coding Code Description I87.2 Venous insufficiency (chronic) (peripheral) L97.822 Non-pressure chronic ulcer of other part of left lower leg with fat layer exposed E11.622 Type 2 diabetes mellitus with other skin ulcer E11.40 Type 2 diabetes mellitus with diabetic neuropathy, unspecified I10 Essential (primary) hypertension B18.2 Chronic viral hepatitis C Follow-up Appointments Return Appointment in 1 week. Bathing/ Shower/ Hygiene May shower with protection but do not get wound dressing(s) wet. - use cast protector over wrap to protect in shower Edema Control - Lymphedema / SCD / Other Bilateral Lower Extremities Elevate legs to the level of the heart or above for 30 minutes daily and/or when sitting, a frequency of: - throughout the day Avoid standing for long periods of time. Exercise regularly Wound Treatment Wound #1 - Lower Leg Wound Laterality: Left, Anterior Peri-Wound Care: Sween Lotion (Moisturizing lotion) 1 x Per Week/7 Days Discharge Instructions: Apply moisturizing lotion to leg Prim Dressing: Promogran Prisma Matrix, 4.34 (sq in) (silver collagen) 1 x Per Week/7 Days ary Discharge Instructions: Moisten collagen with saline or hydrogel Secondary Dressing: Woven Gauze Sponge, Non-Sterile 4x4 in 1 x Per Week/7 Days Discharge Instructions: Apply over primary dressing as directed. Compression Wrap: ThreePress (3 layer compression  wrap) 1 x Per Week/7 Days Discharge Instructions: Apply three layer compression as directed. Compression Stockings: Carolon Multi Layer Compression Stocking (DME) Left Leg Compression Amount: 30-40 mmHG Discharge Instructions: Apply compression stocking daily as instructed. Apply first thing in the morning, remove at night before bed. Electronic Signature(s) Signed: 12/12/2020 5:38:33 PM By: Cathy Gonzalez Signed: 12/12/2020 6:04:37 PM By:  Melburn Hake, Mamoru Takeshita PA-C Entered By: Cathy Gonzalez on 12/12/2020 15:29:02 -------------------------------------------------------------------------------- Problem List Details Patient Name: Date of Service: Cathy Gonzalez 12/12/2020 2:15 PM Medical Record Number: [id number] Patient Account Number: [id number] Date of Birth/Sex: Treating RN: 1946-06-03 (11 y.o. [name] Primary Care Provider: [name] Other Clinician: Referring Provider: Treating Provider/Extender: [name] in Treatment: 3 Active Problems ICD-10 Encounter Code Description Active Date MDM Diagnosis I87.2 Venous insufficiency (chronic) (peripheral) 11/21/2020 No Yes L97.822 Non-pressure chronic ulcer of other part of left lower leg with fat layer exposed2/16/2022 No Yes E11.622 Type 2 diabetes mellitus with other skin ulcer 11/21/2020 No Yes E11.40 Type 2 diabetes mellitus with diabetic neuropathy, unspecified 11/21/2020 No Yes I10 Essential (primary) hypertension 11/21/2020 No Yes B18.2 Chronic viral hepatitis C 11/21/2020 No Yes Inactive Problems Resolved Problems Electronic Signature(s) Signed: 12/12/2020 3:13:24 PM By: Cathy Keeler PA-C Entered By: Cathy Gonzalez on 12/12/2020 15:13:24 -------------------------------------------------------------------------------- Progress Note Details Patient Name: Date of Service: Cathy Gonzalez 12/12/2020 2:15 PM Medical Record Number: [id number] Patient Account Number: [id number] Date of  Birth/Sex: Treating RN: 19-Jul-1946 (60 y.o. [name] Primary Care Provider: [name] Other Clinician: Referring Provider: Treating Provider/Extender: [name] in Treatment: 3 Subjective Chief Complaint Information obtained from Patient Left LE Ulcer History of Present Illness (HPI) 11/21/2020 upon evaluation today patient appears to be doing well with regard to her wound all things considered. With that being said she has been tolerating the dressing changes without complication. Fortunately there is no signs of active infection at this time which is great news. No fevers, chills, nausea, vomiting, or diarrhea. In general I am extremely happy with the fact that the patient seems to be making such good progress here. She tells me it has come along way from where it started. Nonetheless it has been about a month and still not closed. She does have chronic venous stasis, diabetes mellitus type 2, her most recent hemoglobin A1c was 6.5 although I am not exactly sure of the date on that. That is just per the records reviewed. She does have hypertension and chronic viral hepatitis C as well. She also has neuropathy in her feet. This apparently is secondary to the diabetes. There does not appear to be any signs of active infection right now which is great news she has been on 2 rounds of Keflex as prescribed by the primary care provider I see no signs of infection at this point. The patient did have use of Voltaren gel as well as Thera works that she had utilized prior to this starting and she feels like that may have caused the issue. 11/28/2020 upon evaluation today patient appears to be doing well in regard to her leg ulcer. I am actually seeing signs of good improvement here which is excellent news. There does not appear to be any evidence of active infection which is great news. I think the collagen is doing a good job. 12/12/2020 upon evaluation today  patient appears to be doing well with regard to her wound on the leg. This is showing signs of good improvement which is excellent news and there does not appear to be any evidence of active infection at this point. No fevers, chills, nausea, vomiting, or diarrhea. Objective Constitutional Well-nourished and well-hydrated in no acute distress. Vitals Time Taken: 2:16 PM, Height: 64 in, Weight: 225 lbs, BMI: 38.6, Temperature: 97.7 F, Pulse: 84  bpm, Respiratory Rate: 17 breaths/min, Blood Pressure: 145/84 mmHg. Respiratory normal breathing without difficulty. Psychiatric this patient is able to make decisions and demonstrates good insight into disease process. Alert and Oriented x 3. pleasant and cooperative. General Notes: Patient's wound bed showed showed good signs of granulation epithelization. There does not appear to be any evidence of active infection which is great news and overall I am extremely pleased with where things stand. No fevers, chills, nausea, vomiting, or diarrhea. Integumentary (Hair, Skin) Wound #1 status is Open. Original cause of wound was Gradually Appeared. The date acquired was: 10/07/2019. The wound has been in treatment 3 weeks. The wound is located on the Left,Anterior Lower Leg. The wound measures 0.3cm length x 0.3cm width x 0.1cm depth; 0.071cm^2 area and 0.007cm^3 volume. There is Fat Layer (Subcutaneous Tissue) exposed. There is no tunneling or undermining noted. There is a small amount of serosanguineous drainage noted. The wound margin is flat and intact. There is large (67-100%) pink granulation within the wound bed. There is no necrotic tissue within the wound bed. Assessment Active Problems ICD-10 Venous insufficiency (chronic) (peripheral) Non-pressure chronic ulcer of other part of left lower leg with fat layer exposed Type 2 diabetes mellitus with other skin ulcer Type 2 diabetes mellitus with diabetic neuropathy, unspecified Essential (primary)  hypertension Chronic viral hepatitis C Procedures Wound #1 Pre-procedure diagnosis of Wound #1 is a Venous Leg Ulcer located on the Left,Anterior Lower Leg .Severity of Tissue Pre Debridement is: Fat layer exposed. There was a Chemical/Enzymatic/Mechanical debridement performed by Cathy Keeler, PA. With the following instrument(s): Gauze. Other agent used was gauze. A time out was conducted at 15:23, prior to the start of the procedure. There was no bleeding. The procedure was tolerated well. Post Debridement Measurements: 0.3cm length x 0.3cm width x 0.1cm depth; 0.007cm^3 volume. Character of Wound/Ulcer Post Debridement is stable. Severity of Tissue Post Debridement is: Fat layer exposed. Post procedure Diagnosis Wound #1: Same as Pre-Procedure Pre-procedure diagnosis of Wound #1 is a Venous Leg Ulcer located on the Left,Anterior Lower Leg . There was a Three Layer Compression Therapy Procedure by Rhae Hammock, RN. Post procedure Diagnosis Wound #1: Same as Pre-Procedure Plan Follow-up Appointments: Return Appointment in 1 week. Bathing/ Shower/ Hygiene: May shower with protection but do not get wound dressing(s) wet. - use cast protector over wrap to protect in shower Edema Control - Lymphedema / SCD / Other: Elevate legs to the level of the heart or above for 30 minutes daily and/or when sitting, a frequency of: - throughout the day Avoid standing for long periods of time. Exercise regularly WOUND #1: - Lower Leg Wound Laterality: Left, Anterior Peri-Wound Care: Sween Lotion (Moisturizing lotion) 1 x Per Week/7 Days Discharge Instructions: Apply moisturizing lotion to leg Prim Dressing: Promogran Prisma Matrix, 4.34 (sq in) (silver collagen) 1 x Per Week/7 Days ary Discharge Instructions: Moisten collagen with saline or hydrogel Secondary Dressing: Woven Gauze Sponge, Non-Sterile 4x4 in 1 x Per Week/7 Days Discharge Instructions: Apply over primary dressing as directed. Com  pression Wrap: ThreePress (3 layer compression wrap) 1 x Per Week/7 Days Discharge Instructions: Apply three layer compression as directed. Com pression Stockings: Carolon Multi Layer Compression Stocking (DME) Compression Amount: 30-40 mmHg (left) Discharge Instructions: Apply compression stocking daily as instructed. Apply first thing in the morning, remove at night before bed. 1. Would recommend currently that we go ahead and continue with the wound care measures as before and the patient is tolerating the wrap without  any complications of this is great news. Therefore we will continue with a 3 layer compression wrap. 2. I am also can recommend at this point that we have the patient go ahead and utilize the collagen dressing I think that still the right way to go. 3. I am also can recommend she continue to elevate her legs as much as possible we will order a dual layer compression wrap for her to help with edema control. We will see patient back for reevaluation in 1 week here in the clinic. If anything worsens or changes patient will contact our office for additional recommendations. Electronic Signature(s) Signed: 12/12/2020 3:33:40 PM By: Cathy Keeler PA-C Entered By: Cathy Gonzalez on 12/12/2020 15:33:39 -------------------------------------------------------------------------------- SuperBill Details Patient Name: Date of Service: Cathy Gonzalez 12/12/2020 Medical Record Number: [id number] Patient Account Number: [id number] Date of Birth/Sex: Treating RN: 1946/04/18 (47 y.o. [name] Primary Care Provider: [name] Other Clinician: Referring Provider: Treating Provider/Extender: [name] in Treatment: 3 Diagnosis Coding ICD-10 Codes Code Description I87.2 Venous insufficiency (chronic) (peripheral) L97.822 Non-pressure chronic ulcer of other part of left lower leg with fat layer exposed E11.622 Type 2 diabetes mellitus with other  skin ulcer E11.40 Type 2 diabetes mellitus with diabetic neuropathy, unspecified I10 Essential (primary) hypertension B18.2 Chronic viral hepatitis C Facility Procedures Physician Procedures : CPT4 Code Description Modifier [id number] - WC PHYS LEVEL 3 - EST PT ICD-10 Diagnosis Description I87.2 Venous insufficiency (chronic) (peripheral) L97.822 Non-pressure chronic ulcer of other part of left lower leg with fat layer exposed E11.622  Type 2 diabetes mellitus with other skin ulcer E11.40 Type 2 diabetes mellitus with diabetic neuropathy, unspecified Quantity: 1 Electronic Signature(s) Signed: 12/12/2020 6:04:37 PM By: Cathy Keeler PA-C Signed: 12/13/2020 5:16:58 PM By: Levan Hurst RN, BSN Previous Signature: 12/12/2020 3:33:51 PM Version By: Cathy Keeler PA-C Entered By: Levan Hurst on 12/12/2020 17:36:52

## 2020-12-14 DIAGNOSIS — G4733 Obstructive sleep apnea (adult) (pediatric): Secondary | ICD-10-CM | POA: Diagnosis not present

## 2020-12-14 DIAGNOSIS — I871 Compression of vein: Secondary | ICD-10-CM | POA: Diagnosis not present

## 2020-12-14 NOTE — Progress Notes (Signed)
Cathy, Gonzalez ([id number]) Visit Report for 12/12/2020 Arrival Information Details Patient Name: Date of Service: Cathy Gonzalez 12/12/2020 2:15 PM Medical Record Number: [id number] Patient Account Number: [id number] Date of Birth/Sex: Cathy Gonzalez (75 y.o. [name], [name] Primary Care Arlena Marsan: [name] Other Clinician: Referring Chelsae Zanella: Cathy [place] Frieden/Extender: Katherine Roan in Treatment: 3 Visit Information History Since Last Visit Added or deleted any medications: No Patient Arrived: Ambulatory Any new allergies or adverse reactions: No Arrival Time: 14:16 Had a fall or experienced change in No Accompanied By: self activities of daily living that may affect Transfer Assistance: None risk of falls: Patient Identification Verified: Yes Signs or symptoms of abuse/neglect since last visito No Secondary Verification Process Completed: Yes Hospitalized since last visit: No Patient Requires Transmission-Based Precautions: No Implantable device outside of the clinic excluding No Patient Has Alerts: No cellular tissue based products placed in the center since last visit: Has Dressing in Place as Prescribed: Yes Has Compression in Place as Prescribed: Yes Pain Present Now: No Electronic Signature(s) Signed: 12/12/2020 5:36:09 PM By: Rhae Hammock RN Entered By: Rhae Hammock on 12/12/2020 14:17:37 -------------------------------------------------------------------------------- Compression Therapy Details Patient Name: Date of Service: Cathy Gonzalez 12/12/2020 2:15 PM Medical Record Number: [id number] Patient Account Number: [id number] Date of Birth/Sex: Cathy Gonzalez (89 y.o. [name] Primary Care [name]: [name] Other Clinician: Referring Wrangler Penning: Cathy [name]/Extender: [name] in Treatment: 3 Compression Therapy Performed for Wound Assessment: Wound #1  Left,Anterior Lower Leg Performed By: Clinician Rhae Hammock, RN Compression Type: Three Layer Post Procedure Diagnosis Same as Pre-procedure Electronic Signature(s) Signed: 12/14/2020 6:00:02 PM By: Baruch Gouty RN, BSN Entered By: Baruch Gouty on 12/12/2020 15:16:49 -------------------------------------------------------------------------------- Encounter Discharge Information Details Patient Name: Date of Service: Cathy Gonzalez 12/12/2020 2:15 PM Medical Record Number: [id number] Patient Account Number: [id number] Date of Birth/Sex: Cathy Gonzalez (75 y.o. [name], [name] Primary Care [name]: [name] Other Clinician: Referring Alexandros Ewan: Cathy Cela Newcom/Extender: [name] in Treatment: 3 Encounter Discharge Information Items Post Procedure Vitals Discharge Condition: Stable Temperature (F): 97.7 Ambulatory Status: Ambulatory Pulse (bpm): 84 Discharge Destination: Home Respiratory Rate (breaths/min): 17 Transportation: Private Auto Blood Pressure (mmHg): 145/84 Accompanied By: self Schedule Follow-up Appointment: Yes Clinical Summary of Care: Patient Declined Electronic Signature(s) Signed: 12/12/2020 5:36:09 PM By: Rhae Hammock RN Entered By: Rhae Hammock on 12/12/2020 16:07:10 -------------------------------------------------------------------------------- Lower Extremity Assessment Details Patient Name: Date of Service: Cathy Gonzalez 12/12/2020 2:15 PM Medical Record Number: [id number] Patient Account Number: [id number] Date of Birth/Sex: Cathy Gonzalez (75 y.o. [name], [name] Primary Care Rockell Faulks: [name] Other Clinician: Referring Crews Mccollam: Cathy [name]/Extender: [name] in Treatment: 3 Edema Assessment Assessed: [Left: Yes] [Right: No] Edema: [Left: Ye] [Right: s] Calf Left: Right: Point of Measurement: 31 cm From Medial Instep  41.5 cm Ankle Left: Right: Point of Measurement: 8 cm From Medial Instep 24.5 cm Knee To Floor Left: Right: From Medial Instep 44 cm Vascular Assessment Pulses: Dorsalis Pedis Palpable: [Left:Yes] Posterior Tibial Palpable: [Left:Yes] Electronic Signature(s) Signed: 12/12/2020 5:36:09 PM By: Rhae Hammock RN Signed: 12/12/2020 5:36:09 PM By: Rhae Hammock RN Entered By: Rhae Hammock on 12/12/2020 14:23:25 -------------------------------------------------------------------------------- Multi Wound Chart Details Patient Name: Date of Service: Cathy Gonzalez. 12/12/2020 2:15 PM Medical Record Number: [id number] Patient Account Number: [id number] Date of Birth/Sex: Treating  RN: [birth year] (75 y.o. [name] Primary Care [name]: [name] Other Clinician: Referring Jozy Mcphearson: Cathy [name]/Extender: [name] in Treatment: 3 Vital Signs Height(in): 64 Pulse(bpm): 84 Weight(lbs): 225 Blood Pressure(mmHg): 145/84 Body Mass Index(BMI): 39 Temperature(F): 97.7 Respiratory Rate(breaths/min): 17 Photos: [1:No Photos Left, Anterior Lower Leg] [N/A:N/A N/A] Wound Location: [1:Gradually Appeared] [N/A:N/A] Wounding Event: [1:Venous Leg Ulcer] [N/A:N/A] Primary Etiology: [1:Sleep Apnea, Hypertension, Hepatitis N/A] Comorbid History: [1:C, Type II Diabetes, Gout, Osteoarthritis, Neuropathy, Seizure Disorder 10/07/2019] [N/A:N/A] Date Acquired: [1:3] [N/A:N/A] Weeks of Treatment: [1:Open] [N/A:N/A] Wound Status: [1:0.3x0.3x0.1] [N/A:N/A] Measurements L x W x D (cm) [1:0.071] [N/A:N/A] A (cm) : rea [1:0.007] [N/A:N/A] Volume (cm) : [1:54.80%] [N/A:N/A] % Reduction in A [1:rea: 56.30%] [N/A:N/A] % Reduction in Volume: [1:Full Thickness Without Exposed] [N/A:N/A] Classification: [1:Support Structures Small] [N/A:N/A] Exudate A mount: [1:Serosanguineous] [N/A:N/A] Exudate Type: [1:red, brown] [N/A:N/A] Exudate Color: [1:Flat  and Intact] [N/A:N/A] Wound Margin: [1:Large (67-100%)] [N/A:N/A] Granulation A mount: [1:Pink] [N/A:N/A] Granulation Quality: [1:None Present (0%)] [N/A:N/A] Necrotic A mount: [1:Fat Layer (Subcutaneous Tissue): Yes N/A] Exposed Structures: [1:Fascia: No Tendon: No Muscle: No Joint: No Bone: No Small (1-33%)] [N/A:N/A] Epithelialization: [1:Chemical/Enzymatic/Mechanical] [N/A:N/A] Debridement: Pre-procedure Verification/Time Out 15:23 [N/A:N/A] Taken: [1:Other(Gauze)] [N/A:N/A] Instrument: [1:None] [N/A:N/A] Bleeding: [1:Procedure was tolerated well] [N/A:N/A] Debridement Treatment Response: [1:0.3x0.3x0.1] [N/A:N/A] Post Debridement Measurements L x W x D (cm) [1:0.007] [N/A:N/A] Post Debridement Volume: (cm) [1:Compression Therapy] [N/A:N/A] Procedures Performed: [1:Debridement] Treatment Notes Electronic Signature(s) Signed: 12/12/2020 5:38:33 PM By: Lorrin Jackson Signed: 12/14/2020 6:00:02 PM By: Baruch Gouty RN, BSN Entered By: Lorrin Jackson on 12/12/2020 15:28:18 -------------------------------------------------------------------------------- Multi-Disciplinary Care Plan Details Patient Name: Date of Service: Cathy Gonzalez. 12/12/2020 2:15 PM Medical Record Number: [id number] Patient Account Number: [id number] Date of Birth/Sex: Treating RN: [birth year] (75 y.o. [name] Primary Care Viyan Rosamond: [name] Other Clinician: Referring Tafari Humiston: Cathy Marissia Blackham/Extender: Katherine Roan in Treatment: 3 Multidisciplinary Care Plan reviewed with physician Active Inactive Nutrition Nursing Diagnoses: Impaired glucose control: actual or potential Potential for alteratiion in Nutrition/Potential for imbalanced nutrition Goals: Patient/caregiver will maintain therapeutic glucose control Date Initiated: 11/21/2020 Target Resolution Date: 12/19/2020 Goal Status: Active Interventions: Assess patient nutrition upon admission and as needed  per policy Provide education on elevated blood sugars and impact on wound healing Treatment Activities: Patient referred to Primary Care Physician for further nutritional evaluation : 11/21/2020 Notes: Venous Leg Ulcer Nursing Diagnoses: Knowledge deficit related to disease process and management Potential for venous Insuffiency (use before diagnosis confirmed) Goals: Patient will maintain optimal edema control Date Initiated: 11/21/2020 Target Resolution Date: 12/19/2020 Goal Status: Active Interventions: Assess peripheral edema status every visit. Compression as ordered Provide education on venous insufficiency Treatment Activities: Therapeutic compression applied : 11/21/2020 Notes: Wound/Skin Impairment Nursing Diagnoses: Impaired tissue integrity Knowledge deficit related to ulceration/compromised skin integrity Goals: Patient/caregiver will verbalize understanding of skin care regimen Date Initiated: 11/21/2020 Target Resolution Date: 12/19/2020 Goal Status: Active Ulcer/skin breakdown will have a volume reduction of 30% by week 4 Date Initiated: 11/21/2020 Target Resolution Date: 12/19/2020 Goal Status: Active Interventions: Assess patient/caregiver ability to obtain necessary supplies Assess patient/caregiver ability to perform ulcer/skin care regimen upon admission and as needed Assess ulceration(s) every visit Treatment Activities: Skin care regimen initiated : 11/21/2020 Topical wound management initiated : 11/21/2020 Notes: Electronic Signature(s) Signed: 12/14/2020 6:00:02 PM By: Baruch Gouty RN, BSN Entered By: Baruch Gouty on 12/12/2020 15:15:33 -------------------------------------------------------------------------------- Pain Assessment Details Patient Name: Date of Service: Drue Stager, [place]  N. 12/12/2020 2:15 PM Medical Record Number: [id number] Patient Account Number: [id number] Date of Birth/Sex: Treating RN: [birth year] (75 y.o. [name],  [name] Primary Care Mayzee Reichenbach: [name] Other Clinician: Referring Cleave Ternes: Cathy Hokulani Rogel/Extender: [name] in Treatment: 3 Active Problems Location of Pain Severity and Description of Pain Patient Has Paino No Site Locations Pain Management and Medication Current Pain Management: Electronic Signature(s) Signed: 12/12/2020 5:36:09 PM By: Rhae Hammock RN Entered By: Rhae Hammock on 12/12/2020 14:16:51 -------------------------------------------------------------------------------- Patient/Caregiver Education Details Patient Name: Date of Service: Cathy Gonzalez 3/9/2022andnbsp2:15 PM Medical Record Number: [id number] Patient Account Number: [id number] Date of Birth/Gender: Treating RN: [birth year] (6 y.o. [name] Primary Care Physician: [name] Other Clinician: Referring Physician: Treating Physician/Extender: Katherine Roan in Treatment: 3 Education Assessment Education Provided To: Patient Education Topics Provided Venous: Methods: Explain/Verbal Responses: Reinforcements needed, State content correctly Electronic Signature(s) Signed: 12/14/2020 6:00:02 PM By: Baruch Gouty RN, BSN Entered By: Baruch Gouty on 12/12/2020 15:15:50 -------------------------------------------------------------------------------- Wound Assessment Details Patient Name: Date of Service: Cathy Gonzalez 12/12/2020 2:15 PM Medical Record Number: [id number] Patient Account Number: [id number] Date of Birth/Sex: Treating RN: [birth year] (75 y.o. [name], [name] Primary Care Amillya Chavira: [name] Other Clinician: Referring Worthington Cruzan: Cathy Parth Mccormac/Extender: [name] in Treatment: 3 Wound Status Wound Number: 1 Primary Venous Leg Ulcer Etiology: Wound Location: Left, Anterior Lower Leg Wound Open Wounding Event: Gradually Appeared Status: Date Acquired:  10/07/2019 Comorbid Sleep Apnea, Hypertension, Hepatitis C, Type II Diabetes, Gout, Weeks Of Treatment: 3 History: Osteoarthritis, Neuropathy, Seizure Disorder Clustered Wound: No Photos Wound Measurements Length: (cm) 0.3 Width: (cm) 0.3 Depth: (cm) 0.1 Area: (cm) 0.071 Volume: (cm) 0.007 % Reduction in Area: 54.8% % Reduction in Volume: 56.3% Epithelialization: Small (1-33%) Tunneling: No Undermining: No Wound Description Classification: Full Thickness Without Exposed Support Structures Wound Margin: Flat and Intact Exudate Amount: Small Exudate Type: Serosanguineous Exudate Color: red, brown Foul Odor After Cleansing: No Slough/Fibrino Yes Wound Bed Granulation Amount: Large (67-100%) Exposed Structure Granulation Quality: Pink Fascia Exposed: No Necrotic Amount: None Present (0%) Fat Layer (Subcutaneous Tissue) Exposed: Yes Tendon Exposed: No Muscle Exposed: No Joint Exposed: No Bone Exposed: No Treatment Notes Wound #1 (Lower Leg) Wound Laterality: Left, Anterior Cleanser Peri-Wound Care Sween Lotion (Moisturizing lotion) Discharge Instruction: Apply moisturizing lotion to leg Topical Primary Dressing Promogran Prisma Matrix, 4.34 (sq in) (silver collagen) Discharge Instruction: Moisten collagen with saline or hydrogel Secondary Dressing Woven Gauze Sponge, Non-Sterile 4x4 in Discharge Instruction: Apply over primary dressing as directed. Secured With Compression Wrap ThreePress (3 layer compression wrap) Discharge Instruction: Apply three layer compression as directed. Compression Stockings Carolon Multi Layer Compression Stocking Quantity: 1 Left Leg Compression Amount: 30-40 mmHg Discharge Instruction: Apply compression stocking daily as instructed. Apply first thing in the morning, remove at night before bed. Add-Ons Electronic Signature(s) Signed: 12/13/2020 5:24:28 PM By: Rhae Hammock RN Signed: 12/14/2020 10:32:25 AM By: Sandre Kitty Previous Signature: 12/12/2020 5:36:09 PM Version By: Rhae Hammock RN Entered By: Sandre Kitty on 12/13/2020 10:42:05 -------------------------------------------------------------------------------- Vitals Details Patient Name: Date of Service: Cathy Gonzalez. 12/12/2020 2:15 PM Medical Record Number: [id number] Patient Account Number: [id number] Date of Birth/Sex: Treating RN: [birth year] (76 y.o. [name], [name] Primary Care Jozee Hammer: [name] Other Clinician: Referring Kymberley Raz: Cathy Einer Meals/Extender: [name] in Treatment: 3 Vital Signs Time Taken: 14:16 Temperature (F): 97.7  Height (in): 64 Pulse (bpm): 84 Weight (lbs): 225 Respiratory Rate (breaths/min): 17 Body Mass Index (BMI): 38.6 Blood Pressure (mmHg): 145/84 Reference Range: 80 - 120 mg / dl Electronic Signature(s) Signed: 12/12/2020 5:36:09 PM By: Rhae Hammock RN Entered By: Rhae Hammock on 12/12/2020 14:16:43

## 2020-12-19 ENCOUNTER — Encounter (HOSPITAL_BASED_OUTPATIENT_CLINIC_OR_DEPARTMENT_OTHER): Payer: HMO | Admitting: Physician Assistant

## 2020-12-19 ENCOUNTER — Other Ambulatory Visit: Payer: Self-pay

## 2020-12-19 DIAGNOSIS — I872 Venous insufficiency (chronic) (peripheral): Secondary | ICD-10-CM | POA: Diagnosis not present

## 2020-12-19 DIAGNOSIS — L97822 Non-pressure chronic ulcer of other part of left lower leg with fat layer exposed: Secondary | ICD-10-CM | POA: Diagnosis not present

## 2020-12-19 NOTE — Progress Notes (Addendum)
HILLARI, ZUMWALT ([id number]) Visit Report for 12/19/2020 Chief Complaint Document Details Patient Name: Date of Service: Cathy Gonzalez 12/19/2020 3:30 PM Medical Record Number: [id number] Patient Account Number: [id number] Date of Birth/Sex: Treating RN: 01-16-1946 (75 y.o. [name] Primary Care Provider: [name] Other Clinician: Referring Provider: Treating Provider/Extender: [name] in Treatment: 4 Information Obtained from: Patient Chief Complaint Left LE Ulcer Electronic Signature(s) Signed: 12/19/2020 3:40:42 PM By: Worthy Keeler PA-C Entered By: Worthy Keeler on 12/19/2020 15:40:42 -------------------------------------------------------------------------------- HPI Details Patient Name: Date of Service: Cathy Gonzalez 12/19/2020 3:30 PM Medical Record Number: [id number] Patient Account Number: [id number] Date of Birth/Sex: Treating RN: 1946-01-05 (78 y.o. [name] Primary Care Provider: [name] Other Clinician: Referring Provider: Treating Provider/Extender: [name] in Treatment: 4 History of Present Illness HPI Description: 11/21/2020 upon evaluation today patient appears to be doing well with regard to her wound all things considered. With that being said she has been tolerating the dressing changes without complication. Fortunately there is no signs of active infection at this time which is great news. No fevers, chills, nausea, vomiting, or diarrhea. In general I am extremely happy with the fact that the patient seems to be making such good progress here. She tells me it has come along way from where it started. Nonetheless it has been about a month and still not closed. She does have chronic venous stasis, diabetes mellitus type 2, her most recent hemoglobin A1c was 6.5 although I am not exactly sure of the date on that. That is just per the records reviewed. She does  have hypertension and chronic viral hepatitis C as well. She also has neuropathy in her feet. This apparently is secondary to the diabetes. There does not appear to be any signs of active infection right now which is great news she has been on 2 rounds of Keflex as prescribed by the primary care provider I see no signs of infection at this point. The patient did have use of Voltaren gel as well as Thera works that she had utilized prior to this starting and she feels like that may have caused the issue. 11/28/2020 upon evaluation today patient appears to be doing well in regard to her leg ulcer. I am actually seeing signs of good improvement here which is excellent news. There does not appear to be any evidence of active infection which is great news. I think the collagen is doing a good job. 12/12/2020 upon evaluation today patient appears to be doing well with regard to her wound on the leg. This is showing signs of good improvement which is excellent news and there does not appear to be any evidence of active infection at this point. No fevers, chills, nausea, vomiting, or diarrhea. 12/19/2020 upon evaluation today patient appears to be doing well with regard to her leg ulcer I see no signs of infection at this point which is great news. In general I am extremely pleased with where we stand today. No fevers, chills, nausea, vomiting, or diarrhea. Electronic Signature(s) Signed: 12/19/2020 5:46:58 PM By: Worthy Keeler PA-C Entered By: Worthy Keeler on 12/19/2020 17:46:58 -------------------------------------------------------------------------------- Physical Exam Details Patient Name: Date of Service: Cathy Gonzalez 12/19/2020 3:30 PM Medical Record Number: [id number] Patient Account Number: [id number] Date of Birth/Sex: Treating RN: Jan 31, 1946 (5 y.o. [name] Primary Care Provider: [name] Other Clinician:  Referring Provider: Treating Provider/Extender: [name] in Treatment: 4 Constitutional Well-nourished and well-hydrated in no acute distress. Respiratory normal breathing without difficulty. Psychiatric this patient is able to make decisions and demonstrates good insight into disease process. Alert and Oriented x 3. pleasant and cooperative. Notes Patient's wound bed actually showed evidence of good granulation epithelization at this point. With that being said the collagen seems to be getting stuck which is not ideal. For that reason we will get a see what we can do as far as trying to cut back on any dressings that would just dry out and get stuck to the wound bed. For that reason we are going to proceed with a silver alginate dressing to not stick to the wound and hopefully keep this nice and dry so that it will not stick to the wound bed and hopefully allow this to seal up finally. Electronic Signature(s) Signed: 12/19/2020 5:49:08 PM By: Worthy Keeler PA-C Entered By: Worthy Keeler on 12/19/2020 17:49:08 -------------------------------------------------------------------------------- Physician Orders Details Patient Name: Date of Service: Cathy Gonzalez 12/19/2020 3:30 PM Medical Record Number: [id number] Patient Account Number: [id number] Date of Birth/Sex: Treating RN: July 02, 1946 (11 y.o. [name] Primary Care Provider: [name] Other Clinician: Referring Provider: Treating Provider/Extender: [name] in Treatment: 4 Verbal / Phone Orders: No Diagnosis Coding ICD-10 Coding Code Description I87.2 Venous insufficiency (chronic) (peripheral) L97.822 Non-pressure chronic ulcer of other part of left lower leg with fat layer exposed E11.622 Type 2 diabetes mellitus with other skin ulcer E11.40 Type 2 diabetes mellitus with diabetic neuropathy, unspecified I10 Essential (primary) hypertension B18.2 Chronic viral hepatitis C Follow-up Appointments Return  Appointment in 1 week. Bathing/ Shower/ Hygiene May shower with protection but do not get wound dressing(s) wet. - use cast protector over wrap to protect in shower Edema Control - Lymphedema / SCD / Other Bilateral Lower Extremities Elevate legs to the level of the heart or above for 30 minutes daily and/or when sitting, a frequency of: - throughout the day Avoid standing for long periods of time. Exercise regularly Wound Treatment Wound #1 - Lower Leg Wound Laterality: Left, Anterior Peri-Wound Care: Sween Lotion (Moisturizing lotion) 1 x Per Week/7 Days Discharge Instructions: Apply moisturizing lotion to leg Prim Dressing: KerraCel Ag Gelling Fiber Dressing, 2x2 in (silver alginate) 1 x Per Week/7 Days ary Discharge Instructions: Apply silver alginate to wound bed as instructed Secondary Dressing: Woven Gauze Sponge, Non-Sterile 4x4 in 1 x Per Week/7 Days Discharge Instructions: Apply over primary dressing as directed. Compression Wrap: ThreePress (3 layer compression wrap) 1 x Per Week/7 Days Discharge Instructions: Apply three layer compression as directed. Compression Stockings: Carolon Multi Layer Compression Stocking Left Leg Compression Amount: 30-40 mmHG Discharge Instructions: Apply compression stocking daily as instructed. Apply first thing in the morning, remove at night before bed. Electronic Signature(s) Signed: 12/19/2020 6:22:43 PM By: Worthy Keeler PA-C Signed: 12/20/2020 5:44:19 PM By: Baruch Gouty RN, BSN Entered By: Baruch Gouty on 12/19/2020 16:35:23 -------------------------------------------------------------------------------- Problem List Details Patient Name: Date of Service: Cathy Gonzalez 12/19/2020 3:30 PM Medical Record Number: [id number] Patient Account Number: [id number] Date of Birth/Sex: Treating RN: 03-18-1946 (61 y.o. [name] Primary Care Provider: [name] Other Clinician: Referring Provider: Treating  Provider/Extender: [name] in Treatment: 4 Active Problems ICD-10 Encounter Code Description Active Date MDM Diagnosis I87.2 Venous insufficiency (chronic) (peripheral) 11/21/2020  No Yes L97.822 Non-pressure chronic ulcer of other part of left lower leg with fat layer exposed2/16/2022 No Yes E11.622 Type 2 diabetes mellitus with other skin ulcer 11/21/2020 No Yes E11.40 Type 2 diabetes mellitus with diabetic neuropathy, unspecified 11/21/2020 No Yes I10 Essential (primary) hypertension 11/21/2020 No Yes B18.2 Chronic viral hepatitis C 11/21/2020 No Yes Inactive Problems Resolved Problems Electronic Signature(s) Signed: 12/19/2020 3:40:35 PM By: Worthy Keeler PA-C Entered By: Worthy Keeler on 12/19/2020 15:40:35 -------------------------------------------------------------------------------- Progress Note Details Patient Name: Date of Service: Cathy Gonzalez. 12/19/2020 3:30 PM Medical Record Number: [id number] Patient Account Number: [id number] Date of Birth/Sex: Treating RN: [birth year] (28 y.o. [name] Primary Care Provider: [name] Other Clinician: Referring Provider: Treating Provider/Extender: [name] in Treatment: 4 Subjective Chief Complaint Information obtained from Patient Left LE Ulcer History of Present Illness (HPI) 11/21/2020 upon evaluation today patient appears to be doing well with regard to her wound all things considered. With that being said she has been tolerating the dressing changes without complication. Fortunately there is no signs of active infection at this time which is great news. No fevers, chills, nausea, vomiting, or diarrhea. In general I am extremely happy with the fact that the patient seems to be making such good progress here. She tells me it has come along way from where it started. Nonetheless it has been about a month and still not closed. She does have chronic venous  stasis, diabetes mellitus type 2, her most recent hemoglobin A1c was 6.5 although I am not exactly sure of the date on that. That is just per the records reviewed. She does have hypertension and chronic viral hepatitis C as well. She also has neuropathy in her feet. This apparently is secondary to the diabetes. There does not appear to be any signs of active infection right now which is great news she has been on 2 rounds of Keflex as prescribed by the primary care provider I see no signs of infection at this point. The patient did have use of Voltaren gel as well as Thera works that she had utilized prior to this starting and she feels like that may have caused the issue. 11/28/2020 upon evaluation today patient appears to be doing well in regard to her leg ulcer. I am actually seeing signs of good improvement here which is excellent news. There does not appear to be any evidence of active infection which is great news. I think the collagen is doing a good job. 12/12/2020 upon evaluation today patient appears to be doing well with regard to her wound on the leg. This is showing signs of good improvement which is excellent news and there does not appear to be any evidence of active infection at this point. No fevers, chills, nausea, vomiting, or diarrhea. 12/19/2020 upon evaluation today patient appears to be doing well with regard to her leg ulcer I see no signs of infection at this point which is great news. In general I am extremely pleased with where we stand today. No fevers, chills, nausea, vomiting, or diarrhea. Objective Constitutional Well-nourished and well-hydrated in no acute distress. Vitals Time Taken: 3:55 PM, Height: 64 in, Weight: 225 lbs, BMI: 38.6, Temperature: 98.4 F, Pulse: 94 bpm, Respiratory Rate: 18 breaths/min, Blood Pressure: 152/84 mmHg, Capillary Blood Glucose: 127 mg/dl. Respiratory normal breathing without difficulty. Psychiatric this patient is able to make decisions  and demonstrates good insight into disease process. Alert and Oriented x  3. pleasant and cooperative. General Notes: Patient's wound bed actually showed evidence of good granulation epithelization at this point. With that being said the collagen seems to be getting stuck which is not ideal. For that reason we will get a see what we can do as far as trying to cut back on any dressings that would just dry out and get stuck to the wound bed. For that reason we are going to proceed with a silver alginate dressing to not stick to the wound and hopefully keep this nice and dry so that it will not stick to the wound bed and hopefully allow this to seal up finally. Integumentary (Hair, Skin) Wound #1 status is Open. Original cause of wound was Gradually Appeared. The date acquired was: 10/07/2019. The wound has been in treatment 4 weeks. The wound is located on the Left,Anterior Lower Leg. The wound measures 0.3cm length x 0.3cm width x 0.1cm depth; 0.071cm^2 area and 0.007cm^3 volume. There is Fat Layer (Subcutaneous Tissue) exposed. There is no tunneling or undermining noted. There is a small amount of serosanguineous drainage noted. The wound margin is flat and intact. There is large (67-100%) pink granulation within the wound bed. There is no necrotic tissue within the wound bed. Assessment Active Problems ICD-10 Venous insufficiency (chronic) (peripheral) Non-pressure chronic ulcer of other part of left lower leg with fat layer exposed Type 2 diabetes mellitus with other skin ulcer Type 2 diabetes mellitus with diabetic neuropathy, unspecified Essential (primary) hypertension Chronic viral hepatitis C Procedures Wound #1 Pre-procedure diagnosis of Wound #1 is a Venous Leg Ulcer located on the Left,Anterior Lower Leg . There was a Three Layer Compression Therapy Procedure by Rhae Hammock, RN. Post procedure Diagnosis Wound #1: Same as Pre-Procedure Plan Follow-up Appointments: Return  Appointment in 1 week. Bathing/ Shower/ Hygiene: May shower with protection but do not get wound dressing(s) wet. - use cast protector over wrap to protect in shower Edema Control - Lymphedema / SCD / Other: Elevate legs to the level of the heart or above for 30 minutes daily and/or when sitting, a frequency of: - throughout the day Avoid standing for long periods of time. Exercise regularly WOUND #1: - Lower Leg Wound Laterality: Left, Anterior Peri-Wound Care: Sween Lotion (Moisturizing lotion) 1 x Per Week/7 Days Discharge Instructions: Apply moisturizing lotion to leg Prim Dressing: KerraCel Ag Gelling Fiber Dressing, 2x2 in (silver alginate) 1 x Per Week/7 Days ary Discharge Instructions: Apply silver alginate to wound bed as instructed Secondary Dressing: Woven Gauze Sponge, Non-Sterile 4x4 in 1 x Per Week/7 Days Discharge Instructions: Apply over primary dressing as directed. Com pression Wrap: ThreePress (3 layer compression wrap) 1 x Per Week/7 Days Discharge Instructions: Apply three layer compression as directed. Com pression Stockings: Carolon Multi Layer Compression Stocking Compression Amount: 30-40 mmHg (left) Discharge Instructions: Apply compression stocking daily as instructed. Apply first thing in the morning, remove at night before bed. 1. Would recommend currently that we continue with the silver alginate dressing or rather initiate that and subsequently we are going to definitely continue with the compression wrap that seems to be doing a good job. This is a 3 layer compression wrap will utilize it at this point. 2. Also recommend that the patient continue to elevate her legs much as possible try to keep edema under good control. She use her compression stocking for the opposite leg as normal. We will see patient back for reevaluation in 1 week here in the clinic. If anything worsens or  changes patient will contact our office for additional recommendations. Electronic  Signature(s) Signed: 12/19/2020 5:49:38 PM By: Worthy Keeler PA-C Entered By: Worthy Keeler on 12/19/2020 17:49:38 -------------------------------------------------------------------------------- SuperBill Details Patient Name: Date of Service: Cathy Gonzalez 12/19/2020 Medical Record Number: [id number] Patient Account Number: [id number] Date of Birth/Sex: Treating RN: [birth year] (80 y.o. [name] Primary Care Provider: [name] Other Clinician: Referring Provider: Treating Provider/Extender: [name] in Treatment: 4 Diagnosis Coding ICD-10 Codes Code Description I87.2 Venous insufficiency (chronic) (peripheral) L97.822 Non-pressure chronic ulcer of other part of left lower leg with fat layer exposed E11.622 Type 2 diabetes mellitus with other skin ulcer E11.40 Type 2 diabetes mellitus with diabetic neuropathy, unspecified I10 Essential (primary) hypertension B18.2 Chronic viral hepatitis C Facility Procedures CPT4 Code: [id number] Description: (Facility Use Only) [phone] - [facility] LWR LT LEG Modifier: Quantity: 1 Physician Procedures : CPT4 Code Description Modifier [id number] - WC PHYS LEVEL 3 - EST PT ICD-10 Diagnosis Description I87.2 Venous insufficiency (chronic) (peripheral) L97.822 Non-pressure chronic ulcer of other part of left lower leg with fat layer exposed E11.622  Type 2 diabetes mellitus with other skin ulcer E11.40 Type 2 diabetes mellitus with diabetic neuropathy, unspecified Quantity: 1 Electronic Signature(s) Signed: 12/19/2020 5:49:56 PM By: Worthy Keeler PA-C Entered By: Worthy Keeler on 12/19/2020 17:49:56

## 2020-12-20 NOTE — Progress Notes (Signed)
Cathy Gonzalez ([id number]) Visit Report for 12/19/2020 Arrival Information Details Patient Name: Date of Service: Cathy Gonzalez 12/19/2020 3:30 PM Medical Record Number: [id number] Patient Account Number: [id number] Date of Birth/Sex: Treating RN: 12-27-1945 (75 y.o. Cathy Gonzalez, Meta.Reding Primary Care Dennice Tindol: [name] Other Clinician: Referring Teretha Chalupa: Treating Huxton Glaus/Extender: Katherine Roan in Treatment: 4 Visit Information History Since Last Visit Added or deleted any medications: No Patient Arrived: Ambulatory Any new allergies or adverse reactions: No Arrival Time: 15:55 Had a fall or experienced change in No Accompanied By: self activities of daily living that may affect Transfer Assistance: None risk of falls: Patient Identification Verified: Yes Signs or symptoms of abuse/neglect since last visito No Secondary Verification Process Completed: Yes Hospitalized since last visit: No Patient Requires Transmission-Based Precautions: No Implantable device outside of the clinic excluding No Patient Has Alerts: No cellular tissue based products placed in the center since last visit: Has Dressing in Place as Prescribed: Yes Has Compression in Place as Prescribed: Yes Pain Present Now: No Electronic Signature(s) Signed: 12/19/2020 4:41:44 PM By: Deon Pilling Entered By: Deon Pilling on 12/19/2020 16:04:23 -------------------------------------------------------------------------------- Compression Therapy Details Patient Name: Date of Service: Cathy Gonzalez 12/19/2020 3:30 PM Medical Record Number: [id number] Patient Account Number: [id number] Date of Birth/Sex: Treating RN: 05-22-1946 (75 y.o. Cathy Gonzalez Primary Care Lorenza Winkleman: [name] Other Clinician: Referring Tashan Kreitzer: Treating Emaad Nanna/Extender: [name] in Treatment: 4 Compression Therapy Performed for Wound Assessment: Wound #1 Left,Anterior  Lower Leg Performed By: Clinician Rhae Hammock, RN Compression Type: Three Layer Post Procedure Diagnosis Same as Pre-procedure Electronic Signature(s) Signed: 12/20/2020 5:44:19 PM By: Baruch Gouty RN, BSN Entered By: Baruch Gouty on 12/19/2020 16:34:49 -------------------------------------------------------------------------------- Encounter Discharge Information Details Patient Name: Date of Service: Cathy Gonzalez 12/19/2020 3:30 PM Medical Record Number: [id number] Patient Account Number: [id number] Date of Birth/Sex: Treating RN: Aug 14, 1946 (75 y.o. [name] Primary Care Jiya Kissinger: [name] Other Clinician: Referring Shaton Lore: Treating Arah Aro/Extender: Katherine Roan in Treatment: 4 Encounter Discharge Information Items Discharge Condition: Stable Ambulatory Status: Ambulatory Discharge Destination: Home Transportation: Private Auto Accompanied By: alone Schedule Follow-up Appointment: Yes Clinical Summary of Care: Patient Declined Electronic Signature(s) Signed: 12/20/2020 5:54:24 PM By: Levan Hurst RN, BSN Entered By: Levan Hurst on 12/19/2020 17:02:24 -------------------------------------------------------------------------------- Lower Extremity Assessment Details Patient Name: Date of Service: Cathy Gonzalez. 12/19/2020 3:30 PM Medical Record Number: [id number] Patient Account Number: [id number] Date of Birth/Sex: Treating RN: November 22, 1945 (75 y.o. Cathy Gonzalez, Cathy Gonzalez Primary Care Glendora Clouatre: [name] Other Clinician: Referring Kamron Portee: Treating Marites Nath/Extender: [name] in Treatment: 4 Edema Assessment Assessed: [Left: Yes] [Right: No] Edema: [Left: Ye] [Right: s] Calf Left: Right: Point of Measurement: 31 cm From Medial Instep 40.2 cm Ankle Left: Right: Point of Measurement: 8 cm From Medial Instep 25.5 cm Vascular Assessment Pulses: Dorsalis Pedis Palpable:  [Left:Yes] Electronic Signature(s) Signed: 12/19/2020 4:41:44 PM By: Deon Pilling Entered By: Deon Pilling on 12/19/2020 16:04:54 -------------------------------------------------------------------------------- Multi-Disciplinary Care Plan Details Patient Name: Date of Service: Cathy Gonzalez. 12/19/2020 3:30 PM Medical Record Number: [id number] Patient Account Number: [id number] Date of Birth/Sex: Treating RN: [birth year] (75 y.o. Cathy Gonzalez Primary Care Dayvian Blixt: [name] Other Clinician: Referring Geetika Laborde: Treating Kamy Poinsett/Extender: Katherine Roan in Treatment: 4 Multidisciplinary Care Plan reviewed with physician Active Inactive Nutrition Nursing Diagnoses:  Impaired glucose control: actual or potential Potential for alteratiion in Nutrition/Potential for imbalanced nutrition Goals: Patient/caregiver will maintain therapeutic glucose control Date Initiated: 11/21/2020 Target Resolution Date: 12/19/2020 Goal Status: Active Interventions: Assess patient nutrition upon admission and as needed per policy Provide education on elevated blood sugars and impact on wound healing Treatment Activities: Patient referred to Primary Care Physician for further nutritional evaluation : 11/21/2020 Notes: Venous Leg Ulcer Nursing Diagnoses: Knowledge deficit related to disease process and management Potential for venous Insuffiency (use before diagnosis confirmed) Goals: Patient will maintain optimal edema control Date Initiated: 11/21/2020 Target Resolution Date: 01/16/2021 Goal Status: Active Interventions: Assess peripheral edema status every visit. Compression as ordered Provide education on venous insufficiency Treatment Activities: Therapeutic compression applied : 11/21/2020 Notes: Wound/Skin Impairment Nursing Diagnoses: Impaired tissue integrity Knowledge deficit related to ulceration/compromised skin integrity Goals: Patient/caregiver  will verbalize understanding of skin care regimen Date Initiated: 11/21/2020 Target Resolution Date: 01/16/2021 Goal Status: Active Ulcer/skin breakdown will have a volume reduction of 30% by week 4 Date Initiated: 11/21/2020 Date Inactivated: 12/19/2020 Target Resolution Date: 01/16/2021 Goal Status: Met Ulcer/skin breakdown will have a volume reduction of 50% by week 8 Date Initiated: 12/19/2020 Target Resolution Date: 01/16/2021 Goal Status: Active Interventions: Assess patient/caregiver ability to obtain necessary supplies Assess patient/caregiver ability to perform ulcer/skin care regimen upon admission and as needed Assess ulceration(s) every visit Treatment Activities: Skin care regimen initiated : 11/21/2020 Topical wound management initiated : 11/21/2020 Notes: Electronic Signature(s) Signed: 12/20/2020 5:44:19 PM By: Baruch Gouty RN, BSN Entered By: Baruch Gouty on 12/19/2020 16:33:51 -------------------------------------------------------------------------------- Pain Assessment Details Patient Name: Date of Service: Cathy Gonzalez. 12/19/2020 3:30 PM Medical Record Number: [id number] Patient Account Number: [id number] Date of Birth/Sex: Treating RN: 03-01-1946 (75 y.o. Debby Bud Primary Care Lailanie Hasley: [name] Other Clinician: Referring Seraphim Trow: Treating Cameran Ahmed/Extender: [name] in Treatment: 4 Active Problems Location of Pain Severity and Description of Pain Patient Has Paino No Site Locations Rate the pain. Current Pain Level: 0 Pain Management and Medication Current Pain Management: Medication: No Cold Application: No Rest: No Massage: No Activity: No T.E.N.S.: No Heat Application: No Leg drop or elevation: No Is the Current Pain Management Adequate: Adequate How does your wound impact your activities of daily livingo Sleep: No Bathing: No Appetite: No Relationship With Others: No Bladder Continence:  No Emotions: No Bowel Continence: No Work: No Toileting: No Drive: No Dressing: No Hobbies: No Electronic Signature(s) Signed: 12/19/2020 4:41:44 PM By: Deon Pilling Signed: 12/19/2020 4:41:44 PM By: Deon Pilling Entered By: Deon Pilling on 12/19/2020 16:04:44 -------------------------------------------------------------------------------- Patient/Caregiver Education Details Patient Name: Date of Service: Cathy Gonzalez 3/16/2022andnbsp3:30 PM Medical Record Number: [id number] Patient Account Number: [id number] Date of Birth/Gender: Treating RN: 22-Jul-1946 (23 y.o. Cathy Gonzalez Primary Care Physician: [name] Other Clinician: Referring Physician: Treating Physician/Extender: Katherine Roan in Treatment: 4 Education Assessment Education Provided To: Patient Education Topics Provided Venous: Methods: Explain/Verbal Responses: Reinforcements needed, State content correctly Electronic Signature(s) Signed: 12/20/2020 5:44:19 PM By: Baruch Gouty RN, BSN Entered By: Baruch Gouty on 12/19/2020 16:34:14 -------------------------------------------------------------------------------- Wound Assessment Details Patient Name: Date of Service: Cathy Gonzalez. 12/19/2020 3:30 PM Medical Record Number: [id number] Patient Account Number: [id number] Date of Birth/Sex: Treating RN: 1945/11/05 (79 y.o. Debby Bud Primary Care Gladyce Mcray: [name] Other Clinician: Referring Leveda Kendrix: Treating Meghana Tullo/Extender: [name] in Treatment: 4 Wound Status Wound  Number: 1 Primary Venous Leg Ulcer Etiology: Wound Location: Left, Anterior Lower Leg Wound Open Wounding Event: Gradually Appeared Status: Date Acquired: 10/07/2019 Comorbid Sleep Apnea, Hypertension, Hepatitis C, Type II Diabetes, Gout, Weeks Of Treatment: 4 History: Osteoarthritis, Neuropathy, Seizure Disorder Clustered Wound: No Photos Wound  Measurements Length: (cm) 0.3 Width: (cm) 0.3 Depth: (cm) 0.1 Area: (cm) 0.071 Volume: (cm) 0.007 % Reduction in Area: 54.8% % Reduction in Volume: 56.3% Epithelialization: Medium (34-66%) Tunneling: No Undermining: No Wound Description Classification: Full Thickness Without Exposed Support Structures Wound Margin: Flat and Intact Exudate Amount: Small Exudate Type: Serosanguineous Exudate Color: red, brown Foul Odor After Cleansing: No Slough/Fibrino Yes Wound Bed Granulation Amount: Large (67-100%) Exposed Structure Granulation Quality: Pink Fascia Exposed: No Necrotic Amount: None Present (0%) Fat Layer (Subcutaneous Tissue) Exposed: Yes Tendon Exposed: No Muscle Exposed: No Joint Exposed: No Bone Exposed: No Treatment Notes Wound #1 (Lower Leg) Wound Laterality: Left, Anterior Cleanser Peri-Wound Care Sween Lotion (Moisturizing lotion) Discharge Instruction: Apply moisturizing lotion to leg Topical Primary Dressing KerraCel Ag Gelling Fiber Dressing, 2x2 in (silver alginate) Discharge Instruction: Apply silver alginate to wound bed as instructed Secondary Dressing Woven Gauze Sponge, Non-Sterile 4x4 in Discharge Instruction: Apply over primary dressing as directed. Secured With Compression Wrap ThreePress (3 layer compression wrap) Discharge Instruction: Apply three layer compression as directed. Compression Stockings Carolon Multi Layer Compression Stocking Quantity: 1 Left Leg Compression Amount: 30-40 mmHg Discharge Instruction: Apply compression stocking daily as instructed. Apply first thing in the morning, remove at night before bed. Add-Ons Electronic Signature(s) Signed: 12/19/2020 4:41:44 PM By: Deon Pilling Signed: 12/20/2020 12:58:14 PM By: Sandre Kitty Entered By: Sandre Kitty on 12/19/2020 16:39:39 -------------------------------------------------------------------------------- [place] Details Patient Name: Date of Service: Drue Stager, Desmond Dike. 12/19/2020 3:30 PM Medical Record Number: [id number] Patient Account Number: [id number] Date of Birth/Sex: Treating RN: 1946/09/23 (75 y.o. Cathy Gonzalez, Meta.Reding Primary Care Pascha Fogal: [name] Other Clinician: Referring Viva Gallaher: Treating Deya Bigos/Extender: [name] in Treatment: 4 Vital Signs Time Taken: 15:55 Temperature (F): 98.4 Height (in): 64 Pulse (bpm): 94 Weight (lbs): 225 Respiratory Rate (breaths/min): 18 Body Mass Index (BMI): 38.6 Blood Pressure (mmHg): 152/84 Capillary Blood Glucose (mg/dl): 127 Reference Range: 80 - 120 mg / dl Electronic Signature(s) Signed: 12/19/2020 4:41:44 PM By: Deon Pilling Entered By: Deon Pilling on 12/19/2020 16:04:36

## 2020-12-26 ENCOUNTER — Encounter (HOSPITAL_BASED_OUTPATIENT_CLINIC_OR_DEPARTMENT_OTHER): Payer: HMO | Admitting: Physician Assistant

## 2020-12-26 ENCOUNTER — Other Ambulatory Visit: Payer: Self-pay

## 2020-12-26 DIAGNOSIS — E11622 Type 2 diabetes mellitus with other skin ulcer: Secondary | ICD-10-CM | POA: Diagnosis not present

## 2020-12-26 DIAGNOSIS — F4320 Adjustment disorder, unspecified: Secondary | ICD-10-CM | POA: Diagnosis not present

## 2020-12-26 DIAGNOSIS — E1165 Type 2 diabetes mellitus with hyperglycemia: Secondary | ICD-10-CM | POA: Diagnosis not present

## 2020-12-26 DIAGNOSIS — I872 Venous insufficiency (chronic) (peripheral): Secondary | ICD-10-CM | POA: Diagnosis not present

## 2020-12-26 DIAGNOSIS — E1169 Type 2 diabetes mellitus with other specified complication: Secondary | ICD-10-CM | POA: Diagnosis not present

## 2020-12-26 DIAGNOSIS — L97822 Non-pressure chronic ulcer of other part of left lower leg with fat layer exposed: Secondary | ICD-10-CM | POA: Diagnosis not present

## 2020-12-26 DIAGNOSIS — Z634 Disappearance and death of family member: Secondary | ICD-10-CM | POA: Diagnosis not present

## 2020-12-26 DIAGNOSIS — F410 Panic disorder [episodic paroxysmal anxiety] without agoraphobia: Secondary | ICD-10-CM | POA: Diagnosis not present

## 2020-12-26 DIAGNOSIS — R410 Disorientation, unspecified: Secondary | ICD-10-CM | POA: Diagnosis not present

## 2020-12-26 DIAGNOSIS — I739 Peripheral vascular disease, unspecified: Secondary | ICD-10-CM | POA: Diagnosis not present

## 2020-12-26 DIAGNOSIS — R600 Localized edema: Secondary | ICD-10-CM | POA: Diagnosis not present

## 2020-12-26 DIAGNOSIS — G629 Polyneuropathy, unspecified: Secondary | ICD-10-CM | POA: Diagnosis not present

## 2020-12-26 NOTE — Progress Notes (Addendum)
ZANDREA, KENEALY ([id number]) Visit Report for 12/26/2020 Chief Complaint Document Details Patient Name: Date of Service: Cathy Gonzalez 12/26/2020 10:00 A M Medical Record Number: [id number] Patient Account Number: [id number] Date of Birth/Sex: Treating RN: 1946-02-09 (75 y.o. [name] Primary Care Provider: [name] Other Clinician: Referring Provider: Treating Provider/Extender: [name] in Treatment: 5 Information Obtained from: Patient Chief Complaint Left LE Ulcer Electronic Signature(s) Signed: 12/26/2020 10:44:41 AM By: Worthy Keeler PA-C Entered By: Worthy Keeler on 12/26/2020 10:44:41 -------------------------------------------------------------------------------- HPI Details Patient Name: Date of Service: Cathy Gonzalez. 12/26/2020 10:00 A M Medical Record Number: [id number] Patient Account Number: [id number] Date of Birth/Sex: Treating RN: September 03, 1946 (45 y.o. [name] Primary Care Provider: [name] Other Clinician: Referring Provider: Treating Provider/Extender: [name] in Treatment: 5 History of Present Illness HPI Description: 11/21/2020 upon evaluation today patient appears to be doing well with regard to her wound all things considered. With that being said she has been tolerating the dressing changes without complication. Fortunately there is no signs of active infection at this time which is great news. No fevers, chills, nausea, vomiting, or diarrhea. In general I am extremely happy with the fact that the patient seems to be making such good progress here. She tells me it has come along way from where it started. Nonetheless it has been about a month and still not closed. She does have chronic venous stasis, diabetes mellitus type 2, her most recent hemoglobin A1c was 6.5 although I am not exactly sure of the date on that. That is just per the records reviewed. She does  have hypertension and chronic viral hepatitis C as well. She also has neuropathy in her feet. This apparently is secondary to the diabetes. There does not appear to be any signs of active infection right now which is great news she has been on 2 rounds of Keflex as prescribed by the primary care provider I see no signs of infection at this point. The patient did have use of Voltaren gel as well as Thera works that she had utilized prior to this starting and she feels like that may have caused the issue. 11/28/2020 upon evaluation today patient appears to be doing well in regard to her leg ulcer. I am actually seeing signs of good improvement here which is excellent news. There does not appear to be any evidence of active infection which is great news. I think the collagen is doing a good job. 12/12/2020 upon evaluation today patient appears to be doing well with regard to her wound on the leg. This is showing signs of good improvement which is excellent news and there does not appear to be any evidence of active infection at this point. No fevers, chills, nausea, vomiting, or diarrhea. 12/19/2020 upon evaluation today patient appears to be doing well with regard to her leg ulcer I see no signs of infection at this point which is great news. In general I am extremely pleased with where we stand today. No fevers, chills, nausea, vomiting, or diarrhea. 12/26/2020 on evaluation today patient appears to be doing excellent in regard to her wound. Unfortunately we were hoping this will be healed today and does not appear to be the case. There does not appear to be any signs of active infection systemically at this time which is great news and overall very pleased in that regard. No fevers, chills,  nausea, vomiting, or diarrhea. Electronic Signature(s) Signed: 12/26/2020 10:52:16 AM By: Worthy Keeler PA-C Entered By: Worthy Keeler on 12/26/2020  10:52:16 -------------------------------------------------------------------------------- Physical Exam Details Patient Name: Date of Service: Cathy Gonzalez 12/26/2020 10:00 A M Medical Record Number: [id number] Patient Account Number: [id number] Date of Birth/Sex: Treating RN: 1946/09/07 (66 y.o. [name] Primary Care Provider: [name] Other Clinician: Referring Provider: Treating Provider/Extender: [name] in Treatment: 5 Constitutional Well-nourished and well-hydrated in no acute distress. Respiratory normal breathing without difficulty. Psychiatric this patient is able to make decisions and demonstrates good insight into disease process. Alert and Oriented x 3. pleasant and cooperative. Notes Upon inspection patient's wound bed showed signs of just a little bit of dry skin over the surface at all so looks like some of the alginate may have gotten a little bit stuck to the wound bed. Subsequently I was able to remove this and there is just a very tiny opening. There does not appear to be any evidence of infection which is great news. Electronic Signature(s) Signed: 12/26/2020 10:52:40 AM By: Worthy Keeler PA-C Entered By: Worthy Keeler on 12/26/2020 10:52:40 -------------------------------------------------------------------------------- Physician Orders Details Patient Name: Date of Service: Cathy Gonzalez. 12/26/2020 10:00 A M Medical Record Number: [id number] Patient Account Number: [id number] Date of Birth/Sex: Treating RN: 10/29/1945 (64 y.o. [name] Primary Care Provider: [name] Other Clinician: Referring Provider: Treating Provider/Extender: [name] in Treatment: 5 Verbal / Phone Orders: No Diagnosis Coding ICD-10 Coding Code Description I87.2 Venous insufficiency (chronic) (peripheral) L97.822 Non-pressure chronic ulcer of other part of left lower leg with fat layer  exposed E11.622 Type 2 diabetes mellitus with other skin ulcer E11.40 Type 2 diabetes mellitus with diabetic neuropathy, unspecified I10 Essential (primary) hypertension B18.2 Chronic viral hepatitis C Follow-up Appointments Return Appointment in 1 week. Bathing/ Shower/ Hygiene May shower with protection but do not get wound dressing(s) wet. - use cast protector over wrap to protect in shower Edema Control - Lymphedema / SCD / Other Bilateral Lower Extremities Elevate legs to the level of the heart or above for 30 minutes daily and/or when sitting, a frequency of: - throughout the day Avoid standing for long periods of time. Exercise regularly Wound Treatment Wound #1 - Lower Leg Wound Laterality: Left, Anterior Peri-Wound Care: Sween Lotion (Moisturizing lotion) 1 x Per Week/7 Days Discharge Instructions: Apply moisturizing lotion to leg Prim Dressing: ADAPTIC TOUCH 3x4.25 (in/in) 1 x Per Week/7 Days ary Discharge Instructions: Apply to wound bed as instructed Secondary Dressing: Woven Gauze Sponge, Non-Sterile 4x4 in 1 x Per Week/7 Days Discharge Instructions: Apply over primary dressing as directed. Compression Wrap: ThreePress (3 layer compression wrap) 1 x Per Week/7 Days Discharge Instructions: Apply three layer compression as directed. Compression Stockings: Carolon Multi Layer Compression Stocking Left Leg Compression Amount: 30-40 mmHG Discharge Instructions: Apply compression stocking daily as instructed. Apply first thing in the morning, remove at night before bed. Electronic Signature(s) Signed: 12/26/2020 5:29:52 PM By: Worthy Keeler PA-C Signed: 12/26/2020 5:47:10 PM By: Baruch Gouty RN, BSN Entered By: Baruch Gouty on 12/26/2020 10:48:42 -------------------------------------------------------------------------------- Problem List Details Patient Name: Date of Service: Cathy Cota N. 12/26/2020 10:00 A M Medical Record Number: [id number] Patient Account  Number: [id number] Date of Birth/Sex: Treating RN: 07-20-1946 (65 y.o. [name] Primary Care Provider: [name] Other Clinician: Referring Provider: Treating Provider/Extender: Melburn Hake,  Lovey Newcomer, Earley Brooke Weeks in Treatment: 5 Active Problems ICD-10 Encounter Code Description Active Date MDM Diagnosis I87.2 Venous insufficiency (chronic) (peripheral) 11/21/2020 No Yes L97.822 Non-pressure chronic ulcer of other part of left lower leg with fat layer exposed2/16/2022 No Yes E11.622 Type 2 diabetes mellitus with other skin ulcer 11/21/2020 No Yes E11.40 Type 2 diabetes mellitus with diabetic neuropathy, unspecified 11/21/2020 No Yes I10 Essential (primary) hypertension 11/21/2020 No Yes B18.2 Chronic viral hepatitis C 11/21/2020 No Yes Inactive Problems Resolved Problems Electronic Signature(s) Signed: 12/26/2020 10:44:36 AM By: Worthy Keeler PA-C Entered By: Worthy Keeler on 12/26/2020 10:44:36 -------------------------------------------------------------------------------- Progress Note Details Patient Name: Date of Service: Cathy Gonzalez. 12/26/2020 10:00 A M Medical Record Number: [id number] Patient Account Number: [id number] Date of Birth/Sex: Treating RN: 12/28/1945 (40 y.o. [name] Primary Care Provider: [name] Other Clinician: Referring Provider: Treating Provider/Extender: [name] in Treatment: 5 Subjective Chief Complaint Information obtained from Patient Left LE Ulcer History of Present Illness (HPI) 11/21/2020 upon evaluation today patient appears to be doing well with regard to her wound all things considered. With that being said she has been tolerating the dressing changes without complication. Fortunately there is no signs of active infection at this time which is great news. No fevers, chills, nausea, vomiting, or diarrhea. In general I am extremely happy with the fact that the patient seems to be  making such good progress here. She tells me it has come along way from where it started. Nonetheless it has been about a month and still not closed. She does have chronic venous stasis, diabetes mellitus type 2, her most recent hemoglobin A1c was 6.5 although I am not exactly sure of the date on that. That is just per the records reviewed. She does have hypertension and chronic viral hepatitis C as well. She also has neuropathy in her feet. This apparently is secondary to the diabetes. There does not appear to be any signs of active infection right now which is great news she has been on 2 rounds of Keflex as prescribed by the primary care provider I see no signs of infection at this point. The patient did have use of Voltaren gel as well as Thera works that she had utilized prior to this starting and she feels like that may have caused the issue. 11/28/2020 upon evaluation today patient appears to be doing well in regard to her leg ulcer. I am actually seeing signs of good improvement here which is excellent news. There does not appear to be any evidence of active infection which is great news. I think the collagen is doing a good job. 12/12/2020 upon evaluation today patient appears to be doing well with regard to her wound on the leg. This is showing signs of good improvement which is excellent news and there does not appear to be any evidence of active infection at this point. No fevers, chills, nausea, vomiting, or diarrhea. 12/19/2020 upon evaluation today patient appears to be doing well with regard to her leg ulcer I see no signs of infection at this point which is great news. In general I am extremely pleased with where we stand today. No fevers, chills, nausea, vomiting, or diarrhea. 12/26/2020 on evaluation today patient appears to be doing excellent in regard to her wound. Unfortunately we were hoping this will be healed today and does not appear to be the case. There does not appear to be any  signs  of active infection systemically at this time which is great news and overall very pleased in that regard. No fevers, chills, nausea, vomiting, or diarrhea. Objective Constitutional Well-nourished and well-hydrated in no acute distress. Vitals Time Taken: 10:30 AM, Height: 64 in, Weight: 225 lbs, BMI: 38.6, Temperature: 98.3 F, Pulse: 87 bpm, Respiratory Rate: 16 breaths/min, Blood Pressure: 146/82 mmHg, Capillary Blood Glucose: 120 mg/dl. Respiratory normal breathing without difficulty. Psychiatric this patient is able to make decisions and demonstrates good insight into disease process. Alert and Oriented x 3. pleasant and cooperative. General Notes: Upon inspection patient's wound bed showed signs of just a little bit of dry skin over the surface at all so looks like some of the alginate may have gotten a little bit stuck to the wound bed. Subsequently I was able to remove this and there is just a very tiny opening. There does not appear to be any evidence of infection which is great news. Integumentary (Hair, Skin) Wound #1 status is Open. Original cause of wound was Gradually Appeared. The date acquired was: 10/07/2019. The wound has been in treatment 5 weeks. The wound is located on the Left,Anterior Lower Leg. The wound measures 0.1cm length x 0.1cm width x 0.1cm depth; 0.008cm^2 area and 0.001cm^3 volume. There is Fat Layer (Subcutaneous Tissue) exposed. There is no tunneling or undermining noted. There is a none present amount of drainage noted. The wound margin is flat and intact. There is no granulation within the wound bed. There is no necrotic tissue within the wound bed. Assessment Active Problems ICD-10 Venous insufficiency (chronic) (peripheral) Non-pressure chronic ulcer of other part of left lower leg with fat layer exposed Type 2 diabetes mellitus with other skin ulcer Type 2 diabetes mellitus with diabetic neuropathy, unspecified Essential (primary)  hypertension Chronic viral hepatitis C Procedures Wound #1 Pre-procedure diagnosis of Wound #1 is a Venous Leg Ulcer located on the Left,Anterior Lower Leg . There was a Three Layer Compression Therapy Procedure by Rhae Hammock, RN. Post procedure Diagnosis Wound #1: Same as Pre-Procedure Plan Follow-up Appointments: Return Appointment in 1 week. Bathing/ Shower/ Hygiene: May shower with protection but do not get wound dressing(s) wet. - use cast protector over wrap to protect in shower Edema Control - Lymphedema / SCD / Other: Elevate legs to the level of the heart or above for 30 minutes daily and/or when sitting, a frequency of: - throughout the day Avoid standing for long periods of time. Exercise regularly WOUND #1: - Lower Leg Wound Laterality: Left, Anterior Peri-Wound Care: Sween Lotion (Moisturizing lotion) 1 x Per Week/7 Days Discharge Instructions: Apply moisturizing lotion to leg Prim Dressing: ADAPTIC TOUCH 3x4.25 (in/in) 1 x Per Week/7 Days ary Discharge Instructions: Apply to wound bed as instructed Secondary Dressing: Woven Gauze Sponge, Non-Sterile 4x4 in 1 x Per Week/7 Days Discharge Instructions: Apply over primary dressing as directed. Com pression Wrap: ThreePress (3 layer compression wrap) 1 x Per Week/7 Days Discharge Instructions: Apply three layer compression as directed. Com pression Stockings: Carolon Multi Layer Compression Stocking Compression Amount: 30-40 mmHg (left) Discharge Instructions: Apply compression stocking daily as instructed. Apply first thing in the morning, remove at night before bed. 1. Would recommend that we apply Adaptic to the wound bed. 2. I am also can recommend that we on top of this utilize a gauze followed by 3 layer compression wrap to help with edema control. 3. The patient does have compression stockings for which she heals. We will see patient back for reevaluation in 1  week here in the clinic. If anything worsens or  changes patient will contact our office for additional recommendations. Electronic Signature(s) Signed: 12/26/2020 10:53:11 AM By: Worthy Keeler PA-C Entered By: Worthy Keeler on 12/26/2020 10:53:11 -------------------------------------------------------------------------------- SuperBill Details Patient Name: Date of Service: Cathy Gonzalez 12/26/2020 Medical Record Number: [id number] Patient Account Number: [id number] Date of Birth/Sex: Treating RN: 1945-11-25 (45 y.o. [name] Primary Care Provider: [name] Other Clinician: Referring Provider: Treating Provider/Extender: [name] in Treatment: 5 Diagnosis Coding ICD-10 Codes Code Description I87.2 Venous insufficiency (chronic) (peripheral) L97.822 Non-pressure chronic ulcer of other part of left lower leg with fat layer exposed E11.622 Type 2 diabetes mellitus with other skin ulcer E11.40 Type 2 diabetes mellitus with diabetic neuropathy, unspecified I10 Essential (primary) hypertension B18.2 Chronic viral hepatitis C Facility Procedures CPT4 Code: [id number] Description: (Facility Use Only) [phone] - APPLY MULTLAY COMPRS LWR LT LEG Modifier: Quantity: 1 Physician Procedures : CPT4 Code Description Modifier [id number] - WC PHYS LEVEL 3 - EST PT ICD-10 Diagnosis Description I87.2 Venous insufficiency (chronic) (peripheral) L97.822 Non-pressure chronic ulcer of other part of left lower leg with fat layer exposed E11.622  Type 2 diabetes mellitus with other skin ulcer E11.40 Type 2 diabetes mellitus with diabetic neuropathy, unspecified Quantity: 1 Electronic Signature(s) Signed: 12/26/2020 10:53:22 AM By: Worthy Keeler PA-C Entered By: Worthy Keeler on 12/26/2020 10:53:22

## 2020-12-27 NOTE — Progress Notes (Signed)
Cathy, Gonzalez ([id number]) Visit Report for 12/26/2020 Arrival Information Details Patient Name: Date of Service: Cathy Gonzalez 12/26/2020 10:00 A M Medical Record Number: [id number] Patient Account Number: [id number] Date of Birth/Sex: Treating RN: [birth year] (75 y.o. Cathy Gonzalez, Meta.Reding Primary Care Jaque Dacy: [name] Other Clinician: Referring Willmer Fellers: Treating Rayden Scheper/Extender: Katherine Roan in Treatment: 5 Visit Information History Since Last Visit Added or deleted any medications: No Patient Arrived: Ambulatory Any new allergies or adverse reactions: No Arrival Time: 10:30 Had a fall or experienced change in No Accompanied By: self activities of daily living that may affect Transfer Assistance: None risk of falls: Patient Identification Verified: Yes Signs or symptoms of abuse/neglect since last visito No Secondary Verification Process Completed: Yes Hospitalized since last visit: No Patient Requires Transmission-Based Precautions: No Implantable device outside of the clinic excluding No Patient Has Alerts: No cellular tissue based products placed in the center since last visit: Has Dressing in Place as Prescribed: Yes Has Compression in Place as Prescribed: Yes Pain Present Now: No Electronic Signature(s) Signed: 12/26/2020 6:09:10 PM By: Deon Pilling Entered By: Deon Pilling on 12/26/2020 10:38:02 -------------------------------------------------------------------------------- Compression Therapy Details Patient Name: Date of Service: Cathy Cota N. 12/26/2020 10:00 A M Medical Record Number: [id number] Patient Account Number: [id number] Date of Birth/Sex: Treating RN: 14-Oct-1945 (75 y.o. [name] Primary Care Slade Pierpoint: [name] Other Clinician: Referring Matty Deamer: Treating Ariston Grandison/Extender: [name] in Treatment: 5 Compression Therapy Performed for Wound Assessment: Wound #1  Left,Anterior Lower Leg Performed By: Clinician Rhae Hammock, RN Compression Type: Three Layer Post Procedure Diagnosis Same as Pre-procedure Electronic Signature(s) Signed: 12/26/2020 5:47:10 PM By: Baruch Gouty RN, BSN Entered By: Baruch Gouty on 12/26/2020 10:47:20 -------------------------------------------------------------------------------- Encounter Discharge Information Details Patient Name: Date of Service: Cathy Gonzalez. 12/26/2020 10:00 A M Medical Record Number: [id number] Patient Account Number: [id number] Date of Birth/Sex: Treating RN: 1946/05/27 (75 y.o. Cathy Gonzalez Primary Care Alver Leete: [name] Other Clinician: Referring Demetri Goshert: Treating Jaryah Aracena/Extender: Katherine Roan in Treatment: 5 Encounter Discharge Information Items Discharge Condition: Stable Ambulatory Status: Ambulatory Discharge Destination: Home Transportation: Private Auto Accompanied By: alone Schedule Follow-up Appointment: Yes Clinical Summary of Care: Patient Declined Electronic Signature(s) Signed: 12/27/2020 5:04:27 PM By: Levan Hurst RN, BSN Entered By: Levan Hurst on 12/26/2020 11:53:01 -------------------------------------------------------------------------------- Lower Extremity Assessment Details Patient Name: Date of Service: Cathy Gonzalez. 12/26/2020 10:00 A M Medical Record Number: [id number] Patient Account Number: [id number] Date of Birth/Sex: Treating RN: 04-25-1946 (75 y.o. Cathy Gonzalez, [name] Primary Care Elea Holtzclaw: [name] Other Clinician: Referring Ellice Boultinghouse: Treating Eliyana Pagliaro/Extender: [name] in Treatment: 5 Edema Assessment Assessed: [Left: Yes] [Right: No] Edema: [Left: N] [Right: o] Calf Left: Right: Point of Measurement: 31 cm From Medial Instep 40 cm Ankle Left: Right: Point of Measurement: 8 cm From Medial Instep 24 cm Vascular Assessment Pulses: Dorsalis  Pedis Palpable: [Left:Yes] Electronic Signature(s) Signed: 12/26/2020 6:09:10 PM By: Deon Pilling Entered By: Deon Pilling on 12/26/2020 10:38:48 -------------------------------------------------------------------------------- Multi-Disciplinary Care Plan Details Patient Name: Date of Service: Cathy Gonzalez. 12/26/2020 10:00 A M Medical Record Number: [id number] Patient Account Number: [id number] Date of Birth/Sex: Treating RN: November 08, 1945 (75 y.o. [name] Primary Care Arnisha Laffoon: [name] Other Clinician: Referring Tramond Slinker: Treating Saturnino Liew/Extender: Katherine Roan in Treatment: 5 Multidisciplinary Care Plan reviewed with physician  Active Inactive Electronic Signature(s) Signed: 12/26/2020 5:47:10 PM By: Baruch Gouty RN, BSN Entered By: Baruch Gouty on 12/26/2020 10:42:53 -------------------------------------------------------------------------------- Pain Assessment Details Patient Name: Date of Service: Cathy Cota N. 12/26/2020 10:00 A M Medical Record Number: [id number] Patient Account Number: [id number] Date of Birth/Sex: Treating RN: 09/20/1946 (75 y.o. Debby Bud Primary Care Rosibel Giacobbe: [name] Other Clinician: Referring Montia Haslip: Treating Gabriela Irigoyen/Extender: [name] in Treatment: 5 Active Problems Location of Pain Severity and Description of Pain Patient Has Paino No Site Locations Rate the pain. Current Pain Level: 0 Pain Management and Medication Current Pain Management: Medication: No Cold Application: No Rest: No Massage: No Activity: No T.E.GonzalezS.: No Heat Application: No Leg drop or elevation: No Is the Current Pain Management Adequate: Adequate How does your wound impact your activities of daily livingo Sleep: No Bathing: No Appetite: No Relationship With Others: No Bladder Continence: No Emotions: No Bowel Continence: No Work: No Toileting: No Drive:  No Dressing: No Hobbies: No Electronic Signature(s) Signed: 12/26/2020 6:09:10 PM By: Deon Pilling Entered By: Deon Pilling on 12/26/2020 10:38:34 -------------------------------------------------------------------------------- Patient/Caregiver Education Details Patient Name: Date of Service: Cathy Gonzalez 3/23/2022andnbsp10:00 A M Medical Record Number: [id number] Patient Account Number: [id number] Date of Birth/Gender: Treating RN: [birth year] (67 y.o. [name] Primary Care Physician: [name] Other Clinician: Referring Physician: Treating Physician/Extender: Katherine Roan in Treatment: 5 Education Assessment Education Provided To: Patient Education Topics Provided Venous: Methods: Explain/Verbal Responses: Reinforcements needed, State content correctly Wound/Skin Impairment: Methods: Explain/Verbal Responses: Reinforcements needed, State content correctly Electronic Signature(s) Signed: 12/26/2020 5:47:10 PM By: Baruch Gouty RN, BSN Entered By: Baruch Gouty on 12/26/2020 10:43:18 -------------------------------------------------------------------------------- Wound Assessment Details Patient Name: Date of Service: Cathy Cota N. 12/26/2020 10:00 A M Medical Record Number: [id number] Patient Account Number: [id number] Date of Birth/Sex: Treating RN: [birth year] (75 y.o. Cathy Gonzalez, Meta.Reding Primary Care Fain Francis: [name] Other Clinician: Referring Taji Sather: Treating Wyllow Seigler/Extender: [name] in Treatment: 5 Wound Status Wound Number: 1 Primary Venous Leg Ulcer Etiology: Wound Location: Left, Anterior Lower Leg Wound Open Wounding Event: Gradually Appeared Status: Date Acquired: 10/07/2019 Comorbid Sleep Apnea, Hypertension, Hepatitis C, Type II Diabetes, Gout, Weeks Of Treatment: 5 History: Osteoarthritis, Neuropathy, Seizure Disorder Clustered Wound: No Photos Wound  Measurements Length: (cm) 0.1 Width: (cm) 0.1 Depth: (cm) 0.1 Area: (cm) 0.008 Volume: (cm) 0.001 % Reduction in Area: 94.9% % Reduction in Volume: 93.8% Epithelialization: Large (67-100%) Tunneling: No Undermining: No Wound Description Classification: Full Thickness Without Exposed Support Structures Wound Margin: Flat and Intact Exudate Amount: None Present Foul Odor After Cleansing: No Slough/Fibrino No Wound Bed Granulation Amount: None Present (0%) Exposed Structure Necrotic Amount: None Present (0%) Fascia Exposed: No Fat Layer (Subcutaneous Tissue) Exposed: Yes Tendon Exposed: No Muscle Exposed: No Joint Exposed: No Bone Exposed: No Treatment Notes Wound #1 (Lower Leg) Wound Laterality: Left, Anterior Cleanser Peri-Wound Care Sween Lotion (Moisturizing lotion) Discharge Instruction: Apply moisturizing lotion to leg Topical Primary Dressing ADAPTIC TOUCH 3x4.25 (in/in) Discharge Instruction: Apply to wound bed as instructed Secondary Dressing Woven Gauze Sponge, Non-Sterile 4x4 in Discharge Instruction: Apply over primary dressing as directed. Secured With Compression Wrap ThreePress (3 layer compression wrap) Discharge Instruction: Apply three layer compression as directed. Compression Stockings Carolon Multi Layer Compression Stocking Quantity: 1 Left Leg Compression Amount: 30-40 mmHg Discharge Instruction: Apply compression stocking daily as instructed. Apply first thing in the morning,  remove at night before bed. Add-Ons Electronic Signature(s) Signed: 12/26/2020 6:09:10 PM By: Deon Pilling Signed: 12/27/2020 9:47:55 AM By: Sandre Kitty Entered By: Sandre Kitty on 12/26/2020 16:01:16 -------------------------------------------------------------------------------- Vitals Details Patient Name: Date of Service: Cathy Gonzalez, EV EREE N. 12/26/2020 10:00 A M Medical Record Number: [id number] Patient Account Number: [id number] Date of  Birth/Sex: Treating RN: 1946-02-05 (75 y.o. Cathy Gonzalez, Meta.Reding Primary Care Kyllie Pettijohn: [name] Other Clinician: Referring Sham Alviar: Treating Kasten Leveque/Extender: [name] in Treatment: 5 Vital Signs Time Taken: 10:30 Temperature (F): 98.3 Height (in): 64 Pulse (bpm): 87 Weight (lbs): 225 Respiratory Rate (breaths/min): 16 Body Mass Index (BMI): 38.6 Blood Pressure (mmHg): 146/82 Capillary Blood Glucose (mg/dl): 120 Reference Range: 80 - 120 mg / dl Electronic Signature(s) Signed: 12/26/2020 6:09:10 PM By: Deon Pilling Entered By: Deon Pilling on 12/26/2020 10:38:26

## 2020-12-31 ENCOUNTER — Telehealth: Payer: Self-pay | Admitting: Cardiovascular Disease

## 2020-12-31 NOTE — Telephone Encounter (Signed)
Pt c/o medication issue:  1. Name of Medication: empagliflozin (JARDIANCE) 10 MG TABS tablet  2. How are you currently taking this medication (dosage and times per day)? As prescribed   3. Are you having a reaction (difficulty breathing--STAT)? No   4. What is your medication issue? Cathy Gonzalez is calling wanting to know if the forms she brought in on 12/26/20 for this medication have been filled out and sent in via fax. She states she only has 8 tablets left of this medication at this time. Please advise.

## 2020-12-31 NOTE — Telephone Encounter (Signed)
Duplicate. See patient advice request in Epic.

## 2021-01-02 ENCOUNTER — Encounter (HOSPITAL_BASED_OUTPATIENT_CLINIC_OR_DEPARTMENT_OTHER): Payer: HMO | Admitting: Physician Assistant

## 2021-01-02 ENCOUNTER — Other Ambulatory Visit: Payer: Self-pay

## 2021-01-02 DIAGNOSIS — I872 Venous insufficiency (chronic) (peripheral): Secondary | ICD-10-CM | POA: Diagnosis not present

## 2021-01-02 DIAGNOSIS — L97822 Non-pressure chronic ulcer of other part of left lower leg with fat layer exposed: Secondary | ICD-10-CM | POA: Diagnosis not present

## 2021-01-02 NOTE — Progress Notes (Addendum)
AAIRA, OESTREICHER ([id number]) Visit Report for 01/02/2021 Chief Complaint Document Details Patient Name: Date of Service: Cathy Gonzalez 01/02/2021 10:30 A M Medical Record Number: [id number] Patient Account Number: [id number] Date of Birth/Sex: Treating RN: August 31, 1946 (75 y.o. [name] Primary Care Provider: [name] Other Clinician: Referring Provider: Treating Provider/Extender: [name] in Treatment: 6 Information Obtained from: Patient Chief Complaint Left LE Ulcer Electronic Signature(s) Signed: 01/02/2021 10:45:24 AM By: Worthy Keeler PA-C Entered By: Worthy Keeler on 01/02/2021 10:45:23 -------------------------------------------------------------------------------- HPI Details Patient Name: Date of Service: Cathy Gonzalez 01/02/2021 10:30 A M Medical Record Number: [id number] Patient Account Number: [id number] Date of Birth/Sex: Treating RN: December 06, 1945 (62 y.o. [name] Primary Care Provider: [name] Other Clinician: Referring Provider: Treating Provider/Extender: [name] in Treatment: 6 History of Present Illness HPI Description: 11/21/2020 upon evaluation today patient appears to be doing well with regard to her wound all things considered. With that being said she has been tolerating the dressing changes without complication. Fortunately there is no signs of active infection at this time which is great news. No fevers, chills, nausea, vomiting, or diarrhea. In general I am extremely happy with the fact that the patient seems to be making such good progress here. She tells me it has come along way from where it started. Nonetheless it has been about a month and still not closed. She does have chronic venous stasis, diabetes mellitus type 2, her most recent hemoglobin A1c was 6.5 although I am not exactly sure of the date on that. That is just per the records reviewed. She does  have hypertension and chronic viral hepatitis C as well. She also has neuropathy in her feet. This apparently is secondary to the diabetes. There does not appear to be any signs of active infection right now which is great news she has been on 2 rounds of Keflex as prescribed by the primary care provider I see no signs of infection at this point. The patient did have use of Voltaren gel as well as Thera works that she had utilized prior to this starting and she feels like that may have caused the issue. 11/28/2020 upon evaluation today patient appears to be doing well in regard to her leg ulcer. I am actually seeing signs of good improvement here which is excellent news. There does not appear to be any evidence of active infection which is great news. I think the collagen is doing a good job. 12/12/2020 upon evaluation today patient appears to be doing well with regard to her wound on the leg. This is showing signs of good improvement which is excellent news and there does not appear to be any evidence of active infection at this point. No fevers, chills, nausea, vomiting, or diarrhea. 12/19/2020 upon evaluation today patient appears to be doing well with regard to her leg ulcer I see no signs of infection at this point which is great news. In general I am extremely pleased with where we stand today. No fevers, chills, nausea, vomiting, or diarrhea. 12/26/2020 on evaluation today patient appears to be doing excellent in regard to her wound. Unfortunately we were hoping this will be healed today and does not appear to be the case. There does not appear to be any signs of active infection systemically at this time which is great news and overall very pleased in that regard. No fevers, chills,  nausea, vomiting, or diarrhea. 01/02/2021 upon evaluation today patient appears to be doing well with regard to her wounds. She has been tolerating the dressing changes without complication. Fortunately there does not  appear to be any evidence of active infection which is great news and overall very pleased with where things stand today. No fevers, chills, nausea, vomiting, or diarrhea. Electronic Signature(s) Signed: 01/02/2021 11:37:20 AM By: Worthy Keeler PA-C Entered By: Worthy Keeler on 01/02/2021 11:37:20 -------------------------------------------------------------------------------- Physical Exam Details Patient Name: Date of Service: Cathy Gonzalez 01/02/2021 10:30 A M Medical Record Number: [id number] Patient Account Number: [id number] Date of Birth/Sex: Treating RN: 07/06/1946 (68 y.o. [name] Primary Care Provider: [name] Other Clinician: Referring Provider: Treating Provider/Extender: [name] in Treatment: 6 Constitutional Obese and well-hydrated in no acute distress. Respiratory normal breathing without difficulty. Psychiatric this patient is able to make decisions and demonstrates good insight into disease process. Alert and Oriented x 3. pleasant and cooperative. Notes Upon inspection patient's wound bed actually showed signs of complete epithelization. There does not appear to be any signs of infection which is great news and overall very pleased with where things stand today. Electronic Signature(s) Signed: 01/02/2021 11:38:09 AM By: Worthy Keeler PA-C Entered By: Worthy Keeler on 01/02/2021 11:38:09 -------------------------------------------------------------------------------- Physician Orders Details Patient Name: Date of Service: Cathy Gonzalez. 01/02/2021 10:30 A M Medical Record Number: [id number] Patient Account Number: [id number] Date of Birth/Sex: Treating RN: [birth year] (7 y.o. [name] Primary Care Provider: [name] Other Clinician: Referring Provider: Treating Provider/Extender: [name] in Treatment: 6 Verbal / Phone Orders: No Diagnosis Coding ICD-10  Coding Code Description I87.2 Venous insufficiency (chronic) (peripheral) L97.822 Non-pressure chronic ulcer of other part of left lower leg with fat layer exposed E11.622 Type 2 diabetes mellitus with other skin ulcer E11.40 Type 2 diabetes mellitus with diabetic neuropathy, unspecified I10 Essential (primary) hypertension B18.2 Chronic viral hepatitis C Discharge From [facility] Discharge from [place] Bathing/ Shower/ Hygiene May shower and wash wound with soap and water. Edema Control - Lymphedema / SCD / Other Bilateral Lower Extremities Elevate legs to the level of the heart or above for 30 minutes daily and/or when sitting, a frequency of: - throughout the day Avoid standing for long periods of time. Exercise regularly Moisturize legs daily. - after shower Compression stocking or Garment 20-30 mm/Hg pressure to: - both legs daily. Apply first thing in the morning and remove at night Electronic Signature(s) Signed: 01/02/2021 5:20:30 PM By: Worthy Keeler PA-C Signed: 01/02/2021 5:50:20 PM By: Baruch Gouty RN, BSN Entered By: Baruch Gouty on 01/02/2021 11:41:53 -------------------------------------------------------------------------------- Problem List Details Patient Name: Date of Service: Cathy Gonzalez. 01/02/2021 10:30 A M Medical Record Number: [id number] Patient Account Number: [id number] Date of Birth/Sex: Treating RN: 1945/12/05 (24 y.o. [name] Primary Care Provider: [name] Other Clinician: Referring Provider: Treating Provider/Extender: [name] in Treatment: 6 Active Problems ICD-10 Encounter Code Description Active Date MDM Diagnosis I87.2 Venous insufficiency (chronic) (peripheral) 11/21/2020 No Yes L97.822 Non-pressure chronic ulcer of other part of left lower leg with fat layer exposed2/16/2022 No Yes E11.622 Type 2 diabetes mellitus with other skin ulcer 11/21/2020 No Yes E11.40 Type 2  diabetes mellitus with diabetic neuropathy, unspecified 11/21/2020 No Yes I10 Essential (primary) hypertension 11/21/2020 No Yes B18.2 Chronic viral hepatitis C 11/21/2020 No  Yes Inactive Problems Resolved Problems Electronic Signature(s) Signed: 01/02/2021 10:45:18 AM By: Worthy Keeler PA-C Entered By: Worthy Keeler on 01/02/2021 10:45:18 -------------------------------------------------------------------------------- Progress Note Details Patient Name: Date of Service: Cathy Gonzalez. 01/02/2021 10:30 A M Medical Record Number: [id number] Patient Account Number: [id number] Date of Birth/Sex: Treating RN: [birth year] (77 y.o. [name] Primary Care Provider: [name] Other Clinician: Referring Provider: Treating Provider/Extender: [name] in Treatment: 6 Subjective Chief Complaint Information obtained from Patient Left LE Ulcer History of Present Illness (HPI) 11/21/2020 upon evaluation today patient appears to be doing well with regard to her wound all things considered. With that being said she has been tolerating the dressing changes without complication. Fortunately there is no signs of active infection at this time which is great news. No fevers, chills, nausea, vomiting, or diarrhea. In general I am extremely happy with the fact that the patient seems to be making such good progress here. She tells me it has come along way from where it started. Nonetheless it has been about a month and still not closed. She does have chronic venous stasis, diabetes mellitus type 2, her most recent hemoglobin A1c was 6.5 although I am not exactly sure of the date on that. That is just per the records reviewed. She does have hypertension and chronic viral hepatitis C as well. She also has neuropathy in her feet. This apparently is secondary to the diabetes. There does not appear to be any signs of active infection right now which is great news she has been  on 2 rounds of Keflex as prescribed by the primary care provider I see no signs of infection at this point. The patient did have use of Voltaren gel as well as Thera works that she had utilized prior to this starting and she feels like that may have caused the issue. 11/28/2020 upon evaluation today patient appears to be doing well in regard to her leg ulcer. I am actually seeing signs of good improvement here which is excellent news. There does not appear to be any evidence of active infection which is great news. I think the collagen is doing a good job. 12/12/2020 upon evaluation today patient appears to be doing well with regard to her wound on the leg. This is showing signs of good improvement which is excellent news and there does not appear to be any evidence of active infection at this point. No fevers, chills, nausea, vomiting, or diarrhea. 12/19/2020 upon evaluation today patient appears to be doing well with regard to her leg ulcer I see no signs of infection at this point which is great news. In general I am extremely pleased with where we stand today. No fevers, chills, nausea, vomiting, or diarrhea. 12/26/2020 on evaluation today patient appears to be doing excellent in regard to her wound. Unfortunately we were hoping this will be healed today and does not appear to be the case. There does not appear to be any signs of active infection systemically at this time which is great news and overall very pleased in that regard. No fevers, chills, nausea, vomiting, or diarrhea. 01/02/2021 upon evaluation today patient appears to be doing well with regard to her wounds. She has been tolerating the dressing changes without complication. Fortunately there does not appear to be any evidence of active infection which is great news and overall very pleased with where things stand today. No fevers, chills, nausea, vomiting, or diarrhea. Objective  Constitutional Obese and well-hydrated in no acute  distress. Vitals Time Taken: 10:55 AM, Height: 64 in, Weight: 225 lbs, BMI: 38.6, Temperature: 98.1 F, Pulse: 84 bpm, Respiratory Rate: 16 breaths/min, Blood Pressure: 136/83 mmHg, Capillary Blood Glucose: 119 mg/dl. General Notes: glucose per pt report Respiratory normal breathing without difficulty. Psychiatric this patient is able to make decisions and demonstrates good insight into disease process. Alert and Oriented x 3. pleasant and cooperative. General Notes: Upon inspection patient's wound bed actually showed signs of complete epithelization. There does not appear to be any signs of infection which is great news and overall very pleased with where things stand today. Integumentary (Hair, Skin) Wound #1 status is Healed - Epithelialized. Original cause of wound was Gradually Appeared. The date acquired was: 10/07/2019. The wound has been in treatment 6 weeks. The wound is located on the Left,Anterior Lower Leg. The wound measures 0cm length x 0cm width x 0cm depth; 0cm^2 area and 0cm^3 volume. There is no tunneling or undermining noted. There is a none present amount of drainage noted. The wound margin is flat and intact. There is no granulation within the wound bed. There is no necrotic tissue within the wound bed. Assessment Active Problems ICD-10 Venous insufficiency (chronic) (peripheral) Non-pressure chronic ulcer of other part of left lower leg with fat layer exposed Type 2 diabetes mellitus with other skin ulcer Type 2 diabetes mellitus with diabetic neuropathy, unspecified Essential (primary) hypertension Chronic viral hepatitis C Plan Discharge From [facility]: Discharge from [place] Bathing/ Shower/ Hygiene: May shower and wash wound with soap and water. Edema Control - Lymphedema / SCD / Other: Elevate legs to the level of the heart or above for 30 minutes daily and/or when sitting, a frequency of: - throughout the day Avoid standing for long periods of  time. Exercise regularly Moisturize legs daily. - after shower Compression stocking or Garment 20-30 mm/Hg pressure to: - both legs daily. Apply first thing in the morning and remove at night 1. Would recommend that we going to discontinue wound care services at this point and the patient is in agreement with the plan. 2. I am also can recommend the patient continue to monitor for any signs of worsening infection or reopening if anything occurs she should let me know soon as possible. 3. I am also can recommend the patient continue to wear her compression we do have that with her today to put on and I think we will show her how to do that and then subsequently she should keep this on from first thing in the morning to in the evening when she goes to bed. We will see her back for follow-up visit as needed. Electronic Signature(s) Signed: 01/02/2021 5:20:30 PM By: Worthy Keeler PA-C Signed: 01/02/2021 5:50:20 PM By: Baruch Gouty RN, BSN Previous Signature: 01/02/2021 11:38:44 AM Version By: Worthy Keeler PA-C Entered By: Baruch Gouty on 01/02/2021 11:42:05 -------------------------------------------------------------------------------- SuperBill Details Patient Name: Date of Service: Cathy Gonzalez 01/02/2021 Medical Record Number: [id number] Patient Account Number: [id number] Date of Birth/Sex: Treating RN: [birth year] (44 y.o. [name] Primary Care Provider: [name] Other Clinician: Referring Provider: Treating Provider/Extender: [name] in Treatment: 6 Diagnosis Coding ICD-10 Codes Code Description I87.2 Venous insufficiency (chronic) (peripheral) L97.822 Non-pressure chronic ulcer of other part of left lower leg with fat layer exposed E11.622 Type 2 diabetes mellitus with other skin ulcer E11.40 Type 2 diabetes mellitus with diabetic neuropathy, unspecified  I10 Essential (primary) hypertension B18.2 Chronic viral hepatitis  C Facility Procedures CPT4 Code: [id number] Description: 00938 - WOUND CARE VISIT-LEV 3 EST PT Modifier: Quantity: 1 Physician Procedures : CPT4 Code Description Modifier [id number] - WC PHYS LEVEL 3 - EST PT ICD-10 Diagnosis Description I87.2 Venous insufficiency (chronic) (peripheral) L97.822 Non-pressure chronic ulcer of other part of left lower leg with fat layer exposed E11.622  Type 2 diabetes mellitus with other skin ulcer E11.40 Type 2 diabetes mellitus with diabetic neuropathy, unspecified Quantity: 1 Electronic Signature(s) Signed: 01/02/2021 11:38:55 AM By: Worthy Keeler PA-C Entered By: Worthy Keeler on 01/02/2021 11:38:55

## 2021-01-03 NOTE — Progress Notes (Signed)
Cathy Gonzalez, Cathy Gonzalez ([id number]) Visit Report for 01/02/2021 Arrival Information Details Patient Name: Date of Service: Cathy Gonzalez 01/02/2021 10:30 A M Medical Record Number: [id number] Patient Account Number: [id number] Date of Birth/Sex: Treating RN: 07-27-1946 (75 y.o. Cathy Gonzalez Primary Care Saragrace Selke: [name] Other Clinician: Referring Takiah Maiden: Treating Analena Gama/Extender: Katherine Roan in Treatment: 6 Visit Information History Since Last Visit Added or deleted any medications: No Patient Arrived: Ambulatory Any new allergies or adverse reactions: No Arrival Time: 10:55 Had a fall or experienced change in No Accompanied By: alone activities of daily living that may affect Transfer Assistance: None risk of falls: Patient Identification Verified: Yes Signs or symptoms of abuse/neglect since last visito No Secondary Verification Process Completed: Yes Hospitalized since last visit: No Patient Requires Transmission-Based Precautions: No Implantable device outside of the clinic excluding No Patient Has Alerts: No cellular tissue based products placed in the center since last visit: Has Dressing in Place as Prescribed: Yes Has Compression in Place as Prescribed: Yes Pain Present Now: No Electronic Signature(s) Signed: 01/03/2021 5:46:28 PM By: Levan Hurst RN, BSN Entered By: Levan Hurst on 01/02/2021 10:55:21 -------------------------------------------------------------------------------- Clinic Level of Care Assessment Details Patient Name: Date of Service: Cathy Gonzalez 01/02/2021 10:30 A M Medical Record Number: [id number] Patient Account Number: [id number] Date of Birth/Sex: Treating RN: Jul 17, 1946 (75 y.o. Cathy Gonzalez Primary Care Palmyra Rogacki: [name] Other Clinician: Referring Ambrose Wile: Treating Sherrika Weakland/Extender: Katherine Roan in Treatment: 6 Clinic Level of Care Assessment Items TOOL 4  Quantity Score []  - 0 Use when only an EandM is performed on FOLLOW-UP visit ASSESSMENTS - Nursing Assessment / Reassessment X- 1 10 Reassessment of Co-morbidities (includes updates in patient status) X- 1 5 Reassessment of Adherence to Treatment Plan ASSESSMENTS - Wound and Skin A ssessment / Reassessment X - Simple Wound Assessment / Reassessment - one wound 1 5 []  - 0 Complex Wound Assessment / Reassessment - multiple wounds []  - 0 Dermatologic / Skin Assessment (not related to wound area) ASSESSMENTS - Focused Assessment X- 1 5 Circumferential Edema Measurements - multi extremities []  - 0 Nutritional Assessment / Counseling / Intervention X- 1 5 Lower Extremity Assessment (monofilament, tuning fork, pulses) []  - 0 Peripheral Arterial Disease Assessment (using hand held doppler) ASSESSMENTS - Ostomy and/or Continence Assessment and Care []  - 0 Incontinence Assessment and Management []  - 0 Ostomy Care Assessment and Management (repouching, etc.) PROCESS - Coordination of Care X - Simple Patient / Family Education for ongoing care 1 15 []  - 0 Complex (extensive) Patient / Family Education for ongoing care X- 1 10 Staff obtains Programmer, systems, Records, T Results / Process Orders est []  - 0 Staff telephones HHA, Nursing Homes / Clarify orders / etc []  - 0 Routine Transfer to another Facility (non-emergent condition) []  - 0 Routine Hospital Admission (non-emergent condition) []  - 0 New Admissions / Biomedical engineer / Ordering NPWT Apligraf, etc. , []  - 0 Emergency Hospital Admission (emergent condition) X- 1 10 Simple Discharge Coordination []  - 0 Complex (extensive) Discharge Coordination PROCESS - Special Needs []  - 0 Pediatric / Minor Patient Management []  - 0 Isolation Patient Management []  - 0 Hearing / Language / Visual special needs []  - 0 Assessment of Community assistance (transportation, D/C planning, etc.) []  - 0 Additional assistance / Altered  mentation []  - 0 Support Surface(s) Assessment (bed, cushion, seat, etc.) INTERVENTIONS - Wound Cleansing / Measurement X -  Simple Wound Cleansing - one wound 1 5 []  - 0 Complex Wound Cleansing - multiple wounds X- 1 5 Wound Imaging (photographs - any number of wounds) []  - 0 Wound Tracing (instead of photographs) []  - 0 Simple Wound Measurement - one wound []  - 0 Complex Wound Measurement - multiple wounds INTERVENTIONS - Wound Dressings []  - 0 Small Wound Dressing one or multiple wounds []  - 0 Medium Wound Dressing one or multiple wounds []  - 0 Large Wound Dressing one or multiple wounds []  - 0 Application of Medications - topical []  - 0 Application of Medications - injection INTERVENTIONS - Miscellaneous []  - 0 External ear exam []  - 0 Specimen Collection (cultures, biopsies, blood, body fluids, etc.) []  - 0 Specimen(s) / Culture(s) sent or taken to Lab for analysis []  - 0 Patient Transfer (multiple staff / Civil Service fast streamer / Similar devices) []  - 0 Simple Staple / Suture removal (25 or less) []  - 0 Complex Staple / Suture removal (26 or more) []  - 0 Hypo / Hyperglycemic Management (close monitor of Blood Glucose) []  - 0 Ankle / Brachial Index (ABI) - do not check if billed separately X- 1 5 Vital Signs Has the patient been seen at the hospital within the last three years: Yes Total Score: 80 Level Of Care: New/Established - Level 3 Electronic Signature(s) Signed: 01/02/2021 5:50:20 PM By: Baruch Gouty RN, BSN Entered By: Baruch Gouty on 01/02/2021 11:35:54 -------------------------------------------------------------------------------- Encounter Discharge Information Details Patient Name: Date of Service: Cathy Gonzalez. 01/02/2021 10:30 A M Medical Record Number: [id number] Patient Account Number: [id number] Date of Birth/Sex: Treating RN: 09-24-1946 (75 y.o. Cathy Gonzalez Primary Care Ovie Eastep: [name] Other Clinician: Referring  Mava Suares: Treating Kawanda Drumheller/Extender: Katherine Roan in Treatment: 6 Encounter Discharge Information Items Discharge Condition: Stable Ambulatory Status: Ambulatory Discharge Destination: Home Transportation: Private Auto Accompanied By: self Schedule Follow-up Appointment: Yes Clinical Summary of Care: Patient Declined Electronic Signature(s) Signed: 01/02/2021 5:50:20 PM By: Baruch Gouty RN, BSN Entered By: Baruch Gouty on 01/02/2021 11:43:54 -------------------------------------------------------------------------------- Lower Extremity Assessment Details Patient Name: Date of Service: Cathy Gonzalez. 01/02/2021 10:30 A M Medical Record Number: [id number] Patient Account Number: [id number] Date of Birth/Sex: Treating RN: 12/07/1945 (75 y.o. Cathy Gonzalez Primary Care Ladislaus Repsher: [name] Other Clinician: Referring Stclair Szymborski: Treating Daylani Deblois/Extender: Cathy Gonzalez in Treatment: 6 Edema Assessment Assessed: [Left: No] [Right: No] Edema: [Left: N] [Right: o] Calf Left: Right: Point of Measurement: 31 cm From Medial Instep 39.5 cm Ankle Left: Right: Point of Measurement: 8 cm From Medial Instep 24 cm Knee To Floor Left: Right: From Medial Instep 40 cm Vascular Assessment Pulses: Dorsalis Pedis Palpable: [Left:Yes] Electronic Signature(s) Signed: 01/03/2021 5:46:28 PM By: Levan Hurst RN, BSN Entered By: Levan Hurst on 01/02/2021 11:03:22 -------------------------------------------------------------------------------- Pain Assessment Details Patient Name: Date of Service: Cathy Cota N. 01/02/2021 10:30 A M Medical Record Number: [id number] Patient Account Number: [id number] Date of Birth/Sex: Treating RN: May 25, 1946 (75 y.o. Cathy Gonzalez Primary Care Alexia Dinger: [name] Other Clinician: Referring Janequa Kipnis: Treating Galen Russman/Extender: Cathy Gonzalez in Treatment:  6 Active Problems Location of Pain Severity and Description of Pain Patient Has Paino No Site Locations Pain Management and Medication Current Pain Management: Electronic Signature(s) Signed: 01/03/2021 5:46:28 PM By: Levan Hurst RN, BSN Entered By: Levan Hurst on 01/02/2021 10:55:29 -------------------------------------------------------------------------------- Patient/Caregiver Education Details Patient Name: Date of Service: Cathy Gonzalez, Cathy Gonzalez 3/30/2022andnbsp10:30 A M Medical Record Number: [id number] Patient Account Number: [id number] Date of Birth/Gender: Treating RN: 05/15/1946 (48 y.o. Cathy Gonzalez Primary Care Physician: [name] Other Clinician: Referring Physician: Treating Physician/Extender: Katherine Roan in Treatment: 6 Education Assessment Education Provided To: Patient Education Topics Provided Venous: Methods: Explain/Verbal Responses: Reinforcements needed, State content correctly Wound/Skin Impairment: Methods: Explain/Verbal Responses: Reinforcements needed, State content correctly Electronic Signature(s) Signed: 01/02/2021 5:50:20 PM By: Baruch Gouty RN, BSN Entered By: Baruch Gouty on 01/02/2021 11:35:10 -------------------------------------------------------------------------------- Wound Assessment Details Patient Name: Date of Service: Cathy Gonzalez. 01/02/2021 10:30 A M Medical Record Number: [id number] Patient Account Number: [id number] Date of Birth/Sex: Treating RN: [birth year] (75 y.o. Cathy Gonzalez Primary Care Kinzie Wickes: [name] Other Clinician: Referring Laraine Samet: Treating Egon Dittus/Extender: Cathy Gonzalez in Treatment: 6 Wound Status Wound Number: 1 Primary Venous Leg Ulcer Etiology: Wound Location: Left, Anterior Lower Leg Wound Healed - Epithelialized Wounding Event: Gradually Appeared Status: Date Acquired: 10/07/2019 Comorbid Sleep Apnea,  Hypertension, Hepatitis C, Type II Diabetes, Gout, Gonzalez Of Treatment: 6 History: Osteoarthritis, Neuropathy, Seizure Disorder Clustered Wound: No Wound Measurements Length: (cm) Width: (cm) Depth: (cm) Area: (cm) Volume: (cm) 0 % Reduction in Area: 100% 0 % Reduction in Volume: 100% 0 Epithelialization: Large (67-100%) 0 Tunneling: No 0 Undermining: No Wound Description Classification: Full Thickness Without Exposed Support Structures Wound Margin: Flat and Intact Exudate Amount: None Present Foul Odor After Cleansing: No Slough/Fibrino No Wound Bed Granulation Amount: None Present (0%) Exposed Structure Necrotic Amount: None Present (0%) Fascia Exposed: No Fat Layer (Subcutaneous Tissue) Exposed: No Tendon Exposed: No Muscle Exposed: No Joint Exposed: No Bone Exposed: No Electronic Signature(s) Signed: 01/03/2021 5:46:28 PM By: Levan Hurst RN, BSN Entered By: Levan Hurst on 01/02/2021 11:03:39 -------------------------------------------------------------------------------- [place] Details Patient Name: Date of Service: Cathy Cota N. 01/02/2021 10:30 A M Medical Record Number: [id number] Patient Account Number: [id number] Date of Birth/Sex: Treating RN: [birth year] (75 y.o. Cathy Gonzalez Primary Care Jayln Branscom: [name] Other Clinician: Referring Mikyla Schachter: Treating Demarqus Jocson/Extender: Cathy Gonzalez in Treatment: 6 Vital Signs Time Taken: 10:55 Temperature (F): 98.1 Height (in): 64 Pulse (bpm): 84 Weight (lbs): 225 Respiratory Rate (breaths/min): 16 Body Mass Index (BMI): 38.6 Blood Pressure (mmHg): 136/83 Capillary Blood Glucose (mg/dl): 119 Reference Range: 80 - 120 mg / dl Notes glucose per pt report Electronic Signature(s) Signed: 01/03/2021 5:46:28 PM By: Levan Hurst RN, BSN Entered By: Levan Hurst on 01/02/2021 11:02:29

## 2021-01-07 ENCOUNTER — Encounter: Payer: Self-pay | Admitting: Neurology

## 2021-01-07 ENCOUNTER — Other Ambulatory Visit: Payer: Self-pay

## 2021-01-07 ENCOUNTER — Ambulatory Visit: Payer: HMO | Admitting: Neurology

## 2021-01-07 VITALS — BP 155/90 | HR 88 | Ht 64.0 in | Wt 234.6 lb

## 2021-01-07 DIAGNOSIS — E1169 Type 2 diabetes mellitus with other specified complication: Secondary | ICD-10-CM | POA: Diagnosis not present

## 2021-01-07 DIAGNOSIS — E1165 Type 2 diabetes mellitus with hyperglycemia: Secondary | ICD-10-CM | POA: Diagnosis not present

## 2021-01-07 DIAGNOSIS — R569 Unspecified convulsions: Secondary | ICD-10-CM

## 2021-01-07 DIAGNOSIS — I10 Essential (primary) hypertension: Secondary | ICD-10-CM | POA: Diagnosis not present

## 2021-01-07 DIAGNOSIS — E785 Hyperlipidemia, unspecified: Secondary | ICD-10-CM | POA: Diagnosis not present

## 2021-01-07 DIAGNOSIS — N3281 Overactive bladder: Secondary | ICD-10-CM | POA: Diagnosis not present

## 2021-01-07 MED ORDER — LEVETIRACETAM 750 MG PO TABS: 1500.0000 mg | ORAL_TABLET | Freq: Two times a day (BID) | ORAL | 4 refills | Status: DC

## 2021-01-07 NOTE — Patient Instructions (Signed)
Continue current medications  Call for seizures Make sure using CPAP nightly > 4 hours  Keep appointment in October for CPAP

## 2021-01-07 NOTE — Progress Notes (Signed)
PATIENT: Cathy Gonzalez DOB: 04-Aug-1946  REASON FOR VISIT: follow up HISTORY FROM: patient  HISTORY OF PRESENT ILLNESS: Today 01/07/21  Cathy Gonzalez is a 75 year old female with history of obesity, diabetes, diabetic peripheral neuropathy, and nocturnal seizures.  Cathy Gonzalez is on CPAP. Cathy Gonzalez is on Keppra for seizures, also Lyrica for neuropathy filled by PCP.  Cathy Gonzalez is doing overall well, may have had a small seizure 1-2 years ago, but isn't sure on timeframe.  Seizures are always nocturnal, will wake up the next morning bit her tongue, feel drowsy the next day.  Are often brought on by stress.  Cathy Gonzalez lives alone, drives a car.  Has been prescribed lorazepam to take as needed for panic attacks.  Cathy Gonzalez has a lot of stress with her grown children.  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  Claims mostly compliant with CPAP, only misses if Cathy Gonzalez doesn't have masks.  Was dealing with a poorly healing wound to her left lower leg, is now resolved.  Last A1c was around 6.5.  Here today for evaluation unaccompanied.  Update 01/02/2020 SS: Cathy Gonzalez is a 75 year old female with history of obesity, diabetes, diabetic peripheral neuropathy, and nocturnal seizures.  Cathy Gonzalez is on CPAP.  Cathy Gonzalez has had 1 brief nocturnal seizure in December 2019, associated with significant stress.  Cathy Gonzalez remains on Keppra and Lyrica.  Cathy Gonzalez has not had recurrent seizure.  Her neuropathy is doing better.  Her diabetes is under good control, recent A1c was 5.6.  Cathy Gonzalez has been able to lose 30 pounds.  Cathy Gonzalez says her gait and balance is more stable.  Cathy Gonzalez has not had any falls.  Cathy Gonzalez continues to use CPAP, but is having issues with the tubing leaking, despite getting new tubing.  Cathy Gonzalez presents today for evaluation accompanied by her great-grandson.  Cathy Gonzalez denies any new problems or concerns.  HISTORY 12/13/2018 Dr. Jannifer Gonzalez: Cathy Gonzalez is a 75 year old right-handed black female with a history of obesity, diabetes, diabetic peripheral neuropathy, and nocturnal  seizures.  The patient also indicates that Cathy Gonzalez has sleep apnea, Cathy Gonzalez has a CPAP machine but Cathy Gonzalez does not tolerate the mask.  The patient indicates that when Cathy Gonzalez tries to use a humidifier with the mask Cathy Gonzalez has problems with the water getting in her throat, when Cathy Gonzalez does not use a humidifier, her mouth dries out.  Cathy Gonzalez wishes to have reevaluation of this issue.  The patient has had 1 brief nocturnal seizure in December 2019 associated with the time of stress when her sister and her sister's son died.  The patient has otherwise gone well over a year without any seizures, Cathy Gonzalez is on Keppra taking 1500 mg twice daily.  The patient tolerates the drug fairly well.  Cathy Gonzalez is also on Lyrica taking 300 mg twice daily.  Cathy Gonzalez does have some mild gait instability, Cathy Gonzalez has not had any falls.   REVIEW OF SYSTEMS: Out of a complete 14 system review of symptoms, the patient complains only of the following symptoms, and all other reviewed systems are negative.  Sleep apnea, seizure  ALLERGIES: No Known Allergies  HOME MEDICATIONS: Outpatient Medications Prior to Visit  Medication Sig Dispense Refill  . acetaminophen (TYLENOL) 500 MG tablet Take 500 mg by mouth every 6 (six) hours as needed for moderate pain.    [name] amLODipine (NORVASC) 10 MG tablet Take 10 mg by mouth daily. Takes half    . Blood Glucose Monitoring Suppl (ONE TOUCH ULTRA 2) w/Device  KIT 2 (two) times daily. as directed    . colchicine 0.6 MG tablet Take 0.6 mg by mouth as needed (gout).     [name] diclofenac Sodium (VOLTAREN) 1 % GEL SMARTSIG:2 Inch(es) Topical 4 Times Daily    . empagliflozin (JARDIANCE) 10 MG TABS tablet Take 1 tablet (10 mg total) by mouth daily before breakfast. 90 tablet 3  . ezetimibe (ZETIA) 10 MG tablet Take 10 mg by mouth at bedtime.     . furosemide (LASIX) 40 MG tablet Take 40 mg by mouth daily.     [name] levETIRAcetam (KEPPRA) 750 MG tablet Take 2 tablets by mouth twice daily 360 tablet 3  . LORazepam (ATIVAN) 0.5 MG tablet Take 0.5  mg by mouth 2 (two) times daily.    . metFORMIN (GLUCOPHAGE) 500 MG tablet Take 500 mg by mouth at bedtime.     . montelukast (SINGULAIR) 10 MG tablet Take 10 mg by mouth at bedtime.    . ONE TOUCH ULTRA TEST test strip   5  . ONETOUCH DELICA LANCETS 09O MISC   3  . Potassium Chloride ER 20 MEQ TBCR Take 1 tablet by mouth 2 (two) times daily.  6  . pregabalin (LYRICA) 300 MG capsule Take 1 capsule (300 mg total) by mouth 2 (two) times daily. 60 capsule 0  . sucralfate (CARAFATE) 1 G tablet Take 1 g by mouth 2 (two) times daily.     [name] telmisartan (MICARDIS) 80 MG tablet Take 80 mg by mouth daily.   1  . FIASP FLEXTOUCH 100 UNIT/ML SOPN Inject 4-15 Units into the skin 3 (three) times daily with meals.   2   No facility-administered medications prior to visit.    PAST MEDICAL HISTORY: Past Medical History:  Diagnosis Date  . Arthritis   . Cataract    left cataract removed in 2017  . Diabetes mellitus   . Diabetic neuropathy ([place])   . Diabetic neuropathy, type II diabetes mellitus ([place])   . Hepatitis C    took tx for   . Hypertension   . Hypokalemia   . Knee pain    right   . Morbidly obese ([place])   . Nocturnal seizures ([place]) 05/14/2015   no bad seizure since 1980's  . OSA on CPAP    uses cpap 2-3 week  . Plantar fasciitis    both feet    PAST SURGICAL HISTORY: Past Surgical History:  Procedure Laterality Date  . ABDOMINAL HYSTERECTOMY     partial  . CATARACT EXTRACTION Left   . COLONOSCOPY WITH PROPOFOL N/A 08/21/2015   Procedure: COLONOSCOPY WITH PROPOFOL;  Surgeon: Carol Ada, MD;  Location: [facility];  Service: Endoscopy;  Laterality: N/A;  . ESOPHAGOGASTRODUODENOSCOPY (EGD) WITH PROPOFOL N/A 08/21/2015   Procedure: ESOPHAGOGASTRODUODENOSCOPY (EGD) WITH PROPOFOL;  Surgeon: Carol Ada, MD;  Location: [facility];  Service: Endoscopy;  Laterality: N/A;  . ESOPHAGOGASTRODUODENOSCOPY (EGD) WITH PROPOFOL N/A 10/08/2018   Procedure: ESOPHAGOGASTRODUODENOSCOPY (EGD)  WITH PROPOFOL;  Surgeon: Carol Ada, MD;  Location: [facility];  Service: Endoscopy;  Laterality: N/A;  . EYE SURGERY Bilateral    cataracts    FAMILY HISTORY: Family History  Problem Relation Age of Onset  . Diabetes Mother   . Heart attack Mother   . Diabetes Sister   . Diabetes Brother   . Cancer Sister        breast  . Breast cancer Sister   . Seizures Neg Hx     SOCIAL HISTORY: Social History  Socioeconomic History  . Marital status: Single    Spouse name: Not on file  . Number of children: 4  . Years of education: 74  . Highest education level: Not on file  Occupational History  . Occupation: [employer]  Tobacco Use  . Smoking status: Never Smoker  . Smokeless tobacco: Never Used  Vaping Use  . Vaping Use: Never used  Substance and Sexual Activity  . Alcohol use: No    Alcohol/week: 0.0 standard drinks  . Drug use: No  . Sexual activity: Never  Other Topics Concern  . Not on file  Social History Narrative   Patient occasionally drinks caffeine.   Patient is right handed.   Social Determinants of Health   Financial Resource Strain: Not on file  Food Insecurity: Food Insecurity Present  . Worried About Charity fundraiser in the Last Year: Sometimes true  . Ran Out of Food in the Last Year: Never true  Transportation Needs: No Transportation Needs  . Lack of Transportation (Medical): No  . Lack of Transportation (Non-Medical): No  Physical Activity: Not on file  Stress: Not on file  Social Connections: Not on file  Intimate Partner Violence: Not on file   PHYSICAL EXAM  Vitals:   01/07/21 0723  BP: (!) 155/90  Pulse: 88  Weight: 234 lb 9.6 oz (106.4 kg)  Height: '5\' 4"'  (1.626 m)   Body mass index is 40.27 kg/m.  Generalized: Well developed, in no acute distress   Neurological examination  Mentation: Alert oriented to time, place, history taking. Follows all commands speech and language fluent Cranial nerve II-XII: Pupils were equal  round reactive to light. Extraocular movements were full, visual field were full on confrontational test. Facial sensation and strength were normal. Head turning and shoulder shrug were normal and symmetric. Motor: The motor testing reveals 5 over 5 strength of all 4 extremities. Good symmetric motor tone is noted throughout.  Sensory: Sensory testing is intact to soft touch on all 4 extremities. No evidence of extinction is noted.  Coordination: Cerebellar testing reveals good finger-nose-finger and heel-to-shin bilaterally.  Gait and station: Gait is steady, slight limp on the left Reflexes: Deep tendon reflexes are symmetric but decreased.  DIAGNOSTIC DATA (LABS, IMAGING, TESTING) - I reviewed patient records, labs, notes, testing and imaging myself where available.  Lab Results  Component Value Date   WBC 7.3 02/16/2017   HGB 13.9 02/16/2017   HCT 41.7 02/16/2017   MCV 88.7 02/16/2017   PLT 230 02/16/2017      Component Value Date/Time   NA 137 02/16/2017 1325   NA 140 08/11/2016 0833   K 3.4 (L) 02/16/2017 1325   CL 98 (L) 02/16/2017 1325   CO2 26 02/16/2017 1325   GLUCOSE 230 (H) 02/16/2017 1325   BUN 24 (H) 02/16/2017 1325   BUN 23 08/11/2016 0833   CREATININE 1.21 (H) 02/16/2017 1325   CREATININE 1.08 (H) 10/20/2016 1549   CALCIUM 10.2 02/16/2017 1325   PROT 7.0 12/26/2019 0903   PROT 7.2 08/11/2016 0833   ALBUMIN 4.6 10/20/2016 1549   ALBUMIN 4.5 08/11/2016 0833   AST 42 (H) 12/26/2019 0903   ALT 42 (H) 12/26/2019 0903   ALKPHOS 45 10/20/2016 1549   BILITOT 0.3 12/26/2019 0903   BILITOT <0.2 08/11/2016 0833   GFRNONAA 45 (L) 02/16/2017 1325   GFRNONAA 53 (L) 10/20/2016 1549   GFRAA 52 (L) 02/16/2017 1325   GFRAA 61 10/20/2016 1549   Lab Results  Component Value Date   CHOL 216 (H) 02/11/2014   HDL 65 02/11/2014   LDLCALC 133 (H) 02/11/2014   TRIG 92 02/11/2014   CHOLHDL 3.3 02/11/2014   Lab Results  Component Value Date   HGBA1C 7.2 (H) 02/11/2014    No results found for: VITAMINB12 Lab Results  Component Value Date   TSH 1.680 09/10/2020     ASSESSMENT AND PLAN 75 y.o. year old female  has a past medical history of Arthritis, Cataract, Diabetes mellitus, Diabetic neuropathy ([place]), Diabetic neuropathy, type II diabetes mellitus ([place]), Hepatitis C, Hypertension, Hypokalemia, Knee pain, Morbidly obese ([place]), Nocturnal seizures ([place]) (05/14/2015), OSA on CPAP, and Plantar fasciitis. here with:  1.  Nocturnal seizures 2.  Sleep apnea, on CPAP 3.  Diabetic peripheral neuropathy  -Remains overall stable, encouraged compliance with CPAP nightly greater than 4 hours, and to continue with seizure medications -Refill sent for Keppra, PCP fills Lyrica -Call for seizure activity, has follow-up appointment in October for CPAP, at that time, will try to get back on track for annual follow-up for both seizure and CPAP  I spent 20 minutes of face-to-face and non-face-to-face time with patient.  This included previsit chart review, lab review, study review, order entry, electronic health record documentation, patient education.  Butler Denmark, AGNP-C, DNP 01/07/2021, 7:36 AM [facility] [address] [place], [place] [address] [phone]

## 2021-01-08 ENCOUNTER — Telehealth: Payer: Self-pay

## 2021-01-08 NOTE — Telephone Encounter (Signed)
Called patient, advised that I had received information from [place] patient assistance regarding the forms we had sent in for patient. It states she must reach out to social security by the number provided on the form to determine if she is eligible for low income subsidy. If she is not- they will re-consider her for patient assistance. I contact patient and notified her of this, she will call the number and she will let us know the outcome of this information so we can know to resubmit her information to patient assistance.  Patient verbalized understanding, I will scan paperwork into chart.

## 2021-01-09 NOTE — Progress Notes (Signed)
I have read the note, and I agree with the clinical assessment and plan.  Aleksia Freiman K Michaline Kindig   

## 2021-01-21 DIAGNOSIS — F410 Panic disorder [episodic paroxysmal anxiety] without agoraphobia: Secondary | ICD-10-CM | POA: Diagnosis not present

## 2021-01-21 DIAGNOSIS — I10 Essential (primary) hypertension: Secondary | ICD-10-CM | POA: Diagnosis not present

## 2021-01-21 DIAGNOSIS — E1169 Type 2 diabetes mellitus with other specified complication: Secondary | ICD-10-CM | POA: Diagnosis not present

## 2021-01-22 ENCOUNTER — Other Ambulatory Visit: Payer: Self-pay

## 2021-01-22 ENCOUNTER — Ambulatory Visit (INDEPENDENT_AMBULATORY_CARE_PROVIDER_SITE_OTHER): Payer: HMO | Admitting: Orthopaedic Surgery

## 2021-01-22 ENCOUNTER — Encounter: Payer: Self-pay | Admitting: Orthopaedic Surgery

## 2021-01-22 VITALS — Ht 64.0 in | Wt 234.0 lb

## 2021-01-22 DIAGNOSIS — M19042 Primary osteoarthritis, left hand: Secondary | ICD-10-CM

## 2021-01-22 MED ORDER — LIDOCAINE HCL 1 % IJ SOLN
0.5000 mL | INTRAMUSCULAR | Status: AC | PRN
Start: 2021-01-22 — End: 2021-01-22
  Administered 2021-01-22: .5 mL

## 2021-01-22 MED ORDER — METHYLPREDNISOLONE ACETATE 40 MG/ML IJ SUSP
20.0000 mg | INTRAMUSCULAR | Status: AC | PRN
Start: 2021-01-22 — End: 2021-01-22
  Administered 2021-01-22: 20 mg

## 2021-01-22 NOTE — Progress Notes (Signed)
Office Visit Note   Patient: Cathy Gonzalez           Date of Birth: 11-06-1945           MRN: [id number] Visit Date: 01/22/2021              Requested by: [name], [address] [place],  [place] [address] PCP: [name], MD   Assessment & Plan: Visit Diagnoses:  1. Primary osteoarthritis, left hand     Plan: Mrs.  Gonzalez is experiencing recurrent symptoms of osteoarthritis of the left hand metacarpal phalangeal joint of the index finger.  She has limited range of motion and some swelling.  She has had a prior cortisone injection with excellent response.  We will reinject the joint today after further discussion.  She is fully aware that there is definitive procedure with surgery but she would like to wait.  She is also having reasonable amount of pain at the base of the thumb with extensive arthritis but she notes that she has been using diclofenac gel and medicines which help and did not want the injection in that joint  Follow-Up Instructions: Return if symptoms worsen or fail to improve.   Orders:  Orders Placed This Encounter  Procedures  . Hand/UE Inj: L index MCP   No orders of the defined types were placed in this encounter.     Procedures: Hand/UE Inj: L index MCP for osteoarthritis on 01/22/2021 3:18 PM Details: dorsal approach Medications: 0.5 mL lidocaine 1 %; 20 mg methylPREDNISolone acetate 40 MG/ML      Clinical Data: No additional findings.   Subjective: Chief Complaint  Patient presents with  . Left Hand - Follow-up, Pain  Patient presents today for left hand pain. She was last here two months ago and received an injection in her left index finger. She said that she cannot tell if the injection helped or not. She said that the pain comes and goes. She does not take anything specifically for this hand pain.  Has a history of gout and diabetes  HPI  Review of Systems   Objective: Vital Signs: Ht 5\' 4"  (1.626 m)   Wt 234 lb (106.1 kg)    BMI 40.17 kg/m   Physical Exam Constitutional:      Appearance: She is well-developed.  Eyes:     Pupils: Pupils are equal, round, and reactive to light.  Pulmonary:     Effort: Pulmonary effort is normal.  Skin:    General: Skin is warm and dry.  Neurological:     Mental Status: She is alert and oriented to person, place, and time.  Psychiatric:        Behavior: Behavior normal.     Ortho Exam gouty changes of DIP joint right index finger with minimal discomfort.  Left hand with swelling in the dorsum of the hand and tenderness of the metacarpal phalangeal joint of the index finger.  Has painful flexion extension but no crepitation.  Able to extend the finger and flex.  Has hypertrophic changes at the base of the thumb with positive grind test and some limitation of motion.  Sensory exam intact  Specialty Comments:  No specialty comments available.  Imaging: No results found.   PMFS History: Patient Active Problem List   Diagnosis Date Noted  . Primary osteoarthritis, left hand 11/07/2020  . Arthritis of carpometacarpal (CMC) joint of thumb 07/18/2020  . Bilateral primary osteoarthritis of knee 06/20/2020  .  Bilateral primary osteoarthritis of hip 06/07/2020  . Right thigh pain 05/09/2020  . Low back pain 07/14/2019  . OSA on CPAP 05/16/2019  . Diabetic neuropathy associated with type 2 diabetes mellitus ([place]) 12/13/2018  . Chronic pain of right knee 12/09/2018  . Thumb pain, left 12/09/2018  . Dizziness and giddiness 01/14/2016  . Hepatic cirrhosis ([place]) 07/26/2015  . Nocturnal seizures ([place]) 05/14/2015  . Chronic hepatitis C without hepatic coma ([place]) 03/14/2015  . Vertigo, central 02/11/2014  . Difficulty walking 02/11/2014  . Diabetes mellitus ([place]) 02/11/2014  . Dysphagia 02/11/2014  . OBESITY 07/26/2010  . Essential hypertension, benign 07/26/2010  . DEGENERATIVE JOINT DISEASE, GENERALIZED 07/26/2010   Past Medical History:  Diagnosis Date  . Arthritis    . Cataract    left cataract removed in 2017  . Diabetes mellitus   . Diabetic neuropathy ([place])   . Diabetic neuropathy, type II diabetes mellitus ([place])   . Hepatitis C    took tx for   . Hypertension   . Hypokalemia   . Knee pain    right   . Morbidly obese ([place])   . Nocturnal seizures ([place]) 05/14/2015   no bad seizure since 1980's  . OSA on CPAP    uses cpap 2-3 week  . Plantar fasciitis    both feet    Family History  Problem Relation Age of Onset  . Diabetes Mother   . Heart attack Mother   . Diabetes Sister   . Diabetes Brother   . Cancer Sister        breast  . Breast cancer Sister   . Seizures Neg Hx     Past Surgical History:  Procedure Laterality Date  . ABDOMINAL HYSTERECTOMY     partial  . CATARACT EXTRACTION Left   . COLONOSCOPY WITH PROPOFOL N/A 08/21/2015   Procedure: COLONOSCOPY WITH PROPOFOL;  Surgeon: Carol Ada, MD;  [facility];  Service: Endoscopy;  Laterality: N/A;  . ESOPHAGOGASTRODUODENOSCOPY (EGD) WITH PROPOFOL N/A 08/21/2015   Procedure: ESOPHAGOGASTRODUODENOSCOPY (EGD) WITH PROPOFOL;  Surgeon: Carol Ada, MD;  [facility];  Service: Endoscopy;  Laterality: N/A;  . ESOPHAGOGASTRODUODENOSCOPY (EGD) WITH PROPOFOL N/A 10/08/2018   Procedure: ESOPHAGOGASTRODUODENOSCOPY (EGD) WITH PROPOFOL;  Surgeon: Carol Ada, MD;  [facility];  Service: Endoscopy;  Laterality: N/A;  . EYE SURGERY Bilateral    cataracts   Social History   Occupational History  . Occupation: [employer]  Tobacco Use  . Smoking status: Never Smoker  . Smokeless tobacco: Never Used  Vaping Use  . Vaping Use: Never used  Substance and Sexual Activity  . Alcohol use: No    Alcohol/week: 0.0 standard drinks  . Drug use: No  . Sexual activity: Never

## 2021-01-23 NOTE — Telephone Encounter (Signed)
Returned call to patient who states that she spoke with social security and she does not want to apply for medicaid at this time. Patient states that she wants to go with the patient assistance program for Jardiance. Patient states that she needs the paper work that she sent to the office for the assistance program mailed to her.   Patient also requesting samples in the mean time.   Advised patient that 3 bottles of samples have been placed at the front desk.   Jardiance 10mg  LOT: [id number] EXP: 5/24  Advised patient that I would forward message to Almyra Free for review once she is back in office. Patient aware of samples and will pick up tomorrow.   Advised patient to call back to office with any issues, questions, or concerns. Patient verbalized understanding.

## 2021-01-23 NOTE — Telephone Encounter (Signed)
Follow Up:     Pt says she needs to get the name and the phone number of the company that makes Jardiance please. She also wants to know if you have any samples.    Patient calling the office for samples of medication:   1.  What medication and dosage are you requesting samples for? Jardiance 2.  Are you currently out of this medication?  Just a few

## 2021-01-25 NOTE — Telephone Encounter (Signed)
Made copies of the patient assistance and mailed out to patient.

## 2021-02-12 ENCOUNTER — Ambulatory Visit: Payer: HMO | Admitting: Podiatry

## 2021-02-12 ENCOUNTER — Other Ambulatory Visit: Payer: Self-pay

## 2021-02-12 ENCOUNTER — Encounter: Payer: Self-pay | Admitting: Podiatry

## 2021-02-12 DIAGNOSIS — M778 Other enthesopathies, not elsewhere classified: Secondary | ICD-10-CM

## 2021-02-12 DIAGNOSIS — Z860100 Personal history of colon polyps, unspecified: Secondary | ICD-10-CM | POA: Insufficient documentation

## 2021-02-12 DIAGNOSIS — R197 Diarrhea, unspecified: Secondary | ICD-10-CM | POA: Insufficient documentation

## 2021-02-12 DIAGNOSIS — K766 Portal hypertension: Secondary | ICD-10-CM | POA: Insufficient documentation

## 2021-02-12 DIAGNOSIS — M79674 Pain in right toe(s): Secondary | ICD-10-CM | POA: Diagnosis not present

## 2021-02-12 DIAGNOSIS — B351 Tinea unguium: Secondary | ICD-10-CM

## 2021-02-12 DIAGNOSIS — E1142 Type 2 diabetes mellitus with diabetic polyneuropathy: Secondary | ICD-10-CM | POA: Diagnosis not present

## 2021-02-12 DIAGNOSIS — E119 Type 2 diabetes mellitus without complications: Secondary | ICD-10-CM | POA: Insufficient documentation

## 2021-02-12 DIAGNOSIS — Z1211 Encounter for screening for malignant neoplasm of colon: Secondary | ICD-10-CM | POA: Insufficient documentation

## 2021-02-12 DIAGNOSIS — M79675 Pain in left toe(s): Secondary | ICD-10-CM | POA: Diagnosis not present

## 2021-02-12 DIAGNOSIS — R945 Abnormal results of liver function studies: Secondary | ICD-10-CM | POA: Insufficient documentation

## 2021-02-12 DIAGNOSIS — R1033 Periumbilical pain: Secondary | ICD-10-CM | POA: Insufficient documentation

## 2021-02-12 DIAGNOSIS — K219 Gastro-esophageal reflux disease without esophagitis: Secondary | ICD-10-CM | POA: Insufficient documentation

## 2021-02-12 DIAGNOSIS — R7989 Other specified abnormal findings of blood chemistry: Secondary | ICD-10-CM | POA: Insufficient documentation

## 2021-02-12 MED ORDER — TRIAMCINOLONE ACETONIDE 40 MG/ML IJ SUSP
40.0000 mg | Freq: Once | INTRAMUSCULAR | Status: AC
  Administered 2021-02-12: 40 mg

## 2021-02-12 NOTE — Progress Notes (Signed)
She presents today states the top of her foot is hurting.  States that even the toes are starting to burn as she refers to the bilateral foot.  Objective: Vital signs are stable she alert and oriented x3 neuropathy dorsal aspect of the bilateral foot currently she is taking the maximum dose of Lyrica 300 mg twice daily.  She has pain on palpation dorsal aspect of the midfoot with radiating pains distally.  Assessment: Neuritis/neuropathy bilateral.  Capsulitis dorsal aspect bilateral foot.  Plan: Injected the dorsal aspect of the bilateral foot today.  10 mg of Kenalog was used bilaterally we did discuss possible increased her blood sugar she understands how to take care of this

## 2021-02-26 ENCOUNTER — Ambulatory Visit: Payer: HMO | Admitting: Podiatry

## 2021-02-26 DIAGNOSIS — G40001 Localization-related (focal) (partial) idiopathic epilepsy and epileptic syndromes with seizures of localized onset, not intractable, with status epilepticus: Secondary | ICD-10-CM | POA: Diagnosis not present

## 2021-02-26 DIAGNOSIS — E1169 Type 2 diabetes mellitus with other specified complication: Secondary | ICD-10-CM | POA: Diagnosis not present

## 2021-02-26 DIAGNOSIS — I10 Essential (primary) hypertension: Secondary | ICD-10-CM | POA: Diagnosis not present

## 2021-02-28 ENCOUNTER — Encounter: Payer: Self-pay | Admitting: Family Medicine

## 2021-02-28 ENCOUNTER — Other Ambulatory Visit: Payer: Self-pay

## 2021-02-28 ENCOUNTER — Ambulatory Visit (INDEPENDENT_AMBULATORY_CARE_PROVIDER_SITE_OTHER): Payer: HMO | Admitting: Family Medicine

## 2021-02-28 DIAGNOSIS — M25561 Pain in right knee: Secondary | ICD-10-CM | POA: Diagnosis not present

## 2021-02-28 NOTE — Progress Notes (Signed)
Office Visit Note   Patient: Cathy Gonzalez           Date of Birth: October 26, 1945           MRN: [id number] Visit Date: 02/28/2021 Requested by: [name], MD [address] [place],  [place] [address] PCP: [name], MD  Subjective: Chief Complaint  Patient presents with  . Right Knee - Pain    Worsening pain in the right knee, medial and anterior, mainly with standing. Pops when she rolls over at night. Knee swells. Dr. Durward Fortes aspirated it last time she was here for it. Wears a knee support -- does help when sitting, but not with standing/walking.    HPI: She is here with right knee pain.  Symptoms started couple days ago, she has actually had gradually worsening pain over the past couple weeks but it has become very uncomfortable to stand and walk.  She is wearing a brace which gives her a little bit of relief.  She had similar symptoms in the past and did well with aspiration and injection.  She wonders whether she needs that again.  She works at a [employer].              ROS:   All other systems were reviewed and are negative.  Objective: Vital Signs: There were no vitals taken for this visit.  Physical Exam:  General:  Alert and oriented, in no acute distress. Pulm:  Breathing unlabored. Psy:  Normal mood, congruent affect. Skin: No erythema or warmth Right knee: 1+ effusion, tender on the medial joint line.  Slight pain with patellar compression.  Imaging: No results found.  Assessment & Plan: 1.  Right knee effusion, possibly due to DJD versus degenerative meniscus tear -We will aspirate and inject 1 more time.  If she fails to get long-term relief, consider additional imaging.     Procedures: Right knee aspiration and injection: After sterile prep with Betadine, injected 3 cc 1% lidocaine without epinephrine then aspirated 10 cc of blood-tinged synovial fluid, then injected 6 mg betamethasone from superolateral approach.       PMFS  History: Patient Active Problem List   Diagnosis Date Noted  . Abnormal liver function tests 02/12/2021  . Diarrhea 02/12/2021  . Gastro-esophageal reflux disease without esophagitis 02/12/2021  . Periumbilical pain 59/16/3846  . Personal history of colonic polyps 02/12/2021  . Portal hypertension ([place]) 02/12/2021  . Screening for malignant neoplasm of colon 02/12/2021  . Type 2 diabetes mellitus without complications ([place]) 65/99/3570  . Primary osteoarthritis, left hand 11/07/2020  . Arthritis of carpometacarpal (CMC) joint of thumb 07/18/2020  . Bilateral primary osteoarthritis of knee 06/20/2020  . Bilateral primary osteoarthritis of hip 06/07/2020  . Right thigh pain 05/09/2020  . Low back pain 07/14/2019  . OSA on CPAP 05/16/2019  . Diabetic neuropathy associated with type 2 diabetes mellitus ([place]) 12/13/2018  . Chronic pain of right knee 12/09/2018  . Thumb pain, left 12/09/2018  . Dizziness and giddiness 01/14/2016  . Hepatic cirrhosis ([place]) 07/26/2015  . Nocturnal seizures ([place]) 05/14/2015  . Chronic hepatitis C without hepatic coma ([place]) 03/14/2015  . Vertigo, central 02/11/2014  . Difficulty walking 02/11/2014  . Diabetes mellitus ([place]) 02/11/2014  . Dysphagia 02/11/2014  . OBESITY 07/26/2010  . Essential hypertension, benign 07/26/2010  . DEGENERATIVE JOINT DISEASE, GENERALIZED 07/26/2010   Past Medical History:  Diagnosis Date  . Arthritis   . Cataract    left cataract removed  in 2017  . Diabetes mellitus   . Diabetic neuropathy ([place])   . Diabetic neuropathy, type II diabetes mellitus ([place])   . Hepatitis C    took tx for   . Hypertension   . Hypokalemia   . Knee pain    right   . Morbidly obese ([place])   . Nocturnal seizures ([place]) 05/14/2015   no bad seizure since 1980's  . OSA on CPAP    uses cpap 2-3 week  . Plantar fasciitis    both feet    Family History  Problem Relation Age of Onset  . Diabetes Mother   . Heart attack Mother   . Diabetes  Sister   . Diabetes Brother   . Cancer Sister        breast  . Breast cancer Sister   . Seizures Neg Hx     Past Surgical History:  Procedure Laterality Date  . ABDOMINAL HYSTERECTOMY     partial  . CATARACT EXTRACTION Left   . COLONOSCOPY WITH PROPOFOL N/A 08/21/2015   Procedure: COLONOSCOPY WITH PROPOFOL;  Surgeon: Carol Ada, MD;  [facility];  Service: Endoscopy;  Laterality: N/A;  . ESOPHAGOGASTRODUODENOSCOPY (EGD) WITH PROPOFOL N/A 08/21/2015   Procedure: ESOPHAGOGASTRODUODENOSCOPY (EGD) WITH PROPOFOL;  Surgeon: Carol Ada, MD;  [facility];  Service: Endoscopy;  Laterality: N/A;  . ESOPHAGOGASTRODUODENOSCOPY (EGD) WITH PROPOFOL N/A 10/08/2018   Procedure: ESOPHAGOGASTRODUODENOSCOPY (EGD) WITH PROPOFOL;  Surgeon: Carol Ada, MD;  [facility];  Service: Endoscopy;  Laterality: N/A;  . EYE SURGERY Bilateral    cataracts   Social History   Occupational History  . Occupation: [employer]  Tobacco Use  . Smoking status: Never Smoker  . Smokeless tobacco: Never Used  Vaping Use  . Vaping Use: Never used  Substance and Sexual Activity  . Alcohol use: No    Alcohol/week: 0.0 standard drinks  . Drug use: No  . Sexual activity: Never

## 2021-03-05 ENCOUNTER — Other Ambulatory Visit: Payer: Self-pay | Admitting: Family Medicine

## 2021-03-05 DIAGNOSIS — Z1231 Encounter for screening mammogram for malignant neoplasm of breast: Secondary | ICD-10-CM

## 2021-03-11 ENCOUNTER — Telehealth: Payer: Self-pay | Admitting: Adult Health

## 2021-03-11 NOTE — Telephone Encounter (Signed)
Pt called wanting to inform provider that she will have to be off her cpap machine for the next couple of days due to her having to wait till a new mask is shipped to her. Pt stated that if the RN is needing more information please call pt.

## 2021-03-12 ENCOUNTER — Encounter: Payer: Self-pay | Admitting: Family Medicine

## 2021-03-14 DIAGNOSIS — G4733 Obstructive sleep apnea (adult) (pediatric): Secondary | ICD-10-CM | POA: Diagnosis not present

## 2021-03-25 DIAGNOSIS — E1169 Type 2 diabetes mellitus with other specified complication: Secondary | ICD-10-CM | POA: Diagnosis not present

## 2021-03-25 DIAGNOSIS — Z6839 Body mass index (BMI) 39.0-39.9, adult: Secondary | ICD-10-CM | POA: Diagnosis not present

## 2021-03-25 DIAGNOSIS — K2100 Gastro-esophageal reflux disease with esophagitis, without bleeding: Secondary | ICD-10-CM | POA: Diagnosis not present

## 2021-03-25 DIAGNOSIS — Z9111 Patient's noncompliance with dietary regimen: Secondary | ICD-10-CM | POA: Diagnosis not present

## 2021-03-25 DIAGNOSIS — I10 Essential (primary) hypertension: Secondary | ICD-10-CM | POA: Diagnosis not present

## 2021-04-01 ENCOUNTER — Ambulatory Visit: Payer: HMO

## 2021-04-04 DIAGNOSIS — E118 Type 2 diabetes mellitus with unspecified complications: Secondary | ICD-10-CM | POA: Diagnosis not present

## 2021-04-04 DIAGNOSIS — E785 Hyperlipidemia, unspecified: Secondary | ICD-10-CM | POA: Diagnosis not present

## 2021-04-04 DIAGNOSIS — I10 Essential (primary) hypertension: Secondary | ICD-10-CM | POA: Diagnosis not present

## 2021-04-16 DIAGNOSIS — G4733 Obstructive sleep apnea (adult) (pediatric): Secondary | ICD-10-CM | POA: Diagnosis not present

## 2021-04-30 ENCOUNTER — Ambulatory Visit
Admission: RE | Admit: 2021-04-30 | Discharge: 2021-04-30 | Disposition: A | Discharge status: Home / Self Care | Payer: HMO | Source: Ambulatory Visit | Attending: Family Medicine | Admitting: Family Medicine

## 2021-04-30 ENCOUNTER — Other Ambulatory Visit: Payer: Self-pay

## 2021-04-30 DIAGNOSIS — Z1231 Encounter for screening mammogram for malignant neoplasm of breast: Secondary | ICD-10-CM

## 2021-05-05 DIAGNOSIS — E118 Type 2 diabetes mellitus with unspecified complications: Secondary | ICD-10-CM | POA: Diagnosis not present

## 2021-05-05 DIAGNOSIS — E785 Hyperlipidemia, unspecified: Secondary | ICD-10-CM | POA: Diagnosis not present

## 2021-05-05 DIAGNOSIS — I10 Essential (primary) hypertension: Secondary | ICD-10-CM | POA: Diagnosis not present

## 2021-05-08 DIAGNOSIS — E785 Hyperlipidemia, unspecified: Secondary | ICD-10-CM | POA: Diagnosis not present

## 2021-05-08 DIAGNOSIS — I10 Essential (primary) hypertension: Secondary | ICD-10-CM | POA: Diagnosis not present

## 2021-05-08 DIAGNOSIS — E1169 Type 2 diabetes mellitus with other specified complication: Secondary | ICD-10-CM | POA: Diagnosis not present

## 2021-05-13 DIAGNOSIS — N189 Chronic kidney disease, unspecified: Secondary | ICD-10-CM | POA: Diagnosis not present

## 2021-05-13 DIAGNOSIS — G40001 Localization-related (focal) (partial) idiopathic epilepsy and epileptic syndromes with seizures of localized onset, not intractable, with status epilepticus: Secondary | ICD-10-CM | POA: Diagnosis not present

## 2021-05-13 DIAGNOSIS — E1169 Type 2 diabetes mellitus with other specified complication: Secondary | ICD-10-CM | POA: Diagnosis not present

## 2021-05-13 DIAGNOSIS — I10 Essential (primary) hypertension: Secondary | ICD-10-CM | POA: Diagnosis not present

## 2021-05-13 DIAGNOSIS — G720 Drug-induced myopathy: Secondary | ICD-10-CM | POA: Diagnosis not present

## 2021-05-27 ENCOUNTER — Ambulatory Visit (INDEPENDENT_AMBULATORY_CARE_PROVIDER_SITE_OTHER): Payer: HMO | Admitting: Family Medicine

## 2021-05-27 ENCOUNTER — Other Ambulatory Visit: Payer: Self-pay

## 2021-05-27 DIAGNOSIS — E6609 Other obesity due to excess calories: Secondary | ICD-10-CM | POA: Diagnosis not present

## 2021-05-27 DIAGNOSIS — I10 Essential (primary) hypertension: Secondary | ICD-10-CM | POA: Diagnosis not present

## 2021-05-27 DIAGNOSIS — E1169 Type 2 diabetes mellitus with other specified complication: Secondary | ICD-10-CM | POA: Diagnosis not present

## 2021-05-27 DIAGNOSIS — R600 Localized edema: Secondary | ICD-10-CM | POA: Diagnosis not present

## 2021-05-27 DIAGNOSIS — M25561 Pain in right knee: Secondary | ICD-10-CM

## 2021-05-27 DIAGNOSIS — F4320 Adjustment disorder, unspecified: Secondary | ICD-10-CM | POA: Diagnosis not present

## 2021-05-27 DIAGNOSIS — G40001 Localization-related (focal) (partial) idiopathic epilepsy and epileptic syndromes with seizures of localized onset, not intractable, with status epilepticus: Secondary | ICD-10-CM | POA: Diagnosis not present

## 2021-05-27 DIAGNOSIS — Z6841 Body Mass Index (BMI) 40.0 and over, adult: Secondary | ICD-10-CM | POA: Diagnosis not present

## 2021-05-27 NOTE — Progress Notes (Signed)
Office Visit Note   Patient: Cathy Gonzalez           Date of Birth: [birth year]           MRN: [id number] Visit Date: 05/27/2021 Requested by: [name], MD [address] [place],  [place] [address] PCP: [name], MD  Subjective: Chief Complaint  Patient presents with   Right Leg - Pain    Has a knot that comes up in her posterior hip/buttock sometimes, with pain radiating down the leg to the foot. Pain in both of the feet, with swelling -- using diclofenac on the feet. Both lower legs feel "tight." Seeing PCP later today.    HPI: She is here with ongoing right leg pain.  Symptoms started from the posterior hip and traveled down the leg.  The more she is on her feet the more the lower leg hurts.  She gets a lot of swelling in her leg by the end of the day.  The swelling improves overnight.  She was recently tested for her kidney function and liver function and this was normal per her report.  She has had cardiac work-up which was normal as well.                ROS:   All other systems were reviewed and are negative.  Objective: Vital Signs: There were no vitals taken for this visit.  Physical Exam:  General:  Alert and oriented, in no acute distress. Pulm:  Breathing unlabored. Psy:  Normal mood, congruent affect.  Right leg: She is tender in the gluteus medius area.  Straight leg raise negative.  She has 2+ pitting edema in both legs to the knees.   Imaging: No results found.  Assessment & Plan: Ongoing right leg pain, suspect either venous insufficiency or potentially vein compression proximally. -She has an appointment with Dr. Criss Rosales today to discuss this.  Would contemplate vascular referral or possibly CT scan of abdomen pelvis.     Procedures: No procedures performed        PMFS History: Patient Active Problem List   Diagnosis Date Noted   Abnormal liver function tests 02/12/2021   Diarrhea 02/12/2021   Gastro-esophageal reflux disease without  esophagitis [id number]   Periumbilical pain [id number]   Personal history of colonic polyps 02/12/2021   Portal hypertension ([place]) 02/12/2021   Screening for malignant neoplasm of colon 02/12/2021   Type 2 diabetes mellitus without complications ([place]) [id number]   Primary osteoarthritis, left hand 11/07/2020   Arthritis of carpometacarpal ([place]) joint of thumb 07/18/2020   Bilateral primary osteoarthritis of knee 06/20/2020   Bilateral primary osteoarthritis of hip 06/07/2020   Right thigh pain 05/09/2020   Low back pain 07/14/2019   OSA on CPAP 05/16/2019   Diabetic neuropathy associated with type 2 diabetes mellitus ([place]) 12/13/2018   Chronic pain of right knee 12/09/2018   Thumb pain, left 12/09/2018   Dizziness and giddiness 01/14/2016   Hepatic cirrhosis ([place]) 07/26/2015   Nocturnal seizures ([place]) 05/14/2015   Chronic hepatitis C without hepatic coma ([place]) 03/14/2015   Vertigo, central 02/11/2014   Difficulty walking 02/11/2014   Diabetes mellitus ([place]) 02/11/2014   Dysphagia 02/11/2014   OBESITY 07/26/2010   Essential hypertension, benign 07/26/2010   DEGENERATIVE JOINT DISEASE, GENERALIZED 07/26/2010   Past Medical History:  Diagnosis Date   Arthritis    Cataract    left cataract removed in 2017   Diabetes mellitus    Diabetic neuropathy (  [place])    Diabetic neuropathy, type II diabetes mellitus ([place])    Hepatitis C    took tx for    Hypertension    Hypokalemia    Knee pain    right    Morbidly obese ([place])    Nocturnal seizures ([place]) 05/14/2015   no bad seizure since 1980's   OSA on CPAP    uses cpap 2-3 week   Plantar fasciitis    both feet    Family History  Problem Relation Age of Onset   Diabetes Mother    Heart attack Mother    Diabetes Sister    Diabetes Brother    Cancer Sister        breast   Breast cancer Sister    Seizures Neg Hx     Past Surgical History:  Procedure Laterality Date   ABDOMINAL HYSTERECTOMY     partial   CATARACT  EXTRACTION Left    COLONOSCOPY WITH PROPOFOL N/A 08/21/2015   Procedure: COLONOSCOPY WITH PROPOFOL;  Surgeon: Carol Ada, MD;  [facility];  Service: Endoscopy;  Laterality: N/A;   ESOPHAGOGASTRODUODENOSCOPY (EGD) WITH PROPOFOL N/A 08/21/2015   Procedure: ESOPHAGOGASTRODUODENOSCOPY (EGD) WITH PROPOFOL;  Surgeon: Carol Ada, MD;  [facility];  Service: Endoscopy;  Laterality: N/A;   ESOPHAGOGASTRODUODENOSCOPY (EGD) WITH PROPOFOL N/A 10/08/2018   Procedure: ESOPHAGOGASTRODUODENOSCOPY (EGD) WITH PROPOFOL;  Surgeon: Carol Ada, MD;  [facility];  Service: Endoscopy;  Laterality: N/A;   EYE SURGERY Bilateral    cataracts   Social History   Occupational History   Occupation: [employer]  Tobacco Use   Smoking status: Never   Smokeless tobacco: Never  Vaping Use   Vaping Use: Never used  Substance and Sexual Activity   Alcohol use: No    Alcohol/week: 0.0 standard drinks   Drug use: No   Sexual activity: Never

## 2021-06-03 ENCOUNTER — Other Ambulatory Visit: Payer: Self-pay

## 2021-06-03 ENCOUNTER — Ambulatory Visit: Payer: HMO | Admitting: Podiatry

## 2021-06-03 ENCOUNTER — Encounter: Payer: Self-pay | Admitting: Podiatry

## 2021-06-03 DIAGNOSIS — B351 Tinea unguium: Secondary | ICD-10-CM | POA: Diagnosis not present

## 2021-06-03 DIAGNOSIS — E1140 Type 2 diabetes mellitus with diabetic neuropathy, unspecified: Secondary | ICD-10-CM | POA: Diagnosis not present

## 2021-06-03 DIAGNOSIS — M79674 Pain in right toe(s): Secondary | ICD-10-CM | POA: Diagnosis not present

## 2021-06-03 DIAGNOSIS — E1149 Type 2 diabetes mellitus with other diabetic neurological complication: Secondary | ICD-10-CM | POA: Diagnosis not present

## 2021-06-03 DIAGNOSIS — M79675 Pain in left toe(s): Secondary | ICD-10-CM

## 2021-06-03 NOTE — Patient Instructions (Signed)
[address] [place],  [place]  [address] Get Driving Directions Main: [phone]

## 2021-06-05 DIAGNOSIS — E118 Type 2 diabetes mellitus with unspecified complications: Secondary | ICD-10-CM | POA: Diagnosis not present

## 2021-06-05 DIAGNOSIS — I10 Essential (primary) hypertension: Secondary | ICD-10-CM | POA: Diagnosis not present

## 2021-06-05 DIAGNOSIS — E785 Hyperlipidemia, unspecified: Secondary | ICD-10-CM | POA: Diagnosis not present

## 2021-06-05 NOTE — Progress Notes (Signed)
  Subjective:  Patient ID: Cathy Gonzalez, female    DOB: 1946-08-04,  MRN: [id number]  Chief Complaint  Patient presents with   Diabetes    diabetic neuropathy; nail trim/callus pain-requested to see a monday proder due to work schedule    Peripheral Neuropathy    75 y.o. female presents with the above complaint. History confirmed with patient.  Diclofenac gel not been helpful her neuropathy is worsening she is tried gabapentin Lyrica  Objective:  Physical Exam: warm, good capillary refill, no trophic changes or ulcerative lesions, and normal DP and PT pulses.  Abnormal sensation Left Foot: dystrophic yellowed discolored nail plates with subungual debris Right Foot: dystrophic yellowed discolored nail plates with subungual debris  Assessment:   1. Pain due to onychomycosis of toenails of both feet   2. Diabetic neuropathy with neurologic complication [facility])      Plan:  Patient was evaluated and treated and all questions answered.  Patient educated on diabetes. Discussed proper diabetic foot care and discussed risks and complications of disease. Educated patient in depth on reasons to return to the office immediately should he/she discover anything concerning or new on the feet. All questions answered. Discussed proper shoes as well.   Her neuropathy is worsening and she has tried multiple medications.  Recommend an evaluation by the pain management clinic to see if there are any other options for primary care doctor can put her on.  Also discussed spinal cord stimulation with Dr. Davy Pique at [facility] which would be an option but she would like to avoid any procedures.  Return in about 3 months (around 09/03/2021) for at risk diabetic foot care.

## 2021-06-12 ENCOUNTER — Encounter: Payer: Self-pay | Admitting: Physical Medicine and Rehabilitation

## 2021-06-20 ENCOUNTER — Other Ambulatory Visit: Payer: Self-pay

## 2021-06-20 ENCOUNTER — Ambulatory Visit (INDEPENDENT_AMBULATORY_CARE_PROVIDER_SITE_OTHER): Payer: HMO | Admitting: Internal Medicine

## 2021-06-20 ENCOUNTER — Encounter: Payer: Self-pay | Admitting: Internal Medicine

## 2021-06-20 VITALS — BP 128/61 | HR 84 | Temp 98.3°F | Wt 237.0 lb

## 2021-06-20 DIAGNOSIS — R945 Abnormal results of liver function studies: Secondary | ICD-10-CM

## 2021-06-20 DIAGNOSIS — K7460 Unspecified cirrhosis of liver: Secondary | ICD-10-CM | POA: Diagnosis not present

## 2021-06-20 DIAGNOSIS — R7989 Other specified abnormal findings of blood chemistry: Secondary | ICD-10-CM

## 2021-06-20 LAB — COMPLETE METABOLIC PANEL WITH GFR
AG Ratio: 2 (calc) (ref 1.0–2.5)
ALT: 36 U/L — ABNORMAL HIGH (ref 6–29)
AST: 31 U/L (ref 10–35)
Albumin: 4.6 g/dL (ref 3.6–5.1)
Alkaline phosphatase (APISO): 70 U/L (ref 37–153)
BUN/Creatinine Ratio: 18 (calc) (ref 6–22)
BUN: 22 mg/dL (ref 7–25)
CO2: 29 mmol/L (ref 20–32)
Calcium: 9.8 mg/dL (ref 8.6–10.4)
Chloride: 103 mmol/L (ref 98–110)
Creat: 1.24 mg/dL — ABNORMAL HIGH (ref 0.60–1.00)
Globulin: 2.3 g/dL (calc) (ref 1.9–3.7)
Glucose, Bld: 135 mg/dL — ABNORMAL HIGH (ref 65–99)
Potassium: 3.4 mmol/L — ABNORMAL LOW (ref 3.5–5.3)
Sodium: 141 mmol/L (ref 135–146)
Total Bilirubin: 0.3 mg/dL (ref 0.2–1.2)
Total Protein: 6.9 g/dL (ref 6.1–8.1)
eGFR: 46 mL/min/{1.73_m2} — ABNORMAL LOW (ref >=60)

## 2021-06-20 NOTE — Assessment & Plan Note (Signed)
Will recheck her AST and ALT today with ongoing elevation from underlying cirrhosis.

## 2021-06-20 NOTE — Assessment & Plan Note (Addendum)
Will continues screening every 6 months with ultrasound Follow up in 1 year

## 2021-06-20 NOTE — Progress Notes (Signed)
   Subjective:    Patient ID: Cathy Gonzalez, female    DOB: Aug 14, 1946, 75 y.o.   MRN: [id number]  HPI Here for follow up of cirrhosis She has a history of chronic hepatitis C and cirrhosis noted on ultrasound and here for yearly follow up.  She last had an ultrasound in October 2021 and was without new issues.     Review of Systems  Constitutional:  Negative for fatigue.  Gastrointestinal:  Negative for diarrhea and nausea.      Objective:   Physical Exam Eyes:     General: No scleral icterus. Abdominal:     Palpations: Abdomen is soft.  Neurological:     General: No focal deficit present.     Mental Status: She is alert.  Psychiatric:        Mood and Affect: Mood normal.          Assessment & Plan:

## 2021-06-21 ENCOUNTER — Telehealth: Payer: Self-pay

## 2021-06-21 NOTE — Telephone Encounter (Signed)
Received voicemail in triage. Patient calling requesting lab results from office visit on 06/20/21 Routing to provider for advise.  Cathy Gonzalez

## 2021-06-22 DIAGNOSIS — G4733 Obstructive sleep apnea (adult) (pediatric): Secondary | ICD-10-CM | POA: Diagnosis not present

## 2021-07-01 ENCOUNTER — Ambulatory Visit
Admission: RE | Admit: 2021-07-01 | Discharge: 2021-07-01 | Disposition: A | Discharge status: Home / Self Care | Payer: HMO | Source: Ambulatory Visit | Attending: Internal Medicine | Admitting: Internal Medicine

## 2021-07-01 ENCOUNTER — Other Ambulatory Visit: Payer: Self-pay | Admitting: *Deleted

## 2021-07-01 DIAGNOSIS — M25569 Pain in unspecified knee: Secondary | ICD-10-CM

## 2021-07-01 DIAGNOSIS — K7460 Unspecified cirrhosis of liver: Secondary | ICD-10-CM

## 2021-07-01 DIAGNOSIS — K760 Fatty (change of) liver, not elsewhere classified: Secondary | ICD-10-CM | POA: Diagnosis not present

## 2021-07-04 ENCOUNTER — Encounter (HOSPITAL_COMMUNITY): Payer: Self-pay

## 2021-07-04 ENCOUNTER — Ambulatory Visit (HOSPITAL_COMMUNITY)
Admission: RE | Admit: 2021-07-04 | Discharge: 2021-07-04 | Disposition: A | Discharge status: Home / Self Care | Payer: HMO | Source: Ambulatory Visit | Attending: Vascular Surgery | Admitting: Vascular Surgery

## 2021-07-04 ENCOUNTER — Encounter: Payer: Self-pay | Admitting: Vascular Surgery

## 2021-07-04 ENCOUNTER — Other Ambulatory Visit: Payer: Self-pay

## 2021-07-04 ENCOUNTER — Ambulatory Visit: Payer: HMO | Admitting: Vascular Surgery

## 2021-07-04 VITALS — BP 141/81 | HR 77 | Temp 97.9°F | Resp 20 | Ht 64.0 in | Wt 240.0 lb

## 2021-07-04 DIAGNOSIS — M25569 Pain in unspecified knee: Secondary | ICD-10-CM | POA: Insufficient documentation

## 2021-07-04 DIAGNOSIS — I872 Venous insufficiency (chronic) (peripheral): Secondary | ICD-10-CM

## 2021-07-04 NOTE — Progress Notes (Signed)
PATIENT: Cathy Gonzalez DOB: 01/13/1946  REASON FOR VISIT: follow up HISTORY FROM: patient  Chief Complaint  Patient presents with   Obstructive Sleep Apnea    Rm 10, alone. Here for CPAP f/u. Pt reports getting tired of it some nights.   Dohmeier: sleep Willis: seizures    HISTORY OF PRESENT ILLNESS: 07/08/21 ALL: [name] returns for follow up for OSA on CPAP. She is doing well on CPAP therapy. She is using her macine almost every night. She reports feeling better rested and more energized when using CPAP. She admits that some nights she get frustrated with her mask and takes it off after getting four hours of therapy. She is working with her care team to lose weight. She has been doing exercises at home that have been helping with swelling in her legs. She was cleared to return to the gym last week.   She was seen 01/2021 for seizure follow up with Judson Roch and reportedly doing well. She continues levetiracetam 1578m BID. No seizures. She is tolerating medication well.     05/16/2019 ALL: EERANDY MCEACHERNis a 75y.o. female here today for follow up of OSA on CPAP. She has done well with therapy.  She does note that she is having some difficulty getting her nasal pillow and the correct positioning at night.  She feels that this is improving with usage.  She has noted that she is sleeping better using CPAP therapy.  Compliance report dated 04/12/2019 through 05/11/2019 reveals that she is using CPAP 30 out of the last 30 days for compliance of 100%.  28 of those days she used CPAP for greater than 4 hours for compliance of 93%.  AHI was 2.1 on 5 to 16 cm of water.  EPR of 3.  There was a mild leak noted in the 95th percentile at 20.1.  HISTORY: (copied from Dr Dohmeier's note on 02/07/2019)  HPI:  EKATORI WIRSINGis a 75y.o. female patient  wth known sleep apnea , but she couldn't use CPAP in the past.  He son has noted her snoring much louder than he remembered, and has recently spent time at her  place.  Her original sleep study took place about 2012-13, ordered by Dr BCriss Rosales She [facility] but "ch[facility] ". She has had light sleep related seizures, the last in December 2019.  I could not find out remember to use CPAP the last time, but it seems that even when she used CPAP she will use it daily but only 2 or 3 times a week.  I made her aware that with that low compliance she would not be able to get the machine's cost covered, nor the supplies- equipment.   Chief complaint according to patient : "My son told me my snoring got worse- [place]ants me to look into CPAP again".    Sleep and medical history: 'Nocturnal epilepsy" - treated by Dr WJannifer Franklinfor seizures that only occur  at night- in sleep. She is  on Keppra and Lyrica. Morbid obesity- Gout, hyperlipidemia and DM - Insulin user, also on Metformin. DM related progressed Neuropathy and Osteoarthritis. Takes multiple antihypertensives and lasix.    Family sleep history: Mother with DM and heart disease. On insulin. She passed in 226-Mar-2019  Father died when she was 89 brother has DM, sister has DM, and breast cancer,  Social history: no coffee, no iced tea, no sodas.  She worked night shifts, late shifts - now is on day shift. Part time,  4 adult children, 3 sons and one daughter are all healthy.    Sleep habits are as follows: Cathy Gonzalez reports that her breakfast time is usually around 5:30 AM, her lunchtime at 1 and her dinnertime at 5PM or as late as 6 PM.  She goes to bed early at 9 PM and she describes her bedroom as dark but she states is not cool she usually keeps it over 70 degrees. Her bedroom is not quiet but did not explicitly tell me what she meant by this.   She sleeps mostly on her side, sometimes she sleeps on 2 ,sometimes on 3 pillows.  She goes to the bathroom at least twice at night and states that she takes diuretics.   Her first bathroom  break is between midnight and 1 AM, her second between 3 and 4 AM , and she states that she does dream but not very often. She rises at 4.30 AM to go to work. She works part time.    REVIEW OF SYSTEMS: Out of a complete 14 system review of symptoms, the patient complains only of the following symptoms, dizziness, numbness, tremors and all other reviewed systems are negative.  Epworth sleepiness scale: 6  ALLERGIES: Allergies  Allergen Reactions   Acetaminophen     Other reaction(s): GI upset   Pregabalin     Other reaction(s): diarrhea    HOME MEDICATIONS: Outpatient Medications Prior to Visit  Medication Sig Dispense Refill   acetaminophen (TYLENOL) 500 MG tablet Take 500 mg by mouth every 6 (six) hours as needed for moderate pain.     amLODipine (NORVASC) 10 MG tablet Take 10 mg by mouth daily. Takes half     Blood Glucose Monitoring Suppl (ONE TOUCH ULTRA 2) w/Device KIT 2 (two) times daily. as directed     colchicine 0.6 MG tablet Take 0.6 mg by mouth as needed (gout).      diclofenac Sodium (VOLTAREN) 1 % GEL SMARTSIG:2 Inch(es) Topical 4 Times Daily     empagliflozin (JARDIANCE) 10 MG TABS tablet Take 1 tablet (10 mg total) by mouth daily before breakfast. 90 tablet 3   ezetimibe (ZETIA) 10 MG tablet Take 10 mg by mouth at bedtime.      furosemide (LASIX) 40 MG tablet Take 40 mg by mouth daily.      metFORMIN (GLUCOPHAGE) 500 MG tablet Take 500 mg by mouth at bedtime.      montelukast (SINGULAIR) 10 MG tablet Take 10 mg by mouth at bedtime.     ONE TOUCH ULTRA TEST test strip   5   ONETOUCH DELICA LANCETS 98Y MISC   3   Potassium Chloride ER 20 MEQ TBCR Take 1 tablet by mouth 2 (two) times daily.  6   pregabalin (LYRICA) 300 MG capsule Take 1 capsule (300 mg total) by mouth 2 (two) times daily. 60 capsule 0   sucralfate (CARAFATE) 1 G tablet Take 1 g by mouth 2 (two) times daily.      telmisartan (MICARDIS) 80 MG tablet Take 80 mg by mouth daily.   1   levETIRAcetam (KEPPRA)  750 MG tablet Take 2 tablets (1,500 mg total) by mouth 2 (two) times daily. 360 tablet 4   No facility-administered medications prior to visit.    PAST MEDICAL HISTORY: Past Medical History:  Diagnosis Date   Arthritis  Cataract    left cataract removed in 2017   Diabetes mellitus    Diabetic neuropathy ([place])    Diabetic neuropathy, type II diabetes mellitus ([place])    Hepatitis C    took tx for    Hypertension    Hypokalemia    Knee pain    right    Morbidly obese ([place])    Nocturnal seizures ([place]) 05/14/2015   no bad seizure since 1980's   OSA on CPAP    uses cpap 2-3 week   Plantar fasciitis    both feet    PAST SURGICAL HISTORY: Past Surgical History:  Procedure Laterality Date   ABDOMINAL HYSTERECTOMY     partial   CATARACT EXTRACTION Left    COLONOSCOPY WITH PROPOFOL N/A 08/21/2015   Procedure: COLONOSCOPY WITH PROPOFOL;  Surgeon: Carol Ada, MD;  Location: [facility];  Service: Endoscopy;  Laterality: N/A;   ESOPHAGOGASTRODUODENOSCOPY (EGD) WITH PROPOFOL N/A 08/21/2015   Procedure: ESOPHAGOGASTRODUODENOSCOPY (EGD) WITH PROPOFOL;  Surgeon: Carol Ada, MD;  Location: [facility];  Service: Endoscopy;  Laterality: N/A;   ESOPHAGOGASTRODUODENOSCOPY (EGD) WITH PROPOFOL N/A 10/08/2018   Procedure: ESOPHAGOGASTRODUODENOSCOPY (EGD) WITH PROPOFOL;  Surgeon: Carol Ada, MD;  Location: [facility];  Service: Endoscopy;  Laterality: N/A;   EYE SURGERY Bilateral    cataracts    FAMILY HISTORY: Family History  Problem Relation Age of Onset   Diabetes Mother    Heart attack Mother    Diabetes Sister    Diabetes Brother    Cancer Sister        breast   Breast cancer Sister    Seizures Neg Hx     SOCIAL HISTORY: Social History   Socioeconomic History   Marital status: Single    Spouse name: Not on file   Number of children: 4   Years of education: 12   Highest education level: Not on file  Occupational History   Occupation: [employer]  Tobacco Use    Smoking status: Never   Smokeless tobacco: Never  Vaping Use   Vaping Use: Never used  Substance and Sexual Activity   Alcohol use: No    Alcohol/week: 0.0 standard drinks   Drug use: No   Sexual activity: Never  Other Topics Concern   Not on file  Social History Narrative   Patient occasionally drinks caffeine.   Patient is right handed.   Social Determinants of Health   Financial Resource Strain: Not on file  Food Insecurity: Not on file  Transportation Needs: Not on file  Physical Activity: Not on file  Stress: Not on file  Social Connections: Not on file  Intimate Partner Violence: Not on file      PHYSICAL EXAM  Vitals:   07/08/21 0700  BP: (!) 144/80  Pulse: 75  Weight: 242 lb (109.8 kg)  Height: '5\' 4"'  (1.626 m)    Body mass index is 41.54 kg/m.  Generalized: Well developed, in no acute distress  Cardiology: normal rate and rhythm, no murmur noted Respiratory: Clear to auscultation bilaterally Neurological examination  Mentation: Alert oriented to time, place, history taking. Follows all commands speech and language fluent Cranial nerve II-XII: Pupils were equal round reactive to light. Extraocular movements were full, visual field were full on confrontational test. Facial sensation and strength were normal. Uvula tongue midline. Head turning and shoulder shrug  were normal and symmetric. Gait and station: Gait is normal.   DIAGNOSTIC DATA (LABS, IMAGING, TESTING) - I reviewed patient records, labs, notes, testing  and imaging myself where available.  No flowsheet data found.   Lab Results  Component Value Date   WBC 7.3 02/16/2017   HGB 13.9 02/16/2017   HCT 41.7 02/16/2017   MCV 88.7 02/16/2017   PLT 230 02/16/2017      Component Value Date/Time   NA 141 06/20/2021 1349   NA 140 08/11/2016 0833   K 3.4 (L) 06/20/2021 1349   CL 103 06/20/2021 1349   CO2 29 06/20/2021 1349   GLUCOSE 135 (H) 06/20/2021 1349   BUN 22 06/20/2021 1349   BUN 23  08/11/2016 0833   CREATININE 1.24 (H) 06/20/2021 1349   CALCIUM 9.8 06/20/2021 1349   PROT 6.9 06/20/2021 1349   PROT 7.2 08/11/2016 0833   ALBUMIN 4.6 10/20/2016 1549   ALBUMIN 4.5 08/11/2016 0833   AST 31 06/20/2021 1349   ALT 36 (H) 06/20/2021 1349   ALKPHOS 45 10/20/2016 1549   BILITOT 0.3 06/20/2021 1349   BILITOT <0.2 08/11/2016 0833   GFRNONAA 45 (L) 02/16/2017 1325   GFRNONAA 53 (L) 10/20/2016 1549   GFRAA 52 (L) 02/16/2017 1325   GFRAA 61 10/20/2016 1549   Lab Results  Component Value Date   CHOL 216 (H) 02/11/2014   HDL 65 02/11/2014   LDLCALC 133 (H) 02/11/2014   TRIG 92 02/11/2014   CHOLHDL 3.3 02/11/2014   Lab Results  Component Value Date   HGBA1C 7.2 (H) 02/11/2014   No results found for: VITAMINB12 Lab Results  Component Value Date   TSH 1.680 09/10/2020      ASSESSMENT AND PLAN 75 y.o. year old female  has a past medical history of Arthritis, Cataract, Diabetes mellitus, Diabetic neuropathy ([place]), Diabetic neuropathy, type II diabetes mellitus ([place]), Hepatitis C, Hypertension, Hypokalemia, Knee pain, Morbidly obese ([place]), Nocturnal seizures ([place]) (05/14/2015), OSA on CPAP, and Plantar fasciitis. here with     ICD-10-CM   1. OSA on CPAP  G47.33 For home use only DME continuous positive airway pressure (CPAP)   Z99.89     2. Nocturnal seizures [facility])  R56.9        Mrs Dimiceli is doing well on CPAP therapy.  Compliance report reveals excellent compliance. She was encouraged to continue using CPAP therapy nightly and for greater than 4 hours each night. She will continue levetiracetam as directed for seizure management. She will follow-up in 1 year for both seizure and CPAP management. She verbalizes understanding and agreement with this plan.   Orders Placed This Encounter  Procedures   For home use only DME continuous positive airway pressure (CPAP)    Supplies    Order Specific Question:   Length of Need    Answer:   Lifetime    Order Specific  Question:   Patient has OSA or probable OSA    Answer:   Yes    Order Specific Question:   Is the patient currently using CPAP in the home    Answer:   Yes    Order Specific Question:   Settings    Answer:   Other see comments    Order Specific Question:   CPAP supplies needed    Answer:   Mask, headgear, cushions, filters, heated tubing and water chamber      Meds ordered this encounter  Medications   levETIRAcetam (KEPPRA) 750 MG tablet    Sig: Take 2 tablets (1,500 mg total) by mouth 2 (two) times daily.    Dispense:  360 tablet    Refill:  4    Order Specific Question:   Supervising Provider    Answer:   Melvenia Beam [[id number]]       DBN RWKET, FNP-C 07/08/2021, 7:28 AM [facility] [address], [place],  [address] [phone]

## 2021-07-04 NOTE — Progress Notes (Signed)
ASSESSMENT & PLAN   CHRONIC VENOUS INSUFFICIENCY: This patient has CEAP C4 venous disease (hyperpigmentation).  We have discussed the importance of intermittent leg elevation and the proper positioning for this.  I have encouraged her to wear her compression stockings especially at work.  We discussed importance of exercise specifically walking and water aerobics.  I have encouraged her to avoid prolonged sitting and standing.  We also discussed the importance of maintaining a healthy weight as this can increase lower extremity venous pressure.  If her symptoms progress or her swelling worsens I think she should have formal venous reflux testing.  Her symptoms are more significant on the right side.  She will call if her symptoms worsen.  REASON FOR CONSULT:    Bilateral lower extremity swelling.  The consult is requested by Dr. [name].  HPI:   Cathy Gonzalez is a 75 y.o. female who noted the gradual onset of swelling in both ankles about a month ago.  Her swelling is more significant on the right side.  She works in a job that requires her to sit for 4 hours.  The swelling is worse after work and worse at the end of the day.  She does describe some aching pain and heaviness in her legs which is associated with standing and sitting and relieved somewhat with elevation.  She is had no previous history of DVT or phlebitis.  I do not get any history of claudication or rest pain.  She does have a history of neuropathy related to her diabetes.  Past Medical History:  Diagnosis Date   Arthritis    Cataract    left cataract removed in 2017   Diabetes mellitus    Diabetic neuropathy ([place])    Diabetic neuropathy, type II diabetes mellitus ([place])    Hepatitis C    took tx for    Hypertension    Hypokalemia    Knee pain    right    Morbidly obese ([facility])    Nocturnal seizures ([place]) 05/14/2015   no bad seizure since 1980's   OSA on CPAP    uses cpap 2-3 week   Plantar fasciitis    both feet     Family History  Problem Relation Age of Onset   Diabetes Mother    Heart attack Mother    Diabetes Sister    Diabetes Brother    Cancer Sister        breast   Breast cancer Sister    Seizures Neg Hx     SOCIAL HISTORY: Social History   Tobacco Use   Smoking status: Never   Smokeless tobacco: Never  Substance Use Topics   Alcohol use: No    Alcohol/week: 0.0 standard drinks    Allergies  Allergen Reactions   Acetaminophen     Other reaction(s): GI upset   Pregabalin     Other reaction(s): diarrhea    Current Outpatient Medications  Medication Sig Dispense Refill   acetaminophen (TYLENOL) 500 MG tablet Take 500 mg by mouth every 6 (six) hours as needed for moderate pain.     amLODipine (NORVASC) 10 MG tablet Take 10 mg by mouth daily. Takes half     Blood Glucose Monitoring Suppl (ONE TOUCH ULTRA 2) w/Device KIT 2 (two) times daily. as directed     colchicine 0.6 MG tablet Take 0.6 mg by mouth as needed (gout).      diclofenac Sodium (VOLTAREN) 1 % GEL SMARTSIG:2 Inch(es) Topical 4  Times Daily     empagliflozin (JARDIANCE) 10 MG TABS tablet Take 1 tablet (10 mg total) by mouth daily before breakfast. 90 tablet 3   ezetimibe (ZETIA) 10 MG tablet Take 10 mg by mouth at bedtime.      furosemide (LASIX) 40 MG tablet Take 40 mg by mouth daily.      levETIRAcetam (KEPPRA) 750 MG tablet Take 2 tablets (1,500 mg total) by mouth 2 (two) times daily. 360 tablet 4   metFORMIN (GLUCOPHAGE) 500 MG tablet Take 500 mg by mouth at bedtime.      montelukast (SINGULAIR) 10 MG tablet Take 10 mg by mouth at bedtime.     ONE TOUCH ULTRA TEST test strip   5   ONETOUCH DELICA LANCETS 20U MISC   3   Potassium Chloride ER 20 MEQ TBCR Take 1 tablet by mouth 2 (two) times daily.  6   pregabalin (LYRICA) 300 MG capsule Take 1 capsule (300 mg total) by mouth 2 (two) times daily. 60 capsule 0   sucralfate (CARAFATE) 1 G tablet Take 1 g by mouth 2 (two) times daily.      telmisartan (MICARDIS)  80 MG tablet Take 80 mg by mouth daily.   1   No current facility-administered medications for this visit.    REVIEW OF SYSTEMS:  _0  denotes positive finding, _1  denotes negative finding Cardiac  Comments:  Chest pain or chest pressure:    Shortness of breath upon exertion:    Short of breath when lying flat:    Irregular heart rhythm:        Vascular    Pain in calf, thigh, or hip brought on by ambulation:    Pain in feet at night that wakes you up from your sleep:     Blood clot in your veins:    Leg swelling:  x       Pulmonary    Oxygen at home:    Productive cough:     Wheezing:         Neurologic    Sudden weakness in arms or legs:     Sudden numbness in arms or legs:     Sudden onset of difficulty speaking or slurred speech:    Temporary loss of vision in one eye:     Problems with dizziness:         Gastrointestinal    Blood in stool:     Vomited blood:         Genitourinary    Burning when urinating:     Blood in urine:        Psychiatric    Major depression:         Hematologic    Bleeding problems:    Problems with blood clotting too easily:        Skin    Rashes or ulcers:        Constitutional    Fever or chills:    -  PHYSICAL EXAM:   Vitals:   07/04/21 1518  BP: (!) 141/81  Pulse: 77  Resp: 20  Temp: 97.9 F (36.6 C)  SpO2: 95%  Weight: 240 lb (108.9 kg)  Height: _2  (1.626 m)   Body mass index is 41.2 kg/m.  GENERAL: The patient is a well-nourished female, in no acute distress. The vital signs are documented above. CARDIAC: There is a regular rate and rhythm.  VASCULAR: I do not detect carotid bruits. She has palpable pedal pulses.  She has mild bilateral lower extremity swelling. She has hyperpigmentation consistent with chronic venous insufficiency.  She does not have any large dilated varicose veins on exam. PULMONARY: There is good air exchange bilaterally without wheezing or rales. ABDOMEN: Soft and non-tender with  normal pitched bowel sounds.  MUSCULOSKELETAL: There are no major deformities. NEUROLOGIC: No focal weakness or paresthesias are detected. SKIN: There are no ulcers or rashes noted. PSYCHIATRIC: The patient has a normal affect.  DATA:    ARTERIAL DOPPLER STUDY: I have independently interpreted her arterial Doppler study today.  On the right side there is a biphasic posterior tibial signal with a triphasic dorsalis pedis signal.  ABIs 100%.  Toe pressures 151 mmHg.  On the left side there is a triphasic dorsalis pedis and posterior tibial signal.  ABIs 100%.  Toe pressure is 144 mmHg.  Deitra Mayo [facility]

## 2021-07-04 NOTE — Patient Instructions (Signed)
Please continue using your CPAP regularly. While your insurance requires that you use CPAP at least 4 hours each night on 70% of the nights, I recommend, that you not skip any nights and use it throughout the night if you can. Getting used to CPAP and staying with the treatment long term does take time and patience and discipline. Untreated obstructive sleep apnea when it is moderate to severe can have an adverse impact on cardiovascular health and raise her risk for heart disease, arrhythmias, hypertension, congestive heart failure, stroke and diabetes. Untreated obstructive sleep apnea causes sleep disruption, nonrestorative sleep, and sleep deprivation. This can have an impact on your day to day functioning and cause daytime sleepiness and impairment of cognitive function, memory loss, mood disturbance, and problems focussing. Using CPAP regularly can improve these symptoms.   Continue levetiracetam 1500mg  twice daily.   Follow up with Judson Roch in 1 year for both seizure and CPAP follow up.

## 2021-07-05 DIAGNOSIS — E785 Hyperlipidemia, unspecified: Secondary | ICD-10-CM | POA: Diagnosis not present

## 2021-07-05 DIAGNOSIS — E118 Type 2 diabetes mellitus with unspecified complications: Secondary | ICD-10-CM | POA: Diagnosis not present

## 2021-07-05 DIAGNOSIS — I10 Essential (primary) hypertension: Secondary | ICD-10-CM | POA: Diagnosis not present

## 2021-07-08 ENCOUNTER — Ambulatory Visit (INDEPENDENT_AMBULATORY_CARE_PROVIDER_SITE_OTHER): Payer: HMO | Admitting: Family Medicine

## 2021-07-08 ENCOUNTER — Encounter: Payer: Self-pay | Admitting: Family Medicine

## 2021-07-08 VITALS — BP 144/80 | HR 75 | Ht 64.0 in | Wt 242.0 lb

## 2021-07-08 DIAGNOSIS — G4733 Obstructive sleep apnea (adult) (pediatric): Secondary | ICD-10-CM

## 2021-07-08 DIAGNOSIS — N390 Urinary tract infection, site not specified: Secondary | ICD-10-CM | POA: Diagnosis not present

## 2021-07-08 DIAGNOSIS — Z0001 Encounter for general adult medical examination with abnormal findings: Secondary | ICD-10-CM | POA: Diagnosis not present

## 2021-07-08 DIAGNOSIS — I10 Essential (primary) hypertension: Secondary | ICD-10-CM | POA: Diagnosis not present

## 2021-07-08 DIAGNOSIS — E1169 Type 2 diabetes mellitus with other specified complication: Secondary | ICD-10-CM | POA: Diagnosis not present

## 2021-07-08 DIAGNOSIS — E6609 Other obesity due to excess calories: Secondary | ICD-10-CM | POA: Diagnosis not present

## 2021-07-08 DIAGNOSIS — R569 Unspecified convulsions: Secondary | ICD-10-CM | POA: Diagnosis not present

## 2021-07-08 DIAGNOSIS — Z9989 Dependence on other enabling machines and devices: Secondary | ICD-10-CM

## 2021-07-08 DIAGNOSIS — I739 Peripheral vascular disease, unspecified: Secondary | ICD-10-CM | POA: Diagnosis not present

## 2021-07-08 MED ORDER — LEVETIRACETAM 750 MG PO TABS: 1500.0000 mg | ORAL_TABLET | Freq: Two times a day (BID) | ORAL | 4 refills | Status: DC

## 2021-07-22 ENCOUNTER — Encounter (HOSPITAL_COMMUNITY): Payer: Self-pay | Admitting: Emergency Medicine

## 2021-07-22 ENCOUNTER — Ambulatory Visit (HOSPITAL_COMMUNITY)
Admission: EM | Admit: 2021-07-22 | Discharge: 2021-07-22 | Disposition: A | Discharge status: Home / Self Care | Payer: HMO

## 2021-07-22 ENCOUNTER — Other Ambulatory Visit: Payer: Self-pay

## 2021-07-22 DIAGNOSIS — Z780 Asymptomatic menopausal state: Secondary | ICD-10-CM | POA: Diagnosis not present

## 2021-07-22 DIAGNOSIS — K219 Gastro-esophageal reflux disease without esophagitis: Secondary | ICD-10-CM | POA: Diagnosis not present

## 2021-07-22 NOTE — Discharge Instructions (Signed)
Pay attention to what foods worsen your symptoms - keeping a food diary can help.   Follow up with your GI doctor (gastroenterologist) Dr. Benson Norway.   Try taking TUMS when your symptoms are bothering you.   Swallow your pills one at a time with your head down.

## 2021-07-22 NOTE — ED Triage Notes (Signed)
Pt presents with mid chest pain with movement and burping all day.

## 2021-07-22 NOTE — ED Provider Notes (Signed)
[facility]    CSN: [id number] Arrival date & time: 07/22/21  1306      History   Chief Complaint Chief Complaint  Patient presents with   Chest Pain    HPI Cathy Gonzalez is a 75 y.o. female.  Patient reports several days of epigastric pain and pressure associated with increased burping.  Patient also reports that she feels like her pills are getting stuck in her throat when she swallows them and this did does not usually happen.  She does swallow all of her pills in 1 handful.  Reports she has a history of hiatal hernia.  She also has a history of GERD for which she takes sucralfate.  Some foods make her symptoms worse and some do not have an impact but she cannot recall which foods are problematic.  Patient has a GI doctor Dr. Benson Norway but she has not seen him in 2 years  Chest Pain Associated symptoms: abdominal pain   Associated symptoms: no fever, no nausea and no vomiting    Past Medical History:  Diagnosis Date   Arthritis    Cataract    left cataract removed in 2017   Diabetes mellitus    Diabetic neuropathy ([place])    Diabetic neuropathy, type II diabetes mellitus ([place])    Hepatitis C    took tx for    Hypertension    Hypokalemia    Knee pain    right    Morbidly obese ([place])    Nocturnal seizures ([place]) 05/14/2015   no bad seizure since 1980's   OSA on CPAP    uses cpap 2-3 week   Plantar fasciitis    both feet    Patient Active Problem List   Diagnosis Date Noted   Abnormal liver function tests 02/12/2021   Diarrhea 02/12/2021   Gastro-esophageal reflux disease without esophagitis 86/57/8469   Periumbilical pain 62/95/2841   Personal history of colonic polyps 02/12/2021   Portal hypertension ([place]) 02/12/2021   Screening for malignant neoplasm of colon 02/12/2021   Type 2 diabetes mellitus without complications ([place]) 32/44/0102   Primary osteoarthritis, left hand 11/07/2020   Arthritis of carpometacarpal ([place]) joint of thumb 07/18/2020    Bilateral primary osteoarthritis of knee 06/20/2020   Bilateral primary osteoarthritis of hip 06/07/2020   Right thigh pain 05/09/2020   Low back pain 07/14/2019   OSA on CPAP 05/16/2019   Diabetic neuropathy associated with type 2 diabetes mellitus ([place]) 12/13/2018   Chronic pain of right knee 12/09/2018   Thumb pain, left 12/09/2018   Dizziness and giddiness 01/14/2016   Hepatic cirrhosis ([place]) 07/26/2015   Nocturnal seizures ([place]) 05/14/2015   Chronic hepatitis C without hepatic coma ([place]) 03/14/2015   Vertigo, central 02/11/2014   Difficulty walking 02/11/2014   Diabetes mellitus ([place]) 02/11/2014   Dysphagia 02/11/2014   OBESITY 07/26/2010   Essential hypertension, benign 07/26/2010   DEGENERATIVE JOINT DISEASE, GENERALIZED 07/26/2010    Past Surgical History:  Procedure Laterality Date   ABDOMINAL HYSTERECTOMY     partial   CATARACT EXTRACTION Left    COLONOSCOPY WITH PROPOFOL N/A 08/21/2015   Procedure: COLONOSCOPY WITH PROPOFOL;  Surgeon: Carol Ada, MD;  Location: [facility];  Service: Endoscopy;  Laterality: N/A;   ESOPHAGOGASTRODUODENOSCOPY (EGD) WITH PROPOFOL N/A 08/21/2015   Procedure: ESOPHAGOGASTRODUODENOSCOPY (EGD) WITH PROPOFOL;  Surgeon: Carol Ada, MD;  Location: [facility];  Service: Endoscopy;  Laterality: N/A;   ESOPHAGOGASTRODUODENOSCOPY (EGD) WITH PROPOFOL N/A 10/08/2018   Procedure: ESOPHAGOGASTRODUODENOSCOPY (EGD) WITH  PROPOFOL;  Surgeon: Hung, Patrick, MD;  [facility]: [facility];  Service: Endoscopy;  Laterality: N/A;   EYE SURGERY Bilateral    cataracts    OB History   No obstetric history on file.      Home Medications    Prior to Admission medications   Medication Sig Start Date End Date Taking? Authorizing Provider  acetaminophen (TYLENOL) 500 MG tablet Take 500 mg by mouth every 6 (six) hours as needed for moderate pain.    [provider]  amLODipine (NORVASC) 10 MG tablet Take 10 mg by mouth daily. Takes half    [provider]  Blood Glucose Monitoring Suppl (ONE TOUCH ULTRA 2) w/Device KIT 2 (two) times daily. as directed 01/24/19   [provider]  colchicine 0.6 MG tablet Take 0.6 mg by mouth as needed (gout).     [provider]  diclofenac Sodium (VOLTAREN) 1 % GEL SMARTSIG:2 Inch(es) Topical 4 Times Daily 08/17/19   [provider]  empagliflozin (JARDIANCE) 10 MG TABS tablet Take 1 tablet (10 mg total) by mouth daily before breakfast. 12/10/20   O'Neal, May Thomas, MD  ezetimibe (ZETIA) 10 MG tablet Take 10 mg by mouth at bedtime.  02/16/18   [provider]  furosemide (LASIX) 40 MG tablet Take 40 mg by mouth daily.  12/03/16   [provider]  levETIRAcetam (KEPPRA) 750 MG tablet Take 2 tablets (1,500 mg total) by mouth 2 (two) times daily. 07/08/21   Lomax, Amy, NP  metFORMIN (GLUCOPHAGE) 500 MG tablet Take 500 mg by mouth at bedtime.     [provider]  montelukast (SINGULAIR) 10 MG tablet Take 10 mg by mouth at bedtime.    [provider]  ONE TOUCH ULTRA TEST test strip  02/06/18   [provider]  ONETOUCH DELICA LANCETS 33G MISC  02/06/18   [provider]  Potassium Chloride ER 20 MEQ TBCR Take 1 tablet by mouth 2 (two) times daily. 04/14/18   [provider]  pregabalin (LYRICA) 300 MG capsule Take 1 capsule (300 mg total) by mouth 2 (two) times daily. 08/26/17   Hyatt, Max T, DPM  sucralfate (CARAFATE) 1 G tablet Take 1 g by mouth 2 (two) times daily.     [provider]  telmisartan (MICARDIS) 80 MG tablet Take 80 mg by mouth daily.  01/21/18   [provider]    Family History Family History  Problem Relation Age of Onset   Diabetes Mother    Heart attack Mother    Diabetes Sister    Diabetes Brother    Cancer Sister        breast   Breast cancer Sister    Seizures Neg Hx     Social History Social History   Tobacco Use   Smoking status: Never   Smokeless tobacco: Never   Vaping Use   Vaping Use: Never used  Substance Use Topics   Alcohol use: No    Alcohol/week: 0.0 standard drinks   Drug use: No     Allergies   Acetaminophen and Pregabalin   Review of Systems Review of Systems  Constitutional:  Negative for chills and fever.  HENT:  Negative for congestion and postnasal drip.   Cardiovascular:  Positive for chest pain.  Gastrointestinal:  Positive for abdominal pain. Negative for nausea and vomiting.    Physical Exam Triage Vital Signs ED Triage Vitals  Enc Vitals Group     BP 07/22/21 1539 (!)   159/94     Pulse Rate 07/22/21 1539 89     Resp 07/22/21 1539 18     Temp 07/22/21 1539 98.1 F (36.7 C)     Temp Source 07/22/21 1539 Oral     SpO2 07/22/21 1539 98 %     Weight --      Height --      Head Circumference --      Peak Flow --      Pain Score 07/22/21 1538 3     Pain Loc --      Pain Edu? --      Excl. in GC? --    No data found.  Updated Vital Signs BP (!) 159/94 (BP Location: Right Arm)   Pulse 89   Temp 98.1 F (36.7 C) (Oral)   Resp 18   SpO2 98%   Visual Acuity Right Eye Distance:   Left Eye Distance:   Bilateral Distance:    Right Eye Near:   Left Eye Near:    Bilateral Near:     Physical Exam Constitutional:      Appearance: She is well-developed. She is obese. She is not ill-appearing.  Cardiovascular:     Rate and Rhythm: Normal rate and regular rhythm.     Heart sounds: Normal heart sounds.  Pulmonary:     Effort: Pulmonary effort is normal.     Breath sounds: Normal breath sounds.  Abdominal:     General: Bowel sounds are normal. There is no abdominal bruit.     Palpations: Abdomen is soft.     Tenderness: There is abdominal tenderness in the epigastric area. There is no guarding or rebound.  Neurological:     Mental Status: She is alert.     UC Treatments / Results  Labs (all labs ordered are listed, but only abnormal results are displayed) Labs Reviewed - No data to  display  EKG   Radiology No results found.  Procedures Procedures (including critical care time)  Medications Ordered in UC Medications - No data to display  Initial Impression / Assessment and Plan / UC Course  I have reviewed the triage vital signs and the nursing notes.  Pertinent labs & imaging results that were available during my care of the patient were reviewed by me and considered in my medical decision making (see chart for details).  Patient believes she has a hiatal hernia but I am not seeing test results that confirmed this in her medical record.  She does have a GI doctor, Dr. Hung, and I suggested she go back to see him.  I recommended she keep a food diary to try to identify triggers, and that she add Tums as needed to see if that helps relieve her symptoms.  I also suggested she swallow 1 pill at a time instead of a handful at a time.  Final Clinical Impressions(s) / UC Diagnoses   Final diagnoses:  Gastroesophageal reflux disease, unspecified whether esophagitis present     Discharge Instructions      Pay attention to what foods worsen your symptoms - keeping a food diary can help.   Follow up with your GI doctor (gastroenterologist) Dr. Hung.   Try taking TUMS when your symptoms are bothering you.   Swallow your pills one at a time with your head down.    ED Prescriptions   None    PDMP not reviewed this encounter.   Kabbe, Angela M, NP 07/22/21 1626  

## 2021-07-23 DIAGNOSIS — G4733 Obstructive sleep apnea (adult) (pediatric): Secondary | ICD-10-CM | POA: Diagnosis not present

## 2021-07-29 DIAGNOSIS — R0781 Pleurodynia: Secondary | ICD-10-CM | POA: Diagnosis not present

## 2021-08-05 DIAGNOSIS — I10 Essential (primary) hypertension: Secondary | ICD-10-CM | POA: Diagnosis not present

## 2021-08-05 DIAGNOSIS — E785 Hyperlipidemia, unspecified: Secondary | ICD-10-CM | POA: Diagnosis not present

## 2021-08-05 DIAGNOSIS — E118 Type 2 diabetes mellitus with unspecified complications: Secondary | ICD-10-CM | POA: Diagnosis not present

## 2021-09-04 DIAGNOSIS — E118 Type 2 diabetes mellitus with unspecified complications: Secondary | ICD-10-CM | POA: Diagnosis not present

## 2021-09-04 DIAGNOSIS — R0781 Pleurodynia: Secondary | ICD-10-CM | POA: Diagnosis not present

## 2021-09-04 DIAGNOSIS — I10 Essential (primary) hypertension: Secondary | ICD-10-CM | POA: Diagnosis not present

## 2021-09-04 DIAGNOSIS — E785 Hyperlipidemia, unspecified: Secondary | ICD-10-CM | POA: Diagnosis not present

## 2021-09-09 ENCOUNTER — Encounter: Payer: Self-pay | Admitting: Podiatry

## 2021-09-09 ENCOUNTER — Other Ambulatory Visit: Payer: Self-pay

## 2021-09-09 ENCOUNTER — Ambulatory Visit (INDEPENDENT_AMBULATORY_CARE_PROVIDER_SITE_OTHER): Payer: HMO | Admitting: Podiatry

## 2021-09-09 DIAGNOSIS — M79675 Pain in left toe(s): Secondary | ICD-10-CM | POA: Diagnosis not present

## 2021-09-09 DIAGNOSIS — M79674 Pain in right toe(s): Secondary | ICD-10-CM

## 2021-09-09 DIAGNOSIS — E119 Type 2 diabetes mellitus without complications: Secondary | ICD-10-CM

## 2021-09-09 DIAGNOSIS — E1140 Type 2 diabetes mellitus with diabetic neuropathy, unspecified: Secondary | ICD-10-CM

## 2021-09-09 DIAGNOSIS — B351 Tinea unguium: Secondary | ICD-10-CM

## 2021-09-09 DIAGNOSIS — E1149 Type 2 diabetes mellitus with other diabetic neurological complication: Secondary | ICD-10-CM

## 2021-09-09 DIAGNOSIS — M2141 Flat foot [pes planus] (acquired), right foot: Secondary | ICD-10-CM

## 2021-09-09 NOTE — Progress Notes (Signed)
ANNUAL DIABETIC FOOT EXAM  Subjective: Cathy Gonzalez presents today for for annual diabetic foot examination, at risk foot care with history of diabetic neuropathy, and painful thick toenails that are difficult to trim. Pain interferes with ambulation. Aggravating factors include wearing enclosed shoe gear. Pain is relieved with periodic professional debridement..  Patient denies any h/o foot wounds.  Patient has been diagnosed with neuropathy and takes pregabalin for it. She states it does not seem to be working.  Cathy Lei, MD is patient's PCP. Last visit was September, 2022.  Past Medical History:  Diagnosis Date   Arthritis    Cataract    left cataract removed in 2017   Diabetes mellitus    Diabetic neuropathy ([place])    Diabetic neuropathy, type II diabetes mellitus ([place])    Hepatitis C    took tx for    Hypertension    Hypokalemia    Knee pain    right    Morbidly obese ([place])    Nocturnal seizures ([place]) 05/14/2015   no bad seizure since 1980's   OSA on CPAP    uses cpap 2-3 week   Plantar fasciitis    both feet   Patient Active Problem List   Diagnosis Date Noted   Abnormal liver function tests 02/12/2021   Diarrhea 02/12/2021   Gastro-esophageal reflux disease without esophagitis 36/14/4315   Periumbilical pain 40/05/6760   Personal history of colonic polyps 02/12/2021   Portal hypertension ([place]) 02/12/2021   Screening for malignant neoplasm of colon 02/12/2021   Type 2 diabetes mellitus without complications ([place]) 95/06/3266   Primary osteoarthritis, left hand 11/07/2020   Arthritis of carpometacarpal ([place]) joint of thumb 07/18/2020   Bilateral primary osteoarthritis of knee 06/20/2020   Bilateral primary osteoarthritis of hip 06/07/2020   Right thigh pain 05/09/2020   Low back pain 07/14/2019   OSA on CPAP 05/16/2019   Diabetic neuropathy associated with type 2 diabetes mellitus ([place]) 12/13/2018   Chronic pain of right knee 12/09/2018   Thumb pain, left  12/09/2018   Dizziness and giddiness 01/14/2016   Hepatic cirrhosis ([place]) 07/26/2015   Nocturnal seizures ([place]) 05/14/2015   Chronic hepatitis C without hepatic coma ([place]) 03/14/2015   Vertigo, central 02/11/2014   Difficulty walking 02/11/2014   Diabetes mellitus ([place]) 02/11/2014   Dysphagia 02/11/2014   OBESITY 07/26/2010   Essential hypertension, benign 07/26/2010   DEGENERATIVE JOINT DISEASE, GENERALIZED 07/26/2010   Past Surgical History:  Procedure Laterality Date   ABDOMINAL HYSTERECTOMY     partial   CATARACT EXTRACTION Left    COLONOSCOPY WITH PROPOFOL N/A 08/21/2015   Procedure: COLONOSCOPY WITH PROPOFOL;  Surgeon: Carol Ada, MD;  [facility];  Service: Endoscopy;  Laterality: N/A;   ESOPHAGOGASTRODUODENOSCOPY (EGD) WITH PROPOFOL N/A 08/21/2015   Procedure: ESOPHAGOGASTRODUODENOSCOPY (EGD) WITH PROPOFOL;  Surgeon: Carol Ada, MD;  [facility];  Service: Endoscopy;  Laterality: N/A;   ESOPHAGOGASTRODUODENOSCOPY (EGD) WITH PROPOFOL N/A 10/08/2018   Procedure: ESOPHAGOGASTRODUODENOSCOPY (EGD) WITH PROPOFOL;  Surgeon: Carol Ada, MD;  [facility];  Service: Endoscopy;  Laterality: N/A;   EYE SURGERY Bilateral    cataracts   Current Outpatient Medications on File Prior to Visit  Medication Sig Dispense Refill   acetaminophen (TYLENOL) 500 MG tablet Take 500 mg by mouth every 6 (six) hours as needed for moderate pain.     amLODipine (NORVASC) 10 MG tablet Take 10 mg by mouth daily. Takes half     Blood Glucose Monitoring Suppl (ONE TOUCH ULTRA  2) w/Device KIT 2 (two) times daily. as directed     colchicine 0.6 MG tablet Take 0.6 mg by mouth as needed (gout).      diclofenac Sodium (VOLTAREN) 1 % GEL SMARTSIG:2 Inch(es) Topical 4 Times Daily     empagliflozin (JARDIANCE) 10 MG TABS tablet Take 1 tablet (10 mg total) by mouth daily before breakfast. 90 tablet 3   ezetimibe (ZETIA) 10 MG tablet Take 10 mg by mouth at bedtime.      furosemide  (LASIX) 40 MG tablet Take 40 mg by mouth daily.      levETIRAcetam (KEPPRA) 750 MG tablet Take 2 tablets (1,500 mg total) by mouth 2 (two) times daily. 360 tablet 4   metFORMIN (GLUCOPHAGE) 500 MG tablet Take 500 mg by mouth at bedtime.      montelukast (SINGULAIR) 10 MG tablet Take 10 mg by mouth at bedtime.     ONE TOUCH ULTRA TEST test strip   5   ONETOUCH DELICA LANCETS 27X MISC   3   Potassium Chloride ER 20 MEQ TBCR Take 1 tablet by mouth 2 (two) times daily.  6   pregabalin (LYRICA) 300 MG capsule Take 1 capsule (300 mg total) by mouth 2 (two) times daily. 60 capsule 0   sucralfate (CARAFATE) 1 G tablet Take 1 g by mouth 2 (two) times daily.      telmisartan (MICARDIS) 80 MG tablet Take 80 mg by mouth daily.   1   No current facility-administered medications on file prior to visit.    Allergies  Allergen Reactions   Acetaminophen     Other reaction(s): GI upset   Pregabalin     Other reaction(s): diarrhea   Social History   Occupational History   Occupation: [employer]  Tobacco Use   Smoking status: Never   Smokeless tobacco: Never  Vaping Use   Vaping Use: Never used  Substance and Sexual Activity   Alcohol use: No    Alcohol/week: 0.0 standard drinks   Drug use: No   Sexual activity: Never   Family History  Problem Relation Age of Onset   Diabetes Mother    Heart attack Mother    Diabetes Sister    Diabetes Brother    Cancer Sister        breast   Breast cancer Sister    Seizures Neg Hx    Immunization History  Administered Date(s) Administered   Hepatitis A, Adult 03/14/2015, 10/11/2015   Hepatitis B, adult 03/14/2015, 04/19/2015, 10/11/2015   Influenza, High Dose Seasonal PF 07/28/2019   Influenza, Seasonal, Injecte, Preservative Fre 09/14/2016   Influenza,inj,Quad PF,6+ Mos 07/26/2015   Influenza-Unspecified 08/06/2018     Review of Systems: Negative except as noted in the HPI.   Objective: There were no vitals filed for this visit.  Cathy Gonzalez  is a pleasant 75 y.o. female in NAD. AAO X 3.  Vascular Examination: CFT <3 seconds b/l LE. Palpable DP pulse(s) b/l LE. Palpable PT pulse(s) b/l LE. Pedal hair absent. No pain with calf compression RLE. Trace edema noted BLE. No cyanosis or clubbing noted b/l LE.  Dermatological Examination: Pedal integument with normal turgor, texture and tone b/l LE. No open wounds b/l. No interdigital macerations b/l. Toenails L 2nd toe, L 3rd toe, L 4th toe, L 5th toe, R 3rd toe, R 4th toe, and R 5th toe elongated, thickened, discolored with subungual debris. +Tenderness with dorsal palpation of nailplates. No hyperkeratotic or porokeratotic lesions present. Anonychia noted  bilateral great toes and R 2nd toe. Nailbed(s) epithelialized.  No hyperkeratotic nor porokeratotic lesions present on today's visit. Hypopigmentation noted dorsolateral aspect of midfoot.  Musculoskeletal Examination: Normal muscle strength 5/5 to all lower extremity muscle groups bilaterally. HAV with bunion deformity noted b/l LE. Pes planus deformity noted right lower extremity.. No pain, crepitus or joint limitation noted with ROM b/l LE.  Patient ambulates independently without assistive aids.  Footwear Assessment: Does the patient wear appropriate shoes? Yes. Does the patient need inserts/orthotics? Yes.  Neurological Examination: Protective sensation diminished with 10g monofilament b/l. Vibratory sensation intact b/l.  Vascular [facility] ABI with/without TBI on 07/04/2021: [place] STUDY   Patient Name:  TED LEONHART  Date of Exam:   07/04/2021  Medical Rec #: [id number]     Accession #:    [id number]  Date of Birth: Feb 13, 1946     Patient Gender: F  Patient Age:   75 years  Exam Location:  [facility]  Procedure:      VAS [place] ABI WITH/WO TBI  Referring Phys: CHRISTOPHER DICKSON  ---------------------------------------------------------------------------  Indications: Burning sensation in feet.    High Risk Factors: Hypertension, Diabetes, no history of smoking.   Other Factors: Nueropath.   Performing Technologist: Delorise Shiner RVT      Examination Guidelines: A complete evaluation includes at minimum, Doppler  waveform signals and systolic blood pressure reading at the level of  bilateral  brachial, anterior tibial, and posterior tibial arteries, when vessel  segments  are accessible. Bilateral testing is considered an integral part of a  complete  examination. Photoelectric Plethysmograph (PPG) waveforms and toe systolic  pressure readings are included as required and additional duplex testing  as  needed. Limited examinations for reoccurring indications may be performed  as  noted.   ABI Findings:  +---------+------------------+-----+---------+--------+  Right    Rt Pressure (mmHg)IndexWaveform Comment   +---------+------------------+-----+---------+--------+  Brachial 171                                       +---------+------------------+-----+---------+--------+  PTA      157               0.91 triphasic          +---------+------------------+-----+---------+--------+  DP       174               1.01 triphasic          +---------+------------------+-----+---------+--------+  Great Toe151               0.88                    +---------+------------------+-----+---------+--------+   +---------+------------------+-----+---------+-------+  Left     Lt Pressure (mmHg)IndexWaveform Comment  +---------+------------------+-----+---------+-------+  Brachial 172                                      +---------+------------------+-----+---------+-------+  PTA      177               1.03 triphasic         +---------+------------------+-----+---------+-------+  DP       174               1.01 triphasic         +---------+------------------+-----+---------+-------+  Great Toe144               0.84                    +---------+------------------+-----+---------+-------+   +-------+-----------+-----------+------------+------------+  ABI/TBIToday's ABIToday's TBIPrevious ABIPrevious TBI  +-------+-----------+-----------+------------+------------+  Right  1.01       0.88       0.99        1.01          +-------+-----------+-----------+------------+------------+  Left   1.03       0.84       1.05        1.03          +-------+-----------+-----------+------------+------------+   Bilateral ABIs appear essentially unchanged compared to prior study on  07/12/2019.     Summary:  Right: Resting right ankle-brachial index is within normal range. No  evidence of significant right lower extremity arterial disease. The right  toe-brachial index is normal.   Left: Resting left ankle-brachial index is within normal range. No  evidence of significant left lower extremity arterial disease. The left  toe-brachial index is normal.   *See table(s) above for measurements and observations.     Electronically signed by Deitra Mayo MD on 07/04/2021 at 5:05:44  PM.   Final    Assessment: 1. Pain due to onychomycosis of toenails of both feet   2. Diabetic neuropathy with neurologic complication ([place])   3. Acquired pes planus, right   4. Encounter for diabetic foot exam ([place])     ADA Risk Categorization: High Risk  Patient has one or more of the following: Loss of protective sensation Absent pedal pulses Severe Foot deformity History of foot ulcer  Plan: -Examined patient. -Patient to follow up with Dr. Milinda Pointer for neuropathy, but she is also followed by Neurology. -Diabetic foot examination performed today. -Patient to continue soft, supportive shoe gear daily. -Patient to continue soft, supportive shoe gear daily. Start procedure for diabetic shoes. Patient qualifies based on diagnoses. -Mycotic toenails L 2nd toe, L 3rd toe, L 4th toe, L 5th toe, R 3rd toe, R 4th toe, and R  5th toe were debrided in length and girth with sterile nail nippers and dremel without iatrogenic bleeding. -Patient/POA to call should there be question/concern in the interim.  Return in about 3 months (around 12/08/2021).  Marzetta Board, DPM

## 2021-09-13 ENCOUNTER — Other Ambulatory Visit: Payer: Self-pay

## 2021-09-13 ENCOUNTER — Encounter: Payer: HMO | Attending: Physical Medicine and Rehabilitation | Admitting: Physical Medicine and Rehabilitation

## 2021-09-13 VITALS — BP 135/78 | HR 80 | Temp 98.1°F | Ht 64.0 in | Wt 232.0 lb

## 2021-09-13 DIAGNOSIS — E1142 Type 2 diabetes mellitus with diabetic polyneuropathy: Secondary | ICD-10-CM

## 2021-09-13 DIAGNOSIS — M1A3790 Chronic gout due to renal impairment, unspecified ankle and foot, without tophus (tophi): Secondary | ICD-10-CM

## 2021-09-13 NOTE — Progress Notes (Signed)
Subjective:    Patient ID: Cathy Gonzalez, female    DOB: Jun 08, 1946, 75 y.o.   MRN: [id number]  HPI Cathy Gonzalez is a 75 year old woman who presents to establish care for diabetic peripheral neuropathy and gout.  1) Diabetic peripheral neuropathy -she takes Lyrica -eases the pain a little bit -has been present for 4 years -she does not eat salt, pasta -Her CBGs are usually 97-108. They went up last week since she was on steroids.   2) Gout -she takes colchicine -has been present for 4 years -she does not eat salt, pasta -she eats a steak every now and then, mostly be eats baked [place].   Pain Inventory Average Pain 5 Pain Right Now 0 My pain is intermittent, burning, and tingling  In the last 24 hours, has pain interfered with the following? General activity 0 Relation with others 0 Enjoyment of life 0 What TIME of day is your pain at its worst? night Sleep (in general) Good  Pain is worse with: walking and some activites Pain improves with: medication Relief from Meds: 5  ability to climb steps?  yes do you drive?  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  weakness numbness tremor tingling spasms anxiety  New pt  New pt    Family History  Problem Relation Age of Onset   Diabetes Mother    Heart attack Mother    Diabetes Sister    Diabetes Brother    Cancer Sister        breast   Breast cancer Sister    Seizures Neg Hx    Social History   Socioeconomic History   Marital status: Single    Spouse name: Not on file   Number of children: 4   Years of education: 12   Highest education level: Not on file  Occupational History   Occupation: [employer]  Tobacco Use   Smoking status: Never   Smokeless tobacco: Never  Vaping Use   Vaping Use: Never used  Substance and Sexual Activity   Alcohol use: No    Alcohol/week: 0.0 standard drinks   Drug use: No   Sexual activity: Never  Other Topics Concern   Not on file  Social  History Narrative   Patient occasionally drinks caffeine.   Patient is right handed.   Social Determinants of Health   Financial Resource Strain: Not on file  Food Insecurity: Not on file  Transportation Needs: Not on file  Physical Activity: Not on file  Stress: Not on file  Social Connections: Not on file   Past Surgical History:  Procedure Laterality Date   ABDOMINAL HYSTERECTOMY     partial   CATARACT EXTRACTION Left    COLONOSCOPY WITH PROPOFOL N/A 08/21/2015   Procedure: COLONOSCOPY WITH PROPOFOL;  Surgeon: Carol Ada, MD;  [facility];  Service: Endoscopy;  Laterality: N/A;   ESOPHAGOGASTRODUODENOSCOPY (EGD) WITH PROPOFOL N/A 08/21/2015   Procedure: ESOPHAGOGASTRODUODENOSCOPY (EGD) WITH PROPOFOL;  Surgeon: Carol Ada, MD;  [facility];  Service: Endoscopy;  Laterality: N/A;   ESOPHAGOGASTRODUODENOSCOPY (EGD) WITH PROPOFOL N/A 10/08/2018   Procedure: ESOPHAGOGASTRODUODENOSCOPY (EGD) WITH PROPOFOL;  Surgeon: Carol Ada, MD;  [facility];  Service: Endoscopy;  Laterality: N/A;   EYE SURGERY Bilateral    cataracts   Past Medical History:  Diagnosis Date   Arthritis    Cataract    left cataract removed in 2017   Diabetes mellitus  Diabetic neuropathy (HCC)    Diabetic neuropathy, type II diabetes mellitus ([place])    Hepatitis C    took tx for    Hypertension    Hypokalemia    Knee pain    right    Morbidly obese ([place])    Nocturnal seizures ([place]) 05/14/2015   no bad seizure since 1980's   OSA on CPAP    uses cpap 2-3 week   Plantar fasciitis    both feet   BP 135/78   Pulse 80   Temp 98.1 F (36.7 C) (Oral)   Ht 5\' 4"  (1.626 m)   Wt 232 lb (105.2 kg)   SpO2 93%   BMI 39.82 kg/m   Opioid Risk Score:   Fall Risk Score:  `1  Depression screen PHQ 2/9  Depression screen [facility] 2/9 09/13/2021 04/10/2020 02/23/2018 10/20/2016 02/01/2016 01/30/2016  Decreased Interest 2 0 1 0 0 0  Down, Depressed, Hopeless 0 0 0 0 0 0  PHQ - 2  Score 2 0 1 0 0 0  Altered sleeping 0 - - - - -  Tired, decreased energy 2 - - - - -  Change in appetite 2 - - - - -  Feeling bad or failure about yourself  0 - - - - -  Trouble concentrating 0 - - - - -  Moving slowly or fidgety/restless 0 - - - - -  Suicidal thoughts 0 - - - - -  PHQ-9 Score 6 - - - - -  Difficult doing work/chores Not difficult at all - - - - -  Some recent data might be hidden    Review of Systems  Musculoskeletal:  Positive for back pain.       Foot pain  Neurological:  Positive for tremors and weakness.  Psychiatric/Behavioral:  The patient is nervous/anxious.   All other systems reviewed and are negative.     Objective:   Physical Exam  Gen: no distress, normal appearing HEENT: oral mucosa pink and moist, NCAT Cardio: Reg rate Chest: normal effort, normal rate of breathing Abd: soft, non-distended Ext: no edema Psych: pleasant, normal affect Skin: intact Neuro: alert and oriented x3. Sensation intact in both feet    Assessment & Plan:  1) Chronic Pain Syndrome secondary to diabetic peripheral neuropathy -Discussed current symptoms of pain and history of pain.  -Discussed benefits of exercise in reducing pain. -Discussed following foods that may reduce pain: 1) Ginger (especially studied for arthritis)- reduce leukotriene production to decrease inflammation 2) Blueberries- high in phytonutrients that decrease inflammation 3) Salmon- marine omega-3s reduce joint swelling and pain 4) Pumpkin seeds- reduce inflammation 5) dark chocolate- reduces inflammation 6) turmeric- reduces inflammation 7) tart cherries - reduce pain and stiffness 8) extra virgin olive oil - its compound olecanthal helps to block prostaglandins  9) chili peppers- can be eaten or applied topically via capsaicin 10) mint- helpful for headache, muscle aches, joint pain, and itching 11) garlic- reduces inflammation  Link to further information on diet for chronic pain:  http://www.randall.com/   -Discussed Qutenza as an option for neuropathic pain control. Discussed that this is a capsaicin patch, stronger than capsaicin cream. Discussed that it is currently approved for diabetic peripheral neuropathy and post-herpetic neuralgia, but that it has also shown benefit in treating other forms of neuropathy. Provided patient with link to site to learn more about the patch: CinemaBon[employer].fr. Discussed that the patch would be placed in office and benefits usually last 3 months. Discussed  that unintended exposure to capsaicin can cause severe irritation of eyes, mucous membranes, respiratory tract, and skin, but that Qutenza is a local treatment and does not have the systemic side effects of other nerve medications. Discussed that there may be pain, itching, erythema, and decreased sensory function associated with the application of Qutenza. Side effects usually subside within 1 week. A cold pack of analgesic medications can help with these side effects. Blood pressure can also be increased due to pain associated with administration of the patch.   4 patches of Qutenza was applied to the area of pain. Ice packs were applied during the procedure to ensure patient comfort. Blood pressure was monitored every 15 minutes. The patient tolerated the procedure well. Post-procedure instructions were given and follow-up has been scheduled.    2) Gout -continue colchicine -provided extensive dietary counseling.

## 2021-09-13 NOTE — Patient Instructions (Signed)
Diabetes: -try to incorporate into your diet some of the following foods which are good for diabetes: 1) cinnamon- imitates effects of insulin, increasing glucose transport into cells ([place] or [place] cinnamon is best, least processed) 2) nuts- can slow down the blood sugar response of carbohydrate rich foods 3) oatmeal- contains and anti-inflammatory compound avenanthramide 4) whole-milk yogurt (best types are no sugar, [place] yogurt, or goat/sheep yogurt) 5) beans- high in protein, fiber, and vitamins, low glycemic index 6) broccoli- great source of vitamin A and C 7) quinoa- higher in protein and fiber than other grains 8) spinach- high in vitamin A, fiber, and protein 9) olive oil- reduces glucose levels, LDL, and triglycerides 10) salmon- excellent amount of omega-3-fatty acids 11) walnuts- rich in antioxidants 12) apples- high in fiber and quercetin 13) carrots- highly nutritious with low impact on blood sugar 14) eggs- improve HDL (good cholesterol), high in protein, keep you satiated 15) turmeric: improves blood sugars, cardiovascular disease, and protects kidney health 16) garlic: improves blood sugar, blood pressure, pain 17) tomatoes: highly nutritious with low impact on blood sugar

## 2021-09-16 DIAGNOSIS — E119 Type 2 diabetes mellitus without complications: Secondary | ICD-10-CM | POA: Diagnosis not present

## 2021-09-16 DIAGNOSIS — H5203 Hypermetropia, bilateral: Secondary | ICD-10-CM | POA: Diagnosis not present

## 2021-09-25 ENCOUNTER — Ambulatory Visit: Payer: HMO | Admitting: Orthopaedic Surgery

## 2021-09-25 ENCOUNTER — Other Ambulatory Visit: Payer: Self-pay

## 2021-09-25 ENCOUNTER — Ambulatory Visit (INDEPENDENT_AMBULATORY_CARE_PROVIDER_SITE_OTHER): Payer: HMO

## 2021-09-25 ENCOUNTER — Encounter: Payer: Self-pay | Admitting: Orthopaedic Surgery

## 2021-09-25 DIAGNOSIS — M5441 Lumbago with sciatica, right side: Secondary | ICD-10-CM

## 2021-09-25 DIAGNOSIS — M5442 Lumbago with sciatica, left side: Secondary | ICD-10-CM

## 2021-09-25 DIAGNOSIS — G8929 Other chronic pain: Secondary | ICD-10-CM | POA: Diagnosis not present

## 2021-09-25 DIAGNOSIS — M25552 Pain in left hip: Secondary | ICD-10-CM

## 2021-09-25 NOTE — Progress Notes (Signed)
Office Visit Note   Patient: Cathy Gonzalez           Date of Birth: [birth year]           MRN: [id number] Visit Date: 09/25/2021              Requested by: [name], [address] [place],  [place] [id number] PCP: [name], MD   Assessment & Plan: Visit Diagnoses:  1. Pain in left hip   2. Chronic bilateral low back pain with bilateral sciatica     Plan: Mrs.Lomax is experiencing recurrent current low back pain predominantly on the left paralumbar region associated with some left groin pain.  She is not had any injury or trauma.  Her past history is significant and that she had similar pain in 2018 with films of her lumbar spine and an MRI of her lumbar spine revealing diffuse degenerative changes, foraminal and central stenosis.  She saw Dr. Ernestina Patches for an epidural steroid injection and notes that it made all the difference and really has not had much trouble until just recently.  Films today demonstrate diffuse degenerative changes throughout the lumbar spine particularly between L2 and L5 with straightening of the normal lordosis.  Pelvic films were negative for any obvious left hip changes.  I suspect the left groin pain may be referred from her back.  We will asked Dr. Ernestina Patches to evaluate her for consideration of an epidural steroid injection and then plan to see her back shortly thereafter.  We could always consider repeat MRI scan of her lumbar spine if no improvement.  She has lost about 30 pounds  Follow-Up Instructions: Return if symptoms worsen or fail to improve.   Orders:  Orders Placed This Encounter  Procedures   XR Lumbar Spine 2-3 Views   XR Pelvis 1-2 Views   No orders of the defined types were placed in this encounter.     Procedures: No procedures performed   Clinical Data: No additional findings.   Subjective: Chief Complaint  Patient presents with   Left Hip - Pain  Patient presents today for left hip pain. She said that it has been hurting  for two weeks. No known injury. Her pain is located in her buttock. It radiates into her groin and down her leg. She is taking Tylenol for pain.  Has had prior history of chronic low back pain.  Had films of her back in 2018 as well as an MRI scan demonstrating multiple levels of degenerative arthritis, foraminal and central stenosis.  She was evaluated by Dr. Ernestina Patches with an epidural steroid injection notes that she has been fine up until just recently.  Has not had any referred pain specifically into either lower extremity other than the left groin  HPI  Review of Systems   Objective: Vital Signs: There were no vitals taken for this visit.  Physical Exam Constitutional:      Appearance: She is well-developed.  Pulmonary:     Effort: Pulmonary effort is normal.  Skin:    General: Skin is warm and dry.  Neurological:     Mental Status: She is alert and oriented to person, place, and time.  Psychiatric:        Behavior: Behavior normal.    Ortho Exam awake and alert comfortable sitting.  Did not walk with a limp.  Straight leg raise is negative.  Slight decrease internal and external rotation of her right hip compared to her  left but little if any discomfort.  Free range of motion of her left hip where she has been experiencing pain without any discomfort.  Has diabetic neuropathy distally with tingling and burning.  Does have some mild percussible left paralumbar and sacral discomfort but no particular pain over the SI joint.  Motor exam intact  Specialty Comments:  No specialty comments available.  Imaging: No results found.   PMFS History: Patient Active Problem List   Diagnosis Date Noted   Abnormal liver function tests 02/12/2021   Diarrhea 02/12/2021   Gastro-esophageal reflux disease without esophagitis 71/24/5809   Periumbilical pain 98/33/8250   Personal history of colonic polyps 02/12/2021   Portal hypertension ([place]) 02/12/2021   Screening for malignant neoplasm of  colon 02/12/2021   Type 2 diabetes mellitus without complications ([place]) 53/97/6734   Primary osteoarthritis, left hand 11/07/2020   Arthritis of carpometacarpal ([facility]) joint of thumb 07/18/2020   Bilateral primary osteoarthritis of knee 06/20/2020   Bilateral primary osteoarthritis of hip 06/07/2020   Right thigh pain 05/09/2020   Low back pain 07/14/2019   OSA on CPAP 05/16/2019   Diabetic neuropathy associated with type 2 diabetes mellitus ([place]) 12/13/2018   Chronic pain of right knee 12/09/2018   Thumb pain, left 12/09/2018   Dizziness and giddiness 01/14/2016   Hepatic cirrhosis ([place]) 07/26/2015   Nocturnal seizures ([place]) 05/14/2015   Chronic hepatitis C without hepatic coma ([place]) 03/14/2015   Vertigo, central 02/11/2014   Difficulty walking 02/11/2014   Diabetes mellitus ([place]) 02/11/2014   Dysphagia 02/11/2014   OBESITY 07/26/2010   Essential hypertension, benign 07/26/2010   DEGENERATIVE JOINT DISEASE, GENERALIZED 07/26/2010   Past Medical History:  Diagnosis Date   Arthritis    Cataract    left cataract removed in 2017   Diabetes mellitus    Diabetic neuropathy (HCC)    Diabetic neuropathy, type II diabetes mellitus ([place])    Hepatitis C    took tx for    Hypertension    Hypokalemia    Knee pain    right    Morbidly obese (HCC)    Nocturnal seizures ([place]) 05/14/2015   no bad seizure since 1980's   OSA on CPAP    uses cpap 2-3 week   Plantar fasciitis    both feet    Family History  Problem Relation Age of Onset   Diabetes Mother    Heart attack Mother    Diabetes Sister    Diabetes Brother    Cancer Sister        breast   Breast cancer Sister    Seizures Neg Hx     Past Surgical History:  Procedure Laterality Date   ABDOMINAL HYSTERECTOMY     partial   CATARACT EXTRACTION Left    COLONOSCOPY WITH PROPOFOL N/A 08/21/2015   Procedure: COLONOSCOPY WITH PROPOFOL;  Surgeon: Carol Ada, MD;  [facility];  Service: Endoscopy;  Laterality: N/A;    ESOPHAGOGASTRODUODENOSCOPY (EGD) WITH PROPOFOL N/A 08/21/2015   Procedure: ESOPHAGOGASTRODUODENOSCOPY (EGD) WITH PROPOFOL;  Surgeon: Carol Ada, MD;  [facility];  Service: Endoscopy;  Laterality: N/A;   ESOPHAGOGASTRODUODENOSCOPY (EGD) WITH PROPOFOL N/A 10/08/2018   Procedure: ESOPHAGOGASTRODUODENOSCOPY (EGD) WITH PROPOFOL;  Surgeon: Carol Ada, MD;  [facility];  Service: Endoscopy;  Laterality: N/A;   EYE SURGERY Bilateral    cataracts   Social History   Occupational History   Occupation: [employer]  Tobacco Use   Smoking status: Never   Smokeless tobacco: Never  Vaping Use   Vaping Use: Never used  Substance and Sexual Activity   Alcohol use: No    Alcohol/week: 0.0 standard drinks   Drug use: No   Sexual activity: Never

## 2021-10-02 DIAGNOSIS — Z86010 Personal history of colonic polyps: Secondary | ICD-10-CM | POA: Diagnosis not present

## 2021-10-02 DIAGNOSIS — R0781 Pleurodynia: Secondary | ICD-10-CM | POA: Diagnosis not present

## 2021-10-03 ENCOUNTER — Encounter: Payer: HMO | Admitting: Physical Medicine and Rehabilitation

## 2021-10-03 ENCOUNTER — Other Ambulatory Visit: Payer: Self-pay

## 2021-10-03 ENCOUNTER — Encounter: Payer: Self-pay | Admitting: Physical Medicine and Rehabilitation

## 2021-10-03 VITALS — BP 129/79 | HR 80 | Temp 99.0°F | Ht 64.0 in | Wt 240.0 lb

## 2021-10-03 DIAGNOSIS — M1A3790 Chronic gout due to renal impairment, unspecified ankle and foot, without tophus (tophi): Secondary | ICD-10-CM | POA: Diagnosis not present

## 2021-10-03 DIAGNOSIS — E1142 Type 2 diabetes mellitus with diabetic polyneuropathy: Secondary | ICD-10-CM | POA: Diagnosis not present

## 2021-10-03 NOTE — Progress Notes (Signed)
Subjective:    Patient ID: Cathy Gonzalez, female    DOB: 1946-09-06, 75 y.o.   MRN: [id number]  HPI Cathy Gonzalez is a 75 year old woman who presents to establish care for diabetic peripheral neuropathy and gout.  1) Diabetic peripheral neuropathy -she takes Lyrica -eases the pain a little bit -has been present for 4 years -she does not eat salt, pasta -Her CBGs are usually 97-108. They went up last week since she was on steroids.  -pain is not severe today, but it is at times -she is ready to try Qutenza today -she felt pain relief immediately after application and tolerated the procedure well -she will have HgbA1c checked soon and she feels it will be better as she has been trying to eat healthier.   2) Gout -she takes colchicine -has been present for 4 years -she does not eat salt, pasta -she eats a steak every now and then, mostly be eats baked [place].   3) Dry skin - she asks what she can do for this  4) Bilateral lower extremity edema -she asks whether Qutenza will help for this as well  Pain Inventory Average Pain 5 Pain Right Now 0 My pain is intermittent, burning, and tingling  In the last 24 hours, has pain interfered with the following? General activity 0 Relation with others 0 Enjoyment of life 0 What TIME of day is your pain at its worst? night Sleep (in general) Good  Pain is worse with: walking and some activites Pain improves with: medication Relief from Meds: 5  ability to climb steps?  yes do you drive?  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  weakness numbness tremor tingling spasms anxiety  New pt  New pt    Family History  Problem Relation Age of Onset   Diabetes Mother    Heart attack Mother    Diabetes Sister    Diabetes Brother    Cancer Sister        breast   Breast cancer Sister    Seizures Neg Hx    Social History   Socioeconomic History   Marital status: Single    Spouse name: Not on  file   Number of children: 4   Years of education: 12   Highest education level: Not on file  Occupational History   Occupation: [employer]  Tobacco Use   Smoking status: Never   Smokeless tobacco: Never  Vaping Use   Vaping Use: Never used  Substance and Sexual Activity   Alcohol use: No    Alcohol/week: 0.0 standard drinks   Drug use: No   Sexual activity: Never  Other Topics Concern   Not on file  Social History Narrative   Patient occasionally drinks caffeine.   Patient is right handed.   Social Determinants of Health   Financial Resource Strain: Not on file  Food Insecurity: Not on file  Transportation Needs: Not on file  Physical Activity: Not on file  Stress: Not on file  Social Connections: Not on file   Past Surgical History:  Procedure Laterality Date   ABDOMINAL HYSTERECTOMY     partial   CATARACT EXTRACTION Left    COLONOSCOPY WITH PROPOFOL N/A 08/21/2015   Procedure: COLONOSCOPY WITH PROPOFOL;  Surgeon: Carol Ada, MD;  [facility];  Service: Endoscopy;  Laterality: N/A;   ESOPHAGOGASTRODUODENOSCOPY (EGD) WITH PROPOFOL N/A 08/21/2015   Procedure: ESOPHAGOGASTRODUODENOSCOPY (EGD) WITH PROPOFOL;  Surgeon: Carol Ada,  MD;  [facility];  Service: Endoscopy;  Laterality: N/A;   ESOPHAGOGASTRODUODENOSCOPY (EGD) WITH PROPOFOL N/A 10/08/2018   Procedure: ESOPHAGOGASTRODUODENOSCOPY (EGD) WITH PROPOFOL;  Surgeon: Carol Ada, MD;  [facility];  Service: Endoscopy;  Laterality: N/A;   EYE SURGERY Bilateral    cataracts   Past Medical History:  Diagnosis Date   Arthritis    Cataract    left cataract removed in 2017   Diabetes mellitus    Diabetic neuropathy ([place])    Diabetic neuropathy, type II diabetes mellitus ([place])    Hepatitis C    took tx for    Hypertension    Hypokalemia    Knee pain    right    Morbidly obese (HCC)    Nocturnal seizures ([place]) 05/14/2015   no bad seizure since 1980's   OSA on CPAP    uses cpap  2-3 week   Plantar fasciitis    both feet   BP 129/79    Pulse 80    Temp 99 F (37.2 C)    Ht 5\' 4"  (1.626 m)    Wt 240 lb (108.9 kg)    SpO2 96%    BMI 41.20 kg/m   Opioid Risk Score:   Fall Risk Score:  `1  Depression screen PHQ 2/9  Depression screen [facility] 2/9 10/03/2021 09/13/2021 04/10/2020 02/23/2018 10/20/2016  Decreased Interest 1 2 0 1 0  Down, Depressed, Hopeless 1 0 0 0 0  PHQ - 2 Score 2 2 0 1 0  Altered sleeping - 0 - - -  Tired, decreased energy - 2 - - -  Change in appetite - 2 - - -  Feeling bad or failure about yourself  - 0 - - -  Trouble concentrating - 0 - - -  Moving slowly or fidgety/restless - 0 - - -  Suicidal thoughts - 0 - - -  PHQ-9 Score - 6 - - -  Difficult doing work/chores - Not difficult at all - - -  Some recent data might be hidden    Review of Systems  Musculoskeletal:  Positive for back pain.       Foot pain  Neurological:  Positive for tremors and weakness.  Psychiatric/Behavioral:  The patient is nervous/anxious.   All other systems reviewed and are negative.     Objective:   Physical Exam Gen: no distress, normal appearing, weight 240 lbs, BMI 41.20 HEENT: oral mucosa pink and moist, NCAT Cardio: Reg rate Chest: normal effort, normal rate of breathing Abd: soft, non-distended Ext: bilateral lower extremity edema Psych: pleasant, normal affect Skin: dry Neuro: alert and oriented x3. Sensation intact in both feet    Assessment & Plan:  1) Chronic Pain Syndrome secondary to diabetic peripheral neuropathy -Discussed current symptoms of pain and history of pain.  -Discussed benefits of exercise in reducing pain. -Discussed following foods that may reduce pain: 1) Ginger (especially studied for arthritis)- reduce leukotriene production to decrease inflammation 2) Blueberries- high in phytonutrients that decrease inflammation 3) Salmon- marine omega-3s reduce joint swelling and pain 4) Pumpkin seeds- reduce inflammation 5) dark  chocolate- reduces inflammation 6) turmeric- reduces inflammation 7) tart cherries - reduce pain and stiffness 8) extra virgin olive oil - its compound olecanthal helps to block prostaglandins  9) chili peppers- can be eaten or applied topically via capsaicin 10) mint- helpful for headache, muscle aches, joint pain, and itching 11) garlic- reduces inflammation  Link to further information on diet for chronic pain:  http://www.randall.com/   -Discussed Qutenza as an option for neuropathic pain control. Discussed that this is a capsaicin patch, stronger than capsaicin cream. Discussed that it is currently approved for diabetic peripheral neuropathy and post-herpetic neuralgia, but that it has also shown benefit in treating other forms of neuropathy. Provided patient with link to site to learn more about the patch: CinemaBon[employer]fr. Discussed that the patch would be placed in office and benefits usually last 3 months. Discussed that unintended exposure to capsaicin can cause severe irritation of eyes, mucous membranes, respiratory tract, and skin, but that Qutenza is a local treatment and does not have the systemic side effects of other nerve medications. Discussed that there may be pain, itching, erythema, and decreased sensory function associated with the application of Qutenza. Side effects usually subside within 1 week. A cold pack of analgesic medications can help with these side effects. Blood pressure can also be increased due to pain associated with administration of the patch.   4 patches of Qutenza was applied to the area of pain. Ice packs were applied during the procedure to ensure patient comfort. Blood pressure was monitored every 15 minutes. The patient tolerated the procedure well. Post-procedure instructions were given and follow-up has been scheduled.    2) Gout -continue colchicine -provided extensive dietary  counseling.  -Provided with a pain relief journal and discussed that it contains foods and lifestyle tips to naturally help to improve pain. Discussed that these lifestyle strategies are also very good for health unlike some medications which can have negative side effects. Discussed that the act of keeping a journal can be therapeutic and helpful to realize patterns what helps to trigger and alleviate pain.

## 2021-10-04 DIAGNOSIS — E785 Hyperlipidemia, unspecified: Secondary | ICD-10-CM | POA: Diagnosis not present

## 2021-10-04 DIAGNOSIS — I10 Essential (primary) hypertension: Secondary | ICD-10-CM | POA: Diagnosis not present

## 2021-10-04 DIAGNOSIS — E118 Type 2 diabetes mellitus with unspecified complications: Secondary | ICD-10-CM | POA: Diagnosis not present

## 2021-10-14 DIAGNOSIS — E1169 Type 2 diabetes mellitus with other specified complication: Secondary | ICD-10-CM | POA: Diagnosis not present

## 2021-10-14 DIAGNOSIS — G629 Polyneuropathy, unspecified: Secondary | ICD-10-CM | POA: Diagnosis not present

## 2021-10-14 DIAGNOSIS — G40001 Localization-related (focal) (partial) idiopathic epilepsy and epileptic syndromes with seizures of localized onset, not intractable, with status epilepticus: Secondary | ICD-10-CM | POA: Diagnosis not present

## 2021-10-14 DIAGNOSIS — R600 Localized edema: Secondary | ICD-10-CM | POA: Diagnosis not present

## 2021-10-14 DIAGNOSIS — F410 Panic disorder [episodic paroxysmal anxiety] without agoraphobia: Secondary | ICD-10-CM | POA: Diagnosis not present

## 2021-10-14 DIAGNOSIS — E785 Hyperlipidemia, unspecified: Secondary | ICD-10-CM | POA: Diagnosis not present

## 2021-10-17 DIAGNOSIS — G4733 Obstructive sleep apnea (adult) (pediatric): Secondary | ICD-10-CM | POA: Diagnosis not present

## 2021-10-21 ENCOUNTER — Other Ambulatory Visit: Payer: Self-pay

## 2021-10-21 ENCOUNTER — Encounter: Payer: Self-pay | Admitting: Podiatry

## 2021-10-21 ENCOUNTER — Other Ambulatory Visit: Payer: Self-pay | Admitting: Podiatry

## 2021-10-21 ENCOUNTER — Ambulatory Visit: Payer: HMO | Admitting: Podiatry

## 2021-10-21 ENCOUNTER — Ambulatory Visit (INDEPENDENT_AMBULATORY_CARE_PROVIDER_SITE_OTHER): Payer: HMO

## 2021-10-21 DIAGNOSIS — M722 Plantar fascial fibromatosis: Secondary | ICD-10-CM

## 2021-10-21 DIAGNOSIS — E1149 Type 2 diabetes mellitus with other diabetic neurological complication: Secondary | ICD-10-CM

## 2021-10-21 DIAGNOSIS — M7661 Achilles tendinitis, right leg: Secondary | ICD-10-CM

## 2021-10-21 DIAGNOSIS — G4733 Obstructive sleep apnea (adult) (pediatric): Secondary | ICD-10-CM | POA: Diagnosis not present

## 2021-10-21 DIAGNOSIS — E1140 Type 2 diabetes mellitus with diabetic neuropathy, unspecified: Secondary | ICD-10-CM

## 2021-10-21 DIAGNOSIS — M5416 Radiculopathy, lumbar region: Secondary | ICD-10-CM

## 2021-10-21 NOTE — Progress Notes (Signed)
°  Subjective:  Patient ID: Cathy Gonzalez, female    DOB: 06-05-1946,   MRN: [id number]  Chief Complaint  Patient presents with   Foot Pain    Pt reports right heel pain that comes and goes, worse at night. Rates pain 5/10.    76 y.o. female presents for concern of heel pain and heel spur of right heel . Relates she has been getting pain for about a month off and on in her right heel. Mostly at night. Relates the pain runs down her leg and on the outside of her heel down to her toe. Is diabetic and has neuropathy.  Also has a history of back problems. Is currently on Lyrica which helps with the neuropathy pain. Denies any other pedal complaints. Denies n/v/f/c.   Past Medical History:  Diagnosis Date   Arthritis    Cataract    left cataract removed in 2017   Diabetes mellitus    Diabetic neuropathy ([place])    Diabetic neuropathy, type II diabetes mellitus ([place])    Hepatitis C    took tx for    Hypertension    Hypokalemia    Knee pain    right    Morbidly obese ([place])    Nocturnal seizures ([place]) 05/14/2015   no bad seizure since 1980's   OSA on CPAP    uses cpap 2-3 week   Plantar fasciitis    both feet    Objective:  Physical Exam: Vascular: DP/PT pulses 2/4 bilateral. CFT <3 seconds. Normal hair growth on digits. No edema.  Skin. No lacerations or abrasions bilateral feet.  Musculoskeletal: MMT 5/5 bilateral lower extremities in DF, PF, Inversion and Eversion. Deceased ROM in DF of ankle joint.  Tender to achilles insertion mildly. No pain with PF/DF. No other pain to palpation.  Neurological: Sensation intact to light touch. Protective sensation diminished.   Assessment:   1. Tendonitis, Achilles, right   2. Diabetic neuropathy with neurologic complication ([place])   3. Lumbar radiculopathy      Plan:  Patient was evaluated and treated and all questions answered. -Xrays reviewed -Discussed radiculopathy and back pain as well Achilles insertional tendonitis and treatment  options with patient.  -Will follow-up with specialists for back pain and is supposed to be getting a steroid injection in the hip soon that may help as well.  -Discussed stretching exercises.  -Heel lifts provided and discussed proper shoewear.  -Discussed if no improvement will consider MRI/PT/EPAT/PRP injections.  -Patient to return to office as needed or sooner if condition worsens.     Lorenda Peck, DPM

## 2021-10-21 NOTE — Patient Instructions (Signed)

## 2021-10-29 ENCOUNTER — Ambulatory Visit: Payer: HMO | Admitting: Physical Medicine and Rehabilitation

## 2021-10-29 ENCOUNTER — Other Ambulatory Visit: Payer: Self-pay

## 2021-10-29 ENCOUNTER — Ambulatory Visit: Payer: HMO | Admitting: Podiatry

## 2021-10-29 ENCOUNTER — Encounter: Payer: Self-pay | Admitting: Physical Medicine and Rehabilitation

## 2021-10-29 ENCOUNTER — Ambulatory Visit: Payer: Self-pay

## 2021-10-29 VITALS — BP 144/75 | HR 87

## 2021-10-29 DIAGNOSIS — M5416 Radiculopathy, lumbar region: Secondary | ICD-10-CM

## 2021-10-29 MED ORDER — METHYLPREDNISOLONE ACETATE 80 MG/ML IJ SUSP
80.0000 mg | Freq: Once | INTRAMUSCULAR | Status: AC
  Administered 2021-10-29: 13:00:00 80 mg

## 2021-10-29 NOTE — Progress Notes (Signed)
LUCIENNE Gonzalez - 76 y.o. female MRN [id number]  Date of birth: June 11, 1946  Office Visit Note: Visit Date: 10/29/2021 PCP: [name], MD Referred by: [name], MD  Subjective: Chief Complaint  Patient presents with   Lower Back - Pain   Right Leg - Pain   HPI:  Cathy Gonzalez is a 76 y.o. female who comes in today at the request of Dr. Joni Fears for planned Left L4-5 Lumbar Interlaminar epidural steroid injection with fluoroscopic guidance.  The patient has failed conservative care including home exercise, medications, time and activity modification.  This injection will be diagnostic and hopefully therapeutic.  Please see requesting physician notes for further details and justification.   ROS Otherwise per HPI.  Assessment & Plan: Visit Diagnoses:    ICD-10-CM   1. Lumbar radiculopathy  M54.16 XR C-ARM NO REPORT    Epidural Steroid injection    methylPREDNISolone acetate (DEPO-MEDROL) injection 80 mg      Plan: No additional findings.   Meds & Orders:  Meds ordered this encounter  Medications   methylPREDNISolone acetate (DEPO-MEDROL) injection 80 mg    Orders Placed This Encounter  Procedures   XR C-ARM NO REPORT   Epidural Steroid injection    Follow-up: Return if symptoms worsen or fail to improve.   Procedures: No procedures performed      Clinical History: Lumbar spine MRI  12/28/2016  L3-L4: Central and rightward protrusion. Foraminal disc material extends into the extraforaminal compartment on the RIGHT. Posterior element hypertrophy. Moderate stenosis. RIGHT L3 and BILATERAL L4 nerve root impingement.   L4-L5: Central protrusion. Advanced posterior element hypertrophy. Moderate to severe stenosis. Foraminal and extraforaminal disc material to the LEFT. LEFT greater than RIGHT L4 and L5 nerve root impingement.   L5-S1: Central extrusion. Posterior element hypertrophy. Disc material extends into both foramina. Mild stenosis. BILATERAL L5  and S1 nerve root impingement.   Compared with 2012, there is worsening at all levels except L1-2, which appears improved since that time.   IMPRESSION: Advanced multilevel spondylosis, most pronounced from L2 through S1. Combination of disc herniations and posterior element hypertrophy results in potentially symptomatic neural impingement at L2-3, L3-4, L4-5, and L5-S1.       Objective:  VS:  HT:     WT:    BMI:      BP:(!) 144/75   HR:87bpm   TEMP: ( )   RESP:  Physical Exam Vitals and nursing note reviewed.  Constitutional:      General: She is not in acute distress.    Appearance: Normal appearance. She is not ill-appearing.  HENT:     Head: Normocephalic and atraumatic.     Right Ear: External ear normal.     Left Ear: External ear normal.  Eyes:     Extraocular Movements: Extraocular movements intact.  Cardiovascular:     Rate and Rhythm: Normal rate.     Pulses: Normal pulses.  Pulmonary:     Effort: Pulmonary effort is normal. No respiratory distress.  Abdominal:     General: There is no distension.     Palpations: Abdomen is soft.  Musculoskeletal:        General: Tenderness present.     Cervical back: Neck supple.     Right lower leg: No edema.     Left lower leg: No edema.     Comments: Patient has good distal strength with no pain over the greater trochanters.  No clonus or focal weakness.  Skin:    Findings: No erythema, lesion or rash.  Neurological:     General: No focal deficit present.     Mental Status: She is alert and oriented to person, place, and time.     Sensory: No sensory deficit.     Motor: No weakness or abnormal muscle tone.     Coordination: Coordination normal.  Psychiatric:        Mood and Affect: Mood normal.        Behavior: Behavior normal.     Imaging: No results found.

## 2021-10-29 NOTE — Progress Notes (Signed)
Pt state lower back pain that travels down her right leg. Pt state walking and standing makes the pain worse. Pt state she takes over the counter pain meds and pain cream.  Numeric Pain Rating Scale and Functional Assessment Average Pain 2   In the last MONTH (on 0-10 scale) has pain interfered with the following?  1. General activity like being  able to carry out your everyday physical activities such as walking, climbing stairs, carrying groceries, or moving a chair?  Rating(9)   +Driver, -BT, -Dye Allergies.

## 2021-10-29 NOTE — Patient Instructions (Signed)

## 2021-11-03 DIAGNOSIS — E118 Type 2 diabetes mellitus with unspecified complications: Secondary | ICD-10-CM | POA: Diagnosis not present

## 2021-11-03 DIAGNOSIS — I10 Essential (primary) hypertension: Secondary | ICD-10-CM | POA: Diagnosis not present

## 2021-11-03 DIAGNOSIS — E785 Hyperlipidemia, unspecified: Secondary | ICD-10-CM | POA: Diagnosis not present

## 2021-11-07 DIAGNOSIS — M130 Polyarthritis, unspecified: Secondary | ICD-10-CM | POA: Diagnosis not present

## 2021-11-07 DIAGNOSIS — E1165 Type 2 diabetes mellitus with hyperglycemia: Secondary | ICD-10-CM | POA: Diagnosis not present

## 2021-11-07 DIAGNOSIS — I10 Essential (primary) hypertension: Secondary | ICD-10-CM | POA: Diagnosis not present

## 2021-11-07 DIAGNOSIS — E785 Hyperlipidemia, unspecified: Secondary | ICD-10-CM | POA: Diagnosis not present

## 2021-11-08 ENCOUNTER — Ambulatory Visit (INDEPENDENT_AMBULATORY_CARE_PROVIDER_SITE_OTHER): Payer: HMO

## 2021-11-08 ENCOUNTER — Ambulatory Visit (INDEPENDENT_AMBULATORY_CARE_PROVIDER_SITE_OTHER): Payer: HMO | Admitting: Physician Assistant

## 2021-11-08 ENCOUNTER — Other Ambulatory Visit: Payer: Self-pay

## 2021-11-08 DIAGNOSIS — M25511 Pain in right shoulder: Secondary | ICD-10-CM

## 2021-11-08 DIAGNOSIS — M542 Cervicalgia: Secondary | ICD-10-CM | POA: Diagnosis not present

## 2021-11-08 MED ORDER — METHYLPREDNISOLONE 4 MG PO TBPK: ORAL_TABLET | ORAL | 0 refills | Status: DC

## 2021-11-08 NOTE — Progress Notes (Signed)
Office Visit Note   Patient: Cathy Gonzalez           Date of Birth: January 29, 1946           MRN: [id number] Visit Date: 11/08/2021              Requested by: [name], MD [address] [place],  [place] [address] PCP: [name], MD  Chief Complaint  Patient presents with   Right Shoulder - Pain      HPI: Is a right-hand-dominant 76 year old woman with a chief complaint of neck and shoulder pain.  She states she fell out of her bed a couple weeks ago.  At first she just felt kind of stiff but noticed progression of pain.  She thinks her shoulder is more painful however she does talk about some stiffness and pain in her neck.  She is particularly concerned about some paresthesias running down into her hand.  She denies any frank weakness.  She does not think this is getting better.  Assessment & Plan: Visit Diagnoses:  1. Neck pain, acute   2. Acute pain of right shoulder     Plan: Patient with 2-week history of neck pain with radiation and tingling down into her right hand as well as right shoulder pain.  She does have arthritic changes in her cervical spine on x-ray.  No acute changes on her shoulder x-ray.  Certainly she has components of both shoulder and cervical pain with radicular symptoms.  She is not weak.  She is more concerned with the tingling that runs down her hand from her neck.  She does also have a history of diabetic neuropathy.  I will try her on a short course of prednisone.  She understands to discontinue this if it causes stomach upset not to take any other anti-inflammatories.  She said she has tolerated prednisone in the past.  She also knows it may affect her blood sugar.  She has seen Dr. Durward Fortes and we will follow-up with him in 3 weeks.  Certainly if her shoulder is then more isolated and symptomatic we could consider an injection.  If her neck is still symptomatic with radicular symptoms we could consider physical therapy or ultimately an  MRI  Follow-Up Instructions: No follow-ups on file.   Ortho Exam  Patient is alert, oriented, no adenopathy, well-dressed, normal affect, normal respiratory effort. Examination.  Patient is well appropriate to exam normal mood good respiratory effort Neck: She can flex her neck forward without any pain flexing it backwards does reproduce pain in her neck.  Turning it to the left is nonpainful turning it to the right does reproduce painful symptoms.  Her shoulder she does have forward elevation equivalent to the other side she can go behind her back.  Strength is overall good.  Distal hand has good grip strength.  Cannot reproduce paresthesias today.  She has minimal impingement findings today.  Imaging: XR Shoulder Right  Result Date: 11/08/2021 Radiographs of the right shoulder were reviewed today.  She does have some arthritis of the [facility] joint with some hypertrophic superior bone.  The humeral head is well placed in the glenoid with overall well-preserved congruent joint space no acute osseous injuries are noted  No images are attached to the encounter.  Labs: Lab Results  Component Value Date   HGBA1C 7.2 (H) 02/11/2014     Lab Results  Component Value Date   ALBUMIN 4.6 10/20/2016   ALBUMIN 4.5  08/11/2016   ALBUMIN 3.8 07/12/2015    No results found for: MG No results found for: VD25OH  No results found for: PREALBUMIN CBC EXTENDED Latest Ref Rng & Units 02/16/2017 08/11/2016 07/12/2015  WBC 4.0 - 10.5 K/uL 7.3 6.7 3.8(L)  RBC 3.87 - 5.11 MIL/uL 4.70 4.15 4.04  HGB 12.0 - 15.0 g/dL 13.9 12.1 11.9(L)  HCT 36.0 - 46.0 % 41.7 35.9 35.0(L)  PLT 150 - 400 K/uL 230 283 237  NEUTROABS 1.4 - 7.0 x10E3/uL - 4.0 1.5(L)  LYMPHSABS 0.7 - 3.1 x10E3/uL - 2.0 1.6     There is no height or weight on file to calculate BMI.  Orders:  Orders Placed This Encounter  Procedures   XR Shoulder Right   XR Cervical Spine 2 or 3 views   No orders of the defined types were placed in this  encounter.    Procedures: No procedures performed  Clinical Data: No additional findings.  ROS:  All other systems negative, except as noted in the HPI. Review of Systems  Objective: Vital Signs: There were no vitals taken for this visit.  Specialty Comments:  No specialty comments available.  PMFS History: Patient Active Problem List   Diagnosis Date Noted   Abnormal liver function tests 02/12/2021   Diarrhea 02/12/2021   Gastro-esophageal reflux disease without esophagitis 50/06/3817   Periumbilical pain 29/93/7169   Personal history of colonic polyps 02/12/2021   Portal hypertension ([place]) 02/12/2021   Screening for malignant neoplasm of colon 02/12/2021   Type 2 diabetes mellitus without complications ([place]) 67/89/3810   Primary osteoarthritis, left hand 11/07/2020   Arthritis of carpometacarpal ([facility]) joint of thumb 07/18/2020   Bilateral primary osteoarthritis of knee 06/20/2020   Bilateral primary osteoarthritis of hip 06/07/2020   Right thigh pain 05/09/2020   Low back pain 07/14/2019   OSA on CPAP 05/16/2019   Diabetic neuropathy associated with type 2 diabetes mellitus ([place]) 12/13/2018   Chronic pain of right knee 12/09/2018   Thumb pain, left 12/09/2018   Dizziness and giddiness 01/14/2016   Hepatic cirrhosis ([place]) 07/26/2015   Nocturnal seizures ([place]) 05/14/2015   Chronic hepatitis C without hepatic coma ([place]) 03/14/2015   Vertigo, central 02/11/2014   Difficulty walking 02/11/2014   Diabetes mellitus ([place]) 02/11/2014   Dysphagia 02/11/2014   OBESITY 07/26/2010   Essential hypertension, benign 07/26/2010   DEGENERATIVE JOINT DISEASE, GENERALIZED 07/26/2010   Past Medical History:  Diagnosis Date   Arthritis    Cataract    left cataract removed in 2017   Diabetes mellitus    Diabetic neuropathy ([facility])    Diabetic neuropathy, type II diabetes mellitus ([place])    Hepatitis C    took tx for    Hypertension    Hypokalemia    Knee pain    right     Morbidly obese ([facility])    Nocturnal seizures ([place]) 05/14/2015   no bad seizure since 1980's   OSA on CPAP    uses cpap 2-3 week   Plantar fasciitis    both feet    Family History  Problem Relation Age of Onset   Diabetes Mother    Heart attack Mother    Diabetes Sister    Diabetes Brother    Cancer Sister        breast   Breast cancer Sister    Seizures Neg Hx     Past Surgical History:  Procedure Laterality Date   ABDOMINAL HYSTERECTOMY     partial  CATARACT EXTRACTION Left    COLONOSCOPY WITH PROPOFOL N/A 08/21/2015   Procedure: COLONOSCOPY WITH PROPOFOL;  Surgeon: Carol Ada, MD;  Location: [facility];  Service: Endoscopy;  Laterality: N/A;   ESOPHAGOGASTRODUODENOSCOPY (EGD) WITH PROPOFOL N/A 08/21/2015   Procedure: ESOPHAGOGASTRODUODENOSCOPY (EGD) WITH PROPOFOL;  Surgeon: Carol Ada, MD;  Location: [facility];  Service: Endoscopy;  Laterality: N/A;   ESOPHAGOGASTRODUODENOSCOPY (EGD) WITH PROPOFOL N/A 10/08/2018   Procedure: ESOPHAGOGASTRODUODENOSCOPY (EGD) WITH PROPOFOL;  Surgeon: Carol Ada, MD;  [facility]: [facility];  Service: Endoscopy;  Laterality: N/A;   EYE SURGERY Bilateral    cataracts   Social History   Occupational History   Occupation: [employer]  Tobacco Use   Smoking status: Never   Smokeless tobacco: Never  Vaping Use   Vaping Use: Never used  Substance and Sexual Activity   Alcohol use: No    Alcohol/week: 0.0 standard drinks   Drug use: No   Sexual activity: Never

## 2021-11-14 DIAGNOSIS — G64 Other disorders of peripheral nervous system: Secondary | ICD-10-CM | POA: Diagnosis not present

## 2021-11-14 DIAGNOSIS — E1169 Type 2 diabetes mellitus with other specified complication: Secondary | ICD-10-CM | POA: Diagnosis not present

## 2021-12-03 ENCOUNTER — Other Ambulatory Visit: Payer: Self-pay

## 2021-12-03 ENCOUNTER — Encounter: Payer: Self-pay | Admitting: Orthopaedic Surgery

## 2021-12-03 ENCOUNTER — Ambulatory Visit: Payer: HMO | Admitting: Orthopaedic Surgery

## 2021-12-03 DIAGNOSIS — E785 Hyperlipidemia, unspecified: Secondary | ICD-10-CM | POA: Diagnosis not present

## 2021-12-03 DIAGNOSIS — M542 Cervicalgia: Secondary | ICD-10-CM | POA: Diagnosis not present

## 2021-12-03 DIAGNOSIS — I10 Essential (primary) hypertension: Secondary | ICD-10-CM | POA: Diagnosis not present

## 2021-12-03 DIAGNOSIS — E118 Type 2 diabetes mellitus with unspecified complications: Secondary | ICD-10-CM | POA: Diagnosis not present

## 2021-12-03 NOTE — Progress Notes (Signed)
Office Visit Note   Patient: Cathy Gonzalez           Date of Birth: Jun 08, 1946           MRN: [id number] Visit Date: 12/03/2021              Requested by: [name], MD [address] [place],  [place] [address] PCP: [name], MD   Assessment & Plan: Visit Diagnoses: Neck pain and paresthesias  Plan: Patient is a very pleasant 76 year old woman follows up for her right neck and shoulder pain.  She was placed on a Medrol Dosepak she said this does not provide her for any relief.  Today she focuses more on her neck.  When she extends her neck it sends a shooting sensation down into her hand.  It does resolve.  She does have x-rays which demonstrate some arthritis in her cervical spine.  Her neck exam is otherwise benign with the exception of the reproducible symptoms with extension.  Findings do not indicate a vascular cause for a thoracic outlet syndrome she is diabetic however her pattern of pain goes from the shoulder down.  We would recommend an MRI of the cervical spine to evaluate this if she has failed conservative treatment.  If this were negative we could consider electrodiagnostic studies to evaluate for carpal tunnel given her history of diabetes  Follow-Up Instructions: No follow-ups on file.   Orders:  No orders of the defined types were placed in this encounter.  No orders of the defined types were placed in this encounter.     Procedures: No procedures performed   Clinical Data: No additional findings.   Subjective: Chief Complaint  Patient presents with   Right Shoulder - Pain, Follow-up  Patient presents today for a three week follow up on her right shoulder. She said that it has continued to worsen. She took a medrol dose pak and it did not help. She has pain down her right arm. She states that it tingles.    Review of Systems  Constitutional: Negative.     Objective: Vital Signs: There were no vitals taken for this visit.  Physical  Exam Constitutional:      Appearance: Normal appearance.  Skin:    General: Skin is warm and dry.  Neurological:     General: No focal deficit present.     Mental Status: She is alert.  Psychiatric:        Mood and Affect: Mood normal.        Behavior: Behavior normal.    Ortho Exam Examination of her neck she has reproduction of her symptoms of the tingling shooting down her right arm with extension of her neck.  No problems with flexion and turning side to side.  She has good grip strength.  Examination of the shoulder no impingement findings good range of motion good strength.  Vascular exam does not find anything consistent with a thoracic outlet syndrome.  No palpable deformities of her spine. Specialty Comments:  No specialty comments available.  Imaging: No results found.   PMFS History: Patient Active Problem List   Diagnosis Date Noted   Abnormal liver function tests 02/12/2021   Diarrhea 02/12/2021   Gastro-esophageal reflux disease without esophagitis 35/45/6256   Periumbilical pain 38/93/7342   Personal history of colonic polyps 02/12/2021   Portal hypertension ([place]) 02/12/2021   Screening for malignant neoplasm of colon 02/12/2021   Type 2 diabetes mellitus without complications ([place])  02/12/2021   Primary osteoarthritis, left hand 11/07/2020   Arthritis of carpometacarpal [facility]) joint of thumb 07/18/2020   Bilateral primary osteoarthritis of knee 06/20/2020   Bilateral primary osteoarthritis of hip 06/07/2020   Right thigh pain 05/09/2020   Low back pain 07/14/2019   OSA on CPAP 05/16/2019   Diabetic neuropathy associated with type 2 diabetes mellitus ([place]) 12/13/2018   Chronic pain of right knee 12/09/2018   Thumb pain, left 12/09/2018   Dizziness and giddiness 01/14/2016   Hepatic cirrhosis ([place]) 07/26/2015   Nocturnal seizures ([place]) 05/14/2015   Chronic hepatitis C without hepatic coma ([place]) 03/14/2015   Vertigo, central 02/11/2014   Difficulty walking  02/11/2014   Diabetes mellitus ([place]) 02/11/2014   Dysphagia 02/11/2014   OBESITY 07/26/2010   Essential hypertension, benign 07/26/2010   DEGENERATIVE JOINT DISEASE, GENERALIZED 07/26/2010   Past Medical History:  Diagnosis Date   Arthritis    Cataract    left cataract removed in 2017   Diabetes mellitus    Diabetic neuropathy ([facility])    Diabetic neuropathy, type II diabetes mellitus ([place])    Hepatitis C    took tx for    Hypertension    Hypokalemia    Knee pain    right    Morbidly obese ([facility])    Nocturnal seizures ([place]) 05/14/2015   no bad seizure since 1980's   OSA on CPAP    uses cpap 2-3 week   Plantar fasciitis    both feet    Family History  Problem Relation Age of Onset   Diabetes Mother    Heart attack Mother    Diabetes Sister    Diabetes Brother    Cancer Sister        breast   Breast cancer Sister    Seizures Neg Hx     Past Surgical History:  Procedure Laterality Date   ABDOMINAL HYSTERECTOMY     partial   CATARACT EXTRACTION Left    COLONOSCOPY WITH PROPOFOL N/A 08/21/2015   Procedure: COLONOSCOPY WITH PROPOFOL;  Surgeon: Carol Ada, MD;  [facility];  Service: Endoscopy;  Laterality: N/A;   ESOPHAGOGASTRODUODENOSCOPY (EGD) WITH PROPOFOL N/A 08/21/2015   Procedure: ESOPHAGOGASTRODUODENOSCOPY (EGD) WITH PROPOFOL;  Surgeon: Carol Ada, MD;  [facility];  Service: Endoscopy;  Laterality: N/A;   ESOPHAGOGASTRODUODENOSCOPY (EGD) WITH PROPOFOL N/A 10/08/2018   Procedure: ESOPHAGOGASTRODUODENOSCOPY (EGD) WITH PROPOFOL;  Surgeon: Carol Ada, MD;  [facility];  Service: Endoscopy;  Laterality: N/A;   EYE SURGERY Bilateral    cataracts   Social History   Occupational History   Occupation: [employer]  Tobacco Use   Smoking status: Never   Smokeless tobacco: Never  Vaping Use   Vaping Use: Never used  Substance and Sexual Activity   Alcohol use: No    Alcohol/week: 0.0 standard drinks   Drug use: No   Sexual  activity: Never

## 2021-12-06 DIAGNOSIS — M79606 Pain in leg, unspecified: Secondary | ICD-10-CM | POA: Diagnosis not present

## 2021-12-06 DIAGNOSIS — M79651 Pain in right thigh: Secondary | ICD-10-CM | POA: Diagnosis not present

## 2021-12-06 DIAGNOSIS — M79673 Pain in unspecified foot: Secondary | ICD-10-CM | POA: Diagnosis not present

## 2021-12-06 DIAGNOSIS — M25559 Pain in unspecified hip: Secondary | ICD-10-CM | POA: Diagnosis not present

## 2021-12-06 DIAGNOSIS — N390 Urinary tract infection, site not specified: Secondary | ICD-10-CM | POA: Diagnosis not present

## 2021-12-06 DIAGNOSIS — Z6839 Body mass index (BMI) 39.0-39.9, adult: Secondary | ICD-10-CM | POA: Diagnosis not present

## 2021-12-16 ENCOUNTER — Other Ambulatory Visit: Payer: Self-pay

## 2021-12-16 ENCOUNTER — Encounter: Payer: Self-pay | Admitting: Podiatrist

## 2021-12-16 ENCOUNTER — Ambulatory Visit: Payer: HMO | Admitting: Podiatry

## 2021-12-16 ENCOUNTER — Ambulatory Visit (INDEPENDENT_AMBULATORY_CARE_PROVIDER_SITE_OTHER): Payer: PPO | Admitting: Podiatrist

## 2021-12-16 DIAGNOSIS — M778 Other enthesopathies, not elsewhere classified: Secondary | ICD-10-CM

## 2021-12-16 DIAGNOSIS — M7662 Achilles tendinitis, left leg: Secondary | ICD-10-CM

## 2021-12-16 DIAGNOSIS — E1142 Type 2 diabetes mellitus with diabetic polyneuropathy: Secondary | ICD-10-CM | POA: Diagnosis not present

## 2021-12-16 DIAGNOSIS — M7661 Achilles tendinitis, right leg: Secondary | ICD-10-CM

## 2021-12-16 MED ORDER — TRIAMCINOLONE ACETONIDE 10 MG/ML IJ SUSP
10.0000 mg | Freq: Once | INTRAMUSCULAR | Status: AC
Start: 2021-12-16 — End: 2021-12-20
  Administered 2021-12-20: 10 mg

## 2021-12-16 NOTE — Patient Instructions (Signed)
I am ordering physical therapy for you-  they will call you to set up your appontment.   ?

## 2021-12-20 DIAGNOSIS — M7661 Achilles tendinitis, right leg: Secondary | ICD-10-CM | POA: Diagnosis not present

## 2021-12-20 DIAGNOSIS — M7662 Achilles tendinitis, left leg: Secondary | ICD-10-CM | POA: Diagnosis not present

## 2021-12-20 DIAGNOSIS — E1142 Type 2 diabetes mellitus with diabetic polyneuropathy: Secondary | ICD-10-CM | POA: Diagnosis not present

## 2021-12-20 DIAGNOSIS — M778 Other enthesopathies, not elsewhere classified: Secondary | ICD-10-CM | POA: Diagnosis not present

## 2021-12-20 NOTE — Progress Notes (Signed)
?Chief Complaint  ?Patient presents with  ? Foot Pain  ?  ? ?HPI: Patient is 76 y.o. female who presents today for pain in bilateral feet and in bilateral lower legs.  She states that she has a burning pain under her right big toe and pain between her first and second metatarsals as she points to this area.  She also still has pain in her calf musculature and down her Achilles tendons.  She states that she does have a diagnosis of neuropathy and she is seeing a back specialist for back pains.  She is also diabetic ? ?Patient Active Problem List  ? Diagnosis Date Noted  ? Neck pain 12/03/2021  ? Abnormal liver function tests 02/12/2021  ? Diarrhea 02/12/2021  ? Gastro-esophageal reflux disease without esophagitis 02/12/2021  ? Periumbilical pain 81/82/9937  ? Personal history of colonic polyps 02/12/2021  ? Portal hypertension ([place]) 02/12/2021  ? Screening for malignant neoplasm of colon 02/12/2021  ? Type 2 diabetes mellitus without complications ([place]) 16/96/7893  ? Primary osteoarthritis, left hand 11/07/2020  ? Arthritis of carpometacarpal [facility]) joint of thumb 07/18/2020  ? Bilateral primary osteoarthritis of knee 06/20/2020  ? Bilateral primary osteoarthritis of hip 06/07/2020  ? Right thigh pain 05/09/2020  ? Low back pain 07/14/2019  ? OSA on CPAP 05/16/2019  ? Diabetic neuropathy associated with type 2 diabetes mellitus ([place]) 12/13/2018  ? Chronic pain of right knee 12/09/2018  ? Thumb pain, left 12/09/2018  ? Dizziness and giddiness 01/14/2016  ? Hepatic cirrhosis ([place]) 07/26/2015  ? Nocturnal seizures ([place]) 05/14/2015  ? Chronic hepatitis C without hepatic coma ([place]) 03/14/2015  ? Vertigo, central 02/11/2014  ? Difficulty walking 02/11/2014  ? Diabetes mellitus ([place]) 02/11/2014  ? Dysphagia 02/11/2014  ? OBESITY 07/26/2010  ? Essential hypertension, benign 07/26/2010  ? DEGENERATIVE JOINT DISEASE, GENERALIZED 07/26/2010  ? ? ?Current Outpatient Medications on File Prior to Visit  ?Medication Sig Dispense  Refill  ? acetaminophen (TYLENOL) 500 MG tablet Take 500 mg by mouth every 6 (six) hours as needed for moderate pain.    ? amLODipine (NORVASC) 10 MG tablet Take 10 mg by mouth daily. Takes half    ? Blood Glucose Monitoring Suppl (ONE TOUCH ULTRA 2) w/Device KIT 2 (two) times daily. as directed    ? colchicine 0.6 MG tablet Take 0.6 mg by mouth as needed (gout).     ? diclofenac Sodium (VOLTAREN) 1 % GEL SMARTSIG:2 Inch(es) Topical 4 Times Daily    ? empagliflozin (JARDIANCE) 10 MG TABS tablet Take 1 tablet (10 mg total) by mouth daily before breakfast. 90 tablet 3  ? ezetimibe (ZETIA) 10 MG tablet Take 10 mg by mouth at bedtime.     ? furosemide (LASIX) 40 MG tablet Take 40 mg by mouth daily.     ? levETIRAcetam (KEPPRA) 750 MG tablet Take 2 tablets (1,500 mg total) by mouth 2 (two) times daily. 360 tablet 4  ? metFORMIN (GLUCOPHAGE) 500 MG tablet Take 500 mg by mouth at bedtime.     ? methylPREDNISolone (MEDROL DOSEPAK) 4 MG TBPK tablet Take as directed 21 tablet 0  ? montelukast (SINGULAIR) 10 MG tablet Take 10 mg by mouth at bedtime.    ? ONE TOUCH ULTRA TEST test strip   5  ? ONETOUCH DELICA LANCETS 81O MISC   3  ? Potassium Chloride ER 20 MEQ TBCR Take 1 tablet by mouth 2 (two) times daily.  6  ? pregabalin (LYRICA) 300 MG capsule Take 1  capsule (300 mg total) by mouth 2 (two) times daily. 60 capsule 0  ? sucralfate (CARAFATE) 1 G tablet Take 1 g by mouth 2 (two) times daily.     ? telmisartan (MICARDIS) 80 MG tablet Take 80 mg by mouth daily.   1  ? ?No current facility-administered medications on file prior to visit.  ? ? ?Allergies  ?Allergen Reactions  ? Acetaminophen   ?  Other reaction(s): GI upset  ? Pregabalin   ?  Other reaction(s): diarrhea  ? ? ?Review of Systems ?No fevers, chills, nausea, muscle aches, no difficulty breathing, no calf pain, no chest pain or shortness of breath. ? ? ?Physical Exam ? ?GENERAL APPEARANCE: Alert, conversant. Appropriately groomed. No acute distress.  ? ?VASCULAR:  Pedal pulses palpable DP and PT bilateral.  Capillary refill time is immediate to all digits,  Proximal to distal cooling it warm to warm.  Digital perfusion adequate.  ? ?NEUROLOGIC: sensation is i decreased to 5.07 monofilament at 2/5 sites bilateral.  Light touch is intact bilateral, vibratory sensation absent bilateral ? ?MUSCULOSKELETAL: acceptable muscle strength, tone and stability bilateral.  Pes planus foot deformity noted.  Limitation of range of motion at the ankle joint with knee extended and knee flexed is noted.  Pain in the Achilles with dorsiflexion of bilateral ankles is noted.  Discomfort on palpation at the first metatarsal phalangeal joint with dorsiflexion and plantarflexion is also noted. ?Generalized pain between the first and second metatarsals is seen bilateral. ? ?DERMATOLOGIC: skin is warm, supple, and dry.  No open lesions noted.  No rash, no pre ulcerative lesions. Digital nails are asymptomatic.   ? ? ? ?Assessment  ? ?  ICD-10-CM   ?1. Diabetic peripheral neuropathy associated with type 2 diabetes mellitus ([place])  E11.42   ?  ?2. Capsulitis of foot, unspecified laterality  M77.8 triamcinolone acetonide (KENALOG) 10 MG/ML injection 10 mg  ?  ?3. Tendonitis, Achilles, right  M76.61   ?  ?4. Tendonitis, Achilles, left  M76.62   ?  ? ? ? ?Plan ? ?Discussed exam findings as well as treatment options and alternatives with the patient.  The patient states in the past injection therapy has been beneficial.  And I agreed to try injections in the first interspace to see if this would be beneficial.  The patient would like to try the injections and understands the risks and benefits.  I cleaned the skin with alcohol and infiltrated 10 mg of Kenalog with Marcaine plain in the first interspace bilaterally.  The patient tolerated this well.  Also recommended physical therapy for her Achilles tendinitis.  I [facility]  She will try the physical therapy  and will contact us if she is continuing to have pain after 4 to 6 weeks. ?

## 2021-12-23 ENCOUNTER — Ambulatory Visit
Admission: RE | Admit: 2021-12-23 | Discharge: 2021-12-23 | Disposition: A | Discharge status: Home / Self Care | Payer: HMO | Source: Ambulatory Visit | Attending: Internal Medicine | Admitting: Internal Medicine

## 2021-12-23 ENCOUNTER — Other Ambulatory Visit: Payer: Self-pay

## 2021-12-23 ENCOUNTER — Telehealth: Payer: Self-pay

## 2021-12-23 ENCOUNTER — Ambulatory Visit
Admission: RE | Admit: 2021-12-23 | Discharge: 2021-12-23 | Disposition: A | Discharge status: Home / Self Care | Payer: HMO | Source: Ambulatory Visit | Attending: Orthopaedic Surgery | Admitting: Orthopaedic Surgery

## 2021-12-23 DIAGNOSIS — M542 Cervicalgia: Secondary | ICD-10-CM

## 2021-12-23 DIAGNOSIS — K7460 Unspecified cirrhosis of liver: Secondary | ICD-10-CM

## 2021-12-23 DIAGNOSIS — M50222 Other cervical disc displacement at C5-C6 level: Secondary | ICD-10-CM | POA: Diagnosis not present

## 2021-12-23 DIAGNOSIS — M4802 Spinal stenosis, cervical region: Secondary | ICD-10-CM | POA: Diagnosis not present

## 2021-12-23 NOTE — Telephone Encounter (Signed)
-----   Message from Thayer Headings, MD sent at 12/23/2021  4:24 PM EDT ----- ?Please let her know her [place] looks fine, no new issues.  ?----- Message ----- ?From: Interface, Rad Results In ?Sent: 12/23/2021   2:00 PM EDT ?To: Thayer Headings, MD ? ? ?

## 2021-12-23 NOTE — Progress Notes (Signed)
?Cardiology Office Note:   ?Date:  12/26/2021  ?NAME:  Cathy Gonzalez    ?MRN: [id number] ?DOB:  [birth year]  ? ?PCP:  [name], MD  ?Cardiologist:  None  ?Electrophysiologist:  None  ? ?Referring MD: [name], MD  ? ?Chief Complaint  ?Patient presents with  ? Follow-up  ?   ?  ? ?History of Present Illness:   ?Cathy Gonzalez is a 76 y.o. female with a hx of PACs, DM, HTN, HLD who presents for follow-up.  She reports she is doing well.  Denies any chest pain or trouble breathing.  Suffering from several orthopedic issues.  Has compression of her cervical spine.  Seeing orthopedics for this.  Continues to report leg pain.  Recent ABIs are normal.  No vascular reason for this.  No palpitations.  History of PACs.  She is on Zetia.  She is diabetic.  Most recent LDL 127.  She is intolerant of statins.  We discussed other medications but she would like to forego this at this time.  Blood pressure seems to be fairly controlled.  140/82.  Overall doing well.  A1c 6.6.  Seems to be doing well. ? ?Problem List ?1. Obesity ?2. OSA ?3. Diabetes ?-A1c 6.5 ?4. HTN ?5. HLD ?-Total cholesterol 192, LDL 122, HDL 52, triglycerides 81 ?-statin intolerant  ?6. PACs ?-<1% ?-EF 60-65%, G1DD ? ?Past Medical History: ?Past Medical History:  ?Diagnosis Date  ? Arthritis   ? Cataract   ? left cataract removed in 2017  ? Diabetes mellitus   ? Diabetic neuropathy ([place])   ? Diabetic neuropathy, type II diabetes mellitus ([place])   ? Hepatitis C   ? took tx for   ? Hypertension   ? Hypokalemia   ? Knee pain   ? right   ? Morbidly obese ([place])   ? Nocturnal seizures ([place]) 05/14/2015  ? no bad seizure since 1980's  ? OSA on CPAP   ? uses cpap 2-3 week  ? Plantar fasciitis   ? both feet  ? ? ?Past Surgical History: ?Past Surgical History:  ?Procedure Laterality Date  ? ABDOMINAL HYSTERECTOMY    ? partial  ? CATARACT EXTRACTION Left   ? COLONOSCOPY WITH PROPOFOL N/A 08/21/2015  ? Procedure: COLONOSCOPY WITH PROPOFOL;  Surgeon: Carol Ada, MD;   [facility];  Service: Endoscopy;  Laterality: N/A;  ? ESOPHAGOGASTRODUODENOSCOPY (EGD) WITH PROPOFOL N/A 08/21/2015  ? Procedure: ESOPHAGOGASTRODUODENOSCOPY (EGD) WITH PROPOFOL;  Surgeon: Carol Ada, MD;  [facility];  Service: Endoscopy;  Laterality: N/A;  ? ESOPHAGOGASTRODUODENOSCOPY (EGD) WITH PROPOFOL N/A 10/08/2018  ? Procedure: ESOPHAGOGASTRODUODENOSCOPY (EGD) WITH PROPOFOL;  Surgeon: Carol Ada, MD;  [facility];  Service: Endoscopy;  Laterality: N/A;  ? EYE SURGERY Bilateral   ? cataracts  ? ? ?Current Medications: ?Current Meds  ?Medication Sig  ? acetaminophen (TYLENOL) 500 MG tablet Take 500 mg by mouth every 6 (six) hours as needed for moderate pain.  ? amLODipine (NORVASC) 10 MG tablet Take 10 mg by mouth daily. Takes half  ? Blood Glucose Monitoring Suppl (ONE TOUCH ULTRA 2) w/Device KIT 2 (two) times daily. as directed  ? colchicine 0.6 MG tablet Take 0.6 mg by mouth as needed (gout).   ? diclofenac Sodium (VOLTAREN) 1 % GEL SMARTSIG:2 Inch(es) Topical 4 Times Daily  ? empagliflozin (JARDIANCE) 10 MG TABS tablet Take 1 tablet (10 mg total) by mouth daily before breakfast.  ? ezetimibe (ZETIA) 10 MG tablet Take 10 mg by mouth  at bedtime.   ? furosemide (LASIX) 40 MG tablet Take 40 mg by mouth daily.   ? levETIRAcetam (KEPPRA) 750 MG tablet Take 2 tablets (1,500 mg total) by mouth 2 (two) times daily.  ? metFORMIN (GLUCOPHAGE) 500 MG tablet Take 500 mg by mouth at bedtime.   ? montelukast (SINGULAIR) 10 MG tablet Take 10 mg by mouth at bedtime.  ? ONE TOUCH ULTRA TEST test strip   ? ONETOUCH DELICA LANCETS 37T MISC   ? Potassium Chloride ER 20 MEQ TBCR Take 1 tablet by mouth 2 (two) times daily.  ? pregabalin (LYRICA) 300 MG capsule Take 1 capsule (300 mg total) by mouth 2 (two) times daily.  ? sucralfate (CARAFATE) 1 G tablet Take 1 g by mouth 2 (two) times daily.   ? telmisartan (MICARDIS) 80 MG tablet Take 80 mg by mouth daily.   ?  ? ?Allergies:    ?Acetaminophen  and Pregabalin  ? ?Social History: ?Social History  ? ?Socioeconomic History  ? Marital status: Single  ?  Spouse name: Not on file  ? Number of children: 4  ? Years of education: 20  ? Highest education level: Not on file  ?Occupational History  ? Occupation: [name]  ?Tobacco Use  ? Smoking status: Never  ? Smokeless tobacco: Never  ?Vaping Use  ? Vaping Use: Never used  ?Substance and Sexual Activity  ? Alcohol use: No  ?  Alcohol/week: 0.0 standard drinks  ? Drug use: No  ? Sexual activity: Never  ?Other Topics Concern  ? Not on file  ?Social History Narrative  ? Patient occasionally drinks caffeine.  ? Patient is right handed.  ? ?Social Determinants of Health  ? ?Financial Resource Strain: Not on file  ?Food Insecurity: Not on file  ?Transportation Needs: Not on file  ?Physical Activity: Not on file  ?Stress: Not on file  ?Social Connections: Not on file  ?  ? ?Family History: ?The patient's family history includes Breast cancer in her sister; Cancer in her sister; Diabetes in her brother, mother, and sister; Heart attack in her mother. There is no history of Seizures. ? ?ROS:   ?All other ROS reviewed and negative. Pertinent positives noted in the HPI.    ? ?EKGs/Labs/Other Studies Reviewed:   ?The following studies were personally reviewed by me today: ? ?Recent Labs: ?06/20/2021: ALT 36; BUN 22; Creat 1.24; Potassium 3.4; Sodium 141  ? ?Recent Lipid Panel ?   ?Component Value Date/Time  ? CHOL 216 (H) 02/11/2014 0650  ? TRIG 92 02/11/2014 0650  ? HDL 65 02/11/2014 0650  ? CHOLHDL 3.3 02/11/2014 0650  ? VLDL 18 02/11/2014 0650  ? [place] 133 (H) 02/11/2014 0650  ? ? ?Physical Exam:   ?VS:  BP 140/82   Pulse 81   Ht _0  (1.626 m)   Wt 234 lb 6.4 oz (106.3 kg)   SpO2 94%   BMI 40.23 kg/m?    ?Wt Readings from Last 3 Encounters:  ?12/26/21 234 lb 6.4 oz (106.3 kg)  ?10/03/21 240 lb (108.9 kg)  ?09/13/21 232 lb (105.2 kg)  ?  ?General: Well nourished, well developed, in no acute distress ?Head:  Atraumatic, normal size  ?Eyes: PEERLA, EOMI  ?Neck: Supple, no JVD ?Endocrine: No thryomegaly ?Cardiac: Normal S1, S2; RRR; no murmurs, rubs, or gallops ?Lungs: Clear to auscultation bilaterally, no wheezing, rhonchi or rales  ?Abd: Soft, nontender, no hepatomegaly  ?Ext: No edema, pulses 2+ ?Musculoskeletal: No deformities, BUE and BLE  strength normal and equal ?Skin: Warm and dry, no rashes   ?Neuro: Alert and oriented to person, place, time, and situation, CNII-XII grossly intact, no focal deficits  ?Psych: Normal mood and affect  ? ?ASSESSMENT:   ?Cathy Gonzalez is a 76 y.o. female who presents for the following: ?1. Palpitations   ?2. PAC (premature atrial contraction)   ?3. Mixed hyperlipidemia   ?4. Primary hypertension   ? ?PLAN:   ?1. Palpitations ?2. PAC (premature atrial contraction) ?-History of PACs.  No further palpitations.  Echo is normal. We will continue to monitor.  ? ?3. Mixed hyperlipidemia ?-Diabetic.  Statin intolerant.  Continue Zetia.  Can consider PCSK9 inhibitor in the future.  Right now she is not wanting to do this.  Goal LDL is less than 70.  Should continue to work on her diabetes as well as diet. ? ?4. Primary hypertension ?-Continue current medications.  No change. ? ?Disposition: Return if symptoms worsen or fail to improve. ? ?Medication Adjustments/Labs and Tests Ordered: ?Current medicines are reviewed at length with the patient today.  Concerns regarding medicines are outlined above.  ?No orders of the defined types were placed in this encounter. ? ?No orders of the defined types were placed in this encounter. ? ? ?Patient Instructions  ?Medication Instructions:  ?The current medical regimen is effective;  continue present plan and medications. ? ?*If you need a refill on your cardiac medications before your next appointment, please call your pharmacy* ? ? ?Follow-Up: ?At [facility], you and your health needs are our priority.  As part of our continuing mission to provide you  with exceptional heart care, we have created designated Provider Care Teams.  These Care Teams include your primary Cardiologist (physician) and Advanced Practice Providers (APPs -  Physician Assistants and Nurse Pract

## 2021-12-24 DIAGNOSIS — M25672 Stiffness of left ankle, not elsewhere classified: Secondary | ICD-10-CM | POA: Diagnosis not present

## 2021-12-24 DIAGNOSIS — M25572 Pain in left ankle and joints of left foot: Secondary | ICD-10-CM | POA: Diagnosis not present

## 2021-12-24 DIAGNOSIS — M25571 Pain in right ankle and joints of right foot: Secondary | ICD-10-CM | POA: Diagnosis not present

## 2021-12-24 DIAGNOSIS — M25671 Stiffness of right ankle, not elsewhere classified: Secondary | ICD-10-CM | POA: Diagnosis not present

## 2021-12-25 ENCOUNTER — Telehealth: Payer: Self-pay | Admitting: Orthopaedic Surgery

## 2021-12-25 NOTE — Telephone Encounter (Signed)
Diane w/ [facility] called with call report of MRI cervical spine stating it is ready to review. Also has an addendum to the report as well. ?

## 2021-12-26 ENCOUNTER — Other Ambulatory Visit: Payer: Self-pay

## 2021-12-26 ENCOUNTER — Ambulatory Visit (INDEPENDENT_AMBULATORY_CARE_PROVIDER_SITE_OTHER): Payer: HMO | Admitting: Cardiovascular Disease

## 2021-12-26 ENCOUNTER — Encounter: Payer: Self-pay | Admitting: Cardiovascular Disease

## 2021-12-26 VITALS — BP 140/82 | HR 81 | Ht 64.0 in | Wt 234.4 lb

## 2021-12-26 DIAGNOSIS — I10 Essential (primary) hypertension: Secondary | ICD-10-CM | POA: Diagnosis not present

## 2021-12-26 DIAGNOSIS — E782 Mixed hyperlipidemia: Secondary | ICD-10-CM

## 2021-12-26 DIAGNOSIS — I491 Atrial premature depolarization: Secondary | ICD-10-CM

## 2021-12-26 DIAGNOSIS — R002 Palpitations: Secondary | ICD-10-CM

## 2021-12-26 DIAGNOSIS — M542 Cervicalgia: Secondary | ICD-10-CM

## 2021-12-26 NOTE — Patient Instructions (Signed)
Medication Instructions:  The current medical regimen is effective;  continue present plan and medications.  *If you need a refill on your cardiac medications before your next appointment, please call your pharmacy*    Follow-Up: At [facility], you and your health needs are our priority.  As part of our continuing mission to provide you with exceptional heart care, we have created designated Provider Care Teams.  These Care Teams include your primary Cardiologist (physician) and Advanced Practice Providers (APPs -  Physician Assistants and Nurse Practitioners) who all work together to provide you with the care you need, when you need it.  We recommend signing up for the patient portal called "MyChart".  Sign up information is provided on this After Visit Summary.  MyChart is used to connect with patients for Virtual Visits (Telemedicine).  Patients are able to view lab/test results, encounter notes, upcoming appointments, etc.  Non-urgent messages can be sent to your provider as well.   To learn more about what you can do with MyChart, go to [email or web address].    Your next appointment:   As needed  The format for your next appointment:   In Person  Provider:    O'Neal, MD      

## 2021-12-30 DIAGNOSIS — M25572 Pain in left ankle and joints of left foot: Secondary | ICD-10-CM | POA: Diagnosis not present

## 2021-12-30 DIAGNOSIS — M25571 Pain in right ankle and joints of right foot: Secondary | ICD-10-CM | POA: Diagnosis not present

## 2021-12-30 DIAGNOSIS — N390 Urinary tract infection, site not specified: Secondary | ICD-10-CM | POA: Diagnosis not present

## 2021-12-30 DIAGNOSIS — M25672 Stiffness of left ankle, not elsewhere classified: Secondary | ICD-10-CM | POA: Diagnosis not present

## 2021-12-30 DIAGNOSIS — M25671 Stiffness of right ankle, not elsewhere classified: Secondary | ICD-10-CM | POA: Diagnosis not present

## 2021-12-31 ENCOUNTER — Encounter: Payer: Self-pay | Admitting: Orthopaedic Surgery

## 2021-12-31 ENCOUNTER — Ambulatory Visit: Payer: HMO | Admitting: Orthopaedic Surgery

## 2021-12-31 ENCOUNTER — Ambulatory Visit (INDEPENDENT_AMBULATORY_CARE_PROVIDER_SITE_OTHER): Payer: HMO | Admitting: Orthopaedic Surgery

## 2021-12-31 ENCOUNTER — Other Ambulatory Visit: Payer: Self-pay

## 2021-12-31 DIAGNOSIS — M4802 Spinal stenosis, cervical region: Secondary | ICD-10-CM

## 2022-01-01 DIAGNOSIS — M25571 Pain in right ankle and joints of right foot: Secondary | ICD-10-CM | POA: Diagnosis not present

## 2022-01-01 DIAGNOSIS — M25572 Pain in left ankle and joints of left foot: Secondary | ICD-10-CM | POA: Diagnosis not present

## 2022-01-01 DIAGNOSIS — M25672 Stiffness of left ankle, not elsewhere classified: Secondary | ICD-10-CM | POA: Diagnosis not present

## 2022-01-01 DIAGNOSIS — M25671 Stiffness of right ankle, not elsewhere classified: Secondary | ICD-10-CM | POA: Diagnosis not present

## 2022-01-02 ENCOUNTER — Encounter: Payer: HMO | Attending: Physical Medicine and Rehabilitation | Admitting: Physical Medicine and Rehabilitation

## 2022-01-02 VITALS — BP 134/79 | HR 75 | Ht 64.0 in | Wt 234.0 lb

## 2022-01-02 DIAGNOSIS — E1142 Type 2 diabetes mellitus with diabetic polyneuropathy: Secondary | ICD-10-CM | POA: Diagnosis not present

## 2022-01-02 DIAGNOSIS — Z6841 Body Mass Index (BMI) 40.0 and over, adult: Secondary | ICD-10-CM | POA: Insufficient documentation

## 2022-01-02 NOTE — Progress Notes (Signed)
? ?Subjective:  ? ? Patient ID: Cathy Gonzalez, female    DOB: [birth year], 76 y.o.   MRN: [id number] ? ?HPI ?Cathy Gonzalez is a 76 year old woman who presents for follow-up of diabetic peripheral neuropathy and gout. ? ?1) Diabetic peripheral neuropathy ?-she takes Lyrica ?-eases the pain a little bit ?-has been present for 4 years ?-she does not eat salt, pasta ?-Her CBGs are usually 97-108. They went up last week since she was on steroids.  ?-pain is not severe today, but it is at times ?-Qutenza has helped a lot ?-it lasted for a long time ?-she felt pain relief immediately after application and tolerated the procedure well ?-she will have HgbA1c checked soon and she feels it will be better as she has been trying to eat healthier.  ?-she has been trying to eat more fruits and vegetables and does not eat sugar ? ?2) Gout ?-she takes colchicine ?-has been present for 4 years ?-she does not eat salt, pasta ?-she eats a steak every now and then, mostly be eats baked [place].  ? ?3) Dry skin ?- she asks what she can do for this ? ?4) Bilateral lower extremity edema ?-she asks whether Qutenza will help for this as well ? ?5) Cervical stenosis ?-she is scheduled for surgery in May ? ?6) Obesity ?-she lost three pounds! ? ?Pain Inventory ?Average Pain 5 ?Pain Right Now 0 ?My pain is intermittent, burning, and tingling ? ?In the last 24 hours, has pain interfered with the following? ?General activity 0 ?Relation with others 0 ?Enjoyment of life 0 ?What TIME of day is your pain at its worst? night ?Sleep (in general) Good ? ?Pain is worse with: walking and some activites ?Pain improves with: medication ?Relief from Meds: 5 ? ?ability to climb steps?  yes ?do you drive?  yes ? 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 ?weakness ?numbness ?tremor ?tingling ?spasms ?anxiety ? ?New pt ? ?New pt ? ? ? ?Family History  ?Problem Relation Age of Onset  ? Diabetes Mother   ? Heart attack Mother   ? Diabetes Sister   ?  Diabetes Brother   ? Cancer Sister   ?     breast  ? Breast cancer Sister   ? Seizures Neg Hx   ? ?Social History  ? ?Socioeconomic History  ? Marital status: Single  ?  Spouse name: Not on file  ? Number of children: 4  ? Years of education: 38  ? Highest education level: Not on file  ?Occupational History  ? Occupation: [name]  ?Tobacco Use  ? Smoking status: Never  ? Smokeless tobacco: Never  ?Vaping Use  ? Vaping Use: Never used  ?Substance and Sexual Activity  ? Alcohol use: No  ?  Alcohol/week: 0.0 standard drinks  ? Drug use: No  ? Sexual activity: Never  ?Other Topics Concern  ? Not on file  ?Social History Narrative  ? Patient occasionally drinks caffeine.  ? Patient is right handed.  ? ?Social Determinants of Health  ? ?Financial Resource Strain: Not on file  ?Food Insecurity: Not on file  ?Transportation Needs: Not on file  ?Physical Activity: Not on file  ?Stress: Not on file  ?Social Connections: Not on file  ? ?Past Surgical History:  ?Procedure Laterality Date  ? ABDOMINAL HYSTERECTOMY    ? partial  ? CATARACT EXTRACTION Left   ? COLONOSCOPY WITH PROPOFOL N/A 08/21/2015  ? Procedure: COLONOSCOPY  WITH PROPOFOL;  Surgeon: Carol Ada, MD;  [facility];  Service: Endoscopy;  Laterality: N/A;  ? ESOPHAGOGASTRODUODENOSCOPY (EGD) WITH PROPOFOL N/A 08/21/2015  ? Procedure: ESOPHAGOGASTRODUODENOSCOPY (EGD) WITH PROPOFOL;  Surgeon: Carol Ada, MD;  [facility];  Service: Endoscopy;  Laterality: N/A;  ? ESOPHAGOGASTRODUODENOSCOPY (EGD) WITH PROPOFOL N/A 10/08/2018  ? Procedure: ESOPHAGOGASTRODUODENOSCOPY (EGD) WITH PROPOFOL;  Surgeon: Carol Ada, MD;  [facility];  Service: Endoscopy;  Laterality: N/A;  ? EYE SURGERY Bilateral   ? cataracts  ? ?Past Medical History:  ?Diagnosis Date  ? Arthritis   ? Cataract   ? left cataract removed in 2017  ? Diabetes mellitus   ? Diabetic neuropathy ([place])   ? Diabetic neuropathy, type II diabetes mellitus ([place])   ? Hepatitis C   ? took  tx for   ? Hypertension   ? Hypokalemia   ? Knee pain   ? right   ? Morbidly obese ([place])   ? Nocturnal seizures ([place]) 05/14/2015  ? no bad seizure since 1980's  ? OSA on CPAP   ? uses cpap 2-3 week  ? Plantar fasciitis   ? both feet  ? ?BP 134/79   Pulse 75   Ht '5\' 4"'$  (1.626 m)   Wt 234 lb (106.1 kg)   SpO2 95%   BMI 40.17 kg/m?  ? ?Opioid Risk Score:   ?Fall Risk Score:  `1 ? ?Depression screen PHQ 2/9 ? ? ?  01/02/2022  ?  1:47 PM 10/03/2021  ? 10:36 AM 09/13/2021  ? 10:57 AM 04/10/2020  ?  2:42 PM 02/23/2018  ?  9:32 AM 10/20/2016  ?  3:13 PM 02/01/2016  ?  1:00 PM  ?Depression screen PHQ 2/9  ?Decreased Interest '1 1 2 '$ 0 1 0 0  ?Down, Depressed, Hopeless 1 1 0 0 0 0 0  ?PHQ - 2 Score '2 2 2 '$ 0 1 0 0  ?Altered sleeping   0      ?Tired, decreased energy   2      ?Change in appetite   2      ?Feeling bad or failure about yourself    0      ?Trouble concentrating   0      ?Moving slowly or fidgety/restless   0      ?Suicidal thoughts   0      ?PHQ-9 Score   6      ?Difficult doing work/chores   Not difficult at all      ?  ?Review of Systems  ?Musculoskeletal:  Positive for back pain.  ?     Foot pain  ?Neurological:  Positive for tremors and weakness.  ?Psychiatric/Behavioral:  The patient is nervous/anxious.   ?All other systems reviewed and are negative. ? ?   ?Objective:  ? Physical Exam ?Gen: no distress, normal appearing, weight 234 lbs, BMI 40.17 ?HEENT: oral mucosa pink and moist, NCAT ?Cardio: Reg rate ?Chest: normal effort, normal rate of breathing ?Abd: soft, non-distended ?Ext: bilateral lower extremity edema ?Psych: pleasant, normal affect ?Skin: dry, no open lesions ?Neuro: alert and oriented x3. Sensation intact in both feet ?   ?Assessment & Plan:  ?1) Chronic Pain Syndrome secondary to diabetic peripheral neuropathy ?-Discussed current symptoms of pain and history of pain.  ?-Discussed benefits of exercise in reducing pain. ?-Discussed following foods that may reduce pain: ?1) Ginger (especially studied  for arthritis)- reduce leukotriene production to decrease inflammation ?2) Blueberries- high in phytonutrients that decrease  inflammation ?3) Salmon- marine omega-3s reduce joint swelling and pain ?4) Pumpkin seeds- reduce inflammation ?5) dark chocolate- reduces inflammation ?6) turmeric- reduces inflammation ?7) tart cherries - reduce pain and stiffness ?8) extra virgin olive oil - its compound olecanthal helps to block prostaglandins  ?9) chili peppers- can be eaten or applied topically via capsaicin ?10) mint- helpful for headache, muscle aches, joint pain, and itching ?11) garlic- reduces inflammation ? ?Link to further information on diet for chronic pain: http://www.randall.com/  ? ?-Discussed Qutenza as an option for neuropathic pain control. Discussed that this is a capsaicin patch, stronger than capsaicin cream. Discussed that it is currently approved for diabetic peripheral neuropathy and post-herpetic neuralgia, but that it has also shown benefit in treating other forms of neuropathy. Provided patient with link to site to learn more about the patch: CinemaBonus.fr. Discussed that the patch would be placed in office and benefits usually last 3 months. Discussed that unintended exposure to capsaicin can cause severe irritation of eyes, mucous membranes, respiratory tract, and skin, but that Qutenza is a local treatment and does not have the systemic side effects of other nerve medications. Discussed that there may be pain, itching, erythema, and decreased sensory function associated with the application of Qutenza. Side effects usually subside within 1 week. A cold pack of analgesic medications can help with these side effects. Blood pressure can also be increased due to pain associated with administration of the patch.  ? ?4 patches of Qutenza was applied to the area of pain. Ice packs were applied during the procedure to ensure  patient comfort. Blood pressure was monitored every 15 minutes. The patient tolerated the procedure well. Post-procedure instructions were given and follow-up has been scheduled.     ? ?2) Gout ?-continu

## 2022-01-03 DIAGNOSIS — M4802 Spinal stenosis, cervical region: Secondary | ICD-10-CM | POA: Insufficient documentation

## 2022-01-03 DIAGNOSIS — E118 Type 2 diabetes mellitus with unspecified complications: Secondary | ICD-10-CM | POA: Diagnosis not present

## 2022-01-03 DIAGNOSIS — I10 Essential (primary) hypertension: Secondary | ICD-10-CM | POA: Diagnosis not present

## 2022-01-03 NOTE — Progress Notes (Addendum)
? ?Office Visit Note ?  ?Patient: Cathy Gonzalez           ?Date of Birth: Feb 25, 1946           ?MRN: [id number] ?Visit Date: 12/31/2021 ?             ?Requested by: Garald Balding, MD ?[address] ?[place],  [place] [address] ?PCP: [name], MD ? ? ?Assessment & Plan: ?Visit Diagnoses:  ?1. Spinal stenosis of cervical region   ? ? ?Plan: Patient has severe foraminal stenosis on the right at C4-5 moderate central stenosis and an area of cord edema at C4-5.  She has some mild to moderate changes at other levels including C2-3 and C6-7.  Symptoms are consistent with her severe foraminal stenosis and central stenosis at C4-5 and plan would be single level cervical fusion C4-5 with allograft and plate where she has the cord compression and either cord edema or early myelomalacia changes.  Questions were elicited and answered.  She understands request to proceed.  MRI scan and report were reviewed in detail. ? ?Follow-Up Instructions: No follow-ups on file.  ? ?Orders:  ?No orders of the defined types were placed in this encounter. ? ?No orders of the defined types were placed in this encounter. ? ? ? ? Procedures: ?No procedures performed ? ? ?Clinical Data: ?No additional findings. ? ? ?Subjective: ?Chief Complaint  ?Patient presents with  ? Right Arm - Pain  ? ? ?HPI 76 year old female returns with ongoing neck pain and right shoulder pain for several months progressive symptoms. .  Medrol Dosepak did not give her relief.  MRI scan has been obtained and is reviewed.  She is used Tylenol which eases it to some degree.  She states recently her right knee has been bothering her more she has been using a knee sleeve that helps.She feels like her leg will not hold her.  ?She has numbness in her hands more right than left that bothers her  at nights.  Patient has  type 2 diabetes portal hypertension obesity with sleep apnea.  Previous cervical spine radiographs show multilevel spondylitic changes with spurring and disc  space narrowing C4-C7. ?She has noted weakness in her right arm with tingling that wakes her up and night. Pain with neck extension and flexion that radiates to her sacrum. She states she cannot turn her neck because it shoots severe pain down her right arm .  ?Review of SystemsAll the systems noncontributory to HPI. ?Objective: ?Vital Signs: BP (!) 142/79   Pulse 77   Ht '5\' 4"'$  (1.626 m)   Wt 226 lb (102.5 kg)   BMI 38.79 kg/m?  ? ?Physical Exam ?Constitutional:   ?   Appearance: She is well-developed.  ?HENT:  ?   Head: Normocephalic.  ?   Right Ear: External ear normal.  ?   Left Ear: External ear normal. There is no impacted cerumen.  ?Eyes:  ?   Pupils: Pupils are equal, round, and reactive to light.  ?Neck:  ?   Thyroid: No thyromegaly.  ?   Trachea: No tracheal deviation.  ?Cardiovascular:  ?   Rate and Rhythm: Normal rate.  ?Pulmonary:  ?   Effort: Pulmonary effort is normal.  ?Abdominal:  ?   Palpations: Abdomen is soft.  ?Musculoskeletal:  ?   Cervical back: No rigidity.  ?Skin: ?   General: Skin is warm and dry.  ?Neurological:  ?   Mental Status: She is alert and oriented  to person, place, and time.  ?Psychiatric:     ?   Behavior: Behavior normal.  ? ? ?Ortho Exam patient has positive brachial plexus tenderness on the right increased pain with cervical compression no improvement with distraction.  Reflexes are symmetrical no lower extremity clonus.  Reflexes are symmetrical lower extremities.  No clonus.  Patient has pain with cervical extension reproducing her pain and her right arm.  Good grip strength.  Upper extremity pulses are normal. ? ?Specialty Comments:  ?No specialty comments available. ? ?Imaging: ?CLINICAL DATA:  Chronic neck and right shoulder pain for 2 months. ?  ?EXAM: ?MRI CERVICAL SPINE WITHOUT CONTRAST ?  ?TECHNIQUE: ?Multiplanar, multisequence MR imaging of the cervical spine was ?performed. No intravenous contrast was administered. ?  ?COMPARISON:  MR cervical 03/26/2006; X-ray  cervical 11/08/2021 ?(without report). ?  ?FINDINGS: ?Alignment: Straightening of the normal cervical lordosis. ?  ?Vertebrae: Vertebral body heights are maintained. No focal marrow ?edema to suggest acute fracture discitis/osteomyelitis. ?  ?Cord: Small focus of T2/stir hyperintensity in the left eccentric ?cord at C4-C5 where there is a disc protrusion (detailed below). ?  ?Posterior Fossa, vertebral arteries, paraspinal tissues: Visualized ?vertebral artery flow voids are maintained. No evidence of acute ?abnormality in the visualized posterior fossa. ?  ?Disc levels: ?  ?C2-C3: Posterior disc osteophyte complex with bilateral facet ?uncovertebral hypertrophy. Resulting mild to moderate canal stenosis ?and mild to moderate right and mild left foraminal stenosis. Disc ?contacts and flattens the ventral cord. ?  ?C3-C4: Posterior disc osteophyte complex with left greater than ?right facet and uncovertebral hypertrophy. Resulting mild canal ?stenosis with moderate left and mild-to-moderate right foraminal ?stenosis. ?  ?C4-C5: Posterior disc osteophyte complex with bilateral facet ?uncovertebral hypertrophy. Resulting severe right and moderate left ?foraminal stenosis. Moderate canal stenosis. ?  ?C5-C6: Posterior disc osteophyte complex with left paracentral disc ?protrusion and annular fissure. This disc protrusion contacts and ?flattens the ventral cord with overall mild canal stenosis. Right ?greater than left facet and uncovertebral hypertrophy. Resulting ?moderate right foraminal stenosis. ?  ?C6-C7: Posterior disc osteophyte complex and bilateral uncovertebral ?hypertrophy. Resulting mild bilateral foraminal stenosis. Mild to ?moderate canal stenosis with disc flattening the ventral cord. ?  ?C7-T1: Bilateral facet uncovertebral hypertrophy without significant ?canal or foraminal stenosis. ?  ?IMPRESSION: ?1. Canal stenosis is greatest at C4-C5 where it is moderate. ?Mild-to-moderate canal stenosis at C2-C3  and C6-C7 and multilevel ?mild canal stenosis as detailed above. ?2. Foraminal stenosis is greatest on the right at C4-C5 where it is ?severe. Multilevel moderate foraminal stenosis is detailed above. ?  ?Electronically Signed: ?By: Margaretha Sheffield M.D. ?On: 12/25/2021 10:17 ?ADDENDUM REPORT: 12/25/2021 13:15 ?  ?ADDENDUM: ?Small focus of T2 hyperintensity within the left eccentric cord at ?C4-C5, which could represent mild edema and/or myelomalacia given ?disc protrusion at this level. Artifact is also a consideration ?given there appears to be some artifact in this region. ?  ?These results will be called to the ordering clinician or ?representative by the Radiologist Assistant, and communication ?documented in the PACS or [employer]. ?  ?  ?Electronically Signed ?  By: Margaretha Sheffield M.D. ?  On: 12/25/2021 13:15 ? ?PMFS History: ?Patient Active Problem List  ? Diagnosis Date Noted  ? Spinal stenosis of cervical region 01/03/2022  ? Neck pain 12/03/2021  ? Abnormal liver function tests 02/12/2021  ? Diarrhea 02/12/2021  ? Gastro-esophageal reflux disease without esophagitis 02/12/2021  ? Periumbilical pain 67/20/9470  ? Personal history of colonic polyps 02/12/2021  ?  Portal hypertension ([place]) 02/12/2021  ? Screening for malignant neoplasm of colon 02/12/2021  ? Type 2 diabetes mellitus without complications ([place]) 47/99/8721  ? Primary osteoarthritis, left hand 11/07/2020  ? Arthritis of carpometacarpal [facility]) joint of thumb 07/18/2020  ? Bilateral primary osteoarthritis of knee 06/20/2020  ? Bilateral primary osteoarthritis of hip 06/07/2020  ? Right thigh pain 05/09/2020  ? Low back pain 07/14/2019  ? OSA on CPAP 05/16/2019  ? Diabetic neuropathy associated with type 2 diabetes mellitus ([place]) 12/13/2018  ? Chronic pain of right knee 12/09/2018  ? Thumb pain, left 12/09/2018  ? Dizziness and giddiness 01/14/2016  ? Hepatic cirrhosis ([place]) 07/26/2015  ? Nocturnal seizures ([place]) 05/14/2015  ? Chronic  hepatitis C without hepatic coma ([place]) 03/14/2015  ? Vertigo, central 02/11/2014  ? Difficulty walking 02/11/2014  ? Diabetes mellitus ([place]) 02/11/2014  ? Dysphagia 02/11/2014  ? OBESITY 07/26/2010  ? [place]

## 2022-01-06 DIAGNOSIS — M25671 Stiffness of right ankle, not elsewhere classified: Secondary | ICD-10-CM | POA: Diagnosis not present

## 2022-01-06 DIAGNOSIS — M25672 Stiffness of left ankle, not elsewhere classified: Secondary | ICD-10-CM | POA: Diagnosis not present

## 2022-01-06 DIAGNOSIS — M25572 Pain in left ankle and joints of left foot: Secondary | ICD-10-CM | POA: Diagnosis not present

## 2022-01-06 DIAGNOSIS — M25571 Pain in right ankle and joints of right foot: Secondary | ICD-10-CM | POA: Diagnosis not present

## 2022-01-08 DIAGNOSIS — M25572 Pain in left ankle and joints of left foot: Secondary | ICD-10-CM | POA: Diagnosis not present

## 2022-01-08 DIAGNOSIS — M25571 Pain in right ankle and joints of right foot: Secondary | ICD-10-CM | POA: Diagnosis not present

## 2022-01-08 DIAGNOSIS — M25672 Stiffness of left ankle, not elsewhere classified: Secondary | ICD-10-CM | POA: Diagnosis not present

## 2022-01-08 DIAGNOSIS — M25671 Stiffness of right ankle, not elsewhere classified: Secondary | ICD-10-CM | POA: Diagnosis not present

## 2022-01-10 ENCOUNTER — Other Ambulatory Visit: Payer: Self-pay | Admitting: Cardiovascular Disease

## 2022-01-13 DIAGNOSIS — M25672 Stiffness of left ankle, not elsewhere classified: Secondary | ICD-10-CM | POA: Diagnosis not present

## 2022-01-13 DIAGNOSIS — M25671 Stiffness of right ankle, not elsewhere classified: Secondary | ICD-10-CM | POA: Diagnosis not present

## 2022-01-13 DIAGNOSIS — M25571 Pain in right ankle and joints of right foot: Secondary | ICD-10-CM | POA: Diagnosis not present

## 2022-01-13 DIAGNOSIS — M25572 Pain in left ankle and joints of left foot: Secondary | ICD-10-CM | POA: Diagnosis not present

## 2022-01-21 ENCOUNTER — Encounter: Payer: Self-pay | Admitting: Orthopaedic Surgery

## 2022-01-21 ENCOUNTER — Ambulatory Visit (INDEPENDENT_AMBULATORY_CARE_PROVIDER_SITE_OTHER): Payer: HMO | Admitting: Orthopaedic Surgery

## 2022-01-21 ENCOUNTER — Telehealth: Payer: Self-pay | Admitting: Orthopaedic Surgery

## 2022-01-21 VITALS — BP 144/83 | Ht 64.0 in | Wt 234.0 lb

## 2022-01-21 DIAGNOSIS — M4802 Spinal stenosis, cervical region: Secondary | ICD-10-CM

## 2022-01-21 NOTE — Telephone Encounter (Signed)
Pt dropped off forms , per [facility] on [place] Desk --No Charge to patient  ?

## 2022-01-21 NOTE — Progress Notes (Signed)
? ?Office Visit Note ?  ?Patient: Cathy Gonzalez           ?Date of Birth: 04/04/1946           ?MRN: [id number] ?Visit Date: 01/21/2022 ?             ?Requested by: [name], MD ?[place] ?[address] ?[place],  [place] [address] ?PCP: [name], MD ? ? ?Assessment & Plan: ?Visit Diagnoses:  ?1. Spinal stenosis of cervical region   ? ? ?Plan: Reviewed MRI scan plain radiographs.  I reviewed this and discussed planned procedure with the patient's son.  We discussed use of a collar risks of surgery.  Problems with dysphagia dysphonia potential for progression at other levels.  Discussed mild to moderate stenosis present and other levels of the cervical spine.  We elected spine models that it had anterior plating. ? ?Follow-Up Instructions: No follow-ups on file.  ? ?Orders:  ?No orders of the defined types were placed in this encounter. ? ?No orders of the defined types were placed in this encounter. ? ? ? ? Procedures: ?No procedures performed ? ? ?Clinical Data: ?No additional findings. ? ? ?Subjective: ?Chief Complaint  ?Patient presents with  ? Neck - Pain  ? ? ?HPI 76 year old female returns with stenosis C4-5 severe foraminal stenosis on the right consistent with her symptoms and and cord edema on MRI scan.  We had discussed single level cervical fusion at the C4-5 level.  Patient brought her son to today's visit and wants to discuss surgical plan as well as diagnosis and indications once again.  She has taken anti-inflammatories without relief she does have diabetes which is under good control.  She does have sleep apnea uses a CPAP machine.  MRI scan showed stenosis at C4-5 centrally with cord edema at that level and also severe foraminal stenosis on the right.  Patient has right arm weakness numbness tingling in her hands drops objects has less strength in her right hand versus left.  When she turns her neck pain wakes her up at night.  Pain shoots from her neck down to the lumbar region with forward flexion of  her neck.  No bowel bladder symptoms.  She has noticed some difficulty walking but relates this to the right knee which she has been wearing a knee brace.  She has not had any recent falls.  She does describe claudication type symptoms lower extremities with prolonged standing or walking. ? ?Review of Systems past right knee osteoarthritis.  Type 2 diabetes A1c less than 7 history of low back pain hepatic cirrhosis portal hypertension.  All other systems are noncontributory to HPI. ? ? ?Objective: ?Vital Signs: BP (!) 144/83   Ht '5\' 4"'$  (1.626 m)   Wt 234 lb (106.1 kg)   BMI 40.17 kg/m?  ? ?Physical Exam ?Constitutional:   ?   Appearance: She is well-developed.  ?HENT:  ?   Head: Normocephalic.  ?   Right Ear: External ear normal.  ?   Left Ear: External ear normal. There is no impacted cerumen.  ?Eyes:  ?   Pupils: Pupils are equal, round, and reactive to light.  ?Neck:  ?   Thyroid: No thyromegaly.  ?   Trachea: No tracheal deviation.  ?Cardiovascular:  ?   Rate and Rhythm: Normal rate.  ?Pulmonary:  ?   Effort: Pulmonary effort is normal.  ?Abdominal:  ?   Palpations: Abdomen is soft.  ?Musculoskeletal:  ?  Cervical back: No rigidity.  ?Skin: ?   General: Skin is warm and dry.  ?Neurological:  ?   Mental Status: She is alert and oriented to person, place, and time.  ?Psychiatric:     ?   Behavior: Behavior normal.  ? ? ?Ortho Exam positive brachial plexus tenderness on the right negative on the left.  Reflexes are 2+ and symmetrical positive Lhermitte.  No lower extremity clonus.  She has crepitus with knee range of motion on the right.  Distal pulses are intact.  No lower extremity hyperreflexia 2+ knee and ankle jerks symmetrical. ? ?Specialty Comments:  ?No specialty comments available. ? ?Imaging: ?No results found. ? ? ?PMFS History: ?Patient Active Problem List  ? Diagnosis Date Noted  ? Spinal stenosis of cervical region 01/03/2022  ? Neck pain 12/03/2021  ? Abnormal liver function tests 02/12/2021  ?  Diarrhea 02/12/2021  ? Gastro-esophageal reflux disease without esophagitis 02/12/2021  ? Periumbilical pain 10/14/3233  ? Personal history of colonic polyps 02/12/2021  ? Portal hypertension ([place]) 02/12/2021  ? Screening for malignant neoplasm of colon 02/12/2021  ? Type 2 diabetes mellitus without complications ([place]) 57/32/2025  ? Primary osteoarthritis, left hand 11/07/2020  ? Arthritis of carpometacarpal [facility]) joint of thumb 07/18/2020  ? Bilateral primary osteoarthritis of knee 06/20/2020  ? Bilateral primary osteoarthritis of hip 06/07/2020  ? Right thigh pain 05/09/2020  ? Low back pain 07/14/2019  ? OSA on CPAP 05/16/2019  ? Diabetic neuropathy associated with type 2 diabetes mellitus ([place]) 12/13/2018  ? Chronic pain of right knee 12/09/2018  ? Thumb pain, left 12/09/2018  ? Dizziness and giddiness 01/14/2016  ? Hepatic cirrhosis ([place]) 07/26/2015  ? Nocturnal seizures ([place]) 05/14/2015  ? Chronic hepatitis C without hepatic coma ([place]) 03/14/2015  ? Vertigo, central 02/11/2014  ? Difficulty walking 02/11/2014  ? Diabetes mellitus ([place]) 02/11/2014  ? Dysphagia 02/11/2014  ? OBESITY 07/26/2010  ? Essential hypertension, benign 07/26/2010  ? DEGENERATIVE JOINT DISEASE, GENERALIZED 07/26/2010  ? ?Past Medical History:  ?Diagnosis Date  ? Arthritis   ? Cataract   ? left cataract removed in 2017  ? Diabetes mellitus   ? Diabetic neuropathy ([place])   ? Diabetic neuropathy, type II diabetes mellitus ([place])   ? Hepatitis C   ? took tx for   ? Hypertension   ? Hypokalemia   ? Knee pain   ? right   ? Morbidly obese ([place])   ? Nocturnal seizures ([place]) 05/14/2015  ? no bad seizure since 1980's  ? OSA on CPAP   ? uses cpap 2-3 week  ? Plantar fasciitis   ? both feet  ?  ?Family History  ?Problem Relation Age of Onset  ? Diabetes Mother   ? Heart attack Mother   ? Diabetes Sister   ? Diabetes Brother   ? Cancer Sister   ?     breast  ? Breast cancer Sister   ? Seizures Neg Hx   ?  ?Past Surgical History:  ?Procedure Laterality  Date  ? ABDOMINAL HYSTERECTOMY    ? partial  ? CATARACT EXTRACTION Left   ? COLONOSCOPY WITH PROPOFOL N/A 08/21/2015  ? Procedure: COLONOSCOPY WITH PROPOFOL;  Surgeon: Carol Ada, MD;  Location: [facility] END[facility];  Service: Endoscopy;  Laterality: N/A;  ? ESOPHAGOGASTRODUODENOSCOPY (EGD) WITH PROPOFOL N/A 08/21/2015  ? Procedure: ESOPHAGOGASTRODUODENOSCOPY (EGD) WITH PROPOFOL;  Surgeon: Carol Ada, MD;  Location: [facility];  Service: Endoscopy;  Laterality: N/A;  ? ESOPHAGOGASTRODUODENOSCOPY (EGD) WITH PROPOFOL N/A  10/08/2018  ? Procedure: ESOPHAGOGASTRODUODENOSCOPY (EGD) WITH PROPOFOL;  Surgeon: Carol Ada, MD;  Location: [facility] END[facility];  Service: Endoscopy;  Laterality: N/A;  ? EYE SURGERY Bilateral   ? cataracts  ? ?Social History  ? ?Occupational History  ? Occupation: [name]  ?Tobacco Use  ? Smoking status: Never  ? Smokeless tobacco: Never  ?Vaping Use  ? Vaping Use: Never used  ?Substance and Sexual Activity  ? Alcohol use: No  ?  Alcohol/week: 0.0 standard drinks  ? Drug use: No  ? Sexual activity: Never  ? ? ? ? ? ? ?

## 2022-01-22 DIAGNOSIS — M25572 Pain in left ankle and joints of left foot: Secondary | ICD-10-CM | POA: Diagnosis not present

## 2022-01-22 DIAGNOSIS — M25672 Stiffness of left ankle, not elsewhere classified: Secondary | ICD-10-CM | POA: Diagnosis not present

## 2022-01-22 DIAGNOSIS — M25671 Stiffness of right ankle, not elsewhere classified: Secondary | ICD-10-CM | POA: Diagnosis not present

## 2022-01-22 DIAGNOSIS — M25571 Pain in right ankle and joints of right foot: Secondary | ICD-10-CM | POA: Diagnosis not present

## 2022-01-23 ENCOUNTER — Telehealth: Payer: Self-pay | Admitting: Radiology

## 2022-01-23 NOTE — Telephone Encounter (Signed)
I left voicemail for patient advising FMLA paperwork ready and at front for pick up. I did send return to work form back with paperwork as well and advised she would need to bring this to 6 week follow up appointment to be completed if able to return to work. ?

## 2022-01-27 ENCOUNTER — Encounter: Payer: Self-pay | Admitting: Podiatrist

## 2022-01-27 ENCOUNTER — Ambulatory Visit: Payer: PPO | Admitting: Podiatrist

## 2022-01-27 ENCOUNTER — Other Ambulatory Visit: Payer: Self-pay

## 2022-01-27 DIAGNOSIS — M7662 Achilles tendinitis, left leg: Secondary | ICD-10-CM | POA: Diagnosis not present

## 2022-01-27 DIAGNOSIS — M722 Plantar fascial fibromatosis: Secondary | ICD-10-CM

## 2022-01-27 DIAGNOSIS — M7661 Achilles tendinitis, right leg: Secondary | ICD-10-CM

## 2022-01-27 DIAGNOSIS — M25672 Stiffness of left ankle, not elsewhere classified: Secondary | ICD-10-CM | POA: Diagnosis not present

## 2022-01-27 DIAGNOSIS — M25571 Pain in right ankle and joints of right foot: Secondary | ICD-10-CM | POA: Diagnosis not present

## 2022-01-27 DIAGNOSIS — M25671 Stiffness of right ankle, not elsewhere classified: Secondary | ICD-10-CM | POA: Diagnosis not present

## 2022-01-27 DIAGNOSIS — M25572 Pain in left ankle and joints of left foot: Secondary | ICD-10-CM | POA: Diagnosis not present

## 2022-01-27 MED ORDER — TRIAMCINOLONE ACETONIDE 10 MG/ML IJ SUSP
10.0000 mg | Freq: Once | INTRAMUSCULAR | Status: AC
Start: 2022-01-27 — End: 2022-01-29
  Administered 2022-01-29: 10 mg

## 2022-01-27 NOTE — Progress Notes (Signed)
Chief Complaint  ?Patient presents with  ? Foot Pain  ?  Bil heel pain. Patient states that her heel pain has been going on for about 2 months.   ?  ? ?HPI: Patient is 76 y.o. female who presents today for heel pain bilateral.  She relates she has pain with first step in the morning or when getting up after sitting for long periods of time.  She is currently in physical therapy and has noticed some improvement in symptoms.   ? ? ?Allergies  ?Allergen Reactions  ? Acetaminophen   ?  Other reaction(s): GI upset  ? Pregabalin   ?  Other reaction(s): diarrhea  ? ? ?Review of systems is negative except as noted in the HPI.  Denies nausea/ vomiting/ fevers/ chills or night sweats.   Denies difficulty breathing, denies calf pain or tenderness ? ?Physical Exam ? ? ?  ?GENERAL APPEARANCE: Alert, conversant. Appropriately groomed. No acute distress.  ?  ?VASCULAR: Pedal pulses palpable DP and PT bilateral.  Capillary refill time is immediate to all digits,  Proximal to distal cooling it warm to warm.  Digital perfusion adequate.  ?  ?NEUROLOGIC: unchanged/  sensation is decreased to 5.07 monofilament at 2/5 sites bilateral.  Light touch is intact bilateral, vibratory sensation absent bilateral.  Negative tinnels sign bilateral.  ?  ?MUSCULOSKELETAL: acceptable muscle strength, tone and stability bilateral.  Pes planus foot deformity noted.  Limitation of range of motion at the ankle joint with knee extended and knee flexed is noted.  Pain in the Achilles with dorsiflexion of bilateral ankles is noted.  pain at the insertion of the plantar fascia on the medial calcaneal tubercle is noted bialteral.  ?  ?DERMATOLOGIC: skin is warm, supple, and dry.  No open lesions noted.  No rash, no pre ulcerative lesions. Digital nails are asymptomatic.   ? ? ?Assessment: ?  ICD-10-CM   ?1. Plantar fasciitis, bilateral  M72.2 triamcinolone acetonide (KENALOG) 10 MG/ML injection 10 mg  ?  ?2. Achilles tendonitis, bilateral  M76.61   ? M76.62    ?  ? ? ? ?Plan: ?Discussed exam findings and discussed her pain is in the area of the plantar fascial insertion and I recommended an injection since she did so well with the last injection she received in the midfoot. She has been going to physical therapy and improving greatly and she is happy with her progress. ?A steroid injection was performed today under sterile technique.  I dispensed heel lifts for her as well and she is to continue pt until her disc surgery.  She will call if she needs further follow up as needed. ? ?Procedure: Injection right and left heel.  ?Discussed alternatives, risks, complications and verbal consent was obtained.  ?Skin Prep: Alcohol. ?Injectate: 0.5cc 0.5% marcaine plain, 1cc kenalog 10 ?Disposition: Patient tolerated procedure well. Injection site dressed with a band-aid.  ?Post-injection care was discussed and return precautions discussed.   ? ?

## 2022-01-29 DIAGNOSIS — M7662 Achilles tendinitis, left leg: Secondary | ICD-10-CM | POA: Diagnosis not present

## 2022-01-29 DIAGNOSIS — M722 Plantar fascial fibromatosis: Secondary | ICD-10-CM

## 2022-01-29 DIAGNOSIS — M7661 Achilles tendinitis, right leg: Secondary | ICD-10-CM | POA: Diagnosis not present

## 2022-02-02 DIAGNOSIS — I10 Essential (primary) hypertension: Secondary | ICD-10-CM | POA: Diagnosis not present

## 2022-02-02 DIAGNOSIS — E118 Type 2 diabetes mellitus with unspecified complications: Secondary | ICD-10-CM | POA: Diagnosis not present

## 2022-02-03 DIAGNOSIS — E785 Hyperlipidemia, unspecified: Secondary | ICD-10-CM | POA: Diagnosis not present

## 2022-02-03 DIAGNOSIS — I10 Essential (primary) hypertension: Secondary | ICD-10-CM | POA: Diagnosis not present

## 2022-02-03 DIAGNOSIS — E1169 Type 2 diabetes mellitus with other specified complication: Secondary | ICD-10-CM | POA: Diagnosis not present

## 2022-02-03 DIAGNOSIS — E6609 Other obesity due to excess calories: Secondary | ICD-10-CM | POA: Diagnosis not present

## 2022-02-06 DIAGNOSIS — M25572 Pain in left ankle and joints of left foot: Secondary | ICD-10-CM | POA: Diagnosis not present

## 2022-02-06 DIAGNOSIS — M25571 Pain in right ankle and joints of right foot: Secondary | ICD-10-CM | POA: Diagnosis not present

## 2022-02-06 DIAGNOSIS — M25672 Stiffness of left ankle, not elsewhere classified: Secondary | ICD-10-CM | POA: Diagnosis not present

## 2022-02-06 DIAGNOSIS — M25671 Stiffness of right ankle, not elsewhere classified: Secondary | ICD-10-CM | POA: Diagnosis not present

## 2022-02-10 DIAGNOSIS — E1169 Type 2 diabetes mellitus with other specified complication: Secondary | ICD-10-CM | POA: Diagnosis not present

## 2022-02-10 DIAGNOSIS — N189 Chronic kidney disease, unspecified: Secondary | ICD-10-CM | POA: Diagnosis not present

## 2022-02-10 DIAGNOSIS — G629 Polyneuropathy, unspecified: Secondary | ICD-10-CM | POA: Diagnosis not present

## 2022-02-10 DIAGNOSIS — I10 Essential (primary) hypertension: Secondary | ICD-10-CM | POA: Diagnosis not present

## 2022-02-13 ENCOUNTER — Encounter: Payer: Self-pay | Admitting: Surgery

## 2022-02-13 ENCOUNTER — Ambulatory Visit (INDEPENDENT_AMBULATORY_CARE_PROVIDER_SITE_OTHER): Payer: PPO

## 2022-02-13 ENCOUNTER — Ambulatory Visit (INDEPENDENT_AMBULATORY_CARE_PROVIDER_SITE_OTHER): Payer: HMO | Admitting: Surgery

## 2022-02-13 ENCOUNTER — Ambulatory Visit (INDEPENDENT_AMBULATORY_CARE_PROVIDER_SITE_OTHER): Payer: PPO | Admitting: Sports Medicine

## 2022-02-13 ENCOUNTER — Encounter: Payer: Self-pay | Admitting: Sports Medicine

## 2022-02-13 VITALS — BP 134/74 | HR 85 | Ht 64.0 in | Wt 220.0 lb

## 2022-02-13 DIAGNOSIS — E1140 Type 2 diabetes mellitus with diabetic neuropathy, unspecified: Secondary | ICD-10-CM | POA: Diagnosis not present

## 2022-02-13 DIAGNOSIS — M7752 Other enthesopathy of left foot: Secondary | ICD-10-CM

## 2022-02-13 DIAGNOSIS — E1149 Type 2 diabetes mellitus with other diabetic neurological complication: Secondary | ICD-10-CM

## 2022-02-13 DIAGNOSIS — M79672 Pain in left foot: Secondary | ICD-10-CM

## 2022-02-13 DIAGNOSIS — M21622 Bunionette of left foot: Secondary | ICD-10-CM | POA: Diagnosis not present

## 2022-02-13 DIAGNOSIS — M4802 Spinal stenosis, cervical region: Secondary | ICD-10-CM

## 2022-02-13 MED ORDER — TRIAMCINOLONE ACETONIDE 10 MG/ML IJ SUSP
10.0000 mg | Freq: Once | INTRAMUSCULAR | Status: AC
  Administered 2022-02-13: 10 mg

## 2022-02-13 NOTE — Progress Notes (Signed)
Subjective: ?Cathy Gonzalez is a 76 y.o. female patient who presents to office for evaluation of left foot pain at the fifth toe joint.  Patient reports that over the last week the left fifth toe joint has increasingly become more red swollen and painful.  Patient reports that she is currently in rehab for her heel pain and states that she has been doing a lot of stretching and exercise and is not sure if this have caused her pain to flareup.  Patient reports that she use a Salonpas patch to the area which seemed to help some.  Patient denies any other pedal complaints at this time.  ? ?Patient Active Problem List  ? Diagnosis Date Noted  ? Spinal stenosis of cervical region 01/03/2022  ? Neck pain 12/03/2021  ? Abnormal liver function tests 02/12/2021  ? Diarrhea 02/12/2021  ? Gastro-esophageal reflux disease without esophagitis 02/12/2021  ? Periumbilical pain 59/93/5701  ? Personal history of colonic polyps 02/12/2021  ? Portal hypertension ([place]) 02/12/2021  ? Screening for malignant neoplasm of colon 02/12/2021  ? Type 2 diabetes mellitus without complications ([place]) 77/93/9030  ? Primary osteoarthritis, left hand 11/07/2020  ? Arthritis of carpometacarpal [facility]) joint of thumb 07/18/2020  ? Bilateral primary osteoarthritis of knee 06/20/2020  ? Bilateral primary osteoarthritis of hip 06/07/2020  ? Right thigh pain 05/09/2020  ? Low back pain 07/14/2019  ? OSA on CPAP 05/16/2019  ? Diabetic neuropathy associated with type 2 diabetes mellitus ([place]) 12/13/2018  ? Chronic pain of right knee 12/09/2018  ? Thumb pain, left 12/09/2018  ? Dizziness and giddiness 01/14/2016  ? Hepatic cirrhosis ([place]) 07/26/2015  ? Nocturnal seizures ([place]) 05/14/2015  ? Chronic hepatitis C without hepatic coma ([place]) 03/14/2015  ? Vertigo, central 02/11/2014  ? Difficulty walking 02/11/2014  ? Diabetes mellitus ([place]) 02/11/2014  ? Dysphagia 02/11/2014  ? OBESITY 07/26/2010  ? Essential hypertension, benign 07/26/2010  ? DEGENERATIVE JOINT  DISEASE, GENERALIZED 07/26/2010  ? ? ?Current Outpatient Medications on File Prior to Visit  ?Medication Sig Dispense Refill  ? acetaminophen (TYLENOL) 500 MG tablet Take 500 mg by mouth every 6 (six) hours as needed for moderate pain.    ? amLODipine (NORVASC) 10 MG tablet Take 5 mg by mouth daily.    ? Blood Glucose Monitoring Suppl (ONE TOUCH ULTRA 2) w/Device KIT 2 (two) times daily. as directed    ? colchicine 0.6 MG tablet Take 0.6 mg by mouth at bedtime.    ? diclofenac Sodium (VOLTAREN) 1 % GEL Apply 2 g topically in the morning and at bedtime.    ? empagliflozin (JARDIANCE) 10 MG TABS tablet TAKE 1 TABLET BY MOUTH ONCE DAILY BEFORE BREAKFAST 90 tablet 3  ? ezetimibe (ZETIA) 10 MG tablet Take 10 mg by mouth at bedtime.     ? furosemide (LASIX) 40 MG tablet Take 40 mg by mouth at bedtime.    ? Insulin Aspart, w/Niacinamide, (FIASP) 100 UNIT/ML SOLN Inject 3-5 Units as directed daily as needed (150).    ? levETIRAcetam (KEPPRA) 750 MG tablet Take 2 tablets (1,500 mg total) by mouth 2 (two) times daily. 360 tablet 4  ? metFORMIN (GLUCOPHAGE) 500 MG tablet Take 500 mg by mouth at bedtime.     ? montelukast (SINGULAIR) 10 MG tablet Take 10 mg by mouth at bedtime.    ? ONE TOUCH ULTRA TEST test strip   5  ? ONETOUCH DELICA LANCETS 09Q MISC   3  ? phenazopyridine (PYRIDIUM) 100 MG tablet  Take 100 mg by mouth 3 (three) times daily.    ? Potassium Chloride ER 20 MEQ TBCR Take 20 mEq by mouth daily.  6  ? pregabalin (LYRICA) 300 MG capsule Take 1 capsule (300 mg total) by mouth 2 (two) times daily. 60 capsule 0  ? sucralfate (CARAFATE) 1 G tablet Take 1 g by mouth 2 (two) times daily.     ? telmisartan (MICARDIS) 80 MG tablet Take 80 mg by mouth daily.   1  ? ?No current facility-administered medications on file prior to visit.  ? ? ?Allergies  ?Allergen Reactions  ? Acetaminophen   ?  Other reaction(s): GI upset, constipation if taken regularly  ? ? ?Objective:  ?General: Alert and oriented x3 in no acute  distress ? ?Dermatology: No open lesions bilateral lower extremities, no webspace macerations, no ecchymosis bilateral, all nails x 10 are well manicured.  Minimal reactive keratosis plantar fifth metatarsal head on the left. ? ?Vascular: Dorsalis Pedis and Posterior Tibial pedal pulses palpable, Capillary Fill Time 3 seconds,(+) pedal hair growth bilateral, no edema bilateral lower extremities, Temperature gradient within normal limits. ? ?Neurology: Gross sensation intact via light touch bilateral.  However protective diminished as previously noted bilateral. ? ?Musculoskeletal: Mild tenderness with palpation on today's exam at the fifth metatarsal phalangeal joint on the left, there is mild prominence of the fifth metatarsal head and minimal reactive plantar keratosis to this area. ? ?Gait: Antalgic gait ? ?Xrays  ?Left foot ?  Impression: No acute fracture or dislocation at the area of concern of pain along the fifth metatarsal and fifth toe ? ?Assessment and Plan: ?Problem List Items Addressed This Visit   ?None ?Visit Diagnoses   ? ? Capsulitis of toe of left foot    -  Primary  ? Relevant Medications  ? triamcinolone acetonide (KENALOG) 10 MG/ML injection 10 mg (Completed) (Start on 02/13/2022  9:15 PM)  ? Other Relevant Orders  ? DG Foot Complete Left  ? Tailor's bunion of left foot      ? Left foot pain      ? Diabetic neuropathy with neurologic complication ([place])      ? ?  ? ? ? ? ?-Complete examination performed ?-Xrays reviewed ?-Discussed treatement options for likely capsulitis at the fifth MPJ with early tailor's bunion deformity ?-After oral consent and aseptic prep, injected a mixture containing 1 ml of 2%  ?plain lidocaine, 1 ml 0.5% plain marcaine, 0.5 ml of kenalog 10 and 0.5 ml of dexamethasone phosphate into left fifth metatarsophalangeal joint without complication. Post-injection care discussed with patient.  ?-Dispensed tailor's bunion cushion for patient to use as directed in shoes ?-Patient  may continue with physical therapy however to avoid activities that may cause excessive shoe or rubbing at the fifth MPJ ?-Continue with shoes that provide support ?-Recommend foot miracle cream for reactive keratosis plantar left forefoot however today at no additional charge I did mechanically debride the small area of keratosis plantar fifth metatarsal head on the left using a sterile 15 blade without incident ?-Patient to return to office as needed or sooner if condition worsens. ? ?Landis Martins, DPM ?  ?

## 2022-02-13 NOTE — Progress Notes (Signed)
76 year old black female history of C4-5 HNP/stenosis comes in for preop evaluation.  States neck pain and upper extremity radiculopathy unchanged from previous visit.  She is wanting to proceed with C4-5 ACDF is scheduled.  Today history and physical performed.  Review of systems positive for seizures "at night when I sleep".  History of sleep apnea and uses CPAP.     Patient followed by cardiologist Dr. Davina Poke, neurologist Dr. Jannifer Franklin and PCP Dr. Criss Rosales.  Dr. Lorin Mercy did not request preop clearances from these providers.  Surgical procedure discussed.  All questions answered.  Advised patient to bring in her face mask and hose for the CPAP.  We will need this in the hospital. ?

## 2022-02-14 NOTE — Pre-Procedure Instructions (Signed)
Surgical Instructions ? ? ? Your procedure is scheduled on Monday, May 22nd. ? Report to [facility] Main Entrance "A" at 10:30 A.M., then check in with the Admitting office. ? Call this number if you have problems the morning of surgery: ? [phone] ? ? If you have any questions prior to your surgery date call [phone]: Open Monday-Friday 8am-4pm ? ? ? Remember: ? Do not eat after midnight the night before your surgery ? ?You may drink clear liquids until 09:30 AM the morning of your surgery.   ?Clear liquids allowed are: Water, Non-Citrus Juices (without pulp), Carbonated Beverages, Clear Tea, Black Coffee Only (NO MILK, CREAM OR POWDERED CREAMER of any kind), and Gatorade. ? ?Patient Instructions ? ?The night before surgery:  ?No food after midnight. ONLY clear liquids after midnight ? ? ?The day of surgery (if you have diabetes): ?Drink ONE (1) 12 oz G2 given to you in your pre admission testing appointment by 09:30 AM the morning of surgery. Drink in one sitting. Do not sip.  ?This drink was given to you during your hospital  ?pre-op appointment visit.  ?Nothing else to drink after completing the  ?12 oz bottle of G2. ? ?       If you have questions, please contact your surgeon?s office.  ? ? ?  ? Take these medicines the morning of surgery with A SIP OF WATER  ?amLODipine (NORVASC)  ?levETIRAcetam (KEPPRA)  ?phenazopyridine (PYRIDIUM)  ?pregabalin (LYRICA) ?sucralfate (CARAFATE) ? ? ?If needed: ?acetaminophen (TYLENOL)  ? ?As of today, STOP taking any Aspirin (unless otherwise instructed by your surgeon) Aleve, Naproxen, Ibuprofen, Motrin, Advil, Goody's, BC's, all herbal medications, fish oil, and all vitamins. This includes your diclofenac Sodium (VOLTAREN) 1 % GEL. ? ? ?WHAT DO I DO ABOUT MY DIABETES MEDICATION? ? ? ?Do not take empagliflozin (JARDIANCE) the day before surgery (5/21) or the morning of surgery (5/22). ?Do not take metFORMIN (GLUCOPHAGE) the morning of surgery (5/22). ?Take usual dose  of Insulin Aspart, w/Niacinamide, (FIASP) the day before surgery (5/21). Do not take on the morning of surgery (5/22). ? ?If your CBG is greater than 220 mg/dL, you may take ? of your sliding scale (correction) dose of insulin. ? ? ?HOW TO MANAGE YOUR DIABETES ?BEFORE AND AFTER SURGERY ? ?Why is it important to control my blood sugar before and after surgery? ?Improving blood sugar levels before and after surgery helps healing and can limit problems. ?A way of improving blood sugar control is eating a healthy diet by: ? Eating less sugar and carbohydrates ? Increasing activity/exercise ? Talking with your doctor about reaching your blood sugar goals ?High blood sugars (greater than 180 mg/dL) can raise your risk of infections and slow your recovery, so you will need to focus on controlling your diabetes during the weeks before surgery. ?Make sure that the doctor who takes care of your diabetes knows about your planned surgery including the date and location. ? ?How do I manage my blood sugar before surgery? ?Check your blood sugar at least 4 times a day, starting 2 days before surgery, to make sure that the level is not too high or low. ? ?Check your blood sugar the morning of your surgery when you wake up and every 2 hours until you get to the Short Stay unit. ? ?If your blood sugar is less than 70 mg/dL, you will need to treat for low blood sugar: ?Do not take insulin. ?Treat a low blood sugar (less than 70  mg/dL) with ? cup of clear juice (cranberry or apple), 4 glucose tablets, OR glucose gel. ?Recheck blood sugar in 15 minutes after treatment (to make sure it is greater than 70 mg/dL). If your blood sugar is not greater than 70 mg/dL on recheck, call [phone] for further instructions. ?Report your blood sugar to the short stay nurse when you get to Short Stay. ? ?If you are admitted to the hospital after surgery: ?Your blood sugar will be checked by the staff and you will probably be given insulin after  surgery (instead of oral diabetes medicines) to make sure you have good blood sugar levels. ?The goal for blood sugar control after surgery is 80-180 mg/dL.          ?           ?Do NOT Smoke (Tobacco/Vaping) for 24 hours prior to your procedure. ? ?If you use a CPAP at night, you may bring your mask/headgear for your overnight stay. ?  ?Contacts, glasses, piercing's, hearing aid's, dentures or partials may not be worn into surgery, please bring cases for these belongings.  ?  ?For patients admitted to the hospital, discharge time will be determined by your treatment team. ?  ?Patients discharged the day of surgery will not be allowed to drive home, and someone needs to stay with them for 24 hours. ? ?SURGICAL WAITING ROOM VISITATION ?Patients having surgery or a procedure may have two support people in the waiting room. These visitors may be switched out with other visitors if needed. ?Children under the age of 26 must have an adult accompany them who is not the patient. ?If the patient needs to stay at the hospital during part of their recovery, the visitor guidelines for inpatient rooms apply. ? ?Please refer to the [facility] website for the visitor guidelines for Inpatients (after your surgery is over and you are in a regular room).  ? ? ?Special instructions:   ?- Preparing For Surgery ? ?Before surgery, you can play an important role. Because skin is not sterile, your skin needs to be as free of germs as possible. You can reduce the number of germs on your skin by washing with CHG (chlorahexidine gluconate) Soap before surgery.  CHG is an antiseptic cleaner which kills germs and bonds with the skin to continue killing germs even after washing.   ? ?Oral Hygiene is also important to reduce your risk of infection.  Remember - BRUSH YOUR TEETH THE MORNING OF SURGERY WITH YOUR REGULAR TOOTHPASTE ? ?Please do not use if you have an allergy to CHG or antibacterial soaps. If your skin becomes  reddened/irritated stop using the CHG.  ?Do not shave (including legs and underarms) for at least 48 hours prior to first CHG shower. It is OK to shave your face. ? ?Please follow these instructions carefully. ?  ?Shower the NIGHT BEFORE SURGERY and the MORNING OF SURGERY ? ?If you chose to wash your hair, wash your hair first as usual with your normal shampoo. ? ?After you shampoo, rinse your hair and body thoroughly to remove the shampoo. ? ?Use CHG Soap as you would any other liquid soap. You can apply CHG directly to the skin and wash gently with a scrungie or a clean washcloth.  ? ?Apply the CHG Soap to your body ONLY FROM THE NECK DOWN.  Do not use on open wounds or open sores. Avoid contact with your eyes, ears, mouth and genitals (private parts). Wash Face and genitals (private  parts)  with your normal soap.  ? ?Wash thoroughly, paying special attention to the area where your surgery will be performed. ? ?Thoroughly rinse your body with warm water from the neck down. ? ?DO NOT shower/wash with your normal soap after using and rinsing off the CHG Soap. ? ?Pat yourself dry with a CLEAN TOWEL. ? ?Wear CLEAN PAJAMAS to bed the night before surgery ? ?Place CLEAN SHEETS on your bed the night before your surgery ? ?DO NOT SLEEP WITH PETS. ? ? ?Day of Surgery: ?Take a shower with CHG soap. ?Do not wear jewelry or makeup ?Do not wear lotions, powders, perfumes, or deodorant. ?Do not shave 48 hours prior to surgery.   ?Do not bring valuables to the hospital.  ?[facility] is not responsible for any belongings or valuables. ?Do not wear nail polish, gel polish, artificial nails, or any other type of covering on natural nails (fingers and toes) ?If you have artificial nails or gel coating that need to be removed by a nail salon, please have this removed prior to surgery. Artificial nails or gel coating may interfere with anesthesia's ability to adequately monitor your vital signs. ?Wear Clean/Comfortable clothing the  morning of surgery ?Remember to brush your teeth WITH YOUR REGULAR TOOTHPASTE. ?  ?Please read over the following fact sheets that you were given. ? ? ? ?If you received a COVID test during your pre-op visit  it is re

## 2022-02-17 ENCOUNTER — Encounter (HOSPITAL_COMMUNITY)
Admission: RE | Admit: 2022-02-17 | Discharge: 2022-02-17 | Disposition: A | Discharge status: Home / Self Care | Payer: HMO | Source: Ambulatory Visit | Attending: Orthopaedic Surgery | Admitting: Orthopaedic Surgery

## 2022-02-17 ENCOUNTER — Encounter (HOSPITAL_COMMUNITY): Payer: Self-pay

## 2022-02-17 ENCOUNTER — Other Ambulatory Visit: Payer: Self-pay

## 2022-02-17 VITALS — BP 140/78 | HR 75 | Temp 97.7°F | Resp 19 | Ht 64.0 in | Wt 227.7 lb

## 2022-02-17 DIAGNOSIS — E119 Type 2 diabetes mellitus without complications: Secondary | ICD-10-CM

## 2022-02-17 DIAGNOSIS — Z01818 Encounter for other preprocedural examination: Secondary | ICD-10-CM

## 2022-02-17 DIAGNOSIS — B182 Chronic viral hepatitis C: Secondary | ICD-10-CM | POA: Diagnosis not present

## 2022-02-17 LAB — COMPREHENSIVE METABOLIC PANEL
ALT: 54 U/L — ABNORMAL HIGH (ref 0–44)
AST: 41 U/L (ref 15–41)
Albumin: 3.9 g/dL (ref 3.5–5.0)
Alkaline Phosphatase: 89 U/L (ref 38–126)
Anion gap: 10 (ref 5–15)
BUN: 19 mg/dL (ref 8–23)
CO2: 25 mmol/L (ref 22–32)
Calcium: 9.2 mg/dL (ref 8.9–10.3)
Chloride: 103 mmol/L (ref 98–111)
Creatinine, Ser: 1.29 mg/dL — ABNORMAL HIGH (ref 0.44–1.00)
GFR, Estimated: 44 mL/min — ABNORMAL LOW (ref >60)
Glucose, Bld: 163 mg/dL — ABNORMAL HIGH (ref 70–99)
Potassium: 3.3 mmol/L — ABNORMAL LOW (ref 3.5–5.1)
Sodium: 138 mmol/L (ref 135–145)
Total Bilirubin: 0.6 mg/dL (ref 0.3–1.2)
Total Protein: 6.9 g/dL (ref 6.5–8.1)

## 2022-02-17 LAB — CBC
HCT: 41.3 % (ref 36.0–46.0)
Hemoglobin: 13.9 g/dL (ref 12.0–15.0)
MCH: 30.2 pg (ref 26.0–34.0)
MCHC: 33.7 g/dL (ref 30.0–36.0)
MCV: 89.6 fL (ref 80.0–100.0)
Platelets: 299 10*3/uL (ref 150–400)
RBC: 4.61 MIL/uL (ref 3.87–5.11)
RDW: 12.8 % (ref 11.5–15.5)
WBC: 6.6 10*3/uL (ref 4.0–10.5)
nRBC: 0 % (ref 0.0–0.2)

## 2022-02-17 LAB — HEMOGLOBIN A1C
Hgb A1c MFr Bld: 6.1 % — ABNORMAL HIGH (ref 4.8–5.6)
Mean Plasma Glucose: 128.37 mg/dL

## 2022-02-17 LAB — SURGICAL PCR SCREEN
MRSA, PCR: NEGATIVE
Staphylococcus aureus: NEGATIVE

## 2022-02-17 LAB — GLUCOSE, CAPILLARY: Glucose-Capillary: 182 mg/dL — ABNORMAL HIGH (ref 70–99)

## 2022-02-17 NOTE — Progress Notes (Signed)
PCP - Dr. [name] ?Cardiologist - Dr. Eleonore Chiquito (Pt says she sees him about once a year due to hx of PVC's) ? ?PPM/ICD - denies ? ? ?Chest x-ray - 12/03/13 ?EKG - 02/17/22 at PAT ?Stress Test - denies ?ECHO - 10/08/20 ?Cardiac Cath - denies ? ?Sleep Study - About 2 years ago, OSA+ ?CPAP - yes, nightly ? ?DM- Type 2 ?Fasting Blood Sugar - 80-110 ?Checks Blood Sugar 2-3 times a day ? ?ASA/Blood Thinner Instructions: n/a ? ? ?ERAS Protcol - yes ?PRE-SURGERY G2- given at PAT ? ?COVID TEST- n/a ? ? ?Anesthesia review: yes, cardiac hx? Pt says she sees Dr. Collie Siad O'Neal about once a year ? ?Patient denies shortness of breath, fever, cough and chest pain at PAT appointment ? ? ?All instructions explained to the patient, with a verbal understanding of the material. Patient agrees to go over the instructions while at home for a better understanding. Patient also instructed to notify surgeon of any contact with COVID + person or if she develops any symptoms. The opportunity to ask questions was provided. ?  ?

## 2022-02-18 NOTE — Progress Notes (Signed)
Anesthesia Chart Review:  ? Case: [id number] Date/Time: 02/24/22 1215  ? Procedures:  ?    C4-5 ANTERIOR CERVICAL DECOMPRESSION/DISCECTOMY FUSION 1 LEVEL ?    ALLOGRAFT PLATE  ? Anesthesia type: General  ? Pre-op diagnosis: C4-5 STENOSIS  ? Location: [facility] ROOM 04 / [facility] OR  ? Surgeons: [name], MD  ? ?  ? ? ?DISCUSSION: Patient is a 76 year old female scheduled for the above procedure. ? ?History includes never smoker, HTN, DM2 with neuropathy, hepatitis C (treated), OSA (uses CPAP), nocturnal seizures (1980's), obesity. ? ?Last cardiology evaluation with Dr. Audie Box was on 12/26/21 for palpitations/PACs (<1% 2021 event monitor) with unremarkable echo. Palpitations had resolved. On Zetia for HLD (statin intolerance). Continue same medications for HTN. He notes she has compression of her c-spine and was seeing orthopedics for this. As needed cardiology follow-up recommended.  ? ?Anesthesia team to evaluate on the day of surgery.  ? ? ?VS: BP 140/78   Pulse 75   Temp 36.5 ?C   Resp 19   Ht 5' 4" (1.626 m)   Wt 103.3 kg   SpO2 97%   BMI 39.08 kg/m?  ? ? ?PROVIDERS: ?[name], MD is PCP  ?Eleonore Chiquito, MD is cardiologist. As needed follow-up recommended at 12/26/21 office visitt.  ? ? ?LABS: Labs reviewed: Acceptable for surgery. ?(all labs ordered are listed, but only abnormal results are displayed) ? ?Labs Reviewed  ?GLUCOSE, CAPILLARY - Abnormal; Notable for the following components:  ?    Result Value  ? Glucose-Capillary 182 (*)   ? All other components within normal limits  ?HEMOGLOBIN A1C - Abnormal; Notable for the following components:  ? Hgb A1c MFr Bld 6.1 (*)   ? All other components within normal limits  ?COMPREHENSIVE METABOLIC PANEL - Abnormal; Notable for the following components:  ? Potassium 3.3 (*)   ? Glucose, Bld 163 (*)   ? Creatinine, Ser 1.29 (*)   ? ALT 54 (*)   ? GFR, Estimated 44 (*)   ? All other components within normal limits  ?SURGICAL PCR SCREEN  ?CBC  ? ? ?Home Sleep Test  03/09/19: ?1. Severe obstructive Sleep apnea at an AHI of 60.6/h and mildly accentuated in REM sleep.  ?2. Normal heart rate variability noted.  ?3. REM sleep accentuated hypoxic event index.  ? ? ?IMAGES: ?MRI C-spine 12/23/21: ?IMPRESSION: ?1. Canal stenosis is greatest at C4-C5 where it is moderate. ?Mild-to-moderate canal stenosis at C2-C3 and C6-C7 and multilevel ?mild canal stenosis as detailed above. ?2. Foraminal stenosis is greatest on the right at C4-C5 where it is ?severe. Multilevel moderate foraminal stenosis is detailed above. ?- ADDENDUM: ?Small focus of T2 hyperintensity within the left eccentric cord at ?C4-C5, which could represent mild edema and/or myelomalacia given ?disc protrusion at this level. Artifact is also a consideration ?given there appears to be some artifact in this region. ? ? ?EKG: 02/17/22: ?Normal sinus rhythm ?Left axis deviation ?Low voltage QRS ?Inferior infarct , age undetermined ?Possible Anterolateral infarct , age undetermined ?Abnormal ECG ?No significant change since last tracing ?Confirmed by Lauree Chandler [phone]) on 02/17/2022 5:30:52 PM ? ? ?CV: ?Echo 10/08/20: ?IMPRESSIONS  ? 1. Left ventricular ejection fraction, by estimation, is 60 to 65%. The  ?left ventricle has normal function. The left ventricle has no regional  ?wall motion abnormalities. Left ventricular diastolic parameters are  ?consistent with Grade I diastolic  ?dysfunction (impaired relaxation).  ? 2. Right ventricular systolic function is normal. The right ventricular  ?  size is normal.  ? 3. The mitral valve is normal in structure. No evidence of mitral valve  ?regurgitation. No evidence of mitral stenosis.  ? 4. The aortic valve is tricuspid. Aortic valve regurgitation is not  ?visualized. Mild aortic valve sclerosis is present, with no evidence of  ?aortic valve stenosis.  ? 5. The inferior vena cava is normal in size with greater than 50%  ?respiratory variability, suggesting right atrial pressure  of 3 mmHg.  ? ? ?Cardiac event monitor 07/31/20-08/29/20: ?Minimum heart rate 55 bpm (sinus bradycardia). Maximum heart rate 138 bpm (sinus tachycardia). Average heart rate 78 bpm (normal sinus rhythm). SVE was rare (4594; <1%). VE was rare (1192; <1%). No atrial fibrillation. Diary summarized below: ?  ?07/31/2020 2:46 PM: Symptoms of flutter/skipped beats coincided with normal sinus rhythm 91 bpm.  ?08/01/2020 2:37 PM: Symptoms of flutter/skipped beats coincided with normal sinus rhythm 86 bpm.  ?08/07/2020 3:07 AM: Symptoms of flutter or skipped beats coincided with normal sinus rhythm 94 bpm with PACs.  ?08/15/2020 6:33 PM: Symptoms of tired/fatigue, flutter/skipped beats coincided with sinus tachycardia 102 bpm.  ?  ?Impression: ?1. No significant arrhythmias.  ?2. Symptoms occurred with PACs which were rare (<1%).  ?  ? ?US Carotid 02/12/14: ?Summary:  ?Findings suggest 1-39% internal carotid artery stenosis  ?bilaterally. Unable to visualize bilateral vertebral  ?arteries due to technical limitations.   ? ? ?Past Medical History:  ?Diagnosis Date  ? Arthritis   ? Cataract   ? left cataract removed in 2017  ? Diabetes mellitus   ? Diabetic neuropathy ([place])   ? Diabetic neuropathy, type II diabetes mellitus ([place])   ? Hepatitis C   ? took tx for   ? Hypertension   ? Hypokalemia   ? Knee pain   ? right   ? Morbidly obese ([place])   ? Nocturnal seizures ([place]) 05/14/2015  ? no bad seizure since 1980's  ? OSA on CPAP   ? uses cpap 2-3 week  ? Plantar fasciitis   ? both feet  ? ? ?Past Surgical History:  ?Procedure Laterality Date  ? ABDOMINAL HYSTERECTOMY    ? partial  ? CATARACT EXTRACTION Left   ? COLONOSCOPY WITH PROPOFOL N/A 08/21/2015  ? Procedure: COLONOSCOPY WITH PROPOFOL;  Surgeon: Carol Ada, MD;  Location: [facility];  Service: Endoscopy;  Laterality: N/A;  ? ESOPHAGOGASTRODUODENOSCOPY (EGD) WITH PROPOFOL N/A 08/21/2015  ? Procedure: ESOPHAGOGASTRODUODENOSCOPY (EGD) WITH PROPOFOL;  Surgeon: Carol Ada,  MD;  Location: [facility];  Service: Endoscopy;  Laterality: N/A;  ? ESOPHAGOGASTRODUODENOSCOPY (EGD) WITH PROPOFOL N/A 10/08/2018  ? Procedure: ESOPHAGOGASTRODUODENOSCOPY (EGD) WITH PROPOFOL;  Surgeon: Carol Ada, MD;  Location: [facility];  Service: Endoscopy;  Laterality: N/A;  ? EYE SURGERY Bilateral   ? cataracts  ? TUBAL LIGATION    ? ? ?MEDICATIONS: ? acetaminophen (TYLENOL) 500 MG tablet  ? amLODipine (NORVASC) 10 MG tablet  ? Blood Glucose Monitoring Suppl (ONE TOUCH ULTRA 2) w/Device KIT  ? colchicine 0.6 MG tablet  ? diclofenac Sodium (VOLTAREN) 1 % GEL  ? empagliflozin (JARDIANCE) 10 MG TABS tablet  ? ezetimibe (ZETIA) 10 MG tablet  ? furosemide (LASIX) 40 MG tablet  ? Insulin Aspart, w/Niacinamide, (FIASP) 100 UNIT/ML SOLN  ? levETIRAcetam (KEPPRA) 750 MG tablet  ? metFORMIN (GLUCOPHAGE) 500 MG tablet  ? montelukast (SINGULAIR) 10 MG tablet  ? ONE TOUCH ULTRA TEST test strip  ? ONETOUCH DELICA LANCETS 43P MISC  ? phenazopyridine (PYRIDIUM) 100 MG tablet  ? Potassium  Chloride ER 20 MEQ TBCR  ? pregabalin (LYRICA) 300 MG capsule  ? sucralfate (CARAFATE) 1 G tablet  ? telmisartan (MICARDIS) 80 MG tablet  ? ?No current facility-administered medications for this encounter.  ? ? ?Myra Gianotti, PA-C ?Surgical Short Stay/Anesthesiology ?[facility] Phone [phone] ?[facility] Phone [phone] ?02/18/2022 2:45 PM ? ? ? ? ? ? ?

## 2022-02-22 IMAGING — US US ABDOMEN LIMITED
1 series · 14 of 25 positions shown · non-contrast
Comparison: 07/09/2020

CLINICAL DATA: Hepatitis-C

EXAM:
ULTRASOUND ABDOMEN LIMITED RIGHT UPPER QUADRANT

[Series 1: us abdomen limited · 0.23mm/px · 14 of 49 slices shown]
[im 1/49]
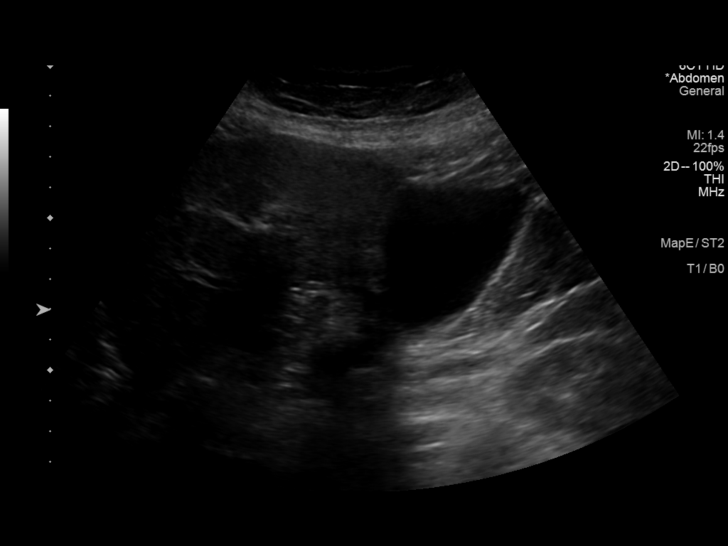
[im 5/49]
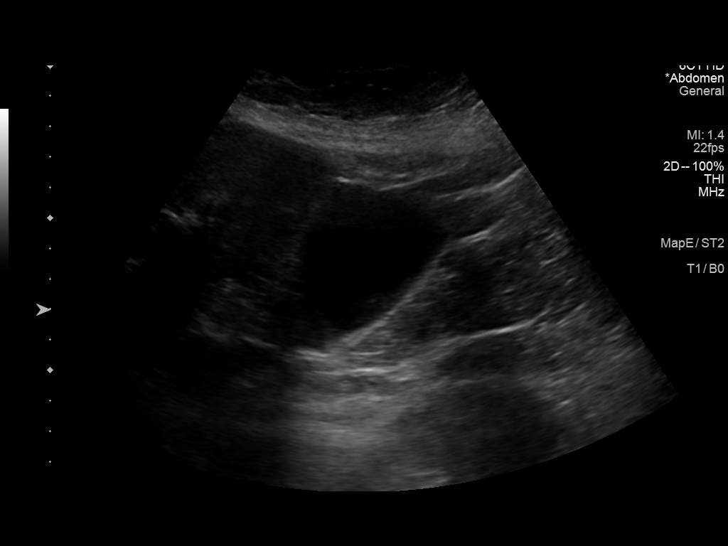
[im 9/49]
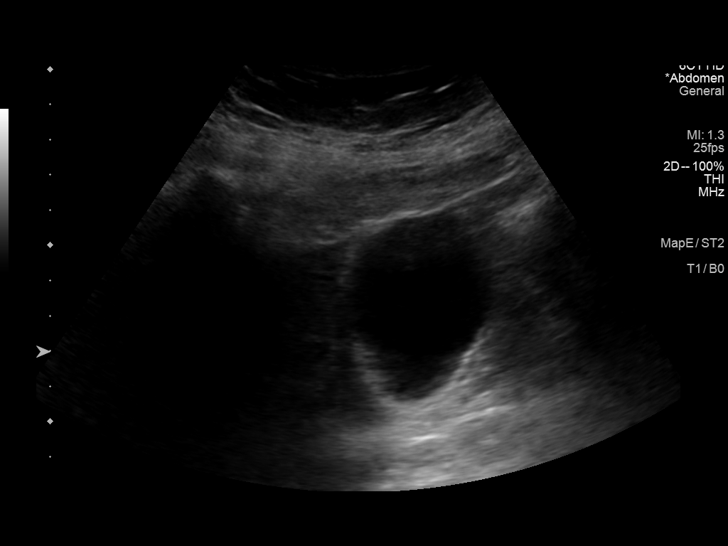
[im 13/49]
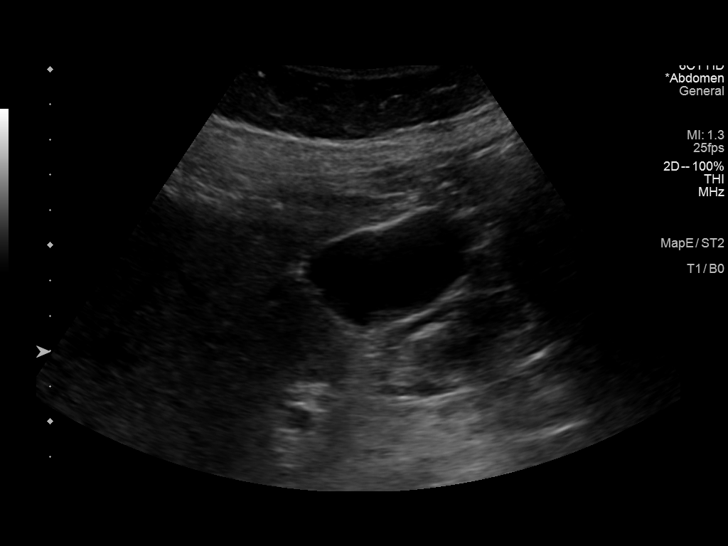
[im 17/49]
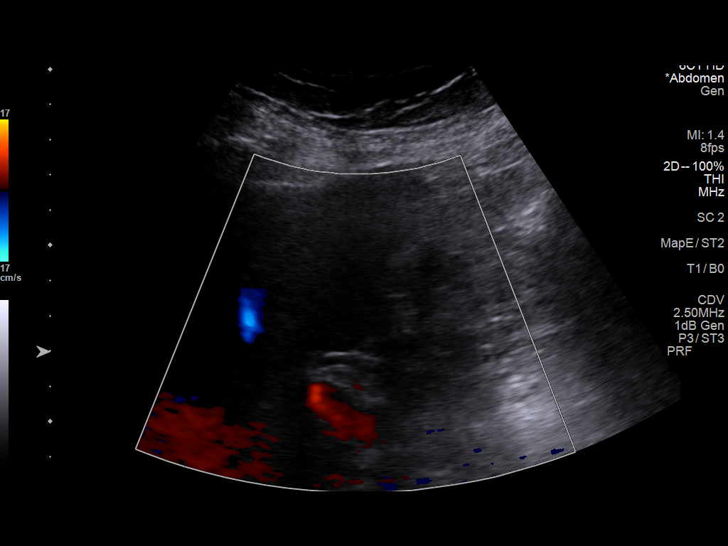
[im 19/49]
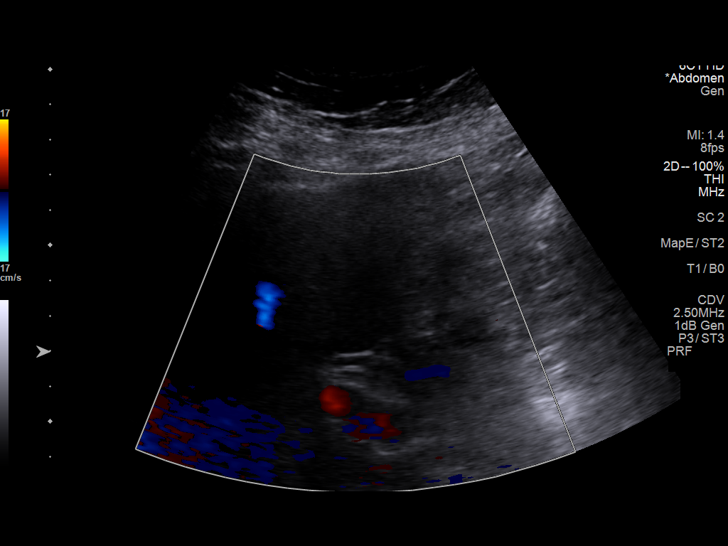
[im 23/49]
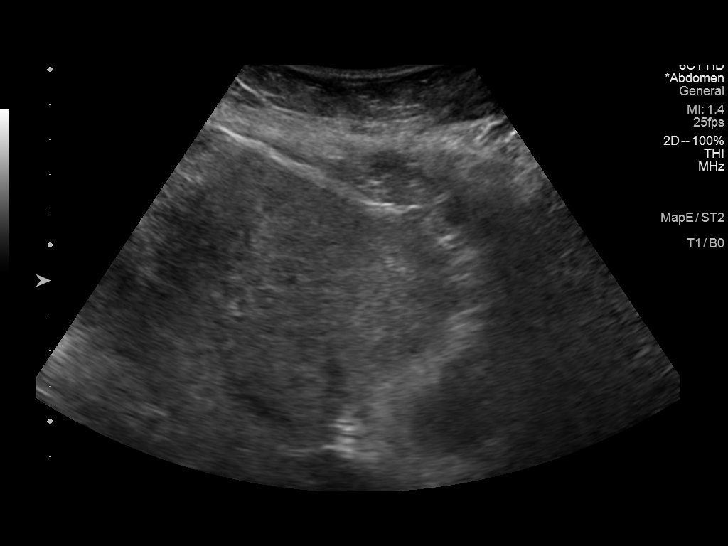
[im 27/49]
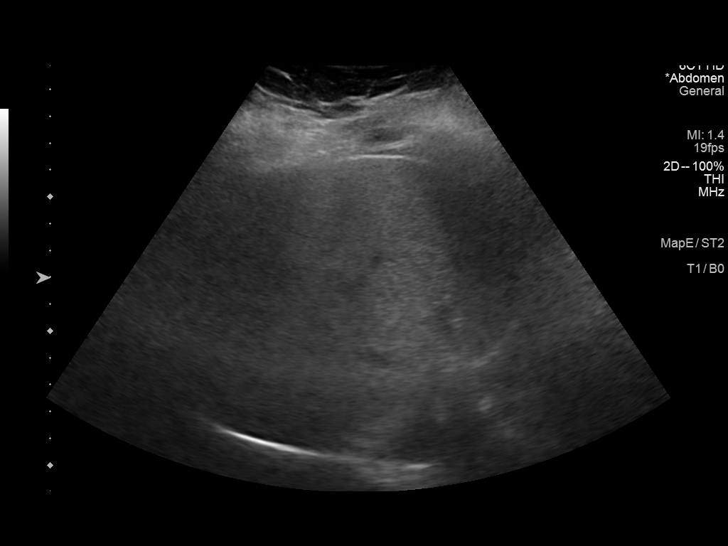
[im 31/49]
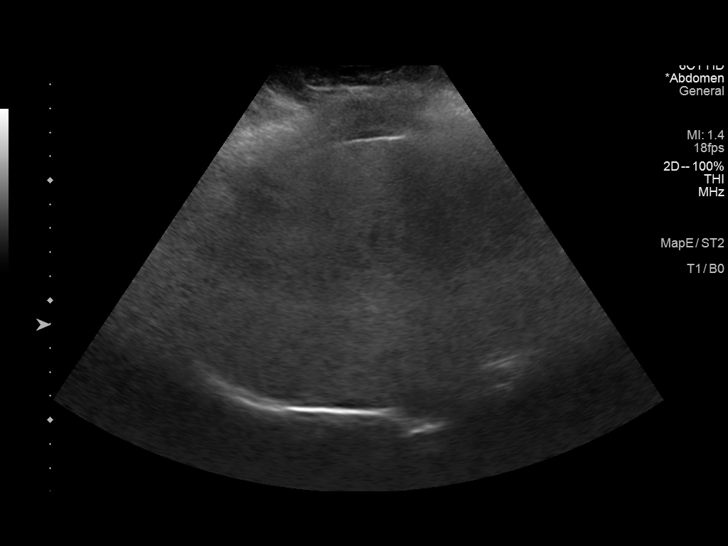
[im 33/49]
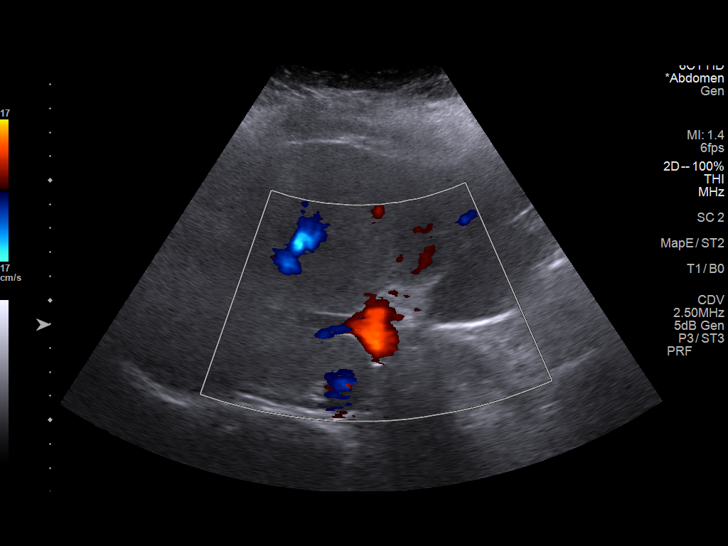
[im 37/49]
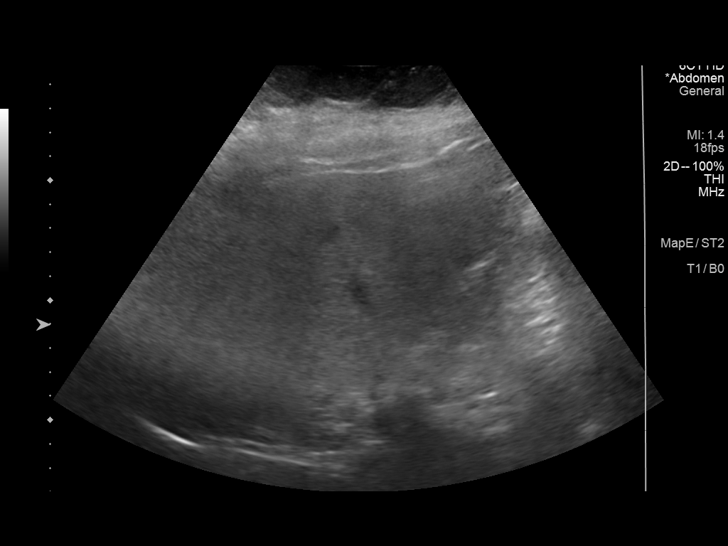
[im 41/49]
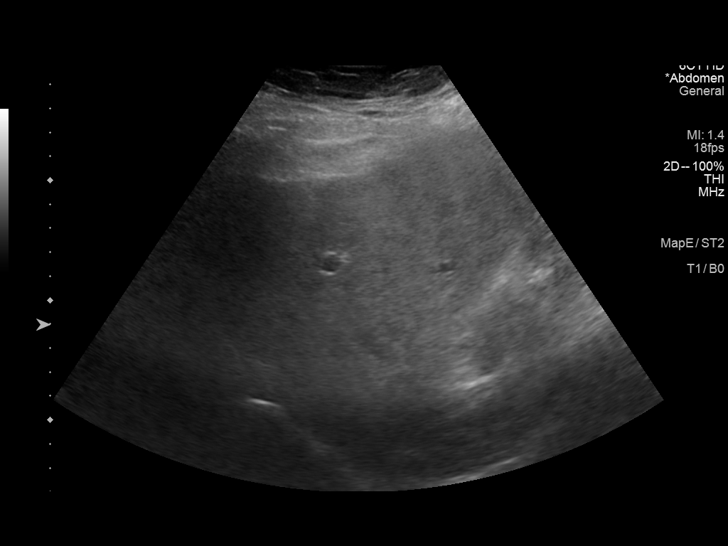
[im 45/49]
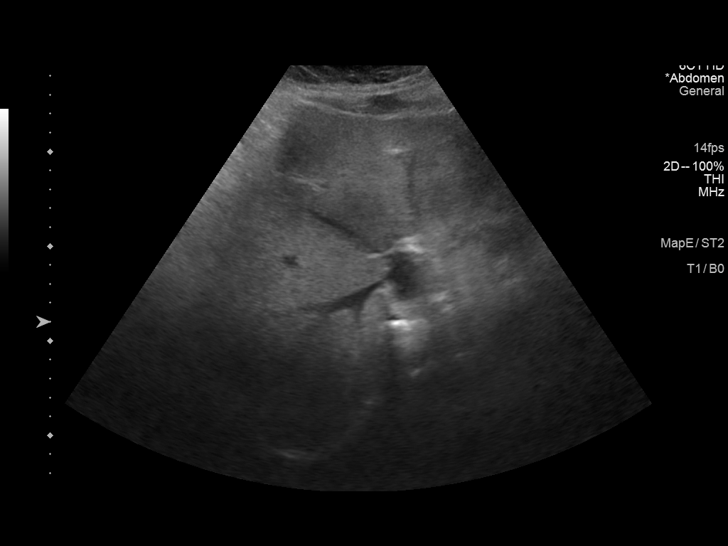
[im 49/49]
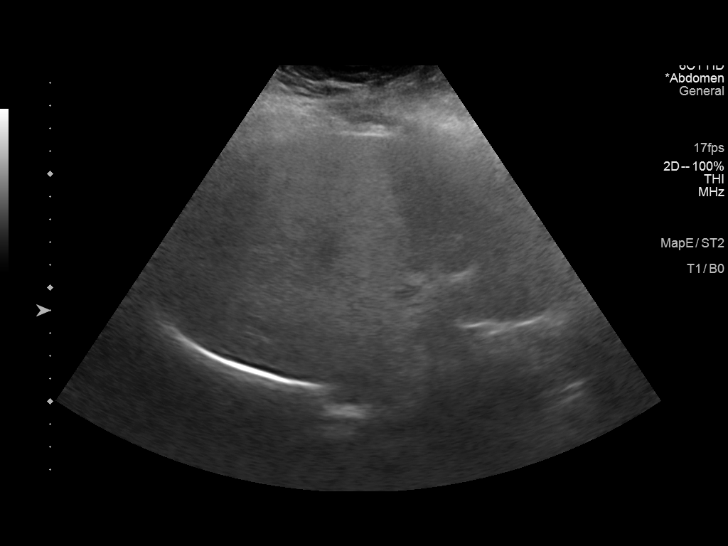

[14 of 25 positions shown; findings below may reference images not displayed]

FINDINGS: Gallbladder:

No gallstones or wall thickening visualized. No sonographic Murphy
sign noted by sonographer.

Common bile duct:

Diameter: 3 mm

Liver:

No focal lesion.

Diffusely increased parenchymal echogenicity.

Portal vein is patent on color Doppler imaging with normal direction
of blood flow towards the liver.

Other: None.
IMPRESSION: Diffuse increased echogenicity of the hepatic parenchyma is a
nonspecific indicator of hepatocellular dysfunction, most commonly
steatosis.

## 2022-02-24 ENCOUNTER — Encounter (HOSPITAL_COMMUNITY): Payer: Self-pay | Admitting: Orthopaedic Surgery

## 2022-02-24 ENCOUNTER — Other Ambulatory Visit: Payer: Self-pay

## 2022-02-24 ENCOUNTER — Ambulatory Visit (HOSPITAL_BASED_OUTPATIENT_CLINIC_OR_DEPARTMENT_OTHER): Payer: HMO | Admitting: Anesthesiology

## 2022-02-24 ENCOUNTER — Encounter (HOSPITAL_COMMUNITY)
Admission: RE | Disposition: A | Discharge status: Home / Self Care | Payer: Self-pay | Source: Home / Self Care | Attending: Orthopaedic Surgery

## 2022-02-24 ENCOUNTER — Ambulatory Visit (HOSPITAL_COMMUNITY): Payer: HMO | Admitting: Vascular Surgery

## 2022-02-24 ENCOUNTER — Ambulatory Visit (HOSPITAL_COMMUNITY): Payer: HMO

## 2022-02-24 ENCOUNTER — Observation Stay (HOSPITAL_COMMUNITY)
Admission: RE | Admit: 2022-02-24 | Discharge: 2022-02-25 | Disposition: A | Discharge status: Home / Self Care | Payer: HMO | Attending: Orthopaedic Surgery | Admitting: Orthopaedic Surgery

## 2022-02-24 DIAGNOSIS — E119 Type 2 diabetes mellitus without complications: Secondary | ICD-10-CM

## 2022-02-24 DIAGNOSIS — E1140 Type 2 diabetes mellitus with diabetic neuropathy, unspecified: Secondary | ICD-10-CM | POA: Insufficient documentation

## 2022-02-24 DIAGNOSIS — G9519 Other vascular myelopathies: Secondary | ICD-10-CM | POA: Diagnosis not present

## 2022-02-24 DIAGNOSIS — Z9989 Dependence on other enabling machines and devices: Secondary | ICD-10-CM | POA: Diagnosis not present

## 2022-02-24 DIAGNOSIS — Z981 Arthrodesis status: Secondary | ICD-10-CM | POA: Diagnosis not present

## 2022-02-24 DIAGNOSIS — R937 Abnormal findings on diagnostic imaging of other parts of musculoskeletal system: Secondary | ICD-10-CM | POA: Insufficient documentation

## 2022-02-24 DIAGNOSIS — K0889 Other specified disorders of teeth and supporting structures: Secondary | ICD-10-CM | POA: Insufficient documentation

## 2022-02-24 DIAGNOSIS — Z7984 Long term (current) use of oral hypoglycemic drugs: Secondary | ICD-10-CM | POA: Diagnosis not present

## 2022-02-24 DIAGNOSIS — Z6837 Body mass index (BMI) 37.0-37.9, adult: Secondary | ICD-10-CM | POA: Insufficient documentation

## 2022-02-24 DIAGNOSIS — Z8249 Family history of ischemic heart disease and other diseases of the circulatory system: Secondary | ICD-10-CM | POA: Diagnosis not present

## 2022-02-24 DIAGNOSIS — G4733 Obstructive sleep apnea (adult) (pediatric): Secondary | ICD-10-CM | POA: Insufficient documentation

## 2022-02-24 DIAGNOSIS — I10 Essential (primary) hypertension: Secondary | ICD-10-CM | POA: Insufficient documentation

## 2022-02-24 DIAGNOSIS — R569 Unspecified convulsions: Secondary | ICD-10-CM | POA: Diagnosis not present

## 2022-02-24 DIAGNOSIS — K219 Gastro-esophageal reflux disease without esophagitis: Secondary | ICD-10-CM | POA: Diagnosis not present

## 2022-02-24 DIAGNOSIS — M50121 Cervical disc disorder at C4-C5 level with radiculopathy: Secondary | ICD-10-CM | POA: Insufficient documentation

## 2022-02-24 DIAGNOSIS — Z833 Family history of diabetes mellitus: Secondary | ICD-10-CM | POA: Insufficient documentation

## 2022-02-24 DIAGNOSIS — Z01818 Encounter for other preprocedural examination: Secondary | ICD-10-CM

## 2022-02-24 DIAGNOSIS — G9589 Other specified diseases of spinal cord: Secondary | ICD-10-CM | POA: Diagnosis not present

## 2022-02-24 DIAGNOSIS — M4802 Spinal stenosis, cervical region: Principal | ICD-10-CM | POA: Insufficient documentation

## 2022-02-24 DIAGNOSIS — M1990 Unspecified osteoarthritis, unspecified site: Secondary | ICD-10-CM | POA: Insufficient documentation

## 2022-02-24 DIAGNOSIS — E1136 Type 2 diabetes mellitus with diabetic cataract: Secondary | ICD-10-CM | POA: Diagnosis not present

## 2022-02-24 DIAGNOSIS — M4322 Fusion of spine, cervical region: Secondary | ICD-10-CM | POA: Diagnosis not present

## 2022-02-24 DIAGNOSIS — Z8619 Personal history of other infectious and parasitic diseases: Secondary | ICD-10-CM | POA: Diagnosis not present

## 2022-02-24 DIAGNOSIS — Z794 Long term (current) use of insulin: Secondary | ICD-10-CM

## 2022-02-24 DIAGNOSIS — R2689 Other abnormalities of gait and mobility: Secondary | ICD-10-CM | POA: Diagnosis not present

## 2022-02-24 DIAGNOSIS — M6281 Muscle weakness (generalized): Secondary | ICD-10-CM | POA: Insufficient documentation

## 2022-02-24 DIAGNOSIS — Z79899 Other long term (current) drug therapy: Secondary | ICD-10-CM | POA: Diagnosis not present

## 2022-02-24 HISTORY — PX: ANTERIOR CERVICAL DECOMP/DISCECTOMY FUSION: SHX1161

## 2022-02-24 LAB — GLUCOSE, CAPILLARY
Glucose-Capillary: 90 mg/dL (ref 70–99)
Glucose-Capillary: 84 mg/dL (ref 70–99)
Glucose-Capillary: 139 mg/dL — ABNORMAL HIGH (ref 70–99)
Glucose-Capillary: 205 mg/dL — ABNORMAL HIGH (ref 70–99)
Glucose-Capillary: 157 mg/dL — ABNORMAL HIGH (ref 70–99)

## 2022-02-24 SURGERY — ANTERIOR CERVICAL DECOMPRESSION/DISCECTOMY FUSION 1 LEVEL
Anesthesia: General | Site: Spine Cervical

## 2022-02-24 MED ORDER — PREGABALIN 100 MG PO CAPS
300.0000 mg | ORAL_CAPSULE | Freq: Two times a day (BID) | ORAL | Status: DC
  Administered 2022-02-24 – 2022-02-25 (×2): 300 mg via ORAL
  Filled 2022-02-24 (×2): qty 3

## 2022-02-24 MED ORDER — BUPIVACAINE-EPINEPHRINE 0.5% -1:200000 IJ SOLN
INTRAMUSCULAR | Status: DC | PRN
  Administered 2022-02-24: 6 mL

## 2022-02-24 MED ORDER — SURGIFLO WITH THROMBIN (HEMOSTATIC MATRIX KIT) OPTIME
TOPICAL | Status: DC | PRN
Start: 2022-02-24 — End: 2022-02-24
  Administered 2022-02-24: 1 via TOPICAL

## 2022-02-24 MED ORDER — IRBESARTAN 300 MG PO TABS
300.0000 mg | ORAL_TABLET | Freq: Every day | ORAL | Status: DC
  Administered 2022-02-25: 300 mg via ORAL
  Filled 2022-02-24: qty 1

## 2022-02-24 MED ORDER — HYDROMORPHONE HCL 1 MG/ML IJ SOLN: 0.5000 mg | INTRAMUSCULAR | Status: DC | PRN

## 2022-02-24 MED ORDER — CEFAZOLIN SODIUM-DEXTROSE 2-3 GM-%(50ML) IV SOLR
INTRAVENOUS | Status: DC | PRN
  Administered 2022-02-24: 2 g via INTRAVENOUS

## 2022-02-24 MED ORDER — ONDANSETRON HCL 4 MG/2ML IJ SOLN
INTRAMUSCULAR | Status: AC
  Filled 2022-02-24: qty 2

## 2022-02-24 MED ORDER — LACTATED RINGERS IV SOLN: INTRAVENOUS | Status: DC

## 2022-02-24 MED ORDER — POLYETHYLENE GLYCOL 3350 17 G PO PACK
17.0000 g | PACK | Freq: Every day | ORAL | Status: DC
  Administered 2022-02-25: 17 g via ORAL
  Filled 2022-02-24: qty 1

## 2022-02-24 MED ORDER — PHENYLEPHRINE HCL-NACL 20-0.9 MG/250ML-% IV SOLN
INTRAVENOUS | Status: DC | PRN
  Administered 2022-02-24: 50 ug/min via INTRAVENOUS
  Administered 2022-02-24: 30 ug/min via INTRAVENOUS

## 2022-02-24 MED ORDER — AMLODIPINE BESYLATE 5 MG PO TABS
5.0000 mg | ORAL_TABLET | Freq: Every day | ORAL | Status: DC
  Administered 2022-02-25: 5 mg via ORAL
  Filled 2022-02-24: qty 1

## 2022-02-24 MED ORDER — PHENOL 1.4 % MT LIQD: 1.0000 | OROMUCOSAL | Status: DC | PRN

## 2022-02-24 MED ORDER — OXYCODONE HCL 5 MG/5ML PO SOLN: 5.0000 mg | Freq: Once | ORAL | Status: DC | PRN

## 2022-02-24 MED ORDER — PHENAZOPYRIDINE HCL 100 MG PO TABS: 100.0000 mg | ORAL_TABLET | Freq: Three times a day (TID) | ORAL | Status: DC | PRN

## 2022-02-24 MED ORDER — PHENYLEPHRINE HCL (PRESSORS) 10 MG/ML IV SOLN
INTRAVENOUS | Status: DC | PRN
  Administered 2022-02-24: 80 ug via INTRAVENOUS

## 2022-02-24 MED ORDER — PROPOFOL 10 MG/ML IV BOLUS
INTRAVENOUS | Status: DC | PRN
Start: 2022-02-24 — End: 2022-02-24
  Administered 2022-02-24: 100 mg via INTRAVENOUS

## 2022-02-24 MED ORDER — LEVETIRACETAM 750 MG PO TABS
1500.0000 mg | ORAL_TABLET | Freq: Two times a day (BID) | ORAL | Status: DC
  Administered 2022-02-24 – 2022-02-25 (×2): 1500 mg via ORAL
  Filled 2022-02-24 (×4): qty 2

## 2022-02-24 MED ORDER — HYDROMORPHONE HCL 1 MG/ML IJ SOLN
INTRAMUSCULAR | Status: AC
  Filled 2022-02-24: qty 1

## 2022-02-24 MED ORDER — CEFAZOLIN SODIUM-DEXTROSE 2-4 GM/100ML-% IV SOLN
2.0000 g | INTRAVENOUS | Status: DC
  Filled 2022-02-24: qty 100

## 2022-02-24 MED ORDER — ONDANSETRON HCL 4 MG/2ML IJ SOLN: 4.0000 mg | Freq: Four times a day (QID) | INTRAMUSCULAR | Status: DC | PRN

## 2022-02-24 MED ORDER — INSULIN ASPART 100 UNIT/ML IJ SOLN
0.0000 [IU] | Freq: Three times a day (TID) | INTRAMUSCULAR | Status: DC
  Administered 2022-02-24: 7 [IU] via SUBCUTANEOUS
  Administered 2022-02-25: 4 [IU] via SUBCUTANEOUS

## 2022-02-24 MED ORDER — SODIUM CHLORIDE 0.9 % IV SOLN: 250.0000 mL | INTRAVENOUS | Status: DC

## 2022-02-24 MED ORDER — SODIUM CHLORIDE 0.9% FLUSH
3.0000 mL | Freq: Two times a day (BID) | INTRAVENOUS | Status: DC
  Administered 2022-02-24: 3 mL via INTRAVENOUS

## 2022-02-24 MED ORDER — EMPAGLIFLOZIN 10 MG PO TABS
10.0000 mg | ORAL_TABLET | Freq: Every day | ORAL | Status: DC
  Administered 2022-02-25: 10 mg via ORAL
  Filled 2022-02-24: qty 1

## 2022-02-24 MED ORDER — MONTELUKAST SODIUM 10 MG PO TABS
10.0000 mg | ORAL_TABLET | Freq: Every day | ORAL | Status: DC
  Administered 2022-02-24: 10 mg via ORAL
  Filled 2022-02-24: qty 1

## 2022-02-24 MED ORDER — SUCRALFATE 1 G PO TABS
1.0000 g | ORAL_TABLET | Freq: Two times a day (BID) | ORAL | Status: DC
  Administered 2022-02-24 – 2022-02-25 (×2): 1 g via ORAL
  Filled 2022-02-24 (×2): qty 1

## 2022-02-24 MED ORDER — METHOCARBAMOL 1000 MG/10ML IJ SOLN
500.0000 mg | Freq: Four times a day (QID) | INTRAVENOUS | Status: DC | PRN
  Filled 2022-02-24: qty 5

## 2022-02-24 MED ORDER — ACETAMINOPHEN 325 MG PO TABS
650.0000 mg | ORAL_TABLET | ORAL | Status: DC | PRN
Start: 2022-02-24 — End: 2022-02-25
  Administered 2022-02-24 – 2022-02-25 (×3): 650 mg via ORAL
  Filled 2022-02-24 (×3): qty 2

## 2022-02-24 MED ORDER — MIDAZOLAM HCL 5 MG/5ML IJ SOLN
INTRAMUSCULAR | Status: DC | PRN
  Administered 2022-02-24: 1 mg via INTRAVENOUS

## 2022-02-24 MED ORDER — METFORMIN HCL 500 MG PO TABS: 500.0000 mg | ORAL_TABLET | Freq: Every day | ORAL | Status: DC

## 2022-02-24 MED ORDER — DEXAMETHASONE SODIUM PHOSPHATE 10 MG/ML IJ SOLN
INTRAMUSCULAR | Status: DC | PRN
  Administered 2022-02-24: 10 mg via INTRAVENOUS

## 2022-02-24 MED ORDER — METHOCARBAMOL 500 MG PO TABS
500.0000 mg | ORAL_TABLET | Freq: Four times a day (QID) | ORAL | 0 refills | Status: DC | PRN
Start: 2022-02-24 — End: 2022-05-05

## 2022-02-24 MED ORDER — CHLORHEXIDINE GLUCONATE 0.12 % MT SOLN
15.0000 mL | Freq: Once | OROMUCOSAL | Status: AC
  Administered 2022-02-24: 15 mL via OROMUCOSAL
  Filled 2022-02-24: qty 15

## 2022-02-24 MED ORDER — DEXAMETHASONE SODIUM PHOSPHATE 10 MG/ML IJ SOLN
INTRAMUSCULAR | Status: AC
  Filled 2022-02-24: qty 1

## 2022-02-24 MED ORDER — ACETAMINOPHEN 650 MG RE SUPP: 650.0000 mg | RECTAL | Status: DC | PRN

## 2022-02-24 MED ORDER — INSULIN ASPART 100 UNIT/ML IJ SOLN: 0.0000 [IU] | INTRAMUSCULAR | Status: DC | PRN

## 2022-02-24 MED ORDER — DOCUSATE SODIUM 100 MG PO CAPS
100.0000 mg | ORAL_CAPSULE | Freq: Two times a day (BID) | ORAL | Status: DC
  Administered 2022-02-24 – 2022-02-25 (×2): 100 mg via ORAL
  Filled 2022-02-24 (×2): qty 1

## 2022-02-24 MED ORDER — ROCURONIUM BROMIDE 10 MG/ML (PF) SYRINGE
PREFILLED_SYRINGE | INTRAVENOUS | Status: AC
  Filled 2022-02-24: qty 10

## 2022-02-24 MED ORDER — EZETIMIBE 10 MG PO TABS
10.0000 mg | ORAL_TABLET | Freq: Every day | ORAL | Status: DC
  Administered 2022-02-24: 10 mg via ORAL
  Filled 2022-02-24 (×2): qty 1

## 2022-02-24 MED ORDER — LIDOCAINE 2% (20 MG/ML) 5 ML SYRINGE
INTRAMUSCULAR | Status: DC | PRN
  Administered 2022-02-24: 60 mg via INTRAVENOUS

## 2022-02-24 MED ORDER — ONDANSETRON HCL 4 MG/2ML IJ SOLN
INTRAMUSCULAR | Status: DC | PRN
Start: 2022-02-24 — End: 2022-02-24
  Administered 2022-02-24: 4 mg via INTRAVENOUS

## 2022-02-24 MED ORDER — ONDANSETRON HCL 4 MG PO TABS: 4.0000 mg | ORAL_TABLET | Freq: Four times a day (QID) | ORAL | Status: DC | PRN

## 2022-02-24 MED ORDER — OXYCODONE HCL 5 MG PO TABS
5.0000 mg | ORAL_TABLET | ORAL | Status: DC | PRN
  Administered 2022-02-24 – 2022-02-25 (×3): 5 mg via ORAL
  Filled 2022-02-24 (×3): qty 1

## 2022-02-24 MED ORDER — FENTANYL CITRATE (PF) 250 MCG/5ML IJ SOLN
INTRAMUSCULAR | Status: DC | PRN
  Administered 2022-02-24 (×3): 25 ug via INTRAVENOUS
  Administered 2022-02-24: 50 ug via INTRAVENOUS

## 2022-02-24 MED ORDER — MENTHOL 3 MG MT LOZG
1.0000 | LOZENGE | OROMUCOSAL | Status: DC | PRN
  Administered 2022-02-25: 3 mg via ORAL
  Filled 2022-02-24: qty 9

## 2022-02-24 MED ORDER — FUROSEMIDE 40 MG PO TABS: 40.0000 mg | ORAL_TABLET | Freq: Every day | ORAL | Status: DC

## 2022-02-24 MED ORDER — MIDAZOLAM HCL 2 MG/2ML IJ SOLN
INTRAMUSCULAR | Status: AC
  Filled 2022-02-24: qty 2

## 2022-02-24 MED ORDER — AMISULPRIDE (ANTIEMETIC) 5 MG/2ML IV SOLN: 10.0000 mg | Freq: Once | INTRAVENOUS | Status: DC | PRN

## 2022-02-24 MED ORDER — 0.9 % SODIUM CHLORIDE (POUR BTL) OPTIME
TOPICAL | Status: DC | PRN
  Administered 2022-02-24: 1000 mL

## 2022-02-24 MED ORDER — ONDANSETRON HCL 4 MG/2ML IJ SOLN: 4.0000 mg | Freq: Once | INTRAMUSCULAR | Status: DC | PRN

## 2022-02-24 MED ORDER — SUGAMMADEX SODIUM 200 MG/2ML IV SOLN
INTRAVENOUS | Status: DC | PRN
  Administered 2022-02-24: 200 mg via INTRAVENOUS

## 2022-02-24 MED ORDER — ORAL CARE MOUTH RINSE: 15.0000 mL | Freq: Once | OROMUCOSAL | Status: AC

## 2022-02-24 MED ORDER — LIDOCAINE 2% (20 MG/ML) 5 ML SYRINGE
INTRAMUSCULAR | Status: AC
  Filled 2022-02-24: qty 5

## 2022-02-24 MED ORDER — ROCURONIUM BROMIDE 10 MG/ML (PF) SYRINGE
PREFILLED_SYRINGE | INTRAVENOUS | Status: DC | PRN
  Administered 2022-02-24: 100 mg via INTRAVENOUS

## 2022-02-24 MED ORDER — OXYCODONE HCL 5 MG PO TABS: 5.0000 mg | ORAL_TABLET | Freq: Once | ORAL | Status: DC | PRN

## 2022-02-24 MED ORDER — ACETAMINOPHEN 500 MG PO TABS
1000.0000 mg | ORAL_TABLET | Freq: Once | ORAL | Status: AC
  Administered 2022-02-24: 1000 mg via ORAL
  Filled 2022-02-24: qty 2

## 2022-02-24 MED ORDER — OXYCODONE-ACETAMINOPHEN 5-325 MG PO TABS: 1.0000 | ORAL_TABLET | Freq: Four times a day (QID) | ORAL | 0 refills | Status: DC | PRN

## 2022-02-24 MED ORDER — PROPOFOL 10 MG/ML IV BOLUS
INTRAVENOUS | Status: AC
  Filled 2022-02-24: qty 20

## 2022-02-24 MED ORDER — POTASSIUM CHLORIDE CRYS ER 20 MEQ PO TBCR
20.0000 meq | EXTENDED_RELEASE_TABLET | Freq: Every day | ORAL | Status: DC
  Administered 2022-02-25: 20 meq via ORAL
  Filled 2022-02-24: qty 1

## 2022-02-24 MED ORDER — SODIUM CHLORIDE 0.9% FLUSH: 3.0000 mL | INTRAVENOUS | Status: DC | PRN

## 2022-02-24 MED ORDER — SODIUM CHLORIDE 0.9 % IV SOLN: INTRAVENOUS | Status: DC

## 2022-02-24 MED ORDER — BUPIVACAINE-EPINEPHRINE 0.5% -1:200000 IJ SOLN
INTRAMUSCULAR | Status: AC
  Filled 2022-02-24: qty 1

## 2022-02-24 MED ORDER — METHOCARBAMOL 500 MG PO TABS
500.0000 mg | ORAL_TABLET | Freq: Four times a day (QID) | ORAL | Status: DC | PRN
  Administered 2022-02-24 – 2022-02-25 (×2): 500 mg via ORAL
  Filled 2022-02-24 (×2): qty 1

## 2022-02-24 MED ORDER — HYDROMORPHONE HCL 1 MG/ML IJ SOLN
0.2500 mg | INTRAMUSCULAR | Status: DC | PRN
  Administered 2022-02-24: 0.25 mg via INTRAVENOUS
  Administered 2022-02-24: 0.5 mg via INTRAVENOUS
  Administered 2022-02-24 (×2): 0.25 mg via INTRAVENOUS

## 2022-02-24 MED ORDER — FENTANYL CITRATE (PF) 250 MCG/5ML IJ SOLN
INTRAMUSCULAR | Status: AC
  Filled 2022-02-24: qty 5

## 2022-02-24 SURGICAL SUPPLY — 55 items
AGENT HMST KT MTR STRL THRMB (HEMOSTASIS)
APL SKNCLS STERI-STRIP NONHPOA (GAUZE/BANDAGES/DRESSINGS) ×2
BAG COUNTER SPONGE SURGICOUNT (BAG) ×4 IMPLANT
BAG SPNG CNTER NS LX DISP (BAG) ×2
BENZOIN TINCTURE PRP APPL 2/3 (GAUZE/BANDAGES/DRESSINGS) ×4 IMPLANT
BIT DRILL W/STOP SM 12 (BIT) ×2 IMPLANT
BLADE CLIPPER SURG (BLADE) IMPLANT
BONE CC-ACS 11X14 X8 6D (Bone Implant) ×3 IMPLANT
BUR ROUND FLUTED 4 SOFT TCH (BURR) ×4 IMPLANT
CHIPS BONE CANC-ACS 11X14X8 6D (Bone Implant) ×1 IMPLANT
COLLAR CERV LO CONTOUR FIRM DE (SOFTGOODS) ×4 IMPLANT
COVER MAYO STAND STRL (DRAPES) ×4 IMPLANT
COVER SURGICAL LIGHT HANDLE (MISCELLANEOUS) ×4 IMPLANT
DRAPE C-ARM 42X72 X-RAY (DRAPES) ×4 IMPLANT
DRAPE HALF SHEET 40X57 (DRAPES) ×4 IMPLANT
DRAPE MICROSCOPE LEICA (MISCELLANEOUS) ×4 IMPLANT
DURAPREP 6ML APPLICATOR 50/CS (WOUND CARE) ×4 IMPLANT
ELECT COATED BLADE 2.86 ST (ELECTRODE) ×4 IMPLANT
ELECT REM PT RETURN 9FT ADLT (ELECTROSURGICAL) ×3
ELECTRODE REM PT RTRN 9FT ADLT (ELECTROSURGICAL) ×3 IMPLANT
EVACUATOR 1/8 PVC DRAIN (DRAIN) ×4 IMPLANT
GAUZE SPONGE 4X4 12PLY STRL (GAUZE/BANDAGES/DRESSINGS) ×4 IMPLANT
GLOVE BIOGEL PI IND STRL 8 (GLOVE) ×6 IMPLANT
GLOVE BIOGEL PI INDICATOR 8 (GLOVE) ×2
GLOVE ORTHO TXT STRL SZ7.5 (GLOVE) ×8 IMPLANT
GOWN STRL REUS W/ TWL LRG LVL3 (GOWN DISPOSABLE) ×3 IMPLANT
GOWN STRL REUS W/ TWL XL LVL3 (GOWN DISPOSABLE) ×3 IMPLANT
GOWN STRL REUS W/TWL 2XL LVL3 (GOWN DISPOSABLE) ×4 IMPLANT
GOWN STRL REUS W/TWL LRG LVL3 (GOWN DISPOSABLE) ×3
GOWN STRL REUS W/TWL XL LVL3 (GOWN DISPOSABLE) ×3
HALTER HD/CHIN CERV TRACTION D (MISCELLANEOUS) ×4 IMPLANT
KIT BASIN OR (CUSTOM PROCEDURE TRAY) ×4 IMPLANT
KIT TURNOVER KIT B (KITS) ×4 IMPLANT
MANIFOLD NEPTUNE II (INSTRUMENTS) ×2 IMPLANT
NDL 25GX 5/8IN NON SAFETY (NEEDLE) ×2 IMPLANT
NEEDLE 25GX 5/8IN NON SAFETY (NEEDLE) ×3 IMPLANT
NS IRRIG 1000ML POUR BTL (IV SOLUTION) ×4 IMPLANT
PACK ORTHO CERVICAL (CUSTOM PROCEDURE TRAY) ×4 IMPLANT
PAD ARMBOARD 7.5X6 YLW CONV (MISCELLANEOUS) ×8 IMPLANT
PATTIES SURGICAL .5 X.5 (GAUZE/BANDAGES/DRESSINGS) IMPLANT
PIN FIXATION SMALL TEMP (ORTHOPEDIC DISPOSABLE SUPPLIES) ×2 IMPLANT
PLATE ANT CERV XTEND 1 LV 16 (Plate) ×2 IMPLANT
POSITIONER HEAD DONUT 9IN (MISCELLANEOUS) ×4 IMPLANT
SCREW XTD VAR 4.2 SELF TAP 12 (Screw) ×8 IMPLANT
SPONGE T-LAP 4X18 ~~LOC~~+RFID (SPONGE) ×4 IMPLANT
STRIP CLOSURE SKIN 1/2X4 (GAUZE/BANDAGES/DRESSINGS) ×4 IMPLANT
SURGIFLO W/THROMBIN 8M KIT (HEMOSTASIS) IMPLANT
SUT BONE WAX W31G (SUTURE) ×4 IMPLANT
SUT VIC AB 3-0 PS2 18 (SUTURE) ×3
SUT VIC AB 3-0 PS2 18XBRD (SUTURE) ×3 IMPLANT
SUT VIC AB 4-0 PS2 27 (SUTURE) ×4 IMPLANT
SYR BULB EAR ULCER 3OZ GRN STR (SYRINGE) ×4 IMPLANT
TOWEL GREEN STERILE (TOWEL DISPOSABLE) ×4 IMPLANT
TOWEL GREEN STERILE FF (TOWEL DISPOSABLE) ×4 IMPLANT
WATER STERILE IRR 1000ML POUR (IV SOLUTION) ×2 IMPLANT

## 2022-02-24 NOTE — Progress Notes (Signed)
Orthopedic Tech Progress Note Patient Details:  Cathy Gonzalez [birth year] [id number]  Extra soft collar for home when showering   Ortho Devices Type of Ortho Device: Soft collar Ortho Device/Splint Interventions: Ordered   Post Interventions Patient Tolerated: Well Instructions Provided: Care of [place] 02/24/2022, 5:20 PM

## 2022-02-24 NOTE — Progress Notes (Signed)
Pt placed on CPAP w/home mask. RT will cont to monitor as needed.

## 2022-02-24 NOTE — Interval H&P Note (Signed)
History and Physical Interval Note:  02/24/2022 12:18 PM  Cathy Gonzalez  has presented today for surgery, with the diagnosis of C4-5 STENOSIS.  The various methods of treatment have been discussed with the patient and family. After consideration of risks, benefits and other options for treatment, the patient has consented to  Procedure(s): C4-5 ANTERIOR CERVICAL DECOMPRESSION/DISCECTOMY FUSION 1 LEVEL (N/A) ALLOGRAFT PLATE (N/A) as a surgical intervention.  The patient's history has been reviewed, patient examined, no change in status, stable for surgery.  I have reviewed the patient's chart and labs.  Questions were answered to the patient's satisfaction.     [name]

## 2022-02-24 NOTE — H&P (Signed)
Cathy Gonzalez is an 76 y.o. female.   Chief Complaint: neck pain and UE radiculopathy HPI: 76 year old black female history of C4-5 HNP/stenosis comes in for preop evaluation.  States neck pain and upper extremity radiculopathy unchanged from previous visit.  She is wanting to proceed with C4-5 ACDF is scheduled.  Today history and physical performed.  Review of systems positive for seizures "at night when I sleep".  History of sleep apnea and uses CPAP.     Patient followed by cardiologist Dr. Davina Poke, neurologist Dr. Jannifer Franklin and PCP Dr. Criss Rosales.  Dr. Lorin Mercy did not request preop clearances from these providers.  Surgical procedure discussed.  All questions answered.  Advised patient to bring in her face mask and hose for the CPAP.  We will need this in the hospital.  Past Medical History:  Diagnosis Date   Arthritis    Cataract    left cataract removed in 2017   Diabetes mellitus    Diabetic neuropathy ([place])    Diabetic neuropathy, type II diabetes mellitus ([place])    Hepatitis C    took tx for    Hypertension    Hypokalemia    Knee pain    right    Morbidly obese (Cathy Gonzalez)    Nocturnal seizures ([place]) 05/14/2015   no bad seizure since 1980's   OSA on CPAP    uses cpap 2-3 week   Plantar fasciitis    both feet    Past Surgical History:  Procedure Laterality Date   ABDOMINAL HYSTERECTOMY     partial   CATARACT EXTRACTION Left    COLONOSCOPY WITH PROPOFOL N/A 08/21/2015   Procedure: COLONOSCOPY WITH PROPOFOL;  Surgeon: Carol Ada, MD;  Location: [facility];  Service: Endoscopy;  Laterality: N/A;   ESOPHAGOGASTRODUODENOSCOPY (EGD) WITH PROPOFOL N/A 08/21/2015   Procedure: ESOPHAGOGASTRODUODENOSCOPY (EGD) WITH PROPOFOL;  Surgeon: Carol Ada, MD;  Location: [facility];  Service: Endoscopy;  Laterality: N/A;   ESOPHAGOGASTRODUODENOSCOPY (EGD) WITH PROPOFOL N/A 10/08/2018   Procedure: ESOPHAGOGASTRODUODENOSCOPY (EGD) WITH PROPOFOL;  Surgeon: Carol Ada, MD;  Location: [facility];   Service: Endoscopy;  Laterality: N/A;   EYE SURGERY Bilateral    cataracts   TUBAL LIGATION      Family History  Problem Relation Age of Onset   Diabetes Mother    Heart attack Mother    Diabetes Sister    Diabetes Brother    Cancer Sister        breast   Breast cancer Sister    Seizures Neg Hx    Social History:  reports that she has never smoked. She has never used smokeless tobacco. She reports that she does not drink alcohol and does not use drugs.  Allergies:  Allergies  Allergen Reactions   Acetaminophen     Other reaction(s): GI upset, constipation if taken regularly    No medications prior to admission.    No results found for this or any previous visit (from the past 48 hour(s)). No results found.  Review of Systems  Constitutional:  Positive for activity change.  HENT: Negative.    Respiratory:  Positive for apnea.   Cardiovascular: Negative.   Gastrointestinal: Negative.   Musculoskeletal:  Positive for neck pain.  Psychiatric/Behavioral: Negative.     There were no vitals taken for this visit. Physical Exam Constitutional:      Appearance: She is obese.  HENT:     Head: Normocephalic and atraumatic.     Nose: Nose normal.  Eyes:  Extraocular Movements: Extraocular movements intact.  Cardiovascular:     Rate and Rhythm: Regular rhythm.  Pulmonary:     Effort: No respiratory distress.     Breath sounds: Normal breath sounds.  Abdominal:     General: Bowel sounds are normal.  Neurological:     Mental Status: She is alert and oriented to person, place, and time.  Psychiatric:        Mood and Affect: Mood normal.     Assessment/Plan C4-5 HNP/stenosis  Will proceed with surgery as scheduled.  All questions answered.   Benjiman Core, PA-C 02/24/2022, 8:36 AM

## 2022-02-24 NOTE — Anesthesia Procedure Notes (Signed)
Procedure Name: Intubation Date/Time: 02/24/2022 12:55 PM Performed by: Maude Leriche, CRNA Pre-anesthesia Checklist: Patient identified, Emergency Drugs available, Suction available and Patient being monitored Patient Re-evaluated:Patient Re-evaluated prior to induction Oxygen Delivery Method: Circle system utilized Preoxygenation: Pre-oxygenation with 100% oxygen Induction Type: IV induction Ventilation: Mask ventilation without difficulty Laryngoscope Size: Glidescope and 3 Grade View: Grade I Tube type: Oral Tube size: 7.0 mm Number of attempts: 1 Airway Equipment and Method: Stylet, Video-laryngoscopy and Bite block Placement Confirmation: ETT inserted through vocal cords under direct vision, positive ETCO2 and breath sounds checked- equal and bilateral Secured at: 22 cm Tube secured with: Tape Dental Injury: Teeth and Oropharynx as per pre-operative assessment  Comments: Elective glidescope for c-spine stabilization

## 2022-02-24 NOTE — Transfer of Care (Signed)
Immediate Anesthesia Transfer of Care Note  Patient: Cathy Gonzalez  Procedure(s) Performed: CERVICAL FOUR- CERVICAL FIVE ANTERIOR CERVICAL DECOMPRESSION/DISCECTOMY FUSION ONE LEVEL WITH ALLOGRAFT AND PLATE (Spine Cervical)  Patient Location: PACU  Anesthesia Type:General  Level of Consciousness: awake  Airway & Oxygen Therapy: Patient Spontanous Breathing and Patient connected to face mask oxygen  Post-op Assessment: Report given to RN and Post -op Vital signs reviewed and stable  Post vital signs: Reviewed and stable  Last Vitals:  Vitals Value Taken Time  BP 151/83 02/24/22 1507  Temp    Pulse 80 02/24/22 1509  Resp 21 02/24/22 1509  SpO2 95 % 02/24/22 1509  Vitals shown include unvalidated device data.  Last Pain:  Vitals:   02/24/22 1120  TempSrc:   PainSc: 0-No pain         Complications: No notable events documented.

## 2022-02-24 NOTE — Op Note (Signed)
Plan postop diagnosis: C4-5 cervical stenosis with cord edema early myelomalacia changes.  Procedure: C4-5 anterior cervical discectomy and fusion, allograft and plate.  Surgeon: Rodell Perna MD  Assistant: Benjiman Core, PA-C medically necessary and present for the entire procedure  Anesthesia: General +6 cc Marcaine local.  EBL: Less than 100 cc  Implants:SCREW XTD VAR 4.2 SELF TAP 12 - [id number]  Inventory Item: SCREW XTD VAR 4.2 SELF TAP 12 Serial no.:  Model/Cat no.: [id number]  Implant name: SCREW XTD VAR 4.2 SELF TAP 12 - [id number] Laterality: N/A Area: Cervical Level 4-5  Manufacturer: [place] Date of Manufacture:    Action: Implanted Number Used: 4   Device Identifier:  Device Identifier Type:     PLATE ANT CERV XTEND 1 LV 16 - [id number]  Inventory Item: PLATE ANT CERV XTEND 1 LV 16 Serial no.:  Model/Cat no.: [id number]  Implant name: PLATE ANT CERV XTEND 1 LV 16 - [id number] Laterality: N/A Area: Cervical Level 4-5  Manufacturer: [place] Date of Manufacture:    Action: Implanted Number Used: 1   Device Identifier:  Device Identifier Type:     BONE CC-ACS 11X14 X8 6D - [id number]  Inventory Item: BONE CC-ACS 11X14 X8 6D Serial no.: [id number] Model/Cat no.: [id number]  Implant name: BONE CC-ACS 11X14 X8 6D - [id number] Laterality: N/A Area: Cervical Level 4-5  Manufacturer: Gerilyn Nestle FNDN Date of Manufacture:    Action: Implanted Number Used: 1   Device Identifier:  Device Identifier Type:     Procedure: After standard prepping and draping after intubation with a glide scope due to her cervical stenosis head altered traction was applied wrist restraints for pulldown during C-arm visualization calf pump wrist preoperative Ancef prophylaxis.  Neck was prepped with DuraPrep area squared with towels sterile skin marker Betadine Steri-Drape sterile Mayo stand the head thyroid sheets and drapes.  Timeout procedure was completed.  Incision was made  starting at the midline extending the left based on palpable landmarks platysma was split in line with the fibers blunt dissection below the omohyoid down the spur at C5 4-5 and C5-6.  25 short needle was placed at that came in underneath the spur and then the anterior spurs were resected and a second image was taken after chunk of disc was removed with the [place] 4 sitting in the disc base between 4 and 5.  Once this was confirmed self-retaining retractors were placed and the operative microscope was draped brought in and we proceeded to take down the anterior spurs do a discectomy and then we used the microscope 1 and 2 mm Kerrisons resect the posterior longitudinal ligament with decompression.  We did some the endplates and ended up with an 8 mm graft and there was a little bit more stenosis inferior than cephalad.  Dura was completely decompressed uncovertebral joints were stripped.  Trial sizers for 6 7 and then an 8 trials were inserted 8 was tight.  Traction was pulled by the CRNA and some additional spur removal based on findings on the C arm and then placement of the graft countersinking at 1 mm.  16 mm plate globus was selected held with the spike screw was inserted and then the plate was adjusted 4 screws were placed with a screw at the midline.  The inferior screws and 5 were angled down both were then bone in satisfactory position graft was stable epidural space was dry.  Some Surgiflo been placed just to make sure that there was no epidural bleeding  since patient had cervical stenosis and had some cord edema changes on MRI scan preoperatively that when along with her hand tingling in legs and feet tingling.  Operative field was dry Hemovac was placed in and out technique in line with skin incision 3-0 Vicryl in the platysma 4-0 Vicryl subcuticular closure tincture benzoin Steri-Strips Marcaine infiltration 4 x 4's tape and soft collar with collar extender was applied patient tolerated procedure well  transferred recovery in stable condition.

## 2022-02-24 NOTE — Anesthesia Preprocedure Evaluation (Addendum)
Anesthesia Evaluation  Patient identified by MRN, date of birth, ID band Patient awake    Reviewed: Allergy & Precautions, NPO status , Patient's Chart, lab work & pertinent test results  Airway Mallampati: III  TM Distance: >3 FB Neck ROM: Full    Dental  (+) Teeth Intact, Dental Advisory Given, Poor Dentition   Pulmonary sleep apnea (semi-compliant w/ CPAP) and Continuous Positive Airway Pressure Ventilation ,    Pulmonary exam normal breath sounds clear to auscultation       Cardiovascular hypertension (158/84 in preop, per pt normally 120SBP), Pt. on medications Normal cardiovascular exam Rhythm:Regular Rate:Normal     Neuro/Psych Seizures - (1980s), Well Controlled,  negative psych ROS   GI/Hepatic GERD  Controlled,(+) Hepatitis - (treated), C  Endo/Other  diabetes, Well Controlled, Type 2, Oral Hypoglycemic Agents, Insulin DependentObesity BMI 38 FS 90 in preop  Renal/GU Renal InsufficiencyRenal diseaseCr 1.29  negative genitourinary   Musculoskeletal  (+) Arthritis , Osteoarthritis,    Abdominal (+) + obese,   Peds  Hematology negative hematology ROS (+)   Anesthesia Other Findings   Reproductive/Obstetrics negative OB ROS                            Anesthesia Physical Anesthesia Plan  ASA: 3  Anesthesia Plan: General   Post-op Pain Management: Tylenol PO (pre-op)*   Induction: Intravenous  PONV Risk Score and Plan: 3 and Ondansetron, Dexamethasone, Midazolam and Treatment may vary due to age or medical condition  Airway Management Planned: Oral ETT and Video Laryngoscope Planned  Additional Equipment: None  Intra-op Plan:   Post-operative Plan: Extubation in OR  Informed Consent: I have reviewed the patients History and Physical, chart, labs and discussed the procedure including the risks, benefits and alternatives for the proposed anesthesia with the patient or  authorized representative who has indicated his/her understanding and acceptance.     Dental advisory given  Plan Discussed with: CRNA  Anesthesia Plan Comments:        Anesthesia Quick Evaluation

## 2022-02-24 NOTE — Discharge Instructions (Addendum)
Ok to shower 5 days postop. PER DR Jaydence Vanyo DO NOT REMOVE DRESSING THAT HE HAS APPLIED.   Cervical collar must be on at all times including when showering.  No aggressive activity.  Do not bend or turn neck.     No lifting, pushing, pulling  No driving    Call Your Doctor If Any of These Occur Redness, drainage, or swelling at the wound.  Temperature greater than 101 degrees. Severe pain not relieved by pain medication. Increased difficulty swallowing.  Incision starts to come apart.

## 2022-02-24 NOTE — Anesthesia Postprocedure Evaluation (Signed)
Anesthesia Post Note  Patient: Neysa N Spring  Procedure(s) Performed: CERVICAL FOUR- CERVICAL FIVE ANTERIOR CERVICAL DECOMPRESSION/DISCECTOMY FUSION ONE LEVEL WITH ALLOGRAFT AND PLATE (Spine Cervical)     Patient location during evaluation: PACU Anesthesia Type: General Level of consciousness: awake and alert, oriented and patient cooperative Pain management: pain level controlled Vital Signs Assessment: post-procedure vital signs reviewed and stable Respiratory status: spontaneous breathing, nonlabored ventilation and respiratory function stable Cardiovascular status: blood pressure returned to baseline and stable Postop Assessment: no apparent nausea or vomiting Anesthetic complications: no   No notable events documented.  Last Vitals:  Vitals:   02/24/22 1507 02/24/22 1522  BP: (!) 151/83 (!) 169/82  Pulse: 81 77  Resp: 19 (!) 21  Temp: 36.5 C   SpO2: 93% 94%    Last Pain:  Vitals:   02/24/22 1529  TempSrc:   PainSc: [place]

## 2022-02-25 ENCOUNTER — Encounter (HOSPITAL_COMMUNITY): Payer: Self-pay | Admitting: Orthopaedic Surgery

## 2022-02-25 ENCOUNTER — Telehealth: Payer: Self-pay | Admitting: Orthopaedic Surgery

## 2022-02-25 DIAGNOSIS — M4802 Spinal stenosis, cervical region: Secondary | ICD-10-CM | POA: Diagnosis not present

## 2022-02-25 LAB — GLUCOSE, CAPILLARY: Glucose-Capillary: 156 mg/dL — ABNORMAL HIGH (ref 70–99)

## 2022-02-25 NOTE — Evaluation (Signed)
Occupational Therapy Evaluation Patient Details Name: Cathy Gonzalez MRN: [id number] DOB: 01-12-1946 Today's Date: 02/25/2022   History of Present Illness Pt is a 76 y/o female presenting on 5/22 s/p C4-5 ACDF. PMH includes: arthritis, obesity, seizures, OSA on CPAP, DM 2, hepatitis C.   Clinical Impression   PTA patient independent and driving. Admitted for above and presents with problem list below. Pt educated on cervical precautions, brace wear schedule and use, ADL compensatory techniques, recommendations for DME, safety and activity progression. Pt completes ADLs with up to min assist, transfers and mobility using RW with supervision. Pt will have support of son initially at dc. No further OT needs identified at this time.  OT will sign off.      Recommendations for follow up therapy are one component of a multi-disciplinary discharge planning process, led by the attending physician.  Recommendations may be updated based on patient status, additional functional criteria and insurance authorization.   Follow Up Recommendations  No OT follow up    Assistance Recommended at Discharge Intermittent Supervision/Assistance  Patient can return home with the following A little help with bathing/dressing/bathroom;Assistance with cooking/housework;Assist for transportation    Functional Status Assessment     Equipment Recommendations  BSC/3in1    Recommendations for Other Services       Precautions / Restrictions Precautions Precautions: Cervical Precaution Booklet Issued: Yes (comment) Precaution Comments: reviewed with pt Required Braces or Orthoses: Cervical Brace Cervical Brace: Soft collar Restrictions Weight Bearing Restrictions: No      Mobility Bed Mobility Overal bed mobility: Needs Assistance Bed Mobility: Rolling, Sidelying to Sit, Sit to Sidelying Rolling: Supervision Sidelying to sit: Supervision     Sit to sidelying: Supervision General bed mobility comments:  cueing for log roll technique, plan to sleep in recliner    Transfers Overall transfer level: Needs assistance Equipment used: Rolling walker (2 wheels) Transfers: Sit to/from Stand Sit to Stand: Supervision           General transfer comment: cueing for hand placement, safety with RW      Balance Overall balance assessment: Mild deficits observed, not formally tested                                         ADL either performed or assessed with clinical judgement   ADL Overall ADL's : Needs assistance/impaired     Grooming: Modified independent;Standing       Lower Body Bathing: Supervison/ safety;Sit to/from stand Lower Body Bathing Details (indicate cue type and reason): educated on safety and bathing sitting, using figure 4 technique Upper Body Dressing : Minimal assistance;Sitting Upper Body Dressing Details (indicate cue type and reason): to fully bring over collar Lower Body Dressing: Supervision/safety;Sit to/from stand   Toilet Transfer: Supervision/safety;Ambulation;Rolling walker (2 wheels)   Toileting- Clothing Manipulation and Hygiene: Modified independent;Sit to/from stand   Tub/ Shower Transfer: Supervision/safety;Tub transfer;Ambulation Tub/Shower Transfer Details (indicate cue type and reason): simulated in room, has grabbar to assist stepping over threshold.  will have support from family as needed. Educated on using 3:1 as shower chair for safety. Functional mobility during ADLs: Supervision/safety;Rolling walker (2 wheels) General ADL Comments: reviewed precautions, brace wear schedule and ADl compesnatory techniques     Vision   Vision Assessment?: No apparent visual deficits     Perception     Praxis      Pertinent Vitals/Pain Pain Assessment Pain  Assessment: Faces Faces Pain Scale: Hurts a little bit Pain Location: incisional- neck Pain Descriptors / Indicators: Operative site guarding, Sore Pain Intervention(s):  Limited activity within patient's tolerance, Monitored during session, Repositioned     Hand Dominance Right   Extremity/Trunk Assessment Upper Extremity Assessment Upper Extremity Assessment: Overall WFL for tasks assessed (within cervical precautions)   Lower Extremity Assessment Lower Extremity Assessment: Overall WFL for tasks assessed (reports hx of R knee buckling, wears knee brace)   Cervical / Trunk Assessment Cervical / Trunk Assessment: Neck Surgery   Communication Communication Communication: No difficulties   Cognition Arousal/Alertness: Awake/alert Behavior During Therapy: WFL for tasks assessed/performed Overall Cognitive Status: Within Functional Limits for tasks assessed                                       General Comments       Exercises     Shoulder Instructions      Home Living Family/patient expects to be discharged to:: Private residence Living Arrangements: Alone Available Help at Discharge: Family;Available 24 hours/day Type of Home: House Home Access: Stairs to enter [employer] of Steps: 2 Entrance Stairs-Rails:  (+ rail) Home Layout: One level     Bathroom Shower/Tub: Teacher, early years/pre: Standard     Home Equipment: Conservation officer, nature (2 wheels);Grab bars - tub/shower   Additional Comments: will have son assist initally      Prior Functioning/Environment Prior Level of Function : Independent/Modified Independent;Driving;Working/employed               ADLs Comments: Scientist, water quality at [employer]        OT Problem List: Decreased activity tolerance;Pain;Decreased knowledge of precautions;Decreased knowledge of use of DME or AE;Decreased safety awareness;Obesity      OT Treatment/Interventions:      OT Goals(Current goals can be found in the care plan section) Acute Rehab OT Goals Patient Stated Goal: home OT Goal Formulation: With patient  OT Frequency:      Co-evaluation               AM-PAC OT "6 Clicks" Daily Activity     Outcome Measure Help from another person eating meals?: None Help from another person taking care of personal grooming?: A Little Help from another person toileting, which includes using toliet, bedpan, or urinal?: None Help from another person bathing (including washing, rinsing, drying)?: A Little Help from another person to put on and taking off regular upper body clothing?: A Little Help from another person to put on and taking off regular lower body clothing?: A Little 6 Click Score: 20   End of Session Equipment Utilized During Treatment: Cervical collar Nurse Communication: Mobility status  Activity Tolerance: Patient tolerated treatment well Patient left: in bed;with call bell/phone within reach  OT Visit Diagnosis: Other abnormalities of gait and mobility (R26.89);Muscle weakness (generalized) (M62.81);Pain Pain - part of body:  (neck- incisonal)                Time: 1740-8144 OT Time Calculation (min): 30 min Charges:  OT General Charges $OT Visit: 1 Visit OT Evaluation $OT Eval Low Complexity: 1 Low OT Treatments $Self Care/Home Management : 8-22 mins  Jolaine Artist, OT Acute Rehabilitation Services Pager [phone] Office [phone]   Cathy Gonzalez 02/25/2022, 9:32 AM

## 2022-02-25 NOTE — Telephone Encounter (Signed)
Per patient's son, her insurance will pay for custodial care. He is asking for rx to be sent to [facility] for this so someone can help his mom bathe and fix small meals. OK for rx?

## 2022-02-25 NOTE — Progress Notes (Signed)
Patient awaiting to be transported to her vehicle for discharge home; in no acute distress nor complaints of pain nor discomfort; moves all  extremities well; incision on her anterior neck with honeycomb dressing and is clean, dry and intact with soft collar on; room was checked for all belongings and were taken along by son on the way to his vehicle; discharge instructions concerning her medications, incision care, follow up appointment and when to call the doctor as needed were all discussed to patient and her son by RN and both verbalized understanding on the instructions given.

## 2022-02-25 NOTE — Plan of Care (Signed)

## 2022-02-25 NOTE — TOC Transition Note (Signed)
Transition of Care [facility]) - CM/SW Discharge Note   Patient Details  Name: Cathy Gonzalez MRN: [id number] Date of Birth: 05-16-1946  [facility] CM/SW Contact:  Pollie Friar, RN Phone Number: 02/25/2022, 9:42 AM   Clinical Narrative:    Patient discharging home with self care. No f/u per OT.  Pt has transportation home.    Final next level of care: Home/Self Care Barriers to Discharge: No Barriers Identified   Patient Goals and CMS Choice        Discharge Placement                       Discharge Plan and Services                                     Social Determinants of Health (SDOH) Interventions     Readmission Risk Interventions     View : No data to display.

## 2022-02-25 NOTE — Progress Notes (Signed)
  Transition of Care [facility]) Screening Note   Patient Details  Name: Cathy Gonzalez Date of Birth: [birth year]   Transition of Care [facility]) CM/SW Contact:    Geralynn Ochs, LCSW Phone Number: 02/25/2022, 8:22 AM    Transition of Care Department [facility]) has reviewed patient and no TOC needs have been identified at this time. We will continue to monitor patient advancement through interdisciplinary progression rounds. If new patient transition needs arise, please place a TOC consult.

## 2022-02-25 NOTE — Progress Notes (Signed)
Patient ID: Cathy Gonzalez, female   DOB: [birth year], 76 y.o.   MRN: [id number]   Subjective: 1 Day Post-Op Procedure(s) (LRB): CERVICAL FOUR- CERVICAL FIVE ANTERIOR CERVICAL DECOMPRESSION/DISCECTOMY FUSION ONE LEVEL WITH ALLOGRAFT AND PLATE (N/A) Patient reports pain as mild.    Objective: Vital signs in last 24 hours: Temp:  [97.6 F (36.4 C)-98 F (36.7 C)] 98 F (36.7 C) (05/23 0510) Pulse Rate:  [68-83] 73 (05/23 0510) Resp:  [17-28] 20 (05/23 0510) BP: (132-169)/(60-88) 153/86 (05/23 0510) SpO2:  [92 %-100 %] 95 % (05/23 0510) Weight:  [99.8 kg] 99.8 kg (05/22 1043)  Intake/Output from previous day: 05/22 0701 - 05/23 0700 In: 800 [I.V.:800] Out: 90 [Drains:40; Blood:50] Intake/Output this shift: No intake/output data recorded.  No results for input(s): HGB in the last 72 hours. No results for input(s): WBC, RBC, HCT, PLT in the last 72 hours. No results for input(s): NA, K, CL, CO2, BUN, CREATININE, GLUCOSE, CALCIUM in the last 72 hours. No results for input(s): LABPT, INR in the last 72 hours.   PE:  walking in hall with walker. Sensation legs improved.  DG Cervical Spine 2 or 3 views  Result Date: 02/24/2022 CLINICAL DATA:  C4-C5 fusion. EXAM: CERVICAL SPINE - 2 VIEW COMPARISON:  Prior study FINDINGS: Two intraoperative spot views of the cervical spine are submitted postoperatively for interpretation. Anterior plate screw fixation and interbody fusion material at C4-C5 noted. No gross complicating features noted. IMPRESSION: Anterior fusion at C4-C5 without gross complicating features. Electronically Signed   By: Margarette Canada M.D.   On: 02/24/2022 14:47   DG C-Arm 1-60 Min-No Report  Result Date: 02/24/2022 Fluoroscopy was utilized by the requesting physician.  No radiographic interpretation.   DG C-Arm 1-60 Min-No Report  Result Date: 02/24/2022 Fluoroscopy was utilized by the requesting physician.  No radiographic interpretation.    Assessment/Plan: 1 Day Post-Op  Procedure(s) (LRB): CERVICAL FOUR- CERVICAL FIVE ANTERIOR CERVICAL DECOMPRESSION/DISCECTOMY FUSION ONE LEVEL WITH ALLOGRAFT AND PLATE (N/A) Plan: discharge home. Office one week.   Cathy Gonzalez 02/25/2022, 7:40 AM

## 2022-02-25 NOTE — Telephone Encounter (Signed)
Pt son called and pt is getting discharged today and she is going to need some at home care. Can we please contact him about this?   [phone]

## 2022-02-26 NOTE — Telephone Encounter (Signed)
noted 

## 2022-03-04 ENCOUNTER — Ambulatory Visit (INDEPENDENT_AMBULATORY_CARE_PROVIDER_SITE_OTHER): Payer: HMO

## 2022-03-04 ENCOUNTER — Encounter: Payer: Self-pay | Admitting: Orthopaedic Surgery

## 2022-03-04 ENCOUNTER — Ambulatory Visit (INDEPENDENT_AMBULATORY_CARE_PROVIDER_SITE_OTHER): Payer: HMO | Admitting: Orthopaedic Surgery

## 2022-03-04 VITALS — BP 155/95 | HR 86 | Ht 64.0 in | Wt 220.0 lb

## 2022-03-04 DIAGNOSIS — Z981 Arthrodesis status: Secondary | ICD-10-CM | POA: Diagnosis not present

## 2022-03-04 NOTE — Progress Notes (Signed)
Post-Op Visit Note   Patient: Cathy Gonzalez           Date of Birth: 07/11/1946           MRN: [id number] Visit Date: 03/04/2022 PCP: [name], MD   Assessment & Plan: Post single level cervical fusion 8 days postop.  Prevertebral space is increased from 19 to 26 mm with postop swelling.  She states she had some problems swallowing yogurt but now is eating better.  She has been sleeping in a recliner preop hand symptoms are improved and she states that she already notices that her gait is better and she is walking better than she was before surgery.  Continue collar return 5 weeks for lateral flexion-extension.  Work slip given no work x6 weeks    Chief Complaint:  Chief Complaint  Patient presents with   Neck - Routine Post Op    02/24/2022 C4-5 ACDF   Visit Diagnoses:  1. Status post cervical spinal fusion     Plan: Return 5 weeks for lateral flexion-extension C-spine x-rays.  Follow-Up Instructions: Return in about 5 weeks (around 04/08/2022).   Orders:  Orders Placed This Encounter  Procedures   XR Cervical Spine 2 or 3 views   No orders of the defined types were placed in this encounter.   Imaging: No results found.  PMFS History: Patient Active Problem List   Diagnosis Date Noted   Cervical stenosis of spine 02/24/2022   Spinal stenosis of cervical region 01/03/2022   Neck pain 12/03/2021   Abnormal liver function tests 02/12/2021   Diarrhea 02/12/2021   Gastro-esophageal reflux disease without esophagitis 62/83/1517   Periumbilical pain 61/60/7371   Personal history of colonic polyps 02/12/2021   Portal hypertension ([place]) 02/12/2021   Screening for malignant neoplasm of colon 02/12/2021   Type 2 diabetes mellitus without complications ([place]) 03/31/9484   Primary osteoarthritis, left hand 11/07/2020   Arthritis of carpometacarpal (CMC) joint of thumb 07/18/2020   Bilateral primary osteoarthritis of knee 06/20/2020   Bilateral primary osteoarthritis of hip  06/07/2020   Right thigh pain 05/09/2020   Low back pain 07/14/2019   OSA on CPAP 05/16/2019   Diabetic neuropathy associated with type 2 diabetes mellitus ([place]) 12/13/2018   Chronic pain of right knee 12/09/2018   Thumb pain, left 12/09/2018   Dizziness and giddiness 01/14/2016   Hepatic cirrhosis ([place]) 07/26/2015   Nocturnal seizures ([place]) 05/14/2015   Chronic hepatitis C without hepatic coma ([place]) 03/14/2015   Vertigo, central 02/11/2014   Difficulty walking 02/11/2014   Diabetes mellitus ([place]) 02/11/2014   Dysphagia 02/11/2014   OBESITY 07/26/2010   Essential hypertension, benign 07/26/2010   DEGENERATIVE JOINT DISEASE, GENERALIZED 07/26/2010   Past Medical History:  Diagnosis Date   Arthritis    Cataract    left cataract removed in 2017   Diabetes mellitus    Diabetic neuropathy (HCC)    Diabetic neuropathy, type II diabetes mellitus ([place])    Hepatitis C    took tx for    Hypertension    Hypokalemia    Knee pain    right    Morbidly obese (HCC)    Nocturnal seizures ([place]) 05/14/2015   no bad seizure since 1980's   OSA on CPAP    uses cpap 2-3 week   Plantar fasciitis    both feet    Family History  Problem Relation Age of Onset   Diabetes Mother    Heart attack Mother  Diabetes Sister    Diabetes Brother    Cancer Sister        breast   Breast cancer Sister    Seizures Neg Hx     Past Surgical History:  Procedure Laterality Date   ABDOMINAL HYSTERECTOMY     partial   ANTERIOR CERVICAL DECOMP/DISCECTOMY FUSION N/A 02/24/2022   Procedure: CERVICAL FOUR- CERVICAL FIVE ANTERIOR CERVICAL DECOMPRESSION/DISCECTOMY FUSION ONE LEVEL WITH ALLOGRAFT AND PLATE;  Surgeon: [name], MD;  Location: [place];  Service: Orthopedics;  Laterality: N/A;   CATARACT EXTRACTION Left    COLONOSCOPY WITH PROPOFOL N/A 08/21/2015   Procedure: COLONOSCOPY WITH PROPOFOL;  Surgeon: Carol Ada, MD;  Location: [facility];  Service: Endoscopy;  Laterality: N/A;    ESOPHAGOGASTRODUODENOSCOPY (EGD) WITH PROPOFOL N/A 08/21/2015   Procedure: ESOPHAGOGASTRODUODENOSCOPY (EGD) WITH PROPOFOL;  Surgeon: Carol Ada, MD;  [facility]: [facility];  Service: Endoscopy;  Laterality: N/A;   ESOPHAGOGASTRODUODENOSCOPY (EGD) WITH PROPOFOL N/A 10/08/2018   Procedure: ESOPHAGOGASTRODUODENOSCOPY (EGD) WITH PROPOFOL;  Surgeon: Carol Ada, MD;  [facility];  Service: Endoscopy;  Laterality: N/A;   EYE SURGERY Bilateral    cataracts   TUBAL LIGATION     Social History   Occupational History   Occupation: [employer]  Tobacco Use   Smoking status: Never   Smokeless tobacco: Never  Vaping Use   Vaping Use: Never used  Substance and Sexual Activity   Alcohol use: No    Alcohol/week: 0.0 standard drinks   Drug use: No   Sexual activity: Never

## 2022-03-05 DIAGNOSIS — I10 Essential (primary) hypertension: Secondary | ICD-10-CM | POA: Diagnosis not present

## 2022-03-05 DIAGNOSIS — E118 Type 2 diabetes mellitus with unspecified complications: Secondary | ICD-10-CM | POA: Diagnosis not present

## 2022-03-11 NOTE — Discharge Summary (Signed)
Patient ID: Cathy Gonzalez MRN: [id number] DOB/AGE: May 23, 1946 76 y.o.  Admit date: 02/24/2022 Discharge date: 02/25/2022  Admission Diagnoses:  Principal Problem:   Cervical stenosis of spine Active Problems:   Spinal stenosis of cervical region   Discharge Diagnoses:  Principal Problem:   Cervical stenosis of spine Active Problems:   Spinal stenosis of cervical region  status post Procedure(s): CERVICAL FOUR- CERVICAL FIVE ANTERIOR CERVICAL DECOMPRESSION/DISCECTOMY FUSION ONE LEVEL WITH ALLOGRAFT AND PLATE  Past Medical History:  Diagnosis Date   Arthritis    Cataract    left cataract removed in 2017   Diabetes mellitus    Diabetic neuropathy ([place])    Diabetic neuropathy, type II diabetes mellitus ([place])    Hepatitis C    took tx for    Hypertension    Hypokalemia    Knee pain    right    Morbidly obese (HCC)    Nocturnal seizures ([place]) 05/14/2015   no bad seizure since 1980's   OSA on CPAP    uses cpap 2-3 week   Plantar fasciitis    both feet    Surgeries: Procedure(s): CERVICAL FOUR- CERVICAL FIVE ANTERIOR CERVICAL DECOMPRESSION/DISCECTOMY FUSION ONE LEVEL WITH ALLOGRAFT AND PLATE on 04/04/5283   Consultants:   Discharged Condition: Improved  Hospital Course: Cathy Gonzalez is an 76 y.o. female who was admitted 02/24/2022 for operative treatment of Cervical stenosis of spine. Patient failed conservative treatments (please see the history and physical for the specifics) and had severe unremitting pain that affects sleep, daily activities and work/hobbies. After pre-op clearance, the patient was taken to the operating room on 02/24/2022 and underwent  Procedure(s): CERVICAL FOUR- CERVICAL FIVE ANTERIOR CERVICAL DECOMPRESSION/DISCECTOMY FUSION ONE LEVEL WITH ALLOGRAFT AND PLATE.    Patient was given perioperative antibiotics:  Anti-infectives (From admission, onward)    Start     Dose/Rate Route Frequency Ordered Stop   02/24/22 1100  ceFAZolin (ANCEF) IVPB  2g/100 mL premix  Status:  Discontinued        2 g 200 mL/hr over 30 Minutes Intravenous On call to O.R. 02/24/22 1046 02/24/22 1624        Patient was given sequential compression devices and early ambulation to prevent DVT.   Patient benefited maximally from hospital stay and there were no complications. At the time of discharge, the patient was urinating/moving their bowels without difficulty, tolerating a regular diet, pain is controlled with oral pain medications and they have been cleared by PT/OT.   Recent vital signs: No data found.   Recent laboratory studies: No results for input(s): WBC, HGB, HCT, PLT, NA, K, CL, CO2, BUN, CREATININE, GLUCOSE, INR, CALCIUM in the last 72 hours.  Invalid input(s): PT, 2   Discharge Medications:   Allergies as of 02/25/2022       Reactions   Acetaminophen    Other reaction(s): GI upset, constipation if taken regularly        Medication List     STOP taking these medications    acetaminophen 500 MG tablet Commonly known as: TYLENOL       TAKE these medications    amLODipine 10 MG tablet Commonly known as: NORVASC Take 5 mg by mouth daily.   colchicine 0.6 MG tablet Take 0.6 mg by mouth at bedtime.   diclofenac Sodium 1 % Gel Commonly known as: VOLTAREN Apply 2 g topically in the morning and at bedtime.   ezetimibe 10 MG tablet Commonly known as: ZETIA Take 10 mg by  mouth at bedtime.   Fiasp 100 UNIT/ML Soln Generic drug: Insulin Aspart (w/Niacinamide) Inject 3-5 Units as directed daily as needed (150).   furosemide 40 MG tablet Commonly known as: LASIX Take 40 mg by mouth at bedtime.   Jardiance 10 MG Tabs tablet Generic drug: empagliflozin TAKE 1 TABLET BY MOUTH ONCE DAILY BEFORE BREAKFAST   levETIRAcetam 750 MG tablet Commonly known as: KEPPRA Take 2 tablets (1,500 mg total) by mouth 2 (two) times daily.   metFORMIN 500 MG tablet Commonly known as: GLUCOPHAGE Take 500 mg by mouth at bedtime.    methocarbamol 500 MG tablet Commonly known as: ROBAXIN Take 1 tablet (500 mg total) by mouth every 6 (six) hours as needed for muscle spasms.   montelukast 10 MG tablet Commonly known as: SINGULAIR Take 10 mg by mouth at bedtime.   ONE TOUCH ULTRA 2 w/Device Kit 2 (two) times daily. as directed   ONE TOUCH ULTRA TEST test strip Generic drug: glucose blood   OneTouch Delica Lancets 02X Misc   oxyCODONE-acetaminophen 5-325 MG tablet Commonly known as: PERCOCET/ROXICET Take 1 tablet by mouth every 6 (six) hours as needed for severe pain.   phenazopyridine 100 MG tablet Commonly known as: PYRIDIUM Take 100 mg by mouth 3 (three) times daily.   Potassium Chloride ER 20 MEQ Tbcr Take 20 mEq by mouth daily.   pregabalin 300 MG capsule Commonly known as: Lyrica Take 1 capsule (300 mg total) by mouth 2 (two) times daily.   sucralfate 1 g tablet Commonly known as: CARAFATE Take 1 g by mouth 2 (two) times daily.   telmisartan 80 MG tablet Commonly known as: MICARDIS Take 80 mg by mouth daily.        Diagnostic Studies: DG Cervical Spine 2 or 3 views  Result Date: 02/24/2022 CLINICAL DATA:  C4-C5 fusion. EXAM: CERVICAL SPINE - 2 VIEW COMPARISON:  Prior study FINDINGS: Two intraoperative spot views of the cervical spine are submitted postoperatively for interpretation. Anterior plate screw fixation and interbody fusion material at C4-C5 noted. No gross complicating features noted. IMPRESSION: Anterior fusion at C4-C5 without gross complicating features. Electronically Signed   By: Margarette Canada M.D.   On: 02/24/2022 14:47   DG Foot Complete Left  Result Date: 02/17/2022 Please see detailed radiograph report in office note.  DG C-Arm 1-60 Min-No Report  Result Date: 02/24/2022 Fluoroscopy was utilized by the requesting physician.  No radiographic interpretation.   DG C-Arm 1-60 Min-No Report  Result Date: 02/24/2022 Fluoroscopy was utilized by the requesting physician.  No  radiographic interpretation.   XR Cervical Spine 2 or 3 views  Result Date: 03/04/2022 2 view x-rays cervical spine demonstrates C4-5 postop cervical fusion.  Good position of the plate screws.  Prevertebral swelling noted 10 days postop as expected. Impression: Satisfactory single level C4-5 ACDF.   Discharge Instructions     Incentive spirometry RT   Complete by: As directed         Follow-up Information     [name], MD Follow up in 1 week(s).   Specialty: Orthopedic Surgery Contact information: [address] [place] [address] [phone]                 Discharge Plan:  discharge to home  Disposition:     Signed: Benjiman Core  03/11/2022, 9:23 AM

## 2022-03-19 ENCOUNTER — Telehealth: Payer: Self-pay | Admitting: Orthopaedic Surgery

## 2022-03-19 ENCOUNTER — Other Ambulatory Visit: Payer: Self-pay | Admitting: Orthopaedic Surgery

## 2022-03-19 DIAGNOSIS — G4733 Obstructive sleep apnea (adult) (pediatric): Secondary | ICD-10-CM | POA: Diagnosis not present

## 2022-03-19 MED ORDER — OXYCODONE-ACETAMINOPHEN 5-325 MG PO TABS: 1.0000 | ORAL_TABLET | Freq: Four times a day (QID) | ORAL | 0 refills | Status: DC | PRN

## 2022-03-19 NOTE — Telephone Encounter (Signed)
Pt called requesting pain medication. Please send t pharmacy on file. Please call pt about this matter at [phone].

## 2022-03-20 NOTE — Telephone Encounter (Signed)
Patient aware this was sent in for her  

## 2022-03-24 ENCOUNTER — Ambulatory Visit: Payer: HMO | Admitting: Physical Medicine and Rehabilitation

## 2022-03-27 ENCOUNTER — Other Ambulatory Visit: Payer: Self-pay | Admitting: Family Medicine

## 2022-03-27 DIAGNOSIS — Z1231 Encounter for screening mammogram for malignant neoplasm of breast: Secondary | ICD-10-CM

## 2022-04-01 ENCOUNTER — Ambulatory Visit: Payer: HMO | Admitting: Physical Medicine and Rehabilitation

## 2022-04-04 DIAGNOSIS — E118 Type 2 diabetes mellitus with unspecified complications: Secondary | ICD-10-CM | POA: Diagnosis not present

## 2022-04-04 DIAGNOSIS — I10 Essential (primary) hypertension: Secondary | ICD-10-CM | POA: Diagnosis not present

## 2022-04-09 ENCOUNTER — Encounter: Payer: Self-pay | Admitting: Family Medicine

## 2022-04-10 ENCOUNTER — Telehealth: Payer: Self-pay | Admitting: Family Medicine

## 2022-04-10 NOTE — Telephone Encounter (Signed)
There is no one dedicated to monitor every single CPAP machine in the world. The CPAP records the data and we are able to get access to the data when we complete the DL.  Usually if the pt has a problem or feels like not sleeping well then they notify us and we check the data to see if changes are needed to be made. Otherwise the data is assessed at the follow up appointments and as long as apnea is being well treated than there is no need for changes. In order to get insurance to cover the supplies, the patient must remain compliant meaning using it at least little over 4 hours each night. Missing here or there is ok.    **If pt returns call please advise that it is impossible for a person to be designated to review her CPAP data every night. We will continue to have our follow up visits and complete downloads at that time and make changes accordingly. If she is having problems with the machine in the meantime, she can bring it by for [place] to DL and we can look to see if changes need to be made.

## 2022-04-10 NOTE — Telephone Encounter (Signed)
Pt states she was told by Dr Jannifer Franklin that her readings from CPAP were being monitored daily.  Message from RN was relayed:we don't look at her data until she comes to visit.  Pt states this is not right and is concerned that something could occur in her sleep to affect her and no one would know. Pt was advised she would need to discuss further with her DME company on why its not recording .  Pt states she will go to the DME on Tues of next week and if she doesn't like what she is told by them she will call the office back.  This is FYI no call back requested at this time

## 2022-04-15 ENCOUNTER — Ambulatory Visit: Payer: Self-pay

## 2022-04-15 ENCOUNTER — Ambulatory Visit (INDEPENDENT_AMBULATORY_CARE_PROVIDER_SITE_OTHER): Payer: HMO | Admitting: Orthopaedic Surgery

## 2022-04-15 ENCOUNTER — Ambulatory Visit: Payer: HMO | Admitting: Orthopaedic Surgery

## 2022-04-15 ENCOUNTER — Encounter: Payer: Self-pay | Admitting: Orthopaedic Surgery

## 2022-04-15 VITALS — BP 128/80 | HR 96 | Ht 64.0 in | Wt 220.0 lb

## 2022-04-15 DIAGNOSIS — Z981 Arthrodesis status: Secondary | ICD-10-CM

## 2022-04-15 NOTE — Progress Notes (Signed)
Post-Op Visit Note   Patient: Cathy Gonzalez           Date of Birth: [birth year]           MRN: [id number] Visit Date: 04/15/2022 PCP: [name], MD   Assessment & Plan: Post C4-5 fusion.  Flexion-extension shows no motion.  She is happy with the results of surgery with good relief.  She does have some lumbar stenosis and would like to come back in a few months to get her progressive lumbar stenosis evaluated.  She continues to lose weight and is happy the results of her cervical fusion.  Chief Complaint:  Chief Complaint  Patient presents with   Neck - Routine Post Op   Visit Diagnoses:  1. Status post cervical spinal fusion     Plan: Work slip given for work resumption on the 18th without restrictions.  Return as needed.  Follow-Up Instructions: No follow-ups on file.   Orders:  Orders Placed This Encounter  Procedures   XR Cervical Spine 2 or 3 views   No orders of the defined types were placed in this encounter.   Imaging: No results found.  PMFS History: Patient Active Problem List   Diagnosis Date Noted   Cervical stenosis of spine 02/24/2022   Spinal stenosis of cervical region 01/03/2022   Neck pain 12/03/2021   Abnormal liver function tests 02/12/2021   Diarrhea 02/12/2021   Gastro-esophageal reflux disease without esophagitis 64/40/3474   Periumbilical pain 25/95/6387   Personal history of colonic polyps 02/12/2021   Portal hypertension ([place]) 02/12/2021   Screening for malignant neoplasm of colon 02/12/2021   Type 2 diabetes mellitus without complications ([place]) 56/43/3295   Primary osteoarthritis, left hand 11/07/2020   Arthritis of carpometacarpal (CMC) joint of thumb 07/18/2020   Bilateral primary osteoarthritis of knee 06/20/2020   Bilateral primary osteoarthritis of hip 06/07/2020   Right thigh pain 05/09/2020   Low back pain 07/14/2019   OSA on CPAP 05/16/2019   Diabetic neuropathy associated with type 2 diabetes mellitus ([place]) 12/13/2018    Chronic pain of right knee 12/09/2018   Thumb pain, left 12/09/2018   Dizziness and giddiness 01/14/2016   Hepatic cirrhosis ([place]) 07/26/2015   Nocturnal seizures ([place]) 05/14/2015   Chronic hepatitis C without hepatic coma ([place]) 03/14/2015   Vertigo, central 02/11/2014   Difficulty walking 02/11/2014   Diabetes mellitus ([place]) 02/11/2014   Dysphagia 02/11/2014   OBESITY 07/26/2010   Essential hypertension, benign 07/26/2010   DEGENERATIVE JOINT DISEASE, GENERALIZED 07/26/2010   Past Medical History:  Diagnosis Date   Arthritis    Cataract    left cataract removed in 2017   Diabetes mellitus    Diabetic neuropathy (HCC)    Diabetic neuropathy, type II diabetes mellitus ([place])    Hepatitis C    took tx for    Hypertension    Hypokalemia    Knee pain    right    Morbidly obese (HCC)    Nocturnal seizures ([place]) 05/14/2015   no bad seizure since 1980's   OSA on CPAP    uses cpap 2-3 week   Plantar fasciitis    both feet    Family History  Problem Relation Age of Onset   Diabetes Mother    Heart attack Mother    Diabetes Sister    Diabetes Brother    Cancer Sister        breast   Breast cancer Sister    Seizures Neg Hx  Past Surgical History:  Procedure Laterality Date   ABDOMINAL HYSTERECTOMY     partial   ANTERIOR CERVICAL DECOMP/DISCECTOMY FUSION N/A 02/24/2022   Procedure: CERVICAL FOUR- CERVICAL FIVE ANTERIOR CERVICAL DECOMPRESSION/DISCECTOMY FUSION ONE LEVEL WITH ALLOGRAFT AND PLATE;  Surgeon: [name], MD;  Location: [facility];  Service: Orthopedics;  Laterality: N/A;   CATARACT EXTRACTION Left    COLONOSCOPY WITH PROPOFOL N/A 08/21/2015   Procedure: COLONOSCOPY WITH PROPOFOL;  Surgeon: Carol Ada, MD;  [facility];  Service: Endoscopy;  Laterality: N/A;   ESOPHAGOGASTRODUODENOSCOPY (EGD) WITH PROPOFOL N/A 08/21/2015   Procedure: ESOPHAGOGASTRODUODENOSCOPY (EGD) WITH PROPOFOL;  Surgeon: Carol Ada, MD;  [facility];  Service:  Endoscopy;  Laterality: N/A;   ESOPHAGOGASTRODUODENOSCOPY (EGD) WITH PROPOFOL N/A 10/08/2018   Procedure: ESOPHAGOGASTRODUODENOSCOPY (EGD) WITH PROPOFOL;  Surgeon: Carol Ada, MD;  [facility];  Service: Endoscopy;  Laterality: N/A;   EYE SURGERY Bilateral    cataracts   TUBAL LIGATION     Social History   Occupational History   Occupation: [employer]  Tobacco Use   Smoking status: Never   Smokeless tobacco: Never  Vaping Use   Vaping Use: Never used  Substance and Sexual Activity   Alcohol use: No    Alcohol/week: 0.0 standard drinks of alcohol   Drug use: No   Sexual activity: Never

## 2022-05-01 ENCOUNTER — Ambulatory Visit: Payer: PPO | Admitting: Neurology

## 2022-05-01 ENCOUNTER — Encounter: Payer: Self-pay | Admitting: Neurology

## 2022-05-01 VITALS — BP 133/81 | HR 83 | Ht 64.0 in | Wt 232.6 lb

## 2022-05-01 DIAGNOSIS — R569 Unspecified convulsions: Secondary | ICD-10-CM | POA: Diagnosis not present

## 2022-05-01 DIAGNOSIS — G4733 Obstructive sleep apnea (adult) (pediatric): Secondary | ICD-10-CM

## 2022-05-01 DIAGNOSIS — Z9989 Dependence on other enabling machines and devices: Secondary | ICD-10-CM

## 2022-05-01 MED ORDER — LEVETIRACETAM 750 MG PO TABS: 1500.0000 mg | ORAL_TABLET | Freq: Two times a day (BID) | ORAL | 4 refills | Status: DC

## 2022-05-01 NOTE — Patient Instructions (Signed)
Continue Keppra Get back to using the CPAP, I will pull a download in 5 weeks to make sure you are using

## 2022-05-01 NOTE — Progress Notes (Signed)
PATIENT: Cathy Gonzalez DOB: 1946/06/02  REASON FOR VISIT: follow up HISTORY FROM: patient PRIMARY NEUROLOGIST: Dr. Brett Fairy  HISTORY OF PRESENT ILLNESS: Today 05/01/22  Cathy Gonzalez is here today for follow-up.  Is on Keppra for seizures. On 02/24/22 had cervical fusion with Dr. Lorin Mercy, it went well.  Is on CPAP.  She could not use CPAP due to wearing a soft neck brace for period of time. Denies any recent seizure. On Monday, she took her CPAP in for service. Is ready to get back to using.  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  No falls.  Update 01/07/2021 SS: Cathy Gonzalez is a 76 year old female with history of obesity, diabetes, diabetic peripheral neuropathy, and nocturnal seizures.  She is on CPAP. She is on Keppra for seizures, also Lyrica for neuropathy filled by PCP.  She is doing overall well, may have had a small seizure 1-2 years ago, but isn't sure on timeframe.  Seizures are always nocturnal, will wake up the next morning bit her tongue, feel drowsy the next day.  Are often brought on by stress.  She lives alone, drives a car.  Has been prescribed lorazepam to take as needed for panic attacks.  She has a lot of stress with her grown children.  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  Claims mostly compliant with CPAP, only misses if she doesn't have masks.  Was dealing with a poorly healing wound to her left lower leg, is now resolved.  Last A1c was around 6.5.  Here today for evaluation unaccompanied.  Update 01/02/2020 SS: Cathy Gonzalez is a 76 year old female with history of obesity, diabetes, diabetic peripheral neuropathy, and nocturnal seizures.  She is on CPAP.  She has had 1 brief nocturnal seizure in December 2019, associated with significant stress.  She remains on Keppra and Lyrica.  She has not had recurrent seizure.  Her neuropathy is doing better.  Her diabetes is under good control, recent A1c was 5.6.  She has been able to lose 30 pounds.  She says her  gait and balance is more stable.  She has not had any falls.  She continues to use CPAP, but is having issues with the tubing leaking, despite getting new tubing.  She presents today for evaluation accompanied by her great-grandson.  She denies any new problems or concerns.  HISTORY 12/13/2018 Dr. Jannifer Franklin: Cathy Gonzalez is a 76 year old right-handed black female with a history of obesity, diabetes, diabetic peripheral neuropathy, and nocturnal seizures.  The patient also indicates that she has sleep apnea, she has a CPAP machine but she does not tolerate the mask.  The patient indicates that when she tries to use a humidifier with the mask she has problems with the water getting in her throat, when she does not use a humidifier, her mouth dries out.  She wishes to have reevaluation of this issue.  The patient has had 1 brief nocturnal seizure in December 2019 associated with the time of stress when her sister and her sister's son died.  The patient has otherwise gone well over a year without any seizures, she is on Keppra taking 1500 mg twice daily.  The patient tolerates the drug fairly well.  She is also on Lyrica taking 300 mg twice daily.  She does have some mild gait instability, she has not had any falls.   REVIEW OF SYSTEMS: Out of a complete 14 system review of symptoms, the  patient complains only of the following symptoms, and all other reviewed systems are negative.  See HPI  ALLERGIES: Allergies  Allergen Reactions   Acetaminophen     Other reaction(s): GI upset, constipation if taken regularly    HOME MEDICATIONS: Outpatient Medications Prior to Visit  Medication Sig Dispense Refill   amLODipine (NORVASC) 10 MG tablet Take 5 mg by mouth daily.     Blood Glucose Monitoring Suppl (ONE TOUCH ULTRA 2) w/Device KIT 2 (two) times daily. as directed     colchicine 0.6 MG tablet Take 0.6 mg by mouth at bedtime.     diclofenac Sodium (VOLTAREN) 1 % GEL Apply 2 g topically in the morning and at  bedtime.     empagliflozin (JARDIANCE) 10 MG TABS tablet TAKE 1 TABLET BY MOUTH ONCE DAILY BEFORE BREAKFAST 90 tablet 3   ezetimibe (ZETIA) 10 MG tablet Take 10 mg by mouth at bedtime.      furosemide (LASIX) 40 MG tablet Take 40 mg by mouth at bedtime.     Insulin Aspart, w/Niacinamide, (FIASP) 100 UNIT/ML SOLN Inject 3-5 Units as directed daily as needed (150).     levETIRAcetam (KEPPRA) 750 MG tablet Take 2 tablets (1,500 mg total) by mouth 2 (two) times daily. 360 tablet 4   metFORMIN (GLUCOPHAGE) 500 MG tablet Take 500 mg by mouth at bedtime.      methocarbamol (ROBAXIN) 500 MG tablet Take 1 tablet (500 mg total) by mouth every 6 (six) hours as needed for muscle spasms. 60 tablet 0   montelukast (SINGULAIR) 10 MG tablet Take 10 mg by mouth at bedtime.     ONE TOUCH ULTRA TEST test strip   5   ONETOUCH DELICA LANCETS 34V MISC   3   oxyCODONE-acetaminophen (PERCOCET/ROXICET) 5-325 MG tablet Take 1 tablet by mouth every 6 (six) hours as needed for severe pain. 50 tablet 0   phenazopyridine (PYRIDIUM) 100 MG tablet Take 100 mg by mouth 3 (three) times daily.     Potassium Chloride ER 20 MEQ TBCR Take 20 mEq by mouth daily.  6   pregabalin (LYRICA) 300 MG capsule Take 1 capsule (300 mg total) by mouth 2 (two) times daily. 60 capsule 0   sucralfate (CARAFATE) 1 G tablet Take 1 g by mouth 2 (two) times daily.      telmisartan (MICARDIS) 80 MG tablet Take 80 mg by mouth daily.   1   oxyCODONE-acetaminophen (PERCOCET/ROXICET) 5-325 MG tablet Take 1 tablet by mouth every 6 (six) hours as needed for severe pain. 30 tablet 0   No facility-administered medications prior to visit.    PAST MEDICAL HISTORY: Past Medical History:  Diagnosis Date   Arthritis    Cataract    left cataract removed in 2017   Diabetes mellitus    Diabetic neuropathy ([place])    Diabetic neuropathy, type II diabetes mellitus ([place])    Hepatitis C    took tx for    Hypertension    Hypokalemia    Knee pain    right     Morbidly obese ([place])    Nocturnal seizures ([place]) 05/14/2015   no bad seizure since 1980's   OSA on CPAP    uses cpap 2-3 week   Plantar fasciitis    both feet    PAST SURGICAL HISTORY: Past Surgical History:  Procedure Laterality Date   ABDOMINAL HYSTERECTOMY     partial   ANTERIOR CERVICAL DECOMP/DISCECTOMY FUSION N/A 02/24/2022   Procedure: CERVICAL FOUR- CERVICAL  FIVE ANTERIOR CERVICAL DECOMPRESSION/DISCECTOMY FUSION ONE LEVEL WITH ALLOGRAFT AND PLATE;  Surgeon: [name], MD;  Location: [facility];  Service: Orthopedics;  Laterality: N/A;   CATARACT EXTRACTION Left    COLONOSCOPY WITH PROPOFOL N/A 08/21/2015   Procedure: COLONOSCOPY WITH PROPOFOL;  Surgeon: Carol Ada, MD;  [facility];  Service: Endoscopy;  Laterality: N/A;   ESOPHAGOGASTRODUODENOSCOPY (EGD) WITH PROPOFOL N/A 08/21/2015   Procedure: ESOPHAGOGASTRODUODENOSCOPY (EGD) WITH PROPOFOL;  Surgeon: Carol Ada, MD;  [facility];  Service: Endoscopy;  Laterality: N/A;   ESOPHAGOGASTRODUODENOSCOPY (EGD) WITH PROPOFOL N/A 10/08/2018   Procedure: ESOPHAGOGASTRODUODENOSCOPY (EGD) WITH PROPOFOL;  Surgeon: Carol Ada, MD;  [facility];  Service: Endoscopy;  Laterality: N/A;   EYE SURGERY Bilateral    cataracts   TUBAL LIGATION      FAMILY HISTORY: Family History  Problem Relation Age of Onset   Diabetes Mother    Heart attack Mother    Diabetes Sister    Diabetes Brother    Cancer Sister        breast   Breast cancer Sister    Seizures Neg Hx     SOCIAL HISTORY: Social History   Socioeconomic History   Marital status: Single    Spouse name: Not on file   Number of children: 4   Years of education: 12   Highest education level: Not on file  Occupational History   Occupation: [employer]  Tobacco Use   Smoking status: Never   Smokeless tobacco: Never  Vaping Use   Vaping Use: Never used  Substance and Sexual Activity   Alcohol use: No    Alcohol/week: 0.0 standard drinks  of alcohol   Drug use: No   Sexual activity: Never  Other Topics Concern   Not on file  Social History Narrative   Patient occasionally drinks caffeine.   Patient is right handed.   Social Determinants of Health   Financial Resource Strain: Not on file  Food Insecurity: Food Insecurity Present (04/10/2020)   Hunger Vital Sign    Worried About Running Out of Food in the Last Year: Sometimes true    Ran Out of Food in the Last Year: Never true  Transportation Needs: No Transportation Needs (04/10/2020)   PRAPARE - Hydrologist (Medical): No    Lack of Transportation (Non-Medical): No  Physical Activity: Not on file  Stress: Not on file  Social Connections: Not on file  Intimate Partner Violence: Not on file   PHYSICAL EXAM  Vitals:   05/01/22 1307  BP: 133/81  Pulse: 83  Weight: 232 lb 9.6 oz (105.5 kg)  Height: '5\' 4"'  (1.626 m)    Body mass index is 39.93 kg/m.  Generalized: Well developed, in no acute distress   Neurological examination  Mentation: Alert oriented to time, place, history taking. Follows all commands speech and language fluent Cranial nerve II-XII: Pupils were equal round reactive to light. Extraocular movements were full, visual field were full on confrontational test. Facial sensation and strength were normal. Head turning and shoulder shrug were normal and symmetric. Motor: The motor testing reveals 5 over 5 strength of all 4 extremities. Good symmetric motor tone is noted throughout.  Sensory: Sensory testing is intact to soft touch on all 4 extremities. No evidence of extinction is noted.  Coordination: Cerebellar testing reveals good finger-nose-finger and heel-to-shin bilaterally.  Gait and station: Gait is steady, slightly wide-based, but independent Reflexes: Deep tendon reflexes are symmetric but decreased.  DIAGNOSTIC DATA (LABS, IMAGING, TESTING) - I reviewed patient records, labs, notes, testing and imaging myself  where available.  Lab Results  Component Value Date   WBC 6.6 02/17/2022   HGB 13.9 02/17/2022   HCT 41.3 02/17/2022   MCV 89.6 02/17/2022   PLT 299 02/17/2022      Component Value Date/Time   NA 138 02/17/2022 0900   NA 140 08/11/2016 0833   K 3.3 (L) 02/17/2022 0900   CL 103 02/17/2022 0900   CO2 25 02/17/2022 0900   GLUCOSE 163 (H) 02/17/2022 0900   BUN 19 02/17/2022 0900   BUN 23 08/11/2016 0833   CREATININE 1.29 (H) 02/17/2022 0900   CREATININE 1.24 (H) 06/20/2021 1349   CALCIUM 9.2 02/17/2022 0900   PROT 6.9 02/17/2022 0900   PROT 7.2 08/11/2016 0833   ALBUMIN 3.9 02/17/2022 0900   ALBUMIN 4.5 08/11/2016 0833   AST 41 02/17/2022 0900   ALT 54 (H) 02/17/2022 0900   ALKPHOS 89 02/17/2022 0900   BILITOT 0.6 02/17/2022 0900   BILITOT <0.2 08/11/2016 0833   GFRNONAA 44 (L) 02/17/2022 0900   GFRNONAA 53 (L) 10/20/2016 1549   GFRAA 52 (L) 02/16/2017 1325   GFRAA 61 10/20/2016 1549   Lab Results  Component Value Date   CHOL 216 (H) 02/11/2014   HDL 65 02/11/2014   LDLCALC 133 (H) 02/11/2014   TRIG 92 02/11/2014   CHOLHDL 3.3 02/11/2014   Lab Results  Component Value Date   HGBA1C 6.1 (H) 02/17/2022   No results found for: "VITAMINB12" Lab Results  Component Value Date   TSH 1.680 09/10/2020   ASSESSMENT AND PLAN 76 y.o. year old female  has a past medical history of Arthritis, Cataract, Diabetes mellitus, Diabetic neuropathy ([place]), Diabetic neuropathy, type II diabetes mellitus ([place]), Hepatitis C, Hypertension, Hypokalemia, Knee pain, Morbidly obese ([place]), Nocturnal seizures ([place]) (05/14/2015), OSA on CPAP, and Plantar fasciitis. here with:  1.  Nocturnal seizures 2.  Sleep apnea, on CPAP 3.  Diabetic peripheral neuropathy 4.  Recent cervical spine fusion  -No recent seizures, continue Keppra -Needs to get back on CPAP use post cervical spine surgery, I will pull a download in about 4 weeks to check compliance and data -Follow-up in 1 year or sooner if  needed  Meds ordered this encounter  Medications   levETIRAcetam (KEPPRA) 750 MG tablet    Sig: Take 2 tablets (1,500 mg total) by mouth 2 (two) times daily.    Dispense:  360 tablet    Refill:  4   Butler Denmark, AGNP-C, DNP 05/01/2022, 1:10 PM [facility] [address], [place], [place] [address] [phone]

## 2022-05-05 ENCOUNTER — Other Ambulatory Visit: Payer: Self-pay | Admitting: Orthopaedic Surgery

## 2022-05-05 ENCOUNTER — Ambulatory Visit
Admission: RE | Admit: 2022-05-05 | Discharge: 2022-05-05 | Disposition: A | Discharge status: Home / Self Care | Payer: PPO | Source: Ambulatory Visit | Attending: Family Medicine | Admitting: Family Medicine

## 2022-05-05 ENCOUNTER — Telehealth: Payer: Self-pay | Admitting: Orthopaedic Surgery

## 2022-05-05 DIAGNOSIS — Z1231 Encounter for screening mammogram for malignant neoplasm of breast: Secondary | ICD-10-CM

## 2022-05-05 MED ORDER — METHOCARBAMOL 500 MG PO TABS
500.0000 mg | ORAL_TABLET | Freq: Four times a day (QID) | ORAL | 0 refills | Status: DC | PRN
Start: 2022-05-05 — End: 2023-03-16

## 2022-05-05 NOTE — Telephone Encounter (Signed)
Pt called states she is still experiencing pain in shoulder from surgery. She is asking for oxycodone and muscle relaxer. Please send to pharmacy on file. Pt phone number is [phone].

## 2022-05-05 NOTE — Telephone Encounter (Signed)
Please advise 

## 2022-05-06 ENCOUNTER — Ambulatory Visit (INDEPENDENT_AMBULATORY_CARE_PROVIDER_SITE_OTHER): Payer: PPO | Admitting: Podiatry

## 2022-05-06 ENCOUNTER — Encounter: Payer: Self-pay | Admitting: Podiatry

## 2022-05-06 DIAGNOSIS — M79674 Pain in right toe(s): Secondary | ICD-10-CM | POA: Diagnosis not present

## 2022-05-06 DIAGNOSIS — E1140 Type 2 diabetes mellitus with diabetic neuropathy, unspecified: Secondary | ICD-10-CM

## 2022-05-06 DIAGNOSIS — E1149 Type 2 diabetes mellitus with other diabetic neurological complication: Secondary | ICD-10-CM | POA: Diagnosis not present

## 2022-05-06 DIAGNOSIS — M79675 Pain in left toe(s): Secondary | ICD-10-CM | POA: Diagnosis not present

## 2022-05-06 DIAGNOSIS — B351 Tinea unguium: Secondary | ICD-10-CM | POA: Diagnosis not present

## 2022-05-06 NOTE — Progress Notes (Signed)
This patient returns to my office for at risk foot care.  This patient requires this care by a professional since this patient will be at risk due to having type 2 diabetes.  This patient is unable to cut nails herself since the patient cannot reach her nails.These nails are painful walking and wearing shoes.  This patient presents for at risk foot care today.  General Appearance  Alert, conversant and in no acute stress.  Vascular  Dorsalis pedis and posterior tibial  pulses are palpable  bilaterally.  Capillary return is within normal limits  bilaterally. Temperature is within normal limits  bilaterally.  Neurologic  Senn-Weinstein monofilament wire test within normal limits  bilaterally. Muscle power within normal limits bilaterally.  Nails Thick disfigured discolored nails with subungual debris  from second  to fifth toes bilaterally. No evidence of bacterial infection or drainage bilaterally.  Orthopedic  No limitations of motion  feet .  No crepitus or effusions noted.  No bony pathology or digital deformities noted.  HAV  B/L.  Skin  normotropic skin with no porokeratosis noted bilaterally.  No signs of infections or ulcers noted.     Onychomycosis  Pain in right toes  Pain in left toes  Consent was obtained for treatment procedures.   Mechanical debridement of nails 2-5  bilaterally performed with a nail nipper.  Filed with dremel without incident.    Return office visit    3 months                  Told patient to return for periodic foot care and evaluation due to potential at risk complications.   Gardiner Barefoot DPM

## 2022-05-19 ENCOUNTER — Telehealth: Payer: Self-pay | Admitting: Neurology

## 2022-05-19 NOTE — Telephone Encounter (Signed)
Pt asked that the message be forwarded to Judson Roch, NP that she doesn't know what the guy did to her CPAP but when she uses it now it makes her sick.  Pt is going to take the CPAP back to [facility] to try and get it fixed. She will call back once fixed.  This is FYI no call back requested

## 2022-05-19 NOTE — Telephone Encounter (Signed)
Noted thank you

## 2022-05-20 ENCOUNTER — Encounter: Payer: Self-pay | Admitting: Orthopaedic Surgery

## 2022-05-20 ENCOUNTER — Ambulatory Visit (INDEPENDENT_AMBULATORY_CARE_PROVIDER_SITE_OTHER): Payer: HMO

## 2022-05-20 ENCOUNTER — Ambulatory Visit (INDEPENDENT_AMBULATORY_CARE_PROVIDER_SITE_OTHER): Payer: HMO | Admitting: Orthopaedic Surgery

## 2022-05-20 VITALS — BP 125/79 | HR 80 | Ht 64.0 in | Wt 234.0 lb

## 2022-05-20 DIAGNOSIS — G8929 Other chronic pain: Secondary | ICD-10-CM

## 2022-05-20 DIAGNOSIS — M1711 Unilateral primary osteoarthritis, right knee: Secondary | ICD-10-CM

## 2022-05-20 DIAGNOSIS — M545 Low back pain, unspecified: Secondary | ICD-10-CM

## 2022-05-20 DIAGNOSIS — M25561 Pain in right knee: Secondary | ICD-10-CM

## 2022-05-20 MED ORDER — METHYLPREDNISOLONE ACETATE 40 MG/ML IJ SUSP
40.0000 mg | INTRAMUSCULAR | Status: AC | PRN
  Administered 2022-05-20: 40 mg via INTRA_ARTICULAR

## 2022-05-20 MED ORDER — BUPIVACAINE HCL 0.25 % IJ SOLN
4.0000 mL | INTRAMUSCULAR | Status: AC | PRN
  Administered 2022-05-20: 4 mL via INTRA_ARTICULAR

## 2022-05-20 MED ORDER — LIDOCAINE HCL 1 % IJ SOLN
0.5000 mL | INTRAMUSCULAR | Status: AC | PRN
  Administered 2022-05-20: .5 mL

## 2022-05-20 NOTE — Progress Notes (Signed)
Office Visit Note   Patient: Cathy Gonzalez           Date of Birth: November 03, 1945           MRN: [id number] Visit Date: 05/20/2022              Requested by: [name], [address] [place],  [place] [address] PCP: [name], MD   Assessment & Plan: Visit Diagnoses:  1. Chronic pain of right knee   2. Chronic bilateral low back pain, unspecified whether sciatica present     Plan: Right knee injection performed.  She is noted improvement in her gait and balance since the surgery on her neck.  Hopefully she will continue to get some more improvement.  She still has slight myelopathic gait pattern.  X-ray results of her knee were reviewed in detail.  She does have significant multilevel disc degeneration but is not having neurogenic claudication symptoms currently.  Follow-Up Instructions: No follow-ups on file.   Orders:  Orders Placed This Encounter  Procedures   XR Lumbar Spine 2-3 Views   XR KNEE 3 VIEW RIGHT   No orders of the defined types were placed in this encounter.     Procedures: Large Joint Inj: R knee on 05/20/2022 1:36 PM Indications: pain and joint swelling Details: 22 G 1.5 in needle, anterolateral approach  Arthrogram: No  Medications: 40 mg methylPREDNISolone acetate 40 MG/ML; 0.5 mL lidocaine 1 %; 4 mL bupivacaine 0.25 % Outcome: tolerated well, no immediate complications Procedure, treatment alternatives, risks and benefits explained, specific risks discussed. Consent was given by the patient. Immediately prior to procedure a time out was called to verify the correct patient, procedure, equipment, support staff and site/side marked as required. Patient was prepped and draped in the usual sterile fashion.       Clinical Data: No additional findings.   Subjective: Chief Complaint  Patient presents with   Right Knee - Pain   Lower Back - Pain    HPI 76 year old female returns with ongoing problems with right greater than left knee pain  and also ongoing low back pain.  Patient had altered gait with myelopathic changes at C4-5 level and single level cervical fusion 02/24/2022.  She states after the surgery her balance is better her gait is improved but she still has problems with pain in her knees worse on the right than left with some crepitus and x-rays that showed some joint space narrowing.  She continues to have some pain in her back and lumbar radiographs today showed multilevel disc space narrowing and spurring involving all levels of the lumbar spine.  Review of Systems all the systems update unchanged from February surgery review of systems.   Objective: Vital Signs: BP 125/79   Pulse 80   Ht '5\' 4"'$  (1.626 m)   Wt 234 lb (106.1 kg)   BMI 40.17 kg/m   Physical Exam Constitutional:      Appearance: She is well-developed.  HENT:     Head: Normocephalic.     Right Ear: External ear normal.     Left Ear: External ear normal. There is no impacted cerumen.  Eyes:     Pupils: Pupils are equal, round, and reactive to light.  Neck:     Thyroid: No thyromegaly.     Trachea: No tracheal deviation.  Cardiovascular:     Rate and Rhythm: Normal rate.  Pulmonary:     Effort: Pulmonary effort is normal.  Abdominal:     Palpations: Abdomen is soft.  Musculoskeletal:     Cervical back: No rigidity.  Skin:    General: Skin is warm and dry.  Neurological:     Mental Status: She is alert and oriented to person, place, and time.  Psychiatric:        Behavior: Behavior normal.     Ortho Exam crepitus with right knee more than left knee range of motion.  She does reach full extension medial joint line tenderness more than lateral.  Negative logroll hips right and left patient slow getting present and standing.  Balance is rated at failure.  No lower extremity clonus.  Ankle dorsiflexion plantarflexion strength is good.  Specialty Comments:  No specialty comments available.  Imaging: No results found.   PMFS  History: Patient Active Problem List   Diagnosis Date Noted   Pain due to onychomycosis of toenails of both feet 05/06/2022   Cervical stenosis of spine 02/24/2022   Spinal stenosis of cervical region 01/03/2022   Neck pain 12/03/2021   Abnormal liver function tests 02/12/2021   Diarrhea 02/12/2021   Gastro-esophageal reflux disease without esophagitis 70/35/0093   Periumbilical pain 81/82/9937   Personal history of colonic polyps 02/12/2021   Portal hypertension ([place]) 02/12/2021   Screening for malignant neoplasm of colon 02/12/2021   Type 2 diabetes mellitus without complications ([place]) 16/96/7893   Primary osteoarthritis, left hand 11/07/2020   Arthritis of carpometacarpal ([facility]) joint of thumb 07/18/2020   Bilateral primary osteoarthritis of knee 06/20/2020   Bilateral primary osteoarthritis of hip 06/07/2020   Right thigh pain 05/09/2020   Low back pain 07/14/2019   OSA on CPAP 05/16/2019   Diabetic neuropathy associated with type 2 diabetes mellitus ([place]) 12/13/2018   Chronic pain of right knee 12/09/2018   Thumb pain, left 12/09/2018   Dizziness and giddiness 01/14/2016   Hepatic cirrhosis ([place]) 07/26/2015   Nocturnal seizures ([place]) 05/14/2015   Chronic hepatitis C without hepatic coma ([place]) 03/14/2015   Vertigo, central 02/11/2014   Difficulty walking 02/11/2014   Diabetes mellitus ([place]) 02/11/2014   Dysphagia 02/11/2014   OBESITY 07/26/2010   Essential hypertension, benign 07/26/2010   DEGENERATIVE JOINT DISEASE, GENERALIZED 07/26/2010   Past Medical History:  Diagnosis Date   Arthritis    Cataract    left cataract removed in 2017   Diabetes mellitus    Diabetic neuropathy ([facility])    Diabetic neuropathy, type II diabetes mellitus ([place])    Hepatitis C    took tx for    Hypertension    Hypokalemia    Knee pain    right    Morbidly obese ([facility])    Nocturnal seizures ([place]) 05/14/2015   no bad seizure since 1980's   OSA on CPAP    uses cpap 2-3 week   Plantar  fasciitis    both feet    Family History  Problem Relation Age of Onset   Diabetes Mother    Heart attack Mother    Diabetes Sister    Diabetes Brother    Cancer Sister        breast   Breast cancer Sister    Seizures Neg Hx     Past Surgical History:  Procedure Laterality Date   ABDOMINAL HYSTERECTOMY     partial   ANTERIOR CERVICAL DECOMP/DISCECTOMY FUSION N/A 02/24/2022   Procedure: CERVICAL FOUR- CERVICAL FIVE ANTERIOR CERVICAL DECOMPRESSION/DISCECTOMY FUSION ONE LEVEL WITH ALLOGRAFT AND PLATE;  Surgeon: [name], MD;  Location:  [facility];  Service: Orthopedics;  Laterality: N/A;   CATARACT EXTRACTION Left    COLONOSCOPY WITH PROPOFOL N/A 08/21/2015   Procedure: COLONOSCOPY WITH PROPOFOL;  Surgeon: Carol Ada, MD;  [facility];  Service: Endoscopy;  Laterality: N/A;   ESOPHAGOGASTRODUODENOSCOPY (EGD) WITH PROPOFOL N/A 08/21/2015   Procedure: ESOPHAGOGASTRODUODENOSCOPY (EGD) WITH PROPOFOL;  Surgeon: Carol Ada, MD;  [facility];  Service: Endoscopy;  Laterality: N/A;   ESOPHAGOGASTRODUODENOSCOPY (EGD) WITH PROPOFOL N/A 10/08/2018   Procedure: ESOPHAGOGASTRODUODENOSCOPY (EGD) WITH PROPOFOL;  Surgeon: Carol Ada, MD;  [facility];  Service: Endoscopy;  Laterality: N/A;   EYE SURGERY Bilateral    cataracts   TUBAL LIGATION     Social History   Occupational History   Occupation: [employer]  Tobacco Use   Smoking status: Never   Smokeless tobacco: Never  Vaping Use   Vaping Use: Never used  Substance and Sexual Activity   Alcohol use: No    Alcohol/week: 0.0 standard drinks of alcohol   Drug use: No   Sexual activity: Never

## 2022-05-26 ENCOUNTER — Other Ambulatory Visit: Payer: Self-pay | Admitting: Gastroenterology

## 2022-06-02 ENCOUNTER — Encounter: Payer: Self-pay | Admitting: Neurology

## 2022-06-19 DIAGNOSIS — G4733 Obstructive sleep apnea (adult) (pediatric): Secondary | ICD-10-CM | POA: Diagnosis not present

## 2022-06-23 DIAGNOSIS — E1169 Type 2 diabetes mellitus with other specified complication: Secondary | ICD-10-CM | POA: Diagnosis not present

## 2022-06-23 DIAGNOSIS — M109 Gout, unspecified: Secondary | ICD-10-CM | POA: Diagnosis not present

## 2022-06-24 ENCOUNTER — Ambulatory Visit (INDEPENDENT_AMBULATORY_CARE_PROVIDER_SITE_OTHER): Payer: HMO | Admitting: Orthopaedic Surgery

## 2022-06-24 ENCOUNTER — Encounter: Payer: Self-pay | Admitting: Orthopaedic Surgery

## 2022-06-24 VITALS — BP 133/80 | HR 73 | Ht 64.0 in | Wt 234.0 lb

## 2022-06-24 DIAGNOSIS — M25561 Pain in right knee: Secondary | ICD-10-CM | POA: Diagnosis not present

## 2022-06-24 DIAGNOSIS — M1711 Unilateral primary osteoarthritis, right knee: Secondary | ICD-10-CM | POA: Diagnosis not present

## 2022-06-24 DIAGNOSIS — G8929 Other chronic pain: Secondary | ICD-10-CM

## 2022-06-24 DIAGNOSIS — Z981 Arthrodesis status: Secondary | ICD-10-CM

## 2022-06-24 DIAGNOSIS — M545 Low back pain, unspecified: Secondary | ICD-10-CM

## 2022-06-24 NOTE — Progress Notes (Unsigned)
Office Visit Note   Patient: Cathy Gonzalez           Date of Birth: [birth year]           MRN: [id number] Visit Date: 06/24/2022              Requested by: [name], [address] [place],  [place] [address] PCP: [name], MD   Assessment & Plan: Visit Diagnoses: No diagnosis found.  Plan: ***  Follow-Up Instructions: Return in about 4 months (around 10/24/2022).   Orders:  No orders of the defined types were placed in this encounter.  No orders of the defined types were placed in this encounter.     Procedures: No procedures performed   Clinical Data: No additional findings.   Subjective: Chief Complaint  Patient presents with   Right Knee - Pain, Follow-up   Lower Back - Pain, Follow-up    HPI  Review of Systems   Objective: Vital Signs: BP 133/80   Pulse 73   Ht '5\' 4"'$  (1.626 m)   Wt 234 lb (106.1 kg)   BMI 40.17 kg/m   Physical Exam  Ortho Exam  Specialty Comments:  No specialty comments available.  Imaging: No results found.   PMFS History: Patient Active Problem List   Diagnosis Date Noted   Pain due to onychomycosis of toenails of both feet 05/06/2022   Cervical stenosis of spine 02/24/2022   Spinal stenosis of cervical region 01/03/2022   Neck pain 12/03/2021   Abnormal liver function tests 02/12/2021   Diarrhea 02/12/2021   Gastro-esophageal reflux disease without esophagitis 81/82/9937   Periumbilical pain 16/96/7893   Personal history of colonic polyps 02/12/2021   Portal hypertension ([place]) 02/12/2021   Screening for malignant neoplasm of colon 02/12/2021   Type 2 diabetes mellitus without complications ([place]) 81/10/7508   Primary osteoarthritis, left hand 11/07/2020   Arthritis of carpometacarpal (CMC) joint of thumb 07/18/2020   Bilateral primary osteoarthritis of knee 06/20/2020   Bilateral primary osteoarthritis of hip 06/07/2020   Right thigh pain 05/09/2020   Low back pain 07/14/2019   OSA on CPAP  05/16/2019   Diabetic neuropathy associated with type 2 diabetes mellitus ([place]) 12/13/2018   Chronic pain of right knee 12/09/2018   Thumb pain, left 12/09/2018   Dizziness and giddiness 01/14/2016   Hepatic cirrhosis ([place]) 07/26/2015   Nocturnal seizures ([place]) 05/14/2015   Chronic hepatitis C without hepatic coma ([place]) 03/14/2015   Vertigo, central 02/11/2014   Difficulty walking 02/11/2014   Diabetes mellitus ([place]) 02/11/2014   Dysphagia 02/11/2014   OBESITY 07/26/2010   Essential hypertension, benign 07/26/2010   DEGENERATIVE JOINT DISEASE, GENERALIZED 07/26/2010   Past Medical History:  Diagnosis Date   Arthritis    Cataract    left cataract removed in 2017   Diabetes mellitus    Diabetic neuropathy (HCC)    Diabetic neuropathy, type II diabetes mellitus ([place])    Hepatitis C    took tx for    Hypertension    Hypokalemia    Knee pain    right    Morbidly obese (HCC)    Nocturnal seizures ([place]) 05/14/2015   no bad seizure since 1980's   OSA on CPAP    uses cpap 2-3 week   Plantar fasciitis    both feet    Family History  Problem Relation Age of Onset   Diabetes Mother    Heart attack Mother    Diabetes Sister  Diabetes Brother    Cancer Sister        breast   Breast cancer Sister    Seizures Neg Hx     Past Surgical History:  Procedure Laterality Date   ABDOMINAL HYSTERECTOMY     partial   ANTERIOR CERVICAL DECOMP/DISCECTOMY FUSION N/A 02/24/2022   Procedure: CERVICAL FOUR- CERVICAL FIVE ANTERIOR CERVICAL DECOMPRESSION/DISCECTOMY FUSION ONE LEVEL WITH ALLOGRAFT AND PLATE;  Surgeon: [name], MD;  Location: [facility];  Service: Orthopedics;  Laterality: N/A;   CATARACT EXTRACTION Left    COLONOSCOPY WITH PROPOFOL N/A 08/21/2015   Procedure: COLONOSCOPY WITH PROPOFOL;  Surgeon: Carol Ada, MD;  [facility];  Service: Endoscopy;  Laterality: N/A;   ESOPHAGOGASTRODUODENOSCOPY (EGD) WITH PROPOFOL N/A 08/21/2015   Procedure:  ESOPHAGOGASTRODUODENOSCOPY (EGD) WITH PROPOFOL;  Surgeon: Carol Ada, MD;  [facility];  Service: Endoscopy;  Laterality: N/A;   ESOPHAGOGASTRODUODENOSCOPY (EGD) WITH PROPOFOL N/A 10/08/2018   Procedure: ESOPHAGOGASTRODUODENOSCOPY (EGD) WITH PROPOFOL;  Surgeon: Carol Ada, MD;  [facility];  Service: Endoscopy;  Laterality: N/A;   EYE SURGERY Bilateral    cataracts   TUBAL LIGATION     Social History   Occupational History   Occupation: [employer]  Tobacco Use   Smoking status: Never   Smokeless tobacco: Never  Vaping Use   Vaping Use: Never used  Substance and Sexual Activity   Alcohol use: No    Alcohol/week: 0.0 standard drinks of alcohol   Drug use: No   Sexual activity: Never

## 2022-06-25 DIAGNOSIS — Z981 Arthrodesis status: Secondary | ICD-10-CM | POA: Insufficient documentation

## 2022-06-26 LAB — BASIC METABOLIC PANEL
BUN/Creatinine Ratio: 16 (calc) (ref 6–22)
BUN: 16 mg/dL (ref 7–25)
CO2: 28 mmol/L (ref 20–32)
Calcium: 9.9 mg/dL (ref 8.6–10.4)
Chloride: 107 mmol/L (ref 98–110)
Creat: 1.02 mg/dL — ABNORMAL HIGH (ref 0.60–1.00)
Glucose, Bld: 75 mg/dL (ref 65–99)
Potassium: 3.7 mmol/L (ref 3.5–5.3)
Sodium: 142 mmol/L (ref 135–146)

## 2022-06-26 LAB — URIC ACID: Uric Acid, Serum: 5.8 mg/dL (ref 2.5–7.0)

## 2022-06-30 DIAGNOSIS — F410 Panic disorder [episodic paroxysmal anxiety] without agoraphobia: Secondary | ICD-10-CM | POA: Diagnosis not present

## 2022-06-30 DIAGNOSIS — N189 Chronic kidney disease, unspecified: Secondary | ICD-10-CM | POA: Diagnosis not present

## 2022-06-30 DIAGNOSIS — E1122 Type 2 diabetes mellitus with diabetic chronic kidney disease: Secondary | ICD-10-CM | POA: Diagnosis not present

## 2022-06-30 DIAGNOSIS — E1169 Type 2 diabetes mellitus with other specified complication: Secondary | ICD-10-CM | POA: Diagnosis not present

## 2022-06-30 DIAGNOSIS — I10 Essential (primary) hypertension: Secondary | ICD-10-CM | POA: Diagnosis not present

## 2022-07-03 ENCOUNTER — Ambulatory Visit: Payer: HMO | Admitting: Physical Medicine and Rehabilitation

## 2022-07-05 DIAGNOSIS — E118 Type 2 diabetes mellitus with unspecified complications: Secondary | ICD-10-CM | POA: Diagnosis not present

## 2022-07-05 DIAGNOSIS — E785 Hyperlipidemia, unspecified: Secondary | ICD-10-CM | POA: Diagnosis not present

## 2022-07-05 DIAGNOSIS — I10 Essential (primary) hypertension: Secondary | ICD-10-CM | POA: Diagnosis not present

## 2022-07-09 ENCOUNTER — Other Ambulatory Visit: Payer: Self-pay

## 2022-07-09 ENCOUNTER — Encounter: Payer: Self-pay | Admitting: Internal Medicine

## 2022-07-09 ENCOUNTER — Ambulatory Visit (INDEPENDENT_AMBULATORY_CARE_PROVIDER_SITE_OTHER): Payer: HMO | Admitting: Internal Medicine

## 2022-07-09 ENCOUNTER — Ambulatory Visit: Payer: HMO | Admitting: Neurology

## 2022-07-09 VITALS — BP 143/84 | HR 87 | Temp 98.3°F | Ht 64.0 in | Wt 239.0 lb

## 2022-07-09 DIAGNOSIS — K7460 Unspecified cirrhosis of liver: Secondary | ICD-10-CM

## 2022-07-09 DIAGNOSIS — R7989 Other specified abnormal findings of blood chemistry: Secondary | ICD-10-CM

## 2022-07-09 NOTE — Progress Notes (Signed)
   Subjective:    Patient ID: Cathy Gonzalez, female    DOB: 1946-01-14, 76 y.o.   MRN: [id number]  HPI Cathy Gonzalez is here for follow up for [facility] screening She has a history of chronic hepatitis C s/p successful treatment and also a history of compensated cirhosis and on surveillance for [facility].   Last ultrasound March 2023 and no issues.     Review of Systems  Gastrointestinal:  Negative for diarrhea.  Skin:  Negative for rash.       Objective:   Physical Exam Eyes:     General: No scleral icterus. Pulmonary:     Effort: Pulmonary effort is normal.  Neurological:     Mental Status: She is alert.           Assessment & Plan:

## 2022-07-09 NOTE — Assessment & Plan Note (Signed)
She continues to have mild transaminitis for unclear causes but no high.  She may have some element of fatty liver.  Will continue to monitor.

## 2022-07-09 NOTE — Assessment & Plan Note (Signed)
To date, no cocerns and will continue with [facility] screening and she will continue to follow up with GI as needed for ? EGD/varices screening.

## 2022-07-16 ENCOUNTER — Other Ambulatory Visit: Payer: HMO

## 2022-07-21 ENCOUNTER — Ambulatory Visit
Admission: RE | Admit: 2022-07-21 | Discharge: 2022-07-21 | Disposition: A | Discharge status: Home / Self Care | Payer: HMO | Source: Ambulatory Visit | Attending: Internal Medicine | Admitting: Internal Medicine

## 2022-07-21 DIAGNOSIS — K7460 Unspecified cirrhosis of liver: Secondary | ICD-10-CM

## 2022-08-05 ENCOUNTER — Ambulatory Visit (INDEPENDENT_AMBULATORY_CARE_PROVIDER_SITE_OTHER): Payer: HMO | Admitting: Podiatry

## 2022-08-05 DIAGNOSIS — M79675 Pain in left toe(s): Secondary | ICD-10-CM

## 2022-08-05 DIAGNOSIS — E1149 Type 2 diabetes mellitus with other diabetic neurological complication: Secondary | ICD-10-CM | POA: Diagnosis not present

## 2022-08-05 DIAGNOSIS — E1140 Type 2 diabetes mellitus with diabetic neuropathy, unspecified: Secondary | ICD-10-CM

## 2022-08-05 DIAGNOSIS — B351 Tinea unguium: Secondary | ICD-10-CM

## 2022-08-05 DIAGNOSIS — M79674 Pain in right toe(s): Secondary | ICD-10-CM

## 2022-08-05 NOTE — Progress Notes (Signed)
This patient returns to my office for at risk foot care.  This patient requires this care by a professional since this patient will be at risk due to having type 2 diabetes. Patient has neuropathy.  This patient is unable to cut nails herself since the patient cannot reach her nails.These nails are painful walking and wearing shoes.  This patient presents for at risk foot care today.  General Appearance  Alert, conversant and in no acute stress.  Vascular  Dorsalis pedis and posterior tibial  pulses are palpable  bilaterally.  Capillary return is within normal limits  bilaterally. Temperature is within normal limits  bilaterally.  Neurologic  Senn-Weinstein monofilament wire test within normal limits  bilaterally. Muscle power within normal limits bilaterally.  Nails Thick disfigured discolored nails with subungual debris  from second  to fifth toes bilaterally. No evidence of bacterial infection or drainage bilaterally.  Orthopedic  No limitations of motion  feet .  No crepitus or effusions noted.  No bony pathology or digital deformities noted.  HAV  B/L.  Skin  normotropic skin with no porokeratosis noted bilaterally.  No signs of infections or ulcers noted.     Onychomycosis  Pain in right toes  Pain in left toes  Consent was obtained for treatment procedures.   Mechanical debridement of nails 2-5  bilaterally performed with a nail nipper.  Filed with dremel without incident.    Return office visit    3 months                  Told patient to return for periodic foot care and evaluation due to potential at risk complications.   Gardiner Barefoot DPM

## 2022-08-12 ENCOUNTER — Ambulatory Visit: Payer: Self-pay

## 2022-08-12 ENCOUNTER — Ambulatory Visit (INDEPENDENT_AMBULATORY_CARE_PROVIDER_SITE_OTHER): Payer: HMO | Admitting: Orthopaedic Surgery

## 2022-08-12 ENCOUNTER — Ambulatory Visit (INDEPENDENT_AMBULATORY_CARE_PROVIDER_SITE_OTHER): Payer: HMO

## 2022-08-12 ENCOUNTER — Encounter: Payer: Self-pay | Admitting: Orthopaedic Surgery

## 2022-08-12 VITALS — BP 147/86 | HR 84 | Ht 64.0 in | Wt 220.0 lb

## 2022-08-12 DIAGNOSIS — M79601 Pain in right arm: Secondary | ICD-10-CM

## 2022-08-12 DIAGNOSIS — M7541 Impingement syndrome of right shoulder: Secondary | ICD-10-CM

## 2022-08-12 NOTE — Progress Notes (Signed)
Office Visit Note   Patient: Cathy Gonzalez           Date of Birth: 10-12-1945           MRN: [id number] Visit Date: 08/12/2022              Requested by: [name], [address] [place],  [place] [address] PCP: [name], MD   Assessment & Plan: Visit Diagnoses:  1. Right arm pain   2. Impingement syndrome of right shoulder     Plan: Subacromial injection performed and she got good relief postinjection and can move her arm up overhead as well as impingement test was negative.  She may need to have subacromial bursitis or some partial rotator cuff tearing.  If she has persistent problems in several weeks she will call we can proceed with MRI scan of her shoulder to rule out rotator cuff tear.  She is happy with the results of the injection and is happy with her neck fusion.  Follow-Up Instructions: No follow-ups on file.   Orders:  Orders Placed This Encounter  Procedures   XR Cervical Spine 2 or 3 views   XR Shoulder Right   No orders of the defined types were placed in this encounter.     Procedures: No procedures performed   Clinical Data: No additional findings.   Subjective: Chief Complaint  Patient presents with   Right Upper Arm - Pain    HPI 76 year old female returns post C4-5 fusion 6 months ago with good relief of neck pain.  She states she has good rotation no numbness or tingling in her hands.  She has had problems with pain in her shoulder difficulty with abduction.  Problems reaching overhead.  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  Sometimes she has difficulty sleeping.  Radiographs today cervical spine shows her fusion is nicely healed.  Review of Systems all of systems update noncontributory HPI.  Positive for diabetes.   Objective: Vital Signs: BP (!) 147/86   Pulse 84   Ht '5\' 4"'$  (1.626 m)   Wt 220 lb (99.8 kg)   BMI 37.76 kg/m   Physical Exam Constitutional:      Appearance: She is well-developed.  HENT:     Head:  Normocephalic.     Right Ear: External ear normal.     Left Ear: External ear normal. There is no impacted cerumen.  Eyes:     Pupils: Pupils are equal, round, and reactive to light.  Neck:     Thyroid: No thyromegaly.     Trachea: No tracheal deviation.  Cardiovascular:     Rate and Rhythm: Normal rate.  Pulmonary:     Effort: Pulmonary effort is normal.  Abdominal:     Palpations: Abdomen is soft.  Musculoskeletal:     Cervical back: No rigidity.  Skin:    General: Skin is warm and dry.  Neurological:     Mental Status: She is alert and oriented to person, place, and time.  Psychiatric:        Behavior: Behavior normal.     Ortho Exam healed anterior cervical incision no brachial plexus tenderness.  Positive impingement right shoulder negative drop arm test.  She has discomfort with internal rotation more than hand posterior axillary line.  No subscap or external rotation weakness.  Negative drop arm test.  Specialty Comments:  No specialty comments available.  Imaging: XR Cervical Spine 2 or 3 views  Result  Date: 08/12/2022 AP lateral cervical spine demonstrates single level cervical fusion C4-5 with good incorporation.  No adjacent spondylolisthesis. Assessment: Satisfactory C4-5 fusion.    PMFS History: Patient Active Problem List   Diagnosis Date Noted   Impingement syndrome of right shoulder 08/12/2022   S/P cervical spinal fusion 06/25/2022   Pain due to onychomycosis of toenails of both feet 05/06/2022   Cervical stenosis of spine 02/24/2022   Spinal stenosis of cervical region 01/03/2022   Neck pain 12/03/2021   Abnormal liver function tests 02/12/2021   Diarrhea 02/12/2021   Gastro-esophageal reflux disease without esophagitis 40/97/3532   Periumbilical pain 99/24/2683   Personal history of colonic polyps 02/12/2021   Portal hypertension ([place]) 02/12/2021   Screening for malignant neoplasm of colon 02/12/2021   Type 2 diabetes mellitus without  complications ([place]) 41/96/2229   Primary osteoarthritis, left hand 11/07/2020   Arthritis of carpometacarpal (CMC) joint of thumb 07/18/2020   Bilateral primary osteoarthritis of knee 06/20/2020   Bilateral primary osteoarthritis of hip 06/07/2020   Right thigh pain 05/09/2020   Low back pain 07/14/2019   OSA on CPAP 05/16/2019   Diabetic neuropathy associated with type 2 diabetes mellitus ([place]) 12/13/2018   Chronic pain of right knee 12/09/2018   Thumb pain, left 12/09/2018   Dizziness and giddiness 01/14/2016   Hepatic cirrhosis ([place]) 07/26/2015   Nocturnal seizures ([place]) 05/14/2015   Chronic hepatitis C without hepatic coma ([place]) 03/14/2015   Vertigo, central 02/11/2014   Difficulty walking 02/11/2014   Diabetes mellitus ([place]) 02/11/2014   Dysphagia 02/11/2014   OBESITY 07/26/2010   Essential hypertension, benign 07/26/2010   DEGENERATIVE JOINT DISEASE, GENERALIZED 07/26/2010   Past Medical History:  Diagnosis Date   Arthritis    Cataract    left cataract removed in 2017   Diabetes mellitus    Diabetic neuropathy (HCC)    Diabetic neuropathy, type II diabetes mellitus ([place])    Hepatitis C    took tx for    Hypertension    Hypokalemia    Knee pain    right    Morbidly obese (HCC)    Nocturnal seizures ([place]) 05/14/2015   no bad seizure since 1980's   OSA on CPAP    uses cpap 2-3 week   Plantar fasciitis    both feet    Family History  Problem Relation Age of Onset   Diabetes Mother    Heart attack Mother    Diabetes Sister    Diabetes Brother    Cancer Sister        breast   Breast cancer Sister    Seizures Neg Hx     Past Surgical History:  Procedure Laterality Date   ABDOMINAL HYSTERECTOMY     partial   ANTERIOR CERVICAL DECOMP/DISCECTOMY FUSION N/A 02/24/2022   Procedure: CERVICAL FOUR- CERVICAL FIVE ANTERIOR CERVICAL DECOMPRESSION/DISCECTOMY FUSION ONE LEVEL WITH ALLOGRAFT AND PLATE;  Surgeon: [name], MD;  Location: [facility];  Service: Orthopedics;   Laterality: N/A;   CATARACT EXTRACTION Left    COLONOSCOPY WITH PROPOFOL N/A 08/21/2015   Procedure: COLONOSCOPY WITH PROPOFOL;  Surgeon: Carol Ada, MD;  [facility];  Service: Endoscopy;  Laterality: N/A;   ESOPHAGOGASTRODUODENOSCOPY (EGD) WITH PROPOFOL N/A 08/21/2015   Procedure: ESOPHAGOGASTRODUODENOSCOPY (EGD) WITH PROPOFOL;  Surgeon: Carol Ada, MD;  [facility];  Service: Endoscopy;  Laterality: N/A;   ESOPHAGOGASTRODUODENOSCOPY (EGD) WITH PROPOFOL N/A 10/08/2018   Procedure: ESOPHAGOGASTRODUODENOSCOPY (EGD) WITH PROPOFOL;  Surgeon: Carol Ada, MD;  [facility];  Service: Endoscopy;  Laterality: N/A;   EYE SURGERY Bilateral    cataracts   TUBAL LIGATION     Social History   Occupational History   Occupation: [employer]  Tobacco Use   Smoking status: Never   Smokeless tobacco: Never  Vaping Use   Vaping Use: Never used  Substance and Sexual Activity   Alcohol use: No    Alcohol/week: 0.0 standard drinks of alcohol   Drug use: No   Sexual activity: Never

## 2022-08-14 ENCOUNTER — Encounter (HOSPITAL_COMMUNITY): Payer: Self-pay | Admitting: Gastroenterology

## 2022-08-22 ENCOUNTER — Ambulatory Visit (HOSPITAL_COMMUNITY): Payer: HMO | Admitting: Certified Registered"

## 2022-08-22 ENCOUNTER — Other Ambulatory Visit: Payer: Self-pay

## 2022-08-22 ENCOUNTER — Ambulatory Visit (HOSPITAL_COMMUNITY)
Admission: RE | Admit: 2022-08-22 | Discharge: 2022-08-22 | Disposition: A | Discharge status: Home / Self Care | Payer: HMO | Source: Ambulatory Visit | Attending: Gastroenterology | Admitting: Gastroenterology

## 2022-08-22 ENCOUNTER — Ambulatory Visit (HOSPITAL_BASED_OUTPATIENT_CLINIC_OR_DEPARTMENT_OTHER): Payer: HMO | Admitting: Certified Registered"

## 2022-08-22 ENCOUNTER — Encounter (HOSPITAL_COMMUNITY): Payer: Self-pay | Admitting: Gastroenterology

## 2022-08-22 ENCOUNTER — Encounter (HOSPITAL_COMMUNITY)
Admission: RE | Disposition: A | Discharge status: Home / Self Care | Payer: Self-pay | Source: Ambulatory Visit | Attending: Gastroenterology

## 2022-08-22 DIAGNOSIS — D124 Benign neoplasm of descending colon: Secondary | ICD-10-CM | POA: Diagnosis not present

## 2022-08-22 DIAGNOSIS — K5730 Diverticulosis of large intestine without perforation or abscess without bleeding: Secondary | ICD-10-CM

## 2022-08-22 DIAGNOSIS — E119 Type 2 diabetes mellitus without complications: Secondary | ICD-10-CM | POA: Insufficient documentation

## 2022-08-22 DIAGNOSIS — D509 Iron deficiency anemia, unspecified: Secondary | ICD-10-CM | POA: Insufficient documentation

## 2022-08-22 DIAGNOSIS — Z7984 Long term (current) use of oral hypoglycemic drugs: Secondary | ICD-10-CM | POA: Diagnosis not present

## 2022-08-22 DIAGNOSIS — G4733 Obstructive sleep apnea (adult) (pediatric): Secondary | ICD-10-CM | POA: Insufficient documentation

## 2022-08-22 DIAGNOSIS — Z6839 Body mass index (BMI) 39.0-39.9, adult: Secondary | ICD-10-CM | POA: Insufficient documentation

## 2022-08-22 DIAGNOSIS — I10 Essential (primary) hypertension: Secondary | ICD-10-CM | POA: Insufficient documentation

## 2022-08-22 DIAGNOSIS — Z1211 Encounter for screening for malignant neoplasm of colon: Secondary | ICD-10-CM | POA: Diagnosis present

## 2022-08-22 DIAGNOSIS — Z86010 Personal history of colonic polyps: Secondary | ICD-10-CM

## 2022-08-22 HISTORY — PX: ESOPHAGOGASTRODUODENOSCOPY (EGD) WITH PROPOFOL: SHX5813

## 2022-08-22 HISTORY — PX: COLONOSCOPY WITH PROPOFOL: SHX5780

## 2022-08-22 HISTORY — PX: POLYPECTOMY: SHX5525

## 2022-08-22 LAB — GLUCOSE, CAPILLARY: Glucose-Capillary: 126 mg/dL — ABNORMAL HIGH (ref 70–99)

## 2022-08-22 SURGERY — ESOPHAGOGASTRODUODENOSCOPY (EGD) WITH PROPOFOL
Anesthesia: Monitor Anesthesia Care

## 2022-08-22 MED ORDER — SODIUM CHLORIDE 0.9 % IV SOLN: INTRAVENOUS | Status: DC

## 2022-08-22 MED ORDER — PROPOFOL 1000 MG/100ML IV EMUL
INTRAVENOUS | Status: AC
  Filled 2022-08-22: qty 100

## 2022-08-22 MED ORDER — LACTATED RINGERS IV SOLN: INTRAVENOUS | Status: DC

## 2022-08-22 MED ORDER — PROPOFOL 500 MG/50ML IV EMUL
INTRAVENOUS | Status: DC | PRN
  Administered 2022-08-22: 150 ug/kg/min via INTRAVENOUS

## 2022-08-22 MED ORDER — PROPOFOL 500 MG/50ML IV EMUL
INTRAVENOUS | Status: AC
  Filled 2022-08-22: qty 50

## 2022-08-22 MED ORDER — LIDOCAINE 2% (20 MG/ML) 5 ML SYRINGE
INTRAMUSCULAR | Status: DC | PRN
  Administered 2022-08-22: 100 mg via INTRAVENOUS

## 2022-08-22 MED ORDER — PROPOFOL 10 MG/ML IV BOLUS
INTRAVENOUS | Status: DC | PRN
  Administered 2022-08-22: 20 mg via INTRAVENOUS

## 2022-08-22 MED ORDER — LACTATED RINGERS IV SOLN: INTRAVENOUS | Status: DC | PRN

## 2022-08-22 SURGICAL SUPPLY — 25 items

## 2022-08-22 NOTE — Anesthesia Procedure Notes (Signed)
Procedure Name: MAC Date/Time: 08/22/2022 7:42 AM  Performed by: Eben Burow, CRNAPre-anesthesia Checklist: Patient identified, Emergency Drugs available, Suction available, Patient being monitored and Timeout performed Oxygen Delivery Method: Simple face mask Placement Confirmation: positive ETCO2

## 2022-08-22 NOTE — H&P (Signed)
Cathy Gonzalez HPI: Recently she noticed dark stools and she also developed an anemia, however, when she stopped her renal supplements her stools normalized in color.  The most recent HGB on 05/13/2022 was at 10.5 g/dL.  In the past she was evaluated for IDA.  Her nephrologist is arranging for her to have iron infusions.  Her EGD on 10/2016 was positive for two antral ulcers and her colonoscopy was normal at that time.  She continues with pantoprazole.  Her last EGD in 2020 showed that she had a bleeding site in the antrum, but it was not an AVM.  The area was successfully treated.   Past Medical History:  Diagnosis Date   Arthritis    Cataract    left cataract removed in 2017   Diabetes mellitus    Diabetic neuropathy ([place])    Diabetic neuropathy, type II diabetes mellitus ([place])    Hepatitis C    took tx for    Hypertension    Hypokalemia    Knee pain    right    Morbidly obese (HCC)    Nocturnal seizures ([place]) 05/14/2015   no bad seizure since 1980's   OSA on CPAP    uses cpap 2-3 week   Plantar fasciitis    both feet    Past Surgical History:  Procedure Laterality Date   ABDOMINAL HYSTERECTOMY     partial   ANTERIOR CERVICAL DECOMP/DISCECTOMY FUSION N/A 02/24/2022   Procedure: CERVICAL FOUR- CERVICAL FIVE ANTERIOR CERVICAL DECOMPRESSION/DISCECTOMY FUSION ONE LEVEL WITH ALLOGRAFT AND PLATE;  Surgeon: [name], MD;  Location: [facility];  Service: Orthopedics;  Laterality: N/A;   CATARACT EXTRACTION Left    COLONOSCOPY WITH PROPOFOL N/A 08/21/2015   Procedure: COLONOSCOPY WITH PROPOFOL;  Surgeon: Carol Ada, MD;  [facility];  Service: Endoscopy;  Laterality: N/A;   ESOPHAGOGASTRODUODENOSCOPY (EGD) WITH PROPOFOL N/A 08/21/2015   Procedure: ESOPHAGOGASTRODUODENOSCOPY (EGD) WITH PROPOFOL;  Surgeon: Carol Ada, MD;  [facility];  Service: Endoscopy;  Laterality: N/A;   ESOPHAGOGASTRODUODENOSCOPY (EGD) WITH PROPOFOL N/A 10/08/2018   Procedure:  ESOPHAGOGASTRODUODENOSCOPY (EGD) WITH PROPOFOL;  Surgeon: Carol Ada, MD;  [facility];  Service: Endoscopy;  Laterality: N/A;   EYE SURGERY Bilateral    cataracts   TUBAL LIGATION      Family History  Problem Relation Age of Onset   Diabetes Mother    Heart attack Mother    Diabetes Sister    Diabetes Brother    Cancer Sister        breast   Breast cancer Sister    Seizures Neg Hx     Social History:  reports that she has never smoked. She has never used smokeless tobacco. She reports that she does not drink alcohol and does not use drugs.  Allergies:  Allergies  Allergen Reactions   Acetaminophen     Other reaction(s): GI upset, constipation if taken regularly    Medications: Scheduled: Continuous:  sodium chloride     lactated ringers 10 mL/hr at 08/22/22 0708    Results for orders placed or performed during the hospital encounter of 08/22/22 (from the past 24 hour(s))  Glucose, capillary     Status: Abnormal   Collection Time: 08/22/22  7:05 AM  Result Value Ref Range   Glucose-Capillary 126 (H) 70 - 99 mg/dL     No results found.  ROS:  As stated above in the HPI otherwise negative.  Blood pressure (!) 151/76, temperature (!) 97.1 F (36.2  C), temperature source Temporal, resp. rate (!) 24, height '5\' 4"'$  (1.626 m), weight 104.8 kg, SpO2 97 %.    PE: Gen: NAD, Alert and Oriented HEENT:  [place]/AT, EOMI Neck: Supple, no LAD Lungs: CTA Bilaterally CV: RRR without M/G/R ABD: Soft, NTND, +BS, morbidly obese Ext: No C/C/E  Assessment/Plan: 1) IDA - EGD/colonoscopy.  Rena Hunke D 08/22/2022, 7:41 AM

## 2022-08-22 NOTE — Anesthesia Postprocedure Evaluation (Signed)
Anesthesia Post Note  Patient: Cathy Gonzalez  Procedure(s) Performed: ESOPHAGOGASTRODUODENOSCOPY (EGD) WITH PROPOFOL COLONOSCOPY WITH PROPOFOL POLYPECTOMY     Patient location during evaluation: Endoscopy Anesthesia Type: MAC Level of consciousness: oriented, awake and alert and awake Pain management: pain level controlled Vital Signs Assessment: post-procedure vital signs reviewed and stable Respiratory status: spontaneous breathing, nonlabored ventilation, respiratory function stable and patient connected to nasal cannula oxygen Cardiovascular status: blood pressure returned to baseline and stable Postop Assessment: no headache, no backache and no apparent nausea or vomiting Anesthetic complications: no   No notable events documented.  Last Vitals:  Vitals:   08/22/22 0829 08/22/22 0839  BP: (!) 150/87 (!) 145/77  Pulse: 82 76  Resp: (!) 21 16  Temp:    SpO2: 99% 95%    Last Pain:  Vitals:   08/22/22 0839  TempSrc:   PainSc: 0-No pain                 Santa Lighter

## 2022-08-22 NOTE — Op Note (Signed)
[facility] Patient Name: Cathy Gonzalez Procedure Date: 08/22/2022 MRN: [id number] Attending MD: Carol Ada , MD, [id number] Date of Birth: 06/05/1946 CSN: [id number] Age: 75 Admit Type: Outpatient Procedure:                Colonoscopy Indications:              High risk colon cancer surveillance: Personal                            history of colonic polyps Providers:                Carol Ada, MD, Cherylynn Ridges, Technician,                            Jefm Miles CRNA Referring MD:              Medicines:                 Complications:            No immediate complications. Estimated Blood Loss:     Estimated blood loss: none. Procedure:                Pre-Anesthesia Assessment:                           - Prior to the procedure, a History and Physical                            was performed, and patient medications and                            allergies were reviewed. The patient's tolerance of                            previous anesthesia was also reviewed. The risks                            and benefits of the procedure and the sedation                            options and risks were discussed with the patient.                            All questions were answered, and informed consent                            was obtained. Prior Anticoagulants: The patient has                            taken no anticoagulant or antiplatelet agents. ASA                            Grade Assessment: III - A patient with severe                            systemic disease. After  reviewing the risks and                            benefits, the patient was deemed in satisfactory                            condition to undergo the procedure.                           - Sedation was administered by an anesthesia                            professional. Deep sedation was attained.                           After obtaining informed consent, the colonoscope                             was passed under direct vision. Throughout the                            procedure, the patient's blood pressure, pulse, and                            oxygen saturations were monitored continuously. The                            [facility] ENDO PEDIATRIC COLONOSCOPE [id number] was                            introduced through the anus and advanced to the the                            cecum, identified by appendiceal orifice and                            ileocecal valve. The colonoscopy was performed                            without difficulty. The patient tolerated the                            procedure well. The quality of the bowel                            preparation was evaluated using the BBPS [facility]                            Bowel Preparation Scale) with scores of: Right                            Colon = 3 (entire mucosa seen well with no residual  staining, small fragments of stool or opaque                            liquid), Transverse Colon = 3 (entire mucosa seen                            well with no residual staining, small fragments of                            stool or opaque liquid) and Left Colon = 3 (entire                            mucosa seen well with no residual staining, small                            fragments of stool or opaque liquid). The total                            BBPS score equals 9. The quality of the bowel                            preparation was good. The ileocecal valve,                            appendiceal orifice, and rectum were photographed. Scope In: 7:57:57 AM Scope Out: 8:13:05 AM Scope Withdrawal Time: 0 hours 10 minutes 40 seconds  Total Procedure Duration: 0 hours 15 minutes 8 seconds  Findings:      A 2 mm polyp was found in the descending colon. The polyp was sessile.       The polyp was removed with a cold snare. Resection and retrieval were       complete.      A few small-mouthed  diverticula were found in the transverse colon. Impression:               - One 2 mm polyp in the descending colon, removed                            with a cold snare. Resected and retrieved.                           - Diverticulosis in the transverse colon. Moderate Sedation:      Not Applicable - Patient had care per Anesthesia. Recommendation:           - Patient has a contact number available for                            emergencies. The signs and symptoms of potential                            delayed complications were discussed with the                            patient. Return to normal activities tomorrow.  Written discharge instructions were provided to the                            patient.                           - Resume previous diet.                           - Continue present medications.                           - Await pathology results.                           - Repeat colonoscopy is not recommended for                            surveillance. Procedure Code(s):        --- Professional ---                           [phone], Colonoscopy, flexible; with removal of                            tumor(s), polyp(s), or other lesion(s) by snare                            technique Diagnosis Code(s):        --- Professional ---                           D12.4, Benign neoplasm of descending colon                           Z86.010, Personal history of colonic polyps                           K57.30, Diverticulosis of large intestine without                            perforation or abscess without bleeding CPT copyright 2022 [employer]. All rights reserved. The codes documented in this report are preliminary and upon coder review may  be revised to meet current compliance requirements. Carol Ada, MD Carol Ada, MD 08/22/2022 8:21:04 AM This report has been signed electronically. Number of Addenda: 0

## 2022-08-22 NOTE — Anesthesia Preprocedure Evaluation (Addendum)
Anesthesia Evaluation  Patient identified by MRN, date of birth, ID band Patient awake    Reviewed: Allergy & Precautions, NPO status , Patient's Chart, lab work & pertinent test results  Airway Mallampati: II  TM Distance: >3 FB Neck ROM: Full    Dental  (+) Teeth Intact, Dental Advisory Given, Chipped,    Pulmonary sleep apnea and Continuous Positive Airway Pressure Ventilation    Pulmonary exam normal breath sounds clear to auscultation       Cardiovascular hypertension, Pt. on medications Normal cardiovascular exam Rhythm:Regular Rate:Normal     Neuro/Psych Seizures -, Well Controlled,  S/p ACDF  Neuromuscular disease    GI/Hepatic ,GERD  ,,(+) Hepatitis -, CHistory of portal hypertentsion   Endo/Other  diabetes, Type 2, Oral Hypoglycemic Agents  Morbid obesity  Renal/GU negative Renal ROS     Musculoskeletal  (+) Arthritis ,    Abdominal   Peds  Hematology negative hematology ROS (+)   Anesthesia Other Findings Day of surgery medications reviewed with the patient.  Reproductive/Obstetrics                             Anesthesia Physical Anesthesia Plan  ASA: 3  Anesthesia Plan: MAC   Post-op Pain Management: Minimal or no pain anticipated   Induction: Intravenous  PONV Risk Score and Plan: 2 and TIVA and Treatment may vary due to age or medical condition  Airway Management Planned: Nasal Cannula and Natural Airway  Additional Equipment:   Intra-op Plan:   Post-operative Plan:   Informed Consent: I have reviewed the patients History and Physical, chart, labs and discussed the procedure including the risks, benefits and alternatives for the proposed anesthesia with the patient or authorized representative who has indicated his/her understanding and acceptance.     Dental advisory given  Plan Discussed with: CRNA and Anesthesiologist  Anesthesia Plan Comments:         Anesthesia Quick Evaluation

## 2022-08-22 NOTE — Op Note (Signed)
[facility] Patient Name: Cathy Gonzalez Procedure Date: 08/22/2022 MRN: [id number] Attending MD: Carol Ada , MD, [id number] Date of Birth: 11-22-1945 CSN: [id number] Age: 76 Admit Type: Outpatient Procedure:                Upper GI endoscopy Indications:              Iron deficiency anemia Providers:                Carol Ada, MD, Mikey College, RN, Cherylynn Ridges, Technician, Jefm Miles CRNA Referring MD:              Medicines:                Propofol per Anesthesia Complications:            No immediate complications. Estimated Blood Loss:     Estimated blood loss: none. Procedure:                Pre-Anesthesia Assessment:                           - Prior to the procedure, a History and Physical                            was performed, and patient medications and                            allergies were reviewed. The patient's tolerance of                            previous anesthesia was also reviewed. The risks                            and benefits of the procedure and the sedation                            options and risks were discussed with the patient.                            All questions were answered, and informed consent                            was obtained. Prior Anticoagulants: The patient has                            taken no anticoagulant or antiplatelet agents. ASA                            Grade Assessment: III - A patient with severe                            systemic disease. After reviewing the risks and  benefits, the patient was deemed in satisfactory                            condition to undergo the procedure.                           - Sedation was administered by an anesthesia                            professional. Deep sedation was attained.                           After obtaining informed consent, the endoscope was                            passed under  direct vision. Throughout the                            procedure, the patient's blood pressure, pulse, and                            oxygen saturations were monitored continuously. The                            PCF-HQ190L ([id number] Olympus colonoscope was                            introduced through the mouth, and advanced to the                            second part of duodenum. The upper GI endoscopy was                            accomplished without difficulty. The patient                            tolerated the procedure well. Scope In: Scope Out: Findings:      The esophagus was normal.      The stomach was normal.      The examined duodenum was normal. Impression:               - Normal esophagus.                           - Normal stomach.                           - Normal examined duodenum.                           - No specimens collected. Moderate Sedation:      Not Applicable - Patient had care per Anesthesia. Recommendation:           - Patient has a contact number available for  emergencies. The signs and symptoms of potential                            delayed complications were discussed with the                            patient. Return to normal activities tomorrow.                            Written discharge instructions were provided to the                            patient.                           - Resume previous diet.                           - Continue present medications. Procedure Code(s):        --- Professional ---                           [phone], Esophagogastroduodenoscopy, flexible,                            transoral; diagnostic, including collection of                            specimen(s) by brushing or washing, when performed                            (separate procedure) Diagnosis Code(s):        --- Professional ---                           D50.9, Iron deficiency anemia, unspecified CPT copyright 2022  American Medical Association. All rights reserved. The codes documented in this report are preliminary and upon coder review may  be revised to meet current compliance requirements. Carol Ada, MD Carol Ada, MD 08/22/2022 8:22:27 AM This report has been signed electronically. Number of Addenda: 0

## 2022-08-22 NOTE — Transfer of Care (Signed)
Immediate Anesthesia Transfer of Care Note  Patient: Cathy Gonzalez  Procedure(s) Performed: ESOPHAGOGASTRODUODENOSCOPY (EGD) WITH PROPOFOL COLONOSCOPY WITH PROPOFOL POLYPECTOMY  Patient Location: PACU and Endoscopy Unit  Anesthesia Type:MAC  Level of Consciousness: awake, alert , and patient cooperative  Airway & Oxygen Therapy: Patient Spontanous Breathing and Patient connected to face mask oxygen  Post-op Assessment: Report given to RN and Post -op Vital signs reviewed and stable  Post vital signs: Reviewed and stable  Last Vitals:  Vitals Value Taken Time  BP 145/77 08/22/22 0820  Temp 37.1 C 08/22/22 0819  Pulse 86 08/22/22 0824  Resp 19 08/22/22 0824  SpO2 99 % 08/22/22 0824  Vitals shown include unvalidated device data.  Last Pain:  Vitals:   08/22/22 0819  TempSrc: Temporal  PainSc: 0-No pain         Complications: No notable events documented.

## 2022-08-22 NOTE — Discharge Instructions (Signed)

## 2022-08-24 ENCOUNTER — Encounter (HOSPITAL_COMMUNITY): Payer: Self-pay | Admitting: Gastroenterology

## 2022-08-26 LAB — SURGICAL PATHOLOGY

## 2022-09-11 ENCOUNTER — Telehealth: Payer: Self-pay | Admitting: Neurology

## 2022-09-11 DIAGNOSIS — G4733 Obstructive sleep apnea (adult) (pediatric): Secondary | ICD-10-CM

## 2022-09-11 NOTE — Telephone Encounter (Signed)
I have reached out to the pt via mychart.   I have also sent mask refit order to [facility] for processing.

## 2022-09-11 NOTE — Telephone Encounter (Signed)
Pt said think had a seizure last night because tongue bit on the left side. Have been off CPAP due to mask not seal. Could you send a prescription for CPAP mask and a earlier appt? Would like a call from the nurse.

## 2022-09-11 NOTE — Addendum Note (Signed)
Addended by: Verlin Grills on: 09/11/2022 03:53 PM   Modules accepted: Orders

## 2022-09-14 NOTE — Telephone Encounter (Signed)
Thanks for scheduling her for a sooner appointment. She likely needs a second medication. I will discuss this week at her appointment. I would recommend no driving until seizure free for 6 months.

## 2022-09-16 NOTE — Progress Notes (Unsigned)
PATIENT: Cathy Gonzalez DOB: 1946-07-30  REASON FOR VISIT: follow up HISTORY FROM: patient PRIMARY NEUROLOGIST: Dr. Brett Fairy  HISTORY OF PRESENT ILLNESS: Today 09/17/22  Cathy Gonzalez here today for follow-up due to recurrent seizure. She was using CPAP, after about 3 weeks her face mask doesn't get a new seal, she was not wearing CPAP that night. She woke up the next morning, had bitten her left tongue, she felt tired, head was bloated. 2 weeks prior had started Repatha, that day had sinus issues, took Muccinex that night. Has remained on Keppra 1500 mg twice a day.  In 2016 she was previously treated for nocturnal seizures with carbamazepine.  She was switched to Keppra due to treatment for hepatitis C.  Has had nocturnal seizures since 1970's. Has cirrhosis. Would like a new CPAP machine, claims falling apart. Would like MRI of the brain, given chronic gait instability more than she thinks normal for age, fullness in her head (could be sinuses), feels something isn't right. Most seizures triggered by stress, nothing stressful right now. What caused it?  Review of CPAP data 08/18/2022-09/16/2022 23/30 days 77% > 4 hours 19 days 63%, leak 11.3, AHI 1.4  05/01/22 SS: Cathy Gonzalez is here today for follow-up.  Is on Keppra for seizures. On 02/24/22 had cervical fusion with Dr. Lorin Mercy, it went well.  Is on CPAP.  She could not use CPAP due to wearing a soft neck brace for period of time. Denies any recent seizure. On Monday, she took her CPAP in for service. Is ready to get back to using.  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  No falls.  Update 01/07/2021 SS: Cathy Gonzalez is a 76 year old female with history of obesity, diabetes, diabetic peripheral neuropathy, and nocturnal seizures.  She is on CPAP. She is on Keppra for seizures, also Lyrica for neuropathy filled by PCP.  She is doing overall well, may have had a small seizure 1-2 years ago, but isn't sure on timeframe.  Seizures are always  nocturnal, will wake up the next morning bit her tongue, feel drowsy the next day.  Are often brought on by stress.  She lives alone, drives a car.  Has been prescribed lorazepam to take as needed for panic attacks.  She has a lot of stress with her grown children.  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  Claims mostly compliant with CPAP, only misses if she doesn't have masks.  Was dealing with a poorly healing wound to her left lower leg, is now resolved.  Last A1c was around 6.5.  Here today for evaluation unaccompanied.  Update 01/02/2020 SS: Cathy Gonzalez is a 76 year old female with history of obesity, diabetes, diabetic peripheral neuropathy, and nocturnal seizures.  She is on CPAP.  She has had 1 brief nocturnal seizure in December 2019, associated with significant stress.  She remains on Keppra and Lyrica.  She has not had recurrent seizure.  Her neuropathy is doing better.  Her diabetes is under good control, recent A1c was 5.6.  She has been able to lose 30 pounds.  She says her gait and balance is more stable.  She has not had any falls.  She continues to use CPAP, but is having issues with the tubing leaking, despite getting new tubing.  She presents today for evaluation accompanied by her great-grandson.  She denies any new problems or concerns.  HISTORY 12/13/2018 Dr. Jannifer Franklin: Cathy Gonzalez is a 76 year old  right-handed black female with a history of obesity, diabetes, diabetic peripheral neuropathy, and nocturnal seizures.  The patient also indicates that she has sleep apnea, she has a CPAP machine but she does not tolerate the mask.  The patient indicates that when she tries to use a humidifier with the mask she has problems with the water getting in her throat, when she does not use a humidifier, her mouth dries out.  She wishes to have reevaluation of this issue.  The patient has had 1 brief nocturnal seizure in December 2019 associated with the time of stress when her sister and her  sister's son died.  The patient has otherwise gone well over a year without any seizures, she is on Keppra taking 1500 mg twice daily.  The patient tolerates the drug fairly well.  She is also on Lyrica taking 300 mg twice daily.  She does have some mild gait instability, she has not had any falls.   REVIEW OF SYSTEMS: Out of a complete 14 system review of symptoms, the patient complains only of the following symptoms, and all other reviewed systems are negative.  See HPI  ALLERGIES: Allergies  Allergen Reactions   Acetaminophen     Other reaction(s): GI upset, constipation if taken regularly    HOME MEDICATIONS: Outpatient Medications Prior to Visit  Medication Sig Dispense Refill   amLODipine (NORVASC) 10 MG tablet Take 5 mg by mouth daily.     Blood Glucose Monitoring Suppl (ONE TOUCH ULTRA 2) w/Device KIT 2 (two) times daily. as directed     clotrimazole (LOTRIMIN) 1 % cream Apply 1 Application topically 2 (two) times daily.     colchicine 0.6 MG tablet Take 0.6 mg by mouth at bedtime.     diclofenac Sodium (VOLTAREN) 1 % GEL Apply 2 g topically in the morning and at bedtime.     empagliflozin (JARDIANCE) 10 MG TABS tablet TAKE 1 TABLET BY MOUTH ONCE DAILY BEFORE BREAKFAST 90 tablet 3   ezetimibe (ZETIA) 10 MG tablet Take 10 mg by mouth at bedtime.      furosemide (LASIX) 40 MG tablet Take 40 mg by mouth at bedtime.     Insulin Aspart, w/Niacinamide, (FIASP) 100 UNIT/ML SOLN Inject 3-5 Units into the skin daily as needed (Blood sugar <150).     levETIRAcetam (KEPPRA) 750 MG tablet Take 2 tablets (1,500 mg total) by mouth 2 (two) times daily. 360 tablet 4   metFORMIN (GLUCOPHAGE) 500 MG tablet Take 500 mg by mouth at bedtime.      methocarbamol (ROBAXIN) 500 MG tablet Take 1 tablet (500 mg total) by mouth every 6 (six) hours as needed for muscle spasms. 60 tablet 0   montelukast (SINGULAIR) 10 MG tablet Take 10 mg by mouth at bedtime.     ONE TOUCH ULTRA TEST test strip   5    ONETOUCH DELICA LANCETS 84Z MISC   3   potassium chloride SA (KLOR-CON M) 20 MEQ tablet Take 20 mEq by mouth daily.     pregabalin (LYRICA) 300 MG capsule Take 1 capsule (300 mg total) by mouth 2 (two) times daily. 60 capsule 0   REPATHA SURECLICK 660 MG/ML SOAJ Inject 140 mg as directed as directed. Every 2 weeks     sucralfate (CARAFATE) 1 G tablet Take 1 g by mouth in the morning, at noon, and at bedtime.     telmisartan (MICARDIS) 80 MG tablet Take 80 mg by mouth daily.   1   No facility-administered  medications prior to visit.    PAST MEDICAL HISTORY: Past Medical History:  Diagnosis Date   Arthritis    Cataract    left cataract removed in 2017   Diabetes mellitus    Diabetic neuropathy ([place])    Diabetic neuropathy, type II diabetes mellitus ([place])    Hepatitis C    took tx for    Hypertension    Hypokalemia    Knee pain    right    Morbidly obese ([facility])    Nocturnal seizures ([place]) 05/14/2015   no bad seizure since 1980's   OSA on CPAP    uses cpap 2-3 week   Plantar fasciitis    both feet    PAST SURGICAL HISTORY: Past Surgical History:  Procedure Laterality Date   ABDOMINAL HYSTERECTOMY     partial   ANTERIOR CERVICAL DECOMP/DISCECTOMY FUSION N/A 02/24/2022   Procedure: CERVICAL FOUR- CERVICAL FIVE ANTERIOR CERVICAL DECOMPRESSION/DISCECTOMY FUSION ONE LEVEL WITH ALLOGRAFT AND PLATE;  Surgeon: [name], MD;  Location: [facility];  Service: Orthopedics;  Laterality: N/A;   CATARACT EXTRACTION Left    COLONOSCOPY WITH PROPOFOL N/A 08/21/2015   Procedure: COLONOSCOPY WITH PROPOFOL;  Surgeon: Carol Ada, MD;  Location: [facility];  Service: Endoscopy;  Laterality: N/A;   COLONOSCOPY WITH PROPOFOL N/A 08/22/2022   Procedure: COLONOSCOPY WITH PROPOFOL;  Surgeon: Carol Ada, MD;  Location: [facility];  Service: Gastroenterology;  Laterality: N/A;   ESOPHAGOGASTRODUODENOSCOPY (EGD) WITH PROPOFOL N/A 08/21/2015   Procedure: ESOPHAGOGASTRODUODENOSCOPY (EGD) WITH  PROPOFOL;  Surgeon: Carol Ada, MD;  Location: [facility];  Service: Endoscopy;  Laterality: N/A;   ESOPHAGOGASTRODUODENOSCOPY (EGD) WITH PROPOFOL N/A 10/08/2018   Procedure: ESOPHAGOGASTRODUODENOSCOPY (EGD) WITH PROPOFOL;  Surgeon: Carol Ada, MD;  Location: [facility];  Service: Endoscopy;  Laterality: N/A;   ESOPHAGOGASTRODUODENOSCOPY (EGD) WITH PROPOFOL N/A 08/22/2022   Procedure: ESOPHAGOGASTRODUODENOSCOPY (EGD) WITH PROPOFOL;  Surgeon: Carol Ada, MD;  Location: [facility];  Service: Gastroenterology;  Laterality: N/A;   EYE SURGERY Bilateral    cataracts   POLYPECTOMY  08/22/2022   Procedure: POLYPECTOMY;  Surgeon: Carol Ada, MD;  Location: [facility];  Service: Gastroenterology;;   TUBAL LIGATION      FAMILY HISTORY: Family History  Problem Relation Age of Onset   Diabetes Mother    Heart attack Mother    Diabetes Sister    Diabetes Brother    Cancer Sister        breast   Breast cancer Sister    Seizures Neg Hx     SOCIAL HISTORY: Social History   Socioeconomic History   Marital status: Single    Spouse name: Not on file   Number of children: 4   Years of education: 12   Highest education level: Not on file  Occupational History   Occupation: [employer]  Tobacco Use   Smoking status: Never   Smokeless tobacco: Never  Vaping Use   Vaping Use: Never used  Substance and Sexual Activity   Alcohol use: No    Alcohol/week: 0.0 standard drinks of alcohol   Drug use: No   Sexual activity: Never  Other Topics Concern   Not on file  Social History Narrative   Patient occasionally drinks caffeine.   Patient is right handed.   Social Determinants of Health   Financial Resource Strain: Not on file  Food Insecurity: Food Insecurity Present (04/10/2020)   Hunger Vital Sign    Worried About Running Out of Food in the Last Year: Sometimes true  Ran Out of Food in the Last Year: Never true  Transportation Needs: No Transportation Needs (04/10/2020)    PRAPARE - Hydrologist (Medical): No    Lack of Transportation (Non-Medical): No  Physical Activity: Not on file  Stress: Not on file  Social Connections: Not on file  Intimate Partner Violence: Not on file   PHYSICAL EXAM  Vitals:   09/17/22 1518  BP: 122/67  Pulse: 98  Weight: 213 lb (96.6 kg)  Height: _0  (1.626 m)   Body mass index is 36.56 kg/m.  Generalized: Well developed, in no acute distress   Neurological examination  Mentation: Alert oriented to time, place, history taking. Follows all commands speech and language fluent Cranial nerve II-XII: Pupils were equal round reactive to light. Extraocular movements were full, visual field were full on confrontational test. Facial sensation and strength were normal. Head turning and shoulder shrug were normal and symmetric. Motor: The motor testing reveals 5 over 5 strength of all 4 extremities. Good symmetric motor tone is noted throughout.  Sensory: Sensory testing is intact to soft touch on all 4 extremities. No evidence of extinction is noted.  Coordination: Cerebellar testing reveals good finger-nose-finger and heel-to-shin bilaterally.  Gait and station: Gait is steady, slightly wide-based, but independent Reflexes: Deep tendon reflexes are symmetric but decreased.  DIAGNOSTIC DATA (LABS, IMAGING, TESTING) - I reviewed patient records, labs, notes, testing and imaging myself where available.  Lab Results  Component Value Date   WBC 6.6 02/17/2022   HGB 13.9 02/17/2022   HCT 41.3 02/17/2022   MCV 89.6 02/17/2022   PLT 299 02/17/2022      Component Value Date/Time   NA 142 06/24/2022 1124   NA 140 08/11/2016 0833   K 3.7 06/24/2022 1124   CL 107 06/24/2022 1124   CO2 28 06/24/2022 1124   GLUCOSE 75 06/24/2022 1124   BUN 16 06/24/2022 1124   BUN 23 08/11/2016 0833   CREATININE 1.02 (H) 06/24/2022 1124   CALCIUM 9.9 06/24/2022 1124   PROT 6.9 02/17/2022 0900   PROT 7.2 08/11/2016  0833   ALBUMIN 3.9 02/17/2022 0900   ALBUMIN 4.5 08/11/2016 0833   AST 41 02/17/2022 0900   ALT 54 (H) 02/17/2022 0900   ALKPHOS 89 02/17/2022 0900   BILITOT 0.6 02/17/2022 0900   BILITOT <0.2 08/11/2016 0833   GFRNONAA 44 (L) 02/17/2022 0900   GFRNONAA 53 (L) 10/20/2016 1549   GFRAA 52 (L) 02/16/2017 1325   GFRAA 61 10/20/2016 1549   Lab Results  Component Value Date   CHOL 216 (H) 02/11/2014   HDL 65 02/11/2014   LDLCALC 133 (H) 02/11/2014   TRIG 92 02/11/2014   CHOLHDL 3.3 02/11/2014   Lab Results  Component Value Date   HGBA1C 6.1 (H) 02/17/2022   No results found for: "VITAMINB12" Lab Results  Component Value Date   TSH 1.680 09/10/2020   ASSESSMENT AND PLAN 76 y.o. year old female  has a past medical history of Arthritis, Cataract, Diabetes mellitus, Diabetic neuropathy ([place]), Diabetic neuropathy, type II diabetes mellitus ([place]), Hepatitis C, Hypertension, Hypokalemia, Knee pain, Morbidly obese ([place]), Nocturnal seizures ([place]) (05/14/2015), OSA on CPAP, and Plantar fasciitis. here with:  1.  Nocturnal seizures 2.  Sleep apnea, on CPAP 3.  Diabetic peripheral neuropathy 4.  Recent cervical spine fusion  -Recent nocturnal seizure on Keppra 1500 mg twice a day, increase to 1500/2000 mg daily  -Considered other medication options but difficult given history  of liver cirrhosis -No driving until seizure-free for 6 months -Order HST in order to get new CPAP machine, reports hers is > 25 years old. HST 2020 showed severe OSA, AHI 60.6/h, we did not collect ESS given length of visit, may have to call to assess if needed for HST approval -Check MRI of the brain with and without contrast given concern for gait instability, recent seizure, head fullness (MRI brain May 2015 showed age advanced periventricular and subcortical white matter disease likely chronic microvascular ischemia) -Check 48 hour Ambulatory EEG  -Check BMP since using contrast with MRI  -Follow up in 6 months  with Dr. Kathyrn Sheriff, AGNP-C, DNP 09/17/2022, 3:41 PM [facility] [address] [place], [place] [address] [phone]

## 2022-09-17 ENCOUNTER — Ambulatory Visit (INDEPENDENT_AMBULATORY_CARE_PROVIDER_SITE_OTHER): Payer: HMO | Admitting: Neurology

## 2022-09-17 ENCOUNTER — Encounter: Payer: Self-pay | Admitting: Neurology

## 2022-09-17 VITALS — BP 122/67 | HR 98 | Ht 64.0 in | Wt 213.0 lb

## 2022-09-17 DIAGNOSIS — G4733 Obstructive sleep apnea (adult) (pediatric): Secondary | ICD-10-CM | POA: Diagnosis not present

## 2022-09-17 DIAGNOSIS — R569 Unspecified convulsions: Secondary | ICD-10-CM

## 2022-09-17 MED ORDER — LEVETIRACETAM 1000 MG PO TABS
ORAL_TABLET | ORAL | 3 refills | Status: DC
  Filled 2023-05-16: qty 320, 90d supply, fill #0

## 2022-09-17 MED ORDER — LEVETIRACETAM 750 MG PO TABS: 1500.0000 mg | ORAL_TABLET | Freq: Two times a day (BID) | ORAL | 4 refills | Status: DC

## 2022-09-17 MED ORDER — CLOBAZAM 10 MG PO TABS: 5.0000 mg | ORAL_TABLET | Freq: Every day | ORAL | 1 refills | Status: DC

## 2022-09-17 NOTE — Patient Instructions (Addendum)
Increase Keppra, continue 1500 mg in the morning, increase to 2000 mg in the evening Check home sleep study to qualify for new CPAP machine No driving until seizure free 6 months Check MRI of the brain at your request given recent seizure See you back in 6 months   Meds ordered this encounter  Medications   DISCONTD: levETIRAcetam (KEPPRA) 750 MG tablet    Sig: Take 2 tablets (1,500 mg total) by mouth 2 (two) times daily.    Dispense:  360 tablet    Refill:  4   DISCONTD: cloBAZam (ONFI) 10 MG tablet    Sig: Take 0.5 tablets (5 mg total) by mouth at bedtime.    Dispense:  30 tablet    Refill:  1   levETIRAcetam (KEPPRA) 1000 MG tablet    Sig: Take 1.5 tablets in the morning, take 2 tablets at bedtime    Dispense:  320 tablet    Refill:  3    Cancel ONFI

## 2022-09-18 ENCOUNTER — Telehealth: Payer: Self-pay | Admitting: Neurology

## 2022-09-18 LAB — BASIC METABOLIC PANEL
BUN/Creatinine Ratio: 21 (ref 12–28)
BUN: 25 mg/dL (ref 8–27)
CO2: 25 mmol/L (ref 20–29)
Calcium: 9.8 mg/dL (ref 8.7–10.3)
Chloride: 102 mmol/L (ref 96–106)
Creatinine, Ser: 1.21 mg/dL — ABNORMAL HIGH (ref 0.57–1.00)
Glucose: 142 mg/dL — ABNORMAL HIGH (ref 70–99)
Potassium: 4.2 mmol/L (ref 3.5–5.2)
Sodium: 142 mmol/L (ref 134–144)
eGFR: 47 mL/min/{1.73_m2} — ABNORMAL LOW (ref >59)

## 2022-09-18 NOTE — Telephone Encounter (Signed)
[facility] adv NPR sent to GI [phone] 

## 2022-09-24 ENCOUNTER — Telehealth: Payer: Self-pay | Admitting: Neurology

## 2022-09-24 ENCOUNTER — Telehealth: Payer: Self-pay

## 2022-09-24 NOTE — Telephone Encounter (Signed)
Ambulatory EEG sent to [facility] intake team.

## 2022-09-24 NOTE — Telephone Encounter (Signed)
HTA pending faxed notes 

## 2022-10-08 ENCOUNTER — Ambulatory Visit: Payer: HMO | Admitting: Physical Medicine and Rehabilitation

## 2022-10-09 ENCOUNTER — Ambulatory Visit: Payer: HMO | Admitting: Physical Medicine and Rehabilitation

## 2022-10-12 ENCOUNTER — Ambulatory Visit
Admission: RE | Admit: 2022-10-12 | Discharge: 2022-10-12 | Disposition: A | Discharge status: Home / Self Care | Payer: HMO | Source: Ambulatory Visit | Attending: Neurology | Admitting: Neurology

## 2022-10-12 DIAGNOSIS — R569 Unspecified convulsions: Secondary | ICD-10-CM

## 2022-10-12 MED ORDER — GADOPICLENOL 0.5 MMOL/ML IV SOLN
10.0000 mL | Freq: Once | INTRAVENOUS | Status: AC | PRN
  Administered 2022-10-12: 10 mL via INTRAVENOUS

## 2022-10-14 ENCOUNTER — Ambulatory Visit (INDEPENDENT_AMBULATORY_CARE_PROVIDER_SITE_OTHER): Payer: PPO | Admitting: Podiatry

## 2022-10-14 ENCOUNTER — Encounter: Payer: Self-pay | Admitting: Podiatry

## 2022-10-14 DIAGNOSIS — M79674 Pain in right toe(s): Secondary | ICD-10-CM

## 2022-10-14 DIAGNOSIS — M79675 Pain in left toe(s): Secondary | ICD-10-CM

## 2022-10-14 DIAGNOSIS — B351 Tinea unguium: Secondary | ICD-10-CM

## 2022-10-14 NOTE — Telephone Encounter (Signed)
pt got a new HTA member ID # pending faxed notes   HST patient is scheduled at [facility] for 11/04/22 at 10:30 AM.  Mailed packet to the patient.

## 2022-10-14 NOTE — Patient Instructions (Signed)
The pads we discussed are called metatarsal pads, you can buy more from my office or from the drugstore or online on [employer]

## 2022-10-14 NOTE — Progress Notes (Signed)
  Subjective:  Patient ID: Cathy Gonzalez, female    DOB: July 12, 1946,  MRN: [id number]  Chief Complaint  Patient presents with   Nail Problem    NAIL TRIM/ My feet hurt on top and bottom and they swell.   Diabetes    Diabetic foot care    77 y.o. female presents with the above complaint. History confirmed with patient.  Still has burning pain she uses the diclofenac gel which has been helpful.  Nails are thickened elongated causing discomfort and pain  Objective:  Physical Exam: warm, good capillary refill, no trophic changes or ulcerative lesions, and normal DP and PT pulses. Left Foot: dystrophic yellowed discolored nail plates with subungual debris Right Foot: dystrophic yellowed discolored nail plates with subungual debris Assessment:   1. Pain due to onychomycosis of toenails of both feet      Plan:  Patient was evaluated and treated and all questions answered.  Discussed the etiology and treatment options for the condition in detail with the patient. Educated patient on the topical and oral treatment options for mycotic nails. Recommended debridement of the nails today. Sharp and mechanical debridement performed of all painful and mycotic nails today. Nails debrided in length and thickness using a nail nipper to level of comfort. Discussed treatment options including appropriate shoe gear. Follow up as needed for painful nails.  Patient educated on diabetes. Discussed proper diabetic foot care and discussed risks and complications of disease. Educated patient in depth on reasons to return to the office immediately should he/she discover anything concerning or new on the feet. All questions answered. Discussed proper shoes as well.    Return in about 3 months (around 01/13/2023) for at risk diabetic foot care.

## 2022-10-16 NOTE — Telephone Encounter (Signed)
Updated insurance info  HST- HTA no auth req ref # H[place] on 10/16/22   Patient is scheduled at [facility] for 11/04/22 at 10:30 AM.

## 2022-10-18 IMAGING — RF DG CERVICAL SPINE 2 OR 3 VIEWS
1 series · 2 of 2 positions shown · non-contrast
Comparison: Prior study

CLINICAL DATA: C4-C5 fusion.

EXAM:
CERVICAL SPINE - 2 VIEW

[Series 1: run · 2 of 2 slices shown]
[im 1/2]
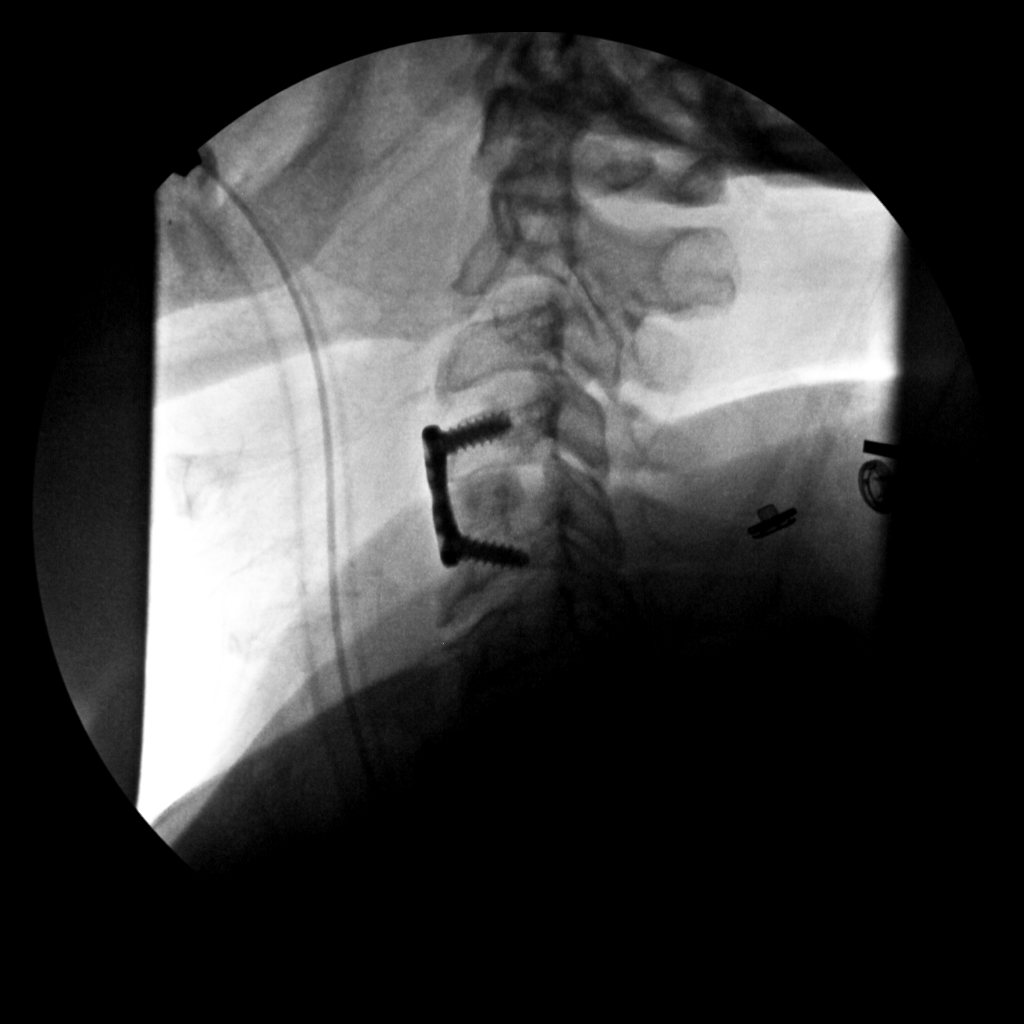
[im 2/2]
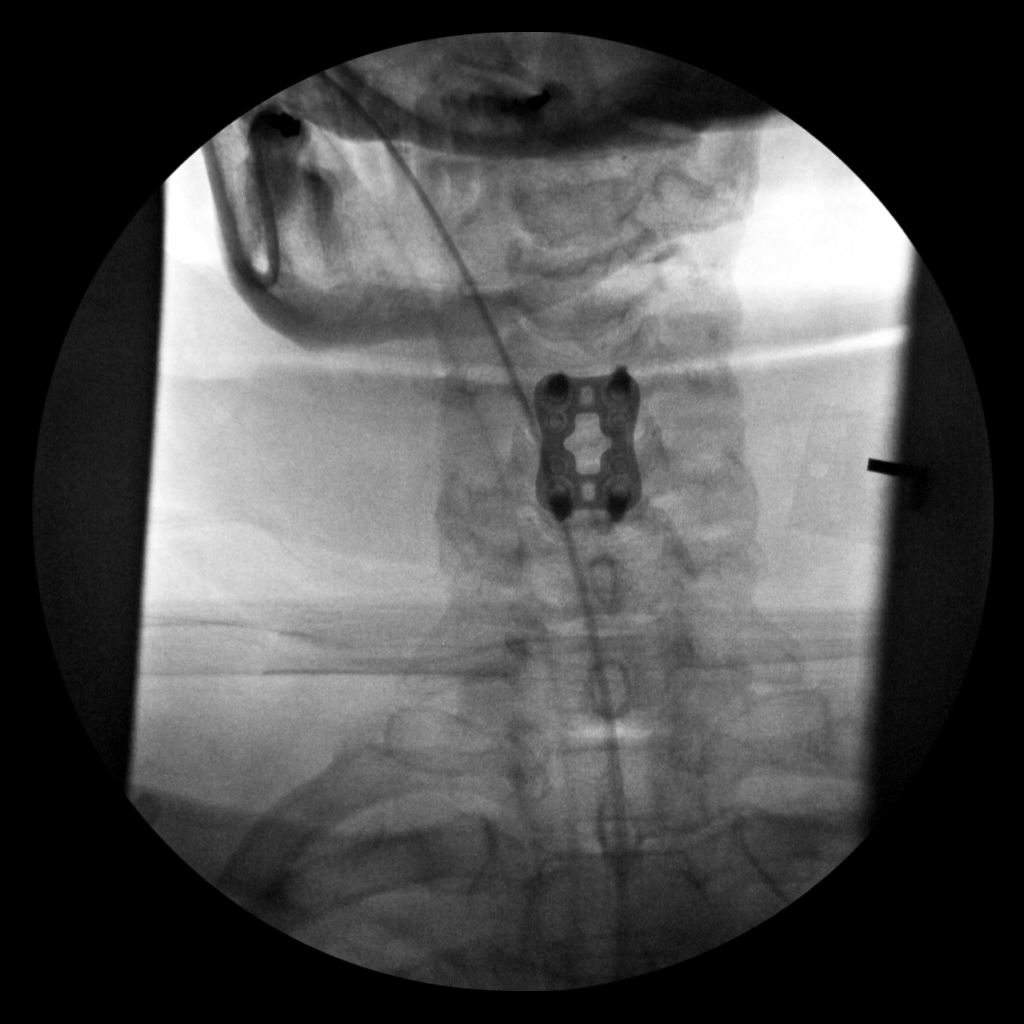

[2 of 2 positions shown; findings below may reference images not displayed]

FINDINGS: Two intraoperative spot views of the cervical spine are submitted
postoperatively for interpretation.

Anterior plate screw fixation and interbody fusion material at C4-C5
noted. No gross complicating features noted.
IMPRESSION: Anterior fusion at C4-C5 without gross complicating features.

## 2022-10-19 ENCOUNTER — Encounter: Payer: Self-pay | Admitting: Neurology

## 2022-10-21 ENCOUNTER — Ambulatory Visit: Payer: HMO | Admitting: Podiatry

## 2022-10-22 DIAGNOSIS — E11622 Type 2 diabetes mellitus with other skin ulcer: Secondary | ICD-10-CM | POA: Diagnosis not present

## 2022-10-22 DIAGNOSIS — J019 Acute sinusitis, unspecified: Secondary | ICD-10-CM | POA: Diagnosis not present

## 2022-10-22 DIAGNOSIS — L89321 Pressure ulcer of left buttock, stage 1: Secondary | ICD-10-CM | POA: Diagnosis not present

## 2022-10-22 DIAGNOSIS — I10 Essential (primary) hypertension: Secondary | ICD-10-CM | POA: Diagnosis not present

## 2022-10-27 ENCOUNTER — Encounter: Payer: Self-pay | Admitting: Neurology

## 2022-10-28 ENCOUNTER — Ambulatory Visit (INDEPENDENT_AMBULATORY_CARE_PROVIDER_SITE_OTHER): Payer: PPO | Admitting: Orthopaedic Surgery

## 2022-10-28 ENCOUNTER — Encounter: Payer: Self-pay | Admitting: Orthopaedic Surgery

## 2022-10-28 VITALS — BP 129/81 | HR 82 | Ht 64.0 in | Wt 229.0 lb

## 2022-10-28 DIAGNOSIS — M79641 Pain in right hand: Secondary | ICD-10-CM | POA: Diagnosis not present

## 2022-10-28 NOTE — Progress Notes (Signed)
Office Visit Note   Patient: Cathy Gonzalez           Date of Birth: Nov 14, 1945           MRN: [id number] Visit Date: 10/28/2022              Requested by: [name], [address] [place],  [place] [address] PCP: [name], MD   Assessment & Plan: Visit Diagnoses:  1. Pain in right hand     Plan: Patient has some flexor tenosynovitis but no triggering.  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  If she starts to have triggering she can return for injection.  Follow-Up Instructions: No follow-ups on file.   Orders:  No orders of the defined types were placed in this encounter.  No orders of the defined types were placed in this encounter.     Procedures: No procedures performed   Clinical Data: No additional findings.   Subjective: Chief Complaint  Patient presents with   Right Arm - Follow-up   Right Knee - Follow-up    HPI patient returns she states some pain with her right ring and long finger with grabbing and squeezing and points to the A1 pulley.  She has not had any locking does not catch at night.  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  She has a compressive glove on the left hand for arthritis she states it helps her left hand.  Sometimes she wears compressive glove on the right hand.  She does not have type 2 diabetes she has been working on some weight loss which is improved her right knee symptoms states she is lost couple pounds since last seen.  Previous injection in her shoulder in November gave her good relief.  Review of Systems previous cervical fusion for spinal stenosis.  Type 2 diabetes   Objective: Vital Signs: BP 129/81   Pulse 82   Ht '5\' 4"'$  (1.626 m)   Wt 229 lb (103.9 kg)   BMI 39.31 kg/m   Physical Exam Constitutional:      Appearance: She is well-developed.  HENT:     Head: Normocephalic.     Right Ear: External ear  normal.     Left Ear: External ear normal. There is no impacted cerumen.  Eyes:     Pupils: Pupils are equal, round, and reactive to light.  Neck:     Thyroid: No thyromegaly.     Trachea: No tracheal deviation.  Cardiovascular:     Rate and Rhythm: Normal rate.  Pulmonary:     Effort: Pulmonary effort is normal.  Abdominal:     Palpations: Abdomen is soft.  Musculoskeletal:     Cervical back: No rigidity.  Skin:    General: Skin is warm and dry.  Neurological:     Mental Status: She is alert and oriented to person, place, and time.  Psychiatric:        Behavior: Behavior normal.     Ortho Exam some tenderness over the A1 pulley long and ring finger right hand.  She can fully flex and extend without triggering no partial catching.  Sensation of the fingertips are intact no atrophy.  Carpal tunnel exam is negative.  Specialty Comments:  No specialty comments available.  Imaging: No results found.   PMFS History: Patient Active Problem List   Diagnosis  Date Noted   Pain in right hand 10/28/2022   Impingement syndrome of right shoulder 08/12/2022   S/P cervical spinal fusion 06/25/2022   Pain due to onychomycosis of toenails of both feet 05/06/2022   Cervical stenosis of spine 02/24/2022   Spinal stenosis of cervical region 01/03/2022   Neck pain 12/03/2021   Abnormal liver function tests 02/12/2021   Diarrhea 02/12/2021   Gastro-esophageal reflux disease without esophagitis 22/11/5425   Periumbilical pain 03/29/7627   Personal history of colonic polyps 02/12/2021   Portal hypertension ([place]) 02/12/2021   Screening for malignant neoplasm of colon 02/12/2021   Type 2 diabetes mellitus without complications ([place]) 31/51/7616   Primary osteoarthritis, left hand 11/07/2020   Arthritis of carpometacarpal (CMC) joint of thumb 07/18/2020   Bilateral primary osteoarthritis of knee 06/20/2020   Bilateral primary osteoarthritis of hip 06/07/2020   Right thigh pain 05/09/2020    Low back pain 07/14/2019   OSA on CPAP 05/16/2019   Diabetic neuropathy associated with type 2 diabetes mellitus ([place]) 12/13/2018   Chronic pain of right knee 12/09/2018   Thumb pain, left 12/09/2018   Dizziness and giddiness 01/14/2016   Hepatic cirrhosis ([place]) 07/26/2015   Nocturnal seizures ([place]) 05/14/2015   Chronic hepatitis C without hepatic coma ([place]) 03/14/2015   Vertigo, central 02/11/2014   Difficulty walking 02/11/2014   Diabetes mellitus ([facility]) 02/11/2014   Dysphagia 02/11/2014   OBESITY 07/26/2010   Essential hypertension, benign 07/26/2010   DEGENERATIVE JOINT DISEASE, GENERALIZED 07/26/2010   Past Medical History:  Diagnosis Date   Arthritis    Cataract    left cataract removed in 2017   Diabetes mellitus    Diabetic neuropathy (HCC)    Diabetic neuropathy, type II diabetes mellitus ([place])    Hepatitis C    took tx for    Hypertension    Hypokalemia    Knee pain    right    Morbidly obese (HCC)    Nocturnal seizures ([place]) 05/14/2015   no bad seizure since 1980's   OSA on CPAP    uses cpap 2-3 week   Plantar fasciitis    both feet    Family History  Problem Relation Age of Onset   Diabetes Mother    Heart attack Mother    Diabetes Sister    Diabetes Brother    Cancer Sister        breast   Breast cancer Sister    Seizures Neg Hx     Past Surgical History:  Procedure Laterality Date   ABDOMINAL HYSTERECTOMY     partial   ANTERIOR CERVICAL DECOMP/DISCECTOMY FUSION N/A 02/24/2022   Procedure: CERVICAL FOUR- CERVICAL FIVE ANTERIOR CERVICAL DECOMPRESSION/DISCECTOMY FUSION ONE LEVEL WITH ALLOGRAFT AND PLATE;  Surgeon: [name], MD;  Location: [facility];  Service: Orthopedics;  Laterality: N/A;   CATARACT EXTRACTION Left    COLONOSCOPY WITH PROPOFOL N/A 08/21/2015   Procedure: COLONOSCOPY WITH PROPOFOL;  Surgeon: Carol Ada, MD;  Location: [facility];  Service: Endoscopy;  Laterality: N/A;   COLONOSCOPY WITH PROPOFOL N/A 08/22/2022   Procedure:  COLONOSCOPY WITH PROPOFOL;  Surgeon: Carol Ada, MD;  [facility]: [facility];  Service: Gastroenterology;  Laterality: N/A;   ESOPHAGOGASTRODUODENOSCOPY (EGD) WITH PROPOFOL N/A 08/21/2015   Procedure: ESOPHAGOGASTRODUODENOSCOPY (EGD) WITH PROPOFOL;  Surgeon: Carol Ada, MD;  [facility];  Service: Endoscopy;  Laterality: N/A;   ESOPHAGOGASTRODUODENOSCOPY (EGD) WITH PROPOFOL N/A 10/08/2018   Procedure: ESOPHAGOGASTRODUODENOSCOPY (EGD) WITH PROPOFOL;  Surgeon: Carol Ada, MD;  [facility];  Service: Endoscopy;  Laterality: N/A;   ESOPHAGOGASTRODUODENOSCOPY (EGD) WITH PROPOFOL N/A 08/22/2022   Procedure: ESOPHAGOGASTRODUODENOSCOPY (EGD) WITH PROPOFOL;  Surgeon: Carol Ada, MD;  [facility];  Service: Gastroenterology;  Laterality: N/A;   EYE SURGERY Bilateral    cataracts   POLYPECTOMY  08/22/2022   Procedure: POLYPECTOMY;  Surgeon: Carol Ada, MD;  Location: [facility];  Service: Gastroenterology;;   TUBAL LIGATION     Social History   Occupational History   Occupation: [name]  Tobacco Use   Smoking status: Never   Smokeless tobacco: Never  Vaping Use   Vaping Use: Never used  Substance and Sexual Activity   Alcohol use: No    Alcohol/week: 0.0 standard drinks of alcohol   Drug use: No   Sexual activity: Never

## 2022-11-04 ENCOUNTER — Ambulatory Visit: Payer: PPO | Admitting: Neurology

## 2022-11-04 DIAGNOSIS — G4733 Obstructive sleep apnea (adult) (pediatric): Secondary | ICD-10-CM

## 2022-11-05 NOTE — Procedures (Signed)
[facility] TEST REPORT ( by Watch PAT)   STUDY DATE: 11-04-2022  DOB: 02/25/1946      ORDERING CLINICIAN: Cristal Generous REFERRING CLINICIAN: Dr. Criss Rosales   CLINICAL INFORMATION/HISTORY: 09/17/22 SS Cathy Gonzalez here today for follow-up due to recurrent seizure. She was using CPAP, after about 3 weeks her face mask doesn't get a new seal, she was not wearing CPAP that night. She woke up the next morning, had bitten her left tongue, she felt tired, head was bloated. 2 weeks prior had started Repatha, that day had sinus issues, took Muccinex that night. Has remained on Keppra 1500 mg twice a day.  In 2016 she was previously treated for nocturnal seizures with carbamazepine.  She was switched to Keppra due to treatment for hepatitis C.  Has had nocturnal seizures since 1970's. Has cirrhosis.  Would like a new CPAP machine, claims falling apart.   Would like MRI of the brain, given chronic gait instability more than she thinks normal for age, fullness in her head (could be sinuses), feels something isn't right. Most seizures triggered by stress, nothing stressful right now. What caused it?  No date of the original CPAP set up was quoted.    Epworth sleepiness score: 14 /24. FSS: X   BMI: 40.3 kg/m   Neck Circumference: 19"   FINDINGS:   Sleep Summary:   Total Recording Time (hours, min): Total recording time was 10 hours 1 minute and total sleep time 9 hours and 3 minutes.  There were 19% REM sleep time.    Total sleep latency was 23 minutes and REM sleep latency was 62 minutes.     Respiratory Indices:   Calculated pAHI (per hour):                             REM pAHI: 52/h                                               NREM pAHI: 34/h                            Positional AHI:   The patient only slept in 1 position, the right lateral position with an AHI of 37.3/h.  Snoring data suggest loud snoring with a mean volume of 53 dB present for  over 90% of total sleep time.                                                Oxygen Saturation Statistics:    O2 Saturation Range (%): Between a nadir at 76 and a maximum saturation of 98% with a mean saturation of 92%.                                     O2 Saturation (minutes) <89%:   40 minutes. O2 saturation in minutes below 90%: 49 minutes, the equivalent of 9% of total sleep time.  There were a total of 220 desaturation events.  Pulse Rate Statistics:            Pulse Range: Between 62 and 97 bpm with a mean heart rate of 57 bpm.  Please note that this does not reflect cardiac rhythm information -only heart rate.              IMPRESSION:  This HST confirms the presence of severe sleep apnea which is even worse during REM sleep.  A positional component could not be elicited.  There is hypoxia with a nadir at 76% oxygen saturation and prolonged periods of oxygen desaturation.  This constellation requires positive airway pressure therapy   RECOMMENDATION: Auto titration CPAP device with heated humidification and interface of patient's choice, preferably ResMed machine with a pressure window between 5 and 15 cm water pressure, and a follow-up visit with a provider within 30 to 90 days of CPAP set up.  Please note that additional weight loss can be beneficial especially for the loud snoring that was documented. Improvement of sleep hygiene with regular bed and regular rise times in the morning is also important regular intake of medication, as a seizure patient she is advised not to take decongestants.    INTERPRETING PHYSICIAN:   Larey Seat, MD   Medical Director of [facility] at [facility].

## 2022-11-05 NOTE — Progress Notes (Signed)
[facility] TEST REPORT ( by Watch PAT)   STUDY DATE: 11-04-2022  DOB: [birth year]      ORDERING CLINICIAN: Cristal Generous REFERRING CLINICIAN: Dr. Criss Rosales   CLINICAL INFORMATION/HISTORY: 09/17/22 SS Cathy Gonzalez here today for follow-up due to recurrent seizure. She was using CPAP, after about 3 weeks her face mask doesn't get a new seal, she was not wearing CPAP that night. She woke up the next morning, had bitten her left tongue, she felt tired, head was bloated. 2 weeks prior had started Repatha, that day had sinus issues, took Muccinex that night. Has remained on Keppra 1500 mg twice a day.  In 2016 she was previously treated for nocturnal seizures with carbamazepine.  She was switched to Keppra due to treatment for hepatitis C.  Has had nocturnal seizures since 1970's. Has cirrhosis.  Would like a new CPAP machine, claims falling apart.   Would like MRI of the brain, given chronic gait instability more than she thinks normal for age, fullness in her head (could be sinuses), feels something isn't right. Most seizures triggered by stress, nothing stressful right now. What caused it?  No date of the original CPAP set up was quoted.    Epworth sleepiness score: 14 /24. FSS: X   BMI: 40.3 kg/m   Neck Circumference: 19"   FINDINGS:   Sleep Summary:   Total Recording Time (hours, min): Total recording time was 10 hours 1 minute and total sleep time 9 hours and 3 minutes.  There were 19% REM sleep time.    Total sleep latency was 23 minutes and REM sleep latency was 62 minutes.     Respiratory Indices:   Calculated pAHI (per hour):                             REM pAHI: 52/h                                               NREM pAHI: 34/h                            Positional AHI:   The patient only slept in 1 position, the right lateral position with an AHI of 37.3/h.  Snoring data suggest loud snoring with a mean volume of 53 dB present for  over 90% of total sleep time.                                                Oxygen Saturation Statistics:    O2 Saturation Range (%): Between a nadir at 76 and a maximum saturation of 98% with a mean saturation of 92%.                                     O2 Saturation (minutes) <89%:   40 minutes. O2 saturation in minutes below 90%: 49 minutes, the equivalent of 9% of total sleep time.  There were a total of 220 desaturation events.  Pulse Rate Statistics:            Pulse Range: Between 62 and 97 bpm with a mean heart rate of 57 bpm.  Please note that this does not reflect cardiac rhythm information -only heart rate.              IMPRESSION:  This HST confirms the presence of severe sleep apnea which is even worse during REM sleep.  A positional component could not be elicited.  There is hypoxia with a nadir at 76% oxygen saturation and prolonged periods of oxygen desaturation.  This constellation requires positive airway pressure therapy   RECOMMENDATION: Auto titration CPAP device with heated humidification and interface of patient's choice, preferably ResMed machine with a pressure window between 5 and 15 cm water pressure, and a follow-up visit with a provider within 30 to 90 days of CPAP set up.  Please note that additional weight loss can be beneficial especially for the loud snoring that was documented. Improvement of sleep hygiene with regular bed and regular rise times in the morning is also important regular intake of medication, as a seizure patient she is advised not to take decongestants.    INTERPRETING PHYSICIAN:   Larey Seat, MD   Medical Director of [facility] at [facility].

## 2022-11-06 ENCOUNTER — Telehealth: Payer: Self-pay | Admitting: Neurology

## 2022-11-06 DIAGNOSIS — G4733 Obstructive sleep apnea (adult) (pediatric): Secondary | ICD-10-CM

## 2022-11-06 NOTE — Telephone Encounter (Signed)
Community message sent to DME for new CPAP

## 2022-11-06 NOTE — Telephone Encounter (Signed)
HST 11/04/22) severe sleep apnea worse during REM sleep.  Hypoxia with a nadir of 70% oxygen saturation and prolonged periods of oxygen desaturation.  Recommend new AutoPap CPAP.  RECOMMENDATION: Auto titration CPAP device with heated humidification and interface of patient's choice, preferably ResMed machine with a pressure window between 5 and 15 cm water pressure, and a follow-up visit with a provider within 30 to 90 days of CPAP set up.   REM pAHI: 52/h                                               NREM pAHI: 34/h      Patient is a current CPAP user. Order placed for new CPAP machine. Please let the patient know.   Orders Placed This Encounter  Procedures   For home use only DME continuous positive airway pressure (CPAP)

## 2022-11-07 DIAGNOSIS — E785 Hyperlipidemia, unspecified: Secondary | ICD-10-CM | POA: Diagnosis not present

## 2022-11-07 DIAGNOSIS — G40001 Localization-related (focal) (partial) idiopathic epilepsy and epileptic syndromes with seizures of localized onset, not intractable, with status epilepticus: Secondary | ICD-10-CM | POA: Diagnosis not present

## 2022-11-07 DIAGNOSIS — E1169 Type 2 diabetes mellitus with other specified complication: Secondary | ICD-10-CM | POA: Diagnosis not present

## 2022-11-07 DIAGNOSIS — B0089 Other herpesviral infection: Secondary | ICD-10-CM | POA: Diagnosis not present

## 2022-11-07 DIAGNOSIS — Z6838 Body mass index (BMI) 38.0-38.9, adult: Secondary | ICD-10-CM | POA: Diagnosis not present

## 2022-11-07 DIAGNOSIS — I10 Essential (primary) hypertension: Secondary | ICD-10-CM | POA: Diagnosis not present

## 2022-11-25 ENCOUNTER — Telehealth: Payer: Self-pay | Admitting: Neurology

## 2022-11-25 NOTE — Telephone Encounter (Signed)
Pt has a machine she was set up with 03/29/2019. She is not eligible for a new machine. However the patient can continue using the current machine at that settings that are ordered.  Here is most recent data from the current machine that was set up in June 2020.  Pt is doing well and apnea is being well managed. I have adjusted the maximum pressure setting to the ordered setting of 15 cm water pressure instead of 16 cm water pressure. If pt has issues with the machine she would need to bring that to the DME companies attention.  Per insurance she will be eligible 03/2024 for a new machine.

## 2022-11-25 NOTE — Telephone Encounter (Signed)
Cathy Gonzalez from [facility] called wanting to inform the provider that her insurance denied a new CPAP machine due to her last one being provided on June 2020.

## 2022-11-28 DIAGNOSIS — I10 Essential (primary) hypertension: Secondary | ICD-10-CM | POA: Diagnosis not present

## 2022-11-28 DIAGNOSIS — G4730 Sleep apnea, unspecified: Secondary | ICD-10-CM | POA: Diagnosis not present

## 2022-11-30 DIAGNOSIS — G40909 Epilepsy, unspecified, not intractable, without status epilepticus: Secondary | ICD-10-CM | POA: Diagnosis not present

## 2022-12-01 DIAGNOSIS — G40909 Epilepsy, unspecified, not intractable, without status epilepticus: Secondary | ICD-10-CM | POA: Diagnosis not present

## 2022-12-02 DIAGNOSIS — G40909 Epilepsy, unspecified, not intractable, without status epilepticus: Secondary | ICD-10-CM | POA: Diagnosis not present

## 2022-12-04 DIAGNOSIS — E785 Hyperlipidemia, unspecified: Secondary | ICD-10-CM | POA: Diagnosis not present

## 2022-12-04 DIAGNOSIS — E118 Type 2 diabetes mellitus with unspecified complications: Secondary | ICD-10-CM | POA: Diagnosis not present

## 2022-12-04 DIAGNOSIS — I10 Essential (primary) hypertension: Secondary | ICD-10-CM | POA: Diagnosis not present

## 2022-12-08 ENCOUNTER — Ambulatory Visit: Payer: PPO | Admitting: Podiatry

## 2022-12-15 ENCOUNTER — Encounter: Payer: Self-pay | Admitting: Neurology

## 2022-12-16 ENCOUNTER — Other Ambulatory Visit: Payer: Self-pay | Admitting: Cardiovascular Disease

## 2022-12-16 NOTE — Telephone Encounter (Signed)
Amb EEG was done on 2/24. I will complete by end of week. Thanks

## 2022-12-17 ENCOUNTER — Other Ambulatory Visit: Payer: Self-pay | Admitting: Neurology

## 2022-12-17 ENCOUNTER — Encounter: Payer: Self-pay | Admitting: Neurology

## 2022-12-17 DIAGNOSIS — R569 Unspecified convulsions: Secondary | ICD-10-CM

## 2022-12-17 NOTE — Procedures (Signed)
   CLINICAL HISTORY: [name] who presents with follow up for recurrent seizure. After 3 weeks her CPAP stopped fitting properly and does not get a good seal. She was not wearing her CPAP that night and woke up the next morning and had bitten the left side of her tongue, felt tired, head was bloated. She had started Repatha two weeks previous. EEG to evaluate for seizure activity.  MEDICATIONS: Norvasc, Lotrimin, Colchicine, Voltaren, Jardiance, Zetia, Lasix, Fiasp, Keppra, Glucophage, Robaxin, Klorcon, Lyrica, Carafate, Micardis  TECHNICAL SUMMARY: Electrodes are applied using the standard 10-20 international placement protocol. This ambulatory EEG recording was performed utilizing 23 electrode inputs including: (19) cephalic, (1) ground, (1) system reference, and (2) EKG. The EEG recording can be visualized in all standard types of montages for review. The recordings are done at a sampling rate of 200 samples per second, per channel, allowing for relatively high frequency responses of 70 Hz. EEG playbacks are done with digital high frequency filters noted at the top of the page. The EEG is notated with patient events at the direction of the patient by pressing a push button mounted on a waist worn EEG recording device. This EEG was also recorded using high definition digital video.  TECHNOLOGIST NOTE: No skull defect or scalp edemas were present.  BACKGROUND: The record was obtained in the awake and drowsy state. No sleep was able to be captured. The posterior awake dominant background activity was a continuous, reactive rhythm of 8.5-9 Hz. This was symmetric and well-modulated and attenuated with eye opening. Symmetric diffuse frontocentral beta range activity was present.  SIGNIFICANT FINDINGS: There were no apparent asymmetries, focal or generalized abnormalities noted. There were no apparent electrographic or clinical seizures noted by the reviewing neurodiagnostic  technologist.  PATIENT EVENTS: There were no patient events noted or captured during this recording.  HYPERVENTILATION: Hyperventilation was not performed due to COVID-19 contraindications.  PHOTIC STIMULATION: IPS Was not performed.  EKG: There were no arrhythmias or abnormalities noted during this recording.  Impression: Normal routine EEGs, however does not rule out epilepsy.   Alric Ran, MD [facility]

## 2022-12-17 NOTE — Procedures (Signed)
Clinical History : [name] who presents with follow up for recurrent seizure. After 3 weeks her CPAP stopped fitting properly and does not get a good seal. She was not wearing her CPAP that night and woke up the next morning and had bitten the left side of her tongue, felt tired, head was bloated. She had started Repatha two weeks previous. EEG to evaluate for seizure activity.  INTERMITTENT MONITORING with VIDEO TECHNICAL SUMMARY: This AVEEG was performed using equipment provided by [employer] utilizing Bluetooth ( [employer] ) amplifiers with continuous EEGT attended video collection using encrypted remote transmission via [place] secured cellular tower network with data rates for each AVEEG performed. This is a Biomedical engineer AVEEG, obtained, according to the 10-20 international electrode placement system, reformatted digitally into referential and bipolar montages. Data was acquired with a minimum of 21 bipolar connections and sampled at a minimum rate of 250 cycles per second per channel, maximum rate of 450 cycles per second per channel and two channels for EKG. The entire VEEG study was recorded through cable and or radio telemetry for subsequent analysis. Specified epochs of the AVEEG data were identified at the direction of the subject by the depression of a push button by the patient. Each patients event file included data acquired two minutes prior to the push button activation and continuing until two minutes afterwards. AVEEG files were reviewed on [place], Licensed Software provided by [employer] with a digital high frequency filter set at 70 Hz and a low frequency filter set at 1 Hz with a paper speed of 19m/s resulting in 10 seconds per digital page. This entire AVEEG was reviewed by the EEG Technologist. Random time samples, random sleep samples, clips, patient initiated push button files with included patient daily diary logs, EEG Technologist  pruned data was reviewed and verified for accuracy and validity by the governing reading neurologist in full details. This AEEGV was fully compliant with all requirements for CPT 97500 for setup, patient education, take down and administered by an EEG technologist. Long-Term EEG with Video was monitored intermittently by a qualified EEG technologist for the entirety of the recording; quality check-ins were performed at a minimum of every two hours, checking and documenting real-time data and video to assure the integrity and quality of the recording (e.g., camera position, electrode integrity and impedance), and identify the need for maintenance. For intermittent monitoring, an EEG Technologist monitored no more than 12 patients concurrently. Diagnostic video was captured at least 80% of the time during the recording.  PATIENT EVENTS: There were no patient events noted or captured during this recording.  TECHNOLOGIST EVENTS: No clear epileptiform activity was detected by the reviewing neurodiagnostic technologist during the recording for further evaluation.  TIME SAMPLES: 10-minutes of every 2 hours recorded are reviewed as random time samples.  SLEEP SAMPLES: 5-minutes of every 24 hours recorded are reviewed as random sleep samples.  AWAKE: At maximal level of alertness, the posterior dominant background activity was continuous, reactive, low voltage rhythm of 8.5 to 9 Hz. This was symmetric, well-modulated, and attenuated with eye opening. Diffuse, symmetric, frontocentral beta range activity was present.  SLEEP: N1 Sleep (Stage 1) was observed and characterized by the disappearance of alpha rhythm and the appearance of vertex activity. N2 Sleep (Stage 2) was observed and characterized by vertex waves, K-complexes, and sleep spindles. N3 (Stage 3) sleep was observed and characterized by high amplitude Delta activity of 20%. REM sleep was observed.  EKG: There were no arrhythmias or  abnormalities noted during this recording.  Impression: This is a normal 48 hours ambulatory video EEG recording. There were no events, epileptiform discharges or seizures captured. Normal EEGs, however does not rule out epilepsy.  Alric Ran, MD [facility]

## 2022-12-22 DIAGNOSIS — G40001 Localization-related (focal) (partial) idiopathic epilepsy and epileptic syndromes with seizures of localized onset, not intractable, with status epilepticus: Secondary | ICD-10-CM | POA: Diagnosis not present

## 2022-12-22 DIAGNOSIS — G629 Polyneuropathy, unspecified: Secondary | ICD-10-CM | POA: Diagnosis not present

## 2022-12-22 DIAGNOSIS — I10 Essential (primary) hypertension: Secondary | ICD-10-CM | POA: Diagnosis not present

## 2022-12-30 ENCOUNTER — Other Ambulatory Visit (HOSPITAL_COMMUNITY): Payer: Self-pay

## 2022-12-30 ENCOUNTER — Encounter (HOSPITAL_COMMUNITY): Payer: Self-pay

## 2022-12-30 ENCOUNTER — Other Ambulatory Visit: Payer: Self-pay

## 2022-12-30 ENCOUNTER — Emergency Department (HOSPITAL_COMMUNITY)
Admission: EM | Admit: 2022-12-30 | Discharge: 2022-12-30 | Disposition: A | Discharge status: Home / Self Care | Payer: PPO | Attending: Emergency Medicine | Admitting: Emergency Medicine

## 2022-12-30 DIAGNOSIS — Z7984 Long term (current) use of oral hypoglycemic drugs: Secondary | ICD-10-CM | POA: Insufficient documentation

## 2022-12-30 DIAGNOSIS — M791 Myalgia, unspecified site: Secondary | ICD-10-CM | POA: Diagnosis not present

## 2022-12-30 DIAGNOSIS — E119 Type 2 diabetes mellitus without complications: Secondary | ICD-10-CM | POA: Diagnosis not present

## 2022-12-30 DIAGNOSIS — Z794 Long term (current) use of insulin: Secondary | ICD-10-CM | POA: Insufficient documentation

## 2022-12-30 DIAGNOSIS — I10 Essential (primary) hypertension: Secondary | ICD-10-CM | POA: Insufficient documentation

## 2022-12-30 DIAGNOSIS — R600 Localized edema: Secondary | ICD-10-CM | POA: Diagnosis not present

## 2022-12-30 DIAGNOSIS — R112 Nausea with vomiting, unspecified: Secondary | ICD-10-CM

## 2022-12-30 DIAGNOSIS — E876 Hypokalemia: Secondary | ICD-10-CM | POA: Insufficient documentation

## 2022-12-30 DIAGNOSIS — Z79899 Other long term (current) drug therapy: Secondary | ICD-10-CM | POA: Diagnosis not present

## 2022-12-30 LAB — COMPREHENSIVE METABOLIC PANEL
ALT: 55 U/L — ABNORMAL HIGH (ref 0–44)
AST: 46 U/L — ABNORMAL HIGH (ref 15–41)
Albumin: 5 g/dL (ref 3.5–5.0)
Alkaline Phosphatase: 72 U/L (ref 38–126)
Anion gap: 13 (ref 5–15)
BUN: 18 mg/dL (ref 8–23)
CO2: 26 mmol/L (ref 22–32)
Calcium: 9.8 mg/dL (ref 8.9–10.3)
Chloride: 101 mmol/L (ref 98–111)
Creatinine, Ser: 0.96 mg/dL (ref 0.44–1.00)
GFR, Estimated: >60 mL/min (ref >60)
Glucose, Bld: 108 mg/dL — ABNORMAL HIGH (ref 70–99)
Potassium: 3.1 mmol/L — ABNORMAL LOW (ref 3.5–5.1)
Sodium: 140 mmol/L (ref 135–145)
Total Bilirubin: 0.6 mg/dL (ref 0.3–1.2)
Total Protein: 8.5 g/dL — ABNORMAL HIGH (ref 6.5–8.1)

## 2022-12-30 LAB — URINALYSIS, ROUTINE W REFLEX MICROSCOPIC
Bilirubin Urine: NEGATIVE
Glucose, UA: >=500 mg/dL — AB
Hgb urine dipstick: NEGATIVE
Ketones, ur: 20 mg/dL — AB
Leukocytes,Ua: NEGATIVE
Nitrite: NEGATIVE
Protein, ur: 30 mg/dL — AB
Specific Gravity, Urine: 1.024 (ref 1.005–1.030)
pH: 7 (ref 5.0–8.0)

## 2022-12-30 LAB — CBC WITH DIFFERENTIAL/PLATELET
Abs Immature Granulocytes: 0.04 10*3/uL (ref 0.00–0.07)
Basophils Absolute: 0.1 10*3/uL (ref 0.0–0.1)
Basophils Relative: 1 %
Eosinophils Absolute: 0.1 10*3/uL (ref 0.0–0.5)
Eosinophils Relative: 1 %
HCT: 44.7 % (ref 36.0–46.0)
Hemoglobin: 14.7 g/dL (ref 12.0–15.0)
Immature Granulocytes: 1 %
Lymphocytes Relative: 31 %
Lymphs Abs: 2 10*3/uL (ref 0.7–4.0)
MCH: 29.7 pg (ref 26.0–34.0)
MCHC: 32.9 g/dL (ref 30.0–36.0)
MCV: 90.3 fL (ref 80.0–100.0)
Monocytes Absolute: 0.7 10*3/uL (ref 0.1–1.0)
Monocytes Relative: 11 %
Neutro Abs: 3.5 10*3/uL (ref 1.7–7.7)
Neutrophils Relative %: 55 %
Platelets: 303 10*3/uL (ref 150–400)
RBC: 4.95 MIL/uL (ref 3.87–5.11)
RDW: 13.2 % (ref 11.5–15.5)
WBC: 6.3 10*3/uL (ref 4.0–10.5)
nRBC: 0 % (ref 0.0–0.2)

## 2022-12-30 LAB — TROPONIN I (HIGH SENSITIVITY): Troponin I (High Sensitivity): 9 ng/L (ref <18)

## 2022-12-30 LAB — LIPASE, BLOOD: Lipase: 28 U/L (ref 11–51)

## 2022-12-30 MED ORDER — PREGABALIN 50 MG PO CAPS
300.0000 mg | ORAL_CAPSULE | Freq: Once | ORAL | Status: AC
  Administered 2022-12-30: 300 mg via ORAL
  Filled 2022-12-30: qty 6

## 2022-12-30 MED ORDER — ONDANSETRON HCL 4 MG/2ML IJ SOLN
4.0000 mg | Freq: Once | INTRAMUSCULAR | Status: AC
  Administered 2022-12-30: 4 mg via INTRAVENOUS
  Filled 2022-12-30: qty 2

## 2022-12-30 MED ORDER — PREGABALIN 300 MG PO CAPS
300.0000 mg | ORAL_CAPSULE | Freq: Two times a day (BID) | ORAL | 0 refills | Status: DC
  Filled 2022-12-30: qty 60, 30d supply, fill #0

## 2022-12-30 MED ORDER — LACTATED RINGERS IV BOLUS
1000.0000 mL | Freq: Once | INTRAVENOUS | Status: AC
  Administered 2022-12-30: 1000 mL via INTRAVENOUS

## 2022-12-30 NOTE — ED Triage Notes (Signed)
C/o burning sensation to feet, weakness, nausea, generalized abd pain, and, urinary frequency.  Pt reports all started when stopped taking lyrica.

## 2022-12-30 NOTE — Discharge Instructions (Addendum)
The blood work today looks good except.  A little bit dehydrated.  I feel like most of the symptoms you are having today is because you did not have your Lyrica.  A prescription was sent to the [facility] who reported they did have it.  You have had a dose here so you do not need to take another dose till this evening.  If you start having fever, worsening abdominal pain, persistent vomiting return to the ER

## 2022-12-30 NOTE — ED Provider Notes (Addendum)
[facility] AT [facility] Provider Note   CSN: [id number] Arrival date & time: 12/30/22  [id number]     History  Chief Complaint  Patient presents with   Abdominal Pain   Urinary Frequency    Cathy Gonzalez is a 77 y.o. female.  Patient is a 77 year old female with a history of hypertension, hypokalemia, diabetes with neuropathy, nocturnal seizures who is presenting today with multiple complaints.  Patient reports her symptoms have been worsening over the last 2 weeks.  She is having urinary frequency, urgency, intermittent abdominal cramping, nausea with intermittent vomiting, pain all over her body specifically severe burning pain in her legs and feet.  Patient reports that she ran out of her Lyrica 2 weeks ago and despite having a prescription none of the pharmacies have the medication available.  She has been calling multiple places to see if another pharmacy has the medication and they do not.  She has been working with her doctor for this.  She is otherwise been compliant with her other medications.  She has not had cough or fever.  She has not had any shortness of breath.  She reports at night she is getting up hourly to use the bathroom.  Last blood sugar this morning was 108.  She also reports there has been swelling in her legs but that has been intermittent for quite some time.  The history is provided by the patient and medical records.  Abdominal Pain Urinary Frequency Associated symptoms include abdominal pain.       Home Medications Prior to Admission medications   Medication Sig Start Date End Date Taking? Authorizing Provider  pregabalin (LYRICA) 300 MG capsule Take 1 capsule (300 mg total) by mouth 2 (two) times daily. 12/30/22  Yes Marquarius Lofton, Loree Fee, MD  amLODipine (NORVASC) 10 MG tablet Take 5 mg by mouth daily.    [provider]  Blood Glucose Monitoring Suppl (ONE TOUCH ULTRA 2) w/Device KIT 2 (two) times daily. as directed  01/24/19   [provider]  clotrimazole (LOTRIMIN) 1 % cream Apply 1 Application topically 2 (two) times daily. 08/19/22   [provider]  colchicine 0.6 MG tablet Take 0.6 mg by mouth at bedtime.    [provider]  diclofenac Sodium (VOLTAREN) 1 % GEL Apply 2 g topically in the morning and at bedtime. 08/17/19   [provider]  empagliflozin (JARDIANCE) 10 MG TABS tablet Take 1 tablet (10 mg total) by mouth daily. 12/16/22   O'NealCassie Freer, MD  ezetimibe (ZETIA) 10 MG tablet Take 10 mg by mouth at bedtime.  02/16/18   [provider]  furosemide (LASIX) 40 MG tablet Take 40 mg by mouth at bedtime. 12/03/16   [provider]  Insulin Aspart, w/Niacinamide, (FIASP) 100 UNIT/ML SOLN Inject 3-5 Units into the skin daily as needed (Blood sugar <150).    [provider]  levETIRAcetam (KEPPRA) 1000 MG tablet Take 1.5 tablets in the morning, take 2 tablets at bedtime 09/17/22   Suzzanne Cloud, NP  metFORMIN (GLUCOPHAGE) 500 MG tablet Take 500 mg by mouth at bedtime.     [provider]  methocarbamol (ROBAXIN) 500 MG tablet Take 1 tablet (500 mg total) by mouth every 6 (six) hours as needed for muscle spasms. 05/05/22   [name], MD  montelukast (SINGULAIR) 10 MG tablet Take 10 mg by mouth at bedtime.    [provider]  ONE TOUCH ULTRA TEST test strip  02/06/18   [provider]  ONETOUCH DELICA LANCETS [id number] MISC  02/06/18   [provider]  potassium chloride SA (KLOR-CON M) 20 MEQ tablet Take 20 mEq by mouth daily. 07/04/22   [provider]  pregabalin (LYRICA) 300 MG capsule Take 1 capsule (300 mg total) by mouth 2 (two) times daily. 08/26/17   Hyatt, Max T, DPM  REPATHA SURECLICK [id number] MG/ML SOAJ Inject 140 mg as directed as directed. Every 2 weeks 08/21/22   [provider]  sucralfate (CARAFATE) 1 G tablet Take 1 g by mouth in the morning, at noon, and at bedtime.    [provider]  telmisartan (MICARDIS) 80 MG tablet Take 80 mg by mouth daily.  01/21/18   [provider]      Allergies    Acetaminophen    Review of Systems   Review of Systems  Gastrointestinal:  Positive for abdominal pain.  Genitourinary:  Positive for frequency.    Physical Exam Updated Vital Signs BP (!) 172/91   Pulse 73   Temp 98 F (36.7 C) (Oral)   Resp 19   Wt 103 kg   SpO2 96%   BMI 38.98 kg/m  Physical Exam Vitals and nursing note reviewed.  Constitutional:      General: She is not in acute distress.    Appearance: She is well-developed.  HENT:     Head: Normocephalic and atraumatic.  Eyes:     Pupils: Pupils are equal, round, and reactive to light.  Neck:   Cardiovascular:     Rate and Rhythm: Normal rate and regular rhythm.     Heart sounds: Normal heart sounds. No murmur heard.    No friction rub.  Pulmonary:     Effort: Pulmonary effort is normal.     Breath sounds: Normal breath sounds. No wheezing or rales.  Abdominal:     General: Bowel sounds are normal. There is no distension.     Palpations: Abdomen is soft.     Tenderness: There is no abdominal tenderness. There is no right CVA tenderness, left CVA tenderness, guarding or rebound.  Musculoskeletal:        General: No tenderness. Normal range of motion.     Cervical back: Normal range of motion and neck supple. Muscular tenderness present. No spinous process tenderness.     Right lower leg: Edema present.     Left lower leg: Edema present.     Comments: Nonpitting edema in bilateral lower extremities  Skin:    General: Skin is warm and dry.     Findings: No rash.  Neurological:     Mental Status: She is alert and oriented to person, place, and time. Mental status is at baseline.     Cranial Nerves: No cranial nerve deficit.  Psychiatric:        Mood and Affect: Mood normal.        Behavior: Behavior normal.     ED Results / Procedures / Treatments   Labs (all labs  ordered are listed, but only abnormal results are displayed) Labs Reviewed  COMPREHENSIVE METABOLIC PANEL - Abnormal; Notable for the following components:      Result Value   Potassium 3.1 (*)    Glucose, Bld 108 (*)    Total Protein 8.5 (*)    AST 46 (*)    ALT 55 (*)    All other components within normal limits  URINALYSIS, ROUTINE W REFLEX MICROSCOPIC - Abnormal; Notable for  the following components:   Glucose, UA >=500 (*)    Ketones, ur 20 (*)    Protein, ur 30 (*)    Bacteria, UA RARE (*)    All other components within normal limits  CBC WITH DIFFERENTIAL/PLATELET  LIPASE, BLOOD  TROPONIN I (HIGH SENSITIVITY)    EKG EKG Interpretation  Date/Time:  Tuesday December 30 2022 09:43:55 EDT Ventricular Rate:  74 PR Interval:  155 QRS Duration: 94 QT Interval:  414 QTC Calculation: 460 R Axis:   -38 Text Interpretation: Sinus rhythm Left axis deviation Low voltage, precordial leads Consider anterior infarct No significant change since last tracing Confirmed by Blanchie Dessert [phone]) on 12/30/2022 9:45:34 AM  Radiology No results found.  Procedures Procedures    Medications Ordered in ED Medications  ondansetron (ZOFRAN) injection 4 mg (4 mg Intravenous Given 12/30/22 1043)  lactated ringers bolus 1,000 mL (1,000 mLs Intravenous New Bag/Given 12/30/22 1034)  pregabalin (LYRICA) capsule 300 mg (300 mg Oral Given 12/30/22 1044)    ED Course/ Medical Decision Making/ A&P                             Medical Decision Making Amount and/or Complexity of Data Reviewed External Data Reviewed: notes. Labs: ordered. Decision-making details documented in ED Course. ECG/medicine tests: ordered and independent interpretation performed. Decision-making details documented in ED Course.  Risk Prescription drug management.   Pt with multiple medical problems and comorbidities and presenting today with a complaint that caries a high risk for morbidity and mortality.  Here today  with multiple symptoms.  Symptoms are concerning for possible withdrawal effects of being off her Lyrica for the last 2 weeks versus electrolyte abnormality, AKI, UTI.  Patient has no focal abdominal tenderness today worrisome for diverticulitis, appendicitis, cholecystitis or pancreatitis.  She does have some mild nonpitting edema in bilateral lower extremities but by review of outside medical records this has been ongoing for quite some time.  A few years ago she had an echo that showed normal EF without acute findings.  I independently interpreted patient's EKG today that shows no acute changes.  She is hypertensive here but otherwise vital signs are reassuring.  Patient given a dose of Lyrica, antiemetics and fluids.  Will talk with our pharmacist to see if the outpatient pharmacy here potentially has her normal dose of Lyrica.  12:21 PM I independently interpreted patient's labs ABC today within normal limits, CMP with hypokalemia of 3.1 and minimal LFT elevation which has been chronic for the patient.  Troponin within normal limits, lipase within lower limits, UA without acute findings.  No findings concerning for infection at this time.  Spoke with the [facility] and they do have her dose of Lyrica available and a prescription was sent there.  After fluids and antiemetics and a dose of Lyrica patient is feeling much better.  She is hungry and wanting to eat.        Final Clinical Impression(s) / ED Diagnoses Final diagnoses:  Myalgia  Nausea and vomiting, unspecified vomiting type    Rx / DC Orders ED Discharge Orders          Ordered    pregabalin (LYRICA) 300 MG capsule  2 times daily        12/30/22 1217              Blanchie Dessert, MD 12/30/22 1221    Blanchie Dessert, MD 12/30/22 1221

## 2022-12-31 ENCOUNTER — Other Ambulatory Visit (HOSPITAL_COMMUNITY): Payer: Self-pay

## 2023-01-04 DIAGNOSIS — I10 Essential (primary) hypertension: Secondary | ICD-10-CM | POA: Diagnosis not present

## 2023-01-04 DIAGNOSIS — E118 Type 2 diabetes mellitus with unspecified complications: Secondary | ICD-10-CM | POA: Diagnosis not present

## 2023-01-04 DIAGNOSIS — E785 Hyperlipidemia, unspecified: Secondary | ICD-10-CM | POA: Diagnosis not present

## 2023-01-05 ENCOUNTER — Encounter: Payer: Self-pay | Admitting: Podiatry

## 2023-01-05 ENCOUNTER — Ambulatory Visit (INDEPENDENT_AMBULATORY_CARE_PROVIDER_SITE_OTHER): Payer: PPO | Admitting: Podiatry

## 2023-01-05 DIAGNOSIS — M79674 Pain in right toe(s): Secondary | ICD-10-CM

## 2023-01-05 DIAGNOSIS — E1149 Type 2 diabetes mellitus with other diabetic neurological complication: Secondary | ICD-10-CM | POA: Diagnosis not present

## 2023-01-05 DIAGNOSIS — M79675 Pain in left toe(s): Secondary | ICD-10-CM | POA: Diagnosis not present

## 2023-01-05 DIAGNOSIS — B351 Tinea unguium: Secondary | ICD-10-CM

## 2023-01-05 DIAGNOSIS — E1140 Type 2 diabetes mellitus with diabetic neuropathy, unspecified: Secondary | ICD-10-CM

## 2023-01-05 NOTE — Progress Notes (Signed)
This patient returns to my office for at risk foot care.  This patient requires this care by a professional since this patient will be at risk due to having type 2 diabetes. Patient has neuropathy.  This patient is unable to cut nails herself since the patient cannot reach her nails.These nails are painful walking and wearing shoes.  This patient presents for at risk foot care today.  General Appearance  Alert, conversant and in no acute stress.  Vascular  Dorsalis pedis and posterior tibial  pulses are palpable  bilaterally.  Capillary return is within normal limits  bilaterally. Temperature is within normal limits  bilaterally.  Neurologic  Senn-Weinstein monofilament wire test within normal limits  bilaterally. Muscle power within normal limits bilaterally.  Nails Thick disfigured discolored nails with subungual debris  from second  to fifth toes bilaterally. No evidence of bacterial infection or drainage bilaterally.  Orthopedic  No limitations of motion  feet .  No crepitus or effusions noted.  No bony pathology or digital deformities noted.  HAV  B/L.  Mild swelling feet  B/L.  Skin  normotropic skin with no porokeratosis noted bilaterally.  No signs of infections or ulcers noted.     Onychomycosis  Pain in right toes  Pain in left toes  Consent was obtained for treatment procedures.   Mechanical debridement of nails 2-5  bilaterally performed with a nail nipper.  Filed with dremel without incident.    Return office visit    10 weeks                  Told patient to return for periodic foot care and evaluation due to potential at risk complications.   Gardiner Barefoot DPM

## 2023-01-12 ENCOUNTER — Other Ambulatory Visit (HOSPITAL_COMMUNITY): Payer: Self-pay

## 2023-01-12 ENCOUNTER — Ambulatory Visit: Payer: HMO | Admitting: Physical Medicine and Rehabilitation

## 2023-01-12 DIAGNOSIS — E0840 Diabetes mellitus due to underlying condition with diabetic neuropathy, unspecified: Secondary | ICD-10-CM | POA: Diagnosis not present

## 2023-01-12 DIAGNOSIS — E876 Hypokalemia: Secondary | ICD-10-CM | POA: Diagnosis not present

## 2023-01-12 DIAGNOSIS — G40001 Localization-related (focal) (partial) idiopathic epilepsy and epileptic syndromes with seizures of localized onset, not intractable, with status epilepticus: Secondary | ICD-10-CM | POA: Diagnosis not present

## 2023-01-12 DIAGNOSIS — I10 Essential (primary) hypertension: Secondary | ICD-10-CM | POA: Diagnosis not present

## 2023-01-12 DIAGNOSIS — E1169 Type 2 diabetes mellitus with other specified complication: Secondary | ICD-10-CM | POA: Diagnosis not present

## 2023-01-12 MED ORDER — PREGABALIN 300 MG PO CAPS
300.0000 mg | ORAL_CAPSULE | Freq: Two times a day (BID) | ORAL | 1 refills | Status: DC
  Filled 2023-01-12 – 2023-01-28 (×2): qty 180, 90d supply, fill #0
  Filled 2023-04-14 – 2023-04-28 (×3): qty 180, 90d supply, fill #1

## 2023-01-15 ENCOUNTER — Other Ambulatory Visit: Payer: HMO

## 2023-01-19 ENCOUNTER — Other Ambulatory Visit (HOSPITAL_COMMUNITY): Payer: Self-pay

## 2023-01-19 ENCOUNTER — Other Ambulatory Visit: Payer: HMO

## 2023-01-19 ENCOUNTER — Ambulatory Visit
Admission: RE | Admit: 2023-01-19 | Discharge: 2023-01-19 | Disposition: A | Discharge status: Home / Self Care | Payer: PPO | Source: Ambulatory Visit | Attending: Internal Medicine | Admitting: Internal Medicine

## 2023-01-19 DIAGNOSIS — K7460 Unspecified cirrhosis of liver: Secondary | ICD-10-CM | POA: Diagnosis not present

## 2023-01-27 ENCOUNTER — Telehealth: Payer: Self-pay | Admitting: Neurology

## 2023-01-27 NOTE — Telephone Encounter (Signed)
Changed appt time due to MD being out of office.

## 2023-01-28 ENCOUNTER — Other Ambulatory Visit (HOSPITAL_COMMUNITY): Payer: Self-pay

## 2023-01-30 ENCOUNTER — Other Ambulatory Visit (HOSPITAL_COMMUNITY): Payer: Self-pay

## 2023-01-31 ENCOUNTER — Other Ambulatory Visit (HOSPITAL_COMMUNITY): Payer: Self-pay

## 2023-01-31 DIAGNOSIS — G4733 Obstructive sleep apnea (adult) (pediatric): Secondary | ICD-10-CM | POA: Diagnosis not present

## 2023-01-31 MED ORDER — GLUCOSE BLOOD VI STRP
ORAL_STRIP | 6 refills | Status: DC
  Filled 2023-01-31 – 2023-02-13 (×3): qty 100, 25d supply, fill #0
  Filled 2023-03-12: qty 100, 25d supply, fill #1
  Filled 2023-04-08: qty 100, 25d supply, fill #2

## 2023-01-31 MED ORDER — METFORMIN HCL 500 MG PO TABS
500.0000 mg | ORAL_TABLET | Freq: Every evening | ORAL | 0 refills | Status: DC
  Filled 2023-02-10 – 2023-03-19 (×9): qty 90, 90d supply, fill #0

## 2023-01-31 MED ORDER — POTASSIUM CHLORIDE ER 20 MEQ PO TBCR
20.0000 meq | EXTENDED_RELEASE_TABLET | Freq: Every day | ORAL | 0 refills | Status: DC
  Filled 2023-01-31 – 2023-03-03 (×3): qty 90, 90d supply, fill #0

## 2023-01-31 MED ORDER — FUROSEMIDE 40 MG PO TABS
40.0000 mg | ORAL_TABLET | Freq: Every day | ORAL | 1 refills | Status: DC
  Filled 2023-01-31 – 2023-02-13 (×3): qty 90, 90d supply, fill #0

## 2023-01-31 MED ORDER — POTASSIUM CHLORIDE CRYS ER 20 MEQ PO TBCR
20.0000 meq | EXTENDED_RELEASE_TABLET | Freq: Every day | ORAL | 0 refills | Status: DC
  Filled 2023-01-31 – 2023-03-05 (×7): qty 90, 90d supply, fill #0

## 2023-01-31 MED ORDER — REPATHA SURECLICK 140 MG/ML ~~LOC~~ SOAJ
140.0000 mg | SUBCUTANEOUS | 1 refills | Status: DC
  Filled 2023-01-31 – 2023-02-13 (×3): qty 6, 84d supply, fill #0
  Filled 2023-02-13 – 2023-02-18 (×2): qty 2, 28d supply, fill #0

## 2023-02-02 ENCOUNTER — Other Ambulatory Visit (HOSPITAL_COMMUNITY): Payer: Self-pay

## 2023-02-07 ENCOUNTER — Other Ambulatory Visit (HOSPITAL_COMMUNITY): Payer: Self-pay

## 2023-02-10 ENCOUNTER — Other Ambulatory Visit (HOSPITAL_COMMUNITY): Payer: Self-pay

## 2023-02-13 ENCOUNTER — Other Ambulatory Visit (HOSPITAL_COMMUNITY): Payer: Self-pay

## 2023-02-13 ENCOUNTER — Other Ambulatory Visit: Payer: Self-pay | Admitting: Cardiovascular Disease

## 2023-02-13 ENCOUNTER — Other Ambulatory Visit: Payer: Self-pay

## 2023-02-13 MED ORDER — EMPAGLIFLOZIN 10 MG PO TABS
10.0000 mg | ORAL_TABLET | Freq: Every day | ORAL | 1 refills | Status: DC
  Filled 2023-03-10: qty 90, 90d supply, fill #0
  Filled 2023-03-12 (×2): qty 30, 30d supply, fill #0
  Filled 2023-04-14: qty 30, 30d supply, fill #1
  Filled 2023-05-16: qty 30, 30d supply, fill #2
  Filled 2023-06-15: qty 30, 30d supply, fill #3
  Filled 2023-08-15 – 2023-09-17 (×2): qty 30, 30d supply, fill #4

## 2023-02-16 ENCOUNTER — Other Ambulatory Visit: Payer: Self-pay

## 2023-02-17 ENCOUNTER — Ambulatory Visit: Payer: PPO | Admitting: Orthopaedic Surgery

## 2023-02-18 ENCOUNTER — Other Ambulatory Visit (HOSPITAL_COMMUNITY): Payer: Self-pay

## 2023-02-18 ENCOUNTER — Other Ambulatory Visit: Payer: Self-pay

## 2023-02-18 DIAGNOSIS — S71102A Unspecified open wound, left thigh, initial encounter: Secondary | ICD-10-CM | POA: Diagnosis not present

## 2023-02-18 MED ORDER — MUPIROCIN 2 % EX OINT
1.0000 | TOPICAL_OINTMENT | Freq: Two times a day (BID) | CUTANEOUS | 0 refills | Status: DC
  Filled 2023-02-18: qty 22, 14d supply, fill #0
  Filled 2023-02-26: qty 22, 14d supply, fill #1

## 2023-02-23 DIAGNOSIS — E785 Hyperlipidemia, unspecified: Secondary | ICD-10-CM | POA: Diagnosis not present

## 2023-02-23 DIAGNOSIS — G4730 Sleep apnea, unspecified: Secondary | ICD-10-CM | POA: Diagnosis not present

## 2023-02-23 DIAGNOSIS — Z0001 Encounter for general adult medical examination with abnormal findings: Secondary | ICD-10-CM | POA: Diagnosis not present

## 2023-02-23 DIAGNOSIS — E1169 Type 2 diabetes mellitus with other specified complication: Secondary | ICD-10-CM | POA: Diagnosis not present

## 2023-02-23 DIAGNOSIS — I10 Essential (primary) hypertension: Secondary | ICD-10-CM | POA: Diagnosis not present

## 2023-02-26 ENCOUNTER — Other Ambulatory Visit (HOSPITAL_COMMUNITY): Payer: Self-pay

## 2023-03-03 ENCOUNTER — Other Ambulatory Visit (HOSPITAL_COMMUNITY): Payer: Self-pay

## 2023-03-04 ENCOUNTER — Other Ambulatory Visit (HOSPITAL_COMMUNITY): Payer: Self-pay

## 2023-03-04 MED ORDER — REPATHA SURECLICK 140 MG/ML ~~LOC~~ SOAJ
140.0000 mg | SUBCUTANEOUS | 2 refills | Status: DC
  Filled 2023-03-04 – 2023-03-12 (×3): qty 2, 28d supply, fill #0
  Filled 2023-04-14: qty 2, 28d supply, fill #1
  Filled 2023-05-15: qty 2, 28d supply, fill #2

## 2023-03-04 MED ORDER — MUPIROCIN 2 % EX OINT
1.0000 | TOPICAL_OINTMENT | Freq: Two times a day (BID) | CUTANEOUS | 0 refills | Status: DC
  Filled 2023-03-04: qty 22, 14d supply, fill #0

## 2023-03-05 ENCOUNTER — Other Ambulatory Visit (HOSPITAL_COMMUNITY): Payer: Self-pay

## 2023-03-05 DIAGNOSIS — S71102A Unspecified open wound, left thigh, initial encounter: Secondary | ICD-10-CM | POA: Diagnosis not present

## 2023-03-06 ENCOUNTER — Other Ambulatory Visit (HOSPITAL_COMMUNITY): Payer: Self-pay

## 2023-03-09 ENCOUNTER — Other Ambulatory Visit (HOSPITAL_COMMUNITY): Payer: Self-pay

## 2023-03-09 MED ORDER — TELMISARTAN 80 MG PO TABS
80.0000 mg | ORAL_TABLET | Freq: Every day | ORAL | 1 refills | Status: DC
  Filled 2023-03-09 – 2023-03-12 (×2): qty 90, 90d supply, fill #0

## 2023-03-10 ENCOUNTER — Other Ambulatory Visit (HOSPITAL_COMMUNITY): Payer: Self-pay

## 2023-03-10 ENCOUNTER — Other Ambulatory Visit: Payer: Self-pay

## 2023-03-10 MED ORDER — DICLOFENAC SODIUM 1 % EX GEL
CUTANEOUS | 1 refills | Status: DC
  Filled 2023-03-10: qty 700, 30d supply, fill #0

## 2023-03-11 ENCOUNTER — Other Ambulatory Visit (HOSPITAL_COMMUNITY): Payer: Self-pay

## 2023-03-12 ENCOUNTER — Other Ambulatory Visit (HOSPITAL_COMMUNITY): Payer: Self-pay

## 2023-03-12 ENCOUNTER — Other Ambulatory Visit: Payer: Self-pay

## 2023-03-14 ENCOUNTER — Other Ambulatory Visit (HOSPITAL_COMMUNITY): Payer: Self-pay

## 2023-03-16 ENCOUNTER — Other Ambulatory Visit (HOSPITAL_COMMUNITY): Payer: Self-pay

## 2023-03-16 ENCOUNTER — Ambulatory Visit (INDEPENDENT_AMBULATORY_CARE_PROVIDER_SITE_OTHER): Payer: PPO | Admitting: Neurology

## 2023-03-16 ENCOUNTER — Encounter: Payer: Self-pay | Admitting: Neurology

## 2023-03-16 VITALS — BP 149/82 | HR 85 | Ht 64.0 in | Wt 243.0 lb

## 2023-03-16 DIAGNOSIS — B182 Chronic viral hepatitis C: Secondary | ICD-10-CM | POA: Diagnosis not present

## 2023-03-16 DIAGNOSIS — M1000 Idiopathic gout, unspecified site: Secondary | ICD-10-CM | POA: Diagnosis not present

## 2023-03-16 DIAGNOSIS — G63 Polyneuropathy in diseases classified elsewhere: Secondary | ICD-10-CM | POA: Diagnosis not present

## 2023-03-16 DIAGNOSIS — G4733 Obstructive sleep apnea (adult) (pediatric): Secondary | ICD-10-CM | POA: Diagnosis not present

## 2023-03-16 DIAGNOSIS — I480 Paroxysmal atrial fibrillation: Secondary | ICD-10-CM | POA: Insufficient documentation

## 2023-03-16 DIAGNOSIS — K766 Portal hypertension: Secondary | ICD-10-CM | POA: Diagnosis not present

## 2023-03-16 DIAGNOSIS — Z6841 Body Mass Index (BMI) 40.0 and over, adult: Secondary | ICD-10-CM | POA: Diagnosis not present

## 2023-03-16 NOTE — Progress Notes (Signed)
Provider:  Melvyn Novas, MD    SLEEP CLINIC FOLLOW up.    Primary Care Physician:  Cathy Rakers, MD [address] [place] [address]     Referring Provider: Loni Gonzalez.   Formerly Dr Cathy Gonzalez patient. This is my first encounter with the patient who was apparently assigned to me.        Chief Complaint according to patient   Patient presents with:     CPAP Patient (Initial Visit)           HISTORY OF PRESENT ILLNESS:  Cathy Gonzalez is a 77 y.o. female patient who is here for revisit 03/16/2023 for  CPAP compliance.  The patient had a HST In 09-2022/ 1 -2024 was a new machine issued. She has undergone neck surgery in 05-2022, and she needed to sleep in a recliner following the cervical fusion. She had seen her neck-surgeon for pain. She has liver cirrhosis and  neuropathy, followed by podiatrist and hepatologist.  She had an ambulatory EEG in March 2024,  .   Chief concern according to patient :  "I want to know if CPAP is working ".  I am seeing Cathy Gonzalez today, an overweight and diabetic seizure patient of Cathy Ege, NP , referred to me for a HST follow up, the test was ordered by NP. The result was that the patient indeed has a severe form of sleep apnea and also associated hypoxia.  She has frequent interruptions of sleep due to nocturia that she relates to a irritated bladder but also to her diabetes.  She does have some chronic joint neck and back pain. She continues to endorse a very high degree of fatigue that fatigue but not of excessive daytime sleepiness.  She endorsed 6 out of 15 points on the geriatric depression scale.  I reviewed her medication which includes Keppra, Glucophage, Robaxin, Lasix, Repatha, Voltaren cream, Lotrimin cream, Norvasc.  She is also pregabalin, [place], potassium supplement.  Her compliance is excellent 100% of days and time it is an average use at time of 7-1/2 hours each night.  She is using an AirSense 10  AutoSet by ResMed with a serial [id number].  Pressure range between 5 and 15 cmH2O with 3 cm EPR her residual AHI is 2.0/h which is an excellent resolution.  Her 95th percentile pressure was 13 cm water her air leak is low, the patient had a follow-up overnight oximetry at home on CPAP which confirmed that CPAP controls her oxygen needs.      EEG per dr Cathy Gonzalez form 12-17-2022,  ordered by Cathy Ege, NP  Clinical History : [name] who presents with follow up for recurrent seizure. After 3 weeks her CPAP stopped fitting properly and does not get a good seal. She was not wearing her CPAP that night and woke up the next morning and had bitten the left side of her tongue, felt tired, head was bloated. She had started Repatha two weeks previous. EEG to evaluate for seizure activity. EEG was negative.  mpression: This is a normal 48 hours ambulatory video EEG recording. There were no events, epileptiform discharges or seizures captured. Normal EEGs, however does not rule out epilepsy.   Cathy Norfolk, MD     HOME SLEEP TEST REPORT ( by [facility])   STUDY DATE: 11-04-2022  DOB: 07/15/1946      ORDERING CLINICIAN: Loni Gonzalez REFERRING CLINICIAN: Dr. Parke Gonzalez  CLINICAL INFORMATION/HISTORY: 09/17/22 SS Cathy Gonzalez here today for follow-up due to recurrent seizure. She was using CPAP, after about 3 weeks her face mask doesn't get a new seal, she was not wearing CPAP that night. She woke up the next morning, had bitten her left tongue, she felt tired, head was bloated. 2 weeks prior had started Repatha, that day had sinus issues, took Muccinex that night. Has remained on Keppra 1500 mg twice a day.  In 2016 she was previously treated for nocturnal seizures with carbamazepine.  She was switched to Keppra due to treatment for hepatitis C.  Has had nocturnal seizures since 1970's. Has cirrhosis.  Would like a new CPAP machine, claims falling apart.   Would like MRI of the brain, given chronic  gait instability more than she thinks normal for age, fullness in her head (could be sinuses), feels something isn't right. Most seizures triggered by stress, nothing stressful right now. What caused it?  No date of the original CPAP set up was quoted.    Epworth sleepiness score: 14 /24. FSS: X   BMI: 40.3 kg/m   Neck Circumference: 19"   FINDINGS:   Sleep Summary:   Total Recording Time (hours, min): Total recording time was 10 hours 1 minute and total sleep time 9 hours and 3 minutes.  There were 19% REM sleep time.    Total sleep latency was 23 minutes and REM sleep latency was 62 minutes.     Respiratory Indices:   Calculated pAHI (per hour):                             REM pAHI: 52/h                                               NREM pAHI: 34/h                            Positional AHI:   The patient only slept in 1 position, the right lateral position with an AHI of 37.3/h.  Snoring data suggest loud snoring with a mean volume of 53 dB present for over 90% of total sleep time.                                                Oxygen Saturation Statistics:    O2 Saturation Range (%): Between a nadir at 76 and a maximum saturation of 98% with a mean saturation of 92%.                                     O2 Saturation (minutes) <89%:   40 minutes. O2 saturation in minutes below 90%: 49 minutes, the equivalent of 9% of total sleep time.  There were a total of 220 desaturation events.        Pulse Rate Statistics:            Pulse Range: Between 62 and 97 bpm with a mean heart rate of 57 bpm.  Please note that this does not reflect cardiac rhythm information -  only heart rate.              IMPRESSION:  This HST confirms the presence of severe sleep apnea which is even worse during REM sleep.  A positional component could not be elicited.  There is hypoxia with a nadir at 76% oxygen saturation and prolonged periods of oxygen desaturation.  This constellation requires positive  airway pressure therapy   RECOMMENDATION: Auto titration CPAP device with heated humidification and interface of patient's choice, preferably ResMed machine with a pressure window between 5 and 15 cm water pressure, and a follow-up visit with a provider within 30 to 90 days of CPAP set up.  Please note that additional weight loss can be beneficial especially for the loud snoring that was documented. Improvement of sleep hygiene with regular bed and regular rise times in the morning is also important regular intake of medication, as a seizure patient she is advised not to take decongestants.    INTERPRETING PHYSICIAN:   Cathy Novas, MD   Medical Director of [facility] at [facility].      Review of Systems: Out of a complete 14 system review, the patient complains of only the following symptoms, and all other reviewed systems are negative.:  Fatigue, sleepiness , snoring, fragmented sleep, Insomnia, RLS with diabetic neuropathy , Nocturia  up to 5 times.    How likely are you to doze in the following situations: 0 = not likely, 1 = slight chance, 2 = moderate chance, 3 = high chance   Sitting and Reading? Watching Television? Sitting inactive in a public place (theater or meeting)? As a passenger in a car for an hour without a break? Lying down in the afternoon when circumstances permit? Sitting and talking to someone? Sitting quietly after lunch without alcohol? In a car, while stopped for a few minutes in traffic?   Total = 7/ 24 points   FSS endorsed at 63/ 63 points. She endorsed 6 out of 15 points on the geriatric depression scale.   Social History   Socioeconomic History   Marital status: Single    Spouse name: Not on file   Number of children: 4   Years of education: 12   Highest education level: Not on file  Occupational History   Occupation: [employer]  Tobacco Use   Smoking status: Never   Smokeless tobacco: Never  Vaping Use   Vaping Use: Never used   Substance and Sexual Activity   Alcohol use: No    Alcohol/week: 0.0 standard drinks of alcohol   Drug use: No   Sexual activity: Never  Other Topics Concern   Not on file  Social History Narrative   Patient occasionally drinks caffeine.   Patient is right handed.   Social Determinants of Health   Financial Resource Strain: Not on file  Food Insecurity: Food Insecurity Present (04/10/2020)   Hunger Vital Sign    Worried About Running Out of Food in the Last Year: Sometimes true    Ran Out of Food in the Last Year: Never true  Transportation Needs: No Transportation Needs (04/10/2020)   PRAPARE - Administrator, Civil Service (Medical): No    Lack of Transportation (Non-Medical): No  Physical Activity: Not on file  Stress: Not on file  Social Connections: Not on file    Family History  Problem Relation Age of Onset   Diabetes Mother    Heart attack Mother    Diabetes Sister    Diabetes  Brother    Cancer Sister        breast   Breast cancer Sister    Seizures Neg Hx     Past Medical History:  Diagnosis Date   Arthritis    Cataract    left cataract removed in 2017   Diabetes mellitus    Diabetic neuropathy (HCC)    Diabetic neuropathy, type II diabetes mellitus (HCC)    Hepatitis C    took tx for    Hypertension    Hypokalemia    Knee pain    right    Morbidly obese (HCC)    Nocturnal seizures (HCC) 05/14/2015   no bad seizure since 1980's   OSA on CPAP    uses cpap 2-3 week   Plantar fasciitis    both feet    Past Surgical History:  Procedure Laterality Date   ABDOMINAL HYSTERECTOMY     partial   ANTERIOR CERVICAL DECOMP/DISCECTOMY FUSION N/A 02/24/2022   Procedure: CERVICAL FOUR- CERVICAL FIVE ANTERIOR CERVICAL DECOMPRESSION/DISCECTOMY FUSION ONE LEVEL WITH ALLOGRAFT AND PLATE;  Surgeon: Eldred Manges, MD;  Location: [facility] OR;  Service: Orthopedics;  Laterality: N/A;   CATARACT EXTRACTION Left    COLONOSCOPY WITH PROPOFOL N/A 08/21/2015    Procedure: COLONOSCOPY WITH PROPOFOL;  Surgeon: [name], MD;  Location: [facility] ENDOSCOPY;  Service: Endoscopy;  Laterality: N/A;   COLONOSCOPY WITH PROPOFOL N/A 08/22/2022   Procedure: COLONOSCOPY WITH PROPOFOL;  Surgeon: [name], MD;  Location: [facility] ENDOSCOPY;  Service: Gastroenterology;  Laterality: N/A;   ESOPHAGOGASTRODUODENOSCOPY (EGD) WITH PROPOFOL N/A 08/21/2015   Procedure: ESOPHAGOGASTRODUODENOSCOPY (EGD) WITH PROPOFOL;  Surgeon: [name], MD;  Location: [facility] ENDOSCOPY;  Service: Endoscopy;  Laterality: N/A;   ESOPHAGOGASTRODUODENOSCOPY (EGD) WITH PROPOFOL N/A 10/08/2018   Procedure: ESOPHAGOGASTRODUODENOSCOPY (EGD) WITH PROPOFOL;  Surgeon: [name], MD;  Location: [facility];  Service: Endoscopy;  Laterality: N/A;   ESOPHAGOGASTRODUODENOSCOPY (EGD) WITH PROPOFOL N/A 08/22/2022   Procedure: ESOPHAGOGASTRODUODENOSCOPY (EGD) WITH PROPOFOL;  Surgeon: [name], MD;  Location: [facility] END[facility];  Service: Gastroenterology;  Laterality: N/A;   EYE SURGERY Bilateral    cataracts   POLYPECTOMY  08/22/2022   Procedure: POLYPECTOMY;  Surgeon: [name], MD;  Location: [facility] ENDOSCOPY;  Service: Gastroenterology;;   TUBAL LIGATION       Current Outpatient Medications on File Prior to Visit  Medication Sig Dispense Refill   amLODipine (NORVASC) 10 MG tablet Take 5 mg by mouth daily.     colchicine 0.6 MG tablet Take 0.6 mg by mouth at bedtime.     diclofenac Sodium (VOLTAREN) 1 % GEL Apply 2 g topically in the morning and at bedtime.     empagliflozin (JARDIANCE) 10 MG TABS tablet Take 1 tablet (10 mg total) by mouth daily. 90 tablet 1   Evolocumab (REPATHA SURECLICK) 140 MG/ML SOAJ Inject 140 mg into the skin every 14 days. 2 mL 2   ezetimibe (ZETIA) 10 MG tablet Take 10 mg by mouth at bedtime.      furosemide (LASIX) 40 MG tablet Take 40 mg by mouth at bedtime.     glucose blood test strip Use as directed to test blood sugar 4 times daily 100 each 6   levETIRAcetam (KEPPRA)  1000 MG tablet Take 1 1/2 tablet in the morning and 2 tablets at bedtime 320 tablet 3   metFORMIN (GLUCOPHAGE) 500 MG tablet Take 1 tablet (500 mg total) by mouth every day at night. 90 tablet 0   montelukast (SINGULAIR) 10 MG tablet Take  10 mg by mouth at bedtime.     mupirocin ointment (BACTROBAN) 2 % Apply 1 topically to affected wound(s) 2 (two) times daily for 2 weeks 22 g 0   ONETOUCH DELICA LANCETS 33G MISC   3   potassium chloride SA (KLOR-CON M) 20 MEQ tablet Take 1 tablet (20 mEq total) by mouth daily. 90 tablet 0   pregabalin (LYRICA) 300 MG capsule Take 1 capsule (300 mg total) by mouth 2 (two) times daily. 180 capsule 1   sucralfate (CARAFATE) 1 G tablet Take 1 g by mouth in the morning, at noon, and at bedtime.     telmisartan (MICARDIS) 80 MG tablet Take 80 mg by mouth daily.   1   [DISCONTINUED] Potassium Chloride ER 20 MEQ TBCR Take 1 tablet (20 mEq total) by mouth daily. 90 tablet 0   No current facility-administered medications on file prior to visit.    Allergies  Allergen Reactions   Acetaminophen     Other reaction(s): GI upset, constipation if taken regularly     DIAGNOSTIC DATA (LABS, IMAGING, TESTING) - I reviewed patient records, labs, notes, testing and imaging myself where available.  Lab Results  Component Value Date   WBC 6.3 12/30/2022   HGB 14.7 12/30/2022   HCT 44.7 12/30/2022   MCV 90.3 12/30/2022   PLT 303 12/30/2022      Component Value Date/Time   NA 140 12/30/2022 0957   NA 142 09/17/2022 1610   K 3.1 (L) 12/30/2022 0957   CL 101 12/30/2022 0957   CO2 26 12/30/2022 0957   GLUCOSE 108 (H) 12/30/2022 0957   BUN 18 12/30/2022 0957   BUN 25 09/17/2022 1610   CREATININE 0.96 12/30/2022 0957   CREATININE 1.02 (H) 06/24/2022 1124   CALCIUM 9.8 12/30/2022 0957   PROT 8.5 (H) 12/30/2022 0957   PROT 7.2 08/11/2016 0833   ALBUMIN 5.0 12/30/2022 0957   ALBUMIN 4.5 08/11/2016 0833   AST 46 (H) 12/30/2022 0957   ALT 55 (H) 12/30/2022 0957    ALKPHOS 72 12/30/2022 0957   BILITOT 0.6 12/30/2022 0957   BILITOT <0.2 08/11/2016 0833   GFRNONAA >60 12/30/2022 0957   GFRNONAA 53 (L) 10/20/2016 1549   GFRAA 52 (L) 02/16/2017 1325   GFRAA 61 10/20/2016 1549   Lab Results  Component Value Date   CHOL 216 (H) 02/11/2014   HDL 65 02/11/2014   LDLCALC 133 (H) 02/11/2014   TRIG 92 02/11/2014   CHOLHDL 3.3 02/11/2014   Lab Results  Component Value Date   HGBA1C 6.1 (H) 02/17/2022   No results found for: "VITAMINB12" Lab Results  Component Value Date   TSH 1.680 09/10/2020    PHYSICAL EXAM:  Today's Vitals   03/16/23 1116  BP: (!) 149/82  Pulse: 85  Weight: 243 lb (110.2 kg)  Height: 5\' 4"  (1.626 m)   Body mass index is 41.71 kg/m.   Wt Readings from Last 3 Encounters:  03/16/23 243 lb (110.2 kg)  12/30/22 227 lb 1.2 oz (103 kg)  10/28/22 229 lb (103.9 kg)     Ht Readings from Last 3 Encounters:  03/16/23 5\' 4"  (1.626 m)  10/28/22 5\' 4"  (1.626 m)  09/17/22 5\' 4"  (1.626 m)      General: The patient is awake, alert and appears not in acute distress. The patient is well groomed. Head: Normocephalic, atraumatic. Neck is supple.  Mallampati 3,  neck circumference:18. 25 inches . Nasal airflow  patent.  Retrognathia is  seen.  Dental status:  Cardiovascular:  Regular rate and cardiac rhythm by pulse,  without distended neck veins. Respiratory: Lungs are clear to auscultation.  Skin:  Without evidence of ankle edema, or rash. Trunk: The patient's posture is erect.   NEUROLOGIC EXAM: The patient is awake and alert, oriented to place and time.   Memory subjective described as intact.  Attention span & concentration ability appears normal.  Speech is fluent,  without  dysarthria, dysphonia or aphasia.  Mood and affect are appropriate.   Cranial nerves: no loss of smell or taste reported  Pupils are equal and briskly reactive to light. Funduscopic exam deferred.  Extraocular movements in vertical and horizontal  planes were intact and without nystagmus. No Diplopia. Visual fields by finger perimetry are intact. Hearing was intact to soft voice and finger rubbing.    Facial sensation intact to fine touch.  Facial motor strength is symmetric and tongue and uvula move midline.  Neck ROM : rotation, tilt and flexion extension were normal for age and shoulder shrug was symmetrical.    Motor exam:  Symmetric bulk, tone and ROM.   Normal tone without cog wheeling, symmetric grip strength .      ASSESSMENT AND PLAN 77 y.o. year old female still part -time employed.   Seen  here for the first time by myself as a TOC patient with:    1) CPAP compliance following a HST with the diagnosis ordered in 09-2022 by Cathy Ege, NP  She is highly compliant now, using a FFM , but needing extra masks , dream ware under the nose, covering the mouth.  2)  Nocturia related to DM and lasix. Furosemide is to be taken in AM and not at night. Significant edema.  Fatigue unrelated to OSA. Needs metabolic panel, TSH, Vit D, B 12  and hepatitis C can be a cause of fatigue. [name]   3) EEG was negative for seizure activity-  still needs to continue with her medications. Had her first seizure in 74s.   Follow up through our NP within 12 months. Continue using CPAP. Keep on meds.   CC: I will share my notes with PCP.  After spending a total time of  30  minutes face to face and additional time for physical and neurologic examination, review of laboratory studies,  personal review of imaging studies, reports and results of other testing and review of referral information / records as far as provided in visit,   Electronically signed by: Cathy Novas, MD 03/16/2023 11:52 AM  [facility] [employer] Board certified by The [employer] of Sleep Medicine and Diplomate of the [employer] of Sleep Medicine. Board certified In Neurology through the ABPN, Fellow of the [employer] of  Neurology. [facility].

## 2023-03-16 NOTE — Patient Instructions (Addendum)
his HST confirms the presence of severe sleep apnea which is even worse during REM sleep. A positional component could not be elicited. There is hypoxia with a nadir at 76% oxygen saturation and prolonged periods of oxygen desaturation. This constellation requires positive airway pressure therapy .ResMed machine with a pressure window between 5 and 15 cm water pressure,   77 y.o. year old female still part -time employed.    Seen  here for the first time by myself as a TOC patient with:     1) CPAP compliance following a HST with the diagnosis ordered in 09-2022 by Margie Ege, NP  She is highly compliant now, using a FFM , but needing extra masks , dream ware under the nose, covering the mouth.  2)  Nocturia related to DM and lasix. Furosemide is to be taken in AM and not at night. Significant edema.  Fatigue unrelated to OSA. Needs metabolic panel, TSH, Vit D, B 12  and hepatitis C can be a cause of fatigue. [name]    3) EEG was negative for seizure activity-  still needs to continue with her medications. Had her first seizure in 81s.    Follow up through our NP within 12 months. Continue using CPAP. Keep on meds.

## 2023-03-17 ENCOUNTER — Telehealth: Payer: Self-pay | Admitting: Orthopaedic Surgery

## 2023-03-17 NOTE — Telephone Encounter (Signed)
Patient called advised she still want an appointment to be seen.  I advised patient to go to the ER of Urgent care per [facility] if patient can not turn her head and neck is stiff.    Pt# [phone]

## 2023-03-18 ENCOUNTER — Telehealth: Payer: Self-pay

## 2023-03-18 ENCOUNTER — Ambulatory Visit: Payer: PPO | Admitting: Podiatry

## 2023-03-19 ENCOUNTER — Ambulatory Visit (INDEPENDENT_AMBULATORY_CARE_PROVIDER_SITE_OTHER): Payer: PPO | Admitting: Podiatrist

## 2023-03-19 ENCOUNTER — Other Ambulatory Visit: Payer: Self-pay

## 2023-03-19 ENCOUNTER — Encounter: Payer: Self-pay | Admitting: Podiatrist

## 2023-03-19 ENCOUNTER — Telehealth: Payer: Self-pay | Admitting: Neurology

## 2023-03-19 ENCOUNTER — Other Ambulatory Visit (HOSPITAL_COMMUNITY): Payer: Self-pay

## 2023-03-19 DIAGNOSIS — R609 Edema, unspecified: Secondary | ICD-10-CM

## 2023-03-19 DIAGNOSIS — M778 Other enthesopathies, not elsewhere classified: Secondary | ICD-10-CM

## 2023-03-19 MED ORDER — METFORMIN HCL 500 MG PO TABS
500.0000 mg | ORAL_TABLET | Freq: Every evening | ORAL | 1 refills | Status: DC
  Filled 2023-03-19 – 2023-04-08 (×2): qty 90, 90d supply, fill #0
  Filled 2023-06-30: qty 90, 90d supply, fill #1

## 2023-03-19 MED ORDER — TRIAMCINOLONE ACETONIDE 10 MG/ML IJ SUSP
10.0000 mg | Freq: Once | INTRAMUSCULAR | Status: AC
  Administered 2023-03-19: 10 mg

## 2023-03-19 MED ORDER — REPATHA SURECLICK 140 MG/ML ~~LOC~~ SOAJ
140.0000 mg | SUBCUTANEOUS | 2 refills | Status: DC
  Filled 2023-03-19: qty 2, 28d supply, fill #0

## 2023-03-19 MED ORDER — DICLOFENAC SODIUM 1 % EX GEL
CUTANEOUS | 5 refills | Status: DC
  Filled 2023-03-19: qty 500, 62d supply, fill #0
  Filled 2023-04-29: qty 800, 90d supply, fill #1
  Filled 2023-05-06: qty 500, 62d supply, fill #1
  Filled 2023-06-01 – 2023-06-06 (×5): qty 500, 62d supply, fill #2
  Filled 2023-06-15: qty 500, 60d supply, fill #2
  Filled 2023-06-18: qty 100, 14d supply, fill #2
  Filled 2023-06-18: qty 500, 62d supply, fill #2
  Filled 2023-06-27: qty 500, 45d supply, fill #2
  Filled 2023-07-04 – 2023-07-13 (×2): qty 500, 62d supply, fill #2

## 2023-03-19 NOTE — Progress Notes (Signed)
Chief Complaint  Patient presents with   Diabetes    Pt stated that she has been having a lot of swelling and burning with her feet      HPI: Patient is 77 y.o. female who presents today for swelling and burning on her feet.  She relates that she started a new medication which she wonders if that could be attributing to her swelling.  She also states that her ankles have been hurting her and wonders if this could be causing some of her swelling.  She has generalized burning of her feet which she states the Lyrica has helped.   Allergies  Allergen Reactions   Acetaminophen     Other reaction(s): GI upset, constipation if taken regularly    Review of systems is negative except as noted in the HPI.  Denies nausea/ vomiting/ fevers/ chills or night sweats.   Denies difficulty breathing, denies calf pain or tenderness  Physical Exam  Patient is awake, alert, and oriented x 3.  In no acute distress.    Vascular status is intact with palpable pedal pulses 2/4 DP and PT bilateral and capillary refill time less than 3 seconds bilateral.  Edema from her feet up her legs is noted.  Her skin appears shiny and atrophic from the swelling.  Pitting edema is noted anterior shins bilateral.  Neurological exam reveals epicritic and protective sensation grossly diminished bilateral consistent with her dry diabetic peripheral neuropathy.  Dermatological exam reveals skin is supple and dry to bilateral feet.  No open lesions present.    Musculoskeletal exam: Musculature intact with dorsiflexion, plantarflexion, inversion, eversion.  Pain at the ankle joint bilateral near the sinus tarsi region is noted.  No discrete pain midfoot or plantar fascia is noted bilateral.   Assessment:   ICD-10-CM   1. Capsulitis of left foot  M77.8     2. Capsulitis of right foot  M77.8     3. Edema, unspecified type  R60.9        Plan: Discussed treatment options and alternatives.  Discussed that the edema is likely  not from her feet and more likely from another cause.  Discussed she may need to increase her Lasix or change medications.  She is seeing Dr. Parke Simmers later this month and will discuss with Dr. Parke Simmers her options.  She did state that she is had pain at her ankle joints as she points the area of the sinus tarsi region.  I did discuss we can try injections to see if this will be beneficial the patient agreed she would like to proceed.  Injections were carried out today without complication.  She will see how she does if this fails to relieve her symptoms she will call.  Procedure: Injection bilateral ankles Discussed alternatives, risks, complications and verbal consent was obtained.  Location: sinus tarsi right and left Skin Prep: Alcohol. Injectate: 1cc 0.5% marcaine plain, 1 cc 10 mg Kenalog Disposition: Patient tolerated procedure well. Injection site dressed with a band-aid.  Post-injection care was discussed and return precautions discussed.

## 2023-03-19 NOTE — Telephone Encounter (Signed)
I called patient and worked in for Monday.

## 2023-03-19 NOTE — Telephone Encounter (Signed)
Pt called stating that the provider here cx 3 of her medications that another provider had prescribed. Pt is irate, demanding answers and a solution and was very insulting. Please advise.

## 2023-03-19 NOTE — Telephone Encounter (Signed)
Contacted pt back, she stated the pharmacy will not fill her Voltaren Gel, Repatha SureClick or Metformin due to it being d/c'd. I informed her she had those meds on file 2-3 times so we removed the duplicates. I advised her I will contact the pharmacy to informed them it was just duplicated. I called [facility] and spoke to Maralyn Sago, she stated we can not remove the duplicate and "we just have to live with it if not it will d/c from the pharmacy all together, its a Epic thing". I informed her we have always removed meds a duplicated, she stated well don't because we wont be able to fill it without a new Rx, she tries to tell everyone that. I spoke to Tylene Fantasia about this, she stated to put a IT ticket in and send msg to PCP to see if they can refill.  PCP is not in Epic so I called and spoke to [place]. I informed her the pharmacy wouldn't refill those meds and explained it to her. She stated she will reorder and send to [facility] pharm now. I thanked her for her assistance.

## 2023-03-19 NOTE — Patient Instructions (Signed)
Peripheral Edema  Peripheral edema is swelling that is caused by a buildup of fluid. Peripheral edema most often affects the lower legs, ankles, and feet. It can also develop in the arms, hands, and face. The area of the body that has peripheral edema will look swollen. It may also feel heavy or warm. Your clothes may start to feel tight. Pressing on the area may make a temporary dent in your skin (pitting edema). You may not be able to move your swollen arm or leg as much as usual. There are many causes of peripheral edema. It can happen because of a complication of other conditions such as heart failure, kidney disease, or a problem with your circulation. It also can be a side effect of certain medicines or happen because of an infection. It often happens to women during pregnancy. Sometimes, the cause is not known. Follow these instructions at home: Managing pain, stiffness, and swelling  Raise (elevate) your legs while you are sitting or lying down. Move around often to prevent stiffness and to reduce swelling. Do not sit or stand for long periods of time. Do not wear tight clothing. Do not wear garters on your upper legs. Exercise your legs to get your circulation going. This helps to move the fluid back into your blood vessels, and it may help the swelling go down. Wear compression stockings as told by your health care provider. These stockings help to prevent blood clots and reduce swelling in your legs. It is important that these are the correct size. These stockings should be prescribed by your doctor to prevent possible injuries. If elastic bandages or wraps are recommended, use them as told by your health care provider. Medicines Take over-the-counter and prescription medicines only as told by your health care provider. Your health care provider may prescribe medicine to help your body get rid of excess water (diuretic). Take this medicine if you are told to take it. General  instructions Eat a low-salt (low-sodium) diet as told by your health care provider. Sometimes, eating less salt may reduce swelling. Pay attention to any changes in your symptoms. Moisturize your skin daily to help prevent skin from cracking and draining. Keep all follow-up visits. This is important. Contact a health care provider if: You have a fever. You have swelling in only one leg. You have increased swelling, redness, or pain in one or both of your legs. You have drainage or sores at the area where you have edema. Get help right away if: You have edema that starts suddenly or is getting worse, especially if you are pregnant or have a medical condition. You develop shortness of breath, especially when you are lying down. You have pain in your chest or abdomen. You feel weak. You feel like you will faint. These symptoms may be an emergency. Get help right away. Call 911. Do not wait to see if the symptoms will go away. Do not drive yourself to the hospital. Summary Peripheral edema is swelling that is caused by a buildup of fluid. Peripheral edema most often affects the lower legs, ankles, and feet. Move around often to prevent stiffness and to reduce swelling. Do not sit or stand for long periods of time. Pay attention to any changes in your symptoms. Contact a health care provider if you have edema that starts suddenly or is getting worse, especially if you are pregnant or have a medical condition. Get help right away if you develop shortness of breath, especially when lying down.   This information is not intended to replace advice given to you by your health care provider. Make sure you discuss any questions you have with your health care provider. Document Revised: 05/27/2021 Document Reviewed: 05/27/2021 Elsevier Patient Education  2024 Elsevier Inc.  

## 2023-03-20 NOTE — Telephone Encounter (Signed)
That's a horrible EPIC error if this is true- we see patient s with multiple duplicate orders all the time and I usually remove the oldest scripts and keep the last by date.  Never heard of this being a hindrance to refills in the future. CD  Lets share this comment with providers on team- CD

## 2023-03-23 ENCOUNTER — Ambulatory Visit: Payer: PPO | Admitting: Orthopaedic Surgery

## 2023-03-23 ENCOUNTER — Encounter: Payer: Self-pay | Admitting: Orthopaedic Surgery

## 2023-03-23 ENCOUNTER — Other Ambulatory Visit (INDEPENDENT_AMBULATORY_CARE_PROVIDER_SITE_OTHER): Payer: PPO

## 2023-03-23 ENCOUNTER — Other Ambulatory Visit (HOSPITAL_COMMUNITY): Payer: Self-pay

## 2023-03-23 VITALS — BP 131/82 | HR 74 | Ht 64.0 in | Wt 230.0 lb

## 2023-03-23 DIAGNOSIS — Z981 Arthrodesis status: Secondary | ICD-10-CM

## 2023-03-23 DIAGNOSIS — M542 Cervicalgia: Secondary | ICD-10-CM | POA: Diagnosis not present

## 2023-03-23 NOTE — Progress Notes (Signed)
Office Visit Note   Patient: Cathy Gonzalez           Date of Birth: 1946/04/27           MRN: [id number] Visit Date: 03/23/2023              Requested by: Renaye Rakers, MD [address] [place],  [place] [address] PCP: Renaye Rakers, MD   Assessment & Plan: Visit Diagnoses:  1. Neck pain   2. S/P cervical spinal fusion     Plan: Patient can return in 6 months we will obtain AP and lateral flexion-extension radiographs on return.  She will talk with her boss about trying to get the vent close which should help her symptoms.  She states whenever she keeps the air off her neck she feels much better and has minimal problems.  Follow-Up Instructions: No follow-ups on file.   Orders:  Orders Placed This Encounter  Procedures   XR Cervical Spine 2 or 3 views   No orders of the defined types were placed in this encounter.     Procedures: No procedures performed   Clinical Data: No additional findings.   Subjective: Chief Complaint  Patient presents with   Neck - Pain    HPI 77 year old female returns post C4-5 cervical fusion where she had mild cord changes consistent with mild edema or myelomalacia.  She states before surgery they closed with it off that was directly overhead blowing on her neck where she works at [employer] as a Conservation officer, nature.  She states after surgery the management open and she is requested to have it reclosed but she states the boss has to get a ladder from home and bring it in to be able to get up on enough to close the vent.  She has some splint she is worn at night.  Last week she is having increased problems with her neck states she wears a coat at work and has a wrap that she puts around her neck.  Previous MRI showed multilevel large anterior bone spurs with preservation of disc space height but multilevel disc protrusions without severe stenosis at other levels.  She denies myelopathic symptoms.  She denies myelopathic symptoms.  Review of Systems updated  unchanged   Objective: Vital Signs: BP 131/82   Pulse 74   Ht 5\' 4"  (1.626 m)   Wt 230 lb (104.3 kg)   BMI 39.48 kg/m   Physical Exam Constitutional:      Appearance: She is well-developed.  HENT:     Head: Normocephalic.     Right Ear: External ear normal.     Left Ear: External ear normal. There is no impacted cerumen.  Eyes:     Pupils: Pupils are equal, round, and reactive to light.  Neck:     Thyroid: No thyromegaly.     Trachea: No tracheal deviation.  Cardiovascular:     Rate and Rhythm: Normal rate.  Pulmonary:     Effort: Pulmonary effort is normal.  Abdominal:     Palpations: Abdomen is soft.  Musculoskeletal:     Cervical back: No rigidity.  Skin:    General: Skin is warm and dry.  Neurological:     Mental Status: She is alert and oriented to person, place, and time.  Psychiatric:        Behavior: Behavior normal.     Ortho Exam no thenar atrophy.  No brachial plexus tenderness.  50% cervical rotation.  Negative Spurling  right and left.  Mild discomfort with carpal compression.  No lower extremity clonus.  Specialty Comments:  No specialty comments available.  Imaging: No results found.   PMFS History: Patient Active Problem List   Diagnosis Date Noted   Gouty neuritis (HCC) 03/16/2023   Paroxysmal A-fib (HCC) 03/16/2023   Pain in right hand 10/28/2022   Impingement syndrome of right shoulder 08/12/2022   S/P cervical spinal fusion 06/25/2022   Pain due to onychomycosis of toenails of both feet 05/06/2022   Cervical stenosis of spine 02/24/2022   Neck pain 12/03/2021   Abnormal liver function tests 02/12/2021   Diarrhea 02/12/2021   Gastro-esophageal reflux disease without esophagitis 02/12/2021   Periumbilical pain 02/12/2021   Personal history of colonic polyps 02/12/2021   Portal hypertension (HCC) 02/12/2021   Screening for malignant neoplasm of colon 02/12/2021   Type 2 diabetes mellitus without complications (HCC) 02/12/2021    Primary osteoarthritis, left hand 11/07/2020   Arthritis of carpometacarpal (CMC) joint of thumb 07/18/2020   Bilateral primary osteoarthritis of knee 06/20/2020   Bilateral primary osteoarthritis of hip 06/07/2020   Right thigh pain 05/09/2020   Low back pain 07/14/2019   OSA on CPAP 05/16/2019   Diabetic neuropathy associated with type 2 diabetes mellitus (HCC) 12/13/2018   Chronic pain of right knee 12/09/2018   Thumb pain, left 12/09/2018   Dizziness and giddiness 01/14/2016   Hepatic cirrhosis (HCC) 07/26/2015   Nocturnal seizures (HCC) 05/14/2015   Chronic hepatitis C without hepatic coma (HCC) 03/14/2015   Vertigo, central 02/11/2014   Difficulty walking 02/11/2014   Diabetes mellitus (HCC) 02/11/2014   Dysphagia 02/11/2014   Class 3 severe obesity with serious comorbidity and body mass index (BMI) of 40.0 to 44.9 in adult, unspecified obesity type (HCC) 07/26/2010   Essential hypertension, benign 07/26/2010   DEGENERATIVE JOINT DISEASE, GENERALIZED 07/26/2010   Past Medical History:  Diagnosis Date   Arthritis    Cataract    left cataract removed in 2017   Diabetes mellitus    Diabetic neuropathy (HCC)    Diabetic neuropathy, type II diabetes mellitus (HCC)    Hepatitis C    took tx for    Hypertension    Hypokalemia    Knee pain    right    Morbidly obese (HCC)    Nocturnal seizures (HCC) 05/14/2015   no bad seizure since 1980's   OSA on CPAP    uses cpap 2-3 week   Plantar fasciitis    both feet    Family History  Problem Relation Age of Onset   Diabetes Mother    Heart attack Mother    Diabetes Sister    Diabetes Brother    Cancer Sister        breast   Breast cancer Sister    Seizures Neg Hx     Past Surgical History:  Procedure Laterality Date   ABDOMINAL HYSTERECTOMY     partial   ANTERIOR CERVICAL DECOMP/DISCECTOMY FUSION N/A 02/24/2022   Procedure: CERVICAL FOUR- CERVICAL FIVE ANTERIOR CERVICAL DECOMPRESSION/DISCECTOMY FUSION ONE LEVEL WITH  ALLOGRAFT AND PLATE;  Surgeon: Eldred Manges, MD;  Location: [facility];  Service: Orthopedics;  Laterality: N/A;   CATARACT EXTRACTION Left    COLONOSCOPY WITH PROPOFOL N/A 08/21/2015   Procedure: COLONOSCOPY WITH PROPOFOL;  Surgeon: [name], MD;  Location: [facility];  Service: Endoscopy;  Laterality: N/A;   COLONOSCOPY WITH PROPOFOL N/A 08/22/2022   Procedure: COLONOSCOPY WITH PROPOFOL;  Surgeon: [name], MD;  Location: [facility];  Service: Gastroenterology;  Laterality: N/A;   ESOPHAGOGASTRODUODENOSCOPY (EGD) WITH PROPOFOL N/A 08/21/2015   Procedure: ESOPHAGOGASTRODUODENOSCOPY (EGD) WITH PROPOFOL;  Surgeon: [name], MD;  [facility]: [facility];  Service: Endoscopy;  Laterality: N/A;   ESOPHAGOGASTRODUODENOSCOPY (EGD) WITH PROPOFOL N/A 10/08/2018   Procedure: ESOPHAGOGASTRODUODENOSCOPY (EGD) WITH PROPOFOL;  Surgeon: [name], MD;  [facility];  Service: Endoscopy;  Laterality: N/A;   ESOPHAGOGASTRODUODENOSCOPY (EGD) WITH PROPOFOL N/A 08/22/2022   Procedure: ESOPHAGOGASTRODUODENOSCOPY (EGD) WITH PROPOFOL;  Surgeon: [name], MD;  [facility];  Service: Gastroenterology;  Laterality: N/A;   EYE SURGERY Bilateral    cataracts   POLYPECTOMY  08/22/2022   Procedure: POLYPECTOMY;  Surgeon: [name], MD;  Location: [facility];  Service: Gastroenterology;;   TUBAL LIGATION     Social History   Occupational History   Occupation: [employer]  Tobacco Use   Smoking status: Never   Smokeless tobacco: Never  Vaping Use   Vaping Use: Never used  Substance and Sexual Activity   Alcohol use: No    Alcohol/week: 0.0 standard drinks of alcohol   Drug use: No   Sexual activity: Never

## 2023-03-26 ENCOUNTER — Other Ambulatory Visit (HOSPITAL_COMMUNITY): Payer: Self-pay

## 2023-03-27 ENCOUNTER — Ambulatory Visit (INDEPENDENT_AMBULATORY_CARE_PROVIDER_SITE_OTHER): Payer: PPO | Admitting: Podiatry

## 2023-03-27 ENCOUNTER — Encounter: Payer: Self-pay | Admitting: Podiatry

## 2023-03-27 VITALS — BP 151/79

## 2023-03-27 DIAGNOSIS — M79675 Pain in left toe(s): Secondary | ICD-10-CM

## 2023-03-27 DIAGNOSIS — M79674 Pain in right toe(s): Secondary | ICD-10-CM | POA: Diagnosis not present

## 2023-03-27 DIAGNOSIS — B351 Tinea unguium: Secondary | ICD-10-CM

## 2023-03-27 DIAGNOSIS — E1149 Type 2 diabetes mellitus with other diabetic neurological complication: Secondary | ICD-10-CM | POA: Diagnosis not present

## 2023-03-27 DIAGNOSIS — E1140 Type 2 diabetes mellitus with diabetic neuropathy, unspecified: Secondary | ICD-10-CM | POA: Diagnosis not present

## 2023-03-27 NOTE — Progress Notes (Signed)
This patient returns to my office for at risk foot care.  This patient requires this care by a professional since this patient will be at risk due to having type 2 diabetes. Patient has neuropathy.  This patient is unable to cut nails herself since the patient cannot reach her nails.These nails are painful walking and wearing shoes.  This patient presents for at risk foot care today.  General Appearance  Alert, conversant and in no acute stress.  Vascular  Dorsalis pedis and posterior tibial  pulses are palpable  bilaterally.  Capillary return is within normal limits  bilaterally. Temperature is within normal limits  bilaterally.  Neurologic  Senn-Weinstein monofilament wire test within normal limits  bilaterally. Muscle power within normal limits bilaterally.  Nails Thick disfigured discolored nails with subungual debris  from second  to fifth toes bilaterally. No evidence of bacterial infection or drainage bilaterally.  Orthopedic  No limitations of motion  feet .  No crepitus or effusions noted.  No bony pathology or digital deformities noted.  HAV  B/L.  Mild swelling feet  B/L.  Skin  normotropic skin with no porokeratosis noted bilaterally.  No signs of infections or ulcers noted.     Onychomycosis  Pain in right toes  Pain in left toes  Consent was obtained for treatment procedures.   Mechanical debridement of nails 2-5  bilaterally performed with a nail nipper.  Filed with dremel without incident.    Return office visit    10 weeks                  Told patient to return for periodic foot care and evaluation due to potential at risk complications.   Byan Poplaski DPM   

## 2023-04-01 ENCOUNTER — Other Ambulatory Visit: Payer: Self-pay | Admitting: Family Medicine

## 2023-04-01 DIAGNOSIS — Z1231 Encounter for screening mammogram for malignant neoplasm of breast: Secondary | ICD-10-CM

## 2023-04-08 ENCOUNTER — Other Ambulatory Visit (HOSPITAL_COMMUNITY): Payer: Self-pay

## 2023-04-08 ENCOUNTER — Other Ambulatory Visit: Payer: Self-pay

## 2023-04-08 MED ORDER — EZETIMIBE 10 MG PO TABS
10.0000 mg | ORAL_TABLET | Freq: Every day | ORAL | 1 refills | Status: DC
  Filled 2023-04-08: qty 90, 90d supply, fill #0
  Filled 2023-05-06: qty 90, 90d supply, fill #1

## 2023-04-10 ENCOUNTER — Other Ambulatory Visit (HOSPITAL_COMMUNITY): Payer: Self-pay

## 2023-04-13 DIAGNOSIS — E785 Hyperlipidemia, unspecified: Secondary | ICD-10-CM | POA: Diagnosis not present

## 2023-04-13 DIAGNOSIS — I10 Essential (primary) hypertension: Secondary | ICD-10-CM | POA: Diagnosis not present

## 2023-04-13 DIAGNOSIS — K760 Fatty (change of) liver, not elsewhere classified: Secondary | ICD-10-CM | POA: Diagnosis not present

## 2023-04-13 DIAGNOSIS — G629 Polyneuropathy, unspecified: Secondary | ICD-10-CM | POA: Diagnosis not present

## 2023-04-13 DIAGNOSIS — Z6841 Body Mass Index (BMI) 40.0 and over, adult: Secondary | ICD-10-CM | POA: Diagnosis not present

## 2023-04-13 DIAGNOSIS — R635 Abnormal weight gain: Secondary | ICD-10-CM | POA: Diagnosis not present

## 2023-04-13 DIAGNOSIS — E1169 Type 2 diabetes mellitus with other specified complication: Secondary | ICD-10-CM | POA: Diagnosis not present

## 2023-04-14 ENCOUNTER — Other Ambulatory Visit (HOSPITAL_COMMUNITY): Payer: Self-pay

## 2023-04-15 ENCOUNTER — Other Ambulatory Visit (HOSPITAL_COMMUNITY): Payer: Self-pay

## 2023-04-15 ENCOUNTER — Other Ambulatory Visit: Payer: Self-pay

## 2023-04-16 ENCOUNTER — Ambulatory Visit (INDEPENDENT_AMBULATORY_CARE_PROVIDER_SITE_OTHER): Payer: PPO | Admitting: Podiatry

## 2023-04-16 DIAGNOSIS — M722 Plantar fascial fibromatosis: Secondary | ICD-10-CM

## 2023-04-16 DIAGNOSIS — E1142 Type 2 diabetes mellitus with diabetic polyneuropathy: Secondary | ICD-10-CM

## 2023-04-16 DIAGNOSIS — M21622 Bunionette of left foot: Secondary | ICD-10-CM

## 2023-04-16 DIAGNOSIS — M7752 Other enthesopathy of left foot: Secondary | ICD-10-CM

## 2023-04-16 NOTE — Progress Notes (Signed)
Patient presents today to measured for  diabetic shoes pt did not want inserts.  Patient was measured for  1 pair of diabetic shoes   Ht 5'4 Wt 230 Size 10.5 w Shoe type [id number]   Re-appointment for regularly scheduled diabetic foot care visits or if they should experience any trouble with the shoes or insoles.

## 2023-04-18 ENCOUNTER — Other Ambulatory Visit (HOSPITAL_COMMUNITY): Payer: Self-pay

## 2023-04-21 ENCOUNTER — Encounter: Payer: Self-pay | Admitting: Orthopaedic Surgery

## 2023-04-21 ENCOUNTER — Ambulatory Visit: Payer: PPO | Admitting: Orthopaedic Surgery

## 2023-04-21 VITALS — BP 128/80 | Ht 64.0 in | Wt 230.0 lb

## 2023-04-21 DIAGNOSIS — Z981 Arthrodesis status: Secondary | ICD-10-CM

## 2023-04-21 DIAGNOSIS — M542 Cervicalgia: Secondary | ICD-10-CM

## 2023-04-21 NOTE — Progress Notes (Signed)
Office Visit Note   Patient: Cathy Gonzalez           Date of Birth: July 22, 1946           MRN: [id number] Visit Date: 04/21/2023              Requested by: Renaye Rakers, MD [address] [place],  [place] [address] PCP: Renaye Rakers, MD   Assessment & Plan: Visit Diagnoses:  1. S/P cervical spinal fusion   2. Neck pain     Plan: Note written for work to  close or blocked vent directly over worksite which is blowing on her neck.  She has persistent symptoms she will call us back and we will obtain cervical CT scan to evaluate her for potential pseudoarthrosis we discussed possible posterior fusion with wiring and graft.  Recheck 2 months.  Follow-Up Instructions: No follow-ups on file.   Orders:  No orders of the defined types were placed in this encounter.  No orders of the defined types were placed in this encounter.     Procedures: No procedures performed   Clinical Data: No additional findings.   Subjective: Chief Complaint  Patient presents with   Neck - Pain    HPI 77 year old female returns now 1 year post C4-5 discectomy where she had cord protrusion and some early myelomalacia or cord edema from the disc protrusion.  Her persistent problem has been event at her worksite the blows directly down on her neck she wears a scarf every day and states if she does not have cold air blowing on her neck she does not have much problem.  Repeat x-rays that showed no loosening of the screw but she does have a some evidence of lucent lines around the graft suggesting either slow incorporation of the graft, delayed or pseudoarthrosis.  Pain radiates and stops at her elbow does not go into her hand she can reach overhead easily with both hands.  Additionally she does have diabetes.  She has some problems with prolonged walking and some balance issues.  Brain MRI showed scattered foci of subcortical and deep white matter with chronic microvascular ischemic changes.  Mild  progression from 2015 MRI.  Review of Systems all systems noncontributory to HPI.   Objective: Vital Signs: BP 128/80   Ht 5\' 4"  (1.626 m)   Wt 230 lb (104.3 kg)   BMI 39.48 kg/m   Physical Exam Constitutional:      Appearance: She is well-developed.  HENT:     Head: Normocephalic.     Right Ear: External ear normal.     Left Ear: External ear normal. There is no impacted cerumen.  Eyes:     Pupils: Pupils are equal, round, and reactive to light.  Neck:     Thyroid: No thyromegaly.     Trachea: No tracheal deviation.  Cardiovascular:     Rate and Rhythm: Normal rate.  Pulmonary:     Effort: Pulmonary effort is normal.  Abdominal:     Palpations: Abdomen is soft.  Musculoskeletal:     Cervical back: No rigidity.  Skin:    General: Skin is warm and dry.  Neurological:     Mental Status: She is alert and oriented to person, place, and time.  Psychiatric:        Behavior: Behavior normal.     Ortho Exam good rotation forward flexion and extension of her neck without discomfort.  She has some brachial plexus tenderness on  the right reflexes are intact.  She completed keeps a glove on her left hand for arthritis CMC joint.  Specialty Comments:  No specialty comments available.  Imaging: Addendum  ADDENDUM REPORT: 12/25/2021 13:15   ADDENDUM: Small focus of T2 hyperintensity within the left eccentric cord at C4-C5, which could represent mild edema and/or myelomalacia given disc protrusion at this level. Artifact is also a consideration given there appears to be some artifact in this region.   These results will be called to the ordering clinician or representative by the Radiologist Assistant, and communication documented in the PACS or [employer].     Electronically Signed   By: Feliberto Harts M.D.   On: 12/25/2021 13:15     PMFS History: Patient Active Problem List   Diagnosis Date Noted   Gouty neuritis (HCC) 03/16/2023   Paroxysmal A-fib  (HCC) 03/16/2023   Pain in right hand 10/28/2022   Impingement syndrome of right shoulder 08/12/2022   S/P cervical spinal fusion 06/25/2022   Pain due to onychomycosis of toenails of both feet 05/06/2022   Cervical stenosis of spine 02/24/2022   Neck pain 12/03/2021   Abnormal liver function tests 02/12/2021   Diarrhea 02/12/2021   Gastro-esophageal reflux disease without esophagitis 02/12/2021   Periumbilical pain 02/12/2021   Personal history of colonic polyps 02/12/2021   Portal hypertension (HCC) 02/12/2021   Screening for malignant neoplasm of colon 02/12/2021   Type 2 diabetes mellitus without complications (HCC) 02/12/2021   Primary osteoarthritis, left hand 11/07/2020   Arthritis of carpometacarpal (CMC) joint of thumb 07/18/2020   Bilateral primary osteoarthritis of knee 06/20/2020   Bilateral primary osteoarthritis of hip 06/07/2020   Right thigh pain 05/09/2020   Low back pain 07/14/2019   OSA on CPAP 05/16/2019   Diabetic neuropathy associated with type 2 diabetes mellitus (HCC) 12/13/2018   Chronic pain of right knee 12/09/2018   Thumb pain, left 12/09/2018   Dizziness and giddiness 01/14/2016   Hepatic cirrhosis (HCC) 07/26/2015   Nocturnal seizures (HCC) 05/14/2015   Chronic hepatitis C without hepatic coma (HCC) 03/14/2015   Vertigo, central 02/11/2014   Difficulty walking 02/11/2014   Diabetes mellitus (HCC) 02/11/2014   Dysphagia 02/11/2014   Class 3 severe obesity with serious comorbidity and body mass index (BMI) of 40.0 to 44.9 in adult, unspecified obesity type (HCC) 07/26/2010   Essential hypertension, benign 07/26/2010   DEGENERATIVE JOINT DISEASE, GENERALIZED 07/26/2010   Past Medical History:  Diagnosis Date   Arthritis    Cataract    left cataract removed in 2017   Diabetes mellitus    Diabetic neuropathy (HCC)    Diabetic neuropathy, type II diabetes mellitus (HCC)    Hepatitis C    took tx for    Hypertension    Hypokalemia    Knee pain     right    Morbidly obese (HCC)    Nocturnal seizures (HCC) 05/14/2015   no bad seizure since 1980's   OSA on CPAP    uses cpap 2-3 week   Plantar fasciitis    both feet    Family History  Problem Relation Age of Onset   Diabetes Mother    Heart attack Mother    Diabetes Sister    Diabetes Brother    Cancer Sister        breast   Breast cancer Sister    Seizures Neg Hx     Past Surgical History:  Procedure Laterality Date   ABDOMINAL HYSTERECTOMY  partial   ANTERIOR CERVICAL DECOMP/DISCECTOMY FUSION N/A 02/24/2022   Procedure: CERVICAL FOUR- CERVICAL FIVE ANTERIOR CERVICAL DECOMPRESSION/DISCECTOMY FUSION ONE LEVEL WITH ALLOGRAFT AND PLATE;  Surgeon: Eldred Manges, MD;  Location: [facility];  Service: Orthopedics;  Laterality: N/A;   CATARACT EXTRACTION Left    COLONOSCOPY WITH PROPOFOL N/A 08/21/2015   Procedure: COLONOSCOPY WITH PROPOFOL;  Surgeon: [name], MD;  Location: [facility];  Service: Endoscopy;  Laterality: N/A;   COLONOSCOPY WITH PROPOFOL N/A 08/22/2022   Procedure: COLONOSCOPY WITH PROPOFOL;  Surgeon: [name], MD;  Location: [facility];  Service: Gastroenterology;  Laterality: N/A;   ESOPHAGOGASTRODUODENOSCOPY (EGD) WITH PROPOFOL N/A 08/21/2015   Procedure: ESOPHAGOGASTRODUODENOSCOPY (EGD) WITH PROPOFOL;  Surgeon: [name], MD;  [facility];  Service: Endoscopy;  Laterality: N/A;   ESOPHAGOGASTRODUODENOSCOPY (EGD) WITH PROPOFOL N/A 10/08/2018   Procedure: ESOPHAGOGASTRODUODENOSCOPY (EGD) WITH PROPOFOL;  Surgeon: [name], MD;  [facility]: [facility];  Service: Endoscopy;  Laterality: N/A;   ESOPHAGOGASTRODUODENOSCOPY (EGD) WITH PROPOFOL N/A 08/22/2022   Procedure: ESOPHAGOGASTRODUODENOSCOPY (EGD) WITH PROPOFOL;  Surgeon: [name], MD;  Location: [facility];  Service: Gastroenterology;  Laterality: N/A;   EYE SURGERY Bilateral    cataracts   POLYPECTOMY  08/22/2022   Procedure: POLYPECTOMY;  Surgeon: [name], MD;   Location: [facility];  Service: Gastroenterology;;   TUBAL LIGATION     Social History   Occupational History   Occupation: [employer]  Tobacco Use   Smoking status: Never   Smokeless tobacco: Never  Vaping Use   Vaping status: Never Used  Substance and Sexual Activity   Alcohol use: No    Alcohol/week: 0.0 standard drinks of alcohol   Drug use: No   Sexual activity: Never

## 2023-04-22 ENCOUNTER — Other Ambulatory Visit (HOSPITAL_COMMUNITY): Payer: Self-pay

## 2023-04-27 DIAGNOSIS — N189 Chronic kidney disease, unspecified: Secondary | ICD-10-CM | POA: Diagnosis not present

## 2023-04-27 DIAGNOSIS — E1169 Type 2 diabetes mellitus with other specified complication: Secondary | ICD-10-CM | POA: Diagnosis not present

## 2023-04-27 DIAGNOSIS — I10 Essential (primary) hypertension: Secondary | ICD-10-CM | POA: Diagnosis not present

## 2023-04-28 ENCOUNTER — Other Ambulatory Visit: Payer: Self-pay

## 2023-04-29 ENCOUNTER — Other Ambulatory Visit (HOSPITAL_COMMUNITY): Payer: Self-pay

## 2023-04-30 ENCOUNTER — Other Ambulatory Visit (HOSPITAL_COMMUNITY): Payer: Self-pay

## 2023-05-01 DIAGNOSIS — G4733 Obstructive sleep apnea (adult) (pediatric): Secondary | ICD-10-CM | POA: Diagnosis not present

## 2023-05-04 DIAGNOSIS — S71102A Unspecified open wound, left thigh, initial encounter: Secondary | ICD-10-CM | POA: Diagnosis not present

## 2023-05-05 ENCOUNTER — Ambulatory Visit: Payer: PPO | Admitting: Neurology

## 2023-05-06 ENCOUNTER — Other Ambulatory Visit (HOSPITAL_COMMUNITY): Payer: Self-pay

## 2023-05-07 HISTORY — PX: CERVICAL SPINE SURGERY: SHX589

## 2023-05-08 ENCOUNTER — Other Ambulatory Visit: Payer: Self-pay

## 2023-05-08 ENCOUNTER — Other Ambulatory Visit (HOSPITAL_COMMUNITY): Payer: Self-pay

## 2023-05-08 MED ORDER — COLCHICINE 0.6 MG PO TABS
0.6000 mg | ORAL_TABLET | Freq: Every day | ORAL | 1 refills | Status: DC
  Filled 2023-05-08: qty 90, 90d supply, fill #0
  Filled 2023-08-07: qty 90, 90d supply, fill #1

## 2023-05-09 ENCOUNTER — Other Ambulatory Visit (HOSPITAL_COMMUNITY): Payer: Self-pay

## 2023-05-11 ENCOUNTER — Other Ambulatory Visit (HOSPITAL_COMMUNITY): Payer: Self-pay

## 2023-05-11 ENCOUNTER — Encounter: Payer: Self-pay | Admitting: Nurse Practitioner

## 2023-05-11 ENCOUNTER — Ambulatory Visit: Payer: PPO | Attending: Nurse Practitioner | Admitting: Nurse Practitioner

## 2023-05-11 VITALS — BP 138/86 | HR 84 | Ht 64.0 in | Wt 236.8 lb

## 2023-05-11 DIAGNOSIS — G4733 Obstructive sleep apnea (adult) (pediatric): Secondary | ICD-10-CM | POA: Diagnosis not present

## 2023-05-11 DIAGNOSIS — R079 Chest pain, unspecified: Secondary | ICD-10-CM

## 2023-05-11 DIAGNOSIS — E119 Type 2 diabetes mellitus without complications: Secondary | ICD-10-CM

## 2023-05-11 DIAGNOSIS — I10 Essential (primary) hypertension: Secondary | ICD-10-CM | POA: Diagnosis not present

## 2023-05-11 MED ORDER — POTASSIUM CHLORIDE CRYS ER 20 MEQ PO TBCR
20.0000 meq | EXTENDED_RELEASE_TABLET | Freq: Every day | ORAL | 3 refills | Status: DC
  Filled 2023-05-11: qty 90, 90d supply, fill #0
  Filled 2023-08-28: qty 90, 90d supply, fill #1
  Filled 2023-11-29: qty 90, 90d supply, fill #2
  Filled 2024-02-13: qty 90, 90d supply, fill #3

## 2023-05-11 MED ORDER — TELMISARTAN 80 MG PO TABS
80.0000 mg | ORAL_TABLET | Freq: Every day | ORAL | 3 refills | Status: DC
  Filled 2023-05-11: qty 90, 90d supply, fill #0
  Filled 2023-08-10: qty 90, 90d supply, fill #1
  Filled 2023-09-13 – 2023-10-18 (×4): qty 90, 90d supply, fill #2
  Filled 2023-12-26: qty 90, 90d supply, fill #3

## 2023-05-11 MED ORDER — EZETIMIBE 10 MG PO TABS
10.0000 mg | ORAL_TABLET | Freq: Every day | ORAL | 3 refills | Status: DC
  Filled 2023-05-11 – 2023-06-15 (×4): qty 90, 90d supply, fill #0
  Filled 2023-08-10 – 2023-09-13 (×3): qty 90, 90d supply, fill #1
  Filled 2023-11-12 – 2023-11-30 (×2): qty 90, 90d supply, fill #2
  Filled 2023-12-26 – 2024-02-11 (×3): qty 90, 90d supply, fill #3

## 2023-05-11 MED ORDER — AMLODIPINE BESYLATE 10 MG PO TABS
10.0000 mg | ORAL_TABLET | Freq: Every day | ORAL | 3 refills | Status: DC
  Filled 2023-05-11 – 2023-06-01 (×3): qty 90, 90d supply, fill #0
  Filled 2023-08-11: qty 90, 90d supply, fill #1
  Filled 2023-11-30: qty 90, 90d supply, fill #2
  Filled 2023-12-26 – 2024-02-11 (×2): qty 90, 90d supply, fill #3

## 2023-05-11 MED ORDER — FUROSEMIDE 40 MG PO TABS
40.0000 mg | ORAL_TABLET | Freq: Every day | ORAL | 3 refills | Status: DC
  Filled 2023-05-11: qty 90, 90d supply, fill #0
  Filled 2023-06-03 – 2023-08-10 (×3): qty 90, 90d supply, fill #1
  Filled 2023-10-02 – 2023-11-12 (×2): qty 90, 90d supply, fill #2
  Filled 2024-02-11: qty 90, 90d supply, fill #3

## 2023-05-11 NOTE — Patient Instructions (Signed)
Medication Instructions:  TAKE Tylenol Arthritis and use heat and cold compresses *If you need a refill on your cardiac medications before your next appointment, please call your pharmacy*   Lab Work: None ordered   Testing/Procedures: Your physician has requested that you have a lexiscan myoview. For further information please visit [email or web address] Please follow instruction sheet, as given.   Follow-Up: At [facility], you and your health needs are our priority.  As part of our continuing mission to provide you with exceptional heart care, we have created designated Provider Care Teams.  These Care Teams include your primary Cardiologist (physician) and Advanced Practice Providers (APPs -  Physician Assistants and Nurse Practitioners) who all work together to provide you with the care you need, when you need it.  We recommend signing up for the patient portal called "MyChart".  Sign up information is provided on this After Visit Summary.  MyChart is used to connect with patients for Virtual Visits (Telemedicine).  Patients are able to view lab/test results, encounter notes, upcoming appointments, etc.  Non-urgent messages can be sent to your provider as well.   To learn more about what you can do with MyChart, go to [email or web address].    Your next appointment:   7 month(s)  Provider:   Lennie Odor   Other Instructions

## 2023-05-11 NOTE — Progress Notes (Signed)
Office Visit    Patient Name: Cathy Gonzalez Date of Encounter: 05/11/2023  Primary Care Provider:  Renaye Rakers, MD Primary Cardiologist:  None Primary Electrophysiologist: None   Past Medical History    Past Medical History:  Diagnosis Date   Arthritis    Cataract    left cataract removed in 2017   Diabetes mellitus    Diabetic neuropathy (HCC)    Diabetic neuropathy, type II diabetes mellitus (HCC)    Hepatitis C    took tx for    Hypertension    Hypokalemia    Knee pain    right    Morbidly obese (HCC)    Nocturnal seizures (HCC) 05/14/2015   no bad seizure since 1980's   OSA on CPAP    uses cpap 2-3 week   Plantar fasciitis    both feet   Past Surgical History:  Procedure Laterality Date   ABDOMINAL HYSTERECTOMY     partial   ANTERIOR CERVICAL DECOMP/DISCECTOMY FUSION N/A 02/24/2022   Procedure: CERVICAL FOUR- CERVICAL FIVE ANTERIOR CERVICAL DECOMPRESSION/DISCECTOMY FUSION ONE LEVEL WITH ALLOGRAFT AND PLATE;  Surgeon: Eldred Manges, MD;  Location: [facility];  Service: Orthopedics;  Laterality: N/A;   CATARACT EXTRACTION Left    COLONOSCOPY WITH PROPOFOL N/A 08/21/2015   Procedure: COLONOSCOPY WITH PROPOFOL;  Surgeon: [name], MD;  Location: [facility];  Service: Endoscopy;  Laterality: N/A;   COLONOSCOPY WITH PROPOFOL N/A 08/22/2022   Procedure: COLONOSCOPY WITH PROPOFOL;  Surgeon: [name], MD;  Location: [facility];  Service: Gastroenterology;  Laterality: N/A;   ESOPHAGOGASTRODUODENOSCOPY (EGD) WITH PROPOFOL N/A 08/21/2015   Procedure: ESOPHAGOGASTRODUODENOSCOPY (EGD) WITH PROPOFOL;  Surgeon: [name], MD;  [facility];  Service: Endoscopy;  Laterality: N/A;   ESOPHAGOGASTRODUODENOSCOPY (EGD) WITH PROPOFOL N/A 10/08/2018   Procedure: ESOPHAGOGASTRODUODENOSCOPY (EGD) WITH PROPOFOL;  Surgeon: [name], MD;  [facility];  Service: Endoscopy;  Laterality: N/A;   ESOPHAGOGASTRODUODENOSCOPY (EGD) WITH PROPOFOL N/A 08/22/2022    Procedure: ESOPHAGOGASTRODUODENOSCOPY (EGD) WITH PROPOFOL;  Surgeon: [name], MD;  [facility];  Service: Gastroenterology;  Laterality: N/A;   EYE SURGERY Bilateral    cataracts   POLYPECTOMY  08/22/2022   Procedure: POLYPECTOMY;  Surgeon: [name], MD;  [facility];  Service: Gastroenterology;;   TUBAL LIGATION      Allergies  Allergies  Allergen Reactions   Acetaminophen     Other reaction(s): GI upset, constipation if taken regularly     History of Present Illness    TRINNITY Gonzalez  is a 77 year old female with a PMH of HTN,  DM type II, OSA (on CPAP), HLD, seizures, and obesity PACs who presents today for complaint of chest pain.  Ms. Armenti was seen initially by Dr. Bufford Buttner in 09/2020 for complaint of palpitations.  For a previous event monitor that showed less than 1% PACs with no malignant arrhythmias.  A 2D echo that showed normal EF of 60 to 65% with no RWMA and grade 1 DD with no extensive valvular abnormalities.  She was last seen by Dr. Bufford Buttner on 12/26/2021 and was doing well from cardiac perspective.  She was having several orthopedic problems such as compression of cervical spine being followed by orthopedics.  She also had complaint of lower extremity pain and was seen by vascular surgery with normal ABIs.   Since last being seen in the office patient reports left-sided chest pain that is reproducible with a deep breath.  She currently works at  [employer] as a Conservation officer, nature and has been doing lots of overhead lifting. She denies any discomfort with activity and blood pressure today is controlled at 138/86.  She is not experienced any palpitations and discomfort is not reproducible on palpation.  She is euvolemic on exam today but does shortness of breath with increased physical activity such as walking.  Patient denies chest pain, palpitations, dyspnea, PND, orthopnea, nausea, vomiting, dizziness, syncope, edema, weight gain, or early satiety.  Home  Medications    Current Outpatient Medications  Medication Sig Dispense Refill   amLODipine (NORVASC) 10 MG tablet Take 5 mg by mouth daily.     colchicine 0.6 MG tablet Take 0.6 mg by mouth at bedtime.     colchicine 0.6 MG tablet Take 1 tablet (0.6 mg total) by mouth daily. 90 tablet 1   diclofenac Sodium (VOLTAREN) 1 % GEL Apply 2 g topically in the morning and at bedtime.     diclofenac Sodium (VOLTAREN) 1 % GEL Apply 2 inches to the affected area 4 times a day 500 g 5   empagliflozin (JARDIANCE) 10 MG TABS tablet Take 1 tablet (10 mg total) by mouth daily. 90 tablet 1   Evolocumab (REPATHA SURECLICK) 140 MG/ML SOAJ Inject 140 mg into the skin every 14 days. 2 mL 2   Evolocumab (REPATHA SURECLICK) 140 MG/ML SOAJ Inject 140 mg into the skin every 14 (fourteen) days. 4 mL 2   ezetimibe (ZETIA) 10 MG tablet Take 10 mg by mouth at bedtime.      ezetimibe (ZETIA) 10 MG tablet Take 1 tablet (10 mg total) by mouth daily for cholesterol. 90 tablet 1   furosemide (LASIX) 40 MG tablet Take 40 mg by mouth at bedtime.     glucose blood test strip Use as directed to test blood sugar 4 times daily 100 each 6   levETIRAcetam (KEPPRA) 1000 MG tablet Take 1 1/2 tablet in the morning and 2 tablets at bedtime 320 tablet 3   metFORMIN (GLUCOPHAGE) 500 MG tablet Take 1 tablet (500 mg total) by mouth every day at night. 90 tablet 0   metFORMIN (GLUCOPHAGE) 500 MG tablet Take 1 tablet (500 mg) by mouth every evening as directed 90 tablet 1   montelukast (SINGULAIR) 10 MG tablet Take 10 mg by mouth at bedtime.     mupirocin ointment (BACTROBAN) 2 % Apply 1 topically to affected wound(s) 2 (two) times daily for 2 weeks 22 g 0   ONETOUCH DELICA LANCETS 33G MISC   3   potassium chloride SA (KLOR-CON M) 20 MEQ tablet Take 1 tablet (20 mEq total) by mouth daily. 90 tablet 0   pregabalin (LYRICA) 300 MG capsule Take 1 capsule (300 mg total) by mouth 2 (two) times daily. 180 capsule 1   sucralfate (CARAFATE) 1 G tablet  Take 1 g by mouth in the morning, at noon, and at bedtime.     telmisartan (MICARDIS) 80 MG tablet Take 80 mg by mouth daily.   1   No current facility-administered medications for this visit.     Review of Systems  Please see the history of present illness.    (+) Sided chest discomfort (+) Shortness of breath with activity  All other systems reviewed and are otherwise negative except as noted above.  Physical Exam    Wt Readings from Last 3 Encounters:  04/21/23 230 lb (104.3 kg)  03/23/23 230 lb (104.3 kg)  03/16/23 243 lb (110.2 kg)   ZO:XWRUE were no vitals  filed for this visit.,There is no height or weight on file to calculate BMI.  Constitutional:      Appearance: Healthy appearance. Not in distress.  Neck:     Vascular: JVD normal.  Pulmonary:     Effort: Pulmonary effort is normal.     Breath sounds: No wheezing. No rales. Diminished in the bases Cardiovascular:     Normal rate. Regular rhythm. Normal S1. Normal S2.      Murmurs: There is no murmur.  Edema:    Peripheral edema absent.  Abdominal:     Palpations: Abdomen is soft non tender. There is no hepatomegaly.  Skin:    General: Skin is warm and dry.  Neurological:     General: No focal deficit present.     Mental Status: Alert and oriented to person, place and time.     Cranial Nerves: Cranial nerves are intact.  EKG/LABS/ Recent Cardiac Studies    ECG personally reviewed by me today -completed today   Lab Results  Component Value Date   WBC 6.3 12/30/2022   HGB 14.7 12/30/2022   HCT 44.7 12/30/2022   MCV 90.3 12/30/2022   PLT 303 12/30/2022   Lab Results  Component Value Date   CREATININE 0.96 12/30/2022   BUN 18 12/30/2022   NA 140 12/30/2022   K 3.1 (L) 12/30/2022   CL 101 12/30/2022   CO2 26 12/30/2022   Lab Results  Component Value Date   ALT 55 (H) 12/30/2022   AST 46 (H) 12/30/2022   ALKPHOS 72 12/30/2022   BILITOT 0.6 12/30/2022   Lab Results  Component Value Date   CHOL  216 (H) 02/11/2014   HDL 65 02/11/2014   LDLCALC 133 (H) 02/11/2014   TRIG 92 02/11/2014   CHOLHDL 3.3 02/11/2014    Lab Results  Component Value Date   HGBA1C 6.1 (H) 02/17/2022     Assessment & Plan    1.  Chest pain: -Patient reports left-sided chest discomfort that occurs with deep breaths and when reaching across her body. -Due to increased risk factors such as diabetes and hypertension patient will complete Lexiscan Myoview to rule out coronary disease -Patient advised to try over-the-counter Tylenol arthritis and also use heat and ice alternating -She was advised to go to the ED if chest discomfort recurs and is not relieved with rest or as needed acetaminophen.   2.  Essential hypertension: -Patient's blood pressure today was well-controlled at 138/86 -Continue Micardis 80 mg daily -Continue low-sodium heart healthy diet  3.  Hyperlipidemia: -Patient's cholesterol was at goal of 50 -Continue Zetia 10 mg daily and Repatha 140 mg q. 14 days  4.  DM type II: -Patient's hemoglobin A1c was 6.7 -Continue current treatment plan per PCP  5.  Obstructive sleep apnea: -Patient's reports compliance with CPAP nightly  Disposition: Follow-up with None or APP in 8 months Informed Consent   Shared Decision Making/Informed Consent The risks [chest pain, shortness of breath, cardiac arrhythmias, dizziness, blood pressure fluctuations, myocardial infarction, stroke/transient ischemic attack, nausea, vomiting, allergic reaction, radiation exposure, metallic taste sensation and life-threatening complications (estimated to be 1 in 10,000)], benefits (risk stratification, diagnosing coronary artery disease, treatment guidance) and alternatives of a nuclear stress test were discussed in detail with Ms. Danzy and she agrees to proceed.      Medication Adjustments/Labs and Tests Ordered: Current medicines are reviewed at length with the patient today.  Concerns regarding medicines are  outlined above.   Signed, Napoleon Form,  Leodis Rains, NP 05/11/2023, 10:20 AM [facility]

## 2023-05-12 ENCOUNTER — Other Ambulatory Visit (HOSPITAL_COMMUNITY): Payer: Self-pay

## 2023-05-15 ENCOUNTER — Other Ambulatory Visit (HOSPITAL_COMMUNITY): Payer: Self-pay

## 2023-05-16 ENCOUNTER — Other Ambulatory Visit (HOSPITAL_COMMUNITY): Payer: Self-pay

## 2023-05-17 ENCOUNTER — Other Ambulatory Visit: Payer: Self-pay

## 2023-05-18 ENCOUNTER — Ambulatory Visit
Admission: RE | Admit: 2023-05-18 | Discharge: 2023-05-18 | Disposition: A | Discharge status: Home / Self Care | Payer: PPO | Source: Ambulatory Visit | Attending: Family Medicine | Admitting: Family Medicine

## 2023-05-18 DIAGNOSIS — Z1231 Encounter for screening mammogram for malignant neoplasm of breast: Secondary | ICD-10-CM

## 2023-05-20 ENCOUNTER — Other Ambulatory Visit (HOSPITAL_COMMUNITY): Payer: Self-pay

## 2023-05-21 ENCOUNTER — Telehealth (HOSPITAL_COMMUNITY): Payer: Self-pay | Admitting: *Deleted

## 2023-05-21 NOTE — Telephone Encounter (Signed)
Patient given detailed instructions per Myocardial Perfusion Study Information Sheet for the test on 05/25/2023 at 8:15. Patient notified to arrive 15 minutes early and that it is imperative to arrive on time for appointment to keep from having the test rescheduled.  If you need to cancel or reschedule your appointment, please call the office within 24 hours of your appointment. . Patient verbalized understanding.Cathy Gonzalez

## 2023-05-25 ENCOUNTER — Ambulatory Visit (HOSPITAL_COMMUNITY): Payer: PPO | Attending: Nurse Practitioner

## 2023-05-25 DIAGNOSIS — E119 Type 2 diabetes mellitus without complications: Secondary | ICD-10-CM | POA: Insufficient documentation

## 2023-05-25 DIAGNOSIS — R079 Chest pain, unspecified: Secondary | ICD-10-CM | POA: Diagnosis not present

## 2023-05-25 DIAGNOSIS — I10 Essential (primary) hypertension: Secondary | ICD-10-CM | POA: Insufficient documentation

## 2023-05-25 DIAGNOSIS — G4733 Obstructive sleep apnea (adult) (pediatric): Secondary | ICD-10-CM | POA: Insufficient documentation

## 2023-05-25 MED ORDER — REGADENOSON 0.4 MG/5ML IV SOLN
0.4000 mg | Freq: Once | INTRAVENOUS | Status: AC
  Administered 2023-05-25: 0.4 mg via INTRAVENOUS

## 2023-05-25 MED ORDER — TECHNETIUM TC 99M TETROFOSMIN IV KIT
32.4000 | PACK | Freq: Once | INTRAVENOUS | Status: AC | PRN
  Administered 2023-05-25: 32.4 via INTRAVENOUS

## 2023-05-26 ENCOUNTER — Ambulatory Visit (HOSPITAL_COMMUNITY): Payer: PPO | Attending: Cardiovascular Disease

## 2023-05-26 LAB — MYOCARDIAL PERFUSION IMAGING
LV dias vol: 76 mL (ref 46–106)
LV sys vol: 23 mL
Nuc Stress EF: 70 %
Peak HR: 91 {beats}/min
Rest HR: 75 {beats}/min
Rest Nuclear Isotope Dose: 32.6 mCi
SDS: 1
SRS: 5
SSS: 6
ST Depression (mm): 0 mm
Stress Nuclear Isotope Dose: 32.4 mCi
TID: 1.05

## 2023-05-26 MED ORDER — TECHNETIUM TC 99M TETROFOSMIN IV KIT
32.6000 | PACK | Freq: Once | INTRAVENOUS | Status: AC | PRN
  Administered 2023-05-26: 32.6 via INTRAVENOUS

## 2023-06-01 ENCOUNTER — Other Ambulatory Visit (HOSPITAL_COMMUNITY): Payer: Self-pay

## 2023-06-03 ENCOUNTER — Other Ambulatory Visit (HOSPITAL_BASED_OUTPATIENT_CLINIC_OR_DEPARTMENT_OTHER): Payer: Self-pay

## 2023-06-03 ENCOUNTER — Other Ambulatory Visit (HOSPITAL_COMMUNITY): Payer: Self-pay

## 2023-06-04 ENCOUNTER — Other Ambulatory Visit (HOSPITAL_COMMUNITY): Payer: Self-pay

## 2023-06-06 ENCOUNTER — Other Ambulatory Visit (HOSPITAL_COMMUNITY): Payer: Self-pay

## 2023-06-09 ENCOUNTER — Other Ambulatory Visit (HOSPITAL_COMMUNITY): Payer: Self-pay

## 2023-06-09 MED ORDER — GLUCOSE BLOOD VI STRP
ORAL_STRIP | 6 refills | Status: DC
  Filled 2023-06-09: qty 100, 25d supply, fill #0
  Filled 2023-06-30: qty 100, 25d supply, fill #1
  Filled 2023-07-27: qty 100, 25d supply, fill #2
  Filled 2023-08-21 – 2023-09-01 (×2): qty 100, 25d supply, fill #3
  Filled 2023-09-21: qty 100, 25d supply, fill #4
  Filled 2023-10-18: qty 100, 25d supply, fill #5
  Filled 2023-11-16: qty 100, 25d supply, fill #6

## 2023-06-10 ENCOUNTER — Other Ambulatory Visit (HOSPITAL_COMMUNITY): Payer: Self-pay

## 2023-06-15 ENCOUNTER — Other Ambulatory Visit: Payer: Self-pay

## 2023-06-15 ENCOUNTER — Other Ambulatory Visit (HOSPITAL_COMMUNITY): Payer: Self-pay

## 2023-06-18 ENCOUNTER — Other Ambulatory Visit (HOSPITAL_COMMUNITY): Payer: Self-pay

## 2023-06-27 ENCOUNTER — Other Ambulatory Visit: Payer: Self-pay | Admitting: Neurology

## 2023-06-29 ENCOUNTER — Other Ambulatory Visit (HOSPITAL_COMMUNITY): Payer: Self-pay

## 2023-06-29 ENCOUNTER — Ambulatory Visit: Payer: PPO | Admitting: Podiatry

## 2023-06-29 ENCOUNTER — Other Ambulatory Visit: Payer: Self-pay

## 2023-06-29 DIAGNOSIS — G40001 Localization-related (focal) (partial) idiopathic epilepsy and epileptic syndromes with seizures of localized onset, not intractable, with status epilepticus: Secondary | ICD-10-CM | POA: Diagnosis not present

## 2023-06-29 DIAGNOSIS — M130 Polyarthritis, unspecified: Secondary | ICD-10-CM | POA: Diagnosis not present

## 2023-06-29 DIAGNOSIS — I10 Essential (primary) hypertension: Secondary | ICD-10-CM | POA: Diagnosis not present

## 2023-06-29 DIAGNOSIS — E1169 Type 2 diabetes mellitus with other specified complication: Secondary | ICD-10-CM | POA: Diagnosis not present

## 2023-06-29 MED ORDER — LEVETIRACETAM 1000 MG PO TABS
1000.0000 mg | ORAL_TABLET | ORAL | 3 refills | Status: DC
  Filled 2023-06-29: qty 315, 90d supply, fill #0
  Filled 2023-07-11: qty 320, 90d supply, fill #0
  Filled 2023-07-13: qty 320, fill #0
  Filled 2023-07-27: qty 320, 91d supply, fill #0
  Filled 2023-10-02: qty 320, 91d supply, fill #1
  Filled 2024-01-25: qty 80, 20d supply, fill #2
  Filled 2024-01-26: qty 240, 71d supply, fill #2

## 2023-06-29 MED ORDER — EMPAGLIFLOZIN 25 MG PO TABS
25.0000 mg | ORAL_TABLET | Freq: Every day | ORAL | 1 refills | Status: DC
  Filled 2023-06-29: qty 90, 90d supply, fill #0
  Filled 2023-07-13: qty 30, 30d supply, fill #0
  Filled 2023-08-15 (×2): qty 30, 30d supply, fill #1
  Filled 2023-09-17: qty 30, 30d supply, fill #2
  Filled 2023-10-16: qty 30, 30d supply, fill #3
  Filled 2023-11-12: qty 30, 30d supply, fill #4
  Filled 2023-12-13: qty 30, 30d supply, fill #5

## 2023-06-30 ENCOUNTER — Other Ambulatory Visit (HOSPITAL_COMMUNITY): Payer: Self-pay

## 2023-07-01 ENCOUNTER — Ambulatory Visit: Payer: PPO | Admitting: Podiatry

## 2023-07-02 ENCOUNTER — Telehealth: Payer: Self-pay | Admitting: Podiatry

## 2023-07-02 NOTE — Telephone Encounter (Signed)
Pt called checking up in diabetic shoes she was measured for on 7/11. Has an appt coming up in Oct and would like to pick them up then.

## 2023-07-04 ENCOUNTER — Other Ambulatory Visit (HOSPITAL_COMMUNITY): Payer: Self-pay

## 2023-07-08 ENCOUNTER — Encounter: Payer: Self-pay | Admitting: Internal Medicine

## 2023-07-08 ENCOUNTER — Ambulatory Visit: Payer: HMO | Admitting: Internal Medicine

## 2023-07-08 ENCOUNTER — Ambulatory Visit: Payer: PPO | Admitting: Internal Medicine

## 2023-07-08 ENCOUNTER — Other Ambulatory Visit: Payer: Self-pay

## 2023-07-08 VITALS — BP 138/84 | HR 85 | Temp 98.1°F | Wt 238.0 lb

## 2023-07-08 DIAGNOSIS — K7460 Unspecified cirrhosis of liver: Secondary | ICD-10-CM

## 2023-07-10 ENCOUNTER — Other Ambulatory Visit (HOSPITAL_COMMUNITY): Payer: Self-pay

## 2023-07-10 ENCOUNTER — Encounter: Payer: Self-pay | Admitting: Internal Medicine

## 2023-07-10 NOTE — Assessment & Plan Note (Signed)
I reviewed the ongoing plan with her and will continue with surveillance for [facility] Will schedule ongoing ultrasounds and follow up in 1 year  I have personally spent 32 minutes involved in face-to-face and non-face-to-face activities for this patient on the day of the visit. Professional time spent includes the following activities: Preparing to see the patient (review of tests), Obtaining and/or reviewing separately obtained history (admission/discharge record), Performing a medically appropriate examination and/or evaluation , Ordering medications/tests/procedures, referring and communicating with other health care professionals, Documenting clinical information in the EMR, Independently interpreting results (not separately reported), Communicating results to the patient/family/caregiver, Counseling and educating the patient/family/caregiver and Care coordination (not separately reported).

## 2023-07-10 NOTE — Progress Notes (Signed)
Subjective:    Patient ID: Cathy Gonzalez, female    DOB: [birth year], 77 y.o.   MRN: [id number]  HPI Cathy Gonzalez is here for follow up of compensated cirrhosis and HCC screening She has a history of chronic hepatitis C s/p successful treatment and also a history of compensated cirhosis and on surveillance for [facility].   Ultrasounds to date have been without concerns.      Review of Systems  Constitutional:  Negative for fatigue.  Gastrointestinal:  Negative for nausea.  Skin:  Negative for rash.       Objective:   Physical Exam Eyes:     General: No scleral icterus. Pulmonary:     Effort: Pulmonary effort is normal.  Neurological:     Mental Status: She is alert.           Assessment & Plan:

## 2023-07-11 ENCOUNTER — Other Ambulatory Visit (HOSPITAL_COMMUNITY): Payer: Self-pay

## 2023-07-13 ENCOUNTER — Other Ambulatory Visit (HOSPITAL_COMMUNITY): Payer: Self-pay

## 2023-07-13 ENCOUNTER — Ambulatory Visit (INDEPENDENT_AMBULATORY_CARE_PROVIDER_SITE_OTHER): Payer: PPO | Admitting: Podiatry

## 2023-07-13 DIAGNOSIS — M79675 Pain in left toe(s): Secondary | ICD-10-CM | POA: Diagnosis not present

## 2023-07-13 DIAGNOSIS — E1142 Type 2 diabetes mellitus with diabetic polyneuropathy: Secondary | ICD-10-CM | POA: Diagnosis not present

## 2023-07-13 DIAGNOSIS — M79674 Pain in right toe(s): Secondary | ICD-10-CM

## 2023-07-13 DIAGNOSIS — M19071 Primary osteoarthritis, right ankle and foot: Secondary | ICD-10-CM | POA: Diagnosis not present

## 2023-07-13 DIAGNOSIS — B351 Tinea unguium: Secondary | ICD-10-CM | POA: Diagnosis not present

## 2023-07-13 MED ORDER — DICLOFENAC SODIUM 1 % EX GEL
CUTANEOUS | 5 refills | Status: DC
  Filled 2023-07-13: qty 500, 90d supply, fill #0
  Filled 2023-07-27: qty 400, 100d supply, fill #0
  Filled 2023-08-07: qty 500, 30d supply, fill #0

## 2023-07-13 NOTE — Progress Notes (Signed)
Subjective:  Patient ID: Cathy Gonzalez, female    DOB: 11/21/1945,  MRN: [id number]  Cathy Gonzalez presents to clinic today for:  Chief Complaint  Patient presents with   Nail Problem    Diabetic nail trim   Patient notes nails are thick, discolored, elongated and painful in shoegear when trying to ambulate.    PCP is Renaye Rakers, MD.  Past Medical History:  Diagnosis Date   Arthritis    Cataract    left cataract removed in 2017   Diabetes mellitus    Diabetic neuropathy (HCC)    Diabetic neuropathy, type II diabetes mellitus (HCC)    Hepatitis C    took tx for    Hypertension    Hypokalemia    Knee pain    right    Morbidly obese (HCC)    Nocturnal seizures (HCC) 05/14/2015   no bad seizure since 1980's   OSA on CPAP    uses cpap 2-3 week   Plantar fasciitis    both feet    Past Surgical History:  Procedure Laterality Date   ABDOMINAL HYSTERECTOMY     partial   ANTERIOR CERVICAL DECOMP/DISCECTOMY FUSION N/A 02/24/2022   Procedure: CERVICAL FOUR- CERVICAL FIVE ANTERIOR CERVICAL DECOMPRESSION/DISCECTOMY FUSION ONE LEVEL WITH ALLOGRAFT AND PLATE;  Surgeon: Eldred Manges, MD;  Location: [facility];  Service: Orthopedics;  Laterality: N/A;   CATARACT EXTRACTION Left    COLONOSCOPY WITH PROPOFOL N/A 08/21/2015   Procedure: COLONOSCOPY WITH PROPOFOL;  Surgeon: [name], MD;  [facility]: [facility];  Service: Endoscopy;  Laterality: N/A;   COLONOSCOPY WITH PROPOFOL N/A 08/22/2022   Procedure: COLONOSCOPY WITH PROPOFOL;  Surgeon: [name], MD;  Location: [facility];  Service: Gastroenterology;  Laterality: N/A;   ESOPHAGOGASTRODUODENOSCOPY (EGD) WITH PROPOFOL N/A 08/21/2015   Procedure: ESOPHAGOGASTRODUODENOSCOPY (EGD) WITH PROPOFOL;  Surgeon: [name], MD;  [facility];  Service: Endoscopy;  Laterality: N/A;   ESOPHAGOGASTRODUODENOSCOPY (EGD) WITH PROPOFOL N/A 10/08/2018   Procedure: ESOPHAGOGASTRODUODENOSCOPY (EGD) WITH PROPOFOL;  Surgeon: [name], MD;  [facility];  Service: Endoscopy;  Laterality: N/A;   ESOPHAGOGASTRODUODENOSCOPY (EGD) WITH PROPOFOL N/A 08/22/2022   Procedure: ESOPHAGOGASTRODUODENOSCOPY (EGD) WITH PROPOFOL;  Surgeon: [name], MD;  [facility]: [facility];  Service: Gastroenterology;  Laterality: N/A;   EYE SURGERY Bilateral    cataracts   POLYPECTOMY  08/22/2022   Procedure: POLYPECTOMY;  Surgeon: [name], MD;  [facility];  Service: Gastroenterology;;   TUBAL LIGATION      Allergies  Allergen Reactions   Acetaminophen     Other reaction(s): GI upset, constipation if taken regularly    Review of Systems: Negative except as noted in the HPI.  Objective:  Cathy Gonzalez is a pleasant 77 y.o. female in NAD. AAO x 3.  Vascular Examination: Capillary refill time is 3-5 seconds to toes bilateral. Palpable pedal pulses b/l LE. Digital hair present b/l.  Skin temperature gradient WNL b/l. No varicosities b/l. No cyanosis noted b/l.   Dermatological Examination: Pedal skin with normal turgor, texture and tone b/l. No open wounds. No interdigital macerations b/l. Toenails x9 are 3mm thick, discolored, dystrophic with subungual debris. There is pain with compression of the nail plates.  They are elongated x 9.  Left hallux nail previously removed.   Neurological Examination: Protective sensation intact diminished LE.   Assessment/Plan: 1. Diabetic peripheral neuropathy associated with type 2 diabetes mellitus (HCC)   2. Pain due to onychomycosis of  toenails of both feet   3. Osteoarthritis of right ankle and foot     Meds ordered this encounter  Medications   diclofenac Sodium (VOLTAREN) 1 % GEL    Sig: Apply 2 inches to the affected area 4 times a day    Dispense:  500 g    Refill:  5   The mycotic toenails were sharply debrided x10 with sterile nail nippers and a power debriding burr to decrease bulk/thickness and length.    Patient is requesting diclofenac be sent  in as a prescription today to see if her insurance will cover this.  Patient was informed this is over-the-counter but we will see how her insurance processes.  Now this is for overall foot pain/arthritis/neuropathy.  She states that it has helped tremendously in the past for her foot neuropathy bilateral.  Since patient states she has not heard from [place] regarding her diabetic shoes since July, most likely the paperwork has now expired.  Will put a new order in for diabetic shoes and inserts and make an appointment with our pedorthist for diabetic shoe consult  Return in about 3 months (around 10/13/2023) for [facility].   Clerance Lav, DPM, FACFAS [facility]     [address] [place], [place] [address]                Office [phone]  Fax [phone]

## 2023-07-14 ENCOUNTER — Ambulatory Visit (INDEPENDENT_AMBULATORY_CARE_PROVIDER_SITE_OTHER): Payer: PPO | Admitting: Orthopaedic Surgery

## 2023-07-14 ENCOUNTER — Other Ambulatory Visit: Payer: Self-pay

## 2023-07-14 ENCOUNTER — Encounter: Payer: Self-pay | Admitting: Orthopaedic Surgery

## 2023-07-14 VITALS — BP 132/82 | HR 81

## 2023-07-14 DIAGNOSIS — M7541 Impingement syndrome of right shoulder: Secondary | ICD-10-CM | POA: Diagnosis not present

## 2023-07-14 DIAGNOSIS — G8929 Other chronic pain: Secondary | ICD-10-CM

## 2023-07-14 DIAGNOSIS — M25511 Pain in right shoulder: Secondary | ICD-10-CM | POA: Diagnosis not present

## 2023-07-14 MED ORDER — METHYLPREDNISOLONE ACETATE 40 MG/ML IJ SUSP
40.0000 mg | INTRAMUSCULAR | Status: AC | PRN
Start: 2023-07-14 — End: 2023-07-14
  Administered 2023-07-14: 40 mg via INTRA_ARTICULAR

## 2023-07-14 MED ORDER — BUPIVACAINE HCL 0.25 % IJ SOLN
4.0000 mL | INTRAMUSCULAR | Status: AC | PRN
Start: 2023-07-14 — End: 2023-07-14
  Administered 2023-07-14: 4 mL via INTRA_ARTICULAR

## 2023-07-14 MED ORDER — LIDOCAINE HCL 1 % IJ SOLN
0.5000 mL | INTRAMUSCULAR | Status: AC | PRN
Start: 2023-07-14 — End: 2023-07-14
  Administered 2023-07-14: .5 mL

## 2023-07-14 NOTE — Progress Notes (Signed)
Office Visit Note   Patient: Cathy Gonzalez           Date of Birth: [birth year]           MRN: [id number] Visit Date: 07/14/2023              Requested by: Renaye Rakers, MD [address] [place],  [place] [address] PCP: Renaye Rakers, MD   Assessment & Plan: Visit Diagnoses:  1. Chronic right shoulder pain   2. Impingement syndrome of right shoulder     Plan: Right shoulder injection performed for impingement.  She will let us know if she does not get expected improvement.  Follow-Up Instructions: No follow-ups on file.   Orders:  Orders Placed This Encounter  Procedures   XR Shoulder Right   No orders of the defined types were placed in this encounter.     Procedures: Large Joint Inj: R subacromial bursa on 07/14/2023 11:47 AM Indications: pain Details: 22 G 1.5 in needle, lateral approach  Arthrogram: No  Medications: 4 mL bupivacaine 0.25 %; 40 mg methylPREDNISolone acetate 40 MG/ML; 0.5 mL lidocaine 1 % Outcome: tolerated well, no immediate complications Procedure, treatment alternatives, risks and benefits explained, specific risks discussed. Consent was given by the patient. Immediately prior to procedure a time out was called to verify the correct patient, procedure, equipment, support staff and site/side marked as required. Patient was prepped and draped in the usual sterile fashion.       Clinical Data: No additional findings.   Subjective: Chief Complaint  Patient presents with   Right Shoulder - Pain    HPI 77 year old female returns with problems with right shoulder at senior 2023 with some impingement.  A1c is below 7.  She did have pain with outstretched reaching.  Neck is doing well also doing well from a back standpoint.  Review of Systems all systems have been noncontributory.   Objective: Vital Signs: BP 132/82 (BP Location: Right Arm, Patient Position: Sitting, Cuff Size: Large)   Pulse 81   Physical Exam Constitutional:       Appearance: She is well-developed.  HENT:     Head: Normocephalic.     Right Ear: External ear normal.     Left Ear: External ear normal. There is no impacted cerumen.  Eyes:     Pupils: Pupils are equal, round, and reactive to light.  Neck:     Thyroid: No thyromegaly.     Trachea: No tracheal deviation.  Cardiovascular:     Rate and Rhythm: Normal rate.  Pulmonary:     Effort: Pulmonary effort is normal.  Abdominal:     Palpations: Abdomen is soft.  Musculoskeletal:     Cervical back: No rigidity.  Skin:    General: Skin is warm and dry.  Neurological:     Mental Status: She is alert and oriented to person, place, and time.  Psychiatric:        Behavior: Behavior normal.     Ortho Exam good cervical range of motion well-healed anterior cervical incision positive impingement right shoulder negative impingement left shoulder.  Subscap external rotation is strong.  Long head of the biceps minimally tender elbow reaches full extension.  Normal grip.  Pain with the abduction and overhead reaching.  Specialty Comments:  No specialty comments available.  Imaging: No results found.   PMFS History: Patient Active Problem List   Diagnosis Date Noted   Gouty neuritis (HCC) 03/16/2023   Paroxysmal A-fib (  HCC) 03/16/2023   Pain in right hand 10/28/2022   Impingement syndrome of right shoulder 08/12/2022   S/P cervical spinal fusion 06/25/2022   Pain due to onychomycosis of toenails of both feet 05/06/2022   Cervical stenosis of spine 02/24/2022   Neck pain 12/03/2021   Abnormal liver function tests 02/12/2021   Diarrhea 02/12/2021   Gastro-esophageal reflux disease without esophagitis 02/12/2021   Periumbilical pain 02/12/2021   History of colonic polyps 02/12/2021   Portal hypertension (HCC) 02/12/2021   Screening for malignant neoplasm of colon 02/12/2021   Type 2 diabetes mellitus without complications (HCC) 02/12/2021   Primary osteoarthritis, left hand 11/07/2020    Arthritis of carpometacarpal (CMC) joint of thumb 07/18/2020   Bilateral primary osteoarthritis of knee 06/20/2020   Bilateral primary osteoarthritis of hip 06/07/2020   Right thigh pain 05/09/2020   Low back pain 07/14/2019   OSA on CPAP 05/16/2019   Diabetic neuropathy associated with type 2 diabetes mellitus (HCC) 12/13/2018   Chronic pain of right knee 12/09/2018   Thumb pain, left 12/09/2018   Dizziness and giddiness 01/14/2016   Hepatic cirrhosis (HCC) 07/26/2015   Nocturnal seizures (HCC) 05/14/2015   Chronic hepatitis C without hepatic coma (HCC) 03/14/2015   Vertigo, central 02/11/2014   Difficulty walking 02/11/2014   Diabetes mellitus (HCC) 02/11/2014   Dysphagia 02/11/2014   Class 3 severe obesity with serious comorbidity and body mass index (BMI) of 40.0 to 44.9 in adult, unspecified obesity type (HCC) 07/26/2010   Essential hypertension, benign 07/26/2010   DEGENERATIVE JOINT DISEASE, GENERALIZED 07/26/2010   Past Medical History:  Diagnosis Date   Arthritis    Cataract    left cataract removed in 2017   Diabetes mellitus    Diabetic neuropathy (HCC)    Diabetic neuropathy, type II diabetes mellitus (HCC)    Hepatitis C    took tx for    Hypertension    Hypokalemia    Knee pain    right    Morbidly obese (HCC)    Nocturnal seizures (HCC) 05/14/2015   no bad seizure since 1980's   OSA on CPAP    uses cpap 2-3 week   Plantar fasciitis    both feet    Family History  Problem Relation Age of Onset   Diabetes Mother    Heart attack Mother    Diabetes Sister    Diabetes Brother    Cancer Sister        breast   Breast cancer Sister    Seizures Neg Hx     Past Surgical History:  Procedure Laterality Date   ABDOMINAL HYSTERECTOMY     partial   ANTERIOR CERVICAL DECOMP/DISCECTOMY FUSION N/A 02/24/2022   Procedure: CERVICAL FOUR- CERVICAL FIVE ANTERIOR CERVICAL DECOMPRESSION/DISCECTOMY FUSION ONE LEVEL WITH ALLOGRAFT AND PLATE;  Surgeon: Eldred Manges, MD;   Location: [facility];  Service: Orthopedics;  Laterality: N/A;   CATARACT EXTRACTION Left    COLONOSCOPY WITH PROPOFOL N/A 08/21/2015   Procedure: COLONOSCOPY WITH PROPOFOL;  Surgeon: [name], MD;  Location: [facility];  Service: Endoscopy;  Laterality: N/A;   COLONOSCOPY WITH PROPOFOL N/A 08/22/2022   Procedure: COLONOSCOPY WITH PROPOFOL;  Surgeon: [name], MD;  Location: [facility];  Service: Gastroenterology;  Laterality: N/A;   ESOPHAGOGASTRODUODENOSCOPY (EGD) WITH PROPOFOL N/A 08/21/2015   Procedure: ESOPHAGOGASTRODUODENOSCOPY (EGD) WITH PROPOFOL;  Surgeon: [name], MD;  [facility];  Service: Endoscopy;  Laterality: N/A;   ESOPHAGOGASTRODUODENOSCOPY (EGD) WITH PROPOFOL N/A 10/08/2018   Procedure:  ESOPHAGOGASTRODUODENOSCOPY (EGD) WITH PROPOFOL;  Surgeon: [name], MD;  [facility];  Service: Endoscopy;  Laterality: N/A;   ESOPHAGOGASTRODUODENOSCOPY (EGD) WITH PROPOFOL N/A 08/22/2022   Procedure: ESOPHAGOGASTRODUODENOSCOPY (EGD) WITH PROPOFOL;  Surgeon: [name], MD;  [facility];  Service: Gastroenterology;  Laterality: N/A;   EYE SURGERY Bilateral    cataracts   POLYPECTOMY  08/22/2022   Procedure: POLYPECTOMY;  Surgeon: [name], MD;  Location: [facility];  Service: Gastroenterology;;   TUBAL LIGATION     Social History   Occupational History   Occupation: [employer]  Tobacco Use   Smoking status: Never   Smokeless tobacco: Never  Vaping Use   Vaping status: Never Used  Substance and Sexual Activity   Alcohol use: No    Alcohol/week: 0.0 standard drinks of alcohol   Drug use: No   Sexual activity: Never

## 2023-07-17 ENCOUNTER — Other Ambulatory Visit (HOSPITAL_COMMUNITY): Payer: Self-pay

## 2023-07-17 ENCOUNTER — Other Ambulatory Visit: Payer: PPO

## 2023-07-17 DIAGNOSIS — G4733 Obstructive sleep apnea (adult) (pediatric): Secondary | ICD-10-CM | POA: Diagnosis not present

## 2023-07-20 ENCOUNTER — Ambulatory Visit
Admission: RE | Admit: 2023-07-20 | Discharge: 2023-07-20 | Disposition: A | Discharge status: Home / Self Care | Payer: PPO | Source: Ambulatory Visit | Attending: Internal Medicine | Admitting: Internal Medicine

## 2023-07-20 DIAGNOSIS — K7460 Unspecified cirrhosis of liver: Secondary | ICD-10-CM

## 2023-07-20 DIAGNOSIS — Z1289 Encounter for screening for malignant neoplasm of other sites: Secondary | ICD-10-CM | POA: Diagnosis not present

## 2023-07-27 ENCOUNTER — Other Ambulatory Visit (HOSPITAL_COMMUNITY): Payer: Self-pay

## 2023-07-27 ENCOUNTER — Other Ambulatory Visit: Payer: Self-pay

## 2023-07-28 ENCOUNTER — Other Ambulatory Visit (HOSPITAL_COMMUNITY): Payer: Self-pay

## 2023-07-28 ENCOUNTER — Other Ambulatory Visit (HOSPITAL_BASED_OUTPATIENT_CLINIC_OR_DEPARTMENT_OTHER): Payer: Self-pay

## 2023-07-29 ENCOUNTER — Other Ambulatory Visit (HOSPITAL_COMMUNITY): Payer: Self-pay

## 2023-07-30 ENCOUNTER — Other Ambulatory Visit: Payer: Self-pay

## 2023-07-30 ENCOUNTER — Other Ambulatory Visit (HOSPITAL_COMMUNITY): Payer: Self-pay

## 2023-07-31 ENCOUNTER — Other Ambulatory Visit (HOSPITAL_COMMUNITY): Payer: Self-pay

## 2023-08-01 ENCOUNTER — Other Ambulatory Visit (HOSPITAL_COMMUNITY): Payer: Self-pay

## 2023-08-01 MED ORDER — PREGABALIN 300 MG PO CAPS
300.0000 mg | ORAL_CAPSULE | Freq: Two times a day (BID) | ORAL | 1 refills | Status: DC
Start: 2023-07-31 — End: 2024-04-20
  Filled 2023-08-01 – 2023-10-27 (×6): qty 180, 90d supply, fill #0
  Filled 2024-01-21: qty 180, 90d supply, fill #1
  Filled ????-??-??: fill #0

## 2023-08-03 ENCOUNTER — Other Ambulatory Visit: Payer: PPO

## 2023-08-03 NOTE — Progress Notes (Addendum)
Checked with [employer] re: order that was placed in July  Paperwork was there but [employer] never sent order I called patient and verified that she still wanted same shoe  Patient was very upset and wants to speak with manager when she comes back to be fit  08/14/2023 spoke with josue he is having order rushed Monday 11.11.24  Tylynn Braniff Cped, CFo, CFm

## 2023-08-07 ENCOUNTER — Other Ambulatory Visit: Payer: Self-pay

## 2023-08-07 ENCOUNTER — Other Ambulatory Visit (HOSPITAL_COMMUNITY): Payer: Self-pay

## 2023-08-08 MED ORDER — EVOLOCUMAB 140 MG/ML ~~LOC~~ SOAJ
140.0000 mg | SUBCUTANEOUS | 2 refills | Status: DC
  Filled 2023-08-08: qty 2, 28d supply, fill #0
  Filled 2023-09-06: qty 2, 28d supply, fill #1
  Filled 2023-10-02: qty 2, 28d supply, fill #2

## 2023-08-10 ENCOUNTER — Other Ambulatory Visit: Payer: Self-pay

## 2023-08-10 ENCOUNTER — Other Ambulatory Visit (HOSPITAL_COMMUNITY): Payer: Self-pay

## 2023-08-10 DIAGNOSIS — Z6841 Body Mass Index (BMI) 40.0 and over, adult: Secondary | ICD-10-CM | POA: Diagnosis not present

## 2023-08-10 DIAGNOSIS — H02849 Edema of unspecified eye, unspecified eyelid: Secondary | ICD-10-CM | POA: Diagnosis not present

## 2023-08-10 DIAGNOSIS — I10 Essential (primary) hypertension: Secondary | ICD-10-CM | POA: Diagnosis not present

## 2023-08-10 DIAGNOSIS — G629 Polyneuropathy, unspecified: Secondary | ICD-10-CM | POA: Diagnosis not present

## 2023-08-10 DIAGNOSIS — E1169 Type 2 diabetes mellitus with other specified complication: Secondary | ICD-10-CM | POA: Diagnosis not present

## 2023-08-11 ENCOUNTER — Other Ambulatory Visit (HOSPITAL_COMMUNITY): Payer: Self-pay

## 2023-08-11 ENCOUNTER — Other Ambulatory Visit: Payer: Self-pay

## 2023-08-15 ENCOUNTER — Other Ambulatory Visit (HOSPITAL_COMMUNITY): Payer: Self-pay

## 2023-08-17 ENCOUNTER — Other Ambulatory Visit: Payer: Self-pay

## 2023-08-19 ENCOUNTER — Other Ambulatory Visit (HOSPITAL_COMMUNITY): Payer: Self-pay

## 2023-08-19 DIAGNOSIS — G4733 Obstructive sleep apnea (adult) (pediatric): Secondary | ICD-10-CM | POA: Diagnosis not present

## 2023-08-24 ENCOUNTER — Other Ambulatory Visit (HOSPITAL_COMMUNITY): Payer: Self-pay

## 2023-08-24 DIAGNOSIS — M130 Polyarthritis, unspecified: Secondary | ICD-10-CM | POA: Diagnosis not present

## 2023-08-24 DIAGNOSIS — I10 Essential (primary) hypertension: Secondary | ICD-10-CM | POA: Diagnosis not present

## 2023-08-24 DIAGNOSIS — E1169 Type 2 diabetes mellitus with other specified complication: Secondary | ICD-10-CM | POA: Diagnosis not present

## 2023-08-24 DIAGNOSIS — N189 Chronic kidney disease, unspecified: Secondary | ICD-10-CM | POA: Diagnosis not present

## 2023-08-24 DIAGNOSIS — R269 Unspecified abnormalities of gait and mobility: Secondary | ICD-10-CM | POA: Diagnosis not present

## 2023-08-24 DIAGNOSIS — G40001 Localization-related (focal) (partial) idiopathic epilepsy and epileptic syndromes with seizures of localized onset, not intractable, with status epilepticus: Secondary | ICD-10-CM | POA: Diagnosis not present

## 2023-08-24 MED ORDER — VERQUVO 10 MG PO TABS
10.0000 mg | ORAL_TABLET | Freq: Every morning | ORAL | 1 refills | Status: DC
Start: 2023-08-24 — End: 2024-01-14
  Filled 2023-08-24 – 2024-01-11 (×3): qty 30, 30d supply, fill #0

## 2023-08-25 ENCOUNTER — Other Ambulatory Visit (HOSPITAL_COMMUNITY): Payer: Self-pay

## 2023-08-31 ENCOUNTER — Other Ambulatory Visit (HOSPITAL_COMMUNITY): Payer: Self-pay

## 2023-09-01 ENCOUNTER — Other Ambulatory Visit (HOSPITAL_COMMUNITY): Payer: Self-pay

## 2023-09-05 ENCOUNTER — Other Ambulatory Visit (HOSPITAL_COMMUNITY): Payer: Self-pay

## 2023-09-07 ENCOUNTER — Other Ambulatory Visit (HOSPITAL_COMMUNITY): Payer: Self-pay

## 2023-09-07 DIAGNOSIS — I10 Essential (primary) hypertension: Secondary | ICD-10-CM | POA: Diagnosis not present

## 2023-09-07 DIAGNOSIS — G40001 Localization-related (focal) (partial) idiopathic epilepsy and epileptic syndromes with seizures of localized onset, not intractable, with status epilepticus: Secondary | ICD-10-CM | POA: Diagnosis not present

## 2023-09-07 DIAGNOSIS — E089 Diabetes mellitus due to underlying condition without complications: Secondary | ICD-10-CM | POA: Diagnosis not present

## 2023-09-07 DIAGNOSIS — G629 Polyneuropathy, unspecified: Secondary | ICD-10-CM | POA: Diagnosis not present

## 2023-09-07 DIAGNOSIS — E7801 Familial hypercholesterolemia: Secondary | ICD-10-CM | POA: Diagnosis not present

## 2023-09-07 DIAGNOSIS — E1169 Type 2 diabetes mellitus with other specified complication: Secondary | ICD-10-CM | POA: Diagnosis not present

## 2023-09-07 MED ORDER — MONTELUKAST SODIUM 10 MG PO TABS
10.0000 mg | ORAL_TABLET | Freq: Every day | ORAL | 1 refills | Status: DC
  Filled 2023-09-07: qty 90, 90d supply, fill #0
  Filled 2023-10-02 – 2023-11-18 (×2): qty 90, 90d supply, fill #1

## 2023-09-08 ENCOUNTER — Other Ambulatory Visit (HOSPITAL_COMMUNITY): Payer: Self-pay

## 2023-09-14 ENCOUNTER — Other Ambulatory Visit (HOSPITAL_COMMUNITY): Payer: Self-pay

## 2023-09-19 ENCOUNTER — Other Ambulatory Visit (HOSPITAL_COMMUNITY): Payer: Self-pay

## 2023-09-25 ENCOUNTER — Ambulatory Visit: Payer: PPO | Admitting: Orthopaedic Surgery

## 2023-09-25 VITALS — Ht 64.0 in | Wt 238.0 lb

## 2023-09-25 DIAGNOSIS — M170 Bilateral primary osteoarthritis of knee: Secondary | ICD-10-CM | POA: Diagnosis not present

## 2023-09-25 DIAGNOSIS — M1711 Unilateral primary osteoarthritis, right knee: Secondary | ICD-10-CM

## 2023-09-25 NOTE — Progress Notes (Unsigned)
Office Visit Note   Patient: Cathy Gonzalez           Date of Birth: 10/11/1945           MRN: [id number] Visit Date: 09/25/2023              Requested by: Renaye Rakers, MD [address] [place],  [place] [address] PCP: Renaye Rakers, MD   Assessment & Plan: Visit Diagnoses: No diagnosis found.  Plan: ***  Follow-Up Instructions: No follow-ups on file.   Orders:  No orders of the defined types were placed in this encounter.  No orders of the defined types were placed in this encounter.     Procedures: No procedures performed   Clinical Data: No additional findings.   Subjective: Chief Complaint  Patient presents with   Right Knee - Pain    HPI  Review of Systems   Objective: Vital Signs: Ht 5\' 4"  (1.626 m)   Wt 238 lb (108 kg)   BMI 40.85 kg/m   Physical Exam  Ortho Exam  Specialty Comments:  No specialty comments available.  Imaging: No results found.   PMFS History: Patient Active Problem List   Diagnosis Date Noted   Gouty neuritis (HCC) 03/16/2023   Paroxysmal A-fib (HCC) 03/16/2023   Pain in right hand 10/28/2022   Impingement syndrome of right shoulder 08/12/2022   S/P cervical spinal fusion 06/25/2022   Pain due to onychomycosis of toenails of both feet 05/06/2022   Cervical stenosis of spine 02/24/2022   Neck pain 12/03/2021   Abnormal liver function tests 02/12/2021   Diarrhea 02/12/2021   Gastro-esophageal reflux disease without esophagitis 02/12/2021   Periumbilical pain 02/12/2021   History of colonic polyps 02/12/2021   Portal hypertension (HCC) 02/12/2021   Screening for malignant neoplasm of colon 02/12/2021   Type 2 diabetes mellitus without complications (HCC) 02/12/2021   Primary osteoarthritis, left hand 11/07/2020   Arthritis of carpometacarpal (CMC) joint of thumb 07/18/2020   Bilateral primary osteoarthritis of knee 06/20/2020   Bilateral primary osteoarthritis of hip 06/07/2020   Right thigh pain 05/09/2020    Low back pain 07/14/2019   OSA on CPAP 05/16/2019   Diabetic neuropathy associated with type 2 diabetes mellitus (HCC) 12/13/2018   Chronic pain of right knee 12/09/2018   Thumb pain, left 12/09/2018   Dizziness and giddiness 01/14/2016   Hepatic cirrhosis (HCC) 07/26/2015   Nocturnal seizures (HCC) 05/14/2015   Chronic hepatitis C without hepatic coma (HCC) 03/14/2015   Vertigo, central 02/11/2014   Difficulty walking 02/11/2014   Diabetes mellitus (HCC) 02/11/2014   Dysphagia 02/11/2014   Class 3 severe obesity with serious comorbidity and body mass index (BMI) of 40.0 to 44.9 in adult, unspecified obesity type (HCC) 07/26/2010   Essential hypertension, benign 07/26/2010   DEGENERATIVE JOINT DISEASE, GENERALIZED 07/26/2010   Past Medical History:  Diagnosis Date   Arthritis    Cataract    left cataract removed in 2017   Diabetes mellitus    Diabetic neuropathy (HCC)    Diabetic neuropathy, type II diabetes mellitus (HCC)    Hepatitis C    took tx for    Hypertension    Hypokalemia    Knee pain    right    Morbidly obese (HCC)    Nocturnal seizures (HCC) 05/14/2015   no bad seizure since 1980's   OSA on CPAP    uses cpap 2-3 week   Plantar fasciitis    both feet  Family History  Problem Relation Age of Onset   Diabetes Mother    Heart attack Mother    Diabetes Sister    Diabetes Brother    Cancer Sister        breast   Breast cancer Sister    Seizures Neg Hx     Past Surgical History:  Procedure Laterality Date   ABDOMINAL HYSTERECTOMY     partial   ANTERIOR CERVICAL DECOMP/DISCECTOMY FUSION N/A 02/24/2022   Procedure: CERVICAL FOUR- CERVICAL FIVE ANTERIOR CERVICAL DECOMPRESSION/DISCECTOMY FUSION ONE LEVEL WITH ALLOGRAFT AND PLATE;  Surgeon: Eldred Manges, MD;  Location: [facility];  Service: Orthopedics;  Laterality: N/A;   CATARACT EXTRACTION Left    COLONOSCOPY WITH PROPOFOL N/A 08/21/2015   Procedure: COLONOSCOPY WITH PROPOFOL;  Surgeon: [name], MD;   [facility];  Service: Endoscopy;  Laterality: N/A;   COLONOSCOPY WITH PROPOFOL N/A 08/22/2022   Procedure: COLONOSCOPY WITH PROPOFOL;  Surgeon: [name], MD;  [facility];  Service: Gastroenterology;  Laterality: N/A;   ESOPHAGOGASTRODUODENOSCOPY (EGD) WITH PROPOFOL N/A 08/21/2015   Procedure: ESOPHAGOGASTRODUODENOSCOPY (EGD) WITH PROPOFOL;  Surgeon: [name], MD;  [facility];  Service: Endoscopy;  Laterality: N/A;   ESOPHAGOGASTRODUODENOSCOPY (EGD) WITH PROPOFOL N/A 10/08/2018   Procedure: ESOPHAGOGASTRODUODENOSCOPY (EGD) WITH PROPOFOL;  Surgeon: [name], MD;  [facility];  Service: Endoscopy;  Laterality: N/A;   ESOPHAGOGASTRODUODENOSCOPY (EGD) WITH PROPOFOL N/A 08/22/2022   Procedure: ESOPHAGOGASTRODUODENOSCOPY (EGD) WITH PROPOFOL;  Surgeon: [name], MD;  [facility];  Service: Gastroenterology;  Laterality: N/A;   EYE SURGERY Bilateral    cataracts   POLYPECTOMY  08/22/2022   Procedure: POLYPECTOMY;  Surgeon: [name], MD;  [facility];  Service: Gastroenterology;;   TUBAL LIGATION     Social History   Occupational History   Occupation: [employer]  Tobacco Use   Smoking status: Never   Smokeless tobacco: Never  Vaping Use   Vaping status: Never Used  Substance and Sexual Activity   Alcohol use: No    Alcohol/week: 0.0 standard drinks of alcohol   Drug use: No   Sexual activity: Never

## 2023-09-26 ENCOUNTER — Other Ambulatory Visit (HOSPITAL_COMMUNITY): Payer: Self-pay

## 2023-09-28 ENCOUNTER — Other Ambulatory Visit (HOSPITAL_COMMUNITY): Payer: Self-pay

## 2023-09-28 DIAGNOSIS — M1711 Unilateral primary osteoarthritis, right knee: Secondary | ICD-10-CM

## 2023-09-28 DIAGNOSIS — M170 Bilateral primary osteoarthritis of knee: Secondary | ICD-10-CM | POA: Diagnosis not present

## 2023-09-28 MED ORDER — LIDOCAINE HCL 1 % IJ SOLN
0.5000 mL | INTRAMUSCULAR | Status: AC | PRN
Start: 2023-09-28 — End: 2023-09-28
  Administered 2023-09-28: .5 mL

## 2023-09-28 MED ORDER — METHYLPREDNISOLONE ACETATE 40 MG/ML IJ SUSP
40.0000 mg | INTRAMUSCULAR | Status: AC | PRN
Start: 2023-09-28 — End: 2023-09-28
  Administered 2023-09-28: 40 mg via INTRA_ARTICULAR

## 2023-09-28 MED ORDER — BUPIVACAINE HCL 0.25 % IJ SOLN
4.0000 mL | INTRAMUSCULAR | Status: AC | PRN
Start: 2023-09-28 — End: 2023-09-28
  Administered 2023-09-28: 4 mL via INTRA_ARTICULAR

## 2023-10-02 ENCOUNTER — Other Ambulatory Visit (HOSPITAL_COMMUNITY): Payer: Self-pay

## 2023-10-02 ENCOUNTER — Other Ambulatory Visit: Payer: Self-pay

## 2023-10-05 ENCOUNTER — Ambulatory Visit (INDEPENDENT_AMBULATORY_CARE_PROVIDER_SITE_OTHER): Payer: PPO

## 2023-10-05 ENCOUNTER — Other Ambulatory Visit (HOSPITAL_COMMUNITY): Payer: Self-pay

## 2023-10-05 DIAGNOSIS — M2142 Flat foot [pes planus] (acquired), left foot: Secondary | ICD-10-CM

## 2023-10-05 DIAGNOSIS — E1149 Type 2 diabetes mellitus with other diabetic neurological complication: Secondary | ICD-10-CM

## 2023-10-05 DIAGNOSIS — E1140 Type 2 diabetes mellitus with diabetic neuropathy, unspecified: Secondary | ICD-10-CM | POA: Diagnosis not present

## 2023-10-05 DIAGNOSIS — M722 Plantar fascial fibromatosis: Secondary | ICD-10-CM

## 2023-10-05 DIAGNOSIS — M2141 Flat foot [pes planus] (acquired), right foot: Secondary | ICD-10-CM

## 2023-10-05 DIAGNOSIS — M21622 Bunionette of left foot: Secondary | ICD-10-CM | POA: Diagnosis not present

## 2023-10-05 NOTE — Progress Notes (Signed)

## 2023-10-08 ENCOUNTER — Other Ambulatory Visit (HOSPITAL_COMMUNITY): Payer: Self-pay

## 2023-10-09 ENCOUNTER — Other Ambulatory Visit (HOSPITAL_COMMUNITY): Payer: Self-pay

## 2023-10-09 ENCOUNTER — Other Ambulatory Visit: Payer: Self-pay

## 2023-10-09 MED ORDER — METFORMIN HCL 500 MG PO TABS
500.0000 mg | ORAL_TABLET | Freq: Every evening | ORAL | 0 refills | Status: DC
  Filled 2023-10-09: qty 90, 90d supply, fill #0

## 2023-10-12 ENCOUNTER — Telehealth: Payer: Self-pay | Admitting: Orthopaedic Surgery

## 2023-10-12 ENCOUNTER — Encounter: Payer: Self-pay | Admitting: Physical Therapy

## 2023-10-12 ENCOUNTER — Ambulatory Visit: Payer: PPO | Attending: Family Medicine | Admitting: Physical Therapy

## 2023-10-12 DIAGNOSIS — M545 Low back pain, unspecified: Secondary | ICD-10-CM

## 2023-10-12 DIAGNOSIS — R262 Difficulty in walking, not elsewhere classified: Secondary | ICD-10-CM | POA: Insufficient documentation

## 2023-10-12 DIAGNOSIS — M6281 Muscle weakness (generalized): Secondary | ICD-10-CM | POA: Insufficient documentation

## 2023-10-12 DIAGNOSIS — R2681 Unsteadiness on feet: Secondary | ICD-10-CM | POA: Insufficient documentation

## 2023-10-12 NOTE — Telephone Encounter (Signed)
 Patient would a referral to see Dr Alvester Morin again for another back injection. Pt states she discussed it with Dr Ophelia Charter at her last visit.

## 2023-10-12 NOTE — Therapy (Signed)
 OUTPATIENT PHYSICAL THERAPY LOWER EXTREMITY EVALUATION   Patient Name: Cathy Gonzalez MRN: [id number] DOB:1946-04-13, 78 y.o., female Today's Date: 10/12/2023  END OF SESSION:  PT End of Session - 10/12/23 0926     Visit Number 1    Date for PT Re-Evaluation 01/10/24    Authorization Type HTA    PT Start Time 0926    PT Stop Time 1010    PT Time Calculation (min) 44 min    Activity Tolerance Patient tolerated treatment well    Behavior During Therapy [facility] for tasks assessed/performed             Past Medical History:  Diagnosis Date   Arthritis    Cataract    left cataract removed in 2017   Diabetes mellitus    Diabetic neuropathy (HCC)    Diabetic neuropathy, type II diabetes mellitus (HCC)    Hepatitis C    took tx for    Hypertension    Hypokalemia    Knee pain    right    Morbidly obese (HCC)    Nocturnal seizures (HCC) 05/14/2015   no bad seizure since 1980's   OSA on CPAP    uses cpap 2-3 week   Plantar fasciitis    both feet   Past Surgical History:  Procedure Laterality Date   ABDOMINAL HYSTERECTOMY     partial   ANTERIOR CERVICAL DECOMP/DISCECTOMY FUSION N/A 02/24/2022   Procedure: CERVICAL FOUR- CERVICAL FIVE ANTERIOR CERVICAL DECOMPRESSION/DISCECTOMY FUSION ONE LEVEL WITH ALLOGRAFT AND PLATE;  Surgeon: Barbarann Oneil BROCKS, MD;  Location: [facility];  Service: Orthopedics;  Laterality: N/A;   CATARACT EXTRACTION Left    COLONOSCOPY WITH PROPOFOL  N/A 08/21/2015   Procedure: COLONOSCOPY WITH PROPOFOL ;  Surgeon: Belvie Just, MD;  Location: [facility];  Service: Endoscopy;  Laterality: N/A;   COLONOSCOPY WITH PROPOFOL  N/A 08/22/2022   Procedure: COLONOSCOPY WITH PROPOFOL ;  Surgeon: Just Belvie, MD;  [facility]: [facility];  Service: Gastroenterology;  Laterality: N/A;   ESOPHAGOGASTRODUODENOSCOPY (EGD) WITH PROPOFOL  N/A 08/21/2015   Procedure: ESOPHAGOGASTRODUODENOSCOPY (EGD) WITH PROPOFOL ;  Surgeon: Belvie Just, MD;  [facility];  Service: Endoscopy;   Laterality: N/A;   ESOPHAGOGASTRODUODENOSCOPY (EGD) WITH PROPOFOL  N/A 10/08/2018   Procedure: ESOPHAGOGASTRODUODENOSCOPY (EGD) WITH PROPOFOL ;  Surgeon: Just Belvie, MD;  [facility]: [facility];  Service: Endoscopy;  Laterality: N/A;   ESOPHAGOGASTRODUODENOSCOPY (EGD) WITH PROPOFOL  N/A 08/22/2022   Procedure: ESOPHAGOGASTRODUODENOSCOPY (EGD) WITH PROPOFOL ;  Surgeon: Just Belvie, MD;  [facility]: [facility];  Service: Gastroenterology;  Laterality: N/A;   EYE SURGERY Bilateral    cataracts   POLYPECTOMY  08/22/2022   Procedure: POLYPECTOMY;  Surgeon: Just Belvie, MD;  Location: [facility];  Service: Gastroenterology;;   TUBAL LIGATION     Patient Active Problem List   Diagnosis Date Noted   Gouty neuritis (HCC) 03/16/2023   Paroxysmal A-fib (HCC) 03/16/2023   Pain in right hand 10/28/2022   Impingement syndrome of right shoulder 08/12/2022   S/P cervical spinal fusion 06/25/2022   Pain due to onychomycosis of toenails of both feet 05/06/2022   Cervical stenosis of spine 02/24/2022   Neck pain 12/03/2021   Abnormal liver function tests 02/12/2021   Diarrhea 02/12/2021   Gastro-esophageal reflux disease without esophagitis 02/12/2021   Periumbilical pain 02/12/2021   History of colonic polyps 02/12/2021   Portal hypertension (HCC) 02/12/2021   Screening for malignant neoplasm of colon 02/12/2021   Type 2 diabetes mellitus without complications (HCC) 02/12/2021   Primary osteoarthritis, left hand 11/07/2020  Arthritis of carpometacarpal (CMC) joint of thumb 07/18/2020   Bilateral primary osteoarthritis of knee 06/20/2020   Bilateral primary osteoarthritis of hip 06/07/2020   Right thigh pain 05/09/2020   Low back pain 07/14/2019   OSA on CPAP 05/16/2019   Diabetic neuropathy associated with type 2 diabetes mellitus (HCC) 12/13/2018   Chronic pain of right knee 12/09/2018   Thumb pain, left 12/09/2018   Dizziness and giddiness 01/14/2016   Hepatic cirrhosis (HCC)  07/26/2015   Nocturnal seizures (HCC) 05/14/2015   Chronic hepatitis C without hepatic coma (HCC) 03/14/2015   Vertigo, central 02/11/2014   Difficulty walking 02/11/2014   Diabetes mellitus (HCC) 02/11/2014   Dysphagia 02/11/2014   Class 3 severe obesity with serious comorbidity and body mass index (BMI) of 40.0 to 44.9 in adult, unspecified obesity type (HCC) 07/26/2010   Essential hypertension, benign 07/26/2010   DEGENERATIVE JOINT DISEASE, GENERALIZED 07/26/2010    PCP: Benjamine, MD  REFERRING PROVIDER: Benjamine, MD  REFERRING DIAG: unsteadiness with walking  THERAPY DIAG:  Muscle weakness (generalized)  Difficulty in walking, not elsewhere classified  Unsteady gait  Rationale for Evaluation and Treatment: Rehabilitation  ONSET DATE: 09/23/23  SUBJECTIVE:   SUBJECTIVE STATEMENT: Patient reports that she has had balance issues for the past year, she reports that she feels she is off balance.  She reports that she has had vertigo in the past, has neuropathy in the feet.  Reports right knee pain is from arthritis.  Denies falls  PERTINENT HISTORY: See above PAIN:  Are you having pain? Yes: NPRS scale: 4/10 Pain location: feet and right knee Pain description: feet burn from neuropathy, right knee aches Aggravating factors: walking Relieving factors: rest  PRECAUTIONS: Fall  RED FLAGS: None   WEIGHT BEARING RESTRICTIONS: No  FALLS:  Has patient fallen in last 6 months? No and reports that she is just unsteady  LIVING ENVIRONMENT: Lives with: lives with their family Lives in: House/apartment Stairs: No Has following equipment at home: None  OCCUPATION: Cashier 4 hour shifts, reports that she can sit and stand  PLOF: Independent and does her housework, uses an art gallery manager to do her shopping  PATIENT GOALS: walk better, be steady  NEXT MD VISIT: 10/19/23  OBJECTIVE:  Note: Objective measures were completed at Evaluation unless otherwise  noted.  DIAGNOSTIC FINDINGS: none  COGNITION: Overall cognitive status: Within functional limits for tasks assessed     SENSATION: Can feel the calves, but has minimal sensation in the feet  EDEMA:  Has some compression stocking  MUSCLE LENGTH: Tight mms in the HS and piriformis  POSTURE: rounded shoulders and forward head  PALPATION: Some tenderness in the calves  LOWER EXTREMITY ROM:  WFL's  LOWER EXTREMITY MMT:  MMT Right eval Left eval  Hip flexion 4 4  Hip extension    Hip abduction    Hip adduction    Hip internal rotation    Hip external rotation    Knee flexion 4 4  Knee extension 4- 4-  Ankle dorsiflexion 4 4-  Ankle plantarflexion    Ankle inversion 4- 4-  Ankle eversion 4- 4-   (Blank rows = not tested) FUNCTIONAL TESTS:  5 times sit to stand: 35 seconds had to use arm rests to push up with hands Timed up and go (TUG): 20 seconds, limp on the right, unsteady reached out and touched the walls and the door frames Berg Balance Scale: 31/56  GAIT: Distance walked: 70 feet Assistive device utilized: None Level of  assistance: CGA Comments: tends to use walls , unsteady, limps on the right                                                                                                                                TREATMENT DATE:  10/12/23 Nustep level 4 x 5 minutes     PATIENT EDUCATION:  Education details: POC/HEP Person educated: Patient Education method: Programmer, Multimedia, Facilities Manager, Verbal cues, and Handouts Education comprehension: verbalized understanding  HOME EXERCISE PROGRAM: Access Code: [email or web address] URL: [email or web address] Date: 10/12/2023 Prepared by: Ozell Mainland  Exercises - Seated March  - 1 x daily - 7 x weekly - 2 sets - 10 reps - 3 hold - Seated Long Arc Quad  - 1 x daily - 7 x weekly - 2 sets - 10 reps - 3 hold - Seated Toe Raise  - 1 x daily - 7 x weekly - 2 sets - 10 reps - 30 hold - Seated Heel Raise   - 1 x daily - 7 x weekly - 2 sets - 10 reps - 3 hold  ASSESSMENT:  CLINICAL IMPRESSION: Patient is a 78 y.o. female who was seen today for physical therapy evaluation and treatment for weakness, leg pain and unsteady gait.  She has neuropathy and cannot feel her feet well, has some right knee pain due to OA, tends to creep along furniture and walls for balance, scored 31/56 with Lars and is at a high risk for falls, I did recommend her getting a cane  .   OBJECTIVE IMPAIRMENTS: Abnormal gait, cardiopulmonary status limiting activity, decreased activity tolerance, decreased balance, decreased coordination, decreased endurance, decreased mobility, difficulty walking, decreased ROM, decreased strength, decreased safety awareness, impaired flexibility, and pain.   REHAB POTENTIAL: Good  CLINICAL DECISION MAKING: Stable/uncomplicated  EVALUATION COMPLEXITY: Low   GOALS: Goals reviewed with patient? Yes  SHORT TERM GOALS: Target date: 11/01/23 Independent with initial HEP Baseline: not exercising Goal status: INITIAL   LONG TERM GOALS: Target date: 12/10/23  Independent with advanced HEP Goal status: INITIAL  2.  Decrease TUG to 13 seconds Baseline:  20 seconds Goal status: INITIAL  3.  Decrease 5XSTS to 21 seconds Baseline: 35 seconds with using hand to push up from armrest Goal status: INITIAL  4.  Increase Berg balance test score to 46/56 Baseline: 31/56 Goal status: INITIAL  5.  Walk safely >500 feet with SPC Goal status: INITIAL  PLAN:  PT FREQUENCY: 1-2x/week  PT DURATION: 12 weeks  PLANNED INTERVENTIONS: 97164- PT Re-evaluation, 97110-Therapeutic exercises, 97530- Therapeutic activity, 97112- Neuromuscular re-education, 97535- Self Care, [id number]- Manual therapy, [phone]- Gait training, Patient/Family education, Balance training, Stair training, and Vestibular training  PLAN FOR NEXT SESSION: start exercises for mobility, strength and balance   Tycho Cheramie W,  PT 10/12/2023, 9:26 AM

## 2023-10-12 NOTE — Telephone Encounter (Signed)
 Referral entered

## 2023-10-13 ENCOUNTER — Ambulatory Visit: Payer: PPO | Admitting: Podiatry

## 2023-10-13 ENCOUNTER — Telehealth: Payer: Self-pay

## 2023-10-13 NOTE — Telephone Encounter (Signed)
 home health order faxed

## 2023-10-14 ENCOUNTER — Telehealth: Payer: Self-pay

## 2023-10-14 NOTE — Telephone Encounter (Signed)
 HTA AUTH REC'D VALID THRU 10/05/23-01/03/2024 AUTH [id number]

## 2023-10-16 ENCOUNTER — Other Ambulatory Visit (HOSPITAL_COMMUNITY): Payer: Self-pay

## 2023-10-16 ENCOUNTER — Other Ambulatory Visit: Payer: Self-pay

## 2023-10-19 ENCOUNTER — Other Ambulatory Visit (HOSPITAL_COMMUNITY): Payer: Self-pay

## 2023-10-19 ENCOUNTER — Ambulatory Visit: Payer: PPO

## 2023-10-19 ENCOUNTER — Other Ambulatory Visit: Payer: Self-pay

## 2023-10-19 DIAGNOSIS — R2681 Unsteadiness on feet: Secondary | ICD-10-CM

## 2023-10-19 DIAGNOSIS — I10 Essential (primary) hypertension: Secondary | ICD-10-CM | POA: Diagnosis not present

## 2023-10-19 DIAGNOSIS — M6281 Muscle weakness (generalized): Secondary | ICD-10-CM | POA: Diagnosis not present

## 2023-10-19 DIAGNOSIS — E1169 Type 2 diabetes mellitus with other specified complication: Secondary | ICD-10-CM | POA: Diagnosis not present

## 2023-10-19 DIAGNOSIS — R262 Difficulty in walking, not elsewhere classified: Secondary | ICD-10-CM

## 2023-10-19 MED ORDER — COLCHICINE 0.6 MG PO TABS
0.6000 mg | ORAL_TABLET | Freq: Every day | ORAL | 1 refills | Status: DC
  Filled 2023-10-19: qty 90, 90d supply, fill #0
  Filled 2024-01-18: qty 90, 90d supply, fill #1

## 2023-10-19 NOTE — Therapy (Signed)
 OUTPATIENT PHYSICAL THERAPY LOWER EXTREMITY TREATMENT   Patient Name: Cathy Gonzalez MRN: [id number] DOB:March 15, 1946, 78 y.o., female Today's Date: 10/19/2023  END OF SESSION:    Past Medical History:  Diagnosis Date   Arthritis    Cataract    left cataract removed in 2017   Diabetes mellitus    Diabetic neuropathy (HCC)    Diabetic neuropathy, type II diabetes mellitus (HCC)    Hepatitis C    took tx for    Hypertension    Hypokalemia    Knee pain    right    Morbidly obese (HCC)    Nocturnal seizures (HCC) 05/14/2015   no bad seizure since 1980's   OSA on CPAP    uses cpap 2-3 week   Plantar fasciitis    both feet   Past Surgical History:  Procedure Laterality Date   ABDOMINAL HYSTERECTOMY     partial   ANTERIOR CERVICAL DECOMP/DISCECTOMY FUSION N/A 02/24/2022   Procedure: CERVICAL FOUR- CERVICAL FIVE ANTERIOR CERVICAL DECOMPRESSION/DISCECTOMY FUSION ONE LEVEL WITH ALLOGRAFT AND PLATE;  Surgeon: Barbarann Oneil BROCKS, MD;  Location: [facility];  Service: Orthopedics;  Laterality: N/A;   CATARACT EXTRACTION Left    COLONOSCOPY WITH PROPOFOL  N/A 08/21/2015   Procedure: COLONOSCOPY WITH PROPOFOL ;  Surgeon: Belvie Just, MD;  Location: [facility];  Service: Endoscopy;  Laterality: N/A;   COLONOSCOPY WITH PROPOFOL  N/A 08/22/2022   Procedure: COLONOSCOPY WITH PROPOFOL ;  Surgeon: Just Belvie, MD;  Location: [facility];  Service: Gastroenterology;  Laterality: N/A;   ESOPHAGOGASTRODUODENOSCOPY (EGD) WITH PROPOFOL  N/A 08/21/2015   Procedure: ESOPHAGOGASTRODUODENOSCOPY (EGD) WITH PROPOFOL ;  Surgeon: Belvie Just, MD;  Location: [facility];  Service: Endoscopy;  Laterality: N/A;   ESOPHAGOGASTRODUODENOSCOPY (EGD) WITH PROPOFOL  N/A 10/08/2018   Procedure: ESOPHAGOGASTRODUODENOSCOPY (EGD) WITH PROPOFOL ;  Surgeon: Just Belvie, MD;  Location: [facility];  Service: Endoscopy;  Laterality: N/A;   ESOPHAGOGASTRODUODENOSCOPY (EGD) WITH PROPOFOL  N/A 08/22/2022   Procedure:  ESOPHAGOGASTRODUODENOSCOPY (EGD) WITH PROPOFOL ;  Surgeon: Just Belvie, MD;  Location: [facility];  Service: Gastroenterology;  Laterality: N/A;   EYE SURGERY Bilateral    cataracts   POLYPECTOMY  08/22/2022   Procedure: POLYPECTOMY;  Surgeon: Just Belvie, MD;  Location: [facility];  Service: Gastroenterology;;   TUBAL LIGATION     Patient Active Problem List   Diagnosis Date Noted   Gouty neuritis (HCC) 03/16/2023   Paroxysmal A-fib (HCC) 03/16/2023   Pain in right hand 10/28/2022   Impingement syndrome of right shoulder 08/12/2022   S/P cervical spinal fusion 06/25/2022   Pain due to onychomycosis of toenails of both feet 05/06/2022   Cervical stenosis of spine 02/24/2022   Neck pain 12/03/2021   Abnormal liver function tests 02/12/2021   Diarrhea 02/12/2021   Gastro-esophageal reflux disease without esophagitis 02/12/2021   Periumbilical pain 02/12/2021   History of colonic polyps 02/12/2021   Portal hypertension (HCC) 02/12/2021   Screening for malignant neoplasm of colon 02/12/2021   Type 2 diabetes mellitus without complications (HCC) 02/12/2021   Primary osteoarthritis, left hand 11/07/2020   Arthritis of carpometacarpal (CMC) joint of thumb 07/18/2020   Bilateral primary osteoarthritis of knee 06/20/2020   Bilateral primary osteoarthritis of hip 06/07/2020   Right thigh pain 05/09/2020   Low back pain 07/14/2019   OSA on CPAP 05/16/2019   Diabetic neuropathy associated with type 2 diabetes mellitus (HCC) 12/13/2018   Chronic pain of right knee 12/09/2018   Thumb pain, left 12/09/2018   Dizziness and giddiness 01/14/2016   Hepatic cirrhosis (HCC)  07/26/2015   Nocturnal seizures (HCC) 05/14/2015   Chronic hepatitis C without hepatic coma (HCC) 03/14/2015   Vertigo, central 02/11/2014   Difficulty walking 02/11/2014   Diabetes mellitus (HCC) 02/11/2014   Dysphagia 02/11/2014   Class 3 severe obesity with serious comorbidity and body mass index (BMI) of 40.0 to  44.9 in adult, unspecified obesity type (HCC) 07/26/2010   Essential hypertension, benign 07/26/2010   DEGENERATIVE JOINT DISEASE, GENERALIZED 07/26/2010    PCP: Benjamine, MD  REFERRING PROVIDER: Benjamine, MD  REFERRING DIAG: unsteadiness with walking  THERAPY DIAG:  No diagnosis found.  Rationale for Evaluation and Treatment: Rehabilitation  ONSET DATE: 09/23/23  SUBJECTIVE:   SUBJECTIVE STATEMENT: Patient reports that she is performing the exercises 2 x day.  She has soreness in her calves.   PERTINENT HISTORY: See above PAIN:  Are you having pain? Yes: NPRS scale: 4/10 Pain location: feet and right knee Pain description: feet burn from neuropathy, right knee aches Aggravating factors: walking Relieving factors: rest  PRECAUTIONS: Fall  RED FLAGS: None   WEIGHT BEARING RESTRICTIONS: No  FALLS:  Has patient fallen in last 6 months? 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  DIAGNOSTIC FINDINGS: none  COGNITION: Overall cognitive status: Within functional limits for tasks assessed     SENSATION: Can feel the calves, but has minimal sensation in the feet  EDEMA:  Has some compression stocking  MUSCLE LENGTH: Tight mms in the HS and piriformis  POSTURE: rounded shoulders and forward head  PALPATION: Some tenderness in the calves  LOWER EXTREMITY ROM:  WFL's  LOWER EXTREMITY MMT:  MMT Right eval Left eval  Hip flexion 4 4  Hip extension    Hip abduction    Hip adduction    Hip internal rotation    Hip  external rotation    Knee flexion 4 4  Knee extension 4- 4-  Ankle dorsiflexion 4 4-  Ankle plantarflexion    Ankle inversion 4- 4-  Ankle eversion 4- 4-   (Blank rows = not tested) FUNCTIONAL TESTS:  5 times sit to stand: 35 seconds had to use arm rests to push up with hands Timed up and go (TUG): 20 seconds, limp on the right, unsteady reached out and touched the walls and the door frames Berg Balance Scale: 31/56  GAIT: Distance walked: 70 feet Assistive device utilized: None Level of assistance: CGA Comments: tends to use walls , unsteady, limps on the right  TREATMENT DATE:  10/19/23:  Nustep L5 Ue's and LE's x 5 min 30 sec Patient demonstrated her home program: performed 20 reps each: Seated long arc quads,  Seated heel lifts,  Seated ankle dorsiflexion Seated march Instructed in seated ankle plantarflexor stretch and hamstring stretch with strap Inst in standing heel drops off black bar, alt, 10 sec holds Standing in ll bars for heel/toe rocks with B hand hold 15 x Standing for penguins alt 15 x , B hand hold support in ll bars   10/12/23 Nustep level 4 x 5 minutes      PATIENT EDUCATION:  Education details: POC/HEP Person educated: Patient Education method: Programmer, Multimedia, Facilities Manager, Verbal cues, and Handouts Education comprehension: verbalized understanding  HOME EXERCISE PROGRAM: Access Code: [email or web address] URL: [email or web address] Date: 10/12/2023 Prepared by: Ozell Mainland  Exercises - Seated March  - 1 x daily - 7 x weekly - 2 sets - 10 reps - 3 hold - Seated Long Arc Quad  - 1 x daily - 7 x weekly - 2 sets - 10 reps - 3 hold - Seated Toe Raise  - 1 x daily - 7 x weekly - 2 sets - 10 reps - 30 hold - Seated Heel Raise  - 1 x daily - 7 x weekly - 2 sets - 10 reps - 3 hold  ASSESSMENT:  CLINICAL  IMPRESSION: Patient is a 78 y.o. female who participated with physical therapy treatment for weakness, leg pain and unsteady gait. She reports improved walking pattern since initiating gait.  Added stretches due to her soreness B calves with the ex.  She tolerated well but was fatigued by end of session.    OBJECTIVE IMPAIRMENTS: Abnormal gait, cardiopulmonary status limiting activity, decreased activity tolerance, decreased balance, decreased coordination, decreased endurance, decreased mobility, difficulty walking, decreased ROM, decreased strength, decreased safety awareness, impaired flexibility, and pain.   REHAB POTENTIAL: Good  CLINICAL DECISION MAKING: Stable/uncomplicated  EVALUATION COMPLEXITY: Low   GOALS: Goals reviewed with patient? Yes  SHORT TERM GOALS: Target date: 11/01/23 Independent with initial HEP Baseline: not exercising Goal status: INITIAL   LONG TERM GOALS: Target date: 12/10/23  Independent with advanced HEP Goal status: INITIAL  2.  Decrease TUG to 13 seconds Baseline:  20 seconds Goal status: INITIAL  3.  Decrease 5XSTS to 21 seconds Baseline: 35 seconds with using hand to push up from armrest Goal status: INITIAL  4.  Increase Berg balance test score to 46/56 Baseline: 31/56 Goal status: INITIAL  5.  Walk safely >500 feet with SPC Goal status: INITIAL  PLAN:  PT FREQUENCY: 1-2x/week  PT DURATION: 12 weeks  PLANNED INTERVENTIONS: 97164- PT Re-evaluation, 97110-Therapeutic exercises, 97530- Therapeutic activity, 97112- Neuromuscular re-education, 97535- Self Care, [id number]- Manual therapy, [phone]- Gait training, Patient/Family education, Balance training, Stair training, and Vestibular training  PLAN FOR NEXT SESSION: continue with therex to improve righting reactions, balance, activity tolerance.  Dosha Broshears L Oda Placke, PT, DPT, OCS 10/19/2023, 4:02 PM

## 2023-10-20 ENCOUNTER — Other Ambulatory Visit: Payer: Self-pay

## 2023-10-20 ENCOUNTER — Other Ambulatory Visit (HOSPITAL_COMMUNITY): Payer: Self-pay

## 2023-10-22 ENCOUNTER — Other Ambulatory Visit (HOSPITAL_COMMUNITY): Payer: Self-pay

## 2023-10-24 ENCOUNTER — Other Ambulatory Visit (HOSPITAL_COMMUNITY): Payer: Self-pay

## 2023-10-26 ENCOUNTER — Ambulatory Visit: Payer: PPO | Admitting: Physical Therapy

## 2023-10-26 ENCOUNTER — Encounter: Payer: Self-pay | Admitting: Physical Therapy

## 2023-10-26 DIAGNOSIS — R262 Difficulty in walking, not elsewhere classified: Secondary | ICD-10-CM

## 2023-10-26 DIAGNOSIS — M6281 Muscle weakness (generalized): Secondary | ICD-10-CM

## 2023-10-26 DIAGNOSIS — R2681 Unsteadiness on feet: Secondary | ICD-10-CM

## 2023-10-26 NOTE — Therapy (Signed)
OUTPATIENT PHYSICAL THERAPY LOWER EXTREMITY TREATMENT   Patient Name: Cathy Gonzalez MRN: [id number] DOB:[birth year], 78 y.o., female Today's Date: 10/26/2023  END OF SESSION:  PT End of Session - 10/26/23 0837     Visit Number 3    Date for PT Re-Evaluation 01/10/24    Authorization Type HTA    PT Start Time 0838    PT Stop Time 0923    PT Time Calculation (min) 45 min    Activity Tolerance Patient tolerated treatment well    Behavior During Therapy [facility] for tasks assessed/performed              Past Medical History:  Diagnosis Date   Arthritis    Cataract    left cataract removed in 2017   Diabetes mellitus    Diabetic neuropathy (HCC)    Diabetic neuropathy, type II diabetes mellitus (HCC)    Hepatitis C    took tx for    Hypertension    Hypokalemia    Knee pain    right    Morbidly obese (HCC)    Nocturnal seizures (HCC) 05/14/2015   no bad seizure since 1980's   OSA on CPAP    uses cpap 2-3 week   Plantar fasciitis    both feet   Past Surgical History:  Procedure Laterality Date   ABDOMINAL HYSTERECTOMY     partial   ANTERIOR CERVICAL DECOMP/DISCECTOMY FUSION N/A 02/24/2022   Procedure: CERVICAL FOUR- CERVICAL FIVE ANTERIOR CERVICAL DECOMPRESSION/DISCECTOMY FUSION ONE LEVEL WITH ALLOGRAFT AND PLATE;  Surgeon: Eldred Manges, MD;  Location: [facility];  Service: Orthopedics;  Laterality: N/A;   CATARACT EXTRACTION Left    COLONOSCOPY WITH PROPOFOL N/A 08/21/2015   Procedure: COLONOSCOPY WITH PROPOFOL;  Surgeon: [name], MD;  Location: [facility];  Service: Endoscopy;  Laterality: N/A;   COLONOSCOPY WITH PROPOFOL N/A 08/22/2022   Procedure: COLONOSCOPY WITH PROPOFOL;  Surgeon: [name], MD;  Location: [facility];  Service: Gastroenterology;  Laterality: N/A;   ESOPHAGOGASTRODUODENOSCOPY (EGD) WITH PROPOFOL N/A 08/21/2015   Procedure: ESOPHAGOGASTRODUODENOSCOPY (EGD) WITH PROPOFOL;  Surgeon: [name], MD;  Location: [facility];  Service:  Endoscopy;  Laterality: N/A;   ESOPHAGOGASTRODUODENOSCOPY (EGD) WITH PROPOFOL N/A 10/08/2018   Procedure: ESOPHAGOGASTRODUODENOSCOPY (EGD) WITH PROPOFOL;  Surgeon: [name], MD;  Location: [facility];  Service: Endoscopy;  Laterality: N/A;   ESOPHAGOGASTRODUODENOSCOPY (EGD) WITH PROPOFOL N/A 08/22/2022   Procedure: ESOPHAGOGASTRODUODENOSCOPY (EGD) WITH PROPOFOL;  Surgeon: [name], MD;  Location: [facility];  Service: Gastroenterology;  Laterality: N/A;   EYE SURGERY Bilateral    cataracts   POLYPECTOMY  08/22/2022   Procedure: POLYPECTOMY;  Surgeon: [name], MD;  Location: [facility];  Service: Gastroenterology;;   TUBAL LIGATION     Patient Active Problem List   Diagnosis Date Noted   Gouty neuritis (HCC) 03/16/2023   Paroxysmal A-fib (HCC) 03/16/2023   Pain in right hand 10/28/2022   Impingement syndrome of right shoulder 08/12/2022   S/P cervical spinal fusion 06/25/2022   Pain due to onychomycosis of toenails of both feet 05/06/2022   Cervical stenosis of spine 02/24/2022   Neck pain 12/03/2021   Abnormal liver function tests 02/12/2021   Diarrhea 02/12/2021   Gastro-esophageal reflux disease without esophagitis 02/12/2021   Periumbilical pain 02/12/2021   History of colonic polyps 02/12/2021   Portal hypertension (HCC) 02/12/2021   Screening for malignant neoplasm of colon 02/12/2021   Type 2 diabetes mellitus without complications (HCC) 02/12/2021   Primary osteoarthritis, left hand 11/07/2020  Arthritis of carpometacarpal (CMC) joint of thumb 07/18/2020   Bilateral primary osteoarthritis of knee 06/20/2020   Bilateral primary osteoarthritis of hip 06/07/2020   Right thigh pain 05/09/2020   Low back pain 07/14/2019   OSA on CPAP 05/16/2019   Diabetic neuropathy associated with type 2 diabetes mellitus (HCC) 12/13/2018   Chronic pain of right knee 12/09/2018   Thumb pain, left 12/09/2018   Dizziness and giddiness 01/14/2016   Hepatic cirrhosis  (HCC) 07/26/2015   Nocturnal seizures (HCC) 05/14/2015   Chronic hepatitis C without hepatic coma (HCC) 03/14/2015   Vertigo, central 02/11/2014   Difficulty walking 02/11/2014   Diabetes mellitus (HCC) 02/11/2014   Dysphagia 02/11/2014   Class 3 severe obesity with serious comorbidity and body mass index (BMI) of 40.0 to 44.9 in adult, unspecified obesity type (HCC) 07/26/2010   Essential hypertension, benign 07/26/2010   DEGENERATIVE JOINT DISEASE, GENERALIZED 07/26/2010    PCP: Parke Simmers, MD  REFERRING PROVIDER: Parke Simmers, MD  REFERRING DIAG: unsteadiness with walking  THERAPY DIAG:  Difficulty in walking, not elsewhere classified  Muscle weakness (generalized)  Unsteady gait  Rationale for Evaluation and Treatment: Rehabilitation  ONSET DATE: 09/23/23  SUBJECTIVE:   SUBJECTIVE STATEMENT: Patient reports that she was very sore in the calves and very tired PERTINENT HISTORY: See above PAIN:  Are you having pain? Yes: NPRS scale: 4/10 Pain location: feet and right knee Pain description: feet burn from neuropathy, right knee aches Aggravating factors: walking Relieving factors: rest  PRECAUTIONS: Fall  RED FLAGS: None   WEIGHT BEARING RESTRICTIONS: No  FALLS:  Has patient fallen in last 6 months? 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  DIAGNOSTIC FINDINGS: none  COGNITION: Overall cognitive status: Within functional limits for tasks assessed     SENSATION: Can feel the calves, but has minimal sensation in the  feet  EDEMA:  Has some compression stocking  MUSCLE LENGTH: Tight mms in the HS and piriformis  POSTURE: rounded shoulders and forward head  PALPATION: Some tenderness in the calves  LOWER EXTREMITY ROM:  WFL's  LOWER EXTREMITY MMT:  MMT Right eval Left eval  Hip flexion 4 4  Hip extension    Hip abduction    Hip adduction    Hip internal rotation    Hip external rotation    Knee flexion 4 4  Knee extension 4- 4-  Ankle dorsiflexion 4 4-  Ankle plantarflexion    Ankle inversion 4- 4-  Ankle eversion 4- 4-   (Blank rows = not tested) FUNCTIONAL TESTS:  5 times sit to stand: 35 seconds had to use arm rests to push up with hands Timed up and go (TUG): 20 seconds, limp on the right, unsteady reached out and touched the walls and the door frames Berg Balance Scale: 31/56  GAIT: Distance walked: 70 feet Assistive device utilized: None Level of assistance: CGA Comments: tends to use walls , unsteady, limps on the right  TREATMENT DATE:  10/26/23 Nustep level 5 x 7 minutes Standing red tband row Standing red tband extension Leg curls 20# 2x10 Leg extension 10# 2x10 2.5# marches  2.5# hip abduction Standing on airex some ball toss needed CGA due to poor balance Calf stretches Side stepping in Pbars On airex reaching for numbers on wall On airex head turns Light HHA fast walk 2 laps, HR up to 130, recovery in less than 2 minutes, then did one more lap Cone toe touches with SPC and HHA  10/19/23:  Nustep L5 Ue's and LE's x 5 min 30 sec Patient demonstrated her home program: performed 20 reps each: Seated long arc quads,  Seated heel lifts,  Seated ankle dorsiflexion Seated march Instructed in seated ankle plantarflexor stretch and hamstring stretch with strap Inst in standing heel drops off black bar, alt, 10 sec holds Standing in ll  bars for heel/toe rocks with B hand hold 15 x Standing for penguins alt 15 x , B hand hold support in ll bars   10/12/23 Nustep level 4 x 5 minutes      PATIENT EDUCATION:  Education details: POC/HEP Person educated: Patient Education method: Programmer, multimedia, Facilities manager, Verbal cues, and Handouts Education comprehension: verbalized understanding  HOME EXERCISE PROGRAM: Access Code: [email or web address] URL: [email or web address] Date: 10/12/2023 Prepared by: Stacie Glaze  Exercises - Seated March  - 1 x daily - 7 x weekly - 2 sets - 10 reps - 3 hold - Seated Long Arc Quad  - 1 x daily - 7 x weekly - 2 sets - 10 reps - 3 hold - Seated Toe Raise  - 1 x daily - 7 x weekly - 2 sets - 10 reps - 30 hold - Seated Heel Raise  - 1 x daily - 7 x weekly - 2 sets - 10 reps - 3 hold  ASSESSMENT:  CLINICAL IMPRESSION: Patient is a 78 y.o. female who participated with physical therapy treatment for weakness, leg pain and unsteady gait. I continued to add exercises for strength, function, endurance and balance, she really struggles with balance on and off dynamic surfaces,  HR went up to 130 bpm with walking 2 laps briskly but recovered in less than 2 minutes.  She tolerated well but was fatigued by end of session.    OBJECTIVE IMPAIRMENTS: Abnormal gait, cardiopulmonary status limiting activity, decreased activity tolerance, decreased balance, decreased coordination, decreased endurance, decreased mobility, difficulty walking, decreased ROM, decreased strength, decreased safety awareness, impaired flexibility, and pain.   REHAB POTENTIAL: Good  CLINICAL DECISION MAKING: Stable/uncomplicated  EVALUATION COMPLEXITY: Low   GOALS: Goals reviewed with patient? Yes  SHORT TERM GOALS: Target date: 11/01/23 Independent with initial HEP Baseline: not exercising Goal status:met 10/26/23   LONG TERM GOALS: Target date: 12/10/23  Independent with advanced HEP Goal status: INITIAL  2.   Decrease TUG to 13 seconds Baseline:  20 seconds Goal status: INITIAL  3.  Decrease 5XSTS to 21 seconds Baseline: 35 seconds with using hand to push up from armrest Goal status: INITIAL  4.  Increase Berg balance test score to 46/56 Baseline: 31/56 Goal status: INITIAL  5.  Walk safely >500 feet with SPC Goal status: INITIAL  PLAN:  PT FREQUENCY: 1-2x/week  PT DURATION: 12 weeks  PLANNED INTERVENTIONS: 97164- PT Re-evaluation, 97110-Therapeutic exercises, 97530- Therapeutic activity, 97112- Neuromuscular re-education, 97535- Self Care, 16109- Manual therapy, [phone]- Gait training, Patient/Family education, Balance training, Stair training, and Vestibular training  PLAN FOR NEXT SESSION: continue with therex to improve righting  reactions, balance, activity tolerance.  Jearld Lesch, PT 10/26/2023, 8:38 AM

## 2023-10-27 ENCOUNTER — Other Ambulatory Visit: Payer: Self-pay

## 2023-10-27 ENCOUNTER — Other Ambulatory Visit (HOSPITAL_COMMUNITY): Payer: Self-pay

## 2023-10-30 ENCOUNTER — Telehealth: Payer: Self-pay

## 2023-10-30 NOTE — Telephone Encounter (Signed)
Pt called back and I gave her the message Britta Mccreedy had left for her. To bring in shoes on Monday at her appt with Dr Carlota Raspberry

## 2023-10-30 NOTE — Telephone Encounter (Signed)
Left vm to tell pt she can bring in shoes on her appt on 1/27 to see tricia after she sees Dr.Dia her shoes are tight so she needs to be adjusted.

## 2023-11-02 ENCOUNTER — Ambulatory Visit (INDEPENDENT_AMBULATORY_CARE_PROVIDER_SITE_OTHER): Payer: PPO | Admitting: Podiatry

## 2023-11-02 ENCOUNTER — Ambulatory Visit: Payer: PPO | Admitting: Physical Therapy

## 2023-11-02 ENCOUNTER — Encounter: Payer: Self-pay | Admitting: Physical Therapy

## 2023-11-02 DIAGNOSIS — M79674 Pain in right toe(s): Secondary | ICD-10-CM | POA: Diagnosis not present

## 2023-11-02 DIAGNOSIS — R262 Difficulty in walking, not elsewhere classified: Secondary | ICD-10-CM

## 2023-11-02 DIAGNOSIS — M6281 Muscle weakness (generalized): Secondary | ICD-10-CM

## 2023-11-02 DIAGNOSIS — B351 Tinea unguium: Secondary | ICD-10-CM | POA: Diagnosis not present

## 2023-11-02 DIAGNOSIS — M79675 Pain in left toe(s): Secondary | ICD-10-CM

## 2023-11-02 DIAGNOSIS — R2681 Unsteadiness on feet: Secondary | ICD-10-CM

## 2023-11-02 NOTE — Therapy (Signed)
OUTPATIENT PHYSICAL THERAPY LOWER EXTREMITY TREATMENT   Patient Name: Cathy Gonzalez MRN: [id number] DOB:Oct 20, 1945, 78 y.o., female Today's Date: 11/02/2023  END OF SESSION:  PT End of Session - 11/02/23 0749     Visit Number 4    Date for PT Re-Evaluation 01/10/24    Authorization Type HTA    PT Start Time 0747    PT Stop Time 0833    PT Time Calculation (min) 46 min    Activity Tolerance Patient tolerated treatment well    Behavior During Therapy [facility] for tasks assessed/performed              Past Medical History:  Diagnosis Date   Arthritis    Cataract    left cataract removed in 2017   Diabetes mellitus    Diabetic neuropathy (HCC)    Diabetic neuropathy, type II diabetes mellitus (HCC)    Hepatitis C    took tx for    Hypertension    Hypokalemia    Knee pain    right    Morbidly obese (HCC)    Nocturnal seizures (HCC) 05/14/2015   no bad seizure since 1980's   OSA on CPAP    uses cpap 2-3 week   Plantar fasciitis    both feet   Past Surgical History:  Procedure Laterality Date   ABDOMINAL HYSTERECTOMY     partial   ANTERIOR CERVICAL DECOMP/DISCECTOMY FUSION N/A 02/24/2022   Procedure: CERVICAL FOUR- CERVICAL FIVE ANTERIOR CERVICAL DECOMPRESSION/DISCECTOMY FUSION ONE LEVEL WITH ALLOGRAFT AND PLATE;  Surgeon: Eldred Manges, MD;  Location: [facility];  Service: Orthopedics;  Laterality: N/A;   CATARACT EXTRACTION Left    COLONOSCOPY WITH PROPOFOL N/A 08/21/2015   Procedure: COLONOSCOPY WITH PROPOFOL;  Surgeon: [name], MD;  Location: [facility];  Service: Endoscopy;  Laterality: N/A;   COLONOSCOPY WITH PROPOFOL N/A 08/22/2022   Procedure: COLONOSCOPY WITH PROPOFOL;  Surgeon: [name], MD;  Location: [facility];  Service: Gastroenterology;  Laterality: N/A;   ESOPHAGOGASTRODUODENOSCOPY (EGD) WITH PROPOFOL N/A 08/21/2015   Procedure: ESOPHAGOGASTRODUODENOSCOPY (EGD) WITH PROPOFOL;  Surgeon: [name], MD;  [facility]: [facility];  Service:  Endoscopy;  Laterality: N/A;   ESOPHAGOGASTRODUODENOSCOPY (EGD) WITH PROPOFOL N/A 10/08/2018   Procedure: ESOPHAGOGASTRODUODENOSCOPY (EGD) WITH PROPOFOL;  Surgeon: [name], MD;  Location: [facility];  Service: Endoscopy;  Laterality: N/A;   ESOPHAGOGASTRODUODENOSCOPY (EGD) WITH PROPOFOL N/A 08/22/2022   Procedure: ESOPHAGOGASTRODUODENOSCOPY (EGD) WITH PROPOFOL;  Surgeon: [name], MD;  Location: [facility];  Service: Gastroenterology;  Laterality: N/A;   EYE SURGERY Bilateral    cataracts   POLYPECTOMY  08/22/2022   Procedure: POLYPECTOMY;  Surgeon: [name], MD;  Location: [facility];  Service: Gastroenterology;;   TUBAL LIGATION     Patient Active Problem List   Diagnosis Date Noted   Gouty neuritis (HCC) 03/16/2023   Paroxysmal A-fib (HCC) 03/16/2023   Pain in right hand 10/28/2022   Impingement syndrome of right shoulder 08/12/2022   S/P cervical spinal fusion 06/25/2022   Pain due to onychomycosis of toenails of both feet 05/06/2022   Cervical stenosis of spine 02/24/2022   Neck pain 12/03/2021   Abnormal liver function tests 02/12/2021   Diarrhea 02/12/2021   Gastro-esophageal reflux disease without esophagitis 02/12/2021   Periumbilical pain 02/12/2021   History of colonic polyps 02/12/2021   Portal hypertension (HCC) 02/12/2021   Screening for malignant neoplasm of colon 02/12/2021   Type 2 diabetes mellitus without complications (HCC) 02/12/2021   Primary osteoarthritis, left hand 11/07/2020  Arthritis of carpometacarpal (CMC) joint of thumb 07/18/2020   Bilateral primary osteoarthritis of knee 06/20/2020   Bilateral primary osteoarthritis of hip 06/07/2020   Right thigh pain 05/09/2020   Low back pain 07/14/2019   OSA on CPAP 05/16/2019   Diabetic neuropathy associated with type 2 diabetes mellitus (HCC) 12/13/2018   Chronic pain of right knee 12/09/2018   Thumb pain, left 12/09/2018   Dizziness and giddiness 01/14/2016   Hepatic cirrhosis  (HCC) 07/26/2015   Nocturnal seizures (HCC) 05/14/2015   Chronic hepatitis C without hepatic coma (HCC) 03/14/2015   Vertigo, central 02/11/2014   Difficulty walking 02/11/2014   Diabetes mellitus (HCC) 02/11/2014   Dysphagia 02/11/2014   Class 3 severe obesity with serious comorbidity and body mass index (BMI) of 40.0 to 44.9 in adult, unspecified obesity type (HCC) 07/26/2010   Essential hypertension, benign 07/26/2010   DEGENERATIVE JOINT DISEASE, GENERALIZED 07/26/2010    PCP: Parke Simmers, MD  REFERRING PROVIDER: Parke Simmers, MD  REFERRING DIAG: unsteadiness with walking  THERAPY DIAG:  Difficulty in walking, not elsewhere classified  Muscle weakness (generalized)  Unsteady gait  Rationale for Evaluation and Treatment: Rehabilitation  ONSET DATE: 09/23/23  SUBJECTIVE:   SUBJECTIVE STATEMENT: Patient reports that she is feeling better, less pain PERTINENT HISTORY: See above PAIN:  Are you having pain? Yes: NPRS scale: 4/10 Pain location: feet and right knee Pain description: feet burn from neuropathy, right knee aches Aggravating factors: walking Relieving factors: rest  PRECAUTIONS: Fall  RED FLAGS: None   WEIGHT BEARING RESTRICTIONS: No  FALLS:  Has patient fallen in last 6 months? 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  DIAGNOSTIC FINDINGS: none  COGNITION: Overall cognitive status: Within functional limits for tasks assessed     SENSATION: Can feel the calves, but has minimal sensation in the feet  EDEMA:   Has some compression stocking  MUSCLE LENGTH: Tight mms in the HS and piriformis  POSTURE: rounded shoulders and forward head  PALPATION: Some tenderness in the calves  LOWER EXTREMITY ROM:  WFL's  LOWER EXTREMITY MMT:  MMT Right eval Left eval  Hip flexion 4 4  Hip extension    Hip abduction    Hip adduction    Hip internal rotation    Hip external rotation    Knee flexion 4 4  Knee extension 4- 4-  Ankle dorsiflexion 4 4-  Ankle plantarflexion    Ankle inversion 4- 4-  Ankle eversion 4- 4-   (Blank rows = not tested) FUNCTIONAL TESTS:  5 times sit to stand: 35 seconds had to use arm rests to push up with hands Timed up and go (TUG): 20 seconds, limp on the right, unsteady reached out and touched the walls and the door frames, 11/02/23 = 13 seconds Berg Balance Scale: 31/56  GAIT: Distance walked: 70 feet Assistive device utilized: None Level of assistance: CGA Comments: tends to use walls , unsteady, limps on the right  TREATMENT DATE:  11/02/23 Nustep level 5 x 7.5 minutes Bike level 2 x 6 minutes Leg curls 20# 3x10 Leg extension 10# 2x10 Standing on airex 5# straight arm pulls CGA due to poor balance Calf stretch On airex ball toss CGA for balance Walking ball toss with CGA TUG 13 seconds Direction changes Cone toe touches with two canes and then with one Fast walk 2 laps with [facility]    10/26/23 Nustep level 5 x 7 minutes Standing red tband row Standing red tband extension Leg curls 20# 2x10 Leg extension 10# 2x10 2.5# marches  2.5# hip abduction Standing on airex some ball toss needed CGA due to poor balance Calf stretches Side stepping in Pbars On airex reaching for numbers on wall On airex head turns Light HHA fast walk 2 laps, HR up to 130, recovery in less than 2 minutes, then did one more lap Cone toe touches with  SPC and HHA  10/19/23:  Nustep L5 Ue's and LE's x 5 min 30 sec Patient demonstrated her home program: performed 20 reps each: Seated long arc quads,  Seated heel lifts,  Seated ankle dorsiflexion Seated march Instructed in seated ankle plantarflexor stretch and hamstring stretch with strap Inst in standing heel drops off black bar, alt, 10 sec holds Standing in ll bars for heel/toe rocks with B hand hold 15 x Standing for penguins alt 15 x , B hand hold support in ll bars   10/12/23 Nustep level 4 x 5 minutes      PATIENT EDUCATION:  Education details: POC/HEP Person educated: Patient Education method: Programmer, multimedia, Facilities manager, Verbal cues, and Handouts Education comprehension: verbalized understanding  HOME EXERCISE PROGRAM: Access Code: [email or web address] URL: [email or web address] Date: 10/12/2023 Prepared by: Stacie Glaze  Exercises - Seated March  - 1 x daily - 7 x weekly - 2 sets - 10 reps - 3 hold - Seated Long Arc Quad  - 1 x daily - 7 x weekly - 2 sets - 10 reps - 3 hold - Seated Toe Raise  - 1 x daily - 7 x weekly - 2 sets - 10 reps - 30 hold - Seated Heel Raise  - 1 x daily - 7 x weekly - 2 sets - 10 reps - 3 hold  ASSESSMENT:  CLINICAL IMPRESSION: Patient is a 78 y.o. female who participated with physical therapy treatment for weakness, leg pain and unsteady gait. I continued to add exercises for strength, function, endurance and balance, she really struggles with balance on and off dynamic surfaces,  She is overall improving and decreased her TUG by 7 seconds  OBJECTIVE IMPAIRMENTS: Abnormal gait, cardiopulmonary status limiting activity, decreased activity tolerance, decreased balance, decreased coordination, decreased endurance, decreased mobility, difficulty walking, decreased ROM, decreased strength, decreased safety awareness, impaired flexibility, and pain.   REHAB POTENTIAL: Good  CLINICAL DECISION MAKING: Stable/uncomplicated  EVALUATION  COMPLEXITY: Low   GOALS: Goals reviewed with patient? Yes  SHORT TERM GOALS: Target date: 11/01/23 Independent with initial HEP Baseline: not exercising Goal status:met 10/26/23   LONG TERM GOALS: Target date: 12/10/23  Independent with advanced HEP Goal status: INITIAL  2.  Decrease TUG to 13 seconds Baseline:  20 seconds Goal status: progressing 11/02/23  3.  Decrease 5XSTS to 21 seconds Baseline: 35 seconds with using hand to push up from armrest Goal status: progressing 11/02/23  4.  Increase Berg balance test score to 46/56 Baseline: 31/56 Goal status: INITIAL  5.  Walk safely >500 feet with SPC Goal status: INITIAL  PLAN:  PT FREQUENCY: 1-2x/week  PT DURATION: 12 weeks  PLANNED INTERVENTIONS: 97164- PT Re-evaluation, 97110-Therapeutic exercises, 97530- Therapeutic activity, [id number]- Neuromuscular re-education, 97535- Self Care, 14782- Manual therapy, [phone]- Gait training, Patient/Family education, Balance training, Stair training, and Vestibular training  PLAN FOR NEXT SESSION: continue with therex to improve righting reactions, balance, activity tolerance.  Jearld Lesch, PT 11/02/2023, 7:50 AM

## 2023-11-02 NOTE — Progress Notes (Signed)
Subjective:  Patient ID: Cathy Gonzalez, female    DOB: [birth year],  MRN: [id number]   Cathy Gonzalez presents to clinic today for:  Chief Complaint  Patient presents with   Diabetes    DFC A1C - 6.4    Patient notes nails are thick, discolored, elongated and painful in shoegear when trying to ambulate.  Patient notes that she brought her diabetic shoes back today.  She feels that the shoes are not a good fit.  She needs to speak with the pedorthist about a possible exchange.  PCP is Renaye Rakers, MD.  Past Medical History:  Diagnosis Date   Arthritis    Cataract    left cataract removed in 2017   Diabetes mellitus    Diabetic neuropathy (HCC)    Diabetic neuropathy, type II diabetes mellitus (HCC)    Hepatitis C    took tx for    Hypertension    Hypokalemia    Knee pain    right    Morbidly obese (HCC)    Nocturnal seizures (HCC) 05/14/2015   no bad seizure since 1980's   OSA on CPAP    uses cpap 2-3 week   Plantar fasciitis    both feet    Past Surgical History:  Procedure Laterality Date   ABDOMINAL HYSTERECTOMY     partial   ANTERIOR CERVICAL DECOMP/DISCECTOMY FUSION N/A 02/24/2022   Procedure: CERVICAL FOUR- CERVICAL FIVE ANTERIOR CERVICAL DECOMPRESSION/DISCECTOMY FUSION ONE LEVEL WITH ALLOGRAFT AND PLATE;  Surgeon: Eldred Manges, MD;  Location: [facility];  Service: Orthopedics;  Laterality: N/A;   CATARACT EXTRACTION Left    COLONOSCOPY WITH PROPOFOL N/A 08/21/2015   Procedure: COLONOSCOPY WITH PROPOFOL;  Surgeon: [name], MD;  Location: [facility];  Service: Endoscopy;  Laterality: N/A;   COLONOSCOPY WITH PROPOFOL N/A 08/22/2022   Procedure: COLONOSCOPY WITH PROPOFOL;  Surgeon: [name], MD;  Location: [facility];  Service: Gastroenterology;  Laterality: N/A;   ESOPHAGOGASTRODUODENOSCOPY (EGD) WITH PROPOFOL N/A 08/21/2015   Procedure: ESOPHAGOGASTRODUODENOSCOPY (EGD) WITH PROPOFOL;  Surgeon: [name], MD;  [facility]: [facility];  Service:  Endoscopy;  Laterality: N/A;   ESOPHAGOGASTRODUODENOSCOPY (EGD) WITH PROPOFOL N/A 10/08/2018   Procedure: ESOPHAGOGASTRODUODENOSCOPY (EGD) WITH PROPOFOL;  Surgeon: [name], MD;  Location: [facility];  Service: Endoscopy;  Laterality: N/A;   ESOPHAGOGASTRODUODENOSCOPY (EGD) WITH PROPOFOL N/A 08/22/2022   Procedure: ESOPHAGOGASTRODUODENOSCOPY (EGD) WITH PROPOFOL;  Surgeon: [name], MD;  [facility]: [facility];  Service: Gastroenterology;  Laterality: N/A;   EYE SURGERY Bilateral    cataracts   POLYPECTOMY  08/22/2022   Procedure: POLYPECTOMY;  Surgeon: [name], MD;  [facility]: [facility];  Service: Gastroenterology;;   TUBAL LIGATION      Allergies  Allergen Reactions   Acetaminophen     Other reaction(s): GI upset, constipation if taken regularly    Review of Systems: Negative except as noted in the HPI.  Objective:  Cathy Gonzalez is a pleasant 78 y.o. female in NAD. AAO x 3.  Vascular Examination: Capillary refill time is 3-5 seconds to toes bilateral. Palpable pedal pulses b/l LE. Digital hair sparse b/l.  Skin temperature gradient WNL b/l. No varicosities b/l. No cyanosis noted b/l.  +1 pitting edema bilateral  Dermatological Examination: Pedal skin with normal turgor, texture and tone b/l. No open wounds. No interdigital macerations b/l. Toenails x10 are 3mm thick, discolored, dystrophic with subungual debris. There is pain with compression of the nail plates.  They are elongated x10  Assessment/Plan: 1. Pain due to onychomycosis of toenails of both feet    The mycotic toenails were sharply debrided x10 with sterile nail nippers and a power debriding burr to decrease bulk/thickness and length.    Return in about 3 months (around 01/31/2024) for [facility].   Clerance Lav, DPM, FACFAS [facility]     [address] [place], [place] [address]                Office [phone]  Fax [phone]

## 2023-11-03 ENCOUNTER — Ambulatory Visit: Payer: PPO | Admitting: Physical Medicine and Rehabilitation

## 2023-11-03 ENCOUNTER — Other Ambulatory Visit: Payer: Self-pay

## 2023-11-03 DIAGNOSIS — M5416 Radiculopathy, lumbar region: Secondary | ICD-10-CM

## 2023-11-03 MED ORDER — METHYLPREDNISOLONE ACETATE 40 MG/ML IJ SUSP
40.0000 mg | Freq: Once | INTRAMUSCULAR | Status: AC
Start: 2023-11-03 — End: 2023-11-03
  Administered 2023-11-03: 40 mg

## 2023-11-03 NOTE — Progress Notes (Signed)
Functional Pain Scale - descriptive words and definitions  No Pain (0)   No Pain/Loss of function  Average Pain 2 No pain today she is getting a shot because sometimes she has flair ups.   +Driver, -BT, -Dye Allergies.

## 2023-11-03 NOTE — Procedures (Signed)
Lumbar Epidural Steroid Injection - Interlaminar Approach with Fluoroscopic Guidance  Patient: Cathy Gonzalez      Date of Birth: May 06, 1946 MRN: [id number] PCP: Renaye Rakers, MD      Visit Date: 11/03/2023   Universal Protocol:     Consent Given By: the patient  Position: PRONE  Additional Comments: Vital signs were monitored before and after the procedure. Patient was prepped and draped in the usual sterile fashion. The correct patient, procedure, and site was verified.   Injection Procedure Details:   Procedure diagnoses: Lumbar radiculopathy [M54.16]   Meds Administered:  Meds ordered this encounter  Medications   methylPREDNISolone acetate (DEPO-MEDROL) injection 40 mg     Laterality: Right  Location/Site:  L4-5  Needle: 4.5 in., 20 ga. Tuohy  Needle Placement: Paramedian epidural  Findings:   -Comments: Excellent flow of contrast into the epidural space.  Procedure Details: Using a paramedian approach from the side mentioned above, the region overlying the inferior lamina was localized under fluoroscopic visualization and the soft tissues overlying this structure were infiltrated with 4 ml. of 1% Lidocaine without Epinephrine. The Tuohy needle was inserted into the epidural space using a paramedian approach.   The epidural space was localized using loss of resistance along with counter oblique bi-planar fluoroscopic views.  After negative aspirate for air, blood, and CSF, a 2 ml. volume of Isovue-250 was injected into the epidural space and the flow of contrast was observed. Radiographs were obtained for documentation purposes.    The injectate was administered into the level noted above.   Additional Comments:  No complications occurred Dressing: 2 x 2 sterile gauze and Band-Aid    Post-procedure details: Patient was observed during the procedure. Post-procedure instructions were reviewed.  Patient left the clinic in stable condition.

## 2023-11-03 NOTE — Progress Notes (Signed)
Cathy Gonzalez - 78 y.o. female MRN [id number]  Date of birth: 02-23-1946  Office Visit Note: Visit Date: 11/03/2023 PCP: Renaye Rakers, MD Referred by: Renaye Rakers, MD  Subjective: Chief Complaint  Patient presents with   Lower Back - Pain   HPI:  Cathy Gonzalez is a 78 y.o. female who comes in today at the request of Dr. Annell Greening for planned Right L4-5 Lumbar Interlaminar epidural steroid injection with fluoroscopic guidance.  The patient has failed conservative care including home exercise, medications, time and activity modification.  This injection will be diagnostic and hopefully therapeutic.  Please see requesting physician notes for further details and justification.   ROS Otherwise per HPI.  Assessment & Plan: Visit Diagnoses:    ICD-10-CM   1. Lumbar radiculopathy  M54.16 XR C-ARM NO REPORT    Epidural Steroid injection    methylPREDNISolone acetate (DEPO-MEDROL) injection 40 mg      Plan: No additional findings.   Meds & Orders:  Meds ordered this encounter  Medications   methylPREDNISolone acetate (DEPO-MEDROL) injection 40 mg    Orders Placed This Encounter  Procedures   XR C-ARM NO REPORT   Epidural Steroid injection    Follow-up: Return if symptoms worsen or fail to improve.   Procedures: No procedures performed  Lumbar Epidural Steroid Injection - Interlaminar Approach with Fluoroscopic Guidance  Patient: Cathy Gonzalez      Date of Birth: 12/06/1945 MRN: [id number] PCP: Renaye Rakers, MD      Visit Date: 11/03/2023   Universal Protocol:     Consent Given By: the patient  Position: PRONE  Additional Comments: Vital signs were monitored before and after the procedure. Patient was prepped and draped in the usual sterile fashion. The correct patient, procedure, and site was verified.   Injection Procedure Details:   Procedure diagnoses: Lumbar radiculopathy [M54.16]   Meds Administered:  Meds ordered this encounter  Medications    methylPREDNISolone acetate (DEPO-MEDROL) injection 40 mg     Laterality: Right  Location/Site:  L4-5  Needle: 4.5 in., 20 ga. Tuohy  Needle Placement: Paramedian epidural  Findings:   -Comments: Excellent flow of contrast into the epidural space.  Procedure Details: Using a paramedian approach from the side mentioned above, the region overlying the inferior lamina was localized under fluoroscopic visualization and the soft tissues overlying this structure were infiltrated with 4 ml. of 1% Lidocaine without Epinephrine. The Tuohy needle was inserted into the epidural space using a paramedian approach.   The epidural space was localized using loss of resistance along with counter oblique bi-planar fluoroscopic views.  After negative aspirate for air, blood, and CSF, a 2 ml. volume of Isovue-250 was injected into the epidural space and the flow of contrast was observed. Radiographs were obtained for documentation purposes.    The injectate was administered into the level noted above.   Additional Comments:  No complications occurred Dressing: 2 x 2 sterile gauze and Band-Aid    Post-procedure details: Patient was observed during the procedure. Post-procedure instructions were reviewed.  Patient left the clinic in stable condition.   Clinical History: Narrative & Impression CLINICAL DATA:  Low back pain. RIGHT leg pain and weakness increasing for 1 year.   EXAM: MRI LUMBAR SPINE WITHOUT CONTRAST   TECHNIQUE: Multiplanar, multisequence MR imaging of the lumbar spine was performed. No intravenous contrast was administered.   COMPARISON:  MRI lumbar spine 04/22/2011. Lumbosacral radiographs 12/11/2016.   FINDINGS: Segmentation:  Standard   Alignment:  Physiologic.   Vertebrae:  No fracture, evidence of discitis, or bone lesion.   Conus medullaris: Extends to the L1 level and appears normal.   Paraspinal and other soft tissues: Unremarkable.   Disc levels:    L1-L2: Central and leftward protrusion. Mild facet arthropathy. No definite impingement.   L2-L3: Central extrusion. Posterior element hypertrophy. Mild stenosis. BILATERAL L3 nerve root impingement. No significant foraminal narrowing.   L3-L4: Central and rightward protrusion. Foraminal disc material extends into the extraforaminal compartment on the RIGHT. Posterior element hypertrophy. Moderate stenosis. RIGHT L3 and BILATERAL L4 nerve root impingement.   L4-L5: Central protrusion. Advanced posterior element hypertrophy. Moderate to severe stenosis. Foraminal and extraforaminal disc material to the LEFT. LEFT greater than RIGHT L4 and L5 nerve root impingement.   L5-S1: Central extrusion. Posterior element hypertrophy. Disc material extends into both foramina. Mild stenosis. BILATERAL L5 and S1 nerve root impingement.   Compared with 2012, there is worsening at all levels except L1-2, which appears improved since that time.   IMPRESSION: Advanced multilevel spondylosis, most pronounced from L2 through S1. Combination of disc herniations and posterior element hypertrophy results in potentially symptomatic neural impingement at L2-3, L3-4, L4-5, and L5-S1.     Electronically Signed   By: Elsie Stain M.D.   On: 12/28/2016 11:12     Objective:  VS:  HT:    WT:   BMI:     BP:   HR: bpm  TEMP: ( )  RESP:  Physical Exam Vitals and nursing note reviewed.  Constitutional:      General: She is not in acute distress.    Appearance: Normal appearance. She is not ill-appearing.  HENT:     Head: Normocephalic and atraumatic.     Right Ear: External ear normal.     Left Ear: External ear normal.  Eyes:     Extraocular Movements: Extraocular movements intact.  Cardiovascular:     Rate and Rhythm: Normal rate.     Pulses: Normal pulses.  Pulmonary:     Effort: Pulmonary effort is normal. No respiratory distress.  Abdominal:     General: There is no distension.      Palpations: Abdomen is soft.  Musculoskeletal:        General: Tenderness present.     Cervical back: Neck supple.     Right lower leg: No edema.     Left lower leg: No edema.     Comments: Patient has good distal strength with no pain over the greater trochanters.  No clonus or focal weakness.  Skin:    Findings: No erythema, lesion or rash.  Neurological:     General: No focal deficit present.     Mental Status: She is alert and oriented to person, place, and time.     Sensory: No sensory deficit.     Motor: No weakness or abnormal muscle tone.     Coordination: Coordination normal.  Psychiatric:        Mood and Affect: Mood normal.        Behavior: Behavior normal.      Imaging: No results found.

## 2023-11-03 NOTE — Patient Instructions (Signed)

## 2023-11-09 ENCOUNTER — Ambulatory Visit: Payer: PPO | Attending: Family Medicine

## 2023-11-09 ENCOUNTER — Other Ambulatory Visit: Payer: Self-pay

## 2023-11-09 DIAGNOSIS — R2681 Unsteadiness on feet: Secondary | ICD-10-CM | POA: Diagnosis not present

## 2023-11-09 DIAGNOSIS — R262 Difficulty in walking, not elsewhere classified: Secondary | ICD-10-CM | POA: Diagnosis not present

## 2023-11-09 DIAGNOSIS — M6281 Muscle weakness (generalized): Secondary | ICD-10-CM | POA: Diagnosis not present

## 2023-11-09 NOTE — Therapy (Signed)
OUTPATIENT PHYSICAL THERAPY LOWER EXTREMITY TREATMENT   Patient Name: Cathy Gonzalez MRN: [id number] DOB:[birth year], 78 y.o., female Today's Date: 11/09/2023  END OF SESSION:  PT End of Session - 11/09/23 0816     Visit Number 5    Date for PT Re-Evaluation 01/10/24    Authorization Type HTA    Progress Note Due on Visit 10    PT Start Time 0803    PT Stop Time [phone]    PT Time Calculation (min) 40 min    Activity Tolerance Patient tolerated treatment well    Behavior During Therapy [facility] for tasks assessed/performed               Past Medical History:  Diagnosis Date   Arthritis    Cataract    left cataract removed in 2017   Diabetes mellitus    Diabetic neuropathy (HCC)    Diabetic neuropathy, type II diabetes mellitus (HCC)    Hepatitis C    took tx for    Hypertension    Hypokalemia    Knee pain    right    Morbidly obese (HCC)    Nocturnal seizures (HCC) 05/14/2015   no bad seizure since 1980's   OSA on CPAP    uses cpap 2-3 week   Plantar fasciitis    both feet   Past Surgical History:  Procedure Laterality Date   ABDOMINAL HYSTERECTOMY     partial   ANTERIOR CERVICAL DECOMP/DISCECTOMY FUSION N/A 02/24/2022   Procedure: CERVICAL FOUR- CERVICAL FIVE ANTERIOR CERVICAL DECOMPRESSION/DISCECTOMY FUSION ONE LEVEL WITH ALLOGRAFT AND PLATE;  Surgeon: Eldred Manges, MD;  Location: [facility];  Service: Orthopedics;  Laterality: N/A;   CATARACT EXTRACTION Left    COLONOSCOPY WITH PROPOFOL N/A 08/21/2015   Procedure: COLONOSCOPY WITH PROPOFOL;  Surgeon: [name], MD;  Location: [facility];  Service: Endoscopy;  Laterality: N/A;   COLONOSCOPY WITH PROPOFOL N/A 08/22/2022   Procedure: COLONOSCOPY WITH PROPOFOL;  Surgeon: [name], MD;  Location: [facility];  Service: Gastroenterology;  Laterality: N/A;   ESOPHAGOGASTRODUODENOSCOPY (EGD) WITH PROPOFOL N/A 08/21/2015   Procedure: ESOPHAGOGASTRODUODENOSCOPY (EGD) WITH PROPOFOL;  Surgeon: [name], MD;   Location: [facility];  Service: Endoscopy;  Laterality: N/A;   ESOPHAGOGASTRODUODENOSCOPY (EGD) WITH PROPOFOL N/A 10/08/2018   Procedure: ESOPHAGOGASTRODUODENOSCOPY (EGD) WITH PROPOFOL;  Surgeon: [name], MD;  Location: [facility];  Service: Endoscopy;  Laterality: N/A;   ESOPHAGOGASTRODUODENOSCOPY (EGD) WITH PROPOFOL N/A 08/22/2022   Procedure: ESOPHAGOGASTRODUODENOSCOPY (EGD) WITH PROPOFOL;  Surgeon: [name], MD;  [facility]: [facility];  Service: Gastroenterology;  Laterality: N/A;   EYE SURGERY Bilateral    cataracts   POLYPECTOMY  08/22/2022   Procedure: POLYPECTOMY;  Surgeon: [name], MD;  Location: [facility];  Service: Gastroenterology;;   TUBAL LIGATION     Patient Active Problem List   Diagnosis Date Noted   Gouty neuritis (HCC) 03/16/2023   Paroxysmal A-fib (HCC) 03/16/2023   Pain in right hand 10/28/2022   Impingement syndrome of right shoulder 08/12/2022   S/P cervical spinal fusion 06/25/2022   Pain due to onychomycosis of toenails of both feet 05/06/2022   Cervical stenosis of spine 02/24/2022   Neck pain 12/03/2021   Abnormal liver function tests 02/12/2021   Diarrhea 02/12/2021   Gastro-esophageal reflux disease without esophagitis 02/12/2021   Periumbilical pain 02/12/2021   History of colonic polyps 02/12/2021   Portal hypertension (HCC) 02/12/2021   Screening for malignant neoplasm of colon 02/12/2021   Type 2 diabetes mellitus without  complications (HCC) 02/12/2021   Primary osteoarthritis, left hand 11/07/2020   Arthritis of carpometacarpal (CMC) joint of thumb 07/18/2020   Bilateral primary osteoarthritis of knee 06/20/2020   Bilateral primary osteoarthritis of hip 06/07/2020   Right thigh pain 05/09/2020   Low back pain 07/14/2019   OSA on CPAP 05/16/2019   Diabetic neuropathy associated with type 2 diabetes mellitus (HCC) 12/13/2018   Chronic pain of right knee 12/09/2018   Thumb pain, left 12/09/2018   Dizziness and giddiness  01/14/2016   Hepatic cirrhosis (HCC) 07/26/2015   Nocturnal seizures (HCC) 05/14/2015   Chronic hepatitis C without hepatic coma (HCC) 03/14/2015   Vertigo, central 02/11/2014   Difficulty walking 02/11/2014   Diabetes mellitus (HCC) 02/11/2014   Dysphagia 02/11/2014   Class 3 severe obesity with serious comorbidity and body mass index (BMI) of 40.0 to 44.9 in adult, unspecified obesity type (HCC) 07/26/2010   Essential hypertension, benign 07/26/2010   DEGENERATIVE JOINT DISEASE, GENERALIZED 07/26/2010    PCP: Parke Simmers, MD  REFERRING PROVIDER: Parke Simmers, MD  REFERRING DIAG: unsteadiness with walking  THERAPY DIAG:  Difficulty in walking, not elsewhere classified  Muscle weakness (generalized)  Unsteady gait  Rationale for Evaluation and Treatment: Rehabilitation  ONSET DATE: 09/23/23  SUBJECTIVE:   SUBJECTIVE STATEMENT: Patient reports that she is walking much better, better balance PERTINENT HISTORY: See above PAIN:  Are you having pain? Yes: NPRS scale: 4/10 Pain location: feet and right knee Pain description: feet burn from neuropathy, right knee aches Aggravating factors: walking Relieving factors: rest  PRECAUTIONS: Fall  RED FLAGS: None   WEIGHT BEARING RESTRICTIONS: No  FALLS:  Has patient fallen in last 6 months? 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  DIAGNOSTIC FINDINGS: none  COGNITION: Overall cognitive status: Within functional limits for tasks assessed     SENSATION: Can feel the calves, but has  minimal sensation in the feet  EDEMA:  Has some compression stocking  MUSCLE LENGTH: Tight mms in the HS and piriformis  POSTURE: rounded shoulders and forward head  PALPATION: Some tenderness in the calves  LOWER EXTREMITY ROM:  WFL's  LOWER EXTREMITY MMT:  MMT Right eval Left eval  Hip flexion 4 4  Hip extension    Hip abduction    Hip adduction    Hip internal rotation    Hip external rotation    Knee flexion 4 4  Knee extension 4- 4-  Ankle dorsiflexion 4 4-  Ankle plantarflexion    Ankle inversion 4- 4-  Ankle eversion 4- 4-   (Blank rows = not tested) FUNCTIONAL TESTS:  5 times sit to stand: 35 seconds had to use arm rests to push up with hands Timed up and go (TUG): 20 seconds, limp on the right, unsteady reached out and touched the walls and the door frames, 11/02/23 = 13 seconds Berg Balance Scale: 31/56  GAIT: Distance walked: 70 feet Assistive device utilized: None Level of assistance: CGA Comments: tends to use walls , unsteady, limps on the right  TREATMENT DATE:  11/09/23: Recumbent bike 1.5, x 3 min 30 sec  Seated hamstring curls 20#, 3 x 10  Seated B knee ext 10# 3 x 10 Standing in ll bars with 2# cuff wts, alt hip abd, alt marching, alt hip and knee ext, 10 reps each Fast walking with HHA x 2 laps In ll bars on airex for heel toe rocks 10 x  In ll bars for step backs , lead foot on airex, 10 x each Nustep level 4 x 6 min  11/02/23 Nustep level 5 x 7.5 minutes Bike level 2 x 6 minutes Leg curls 20# 3x10 Leg extension 10# 2x10 Standing on airex 5# straight arm pulls CGA due to poor balance Calf stretch On airex ball toss CGA for balance Walking ball toss with CGA TUG 13 seconds Direction changes Cone toe touches with two canes and then with one Fast walk 2 laps with [facility]    10/26/23 Nustep level 5 x 7  minutes Standing red tband row Standing red tband extension Leg curls 20# 2x10 Leg extension 10# 2x10 2.5# marches  2.5# hip abduction Standing on airex some ball toss needed CGA due to poor balance Calf stretches Side stepping in Pbars On airex reaching for numbers on wall On airex head turns Light HHA fast walk 2 laps, HR up to 130, recovery in less than 2 minutes, then did one more lap Cone toe touches with SPC and HHA  10/19/23:  Nustep L5 Ue's and LE's x 5 min 30 sec Patient demonstrated her home program: performed 20 reps each: Seated long arc quads,  Seated heel lifts,  Seated ankle dorsiflexion Seated march Instructed in seated ankle plantarflexor stretch and hamstring stretch with strap Inst in standing heel drops off black bar, alt, 10 sec holds Standing in ll bars for heel/toe rocks with B hand hold 15 x Standing for penguins alt 15 x , B hand hold support in ll bars   10/12/23 Nustep level 4 x 5 minutes      PATIENT EDUCATION:  Education details: POC/HEP Person educated: Patient Education method: Programmer, multimedia, Facilities manager, Verbal cues, and Handouts Education comprehension: verbalized understanding  HOME EXERCISE PROGRAM: Access Code: [email or web address] URL: [email or web address] Date: 10/12/2023 Prepared by: Stacie Glaze  Exercises - Seated March  - 1 x daily - 7 x weekly - 2 sets - 10 reps - 3 hold - Seated Long Arc Quad  - 1 x daily - 7 x weekly - 2 sets - 10 reps - 3 hold - Seated Toe Raise  - 1 x daily - 7 x weekly - 2 sets - 10 reps - 30 hold - Seated Heel Raise  - 1 x daily - 7 x weekly - 2 sets - 10 reps - 3 hold  ASSESSMENT:  CLINICAL IMPRESSION: Patient is a 78 y.o. female who participated with physical therapy treatment for weakness, leg pain and unsteady gait. She is improving overall, tends to lock knees to maintain stability with dynamic balance challenges.  Also fatigues with seated rest breaks needed several times.  Overall she  reports improved function in her daily ADL's   OBJECTIVE IMPAIRMENTS: Abnormal gait, cardiopulmonary status limiting activity, decreased activity tolerance, decreased balance, decreased coordination, decreased endurance, decreased mobility, difficulty walking, decreased ROM, decreased strength, decreased safety awareness, impaired flexibility, and pain.   REHAB POTENTIAL: Good  CLINICAL DECISION MAKING: Stable/uncomplicated  EVALUATION COMPLEXITY: Low   GOALS: Goals reviewed with patient? Yes  SHORT TERM GOALS: Target date: 11/01/23 Independent with initial  HEP Baseline: not exercising Goal status:met 10/26/23   LONG TERM GOALS: Target date: 12/10/23  Independent with advanced HEP Goal status: INITIAL  2.  Decrease TUG to 13 seconds Baseline:  20 seconds Goal status: progressing 11/02/23  3.  Decrease 5XSTS to 21 seconds Baseline: 35 seconds with using hand to push up from armrest Goal status: progressing 11/02/23  4.  Increase Berg balance test score to 46/56 Baseline: 31/56 Goal status: INITIAL  5.  Walk safely >500 feet with SPC Goal status: INITIAL  PLAN:  PT FREQUENCY: 1-2x/week  PT DURATION: 12 weeks  PLANNED INTERVENTIONS: 97164- PT Re-evaluation, 97110-Therapeutic exercises, 97530- Therapeutic activity, 97112- Neuromuscular re-education, 97535- Self Care, 91478- Manual therapy, [phone]- Gait training, Patient/Family education, Balance training, Stair training, and Vestibular training  PLAN FOR NEXT SESSION: continue with therex to improve righting reactions, balance, activity tolerance.  Mehul Rudin L Kamarah Bilotta, PT 11/09/2023, 8:40 AM

## 2023-11-12 ENCOUNTER — Ambulatory Visit: Payer: PPO

## 2023-11-12 NOTE — Progress Notes (Signed)
 Patient was here and was fit with new shoes  Shoes fit well patient is happy with exchange and will call if any other problems arise

## 2023-11-13 ENCOUNTER — Other Ambulatory Visit (HOSPITAL_COMMUNITY): Payer: Self-pay

## 2023-11-16 ENCOUNTER — Other Ambulatory Visit (HOSPITAL_COMMUNITY): Payer: Self-pay

## 2023-11-18 ENCOUNTER — Other Ambulatory Visit: Payer: Self-pay

## 2023-11-23 ENCOUNTER — Other Ambulatory Visit: Payer: Self-pay

## 2023-11-23 ENCOUNTER — Ambulatory Visit: Payer: PPO

## 2023-11-23 ENCOUNTER — Other Ambulatory Visit (HOSPITAL_COMMUNITY): Payer: Self-pay

## 2023-11-23 DIAGNOSIS — M6281 Muscle weakness (generalized): Secondary | ICD-10-CM

## 2023-11-23 DIAGNOSIS — R2681 Unsteadiness on feet: Secondary | ICD-10-CM

## 2023-11-23 DIAGNOSIS — R262 Difficulty in walking, not elsewhere classified: Secondary | ICD-10-CM | POA: Diagnosis not present

## 2023-11-23 NOTE — Therapy (Signed)
 OUTPATIENT PHYSICAL THERAPY LOWER EXTREMITY TREATMENT   Patient Name: BEVERLEY ALLENDER MRN: [id number] DOB:06-Apr-1946, 78 y.o., female Today's Date: 11/23/2023  END OF SESSION:  PT End of Session - 11/23/23 0806     Visit Number 6    Date for PT Re-Evaluation 01/10/24    Authorization Type HTA    Progress Note Due on Visit 10    PT Start Time 0803    PT Stop Time 0845    PT Time Calculation (min) 42 min    Activity Tolerance Patient tolerated treatment well    Behavior During Therapy [facility] for tasks assessed/performed                Past Medical History:  Diagnosis Date   Arthritis    Cataract    left cataract removed in 2017   Diabetes mellitus    Diabetic neuropathy (HCC)    Diabetic neuropathy, type II diabetes mellitus (HCC)    Hepatitis C    took tx for    Hypertension    Hypokalemia    Knee pain    right    Morbidly obese (HCC)    Nocturnal seizures (HCC) 05/14/2015   no bad seizure since 1980's   OSA on CPAP    uses cpap 2-3 week   Plantar fasciitis    both feet   Past Surgical History:  Procedure Laterality Date   ABDOMINAL HYSTERECTOMY     partial   ANTERIOR CERVICAL DECOMP/DISCECTOMY FUSION N/A 02/24/2022   Procedure: CERVICAL FOUR- CERVICAL FIVE ANTERIOR CERVICAL DECOMPRESSION/DISCECTOMY FUSION ONE LEVEL WITH ALLOGRAFT AND PLATE;  Surgeon: Eldred Manges, MD;  Location: [facility];  Service: Orthopedics;  Laterality: N/A;   CATARACT EXTRACTION Left    COLONOSCOPY WITH PROPOFOL N/A 08/21/2015   Procedure: COLONOSCOPY WITH PROPOFOL;  Surgeon: [name], MD;  Location: [facility];  Service: Endoscopy;  Laterality: N/A;   COLONOSCOPY WITH PROPOFOL N/A 08/22/2022   Procedure: COLONOSCOPY WITH PROPOFOL;  Surgeon: [name], MD;  Location: [facility];  Service: Gastroenterology;  Laterality: N/A;   ESOPHAGOGASTRODUODENOSCOPY (EGD) WITH PROPOFOL N/A 08/21/2015   Procedure: ESOPHAGOGASTRODUODENOSCOPY (EGD) WITH PROPOFOL;  Surgeon: [name], MD;   Location: [facility];  Service: Endoscopy;  Laterality: N/A;   ESOPHAGOGASTRODUODENOSCOPY (EGD) WITH PROPOFOL N/A 10/08/2018   Procedure: ESOPHAGOGASTRODUODENOSCOPY (EGD) WITH PROPOFOL;  Surgeon: [name], MD;  Location: [facility];  Service: Endoscopy;  Laterality: N/A;   ESOPHAGOGASTRODUODENOSCOPY (EGD) WITH PROPOFOL N/A 08/22/2022   Procedure: ESOPHAGOGASTRODUODENOSCOPY (EGD) WITH PROPOFOL;  Surgeon: [name], MD;  Location: [facility];  Service: Gastroenterology;  Laterality: N/A;   EYE SURGERY Bilateral    cataracts   POLYPECTOMY  08/22/2022   Procedure: POLYPECTOMY;  Surgeon: [name], MD;  Location: [facility];  Service: Gastroenterology;;   TUBAL LIGATION     Patient Active Problem List   Diagnosis Date Noted   Gouty neuritis (HCC) 03/16/2023   Paroxysmal A-fib (HCC) 03/16/2023   Pain in right hand 10/28/2022   Impingement syndrome of right shoulder 08/12/2022   S/P cervical spinal fusion 06/25/2022   Pain due to onychomycosis of toenails of both feet 05/06/2022   Cervical stenosis of spine 02/24/2022   Neck pain 12/03/2021   Abnormal liver function tests 02/12/2021   Diarrhea 02/12/2021   Gastro-esophageal reflux disease without esophagitis 02/12/2021   Periumbilical pain 02/12/2021   History of colonic polyps 02/12/2021   Portal hypertension (HCC) 02/12/2021   Screening for malignant neoplasm of colon 02/12/2021   Type 2 diabetes mellitus  without complications (HCC) 02/12/2021   Primary osteoarthritis, left hand 11/07/2020   Arthritis of carpometacarpal (CMC) joint of thumb 07/18/2020   Bilateral primary osteoarthritis of knee 06/20/2020   Bilateral primary osteoarthritis of hip 06/07/2020   Right thigh pain 05/09/2020   Low back pain 07/14/2019   OSA on CPAP 05/16/2019   Diabetic neuropathy associated with type 2 diabetes mellitus (HCC) 12/13/2018   Chronic pain of right knee 12/09/2018   Thumb pain, left 12/09/2018   Dizziness and giddiness  01/14/2016   Hepatic cirrhosis (HCC) 07/26/2015   Nocturnal seizures (HCC) 05/14/2015   Chronic hepatitis C without hepatic coma (HCC) 03/14/2015   Vertigo, central 02/11/2014   Difficulty walking 02/11/2014   Diabetes mellitus (HCC) 02/11/2014   Dysphagia 02/11/2014   Class 3 severe obesity with serious comorbidity and body mass index (BMI) of 40.0 to 44.9 in adult, unspecified obesity type (HCC) 07/26/2010   Essential hypertension, benign 07/26/2010   DEGENERATIVE JOINT DISEASE, GENERALIZED 07/26/2010    PCP: Parke Simmers, MD  REFERRING PROVIDER: Parke Simmers, MD  REFERRING DIAG: unsteadiness with walking  THERAPY DIAG:  Difficulty in walking, not elsewhere classified  Muscle weakness (generalized)  Unsteady gait  Rationale for Evaluation and Treatment: Rehabilitation  ONSET DATE: 09/23/23  SUBJECTIVE:   SUBJECTIVE STATEMENT: Patient reports that she wasn't scheduled for last week but she enjoyed the break.  Still reports improved gait, balance, activity tolerance  PERTINENT HISTORY: See above PAIN:  Are you having pain? Yes: NPRS scale: 4/10 Pain location: feet and right knee Pain description: feet burn from neuropathy, right knee aches Aggravating factors: walking Relieving factors: rest  PRECAUTIONS: Fall  RED FLAGS: None   WEIGHT BEARING RESTRICTIONS: No  FALLS:  Has patient fallen in last 6 months? 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  DIAGNOSTIC FINDINGS: none  COGNITION: Overall cognitive status: Within  functional limits for tasks assessed     SENSATION: Can feel the calves, but has minimal sensation in the feet  EDEMA:  Has some compression stocking  MUSCLE LENGTH: Tight mms in the HS and piriformis  POSTURE: rounded shoulders and forward head  PALPATION: Some tenderness in the calves  LOWER EXTREMITY ROM:  WFL's  LOWER EXTREMITY MMT:  MMT Right eval Left eval  Hip flexion 4 4  Hip extension    Hip abduction    Hip adduction    Hip internal rotation    Hip external rotation    Knee flexion 4 4  Knee extension 4- 4-  Ankle dorsiflexion 4 4-  Ankle plantarflexion    Ankle inversion 4- 4-  Ankle eversion 4- 4-   (Blank rows = not tested) FUNCTIONAL TESTS:  5 times sit to stand: 35 seconds had to use arm rests to push up with hands Timed up and go (TUG): 20 seconds, limp on the right, unsteady reached out and touched the walls and the door frames, 11/02/23 = 13 seconds Berg Balance Scale: 31/56  GAIT: Distance walked: 70 feet Assistive device utilized: None Level of assistance: CGA Comments: tends to use walls , unsteady,  limps on the right                                                                                                                                TREATMENT DATE:  11/23/23:  Instructed the pt in the following activities to address her LE strength, cardiovascular endurance: Knee flexion B, 20#, 3 x 12 Knee ext B, 10#, 3 x 12 Recumbent cycle level 2.2 5 min Nustep UE's and LE's level 5 x 6 min In ll bars for : heel/toe rocks on airex 20x Standing with 1 1/2# cuff wts on ankles, for alt hip abd, 10 x each Alt standing SLR, 10 x each Alt hip ext with knee extended, 10 x each  11/09/23: Recumbent bike 1.5, x 3 min 30 sec  Seated hamstring curls 20#, 3 x 10  Seated B knee ext 10# 3 x 10 Standing in ll bars with 2# cuff wts, alt hip abd, alt marching, alt hip and knee ext, 10 reps each Fast walking with HHA x 2 laps In ll bars on airex for heel  toe rocks 10 x  In ll bars for step backs , lead foot on airex, 10 x each Nustep level 4 x 6 min  11/02/23 Nustep level 5 x 7.5 minutes Bike level 2 x 6 minutes Leg curls 20# 3x10 Leg extension 10# 2x10 Standing on airex 5# straight arm pulls CGA due to poor balance Calf stretch On airex ball toss CGA for balance Walking ball toss with CGA TUG 13 seconds Direction changes Cone toe touches with two canes and then with one Fast walk 2 laps with [facility]    10/26/23 Nustep level 5 x 7 minutes Standing red tband row Standing red tband extension Leg curls 20# 2x10 Leg extension 10# 2x10 2.5# marches  2.5# hip abduction Standing on airex some ball toss needed CGA due to poor balance Calf stretches Side stepping in Pbars On airex reaching for numbers on wall On airex head turns Light HHA fast walk 2 laps, HR up to 130, recovery in less than 2 minutes, then did one more lap Cone toe touches with SPC and HHA  10/19/23:  Nustep L5 Ue's and LE's x 5 min 30 sec Patient demonstrated her home program: performed 20 reps each: Seated long arc quads,  Seated heel lifts,  Seated ankle dorsiflexion Seated march Instructed in seated ankle plantarflexor stretch and hamstring stretch with strap Inst in standing heel drops off black bar, alt, 10 sec holds Standing in ll bars for heel/toe rocks with B hand hold 15 x Standing for penguins alt 15 x , B hand hold support in ll bars   10/12/23 Nustep level 4 x 5 minutes      PATIENT EDUCATION:  Education details: POC/HEP Person educated: Patient Education method: Programmer, multimedia, Facilities manager, Verbal cues, and Handouts Education comprehension: verbalized understanding  HOME EXERCISE PROGRAM: Access Code: [email or web address] URL: [email or web address] Date: 10/12/2023 Prepared by: Casimiro Needle  Albright  Exercises - Seated March  - 1 x daily - 7 x weekly - 2 sets - 10 reps - 3 hold - Seated Long Arc Quad  - 1 x daily - 7 x weekly - 2 sets - 10  reps - 3 hold - Seated Toe Raise  - 1 x daily - 7 x weekly - 2 sets - 10 reps - 30 hold - Seated Heel Raise  - 1 x daily - 7 x weekly - 2 sets - 10 reps - 3 hold  ASSESSMENT:  CLINICAL IMPRESSION: Patient is a 78 y.o. female who participated with physical therapy treatment for weakness, leg pain and unsteady gait. She is improving overall, noted faster overall gait speed today and ability to change direction with less guarding and loss of balance.    Overall she reports improved function in her daily ADL's , tolerance to standing.  She is scheduled to continue skilled PT for one more month.    OBJECTIVE IMPAIRMENTS: Abnormal gait, cardiopulmonary status limiting activity, decreased activity tolerance, decreased balance, decreased coordination, decreased endurance, decreased mobility, difficulty walking, decreased ROM, decreased strength, decreased safety awareness, impaired flexibility, and pain.   REHAB POTENTIAL: Good  CLINICAL DECISION MAKING: Stable/uncomplicated  EVALUATION COMPLEXITY: Low   GOALS: Goals reviewed with patient? Yes  SHORT TERM GOALS: Target date: 11/01/23 Independent with initial HEP Baseline: not exercising Goal status:met 10/26/23   LONG TERM GOALS: Target date: 12/10/23  Independent with advanced HEP Goal status: INITIAL  2.  Decrease TUG to 13 seconds Baseline:  20 seconds Goal status: progressing 11/02/23  3.  Decrease 5XSTS to 21 seconds Baseline: 35 seconds with using hand to push up from armrest Goal status: progressing 11/02/23  4.  Increase Berg balance test score to 46/56 Baseline: 31/56 Goal status: INITIAL  5.  Walk safely >500 feet with SPC Goal status: INITIAL  PLAN:  PT FREQUENCY: 1-2x/week  PT DURATION: 12 weeks  PLANNED INTERVENTIONS: 97164- PT Re-evaluation, 97110-Therapeutic exercises, 97530- Therapeutic activity, 97112- Neuromuscular re-education, 97535- Self Care, 52841- Manual therapy, [phone]- Gait training, Patient/Family  education, Balance training, Stair training, and Vestibular training  PLAN FOR NEXT SESSION: continue with therex to improve righting reactions, balance, activity tolerance.  Arnez Stoneking L Hiawatha Dressel, PT, DPT, OCS 11/23/2023, 11:27 AM

## 2023-11-25 ENCOUNTER — Other Ambulatory Visit (HOSPITAL_COMMUNITY): Payer: Self-pay

## 2023-11-25 MED ORDER — METFORMIN HCL 500 MG PO TABS
500.0000 mg | ORAL_TABLET | Freq: Every evening | ORAL | 1 refills | Status: DC
  Filled 2023-11-25 – 2024-01-11 (×3): qty 90, 90d supply, fill #0
  Filled 2024-02-13 – 2024-03-30 (×2): qty 90, 90d supply, fill #1

## 2023-11-26 ENCOUNTER — Other Ambulatory Visit (HOSPITAL_COMMUNITY): Payer: Self-pay

## 2023-11-30 ENCOUNTER — Other Ambulatory Visit (HOSPITAL_COMMUNITY): Payer: Self-pay

## 2023-11-30 ENCOUNTER — Ambulatory Visit: Payer: PPO

## 2023-11-30 ENCOUNTER — Other Ambulatory Visit: Payer: Self-pay

## 2023-11-30 DIAGNOSIS — M6281 Muscle weakness (generalized): Secondary | ICD-10-CM

## 2023-11-30 DIAGNOSIS — R262 Difficulty in walking, not elsewhere classified: Secondary | ICD-10-CM

## 2023-11-30 DIAGNOSIS — R2681 Unsteadiness on feet: Secondary | ICD-10-CM

## 2023-11-30 MED ORDER — EVOLOCUMAB 140 MG/ML ~~LOC~~ SOAJ
140.0000 mg | SUBCUTANEOUS | 2 refills | Status: DC
  Filled 2023-11-30: qty 2, 28d supply, fill #0
  Filled 2023-12-13 – 2023-12-26 (×2): qty 2, 28d supply, fill #1
  Filled 2024-01-25: qty 2, 28d supply, fill #2

## 2023-11-30 NOTE — Therapy (Signed)
 OUTPATIENT PHYSICAL THERAPY LOWER EXTREMITY TREATMENT   Patient Name: Cathy Gonzalez MRN: [id number] DOB:05-26-1946, 78 y.o., female Today's Date: 11/30/2023  END OF SESSION:  PT End of Session - 11/30/23 0816     Visit Number 7    Date for PT Re-Evaluation 01/10/24    Authorization Type HTA    Progress Note Due on Visit 10    PT Start Time 0800    PT Stop Time 0845    PT Time Calculation (min) 45 min    Activity Tolerance Patient tolerated treatment well    Behavior During Therapy [facility] for tasks assessed/performed                 Past Medical History:  Diagnosis Date   Arthritis    Cataract    left cataract removed in 2017   Diabetes mellitus    Diabetic neuropathy (HCC)    Diabetic neuropathy, type II diabetes mellitus (HCC)    Hepatitis C    took tx for    Hypertension    Hypokalemia    Knee pain    right    Morbidly obese (HCC)    Nocturnal seizures (HCC) 05/14/2015   no bad seizure since 1980's   OSA on CPAP    uses cpap 2-3 week   Plantar fasciitis    both feet   Past Surgical History:  Procedure Laterality Date   ABDOMINAL HYSTERECTOMY     partial   ANTERIOR CERVICAL DECOMP/DISCECTOMY FUSION N/A 02/24/2022   Procedure: CERVICAL FOUR- CERVICAL FIVE ANTERIOR CERVICAL DECOMPRESSION/DISCECTOMY FUSION ONE LEVEL WITH ALLOGRAFT AND PLATE;  Surgeon: Eldred Manges, MD;  Location: [facility] OR;  Service: Orthopedics;  Laterality: N/A;   CATARACT EXTRACTION Left    COLONOSCOPY WITH PROPOFOL N/A 08/21/2015   Procedure: COLONOSCOPY WITH PROPOFOL;  Surgeon: [name], MD;  Location: [facility];  Service: Endoscopy;  Laterality: N/A;   COLONOSCOPY WITH PROPOFOL N/A 08/22/2022   Procedure: COLONOSCOPY WITH PROPOFOL;  Surgeon: [name], MD;  Location: [facility];  Service: Gastroenterology;  Laterality: N/A;   ESOPHAGOGASTRODUODENOSCOPY (EGD) WITH PROPOFOL N/A 08/21/2015   Procedure: ESOPHAGOGASTRODUODENOSCOPY (EGD) WITH PROPOFOL;  Surgeon: [name], MD;   Location: [facility];  Service: Endoscopy;  Laterality: N/A;   ESOPHAGOGASTRODUODENOSCOPY (EGD) WITH PROPOFOL N/A 10/08/2018   Procedure: ESOPHAGOGASTRODUODENOSCOPY (EGD) WITH PROPOFOL;  Surgeon: [name], MD;  Location: [facility];  Service: Endoscopy;  Laterality: N/A;   ESOPHAGOGASTRODUODENOSCOPY (EGD) WITH PROPOFOL N/A 08/22/2022   Procedure: ESOPHAGOGASTRODUODENOSCOPY (EGD) WITH PROPOFOL;  Surgeon: [name], MD;  Location: [facility];  Service: Gastroenterology;  Laterality: N/A;   EYE SURGERY Bilateral    cataracts   POLYPECTOMY  08/22/2022   Procedure: POLYPECTOMY;  Surgeon: [name], MD;  Location: [facility];  Service: Gastroenterology;;   TUBAL LIGATION     Patient Active Problem List   Diagnosis Date Noted   Gouty neuritis (HCC) 03/16/2023   Paroxysmal A-fib (HCC) 03/16/2023   Pain in right hand 10/28/2022   Impingement syndrome of right shoulder 08/12/2022   S/P cervical spinal fusion 06/25/2022   Pain due to onychomycosis of toenails of both feet 05/06/2022   Cervical stenosis of spine 02/24/2022   Neck pain 12/03/2021   Abnormal liver function tests 02/12/2021   Diarrhea 02/12/2021   Gastro-esophageal reflux disease without esophagitis 02/12/2021   Periumbilical pain 02/12/2021   History of colonic polyps 02/12/2021   Portal hypertension (HCC) 02/12/2021   Screening for malignant neoplasm of colon 02/12/2021   Type 2 diabetes  mellitus without complications (HCC) 02/12/2021   Primary osteoarthritis, left hand 11/07/2020   Arthritis of carpometacarpal (CMC) joint of thumb 07/18/2020   Bilateral primary osteoarthritis of knee 06/20/2020   Bilateral primary osteoarthritis of hip 06/07/2020   Right thigh pain 05/09/2020   Low back pain 07/14/2019   OSA on CPAP 05/16/2019   Diabetic neuropathy associated with type 2 diabetes mellitus (HCC) 12/13/2018   Chronic pain of right knee 12/09/2018   Thumb pain, left 12/09/2018   Dizziness and giddiness  01/14/2016   Hepatic cirrhosis (HCC) 07/26/2015   Nocturnal seizures (HCC) 05/14/2015   Chronic hepatitis C without hepatic coma (HCC) 03/14/2015   Vertigo, central 02/11/2014   Difficulty walking 02/11/2014   Diabetes mellitus (HCC) 02/11/2014   Dysphagia 02/11/2014   Class 3 severe obesity with serious comorbidity and body mass index (BMI) of 40.0 to 44.9 in adult, unspecified obesity type (HCC) 07/26/2010   Essential hypertension, benign 07/26/2010   DEGENERATIVE JOINT DISEASE, GENERALIZED 07/26/2010    PCP: Parke Simmers, MD  REFERRING PROVIDER: Parke Simmers, MD  REFERRING DIAG: unsteadiness with walking  THERAPY DIAG:  Difficulty in walking, not elsewhere classified  Muscle weakness (generalized)  Unsteady gait  Rationale for Evaluation and Treatment: Rehabilitation  ONSET DATE: 09/23/23  SUBJECTIVE:   SUBJECTIVE STATEMENT: Patient reports that she continues to improve with her walking and balance especially.  She was sore in her calves after last session. PERTINENT HISTORY: See above PAIN:  Are you having pain? Yes: NPRS scale: 4/10 Pain location: feet and right knee Pain description: feet burn from neuropathy, right knee aches Aggravating factors: walking Relieving factors: rest  PRECAUTIONS: Fall  RED FLAGS: None   WEIGHT BEARING RESTRICTIONS: No  FALLS:  Has patient fallen in last 6 months? 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  DIAGNOSTIC FINDINGS: none  COGNITION: Overall cognitive status: Within functional  limits for tasks assessed     SENSATION: Can feel the calves, but has minimal sensation in the feet  EDEMA:  Has some compression stocking  MUSCLE LENGTH: Tight mms in the HS and piriformis  POSTURE: rounded shoulders and forward head  PALPATION: Some tenderness in the calves  LOWER EXTREMITY ROM:  WFL's  LOWER EXTREMITY MMT:  MMT Right eval Left eval  Hip flexion 4 4  Hip extension    Hip abduction    Hip adduction    Hip internal rotation    Hip external rotation    Knee flexion 4 4  Knee extension 4- 4-  Ankle dorsiflexion 4 4-  Ankle plantarflexion    Ankle inversion 4- 4-  Ankle eversion 4- 4-   (Blank rows = not tested) FUNCTIONAL TESTS:  5 times sit to stand: 35 seconds had to use arm rests to push up with hands Timed up and go (TUG): 20 seconds, limp on the right, unsteady reached out and touched the walls and the door frames, 11/02/23 = 13 seconds Berg Balance Scale: 31/56  GAIT: Distance walked: 70 feet Assistive device utilized: None Level of assistance: CGA Comments: tends to use walls ,  unsteady, limps on the right                                                                                                                                TREATMENT DATE:  11/30/23: Instructed/progressed the pt in the following activities to address her LE strength, cardiovascular endurance, and stability/balance: Recumbent cycle 5 min Nustep 6 min B Knee flexion 35# 2 x 10 B knee ext 10# 3 x 10 Standing in ll bars with 2 # cuff wts ankles, alt SLR, cues to maintain full knee extension throughout 10x Hip ext, alt with cues again for full ext knees 10x Side stepping in ll bars with 2# wts 4 lengths Standing for alt hip abd 2 # wts 10x Standing on airex heel/toe rocks 20x   11/23/23:  Instructed the pt in the following activities to address her LE strength, cardiovascular endurance: Knee flexion B, 20#, 3 x 12 Knee ext B, 10#, 3 x 12 Recumbent cycle level 2.2  5 min Nustep UE's and LE's level 5 x 6 min In ll bars for : heel/toe rocks on airex 20x Standing with 1 1/2# cuff wts on ankles, for alt hip abd, 10 x each Alt standing SLR, 10 x each Alt hip ext with knee extended, 10 x each  11/09/23: Recumbent bike 1.5, x 3 min 30 sec  Seated hamstring curls 20#, 3 x 10  Seated B knee ext 10# 3 x 10 Standing in ll bars with 2# cuff wts, alt hip abd, alt marching, alt hip and knee ext, 10 reps each Fast walking with HHA x 2 laps In ll bars on airex for heel toe rocks 10 x  In ll bars for step backs , lead foot on airex, 10 x each Nustep level 4 x 6 min  11/02/23 Nustep level 5 x 7.5 minutes Bike level 2 x 6 minutes Leg curls 20# 3x10 Leg extension 10# 2x10 Standing on airex 5# straight arm pulls CGA due to poor balance Calf stretch On airex ball toss CGA for balance Walking ball toss with CGA TUG 13 seconds Direction changes Cone toe touches with two canes and then with one Fast walk 2 laps with [facility]    10/26/23 Nustep level 5 x 7 minutes Standing red tband row Standing red tband extension Leg curls 20# 2x10 Leg extension 10# 2x10 2.5# marches  2.5# hip abduction Standing on airex some ball toss needed CGA due to poor balance Calf stretches Side stepping in Pbars On airex reaching for numbers on wall On airex head turns Light HHA fast walk 2 laps, HR up to 130, recovery in less than 2 minutes, then did one more lap Cone toe touches with SPC and HHA  10/19/23:  Nustep L5 Ue's and LE's x 5 min 30 sec Patient demonstrated her home program: performed 20 reps each: Seated long arc quads,  Seated heel lifts,  Seated ankle dorsiflexion Seated march Instructed in  seated ankle plantarflexor stretch and hamstring stretch with strap Inst in standing heel drops off black bar, alt, 10 sec holds Standing in ll bars for heel/toe rocks with B hand hold 15 x Standing for penguins alt 15 x , B hand hold support in ll bars   10/12/23 Nustep level  4 x 5 minutes      PATIENT EDUCATION:  Education details: POC/HEP Person educated: Patient Education method: Programmer, multimedia, Facilities manager, Verbal cues, and Handouts Education comprehension: verbalized understanding  HOME EXERCISE PROGRAM: Access Code: [email or web address] URL: [email or web address] Date: 10/12/2023 Prepared by: Stacie Glaze  Exercises - Seated March  - 1 x daily - 7 x weekly - 2 sets - 10 reps - 3 hold - Seated Long Arc Quad  - 1 x daily - 7 x weekly - 2 sets - 10 reps - 3 hold - Seated Toe Raise  - 1 x daily - 7 x weekly - 2 sets - 10 reps - 30 hold - Seated Heel Raise  - 1 x daily - 7 x weekly - 2 sets - 10 reps - 3 hold  ASSESSMENT:  CLINICAL IMPRESSION: Patient is a 78 y.o. female who participated with physical therapy treatment for weakness, leg pain and unsteady gait. She is continuing to improve overall.  Increased resistance on the hamstring curls today. She is scheduled to continue skilled PT for one more month.    OBJECTIVE IMPAIRMENTS: Abnormal gait, cardiopulmonary status limiting activity, decreased activity tolerance, decreased balance, decreased coordination, decreased endurance, decreased mobility, difficulty walking, decreased ROM, decreased strength, decreased safety awareness, impaired flexibility, and pain.   REHAB POTENTIAL: Good  CLINICAL DECISION MAKING: Stable/uncomplicated  EVALUATION COMPLEXITY: Low   GOALS: Goals reviewed with patient? Yes  SHORT TERM GOALS: Target date: 11/01/23 Independent with initial HEP Baseline: not exercising Goal status:met 10/26/23   LONG TERM GOALS: Target date: 12/10/23  Independent with advanced HEP Goal status: INITIAL  2.  Decrease TUG to 13 seconds Baseline:  20 seconds Goal status: progressing 11/02/23  3.  Decrease 5XSTS to 21 seconds Baseline: 35 seconds with using hand to push up from armrest Goal status: progressing 11/02/23  4.  Increase Berg balance test score to  46/56 Baseline: 31/56 Goal status: INITIAL  5.  Walk safely >500 feet with SPC Goal status: INITIAL  PLAN:  PT FREQUENCY: 1-2x/week  PT DURATION: 12 weeks  PLANNED INTERVENTIONS: 97164- PT Re-evaluation, 97110-Therapeutic exercises, 97530- Therapeutic activity, 97112- Neuromuscular re-education, 97535- Self Care, 16109- Manual therapy, [phone]- Gait training, Patient/Family education, Balance training, Stair training, and Vestibular training  PLAN FOR NEXT SESSION: continue with therex to improve righting reactions, balance, activity tolerance.  Beverely Suen L Cruise Baumgardner, PT, DPT, OCS 11/30/2023, 8:20 AM

## 2023-12-07 ENCOUNTER — Ambulatory Visit: Payer: PPO | Attending: Family Medicine | Admitting: Physical Therapy

## 2023-12-07 ENCOUNTER — Encounter: Payer: Self-pay | Admitting: Physical Therapy

## 2023-12-07 DIAGNOSIS — R262 Difficulty in walking, not elsewhere classified: Secondary | ICD-10-CM | POA: Insufficient documentation

## 2023-12-07 DIAGNOSIS — R2681 Unsteadiness on feet: Secondary | ICD-10-CM | POA: Diagnosis not present

## 2023-12-07 DIAGNOSIS — M6281 Muscle weakness (generalized): Secondary | ICD-10-CM | POA: Diagnosis not present

## 2023-12-07 NOTE — Therapy (Signed)
 OUTPATIENT PHYSICAL THERAPY LOWER EXTREMITY TREATMENT   Patient Name: Cathy Gonzalez MRN: [id number] DOB:1946-05-22, 78 y.o., female Today's Date: 12/07/2023  END OF SESSION:  PT End of Session - 12/07/23 0748     Visit Number 8    Date for PT Re-Evaluation 01/10/24    Authorization Type HTA    PT Start Time 0748    PT Stop Time 0835    PT Time Calculation (min) 47 min    Activity Tolerance Patient tolerated treatment well    Behavior During Therapy [facility] for tasks assessed/performed                 Past Medical History:  Diagnosis Date   Arthritis    Cataract    left cataract removed in 2017   Diabetes mellitus    Diabetic neuropathy (HCC)    Diabetic neuropathy, type II diabetes mellitus (HCC)    Hepatitis C    took tx for    Hypertension    Hypokalemia    Knee pain    right    Morbidly obese (HCC)    Nocturnal seizures (HCC) 05/14/2015   no bad seizure since 1980's   OSA on CPAP    uses cpap 2-3 week   Plantar fasciitis    both feet   Past Surgical History:  Procedure Laterality Date   ABDOMINAL HYSTERECTOMY     partial   ANTERIOR CERVICAL DECOMP/DISCECTOMY FUSION N/A 02/24/2022   Procedure: CERVICAL FOUR- CERVICAL FIVE ANTERIOR CERVICAL DECOMPRESSION/DISCECTOMY FUSION ONE LEVEL WITH ALLOGRAFT AND PLATE;  Surgeon: Eldred Manges, MD;  Location: [facility] OR;  Service: Orthopedics;  Laterality: N/A;   CATARACT EXTRACTION Left    COLONOSCOPY WITH PROPOFOL N/A 08/21/2015   Procedure: COLONOSCOPY WITH PROPOFOL;  Surgeon: [name], MD;  Location: [facility] END[facility];  Service: Endoscopy;  Laterality: N/A;   COLONOSCOPY WITH PROPOFOL N/A 08/22/2022   Procedure: COLONOSCOPY WITH PROPOFOL;  Surgeon: [name], MD;  Location: [facility];  Service: Gastroenterology;  Laterality: N/A;   ESOPHAGOGASTRODUODENOSCOPY (EGD) WITH PROPOFOL N/A 08/21/2015   Procedure: ESOPHAGOGASTRODUODENOSCOPY (EGD) WITH PROPOFOL;  Surgeon: [name], MD;  Location: [facility];  Service:  Endoscopy;  Laterality: N/A;   ESOPHAGOGASTRODUODENOSCOPY (EGD) WITH PROPOFOL N/A 10/08/2018   Procedure: ESOPHAGOGASTRODUODENOSCOPY (EGD) WITH PROPOFOL;  Surgeon: [name], MD;  Location: [facility];  Service: Endoscopy;  Laterality: N/A;   ESOPHAGOGASTRODUODENOSCOPY (EGD) WITH PROPOFOL N/A 08/22/2022   Procedure: ESOPHAGOGASTRODUODENOSCOPY (EGD) WITH PROPOFOL;  Surgeon: [name], MD;  Location: [facility];  Service: Gastroenterology;  Laterality: N/A;   EYE SURGERY Bilateral    cataracts   POLYPECTOMY  08/22/2022   Procedure: POLYPECTOMY;  Surgeon: [name], MD;  Location: [facility];  Service: Gastroenterology;;   TUBAL LIGATION     Patient Active Problem List   Diagnosis Date Noted   Gouty neuritis (HCC) 03/16/2023   Paroxysmal A-fib (HCC) 03/16/2023   Pain in right hand 10/28/2022   Impingement syndrome of right shoulder 08/12/2022   S/P cervical spinal fusion 06/25/2022   Pain due to onychomycosis of toenails of both feet 05/06/2022   Cervical stenosis of spine 02/24/2022   Neck pain 12/03/2021   Abnormal liver function tests 02/12/2021   Diarrhea 02/12/2021   Gastro-esophageal reflux disease without esophagitis 02/12/2021   Periumbilical pain 02/12/2021   History of colonic polyps 02/12/2021   Portal hypertension (HCC) 02/12/2021   Screening for malignant neoplasm of colon 02/12/2021   Type 2 diabetes mellitus without complications (HCC) 02/12/2021   Primary osteoarthritis,  left hand 11/07/2020   Arthritis of carpometacarpal [facility]) joint of thumb 07/18/2020   Bilateral primary osteoarthritis of knee 06/20/2020   Bilateral primary osteoarthritis of hip 06/07/2020   Right thigh pain 05/09/2020   Low back pain 07/14/2019   OSA on CPAP 05/16/2019   Diabetic neuropathy associated with type 2 diabetes mellitus (HCC) 12/13/2018   Chronic pain of right knee 12/09/2018   Thumb pain, left 12/09/2018   Dizziness and giddiness 01/14/2016   Hepatic cirrhosis  (HCC) 07/26/2015   Nocturnal seizures (HCC) 05/14/2015   Chronic hepatitis C without hepatic coma (HCC) 03/14/2015   Vertigo, central 02/11/2014   Difficulty walking 02/11/2014   Diabetes mellitus (HCC) 02/11/2014   Dysphagia 02/11/2014   Class 3 severe obesity with serious comorbidity and body mass index (BMI) of 40.0 to 44.9 in adult, unspecified obesity type (HCC) 07/26/2010   Essential hypertension, benign 07/26/2010   DEGENERATIVE JOINT DISEASE, GENERALIZED 07/26/2010    PCP: Parke Simmers, MD  REFERRING PROVIDER: Parke Simmers, MD  REFERRING DIAG: unsteadiness with walking  THERAPY DIAG:  Difficulty in walking, not elsewhere classified  Muscle weakness (generalized)  Unsteady gait  Rationale for Evaluation and Treatment: Rehabilitation  ONSET DATE: 09/23/23  SUBJECTIVE:   SUBJECTIVE STATEMENT: Patient reports that she feels like she is walking better, not using a cane today, does c/o calf cramping bilaterally PERTINENT HISTORY: See above PAIN:  Are you having pain? Yes: NPRS scale: 3/10 Pain location: feet and right knee Pain description: feet burn from neuropathy, right knee aches Aggravating factors: walking Relieving factors: rest  PRECAUTIONS: Fall  RED FLAGS: None   WEIGHT BEARING RESTRICTIONS: No  FALLS:  Has patient fallen in last 6 months? 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  DIAGNOSTIC FINDINGS: none  COGNITION: Overall cognitive status: Within functional limits for tasks assessed     SENSATION: Can  feel the calves, but has minimal sensation in the feet  EDEMA:  Has some compression stocking  MUSCLE LENGTH: Tight mms in the HS and piriformis  POSTURE: rounded shoulders and forward head  PALPATION: Some tenderness in the calves  LOWER EXTREMITY ROM:  WFL's  LOWER EXTREMITY MMT:  MMT Right eval Left eval  Hip flexion 4 4  Hip extension    Hip abduction    Hip adduction    Hip internal rotation    Hip external rotation    Knee flexion 4 4  Knee extension 4- 4-  Ankle dorsiflexion 4 4-  Ankle plantarflexion    Ankle inversion 4- 4-  Ankle eversion 4- 4-   (Blank rows = not tested) FUNCTIONAL TESTS:  5 times sit to stand: 35 seconds had to use arm rests to push up with hands Timed up and go (TUG): 20 seconds, limp on the right, unsteady reached out and touched the walls and the door frames, 11/02/23 = 13 seconds Berg Balance Scale: 31/56  GAIT: Distance walked: 70 feet Assistive device utilized: None Level of assistance: CGA Comments: tends to use walls , unsteady, limps on the right  TREATMENT DATE:  12/07/23 Nustep level 5 x 7 minutes Slant board calf stretches On airex 5# straight arm pulls needed CGA due to poor balance Knee flexion 25# 3x10 Leg extension 5# 3x10 On and off airex ball toss, very scary for her Side stepping on airex balance beam On airex reaching for numbers on wall Walking ball toss 1 lap and needed rest Standing 2.5# marching, hip abduction and extension Walking direction changes with HHA Holding onto [facility] 6" toe touches  11/30/23: Instructed/progressed the pt in the following activities to address her LE strength, cardiovascular endurance, and stability/balance: Recumbent cycle 5 min Nustep 6 min B Knee flexion 35# 2 x 10 B knee ext 10# 3 x 10 Standing in ll bars with 2 # cuff wts ankles, alt SLR, cues to  maintain full knee extension throughout 10x Hip ext, alt with cues again for full ext knees 10x Side stepping in ll bars with 2# wts 4 lengths Standing for alt hip abd 2 # wts 10x Standing on airex heel/toe rocks 20x   11/23/23:  Instructed the pt in the following activities to address her LE strength, cardiovascular endurance: Knee flexion B, 20#, 3 x 12 Knee ext B, 10#, 3 x 12 Recumbent cycle level 2.2 5 min Nustep UE's and LE's level 5 x 6 min In ll bars for : heel/toe rocks on airex 20x Standing with 1 1/2# cuff wts on ankles, for alt hip abd, 10 x each Alt standing SLR, 10 x each Alt hip ext with knee extended, 10 x each  11/09/23: Recumbent bike 1.5, x 3 min 30 sec  Seated hamstring curls 20#, 3 x 10  Seated B knee ext 10# 3 x 10 Standing in ll bars with 2# cuff wts, alt hip abd, alt marching, alt hip and knee ext, 10 reps each Fast walking with HHA x 2 laps In ll bars on airex for heel toe rocks 10 x  In ll bars for step backs , lead foot on airex, 10 x each Nustep level 4 x 6 min  11/02/23 Nustep level 5 x 7.5 minutes Bike level 2 x 6 minutes Leg curls 20# 3x10 Leg extension 10# 2x10 Standing on airex 5# straight arm pulls CGA due to poor balance Calf stretch On airex ball toss CGA for balance Walking ball toss with CGA TUG 13 seconds Direction changes Cone toe touches with two canes and then with one Fast walk 2 laps with [facility]    10/26/23 Nustep level 5 x 7 minutes Standing red tband row Standing red tband extension Leg curls 20# 2x10 Leg extension 10# 2x10 2.5# marches  2.5# hip abduction Standing on airex some ball toss needed CGA due to poor balance Calf stretches Side stepping in Pbars On airex reaching for numbers on wall On airex head turns Light HHA fast walk 2 laps, HR up to 130, recovery in less than 2 minutes, then did one more lap Cone toe touches with SPC and HHA  10/19/23:  Nustep L5 Ue's and LE's x 5 min 30 sec Patient demonstrated her home  program: performed 20 reps each: Seated long arc quads,  Seated heel lifts,  Seated ankle dorsiflexion Seated march Instructed in seated ankle plantarflexor stretch and hamstring stretch with strap Inst in standing heel drops off black bar, alt, 10 sec holds Standing in ll bars for heel/toe rocks with B hand hold 15 x Standing for penguins alt 15 x , B hand hold support in ll bars  10/12/23 Nustep level 4 x 5 minutes      PATIENT EDUCATION:  Education details: POC/HEP Person educated: Patient Education method: Programmer, multimedia, Facilities manager, Verbal cues, and Handouts Education comprehension: verbalized understanding  HOME EXERCISE PROGRAM: Access Code: [email or web address] URL: [email or web address] Date: 10/12/2023 Prepared by: Stacie Glaze  Exercises - Seated March  - 1 x daily - 7 x weekly - 2 sets - 10 reps - 3 hold - Seated Long Arc Quad  - 1 x daily - 7 x weekly - 2 sets - 10 reps - 3 hold - Seated Toe Raise  - 1 x daily - 7 x weekly - 2 sets - 10 reps - 30 hold - Seated Heel Raise  - 1 x daily - 7 x weekly - 2 sets - 10 reps - 3 hold  ASSESSMENT:  CLINICAL IMPRESSION: Patient is a 78 y.o. female who participated with physical therapy treatment for weakness, leg pain and unsteady gait. She reports that she was able to clean the house yesterday, she really struggles with the dynamic surfaces and was much more short of breath today.  OBJECTIVE IMPAIRMENTS: Abnormal gait, cardiopulmonary status limiting activity, decreased activity tolerance, decreased balance, decreased coordination, decreased endurance, decreased mobility, difficulty walking, decreased ROM, decreased strength, decreased safety awareness, impaired flexibility, and pain.   REHAB POTENTIAL: Good  CLINICAL DECISION MAKING: Stable/uncomplicated  EVALUATION COMPLEXITY: Low   GOALS: Goals reviewed with patient? Yes  SHORT TERM GOALS: Target date: 11/01/23 Independent with initial HEP Baseline: not  exercising Goal status:met 10/26/23   LONG TERM GOALS: Target date: 12/10/23  Independent with advanced HEP Goal status: INITIAL  2.  Decrease TUG to 13 seconds Baseline:  20 seconds Goal status: progressing 12/07/23  3.  Decrease 5XSTS to 21 seconds Baseline: 35 seconds with using hand to push up from armrest Goal status: progressing 12/07/23  4.  Increase Berg balance test score to 46/56 Baseline: 31/56 Goal status: INITIAL  5.  Walk safely >500 feet with SPC Goal status: progressing 12/07/23  PLAN:  PT FREQUENCY: 1-2x/week  PT DURATION: 12 weeks  PLANNED INTERVENTIONS: 97164- PT Re-evaluation, 97110-Therapeutic exercises, 97530- Therapeutic activity, 97112- Neuromuscular re-education, 97535- Self Care, 16109- Manual therapy, [phone]- Gait training, Patient/Family education, Balance training, Stair training, and Vestibular training  PLAN FOR NEXT SESSION: continue with therex to improve righting reactions, balance, activity tolerance.  Jearld Lesch, PT

## 2023-12-11 DIAGNOSIS — I10 Essential (primary) hypertension: Secondary | ICD-10-CM | POA: Diagnosis not present

## 2023-12-11 DIAGNOSIS — E6609 Other obesity due to excess calories: Secondary | ICD-10-CM | POA: Diagnosis not present

## 2023-12-11 DIAGNOSIS — E785 Hyperlipidemia, unspecified: Secondary | ICD-10-CM | POA: Diagnosis not present

## 2023-12-11 DIAGNOSIS — E1169 Type 2 diabetes mellitus with other specified complication: Secondary | ICD-10-CM | POA: Diagnosis not present

## 2023-12-14 ENCOUNTER — Encounter: Payer: Self-pay | Admitting: Physical Therapy

## 2023-12-14 ENCOUNTER — Other Ambulatory Visit (HOSPITAL_COMMUNITY): Payer: Self-pay

## 2023-12-14 ENCOUNTER — Ambulatory Visit: Payer: PPO | Admitting: Physical Therapy

## 2023-12-14 DIAGNOSIS — R2681 Unsteadiness on feet: Secondary | ICD-10-CM

## 2023-12-14 DIAGNOSIS — R262 Difficulty in walking, not elsewhere classified: Secondary | ICD-10-CM | POA: Diagnosis not present

## 2023-12-14 DIAGNOSIS — M6281 Muscle weakness (generalized): Secondary | ICD-10-CM

## 2023-12-14 NOTE — Therapy (Signed)
 OUTPATIENT PHYSICAL THERAPY LOWER EXTREMITY TREATMENT   Patient Name: Cathy Gonzalez MRN: [id number] DOB:21-Jun-1946, 78 y.o., female Today's Date: 12/14/2023  END OF SESSION:  PT End of Session - 12/14/23 0803     Visit Number 9    Date for PT Re-Evaluation 01/10/24    Authorization Type HTA    PT Start Time 0758    PT Stop Time 0843    PT Time Calculation (min) 45 min    Activity Tolerance Patient tolerated treatment well    Behavior During Therapy [facility] for tasks assessed/performed                 Past Medical History:  Diagnosis Date   Arthritis    Cataract    left cataract removed in 2017   Diabetes mellitus    Diabetic neuropathy (HCC)    Diabetic neuropathy, type II diabetes mellitus (HCC)    Hepatitis C    took tx for    Hypertension    Hypokalemia    Knee pain    right    Morbidly obese (HCC)    Nocturnal seizures (HCC) 05/14/2015   no bad seizure since 1980's   OSA on CPAP    uses cpap 2-3 week   Plantar fasciitis    both feet   Past Surgical History:  Procedure Laterality Date   ABDOMINAL HYSTERECTOMY     partial   ANTERIOR CERVICAL DECOMP/DISCECTOMY FUSION N/A 02/24/2022   Procedure: CERVICAL FOUR- CERVICAL FIVE ANTERIOR CERVICAL DECOMPRESSION/DISCECTOMY FUSION ONE LEVEL WITH ALLOGRAFT AND PLATE;  Surgeon: Eldred Manges, MD;  Location: [facility] OR;  Service: Orthopedics;  Laterality: N/A;   CATARACT EXTRACTION Left    COLONOSCOPY WITH PROPOFOL N/A 08/21/2015   Procedure: COLONOSCOPY WITH PROPOFOL;  Surgeon: [name], MD;  Location: [facility];  Service: Endoscopy;  Laterality: N/A;   COLONOSCOPY WITH PROPOFOL N/A 08/22/2022   Procedure: COLONOSCOPY WITH PROPOFOL;  Surgeon: [name], MD;  Location: [facility];  Service: Gastroenterology;  Laterality: N/A;   ESOPHAGOGASTRODUODENOSCOPY (EGD) WITH PROPOFOL N/A 08/21/2015   Procedure: ESOPHAGOGASTRODUODENOSCOPY (EGD) WITH PROPOFOL;  Surgeon: [name], MD;  Location: [facility];  Service:  Endoscopy;  Laterality: N/A;   ESOPHAGOGASTRODUODENOSCOPY (EGD) WITH PROPOFOL N/A 10/08/2018   Procedure: ESOPHAGOGASTRODUODENOSCOPY (EGD) WITH PROPOFOL;  Surgeon: [name], MD;  Location: [facility];  Service: Endoscopy;  Laterality: N/A;   ESOPHAGOGASTRODUODENOSCOPY (EGD) WITH PROPOFOL N/A 08/22/2022   Procedure: ESOPHAGOGASTRODUODENOSCOPY (EGD) WITH PROPOFOL;  Surgeon: [name], MD;  Location: [facility];  Service: Gastroenterology;  Laterality: N/A;   EYE SURGERY Bilateral    cataracts   POLYPECTOMY  08/22/2022   Procedure: POLYPECTOMY;  Surgeon: [name], MD;  Location: [facility];  Service: Gastroenterology;;   TUBAL LIGATION     Patient Active Problem List   Diagnosis Date Noted   Gouty neuritis (HCC) 03/16/2023   Paroxysmal A-fib (HCC) 03/16/2023   Pain in right hand 10/28/2022   Impingement syndrome of right shoulder 08/12/2022   S/P cervical spinal fusion 06/25/2022   Pain due to onychomycosis of toenails of both feet 05/06/2022   Cervical stenosis of spine 02/24/2022   Neck pain 12/03/2021   Abnormal liver function tests 02/12/2021   Diarrhea 02/12/2021   Gastro-esophageal reflux disease without esophagitis 02/12/2021   Periumbilical pain 02/12/2021   History of colonic polyps 02/12/2021   Portal hypertension (HCC) 02/12/2021   Screening for malignant neoplasm of colon 02/12/2021   Type 2 diabetes mellitus without complications (HCC) 02/12/2021   Primary osteoarthritis,  left hand 11/07/2020   Arthritis of carpometacarpal [facility]) joint of thumb 07/18/2020   Bilateral primary osteoarthritis of knee 06/20/2020   Bilateral primary osteoarthritis of hip 06/07/2020   Right thigh pain 05/09/2020   Low back pain 07/14/2019   OSA on CPAP 05/16/2019   Diabetic neuropathy associated with type 2 diabetes mellitus (HCC) 12/13/2018   Chronic pain of right knee 12/09/2018   Thumb pain, left 12/09/2018   Dizziness and giddiness 01/14/2016   Hepatic cirrhosis  (HCC) 07/26/2015   Nocturnal seizures (HCC) 05/14/2015   Chronic hepatitis C without hepatic coma (HCC) 03/14/2015   Vertigo, central 02/11/2014   Difficulty walking 02/11/2014   Diabetes mellitus (HCC) 02/11/2014   Dysphagia 02/11/2014   Class 3 severe obesity with serious comorbidity and body mass index (BMI) of 40.0 to 44.9 in adult, unspecified obesity type (HCC) 07/26/2010   Essential hypertension, benign 07/26/2010   DEGENERATIVE JOINT DISEASE, GENERALIZED 07/26/2010    PCP: Parke Simmers, MD  REFERRING PROVIDER: Parke Simmers, MD  REFERRING DIAG: unsteadiness with walking  THERAPY DIAG:  Difficulty in walking, not elsewhere classified  Muscle weakness (generalized)  Unsteady gait  Rationale for Evaluation and Treatment: Rehabilitation  ONSET DATE: 09/23/23  SUBJECTIVE:   SUBJECTIVE STATEMENT: Patient reports calf tightness and pain all weekend PERTINENT HISTORY: See above PAIN:  Are you having pain? Yes: NPRS scale: 3/10 Pain location: feet and right knee Pain description: feet burn from neuropathy, right knee aches Aggravating factors: walking Relieving factors: rest  PRECAUTIONS: Fall  RED FLAGS: None   WEIGHT BEARING RESTRICTIONS: No  FALLS:  Has patient fallen in last 6 months? 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  DIAGNOSTIC FINDINGS: none  COGNITION: Overall cognitive status: Within functional limits for tasks assessed     SENSATION: Can feel the calves, but has minimal sensation in the feet  EDEMA:   Has some compression stocking  MUSCLE LENGTH: Tight mms in the HS and piriformis  POSTURE: rounded shoulders and forward head  PALPATION: Some tenderness in the calves  LOWER EXTREMITY ROM:  WFL's  LOWER EXTREMITY MMT:  MMT Right eval Left eval  Hip flexion 4 4  Hip extension    Hip abduction    Hip adduction    Hip internal rotation    Hip external rotation    Knee flexion 4 4  Knee extension 4- 4-  Ankle dorsiflexion 4 4-  Ankle plantarflexion    Ankle inversion 4- 4-  Ankle eversion 4- 4-   (Blank rows = not tested) FUNCTIONAL TESTS:  5 times sit to stand: 35 seconds had to use arm rests to push up with hands Timed up and go (TUG): 20 seconds, limp on the right, unsteady reached out and touched the walls and the door frames, 11/02/23 = 13 seconds Berg Balance Scale: 31/56  GAIT: Distance walked: 70 feet Assistive device utilized: None Level of assistance: CGA Comments: tends to use walls , unsteady, limps on the right  TREATMENT DATE:  12/14/23 Nustep level 5 x 7 minutes Slant board calf stretches Passive Calf stretches HS curls 25# 3x10 Leg extension 5# 3x10 Seated pball isometric abs On airex 5# straight arm pulls Ball toss and catch HHA side step, fwd and backward walking Cone toe touches with SPC Education on HEP and calf stretches Walking 2 laps 200 feet with SPC and SBA On airex balance beam side stepping  12/07/23 Nustep level 5 x 7 minutes Slant board calf stretches On airex 5# straight arm pulls needed CGA due to poor balance Knee flexion 25# 3x10 Leg extension 5# 3x10 On and off airex ball toss, very scary for her Side stepping on airex balance beam On airex reaching for numbers on wall Walking ball toss 1 lap and needed rest Standing 2.5# marching, hip abduction and extension Walking direction changes with  HHA Holding onto [facility] 6" toe touches  11/30/23: Instructed/progressed the pt in the following activities to address her LE strength, cardiovascular endurance, and stability/balance: Recumbent cycle 5 min Nustep 6 min B Knee flexion 35# 2 x 10 B knee ext 10# 3 x 10 Standing in ll bars with 2 # cuff wts ankles, alt SLR, cues to maintain full knee extension throughout 10x Hip ext, alt with cues again for full ext knees 10x Side stepping in ll bars with 2# wts 4 lengths Standing for alt hip abd 2 # wts 10x Standing on airex heel/toe rocks 20x   11/23/23:  Instructed the pt in the following activities to address her LE strength, cardiovascular endurance: Knee flexion B, 20#, 3 x 12 Knee ext B, 10#, 3 x 12 Recumbent cycle level 2.2 5 min Nustep UE's and LE's level 5 x 6 min In ll bars for : heel/toe rocks on airex 20x Standing with 1 1/2# cuff wts on ankles, for alt hip abd, 10 x each Alt standing SLR, 10 x each Alt hip ext with knee extended, 10 x each  11/09/23: Recumbent bike 1.5, x 3 min 30 sec  Seated hamstring curls 20#, 3 x 10  Seated B knee ext 10# 3 x 10 Standing in ll bars with 2# cuff wts, alt hip abd, alt marching, alt hip and knee ext, 10 reps each Fast walking with HHA x 2 laps In ll bars on airex for heel toe rocks 10 x  In ll bars for step backs , lead foot on airex, 10 x each Nustep level 4 x 6 min  11/02/23 Nustep level 5 x 7.5 minutes Bike level 2 x 6 minutes Leg curls 20# 3x10 Leg extension 10# 2x10 Standing on airex 5# straight arm pulls CGA due to poor balance Calf stretch On airex ball toss CGA for balance Walking ball toss with CGA TUG 13 seconds Direction changes Cone toe touches with two canes and then with one Fast walk 2 laps with [facility]    10/26/23 Nustep level 5 x 7 minutes Standing red tband row Standing red tband extension Leg curls 20# 2x10 Leg extension 10# 2x10 2.5# marches  2.5# hip abduction Standing on airex some ball toss needed CGA due  to poor balance Calf stretches Side stepping in Pbars On airex reaching for numbers on wall On airex head turns Light HHA fast walk 2 laps, HR up to 130, recovery in less than 2 minutes, then did one more lap Cone toe touches with SPC and HHA  10/19/23:  Nustep L5 Ue's and LE's x 5 min 30 sec Patient demonstrated her home program:  performed 20 reps each: Seated long arc quads,  Seated heel lifts,  Seated ankle dorsiflexion Seated march Instructed in seated ankle plantarflexor stretch and hamstring stretch with strap Inst in standing heel drops off black bar, alt, 10 sec holds Standing in ll bars for heel/toe rocks with B hand hold 15 x Standing for penguins alt 15 x , B hand hold support in ll bars   10/12/23 Nustep level 4 x 5 minutes      PATIENT EDUCATION:  Education details: POC/HEP Person educated: Patient Education method: Programmer, multimedia, Facilities manager, Verbal cues, and Handouts Education comprehension: verbalized understanding  HOME EXERCISE PROGRAM: Access Code: [email or web address] URL: [email or web address] Date: 10/12/2023 Prepared by: Stacie Glaze  Exercises - Seated March  - 1 x daily - 7 x weekly - 2 sets - 10 reps - 3 hold - Seated Long Arc Quad  - 1 x daily - 7 x weekly - 2 sets - 10 reps - 3 hold - Seated Toe Raise  - 1 x daily - 7 x weekly - 2 sets - 10 reps - 30 hold - Seated Heel Raise  - 1 x daily - 7 x weekly - 2 sets - 10 reps - 3 hold  ASSESSMENT:  CLINICAL IMPRESSION: Patient is a 78 y.o. female who participated with physical therapy treatment for weakness, leg pain and unsteady gait. Her biggest c/o now is calf tightness and pain, she is very tight and I tried to show her a few different ways to stretch at home.  She does have some issues with SOB and LOB  OBJECTIVE IMPAIRMENTS: Abnormal gait, cardiopulmonary status limiting activity, decreased activity tolerance, decreased balance, decreased coordination, decreased endurance, decreased  mobility, difficulty walking, decreased ROM, decreased strength, decreased safety awareness, impaired flexibility, and pain.   REHAB POTENTIAL: Good  CLINICAL DECISION MAKING: Stable/uncomplicated  EVALUATION COMPLEXITY: Low   GOALS: Goals reviewed with patient? Yes  SHORT TERM GOALS: Target date: 11/01/23 Independent with initial HEP Baseline: not exercising Goal status:met 10/26/23   LONG TERM GOALS: Target date: 12/10/23  Independent with advanced HEP Goal status: progressing 12/14/23  2.  Decrease TUG to 13 seconds Baseline:  20 seconds Goal status: progressing 12/07/23  3.  Decrease 5XSTS to 21 seconds Baseline: 35 seconds with using hand to push up from armrest Goal status: progressing 12/07/23  4.  Increase Berg balance test score to 46/56 Baseline: 31/56 Goal status: INITIAL  5.  Walk safely >500 feet with SPC Goal status: progressing 12/07/23  PLAN:  PT FREQUENCY: 1-2x/week  PT DURATION: 12 weeks  PLANNED INTERVENTIONS: 97164- PT Re-evaluation, 97110-Therapeutic exercises, 97530- Therapeutic activity, 97112- Neuromuscular re-education, 97535- Self Care, 82956- Manual therapy, [phone]- Gait training, Patient/Family education, Balance training, Stair training, and Vestibular training  PLAN FOR NEXT SESSION: continue with therex to improve righting reactions, balance, activity tolerance.  Jearld Lesch, PT

## 2023-12-16 DIAGNOSIS — H5203 Hypermetropia, bilateral: Secondary | ICD-10-CM | POA: Diagnosis not present

## 2023-12-16 DIAGNOSIS — E119 Type 2 diabetes mellitus without complications: Secondary | ICD-10-CM | POA: Diagnosis not present

## 2023-12-16 DIAGNOSIS — H524 Presbyopia: Secondary | ICD-10-CM | POA: Diagnosis not present

## 2023-12-18 NOTE — Therapy (Signed)
 OUTPATIENT PHYSICAL THERAPY LOWER EXTREMITY TREATMENT Progress Note Reporting Period 10/12/23 to 12/21/23  See note below for Objective Data and Assessment of Progress/Goals.      Patient Name: Cathy Gonzalez MRN: [id number] DOB:April 11, 1946, 78 y.o., female Today's Date: 12/21/2023  END OF SESSION:  PT End of Session - 12/21/23 0838     Visit Number 10    Date for PT Re-Evaluation 01/10/24    Authorization Type HTA    PT Start Time 0845    PT Stop Time 0930    PT Time Calculation (min) 45 min    Activity Tolerance Patient tolerated treatment well    Behavior During Therapy [facility] for tasks assessed/performed                  Past Medical History:  Diagnosis Date   Arthritis    Cataract    left cataract removed in 2017   Diabetes mellitus    Diabetic neuropathy (HCC)    Diabetic neuropathy, type II diabetes mellitus (HCC)    Hepatitis C    took tx for    Hypertension    Hypokalemia    Knee pain    right    Morbidly obese (HCC)    Nocturnal seizures (HCC) 05/14/2015   no bad seizure since 1980's   OSA on CPAP    uses cpap 2-3 week   Plantar fasciitis    both feet   Past Surgical History:  Procedure Laterality Date   ABDOMINAL HYSTERECTOMY     partial   ANTERIOR CERVICAL DECOMP/DISCECTOMY FUSION N/A 02/24/2022   Procedure: CERVICAL FOUR- CERVICAL FIVE ANTERIOR CERVICAL DECOMPRESSION/DISCECTOMY FUSION ONE LEVEL WITH ALLOGRAFT AND PLATE;  Surgeon: Eldred Manges, MD;  Location: [facility] OR;  Service: Orthopedics;  Laterality: N/A;   CATARACT EXTRACTION Left    COLONOSCOPY WITH PROPOFOL N/A 08/21/2015   Procedure: COLONOSCOPY WITH PROPOFOL;  Surgeon: [name], MD;  Location: [facility];  Service: Endoscopy;  Laterality: N/A;   COLONOSCOPY WITH PROPOFOL N/A 08/22/2022   Procedure: COLONOSCOPY WITH PROPOFOL;  Surgeon: [name], MD;  Location: [facility];  Service: Gastroenterology;  Laterality: N/A;   ESOPHAGOGASTRODUODENOSCOPY (EGD) WITH PROPOFOL N/A  08/21/2015   Procedure: ESOPHAGOGASTRODUODENOSCOPY (EGD) WITH PROPOFOL;  Surgeon: [name], MD;  Location: [facility];  Service: Endoscopy;  Laterality: N/A;   ESOPHAGOGASTRODUODENOSCOPY (EGD) WITH PROPOFOL N/A 10/08/2018   Procedure: ESOPHAGOGASTRODUODENOSCOPY (EGD) WITH PROPOFOL;  Surgeon: [name], MD;  Location: [facility];  Service: Endoscopy;  Laterality: N/A;   ESOPHAGOGASTRODUODENOSCOPY (EGD) WITH PROPOFOL N/A 08/22/2022   Procedure: ESOPHAGOGASTRODUODENOSCOPY (EGD) WITH PROPOFOL;  Surgeon: [name], MD;  Location: [facility];  Service: Gastroenterology;  Laterality: N/A;   EYE SURGERY Bilateral    cataracts   POLYPECTOMY  08/22/2022   Procedure: POLYPECTOMY;  Surgeon: [name], MD;  Location: [facility];  Service: Gastroenterology;;   TUBAL LIGATION     Patient Active Problem List   Diagnosis Date Noted   Gouty neuritis (HCC) 03/16/2023   Paroxysmal A-fib (HCC) 03/16/2023   Pain in right hand 10/28/2022   Impingement syndrome of right shoulder 08/12/2022   S/P cervical spinal fusion 06/25/2022   Pain due to onychomycosis of toenails of both feet 05/06/2022   Cervical stenosis of spine 02/24/2022   Neck pain 12/03/2021   Abnormal liver function tests 02/12/2021   Diarrhea 02/12/2021   Gastro-esophageal reflux disease without esophagitis 02/12/2021   Periumbilical pain 02/12/2021   History of colonic polyps 02/12/2021   Portal hypertension (HCC) 02/12/2021  Screening for malignant neoplasm of colon 02/12/2021   Type 2 diabetes mellitus without complications (HCC) 02/12/2021   Primary osteoarthritis, left hand 11/07/2020   Arthritis of carpometacarpal (CMC) joint of thumb 07/18/2020   Bilateral primary osteoarthritis of knee 06/20/2020   Bilateral primary osteoarthritis of hip 06/07/2020   Right thigh pain 05/09/2020   Low back pain 07/14/2019   OSA on CPAP 05/16/2019   Diabetic neuropathy associated with type 2 diabetes mellitus (HCC) 12/13/2018    Chronic pain of right knee 12/09/2018   Thumb pain, left 12/09/2018   Dizziness and giddiness 01/14/2016   Hepatic cirrhosis (HCC) 07/26/2015   Nocturnal seizures (HCC) 05/14/2015   Chronic hepatitis C without hepatic coma (HCC) 03/14/2015   Vertigo, central 02/11/2014   Difficulty walking 02/11/2014   Diabetes mellitus (HCC) 02/11/2014   Dysphagia 02/11/2014   Class 3 severe obesity with serious comorbidity and body mass index (BMI) of 40.0 to 44.9 in adult, unspecified obesity type (HCC) 07/26/2010   Essential hypertension, benign 07/26/2010   DEGENERATIVE JOINT DISEASE, GENERALIZED 07/26/2010    PCP: Parke Simmers, MD  REFERRING PROVIDER: Parke Simmers, MD  REFERRING DIAG: unsteadiness with walking  THERAPY DIAG:  Difficulty in walking, not elsewhere classified  Muscle weakness (generalized)  Unsteady gait  Rationale for Evaluation and Treatment: Rehabilitation  ONSET DATE: 09/23/23  SUBJECTIVE:   SUBJECTIVE STATEMENT: I have been doing fine, no pain. This is the best I have felt in a long time.   PERTINENT HISTORY: See above PAIN:  Are you having pain? Yes: NPRS scale: 0/10 Pain location: feet and right knee Pain description: feet burn from neuropathy, right knee aches Aggravating factors: walking Relieving factors: rest  PRECAUTIONS: Fall  RED FLAGS: None   WEIGHT BEARING RESTRICTIONS: No  FALLS:  Has patient fallen in last 6 months? 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  DIAGNOSTIC FINDINGS:  none  COGNITION: Overall cognitive status: Within functional limits for tasks assessed     SENSATION: Can feel the calves, but has minimal sensation in the feet  EDEMA:  Has some compression stocking  MUSCLE LENGTH: Tight mms in the HS and piriformis  POSTURE: rounded shoulders and forward head  PALPATION: Some tenderness in the calves  LOWER EXTREMITY ROM:  WFL's  LOWER EXTREMITY MMT:  MMT Right eval Left eval  Hip flexion 4 4  Hip extension    Hip abduction    Hip adduction    Hip internal rotation    Hip external rotation    Knee flexion 4 4  Knee extension 4- 4-  Ankle dorsiflexion 4 4-  Ankle plantarflexion    Ankle inversion 4- 4-  Ankle eversion 4- 4-   (Blank rows = not tested) FUNCTIONAL TESTS:  5 times sit to stand: 35 seconds had to use arm rests to push up with hands Timed up and go (TUG): 20 seconds, limp on the right, unsteady reached out and touched the walls and the door frames, 11/02/23 = 13 seconds Berg Balance Scale: 31/56  GAIT: Distance walked: 70 feet Assistive device utilized: None Level  of assistance: CGA Comments: tends to use walls , unsteady, limps on the right                                                                                                                                TREATMENT DATE:  12/21/23 Recheck goals  NuStep L5x50mins  Leg ext 5# 2x10 HS curls 25# 2x10 Calf stretch on black bar 15s x3  Calf raises on bar 2x10  12/14/23 Nustep level 5 x 7 minutes Slant board calf stretches Passive Calf stretches HS curls 25# 3x10 Leg extension 5# 3x10 Seated pball isometric abs On airex 5# straight arm pulls Ball toss and catch HHA side step, fwd and backward walking Cone toe touches with SPC Education on HEP and calf stretches Walking 2 laps 200 feet with SPC and SBA On airex balance beam side stepping  12/07/23 Nustep level 5 x 7 minutes Slant board calf stretches On airex 5# straight arm pulls needed CGA due  to poor balance Knee flexion 25# 3x10 Leg extension 5# 3x10 On and off airex ball toss, very scary for her Side stepping on airex balance beam On airex reaching for numbers on wall Walking ball toss 1 lap and needed rest Standing 2.5# marching, hip abduction and extension Walking direction changes with HHA Holding onto [facility] 6" toe touches  11/30/23: Instructed/progressed the pt in the following activities to address her LE strength, cardiovascular endurance, and stability/balance: Recumbent cycle 5 min Nustep 6 min B Knee flexion 35# 2 x 10 B knee ext 10# 3 x 10 Standing in ll bars with 2 # cuff wts ankles, alt SLR, cues to maintain full knee extension throughout 10x Hip ext, alt with cues again for full ext knees 10x Side stepping in ll bars with 2# wts 4 lengths Standing for alt hip abd 2 # wts 10x Standing on airex heel/toe rocks 20x   11/23/23:  Instructed the pt in the following activities to address her LE strength, cardiovascular endurance: Knee flexion B, 20#, 3 x 12 Knee ext B, 10#, 3 x 12 Recumbent cycle level 2.2 5 min Nustep UE's and LE's level 5 x 6 min In ll bars for : heel/toe rocks on airex 20x Standing with 1 1/2# cuff wts on ankles, for alt hip abd, 10 x each Alt standing SLR, 10 x each Alt hip ext with knee extended, 10 x each  11/09/23: Recumbent bike 1.5, x 3 min 30 sec  Seated hamstring curls 20#, 3 x 10  Seated B knee ext 10# 3 x 10 Standing in ll bars with 2# cuff wts, alt hip abd, alt marching, alt hip and knee ext, 10 reps each Fast walking with HHA x 2 laps In ll bars on airex for heel toe rocks 10 x  In ll bars for step backs , lead foot on airex, 10 x each Nustep level 4 x 6 min  11/02/23 Nustep level 5 x 7.5  minutes Bike level 2 x 6 minutes Leg curls 20# 3x10 Leg extension 10# 2x10 Standing on airex 5# straight arm pulls CGA due to poor balance Calf stretch On airex ball toss CGA for balance Walking ball toss with CGA TUG 13  seconds Direction changes Cone toe touches with two canes and then with one Fast walk 2 laps with [facility]    10/26/23 Nustep level 5 x 7 minutes Standing red tband row Standing red tband extension Leg curls 20# 2x10 Leg extension 10# 2x10 2.5# marches  2.5# hip abduction Standing on airex some ball toss needed CGA due to poor balance Calf stretches Side stepping in Pbars On airex reaching for numbers on wall On airex head turns Light HHA fast walk 2 laps, HR up to 130, recovery in less than 2 minutes, then did one more lap Cone toe touches with SPC and HHA  10/19/23:  Nustep L5 Ue's and LE's x 5 min 30 sec Patient demonstrated her home program: performed 20 reps each: Seated long arc quads,  Seated heel lifts,  Seated ankle dorsiflexion Seated march Instructed in seated ankle plantarflexor stretch and hamstring stretch with strap Inst in standing heel drops off black bar, alt, 10 sec holds Standing in ll bars for heel/toe rocks with B hand hold 15 x Standing for penguins alt 15 x , B hand hold support in ll bars   10/12/23 Nustep level 4 x 5 minutes      PATIENT EDUCATION:  Education details: POC/HEP Person educated: Patient Education method: Programmer, multimedia, Facilities manager, Verbal cues, and Handouts Education comprehension: verbalized understanding  HOME EXERCISE PROGRAM: Access Code: [email or web address] URL: [email or web address] Date: 10/12/2023 Prepared by: Stacie Glaze  Exercises - Seated March  - 1 x daily - 7 x weekly - 2 sets - 10 reps - 3 hold - Seated Long Arc Quad  - 1 x daily - 7 x weekly - 2 sets - 10 reps - 3 hold - Seated Toe Raise  - 1 x daily - 7 x weekly - 2 sets - 10 reps - 30 hold - Seated Heel Raise  - 1 x daily - 7 x weekly - 2 sets - 10 reps - 3 hold  ASSESSMENT:  CLINICAL IMPRESSION: Patient is a 78 y.o. female who participated with physical therapy treatment for weakness, leg pain and unsteady gait. He has met some of her LTGs for 10th  visit progress note, and reports she feels that she is getting better.  She still has some issues with SOB and LOB. Worked on some LE strengthening. Reports calf burning at end of session after calf raises. Would benefit from more balance interventions next visit.   OBJECTIVE IMPAIRMENTS: Abnormal gait, cardiopulmonary status limiting activity, decreased activity tolerance, decreased balance, decreased coordination, decreased endurance, decreased mobility, difficulty walking, decreased ROM, decreased strength, decreased safety awareness, impaired flexibility, and pain.   REHAB POTENTIAL: Good  CLINICAL DECISION MAKING: Stable/uncomplicated  EVALUATION COMPLEXITY: Low   GOALS: Goals reviewed with patient? Yes  SHORT TERM GOALS: Target date: 11/01/23 Independent with initial HEP Baseline: not exercising Goal status:met 10/26/23   LONG TERM GOALS: Target date: 01/10/24  Independent with advanced HEP Goal status: progressing 12/14/23  2.  Decrease TUG to 13 seconds Baseline:  20 seconds Goal status: progressing 12/07/23, 10.07s 12/21/23  3.  Decrease 5XSTS to 21 seconds Baseline: 35 seconds with using hand to push up from armrest Goal status: progressing 12/07/23, 21.67s w/o UE push off but uses legs against table 12/21/23  4.  Increase Berg balance test score to 46/56 Baseline: 31/56 Goal status: 34/56 progressing 12/21/23  5.  Walk safely >500 feet with SPC Goal status: progressing 12/07/23, 3 laps ~379ft with SPC before needing a break, able to do 2 more after resting 12/21/23   PLAN:  PT FREQUENCY: 1-2x/week  PT DURATION: 12 weeks  PLANNED INTERVENTIONS: 97164- PT Re-evaluation, 97110-Therapeutic exercises, 97530- Therapeutic activity, 97112- Neuromuscular re-education, 97535- Self Care, 16109- Manual therapy, [phone]- Gait training, Patient/Family education, Balance training, Stair training, and Vestibular training  PLAN FOR NEXT SESSION: continue with therex to improve righting  reactions, balance, activity tolerance. Cone taps, side steps on airex in bars, marching on airex  Cassie Freer, PT

## 2023-12-21 ENCOUNTER — Ambulatory Visit

## 2023-12-21 DIAGNOSIS — R262 Difficulty in walking, not elsewhere classified: Secondary | ICD-10-CM | POA: Diagnosis not present

## 2023-12-21 DIAGNOSIS — R2681 Unsteadiness on feet: Secondary | ICD-10-CM

## 2023-12-21 DIAGNOSIS — M6281 Muscle weakness (generalized): Secondary | ICD-10-CM

## 2023-12-24 NOTE — Therapy (Signed)
 OUTPATIENT PHYSICAL THERAPY LOWER EXTREMITY TREATMENT   Patient Name: Cathy Gonzalez MRN: [id number] DOB:30-Apr-1946, 78 y.o., female Today's Date: 12/28/2023  END OF SESSION:  PT End of Session - 12/28/23 0805     Visit Number 11    Date for PT Re-Evaluation 01/10/24    Authorization Type HTA    PT Start Time 0805    PT Stop Time 0840    PT Time Calculation (min) 35 min    Activity Tolerance Patient tolerated treatment well    Behavior During Therapy [facility] for tasks assessed/performed                   Past Medical History:  Diagnosis Date   Arthritis    Cataract    left cataract removed in 2017   Diabetes mellitus    Diabetic neuropathy (HCC)    Diabetic neuropathy, type II diabetes mellitus (HCC)    Hepatitis C    took tx for    Hypertension    Hypokalemia    Knee pain    right    Morbidly obese (HCC)    Nocturnal seizures (HCC) 05/14/2015   no bad seizure since 1980's   OSA on CPAP    uses cpap 2-3 week   Plantar fasciitis    both feet   Past Surgical History:  Procedure Laterality Date   ABDOMINAL HYSTERECTOMY     partial   ANTERIOR CERVICAL DECOMP/DISCECTOMY FUSION N/A 02/24/2022   Procedure: CERVICAL FOUR- CERVICAL FIVE ANTERIOR CERVICAL DECOMPRESSION/DISCECTOMY FUSION ONE LEVEL WITH ALLOGRAFT AND PLATE;  Surgeon: Eldred Manges, MD;  Location: [facility] OR;  Service: Orthopedics;  Laterality: N/A;   CATARACT EXTRACTION Left    COLONOSCOPY WITH PROPOFOL N/A 08/21/2015   Procedure: COLONOSCOPY WITH PROPOFOL;  Surgeon: [name], MD;  Location: [facility];  Service: Endoscopy;  Laterality: N/A;   COLONOSCOPY WITH PROPOFOL N/A 08/22/2022   Procedure: COLONOSCOPY WITH PROPOFOL;  Surgeon: [name], MD;  Location: [facility];  Service: Gastroenterology;  Laterality: N/A;   ESOPHAGOGASTRODUODENOSCOPY (EGD) WITH PROPOFOL N/A 08/21/2015   Procedure: ESOPHAGOGASTRODUODENOSCOPY (EGD) WITH PROPOFOL;  Surgeon: [name], MD;  Location: [facility];   Service: Endoscopy;  Laterality: N/A;   ESOPHAGOGASTRODUODENOSCOPY (EGD) WITH PROPOFOL N/A 10/08/2018   Procedure: ESOPHAGOGASTRODUODENOSCOPY (EGD) WITH PROPOFOL;  Surgeon: [name], MD;  Location: [facility];  Service: Endoscopy;  Laterality: N/A;   ESOPHAGOGASTRODUODENOSCOPY (EGD) WITH PROPOFOL N/A 08/22/2022   Procedure: ESOPHAGOGASTRODUODENOSCOPY (EGD) WITH PROPOFOL;  Surgeon: [name], MD;  Location: [facility];  Service: Gastroenterology;  Laterality: N/A;   EYE SURGERY Bilateral    cataracts   POLYPECTOMY  08/22/2022   Procedure: POLYPECTOMY;  Surgeon: [name], MD;  Location: [facility];  Service: Gastroenterology;;   TUBAL LIGATION     Patient Active Problem List   Diagnosis Date Noted   Gouty neuritis (HCC) 03/16/2023   Paroxysmal A-fib (HCC) 03/16/2023   Pain in right hand 10/28/2022   Impingement syndrome of right shoulder 08/12/2022   S/P cervical spinal fusion 06/25/2022   Pain due to onychomycosis of toenails of both feet 05/06/2022   Cervical stenosis of spine 02/24/2022   Neck pain 12/03/2021   Abnormal liver function tests 02/12/2021   Diarrhea 02/12/2021   Gastro-esophageal reflux disease without esophagitis 02/12/2021   Periumbilical pain 02/12/2021   History of colonic polyps 02/12/2021   Portal hypertension (HCC) 02/12/2021   Screening for malignant neoplasm of colon 02/12/2021   Type 2 diabetes mellitus without complications (HCC) 02/12/2021  Primary osteoarthritis, left hand 11/07/2020   Arthritis of carpometacarpal [facility]) joint of thumb 07/18/2020   Bilateral primary osteoarthritis of knee 06/20/2020   Bilateral primary osteoarthritis of hip 06/07/2020   Right thigh pain 05/09/2020   Low back pain 07/14/2019   OSA on CPAP 05/16/2019   Diabetic neuropathy associated with type 2 diabetes mellitus (HCC) 12/13/2018   Chronic pain of right knee 12/09/2018   Thumb pain, left 12/09/2018   Dizziness and giddiness 01/14/2016   Hepatic  cirrhosis (HCC) 07/26/2015   Nocturnal seizures (HCC) 05/14/2015   Chronic hepatitis C without hepatic coma (HCC) 03/14/2015   Vertigo, central 02/11/2014   Difficulty walking 02/11/2014   Diabetes mellitus (HCC) 02/11/2014   Dysphagia 02/11/2014   Class 3 severe obesity with serious comorbidity and body mass index (BMI) of 40.0 to 44.9 in adult, unspecified obesity type (HCC) 07/26/2010   Essential hypertension, benign 07/26/2010   DEGENERATIVE JOINT DISEASE, GENERALIZED 07/26/2010    PCP: Parke Simmers, MD  REFERRING PROVIDER: Parke Simmers, MD  REFERRING DIAG: unsteadiness with walking  THERAPY DIAG:  Difficulty in walking, not elsewhere classified  Muscle weakness (generalized)  Unsteady gait  Rationale for Evaluation and Treatment: Rehabilitation  ONSET DATE: 09/23/23  SUBJECTIVE:   SUBJECTIVE STATEMENT: I have a cold, but I did not feel like cancelling so I can do a little bit. My knees are hurting, it's a 9/10.   PERTINENT HISTORY: See above PAIN:  Are you having pain? Yes: NPRS scale: 0/10 Pain location: feet and right knee Pain description: feet burn from neuropathy, right knee aches Aggravating factors: walking Relieving factors: rest  PRECAUTIONS: Fall  RED FLAGS: None   WEIGHT BEARING RESTRICTIONS: No  FALLS:  Has patient fallen in last 6 months? 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  DIAGNOSTIC FINDINGS: none  COGNITION: Overall cognitive status: Within functional limits for tasks  assessed     SENSATION: Can feel the calves, but has minimal sensation in the feet  EDEMA:  Has some compression stocking  MUSCLE LENGTH: Tight mms in the HS and piriformis  POSTURE: rounded shoulders and forward head  PALPATION: Some tenderness in the calves  LOWER EXTREMITY ROM:  WFL's  LOWER EXTREMITY MMT:  MMT Right eval Left eval  Hip flexion 4 4  Hip extension    Hip abduction    Hip adduction    Hip internal rotation    Hip external rotation    Knee flexion 4 4  Knee extension 4- 4-  Ankle dorsiflexion 4 4-  Ankle plantarflexion    Ankle inversion 4- 4-  Ankle eversion 4- 4-   (Blank rows = not tested) FUNCTIONAL TESTS:  5 times sit to stand: 35 seconds had to use arm rests to push up with hands Timed up and go (TUG): 20 seconds, limp on the right, unsteady reached out and touched the walls and the door frames, 11/02/23 = 13 seconds Berg Balance Scale: 31/56  GAIT: Distance walked: 70 feet Assistive device utilized: None Level of assistance: CGA Comments: tends to use walls , unsteady, limps on  the right                                                                                                                                TREATMENT DATE:  12/28/23 NuStep L5x89mins  Walking on beam Step ups on side steps on airex  On airex marching On airex reaching for numbers   12/21/23 Recheck goals  NuStep L5x53mins  Leg ext 5# 2x10 HS curls 25# 2x10 Calf stretch on black bar 15s x3  Calf raises on bar 2x10  12/14/23 Nustep level 5 x 7 minutes Slant board calf stretches Passive Calf stretches HS curls 25# 3x10 Leg extension 5# 3x10 Seated pball isometric abs On airex 5# straight arm pulls Ball toss and catch HHA side step, fwd and backward walking Cone toe touches with SPC Education on HEP and calf stretches Walking 2 laps 200 feet with SPC and SBA On airex balance beam side stepping  12/07/23 Nustep level 5 x 7 minutes Slant board calf  stretches On airex 5# straight arm pulls needed CGA due to poor balance Knee flexion 25# 3x10 Leg extension 5# 3x10 On and off airex ball toss, very scary for her Side stepping on airex balance beam On airex reaching for numbers on wall Walking ball toss 1 lap and needed rest Standing 2.5# marching, hip abduction and extension Walking direction changes with HHA Holding onto [facility] 6" toe touches  11/30/23: Instructed/progressed the pt in the following activities to address her LE strength, cardiovascular endurance, and stability/balance: Recumbent cycle 5 min Nustep 6 min B Knee flexion 35# 2 x 10 B knee ext 10# 3 x 10 Standing in ll bars with 2 # cuff wts ankles, alt SLR, cues to maintain full knee extension throughout 10x Hip ext, alt with cues again for full ext knees 10x Side stepping in ll bars with 2# wts 4 lengths Standing for alt hip abd 2 # wts 10x Standing on airex heel/toe rocks 20x   11/23/23:  Instructed the pt in the following activities to address her LE strength, cardiovascular endurance: Knee flexion B, 20#, 3 x 12 Knee ext B, 10#, 3 x 12 Recumbent cycle level 2.2 5 min Nustep UE's and LE's level 5 x 6 min In ll bars for : heel/toe rocks on airex 20x Standing with 1 1/2# cuff wts on ankles, for alt hip abd, 10 x each Alt standing SLR, 10 x each Alt hip ext with knee extended, 10 x each  11/09/23: Recumbent bike 1.5, x 3 min 30 sec  Seated hamstring curls 20#, 3 x 10  Seated B knee ext 10# 3 x 10 Standing in ll bars with 2# cuff wts, alt hip abd, alt marching, alt hip and knee ext, 10 reps each Fast walking with HHA x 2 laps In ll bars on airex for heel toe rocks 10 x  In ll bars for step backs , lead foot on airex, 10 x each  Nustep level 4 x 6 min  11/02/23 Nustep level 5 x 7.5 minutes Bike level 2 x 6 minutes Leg curls 20# 3x10 Leg extension 10# 2x10 Standing on airex 5# straight arm pulls CGA due to poor balance Calf stretch On airex ball toss CGA for  balance Walking ball toss with CGA TUG 13 seconds Direction changes Cone toe touches with two canes and then with one Fast walk 2 laps with [facility]    10/26/23 Nustep level 5 x 7 minutes Standing red tband row Standing red tband extension Leg curls 20# 2x10 Leg extension 10# 2x10 2.5# marches  2.5# hip abduction Standing on airex some ball toss needed CGA due to poor balance Calf stretches Side stepping in Pbars On airex reaching for numbers on wall On airex head turns Light HHA fast walk 2 laps, HR up to 130, recovery in less than 2 minutes, then did one more lap Cone toe touches with SPC and HHA  10/19/23:  Nustep L5 Ue's and LE's x 5 min 30 sec Patient demonstrated her home program: performed 20 reps each: Seated long arc quads,  Seated heel lifts,  Seated ankle dorsiflexion Seated march Instructed in seated ankle plantarflexor stretch and hamstring stretch with strap Inst in standing heel drops off black bar, alt, 10 sec holds Standing in ll bars for heel/toe rocks with B hand hold 15 x Standing for penguins alt 15 x , B hand hold support in ll bars   10/12/23 Nustep level 4 x 5 minutes      PATIENT EDUCATION:  Education details: POC/HEP Person educated: Patient Education method: Programmer, multimedia, Facilities manager, Verbal cues, and Handouts Education comprehension: verbalized understanding  HOME EXERCISE PROGRAM: Access Code: [email or web address] URL: [email or web address] Date: 10/12/2023 Prepared by: Stacie Glaze  Exercises - Seated March  - 1 x daily - 7 x weekly - 2 sets - 10 reps - 3 hold - Seated Long Arc Quad  - 1 x daily - 7 x weekly - 2 sets - 10 reps - 3 hold - Seated Toe Raise  - 1 x daily - 7 x weekly - 2 sets - 10 reps - 30 hold - Seated Heel Raise  - 1 x daily - 7 x weekly - 2 sets - 10 reps - 3 hold  ASSESSMENT:  CLINICAL IMPRESSION: Patient is a 78 y.o. female who participated with physical therapy treatment for weakness, leg pain and unsteady  gait. She states she has a cold and does not feel good. Reports she is weak today and her knees are hurting.  Took it easy today and she needed more rest breaks and restroom breaks today. States the knees are easing off by end of session. Visit cut short due to patient not feeling well.    OBJECTIVE IMPAIRMENTS: Abnormal gait, cardiopulmonary status limiting activity, decreased activity tolerance, decreased balance, decreased coordination, decreased endurance, decreased mobility, difficulty walking, decreased ROM, decreased strength, decreased safety awareness, impaired flexibility, and pain.   REHAB POTENTIAL: Good  CLINICAL DECISION MAKING: Stable/uncomplicated  EVALUATION COMPLEXITY: Low   GOALS: Goals reviewed with patient? Yes  SHORT TERM GOALS: Target date: 11/01/23 Independent with initial HEP Baseline: not exercising Goal status:met 10/26/23   LONG TERM GOALS: Target date: 01/10/24  Independent with advanced HEP Goal status: progressing 12/14/23  2.  Decrease TUG to 13 seconds Baseline:  20 seconds Goal status: progressing 12/07/23, 10.07s 12/21/23  3.  Decrease 5XSTS to 21 seconds Baseline: 35 seconds with using hand to push up from armrest Goal  status: progressing 12/07/23, 21.67s w/o UE push off but uses legs against table 12/21/23  4.  Increase Berg balance test score to 46/56 Baseline: 31/56 Goal status: 34/56 progressing 12/21/23  5.  Walk safely >500 feet with SPC Goal status: progressing 12/07/23, 3 laps ~357ft with SPC before needing a break, able to do 2 more after resting 12/21/23   PLAN:  PT FREQUENCY: 1-2x/week  PT DURATION: 12 weeks  PLANNED INTERVENTIONS: 97164- PT Re-evaluation, 97110-Therapeutic exercises, 97530- Therapeutic activity, 97112- Neuromuscular re-education, 97535- Self Care, 96045- Manual therapy, [phone]- Gait training, Patient/Family education, Balance training, Stair training, and Vestibular training  PLAN FOR NEXT SESSION: continue with therex  to improve righting reactions, balance, activity tolerance. Cone taps, side steps on airex in bars, marching on airex  Cassie Freer, PT

## 2023-12-25 ENCOUNTER — Other Ambulatory Visit: Payer: Self-pay

## 2023-12-26 ENCOUNTER — Other Ambulatory Visit (HOSPITAL_COMMUNITY): Payer: Self-pay

## 2023-12-26 ENCOUNTER — Encounter (HOSPITAL_COMMUNITY): Payer: Self-pay | Admitting: Pharmacist

## 2023-12-26 MED ORDER — GLUCOSE BLOOD VI STRP
ORAL_STRIP | 6 refills | Status: DC
  Filled 2023-12-26: qty 100, 25d supply, fill #0
  Filled 2024-01-11 – 2024-01-18 (×2): qty 100, 25d supply, fill #1
  Filled 2024-02-13: qty 100, 25d supply, fill #2
  Filled 2024-03-11: qty 100, 25d supply, fill #3
  Filled 2024-03-30: qty 100, 25d supply, fill #4
  Filled 2024-05-05: qty 100, 25d supply, fill #5
  Filled 2024-06-02 – 2024-06-30 (×2): qty 100, 25d supply, fill #6

## 2023-12-28 ENCOUNTER — Other Ambulatory Visit (HOSPITAL_COMMUNITY): Payer: Self-pay

## 2023-12-28 ENCOUNTER — Ambulatory Visit

## 2023-12-28 ENCOUNTER — Other Ambulatory Visit: Payer: Self-pay

## 2023-12-28 DIAGNOSIS — M130 Polyarthritis, unspecified: Secondary | ICD-10-CM | POA: Diagnosis not present

## 2023-12-28 DIAGNOSIS — E1169 Type 2 diabetes mellitus with other specified complication: Secondary | ICD-10-CM | POA: Diagnosis not present

## 2023-12-28 DIAGNOSIS — M6281 Muscle weakness (generalized): Secondary | ICD-10-CM

## 2023-12-28 DIAGNOSIS — R2681 Unsteadiness on feet: Secondary | ICD-10-CM

## 2023-12-28 DIAGNOSIS — R262 Difficulty in walking, not elsewhere classified: Secondary | ICD-10-CM | POA: Diagnosis not present

## 2023-12-28 DIAGNOSIS — J399 Disease of upper respiratory tract, unspecified: Secondary | ICD-10-CM | POA: Diagnosis not present

## 2023-12-28 MED ORDER — NOREL AD 4-10-325 MG PO TABS
1.0000 | ORAL_TABLET | Freq: Four times a day (QID) | ORAL | 4 refills | Status: DC
Start: 2023-12-28 — End: 2024-04-01
  Filled 2023-12-28: qty 20, 5d supply, fill #0
  Filled 2024-01-11: qty 20, 5d supply, fill #1
  Filled 2024-01-25: qty 20, 5d supply, fill #2
  Filled 2024-02-13: qty 20, 5d supply, fill #3
  Filled 2024-03-11: qty 20, 5d supply, fill #4

## 2023-12-28 MED ORDER — MELOXICAM 15 MG PO TABS
15.0000 mg | ORAL_TABLET | Freq: Every day | ORAL | 0 refills | Status: AC
  Filled 2023-12-28 (×2): qty 30, 30d supply, fill #0

## 2024-01-04 ENCOUNTER — Ambulatory Visit: Admitting: Physical Therapy

## 2024-01-04 ENCOUNTER — Encounter: Payer: Self-pay | Admitting: Physical Therapy

## 2024-01-04 DIAGNOSIS — R2681 Unsteadiness on feet: Secondary | ICD-10-CM

## 2024-01-04 DIAGNOSIS — M6281 Muscle weakness (generalized): Secondary | ICD-10-CM

## 2024-01-04 DIAGNOSIS — R262 Difficulty in walking, not elsewhere classified: Secondary | ICD-10-CM

## 2024-01-04 NOTE — Therapy (Signed)
 OUTPATIENT PHYSICAL THERAPY LOWER EXTREMITY TREATMENT   Patient Name: Cathy Gonzalez MRN: [id number] DOB:27-Jun-1946, 78 y.o., female Today's Date: 01/04/2024  END OF SESSION:  PT End of Session - 01/04/24 0803     Visit Number 12    Date for PT Re-Evaluation 01/10/24    Authorization Type HTA    PT Start Time 0803    PT Stop Time 0845    PT Time Calculation (min) 42 min    Activity Tolerance Patient tolerated treatment well    Behavior During Therapy [facility] for tasks assessed/performed            Past Medical History:  Diagnosis Date   Arthritis    Cataract    left cataract removed in 2017   Diabetes mellitus    Diabetic neuropathy (HCC)    Diabetic neuropathy, type II diabetes mellitus (HCC)    Hepatitis C    took tx for    Hypertension    Hypokalemia    Knee pain    right    Morbidly obese (HCC)    Nocturnal seizures (HCC) 05/14/2015   no bad seizure since 1980's   OSA on CPAP    uses cpap 2-3 week   Plantar fasciitis    both feet   Past Surgical History:  Procedure Laterality Date   ABDOMINAL HYSTERECTOMY     partial   ANTERIOR CERVICAL DECOMP/DISCECTOMY FUSION N/A 02/24/2022   Procedure: CERVICAL FOUR- CERVICAL FIVE ANTERIOR CERVICAL DECOMPRESSION/DISCECTOMY FUSION ONE LEVEL WITH ALLOGRAFT AND PLATE;  Surgeon: Eldred Manges, MD;  Location: [facility] OR;  Service: Orthopedics;  Laterality: N/A;   CATARACT EXTRACTION Left    COLONOSCOPY WITH PROPOFOL N/A 08/21/2015   Procedure: COLONOSCOPY WITH PROPOFOL;  Surgeon: [name], MD;  Location: [facility];  Service: Endoscopy;  Laterality: N/A;   COLONOSCOPY WITH PROPOFOL N/A 08/22/2022   Procedure: COLONOSCOPY WITH PROPOFOL;  Surgeon: [name], MD;  Location: [facility];  Service: Gastroenterology;  Laterality: N/A;   ESOPHAGOGASTRODUODENOSCOPY (EGD) WITH PROPOFOL N/A 08/21/2015   Procedure: ESOPHAGOGASTRODUODENOSCOPY (EGD) WITH PROPOFOL;  Surgeon: [name], MD;  Location: [facility];  Service: Endoscopy;   Laterality: N/A;   ESOPHAGOGASTRODUODENOSCOPY (EGD) WITH PROPOFOL N/A 10/08/2018   Procedure: ESOPHAGOGASTRODUODENOSCOPY (EGD) WITH PROPOFOL;  Surgeon: [name], MD;  Location: [facility];  Service: Endoscopy;  Laterality: N/A;   ESOPHAGOGASTRODUODENOSCOPY (EGD) WITH PROPOFOL N/A 08/22/2022   Procedure: ESOPHAGOGASTRODUODENOSCOPY (EGD) WITH PROPOFOL;  Surgeon: [name], MD;  Location: [facility];  Service: Gastroenterology;  Laterality: N/A;   EYE SURGERY Bilateral    cataracts   POLYPECTOMY  08/22/2022   Procedure: POLYPECTOMY;  Surgeon: [name], MD;  Location: [facility];  Service: Gastroenterology;;   TUBAL LIGATION     Patient Active Problem List   Diagnosis Date Noted   Gouty neuritis (HCC) 03/16/2023   Paroxysmal A-fib (HCC) 03/16/2023   Pain in right hand 10/28/2022   Impingement syndrome of right shoulder 08/12/2022   S/P cervical spinal fusion 06/25/2022   Pain due to onychomycosis of toenails of both feet 05/06/2022   Cervical stenosis of spine 02/24/2022   Neck pain 12/03/2021   Abnormal liver function tests 02/12/2021   Diarrhea 02/12/2021   Gastro-esophageal reflux disease without esophagitis 02/12/2021   Periumbilical pain 02/12/2021   History of colonic polyps 02/12/2021   Portal hypertension (HCC) 02/12/2021   Screening for malignant neoplasm of colon 02/12/2021   Type 2 diabetes mellitus without complications (HCC) 02/12/2021   Primary osteoarthritis, left hand 11/07/2020  Arthritis of carpometacarpal (CMC) joint of thumb 07/18/2020   Bilateral primary osteoarthritis of knee 06/20/2020   Bilateral primary osteoarthritis of hip 06/07/2020   Right thigh pain 05/09/2020   Low back pain 07/14/2019   OSA on CPAP 05/16/2019   Diabetic neuropathy associated with type 2 diabetes mellitus (HCC) 12/13/2018   Chronic pain of right knee 12/09/2018   Thumb pain, left 12/09/2018   Dizziness and giddiness 01/14/2016   Hepatic cirrhosis (HCC) 07/26/2015    Nocturnal seizures (HCC) 05/14/2015   Chronic hepatitis C without hepatic coma (HCC) 03/14/2015   Vertigo, central 02/11/2014   Difficulty walking 02/11/2014   Diabetes mellitus (HCC) 02/11/2014   Dysphagia 02/11/2014   Class 3 severe obesity with serious comorbidity and body mass index (BMI) of 40.0 to 44.9 in adult, unspecified obesity type (HCC) 07/26/2010   Essential hypertension, benign 07/26/2010   DEGENERATIVE JOINT DISEASE, GENERALIZED 07/26/2010    PCP: Parke Simmers, MD  REFERRING PROVIDER: Parke Simmers, MD  REFERRING DIAG: unsteadiness with walking  THERAPY DIAG:  Difficulty in walking, not elsewhere classified  Muscle weakness (generalized)  Unsteady gait  Rationale for Evaluation and Treatment: Rehabilitation  ONSET DATE: 09/23/23  SUBJECTIVE:   SUBJECTIVE STATEMENT: I have been doing okay, had less knee pain recently. PERTINENT HISTORY: See above PAIN:  Are you having pain? Yes: NPRS scale: 0/10 Pain location: feet and right knee Pain description: feet burn from neuropathy, right knee aches Aggravating factors: walking Relieving factors: rest  PRECAUTIONS: Fall  RED FLAGS: None   WEIGHT BEARING RESTRICTIONS: No  FALLS:  Has patient fallen in last 6 months? 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  DIAGNOSTIC FINDINGS: none  COGNITION: Overall cognitive status: Within functional limits for tasks assessed     SENSATION: Can feel the calves, but has minimal sensation in the feet  EDEMA:  Has some  compression stocking  MUSCLE LENGTH: Tight mms in the HS and piriformis  POSTURE: rounded shoulders and forward head  PALPATION: Some tenderness in the calves  LOWER EXTREMITY ROM:  WFL's  LOWER EXTREMITY MMT:  MMT Right eval Left eval  Hip flexion 4 4  Hip extension    Hip abduction    Hip adduction    Hip internal rotation    Hip external rotation    Knee flexion 4 4  Knee extension 4- 4-  Ankle dorsiflexion 4 4-  Ankle plantarflexion    Ankle inversion 4- 4-  Ankle eversion 4- 4-   (Blank rows = not tested) FUNCTIONAL TESTS:  5 times sit to stand: 35 seconds had to use arm rests to push up with hands Timed up and go (TUG): 20 seconds, limp on the right, unsteady reached out and touched the walls and the door frames, 11/02/23 = 13 seconds Berg Balance Scale: 31/56  GAIT: Distance walked: 70 feet Assistive device utilized: None Level of assistance: CGA Comments: tends to use walls , unsteady, limps on the right  TREATMENT DATE:  01/04/24 Nustep level 5 x 6 minutes Gait outside wth [facility], at times needed CGA especially on slope and with curbs 2 rest breaks Slant board calf stretch HS curls 25# 2x10 Leg extension 5# 2x10 Ball b/n knees squeeze Green tband clamshells 2# marches  12/28/23 NuStep L5x79mins  Walking on beam Step ups on side steps on airex  On airex marching On airex reaching for numbers  12/21/23 Recheck goals  NuStep L5x82mins  Leg ext 5# 2x10 HS curls 25# 2x10 Calf stretch on black bar 15s x3  Calf raises on bar 2x10  12/14/23 Nustep level 5 x 7 minutes Slant board calf stretches Passive Calf stretches HS curls 25# 3x10 Leg extension 5# 3x10 Seated pball isometric abs On airex 5# straight arm pulls Ball toss and catch HHA side step, fwd and backward walking Cone toe touches with SPC Education on HEP and calf  stretches Walking 2 laps 200 feet with SPC and SBA On airex balance beam side stepping  12/07/23 Nustep level 5 x 7 minutes Slant board calf stretches On airex 5# straight arm pulls needed CGA due to poor balance Knee flexion 25# 3x10 Leg extension 5# 3x10 On and off airex ball toss, very scary for her Side stepping on airex balance beam On airex reaching for numbers on wall Walking ball toss 1 lap and needed rest Standing 2.5# marching, hip abduction and extension Walking direction changes with HHA Holding onto [facility] 6" toe touches  11/30/23: Instructed/progressed the pt in the following activities to address her LE strength, cardiovascular endurance, and stability/balance: Recumbent cycle 5 min Nustep 6 min B Knee flexion 35# 2 x 10 B knee ext 10# 3 x 10 Standing in ll bars with 2 # cuff wts ankles, alt SLR, cues to maintain full knee extension throughout 10x Hip ext, alt with cues again for full ext knees 10x Side stepping in ll bars with 2# wts 4 lengths Standing for alt hip abd 2 # wts 10x Standing on airex heel/toe rocks 20x  11/23/23:  Instructed the pt in the following activities to address her LE strength, cardiovascular endurance: Knee flexion B, 20#, 3 x 12 Knee ext B, 10#, 3 x 12 Recumbent cycle level 2.2 5 min Nustep UE's and LE's level 5 x 6 min In ll bars for : heel/toe rocks on airex 20x Standing with 1 1/2# cuff wts on ankles, for alt hip abd, 10 x each Alt standing SLR, 10 x each Alt hip ext with knee extended, 10 x each  11/09/23: Recumbent bike 1.5, x 3 min 30 sec  Seated hamstring curls 20#, 3 x 10  Seated B knee ext 10# 3 x 10 Standing in ll bars with 2# cuff wts, alt hip abd, alt marching, alt hip and knee ext, 10 reps each Fast walking with HHA x 2 laps In ll bars on airex for heel toe rocks 10 x  In ll bars for step backs , lead foot on airex, 10 x each Nustep level 4 x 6 min  11/02/23 Nustep level 5 x 7.5 minutes Bike level 2 x 6 minutes Leg curls  20# 3x10 Leg extension 10# 2x10 Standing on airex 5# straight arm pulls CGA due to poor balance Calf stretch On airex ball toss CGA for balance Walking ball toss with CGA TUG 13 seconds Direction changes Cone toe touches with two canes and then with one Fast walk 2 laps with HHA    PATIENT EDUCATION:  Education details: POC/HEP  Person educated: Patient Education method: Explanation, Demonstration, Verbal cues, and Handouts Education comprehension: verbalized understanding  HOME EXERCISE PROGRAM: Access Code: [email or web address] URL: [email or web address] Date: 10/12/2023 Prepared by: Stacie Glaze  Exercises - Seated March  - 1 x daily - 7 x weekly - 2 sets - 10 reps - 3 hold - Seated Long Arc Quad  - 1 x daily - 7 x weekly - 2 sets - 10 reps - 3 hold - Seated Toe Raise  - 1 x daily - 7 x weekly - 2 sets - 10 reps - 30 hold - Seated Heel Raise  - 1 x daily - 7 x weekly - 2 sets - 10 reps - 3 hold  ASSESSMENT:  CLINICAL IMPRESSION: Patient is a 78 y.o. female who participated with physical therapy treatment for weakness, leg pain and unsteady gait. I challenged patient to walk outside, she walks at a brisk pace, needed 2 rest breaks going around 1/2 the back parking [place], she needed CGA for curbs.  Reported feeling good after the session   OBJECTIVE IMPAIRMENTS: Abnormal gait, cardiopulmonary status limiting activity, decreased activity tolerance, decreased balance, decreased coordination, decreased endurance, decreased mobility, difficulty walking, decreased ROM, decreased strength, decreased safety awareness, impaired flexibility, and pain.   REHAB POTENTIAL: Good  CLINICAL DECISION MAKING: Stable/uncomplicated  EVALUATION COMPLEXITY: Low   GOALS: Goals reviewed with patient? Yes  SHORT TERM GOALS: Target date: 11/01/23 Independent with initial HEP Baseline: not exercising Goal status:met 10/26/23   LONG TERM GOALS: Target date: 01/10/24  Independent with  advanced HEP Goal status: progressing 12/14/23  2.  Decrease TUG to 13 seconds Baseline:  20 seconds Goal status: progressing 12/07/23, Met10.07s 12/21/23  3.  Decrease 5XSTS to 21 seconds Baseline: 35 seconds with using hand to push up from armrest Goal status: progressing 12/07/23, 21.67s w/o UE push off but uses legs against table 12/21/23  4.  Increase Berg balance test score to 46/56 Baseline: 31/56 Goal status: 34/56 progressing 12/21/23  5.  Walk safely >500 feet with SPC Goal status: progressing 12/07/23, 3 laps ~337ft with SPC before needing a break, able to do 2 more after resting 12/21/23 , as above on 01/04/24 needs rest  PLAN:  PT FREQUENCY: 1-2x/week  PT DURATION: 12 weeks  PLANNED INTERVENTIONS: 97164- PT Re-evaluation, 97110-Therapeutic exercises, 97530- Therapeutic activity, 97112- Neuromuscular re-education, 97535- Self Care, 82956- Manual therapy, [phone]- Gait training, Patient/Family education, Balance training, Stair training, and Vestibular training  PLAN FOR NEXT SESSION: continue with therex to improve righting reactions, balance, activity tolerance. Cone taps, side steps on airex in bars, marching on airex  Jearld Lesch, PT

## 2024-01-11 ENCOUNTER — Other Ambulatory Visit (HOSPITAL_COMMUNITY): Payer: Self-pay

## 2024-01-11 ENCOUNTER — Ambulatory Visit: Attending: Family Medicine

## 2024-01-11 ENCOUNTER — Other Ambulatory Visit: Payer: Self-pay

## 2024-01-11 DIAGNOSIS — R2681 Unsteadiness on feet: Secondary | ICD-10-CM | POA: Insufficient documentation

## 2024-01-11 DIAGNOSIS — M6281 Muscle weakness (generalized): Secondary | ICD-10-CM | POA: Diagnosis not present

## 2024-01-11 DIAGNOSIS — R262 Difficulty in walking, not elsewhere classified: Secondary | ICD-10-CM | POA: Insufficient documentation

## 2024-01-11 NOTE — Therapy (Signed)
 OUTPATIENT PHYSICAL THERAPY LOWER EXTREMITY TREATMENT  Progress Note Reporting Period 10/12/23 to 01/11/24  See note below for Objective Data and Assessment of Progress/Goals.      Patient Name: Cathy Gonzalez MRN: [id number] DOB:1946/03/13, 78 y.o., female Today's Date: 01/11/2024  END OF SESSION:  PT End of Session - 01/11/24 0938     Visit Number 13    Date for PT Re-Evaluation 02/22/24    Authorization Type HTA    Progress Note Due on Visit 20    PT Start Time 0930    PT Stop Time 1013    PT Time Calculation (min) 43 min    Activity Tolerance Patient tolerated treatment well    Behavior During Therapy [facility] for tasks assessed/performed             Past Medical History:  Diagnosis Date   Arthritis    Cataract    left cataract removed in 2017   Diabetes mellitus    Diabetic neuropathy (HCC)    Diabetic neuropathy, type II diabetes mellitus (HCC)    Hepatitis C    took tx for    Hypertension    Hypokalemia    Knee pain    right    Morbidly obese (HCC)    Nocturnal seizures (HCC) 05/14/2015   no bad seizure since 1980's   OSA on CPAP    uses cpap 2-3 week   Plantar fasciitis    both feet   Past Surgical History:  Procedure Laterality Date   ABDOMINAL HYSTERECTOMY     partial   ANTERIOR CERVICAL DECOMP/DISCECTOMY FUSION N/A 02/24/2022   Procedure: CERVICAL FOUR- CERVICAL FIVE ANTERIOR CERVICAL DECOMPRESSION/DISCECTOMY FUSION ONE LEVEL WITH ALLOGRAFT AND PLATE;  Surgeon: Eldred Manges, MD;  [facility]: [facility];  Service: Orthopedics;  Laterality: N/A;   CATARACT EXTRACTION Left    COLONOSCOPY WITH PROPOFOL N/A 08/21/2015   Procedure: COLONOSCOPY WITH PROPOFOL;  Surgeon: [name], MD;  [facility];  Service: Endoscopy;  Laterality: N/A;   COLONOSCOPY WITH PROPOFOL N/A 08/22/2022   Procedure: COLONOSCOPY WITH PROPOFOL;  Surgeon: [name], MD;  [facility];  Service: Gastroenterology;  Laterality: N/A;   ESOPHAGOGASTRODUODENOSCOPY (EGD) WITH  PROPOFOL N/A 08/21/2015   Procedure: ESOPHAGOGASTRODUODENOSCOPY (EGD) WITH PROPOFOL;  Surgeon: [name], MD;  [facility];  Service: Endoscopy;  Laterality: N/A;   ESOPHAGOGASTRODUODENOSCOPY (EGD) WITH PROPOFOL N/A 10/08/2018   Procedure: ESOPHAGOGASTRODUODENOSCOPY (EGD) WITH PROPOFOL;  Surgeon: [name], MD;  [facility];  Service: Endoscopy;  Laterality: N/A;   ESOPHAGOGASTRODUODENOSCOPY (EGD) WITH PROPOFOL N/A 08/22/2022   Procedure: ESOPHAGOGASTRODUODENOSCOPY (EGD) WITH PROPOFOL;  Surgeon: [name], MD;  [facility];  Service: Gastroenterology;  Laterality: N/A;   EYE SURGERY Bilateral    cataracts   POLYPECTOMY  08/22/2022   Procedure: POLYPECTOMY;  Surgeon: [name], MD;  Location: [facility];  Service: Gastroenterology;;   TUBAL LIGATION     Patient Active Problem List   Diagnosis Date Noted   Gouty neuritis (HCC) 03/16/2023   Paroxysmal A-fib (HCC) 03/16/2023   Pain in right hand 10/28/2022   Impingement syndrome of right shoulder 08/12/2022   S/P cervical spinal fusion 06/25/2022   Pain due to onychomycosis of toenails of both feet 05/06/2022   Cervical stenosis of spine 02/24/2022   Neck pain 12/03/2021   Abnormal liver function tests 02/12/2021   Diarrhea 02/12/2021   Gastro-esophageal reflux disease without esophagitis 02/12/2021   Periumbilical pain 02/12/2021   History of colonic polyps 02/12/2021  Portal hypertension (HCC) 02/12/2021   Screening for malignant neoplasm of colon 02/12/2021   Type 2 diabetes mellitus without complications (HCC) 02/12/2021   Primary osteoarthritis, left hand 11/07/2020   Arthritis of carpometacarpal (CMC) joint of thumb 07/18/2020   Bilateral primary osteoarthritis of knee 06/20/2020   Bilateral primary osteoarthritis of hip 06/07/2020   Right thigh pain 05/09/2020   Low back pain 07/14/2019   OSA on CPAP 05/16/2019   Diabetic neuropathy associated with type 2 diabetes mellitus  (HCC) 12/13/2018   Chronic pain of right knee 12/09/2018   Thumb pain, left 12/09/2018   Dizziness and giddiness 01/14/2016   Hepatic cirrhosis (HCC) 07/26/2015   Nocturnal seizures (HCC) 05/14/2015   Chronic hepatitis C without hepatic coma (HCC) 03/14/2015   Vertigo, central 02/11/2014   Difficulty walking 02/11/2014   Diabetes mellitus (HCC) 02/11/2014   Dysphagia 02/11/2014   Class 3 severe obesity with serious comorbidity and body mass index (BMI) of 40.0 to 44.9 in adult, unspecified obesity type (HCC) 07/26/2010   Essential hypertension, benign 07/26/2010   DEGENERATIVE JOINT DISEASE, GENERALIZED 07/26/2010    PCP: Parke Simmers, MD  REFERRING PROVIDER: Parke Simmers, MD  REFERRING DIAG: unsteadiness with walking  THERAPY DIAG:  Difficulty in walking, not elsewhere classified  Muscle weakness (generalized)  Unsteady gait  Rationale for Evaluation and Treatment: Rehabilitation  ONSET DATE: 09/23/23  SUBJECTIVE:   SUBJECTIVE STATEMENT: Im good, I still think I'm better.  I'm having some testing/ procedures done next week for my liver so can't be here then.  [name] PERTINENT HISTORY: See above PAIN:  Are you having pain? Yes: NPRS scale: 0/10 Pain location: feet and right knee Pain description: feet burn from neuropathy, right knee aches Aggravating factors: walking Relieving factors: rest  PRECAUTIONS: Fall  RED FLAGS: None   WEIGHT BEARING RESTRICTIONS: No  FALLS:  Has patient fallen in last 6 months? No and reports that she is just unsteady  LIVING ENVIRONMENT: Lives with: lives with their family Lives in: House/apartment Stairs: No Has following equipment at home: None  OCCUPATION: Cashier 4 hour shifts, reports that she can sit and stand  PLOF: Independent and does her housework, uses an Art gallery manager to do her shopping  PATIENT GOALS: walk better, be steady  NEXT MD VISIT: 10/19/23  OBJECTIVE:  Note: Objective measures were completed at Evaluation unless  otherwise noted.  DIAGNOSTIC FINDINGS: none  COGNITION: Overall cognitive status: Within functional limits for tasks assessed     SENSATION: Can feel the calves, but has minimal sensation in the feet  EDEMA:  Has some compression stocking  MUSCLE LENGTH: Tight mms in the HS and piriformis  POSTURE: rounded shoulders and forward head  PALPATION: Some tenderness in the calves  LOWER EXTREMITY ROM:  WFL's  LOWER EXTREMITY MMT:  MMT Right eval Left eval  Hip flexion 4 4  Hip extension    Hip abduction    Hip adduction    Hip internal rotation    Hip external rotation    Knee flexion 4 4  Knee extension 4- 4-  Ankle dorsiflexion 4 4-  Ankle plantarflexion    Ankle inversion 4- 4-  Ankle eversion 4- 4-   (Blank rows = not tested) FUNCTIONAL TESTS:  5 times sit to stand: 35 seconds had to use arm rests to push up with hands Timed up and go (TUG): 20 seconds, limp on the right, unsteady reached out and touched the walls and the door frames, 11/02/23 = 13 seconds Berg Balance Scale:  31/56  GAIT: Distance walked: 70 feet Assistive device utilized: None Level of assistance: CGA Comments: tends to use walls , unsteady, limps on the right                                                                                                                                TREATMENT DATE:  01/11/24: reassessed Sharlene Motts, 5 x sit to stand, see goals;  Nustep level 5 x 5 min Ue's and LE's Fireman's carry, 3#, 100' each with St cane and SBA In ll bars for alt toe taps to 3" targets( sea urchins)  Standing with B UE support heel/ toe rocks 20x Standing B UE support alt hip abd 10 each Standing B UE support high marches 10 each   01/04/24 Nustep level 5 x 6 minutes Gait outside wth [facility], at times needed CGA especially on slope and with curbs 2 rest breaks Slant board calf stretch HS curls 25# 2x10 Leg extension 5# 2x10 Ball b/n knees squeeze Green tband clamshells 2#  marches  12/28/23 NuStep L5x74mins  Walking on beam Step ups on side steps on airex  On airex marching On airex reaching for numbers  12/21/23 Recheck goals  NuStep L5x49mins  Leg ext 5# 2x10 HS curls 25# 2x10 Calf stretch on black bar 15s x3  Calf raises on bar 2x10  12/14/23 Nustep level 5 x 7 minutes Slant board calf stretches Passive Calf stretches HS curls 25# 3x10 Leg extension 5# 3x10 Seated pball isometric abs On airex 5# straight arm pulls Ball toss and catch HHA side step, fwd and backward walking Cone toe touches with SPC Education on HEP and calf stretches Walking 2 laps 200 feet with SPC and SBA On airex balance beam side stepping  12/07/23 Nustep level 5 x 7 minutes Slant board calf stretches On airex 5# straight arm pulls needed CGA due to poor balance Knee flexion 25# 3x10 Leg extension 5# 3x10 On and off airex ball toss, very scary for her Side stepping on airex balance beam On airex reaching for numbers on wall Walking ball toss 1 lap and needed rest Standing 2.5# marching, hip abduction and extension Walking direction changes with HHA Holding onto [facility] 6" toe touches  11/30/23: Instructed/progressed the pt in the following activities to address her LE strength, cardiovascular endurance, and stability/balance: Recumbent cycle 5 min Nustep 6 min B Knee flexion 35# 2 x 10 B knee ext 10# 3 x 10 Standing in ll bars with 2 # cuff wts ankles, alt SLR, cues to maintain full knee extension throughout 10x Hip ext, alt with cues again for full ext knees 10x Side stepping in ll bars with 2# wts 4 lengths Standing for alt hip abd 2 # wts 10x Standing on airex heel/toe rocks 20x  11/23/23:  Instructed the pt in the following activities to address her LE strength, cardiovascular endurance: Knee flexion B, 20#, 3 x 12 Knee ext B, 10#,  3 x 12 Recumbent cycle level 2.2 5 min Nustep UE's and LE's level 5 x 6 min In ll bars for : heel/toe rocks on airex  20x Standing with 1 1/2# cuff wts on ankles, for alt hip abd, 10 x each Alt standing SLR, 10 x each Alt hip ext with knee extended, 10 x each  11/09/23: Recumbent bike 1.5, x 3 min 30 sec  Seated hamstring curls 20#, 3 x 10  Seated B knee ext 10# 3 x 10 Standing in ll bars with 2# cuff wts, alt hip abd, alt marching, alt hip and knee ext, 10 reps each Fast walking with HHA x 2 laps In ll bars on airex for heel toe rocks 10 x  In ll bars for step backs , lead foot on airex, 10 x each Nustep level 4 x 6 min  11/02/23 Nustep level 5 x 7.5 minutes Bike level 2 x 6 minutes Leg curls 20# 3x10 Leg extension 10# 2x10 Standing on airex 5# straight arm pulls CGA due to poor balance Calf stretch On airex ball toss CGA for balance Walking ball toss with CGA TUG 13 seconds Direction changes Cone toe touches with two canes and then with one Fast walk 2 laps with HHA    PATIENT EDUCATION:  Education details: POC/HEP Person educated: Patient Education method: Programmer, multimedia, Facilities manager, Verbal cues, and Handouts Education comprehension: verbalized understanding  HOME EXERCISE PROGRAM: Access Code: [email or web address] URL: [email or web address] Date: 10/12/2023 Prepared by: Stacie Glaze  Exercises - Seated March  - 1 x daily - 7 x weekly - 2 sets - 10 reps - 3 hold - Seated Long Arc Quad  - 1 x daily - 7 x weekly - 2 sets - 10 reps - 3 hold - Seated Toe Raise  - 1 x daily - 7 x weekly - 2 sets - 10 reps - 30 hold - Seated Heel Raise  - 1 x daily - 7 x weekly - 2 sets - 10 reps - 3 hold  ASSESSMENT:  CLINICAL IMPRESSION: Patient is a 78 y.o. female who participated with physical therapy treatment for weakness, leg pain and unsteady gait. She is at the end of originally projected plan of care date so reassessed her goals and extended her for 6 more weeks , she wishes to continue  a little longer as she is safer, more mobile.  She has improved on her functional tests. Still gets  fatigued easily.  Berg score 38/56. Improved but still fall risk.  She had a new cane today which she is using for community ambulation. Recommend ongoing skilled PT to continue to address her fall risk, balance.  OBJECTIVE IMPAIRMENTS: Abnormal gait, cardiopulmonary status limiting activity, decreased activity tolerance, decreased balance, decreased coordination, decreased endurance, decreased mobility, difficulty walking, decreased ROM, decreased strength, decreased safety awareness, impaired flexibility, and pain.   REHAB POTENTIAL: Good  CLINICAL DECISION MAKING: Stable/uncomplicated  EVALUATION COMPLEXITY: Low   GOALS: Goals reviewed with patient? Yes  SHORT TERM GOALS: Target date: 11/01/23 Independent with initial HEP Baseline: not exercising Goal status:met 10/26/23   LONG TERM GOALS: Target date: 01/10/24, on 01/11/24  extended to 02/22/24, another 6 weeks:  Independent with advanced HEP Goal status: progressing 12/14/23  2.  Decrease TUG to 13 seconds Baseline:  20 seconds Goal status: progressing 12/07/23, Met10.07s 12/21/23  3.  Decrease 5XSTS to 21 seconds Baseline: 35 seconds with using hand to push up from armrest Goal status: progressing 12/07/23, 21.67s w/o UE push off but uses  legs against table 12/21/23 01/11/24: 11.53  4.  Increase Berg balance test score to 46/56 Baseline: 31/56 Goal status: 34/56 progressing 12/21/23 01/11/24: 38/56 5.  Walk safely >500 feet with SPC Goal status: progressing 12/07/23, 3 laps ~315ft with SPC before needing a break, able to do 2 more after resting 12/21/23 , as above on 01/04/24 needs rest 01/11/24:  walked 200' x 2 today,  did not assess 500' , needs assessment again in the next few visits.   PLAN:  PT FREQUENCY: 1-2x/week  PT DURATION: 12 weeks  PLANNED INTERVENTIONS: 97164- PT Re-evaluation, 97110-Therapeutic exercises, 97530- Therapeutic activity, [id number]- Neuromuscular re-education, 97535- Self Care, 16109- Manual therapy, [phone]- Gait  training, Patient/Family education, Balance training, Stair training, and Vestibular training  PLAN FOR NEXT SESSION: continue with therex to improve righting reactions, balance, activity tolerance. Cone taps, side steps on airex in bars, marching on airex  Mikylah Ackroyd L Arther Heisler, PT, DPT, OCS

## 2024-01-12 ENCOUNTER — Other Ambulatory Visit (HOSPITAL_COMMUNITY): Payer: Self-pay

## 2024-01-13 ENCOUNTER — Other Ambulatory Visit (HOSPITAL_COMMUNITY): Payer: Self-pay

## 2024-01-13 DIAGNOSIS — G4733 Obstructive sleep apnea (adult) (pediatric): Secondary | ICD-10-CM | POA: Diagnosis not present

## 2024-01-13 NOTE — Progress Notes (Unsigned)
  Cardiology Office Note:  .   Date:  01/13/2024  ID:  Cathy Gonzalez, DOB May 06, 1946, MRN [id number] PCP: Renaye Rakers, MD  [facility] Providers Cardiologist:  None { Click to update primary MD,subspecialty MD or APP then REFRESH:1}   History of Present Illness: .   No chief complaint on file.   Cathy Gonzalez is a 78 y.o. female with history of PACs and palpitations who presents for follow-up.      Problem List 1. Obesity 2. OSA 3. Diabetes -A1c 6.1 4. HTN 5. HLD -T chol 192, LDL 122, HDL 52, TG 81 -statin intolerant  6. PACs -<1% -EF 60-65%, G1DD    ROS: All other ROS reviewed and negative. Pertinent positives noted in the HPI.     Studies Reviewed: .       NM Stress 05/26/2023   The study is normal. Findings are consistent with no ischemia and no infarction. The study is low risk.   No ST deviation was noted.   LV perfusion is normal. There is no evidence of ischemia. There is no evidence of infarction.   Left ventricular function is normal. Nuclear stress EF: 70%. The left ventricular ejection fraction is hyperdynamic (>65%). End diastolic cavity size is normal. End systolic cavity size is normal.   Prior study not available for comparison.   TTE 10/08/2020  1. Left ventricular ejection fraction, by estimation, is 60 to 65%. The  left ventricle has normal function. The left ventricle has no regional  wall motion abnormalities. Left ventricular diastolic parameters are  consistent with Grade I diastolic  dysfunction (impaired relaxation).   2. Right ventricular systolic function is normal. The right ventricular  size is normal.   3. The mitral valve is normal in structure. No evidence of mitral valve  regurgitation. No evidence of mitral stenosis.   4. The aortic valve is tricuspid. Aortic valve regurgitation is not  visualized. Mild aortic valve sclerosis is present, with no evidence of  aortic valve stenosis.   5. The inferior vena cava is normal in size  with greater than 50%  respiratory variability, suggesting right atrial pressure of 3 mmHg.  Physical Exam:   VS:  There were no vitals taken for this visit.   Wt Readings from Last 3 Encounters:  09/25/23 238 lb (108 kg)  07/08/23 238 lb (108 kg)  05/25/23 236 lb (107 kg)    GEN: Well nourished, well developed in no acute distress NECK: No JVD; No carotid bruits CARDIAC: ***RRR, no murmurs, rubs, gallops RESPIRATORY:  Clear to auscultation without rales, wheezing or rhonchi  ABDOMEN: Soft, non-tender, non-distended EXTREMITIES:  No edema; No deformity  ASSESSMENT AND PLAN: .   ***    {Are you ordering a CV Procedure (e.g. stress test, cath, DCCV, TEE, etc)?   Press F2        :[id number]}   Follow-up: No follow-ups on file.  Time Spent with Patient: I have spent a total of *** minutes caring for this patient today face to face, ordering and reviewing labs/tests, reviewing prior records/medical history, examining the patient, establishing an assessment and plan, communicating results/findings to the patient/family, and documenting in the medical record.   Signed, Lenna Gilford. Flora Lipps, MD, [facility]  [facility]  [address], [address] [place], [place] [address] [phone]  6:48 PM

## 2024-01-14 ENCOUNTER — Encounter: Payer: Self-pay | Admitting: Cardiovascular Disease

## 2024-01-14 ENCOUNTER — Other Ambulatory Visit (HOSPITAL_COMMUNITY): Payer: Self-pay

## 2024-01-14 ENCOUNTER — Ambulatory Visit: Payer: PPO | Attending: Cardiovascular Disease | Admitting: Cardiovascular Disease

## 2024-01-14 VITALS — BP 138/82 | HR 87 | Ht 64.0 in | Wt 240.0 lb

## 2024-01-14 DIAGNOSIS — I150 Renovascular hypertension: Secondary | ICD-10-CM | POA: Diagnosis not present

## 2024-01-14 DIAGNOSIS — I491 Atrial premature depolarization: Secondary | ICD-10-CM | POA: Diagnosis not present

## 2024-01-14 DIAGNOSIS — I5032 Chronic diastolic (congestive) heart failure: Secondary | ICD-10-CM

## 2024-01-14 DIAGNOSIS — R002 Palpitations: Secondary | ICD-10-CM | POA: Diagnosis not present

## 2024-01-14 DIAGNOSIS — E782 Mixed hyperlipidemia: Secondary | ICD-10-CM | POA: Diagnosis not present

## 2024-01-14 MED ORDER — EMPAGLIFLOZIN 25 MG PO TABS
25.0000 mg | ORAL_TABLET | Freq: Every day | ORAL | 1 refills | Status: DC
  Filled 2024-01-14: qty 90, 90d supply, fill #0
  Filled 2024-02-13 – 2024-04-07 (×2): qty 90, 90d supply, fill #1

## 2024-01-14 MED ORDER — SPIRONOLACTONE 25 MG PO TABS
25.0000 mg | ORAL_TABLET | Freq: Every day | ORAL | 3 refills | Status: AC
  Filled 2024-01-14: qty 90, 90d supply, fill #0
  Filled 2024-01-19 – 2024-04-19 (×5): qty 90, 90d supply, fill #1
  Filled 2024-06-30: qty 90, 90d supply, fill #2
  Filled 2024-09-04 – 2024-09-08 (×2): qty 90, 90d supply, fill #3

## 2024-01-14 NOTE — Patient Instructions (Signed)
 Medication Instructions:  - START spironolactone (ALDACTONE) 25 mg by mouth daily    *If you need a refill on your cardiac medications before your next appointment, please call your pharmacy*   Lab Work: None    If you have labs (blood work) drawn today and your tests are completely normal, you will receive your results only by: MyChart Message (if you have MyChart) OR A paper copy in the mail If you have any lab test that is abnormal or we need to change your treatment, we will call you to review the results.   Testing/Procedures: None    Follow-Up: At [facility], you and your health needs are our priority.  As part of our continuing mission to provide you with exceptional heart care, we have created designated Provider Care Teams.  These Care Teams include your primary Cardiologist (physician) and Advanced Practice Providers (APPs -  Physician Assistants and Nurse Practitioners) who all work together to provide you with the care you need, when you need it.  We recommend signing up for the patient portal called "MyChart".  Sign up information is provided on this After Visit Summary.  MyChart is used to connect with patients for Virtual Visits (Telemedicine).  Patients are able to view lab/test results, encounter notes, upcoming appointments, etc.  Non-urgent messages can be sent to your provider as well.   To learn more about what you can do with MyChart, go to [email or web address].    Your next appointment:   1 year(s)  The format for your next appointment:   In Person  Provider:   Lennie Odor, MD     Other Instructions

## 2024-01-16 ENCOUNTER — Other Ambulatory Visit (HOSPITAL_COMMUNITY): Payer: Self-pay

## 2024-01-18 ENCOUNTER — Ambulatory Visit
Admission: RE | Admit: 2024-01-18 | Discharge: 2024-01-18 | Disposition: A | Discharge status: Home / Self Care | Payer: PPO | Source: Ambulatory Visit | Attending: Internal Medicine | Admitting: Internal Medicine

## 2024-01-18 ENCOUNTER — Other Ambulatory Visit: Payer: Self-pay

## 2024-01-18 DIAGNOSIS — K7460 Unspecified cirrhosis of liver: Secondary | ICD-10-CM

## 2024-01-19 ENCOUNTER — Other Ambulatory Visit (HOSPITAL_COMMUNITY): Payer: Self-pay

## 2024-01-22 ENCOUNTER — Other Ambulatory Visit: Payer: Self-pay

## 2024-01-22 ENCOUNTER — Other Ambulatory Visit (HOSPITAL_COMMUNITY): Payer: Self-pay

## 2024-01-25 ENCOUNTER — Other Ambulatory Visit (HOSPITAL_COMMUNITY): Payer: Self-pay

## 2024-01-25 ENCOUNTER — Other Ambulatory Visit: Payer: Self-pay

## 2024-01-26 ENCOUNTER — Other Ambulatory Visit (HOSPITAL_COMMUNITY): Payer: Self-pay

## 2024-01-26 ENCOUNTER — Other Ambulatory Visit: Payer: Self-pay

## 2024-01-29 ENCOUNTER — Telehealth: Payer: Self-pay

## 2024-01-29 ENCOUNTER — Other Ambulatory Visit (HOSPITAL_COMMUNITY): Payer: Self-pay

## 2024-01-29 MED ORDER — EVOLOCUMAB 140 MG/ML ~~LOC~~ SOAJ
140.0000 mg | SUBCUTANEOUS | 2 refills | Status: DC
Start: 2024-01-29 — End: 2024-03-11
  Filled 2024-01-29 – 2024-02-20 (×5): qty 2, 28d supply, fill #0

## 2024-01-29 NOTE — Telephone Encounter (Signed)
 The patient was approved for a [employer] grant that will help cover the cost of Repatha  Total amount awarded, $2,500.  Effective: 02/02/2024 - 01/31/2025   [id number] HQI:[id number] [id number]   Pharmacy provided with approval and processing information. Patient informed via mychart  Albin Huh, CPhT  Pharmacy Patient Advocate Specialist  Direct Number: [phone] Fax: [phone]

## 2024-01-29 NOTE — Progress Notes (Signed)
   01/29/2024  Patient ID: Cathy Gonzalez, female   DOB: [birth year], 78 y.o.   MRN: [id number]  Contacted patient regarding referral for medication access from Jonathon Neighbors, MD . Patient did not have time to discuss medications and disease state management with me. Requested an appointment next week.  Appointment scheduled on 02/01/24 at 2:00pm.   Patient was enrolled in [facility] for Repatha , referral sent for re-enrollment. Collaborated with Trenton Frock, CPhT for re-enrollment in [facility] for Repatha  for 2025-2026. Patient is approved.  Livia Riffle, PharmD Clinical Pharmacist  [phone]

## 2024-01-30 ENCOUNTER — Other Ambulatory Visit (HOSPITAL_COMMUNITY): Payer: Self-pay

## 2024-02-01 ENCOUNTER — Ambulatory Visit (INDEPENDENT_AMBULATORY_CARE_PROVIDER_SITE_OTHER): Payer: PPO | Admitting: Podiatry

## 2024-02-01 DIAGNOSIS — B351 Tinea unguium: Secondary | ICD-10-CM

## 2024-02-01 DIAGNOSIS — M79674 Pain in right toe(s): Secondary | ICD-10-CM | POA: Diagnosis not present

## 2024-02-01 DIAGNOSIS — M79675 Pain in left toe(s): Secondary | ICD-10-CM

## 2024-02-01 NOTE — Progress Notes (Signed)
 Subjective:  Patient ID: Cathy Gonzalez, female    DOB: 1945/11/04,  MRN: [id number]  Cathy Gonzalez presents to clinic today for:  Chief Complaint  Patient presents with   Nail Problem    Pt stated she is here to have her nails trimmed    Patient notes nails are thick, discolored, elongated and painful in shoegear when trying to ambulate.  Patient notes her peripheral neuropathy is worse at bedtime.  She takes Lyrica  300 mg twice daily to help manage this.  PCP is Jonathon Neighbors, MD.  Past Medical History:  Diagnosis Date   Arthritis    Cataract    left cataract removed in 2017   Diabetes mellitus    Diabetic neuropathy (HCC)    Diabetic neuropathy, type II diabetes mellitus (HCC)    Hepatitis C    took tx for    Hypertension    Hypokalemia    Knee pain    right    Morbidly obese (HCC)    Nocturnal seizures (HCC) 05/14/2015   no bad seizure since 1980's   OSA on CPAP    uses cpap 2-3 week   Plantar fasciitis    both feet    Past Surgical History:  Procedure Laterality Date   ABDOMINAL HYSTERECTOMY     partial   ANTERIOR CERVICAL DECOMP/DISCECTOMY FUSION N/A 02/24/2022   Procedure: CERVICAL FOUR- CERVICAL FIVE ANTERIOR CERVICAL DECOMPRESSION/DISCECTOMY FUSION ONE LEVEL WITH ALLOGRAFT AND PLATE;  Surgeon: Adah Acron, MD;  [facility];  Service: Orthopedics;  Laterality: N/A;   CATARACT EXTRACTION Left    COLONOSCOPY WITH PROPOFOL  N/A 08/21/2015   Procedure: COLONOSCOPY WITH PROPOFOL ;  Surgeon: Alvis Jourdain, MD;  [facility];  Service: Endoscopy;  Laterality: N/A;   COLONOSCOPY WITH PROPOFOL  N/A 08/22/2022   Procedure: COLONOSCOPY WITH PROPOFOL ;  Surgeon: Alvis Jourdain, MD;  [facility];  Service: Gastroenterology;  Laterality: N/A;   ESOPHAGOGASTRODUODENOSCOPY (EGD) WITH PROPOFOL  N/A 08/21/2015   Procedure: ESOPHAGOGASTRODUODENOSCOPY (EGD) WITH PROPOFOL ;  Surgeon: Alvis Jourdain, MD;  [facility];  Service: Endoscopy;  Laterality:  N/A;   ESOPHAGOGASTRODUODENOSCOPY (EGD) WITH PROPOFOL  N/A 10/08/2018   Procedure: ESOPHAGOGASTRODUODENOSCOPY (EGD) WITH PROPOFOL ;  Surgeon: Alvis Jourdain, MD;  [facility];  Service: Endoscopy;  Laterality: N/A;   ESOPHAGOGASTRODUODENOSCOPY (EGD) WITH PROPOFOL  N/A 08/22/2022   Procedure: ESOPHAGOGASTRODUODENOSCOPY (EGD) WITH PROPOFOL ;  Surgeon: Alvis Jourdain, MD;  [facility];  Service: Gastroenterology;  Laterality: N/A;   EYE SURGERY Bilateral    cataracts   POLYPECTOMY  08/22/2022   Procedure: POLYPECTOMY;  Surgeon: Alvis Jourdain, MD;  [facility];  Service: Gastroenterology;;   TUBAL LIGATION      Allergies  Allergen Reactions   Acetaminophen      Other reaction(s): GI upset, constipation if taken regularly    Review of Systems: Negative except as noted in the HPI.  Objective:  KRISHMA JANIKOWSKI is a pleasant 78 y.o. female in NAD. AAO x 3.  Vascular Examination: Capillary refill time is 3-5 seconds to toes bilateral. Palpable pedal pulses b/l LE. Digital hair sparse b/l.  Skin temperature gradient WNL b/l. No cyanosis noted b/l.  +1 pitting edema bilateral  Dermatological Examination: Pedal skin with normal turgor, texture and tone b/l. No open wounds. No interdigital macerations b/l. Toenails x10 are 3mm thick, discolored, dystrophic with subungual debris. There is pain with compression of the nail plates.  They are elongated x10  Neurological Examination: Protective sensation intact bilateral LE. Vibratory  sensation diminished bilateral LE.  Assessment/Plan: 1. Pain due to onychomycosis of toenails of both feet    The mycotic toenails were sharply debrided x10 with sterile nail nippers and a power debriding burr to decrease bulk/thickness and length.    Return in about 3 months (around 05/02/2024) for [facility].   Joe Murders, DPM, FACFAS [facility]     [address] [place], [place]  [address]                Office [phone]  Fax [phone]

## 2024-02-02 ENCOUNTER — Other Ambulatory Visit (HOSPITAL_COMMUNITY): Payer: Self-pay

## 2024-02-03 ENCOUNTER — Telehealth: Payer: Self-pay

## 2024-02-03 NOTE — Telephone Encounter (Signed)
 Patient called requesting liver US  results.    Cathy Gonzalez Roann Chestnut, CMA

## 2024-02-03 NOTE — Telephone Encounter (Signed)
 Attempted to contact patient - no answer, left voicemail asking her to return my call.    Cathy Gonzalez, CMA

## 2024-02-03 NOTE — Telephone Encounter (Signed)
 Patient aware of results, will follow up with PCP regarding fatty liver disease.    Rexton Greulich Roann Chestnut, CMA

## 2024-02-09 ENCOUNTER — Other Ambulatory Visit (HOSPITAL_COMMUNITY): Payer: Self-pay

## 2024-02-10 ENCOUNTER — Telehealth: Payer: Self-pay

## 2024-02-10 NOTE — Progress Notes (Signed)
   02/10/2024  Patient ID: Cathy Gonzalez, female   DOB: 09-21-1946, 78 y.o.   MRN: [id number]  Contacted patient regarding referral for diabetes and medication management from Jonathon Neighbors, MD .   Appointment scheduled for 02/11/24 at 11:00 am.  Livia Riffle, PharmD Clinical Pharmacist  [phone]

## 2024-02-11 ENCOUNTER — Other Ambulatory Visit: Payer: Self-pay

## 2024-02-11 ENCOUNTER — Other Ambulatory Visit (HOSPITAL_COMMUNITY): Payer: Self-pay

## 2024-02-11 NOTE — Progress Notes (Signed)
 02/12/2024 Name: Cathy Gonzalez MRN: [id number] DOB: 1946/08/28  Chief Complaint  Patient presents with   Medication Assistance    Jardiance , Novolog     RIKIYAH QUACKENBUSH is a 78 y.o. year old female who presented for a telephone visit for medication assistance and management.      Medication Access/Adherence  Current Pharmacy:  [facility] [address] [place] [address] Phone: [phone] Fax: [phone]   Patient reports affordability concerns with their medications: Yes Patient reports financial barriers to Repatha  and Jardiance . Assisting patient to get re-enrolled in [facility] (Repatha ) and PAP (Jardiance  & Novolog ). Patient reports access/transportation concerns to their pharmacy: No  Patient reports adherence concerns with their medications:  No     Objective:  Medications Reviewed Today     Reviewed by Livia Riffle, [facility] (Pharmacist) on 02/11/24 at 1239  Med List Status: <None>   Medication Order Taking? Sig Documenting Provider Last Dose Status Informant  amLODipine  (NORVASC ) 10 MG tablet [id number] Yes Take 1 tablet (10 mg total) by mouth daily. Gerald Kitty., NP Taking Active   Chlorphen-PE-Acetaminophen  [facility] AD) 4-10-325 MG TABS [id number] Yes Take 1 tablet by mouth 4 (four) times daily.  Taking Active   colchicine  0.6 MG tablet [id number] Yes Take 1 tablet (0.6 mg total) by mouth daily.  Taking Active   empagliflozin  (JARDIANCE ) 25 MG TABS tablet [id number] Yes Take 1 tablet (25 mg total) by mouth daily. Harrold Lincoln, MD Taking Active   Evolocumab  (REPATHA  SURECLICK) 140 MG/ML [place] [id number] Yes Inject 140 mg into the skin every 14 days.  Taking Active            Med Note Livia Riffle D   Thu Feb 11, 2024 12:25 PM) Uses [facility]  ezetimibe  (ZETIA ) 10 MG tablet [id number] Yes Take 1 tablet (10 mg) by mouth at bedtime. Gerald Kitty., NP Taking Active   furosemide  (LASIX ) 40 MG  tablet [id number] Yes Take 1 tablet (40 mg total) by mouth at bedtime. Gerald Kitty., NP Taking Active   glucose blood [facility] VERIO) test strip [id number] Yes Use to test blood glucose 4 times daily.  Taking Active   insulin  aspart (NOVOLOG  FLEXPEN) 100 UNIT/ML FlexPen [id number] Yes Use as directed sliding scale 4-5 units daily subcutaneous if blood sugar over 150. Not to exceed more than 100units daily [provider] Taking Active            Med Note Livia Riffle D   Thu Feb 11, 2024 12:39 PM) Rarely uses. Needs new rx  levETIRAcetam  (KEPPRA ) 1000 MG tablet [id number] Yes Take one (1) and a half (1/2) tablet in the morning and 2 (two) tablets at bedtime Wess Hammed, NP Taking Active   meloxicam  (MOBIC ) 15 MG tablet [id number] No Take 1 tablet (15 mg total) by mouth daily for arthritis  Patient not taking: Reported on 02/11/2024    Not Taking Active   metFORMIN  (GLUCOPHAGE ) 500 MG tablet [id number] Yes Take 1 tablet (500 mg) by mouth every evening as directed  Taking Active   montelukast  (SINGULAIR ) 10 MG tablet [id number] Yes Take 1 tablet (10 mg total) by mouth daily.  Taking Active   [facility] LANCETS 33G MISC [id number] Yes  [provider] Taking Active Self    Discontinued 03/05/23 1204 (Patient Preference)   potassium chloride  SA (KLOR-CON  M) 20 MEQ tablet [id number] Yes Take 1 tablet (20 mEq total) by mouth daily.  Gerald Kitty., NP Taking Active   pregabalin  (LYRICA ) 300 MG capsule [id number] Yes Take 1 capsule (300 mg total) by mouth 2 (two) times daily.  Taking Active   spironolactone  (ALDACTONE ) 25 MG tablet [id number] No Take 1 tablet (25 mg total) by mouth daily.  Patient not taking: Reported on 02/11/2024   Harrold Lincoln, MD Not Taking Active   sucralfate  (CARAFATE ) 1 G tablet [id number] Yes Take 1 g by mouth in the morning, at noon, and at bedtime. [provider] Taking Active Self           Med Note Tobey Forte, Orion Birks D   Thu Feb 11, 2024 12:34 PM) Needs refill  telmisartan  (MICARDIS ) 80 MG tablet [id number] Yes Take 1 tablet (80 mg total) by mouth daily. Gerald Kitty., NP Taking Active   Med List Note Malva Search Dohlen, Michael R, CPhT 02/10/14 2115): Uses cpap machine at 97%             2025 Medication Assistance Renewal Application Summary:  Patient was outreached regarding medication assistance renewal for 2025. Verified address, anticipated insurance for 2025, and income has not changed. Patient remains interested in PAP for 2025 for Jardiance , please see below for other medications identified for medication assistance.    Medication Review Findings:  Novolog  Flexpen - patient using for sliding scale three times daily   Medication Assistance Findings:  Medication assistance needs identified: Jardiance  and Novolog  Flexpen     Additional medication assistance options reviewed with patient as warranted:  [employer] - previously assisted patient in renewal with [employer] for Repatha   Plan: I will route patient assistance letter to pharmacy technician who will coordinate patient assistance program application process for medications listed above.  Pharmacy technician will assist with obtaining all required documents from both patient and provider(s) and submit application(s) once completed.    Thank you for allowing pharmacy to be a part of this patient's care.    Follow Up Plan: Will follow up with patient in 2 wks to discuss PAP and medication management.  Livia Riffle, PharmD Clinical Pharmacist  [phone]

## 2024-02-12 ENCOUNTER — Other Ambulatory Visit (HOSPITAL_COMMUNITY): Payer: Self-pay

## 2024-02-12 ENCOUNTER — Telehealth: Payer: Self-pay

## 2024-02-12 NOTE — Telephone Encounter (Signed)
 Pharmacy Patient Advocate Encounter  Insurance verification completed.   The patient is insured through [name] ADVANTAGE/RX ADVANCE   Ran test claim for Jardiance . Currently a quantity of 90 is a 90 day supply and the co-pay is $0 .   This test claim was processed through [facility]- copay amounts may vary at other pharmacies due to pharmacy/plan contracts, or as the patient moves through the different stages of their insurance plan.   Pharmacy Patient Advocate Encounter  Insurance verification completed.   The patient is insured through [facility] ADVANTAGE/RX ADVANCE   Ran test claim for Novolg. Currently a quantity of 30 is a 30 day supply and the co-pay is $0 .   This test claim was processed through [facility]- copay amounts may vary at other pharmacies due to pharmacy/plan contracts, or as the patient moves through the different stages of their insurance plan.

## 2024-02-13 ENCOUNTER — Other Ambulatory Visit (HOSPITAL_COMMUNITY): Payer: Self-pay

## 2024-02-13 ENCOUNTER — Other Ambulatory Visit: Payer: Self-pay | Admitting: Nurse Practitioner

## 2024-02-14 ENCOUNTER — Other Ambulatory Visit (HOSPITAL_COMMUNITY): Payer: Self-pay

## 2024-02-14 MED ORDER — MONTELUKAST SODIUM 10 MG PO TABS
10.0000 mg | ORAL_TABLET | Freq: Every day | ORAL | 1 refills | Status: DC
  Filled 2024-02-14: qty 90, 90d supply, fill #0
  Filled 2024-04-19 – 2024-05-29 (×2): qty 90, 90d supply, fill #1

## 2024-02-14 MED ORDER — COLCHICINE 0.6 MG PO TABS
0.6000 mg | ORAL_TABLET | Freq: Every day | ORAL | 1 refills | Status: DC
  Filled 2024-02-14 – 2024-03-30 (×2): qty 90, 90d supply, fill #0
  Filled 2024-06-30: qty 90, 90d supply, fill #1

## 2024-02-15 ENCOUNTER — Other Ambulatory Visit (HOSPITAL_COMMUNITY): Payer: Self-pay

## 2024-02-15 ENCOUNTER — Other Ambulatory Visit: Payer: Self-pay

## 2024-02-15 MED ORDER — AMLODIPINE BESYLATE 10 MG PO TABS
10.0000 mg | ORAL_TABLET | Freq: Every day | ORAL | 3 refills | Status: AC
  Filled 2024-02-15 – 2024-04-19 (×4): qty 90, 90d supply, fill #0
  Filled 2024-07-18: qty 90, 90d supply, fill #1
  Filled 2024-09-04 – 2024-10-04 (×3): qty 90, 90d supply, fill #2

## 2024-02-15 MED ORDER — EZETIMIBE 10 MG PO TABS
10.0000 mg | ORAL_TABLET | Freq: Every day | ORAL | 3 refills | Status: AC
  Filled 2024-02-15 – 2024-05-05 (×3): qty 90, 90d supply, fill #0
  Filled 2024-06-30 – 2024-07-18 (×2): qty 90, 90d supply, fill #1
  Filled 2024-10-04: qty 90, 90d supply, fill #2

## 2024-02-15 MED ORDER — TELMISARTAN 80 MG PO TABS
80.0000 mg | ORAL_TABLET | Freq: Every day | ORAL | 3 refills | Status: AC
  Filled 2024-02-15 – 2024-03-30 (×2): qty 90, 90d supply, fill #0
  Filled 2024-06-30: qty 90, 90d supply, fill #1
  Filled 2024-07-18 – 2024-09-08 (×4): qty 90, 90d supply, fill #2

## 2024-02-15 MED ORDER — FUROSEMIDE 40 MG PO TABS
40.0000 mg | ORAL_TABLET | Freq: Every day | ORAL | 3 refills | Status: DC
  Filled 2024-02-15 – 2024-03-30 (×2): qty 90, 90d supply, fill #0

## 2024-02-18 NOTE — Progress Notes (Signed)
 02/18/2024 Name: Cathy Gonzalez MRN: [id number] DOB: 11/15/1945    Contacted patient to update her on full coverage for diabetes medications, Jardiance  and Novolog , through her [employer] plan. The patient verbalized understanding. No other medication concerns at this time.   Thank you for allowing pharmacy to be a part of this patient's care.   Livia Riffle, PharmD Clinical Pharmacist  [phone]

## 2024-02-20 ENCOUNTER — Other Ambulatory Visit (HOSPITAL_COMMUNITY): Payer: Self-pay

## 2024-02-22 ENCOUNTER — Other Ambulatory Visit (HOSPITAL_COMMUNITY): Payer: Self-pay

## 2024-02-22 DIAGNOSIS — Z9989 Dependence on other enabling machines and devices: Secondary | ICD-10-CM | POA: Diagnosis not present

## 2024-02-22 DIAGNOSIS — E0800 Diabetes mellitus due to underlying condition with hyperosmolarity without nonketotic hyperglycemic-hyperosmolar coma (NKHHC): Secondary | ICD-10-CM | POA: Diagnosis not present

## 2024-02-22 DIAGNOSIS — G40001 Localization-related (focal) (partial) idiopathic epilepsy and epileptic syndromes with seizures of localized onset, not intractable, with status epilepticus: Secondary | ICD-10-CM | POA: Diagnosis not present

## 2024-02-22 DIAGNOSIS — G4730 Sleep apnea, unspecified: Secondary | ICD-10-CM | POA: Diagnosis not present

## 2024-02-22 DIAGNOSIS — E6609 Other obesity due to excess calories: Secondary | ICD-10-CM | POA: Diagnosis not present

## 2024-02-22 DIAGNOSIS — I10 Essential (primary) hypertension: Secondary | ICD-10-CM | POA: Diagnosis not present

## 2024-02-25 ENCOUNTER — Other Ambulatory Visit (HOSPITAL_COMMUNITY): Payer: Self-pay

## 2024-02-26 ENCOUNTER — Other Ambulatory Visit (HOSPITAL_COMMUNITY): Payer: Self-pay

## 2024-03-06 ENCOUNTER — Encounter: Payer: Self-pay | Admitting: Infectious Disease

## 2024-03-06 DIAGNOSIS — K7581 Nonalcoholic steatohepatitis (NASH): Secondary | ICD-10-CM | POA: Insufficient documentation

## 2024-03-06 DIAGNOSIS — B1920 Unspecified viral hepatitis C without hepatic coma: Secondary | ICD-10-CM

## 2024-03-06 HISTORY — DX: Nonalcoholic steatohepatitis (NASH): K75.81

## 2024-03-06 HISTORY — DX: Other cirrhosis of liver: B19.20

## 2024-03-07 ENCOUNTER — Other Ambulatory Visit (HOSPITAL_COMMUNITY): Payer: Self-pay

## 2024-03-07 ENCOUNTER — Encounter: Payer: Self-pay | Admitting: Infectious Disease

## 2024-03-07 ENCOUNTER — Other Ambulatory Visit: Payer: Self-pay

## 2024-03-07 ENCOUNTER — Ambulatory Visit: Admitting: Infectious Disease

## 2024-03-07 VITALS — BP 143/85 | HR 82 | Resp 16 | Ht 64.0 in | Wt 241.5 lb

## 2024-03-07 DIAGNOSIS — K7581 Nonalcoholic steatohepatitis (NASH): Secondary | ICD-10-CM

## 2024-03-07 DIAGNOSIS — B182 Chronic viral hepatitis C: Secondary | ICD-10-CM | POA: Diagnosis not present

## 2024-03-07 DIAGNOSIS — K7469 Other cirrhosis of liver: Secondary | ICD-10-CM

## 2024-03-07 DIAGNOSIS — B1920 Unspecified viral hepatitis C without hepatic coma: Secondary | ICD-10-CM | POA: Diagnosis not present

## 2024-03-07 DIAGNOSIS — M545 Low back pain, unspecified: Secondary | ICD-10-CM | POA: Diagnosis not present

## 2024-03-07 NOTE — Progress Notes (Signed)
 Subjective:  Chief complaint: followup for cirrhosis   Patient ID: Cathy Gonzalez, female    DOB: 06-27-1946, 78 y.o.   MRN: [id number]  HPI  Discussed the use of AI scribe software for clinical note transcription with the patient, who gave verbal consent to proceed.  History of Present Illness   Cathy Gonzalez is a 78 year old female with compensated cirrhosis due to Hepatitis C (cured) and NASH who presents for routine follow-up and liver screening.  She has a history of hepatitis C, which has been cured. She undergoes routine follow-up for liver health, including annual ultrasounds to screen for liver cancer due to cirrhosis and liver scarring. An EGD, peformed by Dr. Nickey Barn last year found  no esophageal varices. She is currently taking Repatha  for cholesterol management. Her diabetes is managed with an HbA1c level of 6.7. No current alcohol use.      Past Medical History:  Diagnosis Date   Arthritis    Cataract    left cataract removed in 2017   Compensated cirrhosis related to hepatitis C virus (HCV) (HCC) 03/06/2024   Diabetes mellitus    Diabetic neuropathy (HCC)    Diabetic neuropathy, type II diabetes mellitus (HCC)    Hepatitis C    took tx for    Hypertension    Hypokalemia    Knee pain    right    Morbidly obese (HCC)    NASH (nonalcoholic steatohepatitis) 03/06/2024   Nocturnal seizures (HCC) 05/14/2015   no bad seizure since 1980's   OSA on CPAP    uses cpap 2-3 week   Plantar fasciitis    both feet    Past Surgical History:  Procedure Laterality Date   ABDOMINAL HYSTERECTOMY     partial   ANTERIOR CERVICAL DECOMP/DISCECTOMY FUSION N/A 02/24/2022   Procedure: CERVICAL FOUR- CERVICAL FIVE ANTERIOR CERVICAL DECOMPRESSION/DISCECTOMY FUSION ONE LEVEL WITH ALLOGRAFT AND PLATE;  Surgeon: Adah Acron, MD;  Location: [facility] OR;  Service: Orthopedics;  Laterality: N/A;   CATARACT EXTRACTION Left    COLONOSCOPY WITH PROPOFOL  N/A 08/21/2015   Procedure: COLONOSCOPY WITH  PROPOFOL ;  Surgeon: Alvis Jourdain, MD;  Location: [facility];  Service: Endoscopy;  Laterality: N/A;   COLONOSCOPY WITH PROPOFOL  N/A 08/22/2022   Procedure: COLONOSCOPY WITH PROPOFOL ;  Surgeon: Alvis Jourdain, MD;  Location: [facility];  Service: Gastroenterology;  Laterality: N/A;   ESOPHAGOGASTRODUODENOSCOPY (EGD) WITH PROPOFOL  N/A 08/21/2015   Procedure: ESOPHAGOGASTRODUODENOSCOPY (EGD) WITH PROPOFOL ;  Surgeon: Alvis Jourdain, MD;  Location: [facility];  Service: Endoscopy;  Laterality: N/A;   ESOPHAGOGASTRODUODENOSCOPY (EGD) WITH PROPOFOL  N/A 10/08/2018   Procedure: ESOPHAGOGASTRODUODENOSCOPY (EGD) WITH PROPOFOL ;  Surgeon: Alvis Jourdain, MD;  Location: [facility];  Service: Endoscopy;  Laterality: N/A;   ESOPHAGOGASTRODUODENOSCOPY (EGD) WITH PROPOFOL  N/A 08/22/2022   Procedure: ESOPHAGOGASTRODUODENOSCOPY (EGD) WITH PROPOFOL ;  Surgeon: Alvis Jourdain, MD;  Location: [facility];  Service: Gastroenterology;  Laterality: N/A;   EYE SURGERY Bilateral    cataracts   POLYPECTOMY  08/22/2022   Procedure: POLYPECTOMY;  Surgeon: Alvis Jourdain, MD;  Location: [facility];  Service: Gastroenterology;;   TUBAL LIGATION      Family History  Problem Relation Age of Onset   Diabetes Mother    Heart attack Mother    Diabetes Sister    Diabetes Brother    Cancer Sister        breast   Breast cancer Sister    Seizures Neg Hx       Social History   Socioeconomic  History   Marital status: Single    Spouse name: Not on file   Number of children: 4   Years of education: 70   Highest education level: Not on file  Occupational History   Occupation: [employer]  Tobacco Use   Smoking status: Never   Smokeless tobacco: Never  Vaping Use   Vaping status: Never Used  Substance and Sexual Activity   Alcohol use: No    Alcohol/week: 0.0 standard drinks of alcohol   Drug use: No   Sexual activity: Never  Other Topics Concern   Not on file  Social History Narrative   Patient occasionally  drinks caffeine.   Patient is right handed.   Social Drivers of Corporate investment banker Strain: Not on file  Food Insecurity: Food Insecurity Present (04/10/2020)   Hunger Vital Sign    Worried About Running Out of Food in the Last Year: Sometimes true    Ran Out of Food in the Last Year: Never true  Transportation Needs: No Transportation Needs (04/10/2020)   PRAPARE - Administrator, Civil Service (Medical): No    Lack of Transportation (Non-Medical): No  Physical Activity: Not on file  Stress: Not on file  Social Connections: Not on file    Allergies  Allergen Reactions   Acetaminophen      Other reaction(s): GI upset, constipation if taken regularly     Current Outpatient Medications:    amLODipine  (NORVASC ) 10 MG tablet, Take 1 tablet (10 mg total) by mouth daily., Disp: 90 tablet, Rfl: 3   Chlorphen-PE-Acetaminophen  (NOREL AD) 4-10-325 MG TABS, Take 1 tablet by mouth 4 (four) times daily., Disp: 20 tablet, Rfl: 4   colchicine  0.6 MG tablet, Take 1 tablet (0.6 mg total) by mouth daily., Disp: 90 tablet, Rfl: 1   empagliflozin  (JARDIANCE ) 25 MG TABS tablet, Take 1 tablet (25 mg total) by mouth daily., Disp: 90 tablet, Rfl: 1   Evolocumab  (REPATHA  SURECLICK) 140 MG/ML SOAJ, Inject 140 mg into the skin every 14 days., Disp: 2 mL, Rfl: 2   ezetimibe  (ZETIA ) 10 MG tablet, Take 1 tablet (10 mg) by mouth at bedtime., Disp: 90 tablet, Rfl: 3   furosemide  (LASIX ) 40 MG tablet, Take 1 tablet (40 mg total) by mouth at bedtime., Disp: 90 tablet, Rfl: 3   glucose blood (ONETOUCH VERIO) test strip, Use to test blood glucose 4 times daily., Disp: 100 each, Rfl: 6   insulin  aspart (NOVOLOG  FLEXPEN) 100 UNIT/ML FlexPen, Use as directed sliding scale 4-5 units daily subcutaneous if blood sugar over 150. Not to exceed more than 100units daily, Disp: , Rfl:    levETIRAcetam  (KEPPRA ) 1000 MG tablet, Take one (1) and a half (1/2) tablet in the morning and 2 (two) tablets at bedtime,  Disp: 320 tablet, Rfl: 3   metFORMIN  (GLUCOPHAGE ) 500 MG tablet, Take 1 tablet (500 mg) by mouth every evening as directed, Disp: 90 tablet, Rfl: 1   montelukast  (SINGULAIR ) 10 MG tablet, Take 1 tablet (10 mg total) by mouth daily., Disp: 90 tablet, Rfl: 1   ONETOUCH DELICA LANCETS 33G MISC, , Disp: , Rfl: 3   potassium chloride  SA (KLOR-CON  M) 20 MEQ tablet, Take 1 tablet (20 mEq total) by mouth daily., Disp: 90 tablet, Rfl: 3   pregabalin  (LYRICA ) 300 MG capsule, Take 1 capsule (300 mg total) by mouth 2 (two) times daily., Disp: 180 capsule, Rfl: 1   sucralfate  (CARAFATE ) 1 G tablet, Take 1 g by mouth in  the morning, at noon, and at bedtime., Disp: , Rfl:    telmisartan  (MICARDIS ) 80 MG tablet, Take 1 tablet (80 mg total) by mouth daily., Disp: 90 tablet, Rfl: 3   meloxicam  (MOBIC ) 15 MG tablet, Take 1 tablet (15 mg total) by mouth daily for arthritis (Patient not taking: Reported on 03/07/2024), Disp: 30 tablet, Rfl: 0   spironolactone  (ALDACTONE ) 25 MG tablet, Take 1 tablet (25 mg total) by mouth daily. (Patient not taking: Reported on 03/07/2024), Disp: 90 tablet, Rfl: 3   Review of Systems  Constitutional:  Negative for activity change, appetite change, chills, diaphoresis, fatigue, fever and unexpected weight change.  HENT:  Negative for congestion, rhinorrhea, sinus pressure, sneezing, sore throat and trouble swallowing.   Eyes:  Negative for photophobia and visual disturbance.  Respiratory:  Negative for cough, chest tightness, shortness of breath, wheezing and stridor.   Cardiovascular:  Negative for chest pain, palpitations and leg swelling.  Gastrointestinal:  Negative for abdominal distention, abdominal pain, anal bleeding, blood in stool, constipation, diarrhea, nausea and vomiting.  Genitourinary:  Negative for difficulty urinating, dysuria, flank pain and hematuria.  Musculoskeletal:  Negative for arthralgias, back pain, gait problem, joint swelling and myalgias.  Skin:  Negative for  color change, pallor, rash and wound.  Neurological:  Negative for dizziness, tremors, weakness and light-headedness.  Hematological:  Negative for adenopathy. Does not bruise/bleed easily.  Psychiatric/Behavioral:  Negative for agitation, behavioral problems, confusion, decreased concentration, dysphoric mood and sleep disturbance.        Objective:   Physical Exam Constitutional:      General: She is not in acute distress.    Appearance: Normal appearance. She is well-developed. She is not ill-appearing or diaphoretic.  HENT:     Head: Normocephalic and atraumatic.     Right Ear: Hearing and external ear normal.     Left Ear: Hearing and external ear normal.     Nose: No nasal deformity or rhinorrhea.  Eyes:     General: No scleral icterus.    Conjunctiva/sclera: Conjunctivae normal.     Right eye: Right conjunctiva is not injected.     Left eye: Left conjunctiva is not injected.     Pupils: Pupils are equal, round, and reactive to light.  Neck:     Vascular: No JVD.  Cardiovascular:     Rate and Rhythm: Normal rate and regular rhythm.     Heart sounds: S1 normal and S2 normal.  Pulmonary:     Effort: Pulmonary effort is normal. No respiratory distress.     Breath sounds: No wheezing.  Abdominal:     General: Bowel sounds are normal. There is no distension.     Palpations: Abdomen is soft.     Tenderness: There is no abdominal tenderness.  Musculoskeletal:        General: Normal range of motion.     Right shoulder: Normal.     Left shoulder: Normal.     Cervical back: Normal range of motion and neck supple.     Right hip: Normal.     Left hip: Normal.     Right knee: Normal.     Left knee: Normal.  Lymphadenopathy:     Head:     Right side of head: No submandibular, preauricular or posterior auricular adenopathy.     Left side of head: No submandibular, preauricular or posterior auricular adenopathy.     Cervical: No cervical adenopathy.     Right cervical: No  superficial or  deep cervical adenopathy.    Left cervical: No superficial or deep cervical adenopathy.  Skin:    General: Skin is warm and dry.     Coloration: Skin is not pale.     Findings: No abrasion, bruising, ecchymosis, erythema, lesion or rash.     Nails: There is no clubbing.  Neurological:     Mental Status: She is alert and oriented to person, place, and time.     Sensory: No sensory deficit.     Coordination: Coordination normal.     Gait: Gait normal.  Psychiatric:        Attention and Perception: She is attentive.        Speech: Speech normal.        Behavior: Behavior normal. Behavior is cooperative.        Thought Content: Thought content normal.        Judgment: Judgment normal.           Assessment & Plan:   Assessment and Plan    Cirrhosis of liver due the Hepatitis C and NASH. NO etoh consumption Cirrhosis with liver scarring. Recent ultrasound showed no hepatocellular carcinoma. Under gastroenterology care for monitoring. - Continue annual liver ultrasound screenings for hepatocellular carcinoma surveillance. --ordered AFP today with CMP and reviewed her recent  US  and ordered one for 6 months time  Fatty liver disease Fatty liver disease identified on ultrasound. Emphasized weight loss importance. . Discussed potential benefits of Ozempic or Mounjaro.   Advised consultation with primary care for weight loss medications. - Consult primary care physician about potential weight loss medications for fatty liver disease management.  Back pain Chronic back pain managed with cortisone injections. Reports instability and low back pain despite rehabilitation exercises. - Continue cortisone injections for back pain management.

## 2024-03-08 ENCOUNTER — Other Ambulatory Visit: Payer: Self-pay | Admitting: Infectious Disease

## 2024-03-08 DIAGNOSIS — B1920 Unspecified viral hepatitis C without hepatic coma: Secondary | ICD-10-CM

## 2024-03-08 DIAGNOSIS — R772 Abnormality of alphafetoprotein: Secondary | ICD-10-CM

## 2024-03-08 LAB — COMPLETE METABOLIC PANEL WITHOUT GFR
AG Ratio: 1.8 (calc) (ref 1.0–2.5)
ALT: 58 U/L — ABNORMAL HIGH (ref 6–29)
AST: 45 U/L — ABNORMAL HIGH (ref 10–35)
Albumin: 4.6 g/dL (ref 3.6–5.1)
Alkaline phosphatase (APISO): 87 U/L (ref 37–153)
BUN/Creatinine Ratio: 16 (calc) (ref 6–22)
BUN: 18 mg/dL (ref 7–25)
CO2: 30 mmol/L (ref 20–32)
Calcium: 10.2 mg/dL (ref 8.6–10.4)
Chloride: 104 mmol/L (ref 98–110)
Creat: 1.11 mg/dL — ABNORMAL HIGH (ref 0.60–1.00)
Globulin: 2.5 g/dL (ref 1.9–3.7)
Glucose, Bld: 120 mg/dL — ABNORMAL HIGH (ref 65–99)
Potassium: 3.7 mmol/L (ref 3.5–5.3)
Sodium: 144 mmol/L (ref 135–146)
Total Bilirubin: 0.3 mg/dL (ref 0.2–1.2)
Total Protein: 7.1 g/dL (ref 6.1–8.1)

## 2024-03-08 LAB — AFP TUMOR MARKER: AFP-Tumor Marker: 20.4 ng/mL — ABNORMAL HIGH

## 2024-03-09 ENCOUNTER — Ambulatory Visit: Payer: Self-pay

## 2024-03-09 ENCOUNTER — Other Ambulatory Visit (HOSPITAL_COMMUNITY): Payer: Self-pay

## 2024-03-09 NOTE — Telephone Encounter (Signed)
-----   Message from [place] sent at 03/08/2024 11:22 PM EDT ----- While her RUQ US  was fine her AFP is quite high I think we need an MRI of the liver with and without contrast. I will order it ----- Message ----- From: Interface, [facility] Results In Sent: 03/08/2024  12:11 AM EDT To: Charolette Copier, MD

## 2024-03-09 NOTE — Telephone Encounter (Signed)
 Left voicemail requesting patient call office back regarding results. Will forward message to referral coordinator to schedule MRI. Julien Odor, RMA

## 2024-03-10 ENCOUNTER — Other Ambulatory Visit (HOSPITAL_COMMUNITY): Payer: Self-pay

## 2024-03-11 ENCOUNTER — Other Ambulatory Visit (HOSPITAL_BASED_OUTPATIENT_CLINIC_OR_DEPARTMENT_OTHER): Payer: Self-pay

## 2024-03-11 ENCOUNTER — Telehealth: Payer: Self-pay | Admitting: Pharmacist

## 2024-03-11 ENCOUNTER — Other Ambulatory Visit (HOSPITAL_COMMUNITY): Payer: Self-pay

## 2024-03-11 ENCOUNTER — Telehealth: Payer: Self-pay

## 2024-03-11 DIAGNOSIS — E782 Mixed hyperlipidemia: Secondary | ICD-10-CM

## 2024-03-11 MED ORDER — REPATHA SURECLICK 140 MG/ML ~~LOC~~ SOAJ
1.0000 mL | SUBCUTANEOUS | 1 refills | Status: DC
Start: 2024-03-11 — End: 2024-08-25
  Filled 2024-03-11: qty 6, 84d supply, fill #0
  Filled 2024-06-30: qty 6, 84d supply, fill #1

## 2024-03-11 NOTE — Telephone Encounter (Signed)
 Rerouting Repatha  Rx to [employer]

## 2024-03-11 NOTE — Telephone Encounter (Signed)
 Pharmacy Patient Advocate Encounter   Received notification from Patient Pharmacy that prior authorization for REPATHA  is required/requested.   Insurance verification completed.   The patient is insured through [facility] ADVANTAGE/RX ADVANCE .   Per test claim: PA required; PA submitted to above mentioned insurance via CoverMyMeds Key/confirmation #/EOC [id number] Status is pending

## 2024-03-11 NOTE — Telephone Encounter (Signed)
 Pharmacy Patient Advocate Encounter  Received notification from [facility] ADVANTAGE/RX ADVANCE that Prior Authorization for REPATHA  has been APPROVED from 03/11/24 to 03/11/25

## 2024-03-15 ENCOUNTER — Other Ambulatory Visit (HOSPITAL_COMMUNITY): Payer: Self-pay

## 2024-03-15 ENCOUNTER — Ambulatory Visit: Payer: PPO | Admitting: Neurology

## 2024-03-15 ENCOUNTER — Telehealth: Payer: Self-pay

## 2024-03-15 ENCOUNTER — Encounter: Payer: Self-pay | Admitting: Neurology

## 2024-03-15 VITALS — BP 112/67 | HR 90 | Resp 17 | Ht 64.0 in | Wt 244.2 lb

## 2024-03-15 DIAGNOSIS — R569 Unspecified convulsions: Secondary | ICD-10-CM

## 2024-03-15 DIAGNOSIS — G4733 Obstructive sleep apnea (adult) (pediatric): Secondary | ICD-10-CM | POA: Diagnosis not present

## 2024-03-15 MED ORDER — LEVETIRACETAM 1000 MG PO TABS
1000.0000 mg | ORAL_TABLET | ORAL | 3 refills | Status: AC
  Filled 2024-03-15: qty 320, 91d supply, fill #0
  Filled 2024-04-25: qty 320, 90d supply, fill #0
  Filled 2024-06-14 – 2024-07-02 (×3): qty 320, 90d supply, fill #1
  Filled 2024-09-04 – 2024-09-08 (×2): qty 320, 90d supply, fill #2

## 2024-03-15 NOTE — Telephone Encounter (Signed)
 Outgoing community msg to [employer]  [phone]

## 2024-03-15 NOTE — Progress Notes (Signed)
 PATIENT: Cathy Gonzalez DOB: [birth year]  REASON FOR VISIT: follow up HISTORY FROM: patient PRIMARY NEUROLOGIST: Dr. Albertina Hugger  ASSESSMENT AND PLAN 78 y.o. year old female  has a past medical history of Arthritis, Cataract, Compensated cirrhosis related to hepatitis C virus (HCV) (HCC) (03/06/2024), Diabetes mellitus, Diabetic neuropathy (HCC), Diabetic neuropathy, type II diabetes mellitus (HCC), Hepatitis C, Hypertension, Hypokalemia, Knee pain, Morbidly obese (HCC), NASH (nonalcoholic steatohepatitis) (03/06/2024), Nocturnal seizures (HCC) (05/14/2015), OSA on CPAP, and Plantar fasciitis. here with:  1.  Nocturnal seizures 2.  Sleep apnea, on CPAP 3.  Diabetic peripheral neuropathy 4.  History of cervical spine fusion  - Due for a new CPAP machine, will send orders for a new CPAP machine to DME.  Current CPAP machine was set up in June 2020  - HST January 2024 showed severe sleep apnea - Continue Keppra  1500/2000 mg daily for seizure prevention  - Check Keppra  level for baseline  - EEG March 2024 was normal  - If second seizure medication is needed we will have to consider chronic cirrhosis. She was previously treated with carbamazepine  but had to stop in 2016 and switch to Keppra  while treated for Hepatitis C - Call for seizure activity - Follow up in 1 year, 30-90 days after starting new CPAP machine  Orders Placed This Encounter  Procedures   For home use only DME continuous positive airway pressure (CPAP)   Levetiracetam  level   HISTORY OF PRESENT ILLNESS: Today 03/15/24  EEG March 2024 was normal. CPAP setup June 2020.  HST January 2024 showed severe sleep apnea. Hasn't used CPAP in 2 months. Reports she had 2 sinus infections. She stopped using it. Would like to see if eligible for a new machine. Has been using [facility].  Has been feeling very tired during the day, sleeps all the time. 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 Remains on Keppra  1500/2000 mg daily. Doesn't miss any  doses. Few months ago she may have had a light seizure, known of chewing on her tongue, caused by stress. She lives alone. Cirrhosis is stable.  ESS 7.  Update 09/17/22 SS: Cathy Gonzalez here today for follow-up due to recurrent seizure. She was using CPAP, after about 3 weeks her face mask doesn't get a new seal, she was not wearing CPAP that night. She woke up the next morning, had bitten her left tongue, she felt tired, head was bloated. 2 weeks prior had started Repatha , that day had sinus issues, took Muccinex that night. Has remained on Keppra  1500 mg twice a day.  In 2016 she was previously treated for nocturnal seizures with carbamazepine .  She was switched to Keppra  due to treatment for hepatitis C.  Has had nocturnal seizures since 1970's. Has cirrhosis. Would like a new CPAP machine, claims falling apart. Would like MRI of the brain, given chronic gait instability more than she thinks normal for age, fullness in her head (could be sinuses), feels something isn't right. Most seizures triggered by stress, nothing stressful right now. What caused it?  Review of CPAP data 08/18/2022-09/16/2022 23/30 days 77% > 4 hours 19 days 63%, leak 11.3, AHI 1.4  05/01/22 SS: Cathy Gonzalez is here today for follow-up.  Is on Keppra  for seizures. On 02/24/22 had cervical fusion with Dr. Murrel Arnt, it went well.  Is on CPAP.  She could not use CPAP due to wearing a soft neck brace for period of time. Denies any recent seizure. On Monday, she took her CPAP in  for service. Is ready to get back to using.  Continues to work part-time as a Conservation officer, nature at [employer].  No falls.  Update 01/07/2021 SS: Ms. Rollyson is a 78 year old female with history of obesity, diabetes, diabetic peripheral neuropathy, and nocturnal seizures.  She is on CPAP. She is on Keppra  for seizures, also Lyrica  for neuropathy filled by PCP.  She is doing overall well, may have had a small seizure 1-2 years ago, but isn't sure on timeframe.  Seizures are always  nocturnal, will wake up the next morning bit her tongue, feel drowsy the next day.  Are often brought on by stress.  She lives alone, drives a car.  Has been prescribed lorazepam to take as needed for panic attacks.  She has a lot of stress with her grown children.  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  Claims mostly compliant with CPAP, only misses if she doesn't have masks.  Was dealing with a poorly healing wound to her left lower leg, is now resolved.  Last A1c was around 6.5.  Here today for evaluation unaccompanied.  Update 01/02/2020 SS: Ms. Thielke is a 78 year old female with history of obesity, diabetes, diabetic peripheral neuropathy, and nocturnal seizures.  She is on CPAP.  She has had 1 brief nocturnal seizure in December 2019, associated with significant stress.  She remains on Keppra  and Lyrica .  She has not had recurrent seizure.  Her neuropathy is doing better.  Her diabetes is under good control, recent A1c was 5.6.  She has been able to lose 30 pounds.  She says her gait and balance is more stable.  She has not had any falls.  She continues to use CPAP, but is having issues with the tubing leaking, despite getting new tubing.  She presents today for evaluation accompanied by her great-grandson.  She denies any new problems or concerns.  HISTORY 12/13/2018 Dr. Tilda Fogo: Ms. Pond is a 78 year old right-handed black female with a history of obesity, diabetes, diabetic peripheral neuropathy, and nocturnal seizures.  The patient also indicates that she has sleep apnea, she has a CPAP machine but she does not tolerate the mask.  The patient indicates that when she tries to use a humidifier with the mask she has problems with the water getting in her throat, when she does not use a humidifier, her mouth dries out.  She wishes to have reevaluation of this issue.  The patient has had 1 brief nocturnal seizure in December 2019 associated with the time of stress when her sister and her  sister's son died.  The patient has otherwise gone well over a year without any seizures, she is on Keppra  taking 1500 mg twice daily.  The patient tolerates the drug fairly well.  She is also on Lyrica  taking 300 mg twice daily.  She does have some mild gait instability, she has not had any falls.   REVIEW OF SYSTEMS: Out of a complete 14 system review of symptoms, the patient complains only of the following symptoms, and all other reviewed systems are negative.  See HPI  ALLERGIES: Allergies  Allergen Reactions   Acetaminophen      Other reaction(s): GI upset, constipation if taken regularly    HOME MEDICATIONS: Outpatient Medications Prior to Visit  Medication Sig Dispense Refill   amLODipine  (NORVASC ) 10 MG tablet Take 1 tablet (10 mg total) by mouth daily. 90 tablet 3   Chlorphen-PE-Acetaminophen  (NOREL AD) 4-10-325 MG TABS Take 1 tablet by mouth  4 (four) times daily. 20 tablet 4   colchicine  0.6 MG tablet Take 1 tablet (0.6 mg total) by mouth daily. 90 tablet 1   empagliflozin  (JARDIANCE ) 25 MG TABS tablet Take 1 tablet (25 mg total) by mouth daily. 90 tablet 1   Evolocumab  (REPATHA  SURECLICK) 140 MG/ML SOAJ Inject 140 mg into the skin every 14 (fourteen) days. 6 mL 1   ezetimibe  (ZETIA ) 10 MG tablet Take 1 tablet (10 mg) by mouth at bedtime. 90 tablet 3   furosemide  (LASIX ) 40 MG tablet Take 1 tablet (40 mg total) by mouth at bedtime. 90 tablet 3   glucose blood (ONETOUCH VERIO) test strip Use to test blood glucose 4 times daily. 100 each 6   insulin  aspart (NOVOLOG  FLEXPEN) 100 UNIT/ML FlexPen Use as directed sliding scale 4-5 units daily subcutaneous if blood sugar over 150. Not to exceed more than 100units daily     meloxicam  (MOBIC ) 15 MG tablet Take 1 tablet (15 mg total) by mouth daily for arthritis 30 tablet 0   metFORMIN  (GLUCOPHAGE ) 500 MG tablet Take 1 tablet (500 mg) by mouth every evening as directed 90 tablet 1   montelukast  (SINGULAIR ) 10 MG tablet Take 1 tablet (10 mg  total) by mouth daily. 90 tablet 1   ONETOUCH DELICA LANCETS 33G MISC   3   potassium chloride  SA (KLOR-CON  M) 20 MEQ tablet Take 1 tablet (20 mEq total) by mouth daily. 90 tablet 3   pregabalin  (LYRICA ) 300 MG capsule Take 1 capsule (300 mg total) by mouth 2 (two) times daily. 180 capsule 1   spironolactone  (ALDACTONE ) 25 MG tablet Take 1 tablet (25 mg total) by mouth daily. 90 tablet 3   sucralfate  (CARAFATE ) 1 G tablet Take 1 g by mouth in the morning, at noon, and at bedtime.     telmisartan  (MICARDIS ) 80 MG tablet Take 1 tablet (80 mg total) by mouth daily. 90 tablet 3   levETIRAcetam  (KEPPRA ) 1000 MG tablet Take one (1) and a half (1/2) tablet in the morning and 2 (two) tablets at bedtime 320 tablet 3   No facility-administered medications prior to visit.    PAST MEDICAL HISTORY: Past Medical History:  Diagnosis Date   Arthritis    Cataract    left cataract removed in 2017   Compensated cirrhosis related to hepatitis C virus (HCV) (HCC) 03/06/2024   Diabetes mellitus    Diabetic neuropathy (HCC)    Diabetic neuropathy, type II diabetes mellitus (HCC)    Hepatitis C    took tx for    Hypertension    Hypokalemia    Knee pain    right    Morbidly obese (HCC)    NASH (nonalcoholic steatohepatitis) 03/06/2024   Nocturnal seizures (HCC) 05/14/2015   no bad seizure since 1980's   OSA on CPAP    uses cpap 2-3 week   Plantar fasciitis    both feet    PAST SURGICAL HISTORY: Past Surgical History:  Procedure Laterality Date   ABDOMINAL HYSTERECTOMY     partial   ANTERIOR CERVICAL DECOMP/DISCECTOMY FUSION N/A 02/24/2022   Procedure: CERVICAL FOUR- CERVICAL FIVE ANTERIOR CERVICAL DECOMPRESSION/DISCECTOMY FUSION ONE LEVEL WITH ALLOGRAFT AND PLATE;  Surgeon: Adah Acron, MD;  Location: [facility] OR;  Service: Orthopedics;  Laterality: N/A;   CATARACT EXTRACTION Left    COLONOSCOPY WITH PROPOFOL  N/A 08/21/2015   Procedure: COLONOSCOPY WITH PROPOFOL ;  Surgeon: Alvis Jourdain, MD;   Location: [facility];  Service: Endoscopy;  Laterality: N/A;  COLONOSCOPY WITH PROPOFOL  N/A 08/22/2022   Procedure: COLONOSCOPY WITH PROPOFOL ;  Surgeon: Alvis Jourdain, MD;  Location: [facility];  Service: Gastroenterology;  Laterality: N/A;   ESOPHAGOGASTRODUODENOSCOPY (EGD) WITH PROPOFOL  N/A 08/21/2015   Procedure: ESOPHAGOGASTRODUODENOSCOPY (EGD) WITH PROPOFOL ;  Surgeon: Alvis Jourdain, MD;  Location: [facility];  Service: Endoscopy;  Laterality: N/A;   ESOPHAGOGASTRODUODENOSCOPY (EGD) WITH PROPOFOL  N/A 10/08/2018   Procedure: ESOPHAGOGASTRODUODENOSCOPY (EGD) WITH PROPOFOL ;  Surgeon: Alvis Jourdain, MD;  Location: [facility];  Service: Endoscopy;  Laterality: N/A;   ESOPHAGOGASTRODUODENOSCOPY (EGD) WITH PROPOFOL  N/A 08/22/2022   Procedure: ESOPHAGOGASTRODUODENOSCOPY (EGD) WITH PROPOFOL ;  Surgeon: Alvis Jourdain, MD;  Location: [facility];  Service: Gastroenterology;  Laterality: N/A;   EYE SURGERY Bilateral    cataracts   POLYPECTOMY  08/22/2022   Procedure: POLYPECTOMY;  Surgeon: Alvis Jourdain, MD;  Location: [facility];  Service: Gastroenterology;;   TUBAL LIGATION      FAMILY HISTORY: Family History  Problem Relation Age of Onset   Diabetes Mother    Heart attack Mother    Diabetes Sister    Diabetes Brother    Cancer Sister        breast   Breast cancer Sister    Seizures Neg Hx     SOCIAL HISTORY: Social History   Socioeconomic History   Marital status: Single    Spouse name: Not on file   Number of children: 4   Years of education: 12   Highest education level: Not on file  Occupational History   Occupation: [employer]  Tobacco Use   Smoking status: Never   Smokeless tobacco: Never  Vaping Use   Vaping status: Never Used  Substance and Sexual Activity   Alcohol use: No    Alcohol/week: 0.0 standard drinks of alcohol   Drug use: No   Sexual activity: Never  Other Topics Concern   Not on file  Social History Narrative   Patient occasionally drinks  caffeine.   Patient is right handed.   Social Drivers of Corporate investment banker Strain: Not on file  Food Insecurity: Food Insecurity Present (04/10/2020)   Hunger Vital Sign    Worried About Running Out of Food in the Last Year: Sometimes true    Ran Out of Food in the Last Year: Never true  Transportation Needs: No Transportation Needs (04/10/2020)   PRAPARE - Administrator, Civil Service (Medical): No    Lack of Transportation (Non-Medical): No  Physical Activity: Not on file  Stress: Not on file  Social Connections: Not on file  Intimate Partner Violence: Not on file   PHYSICAL EXAM  Vitals:   03/15/24 1108  BP: 112/67  Pulse: 90  Resp: 17  Weight: 244 lb 3.2 oz (110.8 kg)  Height: 5\' 4"  (1.626 m)    Body mass index is 41.92 kg/m.  Generalized: Well developed, in no acute distress   Neurological examination  Mentation: Alert oriented to time, place, history taking. Follows all commands speech and language fluent Cranial nerve II-XII: Pupils were equal round reactive to light. Extraocular movements were full, visual field were full on confrontational test. Facial sensation and strength were normal. Head turning and shoulder shrug were normal and symmetric. Motor: The motor testing reveals 5 over 5 strength of all 4 extremities. Good symmetric motor tone is noted throughout.  Sensory: Sensory testing is intact to soft touch on all 4 extremities. No evidence of extinction is noted.  Coordination: Cerebellar testing reveals good finger-nose-finger and heel-to-shin bilaterally.  Gait and station: Gait is steady, slightly wide-based, antalgic  DIAGNOSTIC DATA (LABS, IMAGING, TESTING) - I reviewed patient records, labs, notes, testing and imaging myself where available.  Lab Results  Component Value Date   WBC 6.3 12/30/2022   HGB 14.7 12/30/2022   HCT 44.7 12/30/2022   MCV 90.3 12/30/2022   PLT 303 12/30/2022      Component Value Date/Time   NA 144  03/07/2024 0940   NA 142 09/17/2022 1610   K 3.7 03/07/2024 0940   CL 104 03/07/2024 0940   CO2 30 03/07/2024 0940   GLUCOSE 120 (H) 03/07/2024 0940   BUN 18 03/07/2024 0940   BUN 25 09/17/2022 1610   CREATININE 1.11 (H) 03/07/2024 0940   CALCIUM  10.2 03/07/2024 0940   PROT 7.1 03/07/2024 0940   PROT 7.2 08/11/2016 0833   ALBUMIN 5.0 12/30/2022 0957   ALBUMIN 4.5 08/11/2016 0833   AST 45 (H) 03/07/2024 0940   ALT 58 (H) 03/07/2024 0940   ALKPHOS 72 12/30/2022 0957   BILITOT 0.3 03/07/2024 0940   BILITOT <0.2 08/11/2016 0833   GFRNONAA >60 12/30/2022 0957   GFRNONAA 53 (L) 10/20/2016 1549   GFRAA 52 (L) 02/16/2017 1325   GFRAA 61 10/20/2016 1549   Lab Results  Component Value Date   CHOL 216 (H) 02/11/2014   HDL 65 02/11/2014   LDLCALC 133 (H) 02/11/2014   TRIG 92 02/11/2014   CHOLHDL 3.3 02/11/2014   Lab Results  Component Value Date   HGBA1C 6.1 (H) 02/17/2022   No results found for: "VITAMINB12" Lab Results  Component Value Date   TSH 1.680 09/10/2020   Jeanmarie Millet, AGNP-C, DNP 03/15/2024, 11:38 AM [facility] [address], [address] [place], [place] [address] [phone]

## 2024-03-15 NOTE — Telephone Encounter (Signed)
-----   Message from Wess Hammed sent at 03/15/2024 11:45 AM EDT ----- Order to DME for CPAP new machine

## 2024-03-15 NOTE — Patient Instructions (Signed)
 Continue Keppra  for seizure prevention.  Call for any seizure activity.  I will order a new CPAP machine.  Will need to see you 30-90 after starting your machine.

## 2024-03-16 LAB — LEVETIRACETAM LEVEL: Levetiracetam Lvl: 83.2 ug/mL — ABNORMAL HIGH (ref 10.0–40.0)

## 2024-03-17 ENCOUNTER — Ambulatory Visit: Payer: Self-pay | Admitting: Neurology

## 2024-03-18 ENCOUNTER — Other Ambulatory Visit (HOSPITAL_COMMUNITY)

## 2024-03-21 ENCOUNTER — Ambulatory Visit (HOSPITAL_COMMUNITY)
Admission: RE | Admit: 2024-03-21 | Discharge: 2024-03-21 | Disposition: A | Discharge status: Home / Self Care | Source: Ambulatory Visit | Attending: Infectious Disease | Admitting: Infectious Disease

## 2024-03-21 DIAGNOSIS — R772 Abnormality of alphafetoprotein: Secondary | ICD-10-CM | POA: Diagnosis not present

## 2024-03-21 DIAGNOSIS — B1920 Unspecified viral hepatitis C without hepatic coma: Secondary | ICD-10-CM | POA: Diagnosis not present

## 2024-03-21 DIAGNOSIS — K760 Fatty (change of) liver, not elsewhere classified: Secondary | ICD-10-CM | POA: Diagnosis not present

## 2024-03-21 DIAGNOSIS — C220 Liver cell carcinoma: Secondary | ICD-10-CM | POA: Diagnosis not present

## 2024-03-21 DIAGNOSIS — K7469 Other cirrhosis of liver: Secondary | ICD-10-CM | POA: Insufficient documentation

## 2024-03-21 DIAGNOSIS — N281 Cyst of kidney, acquired: Secondary | ICD-10-CM | POA: Diagnosis not present

## 2024-03-21 MED ORDER — GADOBUTROL 1 MMOL/ML IV SOLN
10.0000 mL | Freq: Once | INTRAVENOUS | Status: AC | PRN
Start: 2024-03-21 — End: 2024-03-21
  Administered 2024-03-21: 10 mL via INTRAVENOUS

## 2024-03-23 ENCOUNTER — Ambulatory Visit: Payer: Self-pay

## 2024-03-25 ENCOUNTER — Other Ambulatory Visit (HOSPITAL_COMMUNITY): Payer: Self-pay

## 2024-03-29 ENCOUNTER — Telehealth: Payer: Self-pay | Admitting: Neurology

## 2024-03-29 NOTE — Telephone Encounter (Signed)
 Placed for work in to sign

## 2024-03-29 NOTE — Telephone Encounter (Signed)
 Pt called to change the facility where she receives  her CPAP supplies . And Machine  Pt would like to receive supplies  and machine from  this facility . Also Pt states once Rx is sent can she be notified   [facility] [address], [place], [place] [address] Phone# [phone]

## 2024-03-30 ENCOUNTER — Other Ambulatory Visit: Payer: Self-pay

## 2024-03-30 ENCOUNTER — Other Ambulatory Visit (HOSPITAL_COMMUNITY): Payer: Self-pay

## 2024-03-30 NOTE — Telephone Encounter (Signed)
 Outgoing fax to [employer]

## 2024-03-31 ENCOUNTER — Other Ambulatory Visit (HOSPITAL_COMMUNITY): Payer: Self-pay

## 2024-03-31 ENCOUNTER — Other Ambulatory Visit: Payer: Self-pay | Admitting: Cardiovascular Disease

## 2024-03-31 MED ORDER — FUROSEMIDE 40 MG PO TABS
40.0000 mg | ORAL_TABLET | Freq: Every day | ORAL | 3 refills | Status: AC
  Filled 2024-03-31 – 2024-04-19 (×4): qty 90, 90d supply, fill #0
  Filled 2024-06-14 – 2024-06-30 (×2): qty 90, 90d supply, fill #1
  Filled 2024-07-18 – 2024-09-08 (×3): qty 90, 90d supply, fill #2

## 2024-03-31 NOTE — Telephone Encounter (Signed)
 negative *STAT* If patient is at the pharmacy, call can be transferred to refill team.   1. Which medications need to be refilled? (please list name of each medication and dose if known)  new Furosemide  2 times a day   2. Would you like to learn more about the convenience, safety, & potential cost savings by using the [facility]?   3. Are you open to using the [facility] (Type [facility].    4. Which pharmacy/location (including street and city if local pharmacy) is medication to be sent to?[facility]   5. Do they need a 30 day or 90 day supply? 90 days # 180 and refills- please call today- out of medicine

## 2024-03-31 NOTE — Telephone Encounter (Signed)
 Patient identification verified by 2 forms. Cathy Ellen, RN     Called and spoke to patient Patient states:  - She is out of lasix  and needs refill because she has been taking 40 mg twice daily.  - She stopped taking the spironolactone  because it made her feel not herself.   Patient denies:  - Abnormal symptoms at time of call.              Interventions/Plan: - Chart review shows patient prescribed furosemide (LASIX ) 40mg  daily and to add spironolactone (ALDACTONE ) 25 mg daily for fluid management per last ov note in April.  - Refill for lasix  sent to [facility] pharmacy.  - Patient will call back if she develops new/worsening symptoms once re-starting spironolactone .    Reviewed ED warning signs/precautions  Patient agrees with plan, no questions at this time

## 2024-03-31 NOTE — Telephone Encounter (Signed)
 Called pt to inform her that she does not take furosemide  40 mg tablet BID, she only takes this medication daily. Pt stated that she was having side effects with taking spirolactone and stop taking this medication and increased her furosemide  40 mg to BID. Pt would like a call back concerning this matter. Please address

## 2024-04-01 ENCOUNTER — Other Ambulatory Visit (HOSPITAL_COMMUNITY): Payer: Self-pay

## 2024-04-01 MED ORDER — CHLORPHEN-PE-ACETAMINOPHEN 4-10-325 MG PO TABS
1.0000 | ORAL_TABLET | Freq: Four times a day (QID) | ORAL | 4 refills | Status: AC
  Filled 2024-04-01: qty 20, 5d supply, fill #0
  Filled 2024-04-19 – 2024-08-01 (×4): qty 20, 5d supply, fill #1
  Filled 2024-08-19: qty 20, 5d supply, fill #2
  Filled 2024-10-05: qty 20, 5d supply, fill #3

## 2024-04-04 ENCOUNTER — Other Ambulatory Visit: Payer: Self-pay

## 2024-04-04 ENCOUNTER — Other Ambulatory Visit: Payer: Self-pay | Admitting: Family Medicine

## 2024-04-04 ENCOUNTER — Other Ambulatory Visit (HOSPITAL_COMMUNITY): Payer: Self-pay

## 2024-04-04 DIAGNOSIS — Z1231 Encounter for screening mammogram for malignant neoplasm of breast: Secondary | ICD-10-CM

## 2024-04-07 ENCOUNTER — Other Ambulatory Visit (HOSPITAL_COMMUNITY): Payer: Self-pay

## 2024-04-07 MED ORDER — NOVOLOG FLEXPEN 100 UNIT/ML ~~LOC~~ SOPN
PEN_INJECTOR | SUBCUTANEOUS | 3 refills | Status: AC
  Filled 2024-04-07: qty 15, 15d supply, fill #0
  Filled 2024-04-19: qty 15, 15d supply, fill #1
  Filled 2024-05-12: qty 15, 15d supply, fill #2
  Filled 2024-08-19: qty 15, 15d supply, fill #3

## 2024-04-07 MED ORDER — SUCRALFATE 1 G PO TABS
1.0000 g | ORAL_TABLET | ORAL | 4 refills | Status: DC
  Filled 2024-04-07: qty 120, 30d supply, fill #0
  Filled 2024-05-05: qty 120, 30d supply, fill #1
  Filled 2024-06-14: qty 120, 30d supply, fill #2
  Filled 2024-07-18: qty 120, 30d supply, fill #3
  Filled 2024-08-17: qty 120, 30d supply, fill #4

## 2024-04-09 ENCOUNTER — Other Ambulatory Visit (HOSPITAL_COMMUNITY): Payer: Self-pay

## 2024-04-18 ENCOUNTER — Ambulatory Visit: Admitting: Orthopedic Surgery

## 2024-04-18 ENCOUNTER — Other Ambulatory Visit (INDEPENDENT_AMBULATORY_CARE_PROVIDER_SITE_OTHER): Payer: Self-pay

## 2024-04-18 VITALS — BP 141/82 | HR 88 | Ht 64.0 in | Wt 240.0 lb

## 2024-04-18 DIAGNOSIS — M5416 Radiculopathy, lumbar region: Secondary | ICD-10-CM

## 2024-04-18 DIAGNOSIS — G4733 Obstructive sleep apnea (adult) (pediatric): Secondary | ICD-10-CM | POA: Diagnosis not present

## 2024-04-18 NOTE — Addendum Note (Signed)
 Addended by: GEORGINA SHARPER on: 04/18/2024 09:02 PM   Modules accepted: Level of Service

## 2024-04-18 NOTE — Progress Notes (Addendum)
 Orthopedic Spine Surgery Office Note  Assessment: Patient is a 78 y.o. female with low back pain that radiates into her bilateral posterior thighs and legs with standing and walking but improves with sitting, consistent with neurogenic claudication   Plan: -Explained that initially conservative treatment is tried as a significant number of patients may experience relief with these treatment modalities. Discussed that the conservative treatments include:  -activity modification  -physical therapy  -over the counter pain medications  -medrol  dosepak  -lumbar steroid injections -Patient has tried PT, meloxicam , lyrica , lumbar steroid injections -Recommended repeat lumbar steroid injection since she got 1 in the past that was helpful.  Her last one was in January 2025 -Would need an A1c of 7.5 or less prior to any elective spine surgery -If she is not any better at her next visit, will order an MRI to evaluate for spinal stenosis -Patient should return to office on an as needed basis   Patient expressed understanding of the plan and all questions were answered to the patient's satisfaction.   ___________________________________________________________________________   History:  Patient is a 77 y.o. female who presents today for lumbar spine.  Patient has had many years of low back pain and has more recently noticed radiation of the pain into her bilateral lower extremities.  She has had the pain radiating into her legs now for the last 3 years.  It has gotten progressively worse with time.  She feels the pain radiating along the posterior aspect of her thighs and legs.  She notices the leg pain when she is standing or walking.  It gets better if she sits down or leans forward.  There is no trauma or injury that preceded the onset of her pain.  Patient did find good relief with an injection which she got in January 2025.  She said she got about 4 months of relief with that injection and  estimates that it helped about 80%.   Weakness: Denies Symptoms of imbalance: Denies Paresthesias and numbness: Yes, has numbness and tingling in her feet from neuropathy.  No recent changes Bowel or bladder incontinence: Denies Saddle anesthesia: Denies  Treatments tried: PT, meloxicam , lyrica , lumbar steroid injections  Review of systems: Denies fevers and chills, night sweats, unexplained weight loss, history of cancer, pain that wakes them at night  Past medical history: HLD HTN GERD Hepatitis Cirrhosis Diabetes Neuropathy OSA Epilepsy  Allergies: tylenol   Past surgical history:  Hysterectomy C4/5 ACDF Polypectomy Cataract surgery  Social history: Denies use of nicotine product (smoking, vaping, patches, smokeless) Alcohol use: Denies Denies recreational drug use   Physical Exam:  BMI of 41.2  General: no acute distress, appears stated age Neurologic: alert, answering questions appropriately, following commands Respiratory: unlabored breathing on room air, symmetric chest rise Psychiatric: appropriate affect, normal cadence to speech   MSK (spine):  -Strength exam      Left  Right EHL    5/5  5/5 TA    5/5  5/5 GSC    5/5  5/5 Knee extension  5/5  5/5 Hip flexion   5/5  5/5  -Sensory exam    Sensation intact to light touch in L3-S1 nerve distributions of bilateral lower extremities  -Achilles DTR: 1/4 on the left, 1/4 on the right -Patellar tendon DTR: 1/4 on the left, 1/4 on the right  -Straight leg raise: Negative bilaterally -Clonus: no beats bilaterally  -Left hip exam: No pain through range of motion -Right hip exam: No pain through range of  motion  Imaging: XRs of the lumbar spine from 04/18/2024 were independently reviewed and interpreted, showing loss of lumbar lordosis.  Disc height loss with anterior osteophyte formation between the L2 vertebra and S1 vertebra.  No evidence of instability on flexion/extension views.  No fracture  or dislocation seen.   Patient name: Cathy Gonzalez Patient MRN: [id number] Date of visit: 04/18/24

## 2024-04-19 ENCOUNTER — Other Ambulatory Visit (HOSPITAL_BASED_OUTPATIENT_CLINIC_OR_DEPARTMENT_OTHER): Payer: Self-pay

## 2024-04-19 ENCOUNTER — Other Ambulatory Visit: Payer: Self-pay

## 2024-04-19 ENCOUNTER — Other Ambulatory Visit (HOSPITAL_COMMUNITY): Payer: Self-pay

## 2024-04-20 ENCOUNTER — Other Ambulatory Visit (HOSPITAL_COMMUNITY): Payer: Self-pay

## 2024-04-20 MED ORDER — PREGABALIN 300 MG PO CAPS
300.0000 mg | ORAL_CAPSULE | Freq: Two times a day (BID) | ORAL | 1 refills | Status: DC
  Filled 2024-04-22: qty 180, 90d supply, fill #0
  Filled 2024-05-05 – 2024-07-21 (×7): qty 180, 90d supply, fill #1
  Filled ????-??-??: fill #1

## 2024-04-21 ENCOUNTER — Other Ambulatory Visit (HOSPITAL_COMMUNITY): Payer: Self-pay

## 2024-04-22 ENCOUNTER — Other Ambulatory Visit (HOSPITAL_COMMUNITY): Payer: Self-pay

## 2024-04-25 ENCOUNTER — Other Ambulatory Visit (HOSPITAL_COMMUNITY): Payer: Self-pay

## 2024-04-30 ENCOUNTER — Other Ambulatory Visit (HOSPITAL_COMMUNITY): Payer: Self-pay

## 2024-05-05 ENCOUNTER — Other Ambulatory Visit: Payer: Self-pay

## 2024-05-05 ENCOUNTER — Other Ambulatory Visit (HOSPITAL_COMMUNITY): Payer: Self-pay

## 2024-05-09 ENCOUNTER — Ambulatory Visit (INDEPENDENT_AMBULATORY_CARE_PROVIDER_SITE_OTHER): Admitting: Podiatry

## 2024-05-09 ENCOUNTER — Encounter: Payer: Self-pay | Admitting: Podiatry

## 2024-05-09 DIAGNOSIS — M79675 Pain in left toe(s): Secondary | ICD-10-CM

## 2024-05-09 DIAGNOSIS — M6289 Other specified disorders of muscle: Secondary | ICD-10-CM | POA: Diagnosis not present

## 2024-05-09 DIAGNOSIS — I890 Lymphedema, not elsewhere classified: Secondary | ICD-10-CM

## 2024-05-09 DIAGNOSIS — B351 Tinea unguium: Secondary | ICD-10-CM | POA: Diagnosis not present

## 2024-05-09 DIAGNOSIS — M79674 Pain in right toe(s): Secondary | ICD-10-CM | POA: Diagnosis not present

## 2024-05-09 DIAGNOSIS — E1151 Type 2 diabetes mellitus with diabetic peripheral angiopathy without gangrene: Secondary | ICD-10-CM

## 2024-05-09 NOTE — Progress Notes (Signed)
 Subjective:  Patient ID: Cathy Gonzalez, female    DOB: October 30, 1945,  MRN: [id number]  Cathy Gonzalez presents to clinic today for:  Chief Complaint  Patient presents with   Diabetes    Pt is here for Diabetic foot care. Toe nail trim. She states as soon as she gets up both feet start swelling and she has bilateral pain in plantar metatarsal area. he is using aspercream and diabetic stockings and that seem to help a lot Diabetic and last A1c is 6.1   Patient notes nails are thick and elongated, causing pain in shoe gear when ambulating.  She has secondary concern of tightness in both calf muscles.  She notes that she has continued chronic swelling in both legs and ankles and spreading down to her feet.    PCP is Benjamine Aland, MD.  Past Medical History:  Diagnosis Date   Arthritis    Cataract    left cataract removed in 2017   Compensated cirrhosis related to hepatitis C virus (HCV) (HCC) 03/06/2024   Diabetes mellitus    Diabetic neuropathy (HCC)    Diabetic neuropathy, type II diabetes mellitus (HCC)    Hepatitis C    took tx for    Hypertension    Hypokalemia    Knee pain    right    Morbidly obese (HCC)    NASH (nonalcoholic steatohepatitis) 03/06/2024   Nocturnal seizures (HCC) 05/14/2015   no bad seizure since 1980's   OSA on CPAP    uses cpap 2-3 week   Plantar fasciitis    both feet   Allergies  Allergen Reactions   Acetaminophen      Other reaction(s): GI upset, constipation if taken regularly    Objective:  Cathy Gonzalez is a pleasant 78 y.o. female in NAD. AAO x 3.  Vascular Examination: Patient has palpable DP pulse, absent PT pulse bilateral.  Delayed capillary refill bilateral toes.  Sparse digital hair bilateral.  Proximal to distal cooling WNL bilateral.  +1 pitting edema b/l legs, ankles and feet.  Right ankle measures 11.75 inches in the left ankle measures 12.0 inches.  The right calf measures 16.50 inches in circumference and the left calf measures  16.25 inches in circumference.  Dermatological Examination: Interspaces are clear with no open lesions noted bilateral.  Skin is shiny and atrophic bilateral.  Nails are 3-67mm thick, with yellowish/brown discoloration, subungual debris and distal onycholysis x10.  There is pain with compression of nails x10.    Patient qualifies for at-risk foot care because of diabetes with PVD.  Assessment/Plan: 1. Pain due to onychomycosis of toenails of both feet   2. Type II diabetes mellitus with peripheral circulatory disorder (HCC)   3. Lymphedema   4. Tightness of both gastrocnemius muscles     Mycotic nails x10 were sharply debrided with sterile nail nippers and power debriding burr to decrease bulk and length.  Measurements were obtained of both legs today to get her started with the lymphedema compression pumps at home.  She was informed that our representative will be contacting her insurance company  She was given Stretching exercises at checkout.  She was informed that the ball of the foot pain is typically due to tight calf muscles.  The swelling is making it more difficult for her to gain her flexibility at the ankles because it will make the ankle stiff.  I think combining the lymphedema pumps with Stretching exercises should help with both issues going forward.  Return in about 3 months (around 08/09/2024) for [facility].   Awanda CHARM Imperial, DPM, FACFAS [facility]     [address] [place], [place] [address]                Office [phone]  Fax [phone]

## 2024-05-09 NOTE — Patient Instructions (Signed)

## 2024-05-13 ENCOUNTER — Other Ambulatory Visit (HOSPITAL_COMMUNITY): Payer: Self-pay

## 2024-05-19 DIAGNOSIS — G4733 Obstructive sleep apnea (adult) (pediatric): Secondary | ICD-10-CM | POA: Diagnosis not present

## 2024-05-20 ENCOUNTER — Other Ambulatory Visit (HOSPITAL_COMMUNITY): Payer: Self-pay

## 2024-05-23 ENCOUNTER — Ambulatory Visit: Admitting: Physical Medicine and Rehabilitation

## 2024-05-23 ENCOUNTER — Other Ambulatory Visit: Payer: Self-pay

## 2024-05-23 VITALS — BP 145/83 | HR 86

## 2024-05-23 DIAGNOSIS — M5416 Radiculopathy, lumbar region: Secondary | ICD-10-CM | POA: Diagnosis not present

## 2024-05-23 MED ORDER — METHYLPREDNISOLONE ACETATE 40 MG/ML IJ SUSP
40.0000 mg | Freq: Once | INTRAMUSCULAR | Status: AC
  Administered 2024-05-23: 40 mg

## 2024-05-23 NOTE — Patient Instructions (Signed)

## 2024-05-23 NOTE — Progress Notes (Signed)
 Pain Scale   Average Pain 7 Patient advising she has chronic lower back pain radiating to both legs. Her pain is constant        +Driver, -BT, -Dye Allergies.

## 2024-05-24 ENCOUNTER — Encounter: Payer: Self-pay | Admitting: Neurology

## 2024-05-24 ENCOUNTER — Ambulatory Visit (INDEPENDENT_AMBULATORY_CARE_PROVIDER_SITE_OTHER): Admitting: Neurology

## 2024-05-24 VITALS — BP 144/84 | HR 75 | Ht 64.0 in | Wt 246.0 lb

## 2024-05-24 DIAGNOSIS — Z6841 Body Mass Index (BMI) 40.0 and over, adult: Secondary | ICD-10-CM

## 2024-05-24 DIAGNOSIS — K766 Portal hypertension: Secondary | ICD-10-CM

## 2024-05-24 DIAGNOSIS — M1000 Idiopathic gout, unspecified site: Secondary | ICD-10-CM

## 2024-05-24 DIAGNOSIS — E66813 Obesity, class 3: Secondary | ICD-10-CM

## 2024-05-24 DIAGNOSIS — G63 Polyneuropathy in diseases classified elsewhere: Secondary | ICD-10-CM | POA: Diagnosis not present

## 2024-05-24 DIAGNOSIS — G4733 Obstructive sleep apnea (adult) (pediatric): Secondary | ICD-10-CM

## 2024-05-24 DIAGNOSIS — R569 Unspecified convulsions: Secondary | ICD-10-CM

## 2024-05-24 DIAGNOSIS — I480 Paroxysmal atrial fibrillation: Secondary | ICD-10-CM | POA: Diagnosis not present

## 2024-05-24 NOTE — Progress Notes (Signed)
 Provider:  Dedra Gores, MD  Primary Care Physician:  Benjamine Aland, MD [address] [place] [address]     Referring Provider: Benjamine Aland, Md [address] [place],  [place] [address]          Chief Complaint according to patient   Patient presents with:                HISTORY OF PRESENT ILLNESS:  Cathy Gonzalez is a 78 y.o. female patient who is here for revisit 05/24/2024,  she is a former dr jenel patient and has seen me for a sleep consultation in  02-2019 . She has been followed by dr jenel and several NPs here at [facility] or nocturnal seizures, now sees Dr. Gregg for the seizures.  When she doesn't have a CPAP available her seizures returned.  She felt drained and fatigued.  She can even while on CPAP feel ES, and may sleep 13 hours on a "Sunday.     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    The patient's auto titrating CPAP machine is a ResMed model and she uses it nightly she uses it daily over 4 hours.  So she has 100% compliance.  Her residual AHI is 1.7/h and settings need no adjustment, 7-15 cm water EPR and she needs a new humidifier chamber.       Sleep and medical history: 'Nocturnal epilepsy - treated by Dr Willis for seizures that only occur  at night- in sleep. She is  on Keppra and Lyrica. Morbid obesity- Gout, hyperlipidemia and DM - Insulin user, also on Metformin. DM related progressed Neuropathy and Osteoarthritis. Takes multiple antihypertensives and lasix.      Family sleep history: Mother with DM and heart disease. On insulin. She passed in 2019.  Father died when she was 14, brother has DM, sister has DM, and breast cancer,    Social history: no coffee, no iced tea, no sodas.  She worked night shifts, late shifts - now is on day shift. Part time,  Grandson was in trouble with substance use, alcoholism , and he got rehab. Sober  since July 2025.  4 adult children:  3 sons and one daughter are all healthy.  She raised 2 grandchildren while her son was in the military, and daughter's child while the biological father was imprisoned.      20" 19: consult Cathy Gonzalez is a 78 y.o. female patient  wth known sleep apnea , but she couldn't use CPAP in the past.  The exam and interview by video was extremely difficult as the patient moved her camera- phone constantly, and also touched the screen on phone throughout the exam -after being multiple times obstructed to put the phone on a stand and leave it be.  Tv was running in the background, the Tv reflecting in he glasses and when I asked her to put the phone down on a table and lean it onto a book, flower pot, what ever - she placed the phone on the table facing the ceiling- we tried this 3 times before I gave up.  He son has noted her snoring much louder than he remembered, and has recently spent time at her place.  Her original sleep study took place about 2012-13, ordered by Dr  Bland. She had been diagnosed and ordered to use a CPAP from [facility], but choked on the water . She has had light sleep related seizures, the last in December 2019.   I could not find out remember to use CPAP the last time, but it seems that even when she used CPAP she will use it daily but only 2 or 3 times a week.  I made her aware that with that low compliance she would not be able to get the machine's cost covered, nor the supplies- equipment.  Chief complaint according to patient : My son told me my snoring got worse- he visited from [place]  In March -wants me to look into CPAP again.     Review of Systems: Out of a complete 14 system review, the patient complains of only the following symptoms, and all other reviewed systems are negative.:     Sitting and Reading?3 Watching Television?3 Sitting inactive in a public place (theater or meeting)?1 Lying down in the afternoon when circumstances  permit?3 Sitting and talking to someone?1 Sitting quietly after lunch without alcohol?2 In a car, while stopped for a few minutes in traffic?0 As a passenger in a car for an hour without a break?1   Total = 4 down from  14/ 24   FSS at 41 points GDS : 2/ 15    Son described thunderous snoring and interrupted breathing before CPAP. There were many more nighttime seizures. .       Social History   Socioeconomic History   Marital status: Single    Spouse name: Not on file   Number of children: 4   Years of education: 12   Highest education level: Not on file  Occupational History   Occupation: [employer]  Tobacco Use   Smoking status: Never   Smokeless tobacco: Never  Vaping Use   Vaping status: Never Used  Substance and Sexual Activity   Alcohol use: No    Alcohol/week: 0.0 standard drinks of alcohol   Drug use: No   Sexual activity: Never  Other Topics Concern   Not on file  Social History Narrative   Patient occasionally drinks caffeine.   Patient is right handed.   Social Drivers of Corporate investment banker Strain: Not on file  Food Insecurity: Food Insecurity Present (04/10/2020)   Hunger Vital Sign    Worried About Running Out of Food in the Last Year: Sometimes true    Ran Out of Food in the Last Year: Never true  Transportation Needs: No Transportation Needs (04/10/2020)   PRAPARE - Administrator, Civil Service (Medical): No    Lack of Transportation (Non-Medical): No  Physical Activity: Not on file  Stress: Not on file  Social Connections: Not on file    Family History  Problem Relation Age of Onset   Diabetes Mother    Heart attack Mother    Diabetes Sister    Diabetes Brother    Cancer Sister        breast   Breast cancer Sister    Seizures Neg Hx     Past Medical History:  Diagnosis Date   Arthritis    Cataract    left cataract removed in 2017   Compensated cirrhosis related to hepatitis C virus (HCV) (HCC) 03/06/2024    Diabetes mellitus    Diabetic neuropathy (HCC)    Diabetic neuropathy, type II diabetes mellitus (HCC)    Hepatitis C    took tx  for    Hypertension    Hypokalemia    Knee pain    right    Morbidly obese (HCC)    NASH (nonalcoholic steatohepatitis) 03/06/2024   Nocturnal seizures (HCC) 05/14/2015   no bad seizure since 1980's   OSA on CPAP    uses cpap 2-3 week   Plantar fasciitis    both feet    Past Surgical History:  Procedure Laterality Date   ABDOMINAL HYSTERECTOMY     partial   ANTERIOR CERVICAL DECOMP/DISCECTOMY FUSION N/A 02/24/2022   Procedure: CERVICAL FOUR- CERVICAL FIVE ANTERIOR CERVICAL DECOMPRESSION/DISCECTOMY FUSION ONE LEVEL WITH ALLOGRAFT AND PLATE;  Surgeon: Barbarann Oneil BROCKS, MD;  Location: [facility] OR;  Service: Orthopedics;  Laterality: N/A;   CATARACT EXTRACTION Left    CERVICAL SPINE SURGERY  05/2023   COLONOSCOPY WITH PROPOFOL  N/A 08/21/2015   Procedure: COLONOSCOPY WITH PROPOFOL ;  Surgeon: Belvie Just, MD;  Location: [facility] ENDOSCOPY;  Service: Endoscopy;  Laterality: N/A;   COLONOSCOPY WITH PROPOFOL  N/A 08/22/2022   Procedure: COLONOSCOPY WITH PROPOFOL ;  Surgeon: Just Belvie, MD;  Location: [facility] ENDOSCOPY;  Service: Gastroenterology;  Laterality: N/A;   ESOPHAGOGASTRODUODENOSCOPY (EGD) WITH PROPOFOL  N/A 08/21/2015   Procedure: ESOPHAGOGASTRODUODENOSCOPY (EGD) WITH PROPOFOL ;  Surgeon: Belvie Just, MD;  Location: [facility];  Service: Endoscopy;  Laterality: N/A;   ESOPHAGOGASTRODUODENOSCOPY (EGD) WITH PROPOFOL  N/A 10/08/2018   Procedure: ESOPHAGOGASTRODUODENOSCOPY (EGD) WITH PROPOFOL ;  Surgeon: Just Belvie, MD;  Location: [facility];  Service: Endoscopy;  Laterality: N/A;   ESOPHAGOGASTRODUODENOSCOPY (EGD) WITH PROPOFOL  N/A 08/22/2022   Procedure: ESOPHAGOGASTRODUODENOSCOPY (EGD) WITH PROPOFOL ;  Surgeon: Just Belvie, MD;  Location: [facility];  Service: Gastroenterology;  Laterality: N/A;   EYE SURGERY Bilateral    cataracts   POLYPECTOMY  08/22/2022    Procedure: POLYPECTOMY;  Surgeon: Just Belvie, MD;  Location: [facility];  Service: Gastroenterology;;   TUBAL LIGATION       Current Outpatient Medications on File Prior to Visit  Medication Sig Dispense Refill   amLODipine  (NORVASC ) 10 MG tablet Take 1 tablet (10 mg total) by mouth daily. 90 tablet 3   Chlorphen-PE-Acetaminophen  (NOREL AD) 4-10-325 MG TABS Take 1 tablet by mouth 4 (four) times daily. 20 tablet 4   colchicine  0.6 MG tablet Take 1 tablet (0.6 mg total) by mouth daily. 90 tablet 1   empagliflozin  (JARDIANCE ) 25 MG TABS tablet Take 1 tablet (25 mg total) by mouth daily. 90 tablet 1   Evolocumab  (REPATHA  SURECLICK) 140 MG/ML SOAJ Inject 140 mg into the skin every 14 (fourteen) days. 6 mL 1   ezetimibe  (ZETIA ) 10 MG tablet Take 1 tablet (10 mg) by mouth at bedtime. 90 tablet 3   furosemide  (LASIX ) 40 MG tablet Take 1 tablet (40 mg total) by mouth at bedtime. 90 tablet 3   glucose blood (ONETOUCH VERIO) test strip Use to test blood glucose 4 times daily. 100 each 6   insulin  aspart (NOVOLOG  FLEXPEN) 100 UNIT/ML FlexPen Use as directed sliding scale 4-5 units daily subcutaneous if blood sugar over 150. Not to exceed more than 100units daily     insulin  aspart (NOVOLOG  FLEXPEN) 100 UNIT/ML FlexPen Use as directed sliding scale 4-5 units daily subcutaneously if blood sugar over 150. Not to exceed more than 100units daily 15 mL 3   levETIRAcetam  (KEPPRA ) 1000 MG tablet Take 1.5 tablets by mouth in the morning and 2 tablets at bedtime 320 tablet 3   meloxicam  (MOBIC ) 15 MG tablet Take 1 tablet (15 mg total) by mouth daily for arthritis  30 tablet 0   metFORMIN  (GLUCOPHAGE ) 500 MG tablet Take 1 tablet (500 mg) by mouth every evening as directed 90 tablet 1   montelukast  (SINGULAIR ) 10 MG tablet Take 1 tablet (10 mg total) by mouth daily. 90 tablet 1   ONETOUCH DELICA LANCETS 33G MISC   3   potassium chloride  SA (KLOR-CON  M) 20 MEQ tablet Take 1 tablet (20 mEq total) by mouth daily. 90  tablet 3   pregabalin  (LYRICA ) 300 MG capsule Take 1 capsule (300 mg total) by mouth 2 (two) times daily. 180 capsule 1   spironolactone  (ALDACTONE ) 25 MG tablet Take 1 tablet (25 mg total) by mouth daily. 90 tablet 3   sucralfate  (CARAFATE ) 1 G tablet Take 1 g by mouth in the morning, at noon, and at bedtime.     sucralfate  (CARAFATE ) 1 g tablet Take 1 tablet (1 g total) by mouth daily with meals and at bedtime 120 tablet 4   telmisartan  (MICARDIS ) 80 MG tablet Take 1 tablet (80 mg total) by mouth daily. 90 tablet 3   [DISCONTINUED] Potassium Chloride  ER 20 MEQ TBCR Take 1 tablet (20 mEq total) by mouth daily. (Patient not taking: Reported on 03/15/2024) 90 tablet 0   Current Facility-Administered Medications on File Prior to Visit  Medication Dose Route Frequency Provider Last Rate Last Admin   methylPREDNISolone  acetate (DEPO-MEDROL ) injection 40 mg  40 mg Other Once         Allergies  Allergen Reactions   Acetaminophen      Other reaction(s): GI upset, constipation if taken regularly     DIAGNOSTIC DATA (LABS, IMAGING, TESTING) - I reviewed patient records, labs, notes, testing and imaging myself where available.  Lab Results  Component Value Date   WBC 6.3 12/30/2022   HGB 14.7 12/30/2022   HCT 44.7 12/30/2022   MCV 90.3 12/30/2022   PLT 303 12/30/2022      Component Value Date/Time   NA 144 03/07/2024 0940   NA 142 09/17/2022 1610   K 3.7 03/07/2024 0940   CL 104 03/07/2024 0940   CO2 30 03/07/2024 0940   GLUCOSE 120 (H) 03/07/2024 0940   BUN 18 03/07/2024 0940   BUN 25 09/17/2022 1610   CREATININE 1.11 (H) 03/07/2024 0940   CALCIUM  10.2 03/07/2024 0940   PROT 7.1 03/07/2024 0940   PROT 7.2 08/11/2016 0833   ALBUMIN 5.0 12/30/2022 0957   ALBUMIN 4.5 08/11/2016 0833   AST 45 (H) 03/07/2024 0940   ALT 58 (H) 03/07/2024 0940   ALKPHOS 72 12/30/2022 0957   BILITOT 0.3 03/07/2024 0940   BILITOT <0.2 08/11/2016 0833   GFRNONAA >60 12/30/2022 0957   GFRNONAA 53 (L)  10/20/2016 1549   GFRAA 52 (L) 02/16/2017 1325   GFRAA 61 10/20/2016 1549   Lab Results  Component Value Date   CHOL 216 (H) 02/11/2014   HDL 65 02/11/2014   LDLCALC 133 (H) 02/11/2014   TRIG 92 02/11/2014   CHOLHDL 3.3 02/11/2014   Lab Results  Component Value Date   HGBA1C 6.1 (H) 02/17/2022   No results found for: VITAMINB12 Lab Results  Component Value Date   TSH 1.680 09/10/2020    PHYSICAL EXAM:  Vitals:   05/24/24 1117  BP: (!) 144/84  Pulse: 75   No data found. Body mass index is 42.23 kg/m.   Wt Readings from Last 3 Encounters:  05/24/24 246 lb (111.6 kg)  04/18/24 240 lb (108.9 kg)  03/15/24 244 lb 3.2 oz (110.8 kg)  Ht Readings from Last 3 Encounters:  05/24/24 5' 4 (1.626 m)  04/18/24 5' 4 (1.626 m)  03/15/24 5' 4 (1.626 m)      General: The patient is awake, alert and appears not in acute distress and groomed. Head: Normocephalic, atraumatic.  Neck is supple.   Mallampati 3 plus ( stage 4 by older nomenclature) ,  neck circumference:18 inches .   Nasal airflow fully patent.   Overbite Dwan is  seen.  Dental status:  biological, gaps.  Cardiovascular:  Regular rate and cardiac rhythm by pulse, without distended neck veins. Respiratory: no shortness of breath  Skin:  With evidence of ankle edema Trunk: BMI is 42.23    NEUROLOGIC EXAM: The patient is awake and alert, oriented to place and time.   Memory subjective described as intact.  Attention span & concentration ability appears normal.   Speech is fluent, but the patient speaks very loudly. Suspect severe hearing loss.  Mood and affect are appropriate.   Cranial nerves: denies loss of taste or smell  Extraocular movements  in vertical and horizontal planes intact . Hearing appears severely impaired.- she speaks loudly.   Facial sensation intact to fine touch.  Facial motor strength is symmetric and the tongue and uvula move midline. Shoulder shrug was symmetrical.    Neck ROM is limited, status post anterior fusion,  large neck.   Motor exam:  Normal tone, muscle bulk and symmetric strength in both extremities.  She has weakness in grip strength both sides.  Fits carpal tunnel.   Difficulties holding a key or opening a jar.    Sensory: numbness fingertips  and feet. Tingling , pin and needles.  Loss of vibration sense.    Gait and Station: walks with cane -  kept in her right hand.   . ASSESSMENT AND PLAN :   78 y.o. year old female  here with:    1) Morbid obesity, OSA and residual fatigue on CPAP.   Highly compliant   2) Seizure disorder at night, nocturnal seizures-  all of them .  The seizures became well controlled with the control of apnea.  No refills needed.    3) DM2 with neuropathy. Bilateral carpal tunnel. Walking with a cane and chronic neck and back pain.   Plan : main risk factor for OSA is weight , but she is unable to exercise, has a lot of movement restrictions and pain , she reports having cut down on meal size - she eats lean protein, prefers beef and chicken. I don't know if she could be a zepbound  candidate, but the FDA will authorize the medication based on the presence of moderate to severe sleep apnea.  She is diabetic.   Rv in 12 months with NP.     I would like to thank  Benjamine Aland, Md [address] [place],  [place] [address] for allowing me to meet with this pleasant patient.   Sleep Clinic Patients are generally offered input on sleep hygiene, life style changes and how to improve compliance with medical treatment where applicable. Review and reiteration of good sleep hygiene measures is offered to any sleep clinic patient, be it in the first consultation or with any follow up visits.    Any patient with sleepiness should be cautioned not to drive, work at heights, or operate dangerous or heavy equipment when feeling tired or sleepy.  The referring provider will be notified of the test results.   The  patient's condition requires frequent monitoring and adjustments in the treatment plan, reflecting the ongoing complexity of care.  This provider is the continuing focal point for all needed services for this condition.  After spending a total time of  35  minutes face to face and time for  history taking, physical and neurologic examination, review of laboratory studies,  personal review of imaging studies, reports and results of other testing and review of referral information / records as far as provided in visit,   Electronically signed by: Dedra Gores, MD 05/24/2024 11:45 AM  [facility] Board certified by The [employer] of Sleep Medicine and Diplomate of the [employer] of Sleep Medicine. Board certified In Neurology through the ABPN, Fellow of the [employer] of Neu[employer].

## 2024-05-29 NOTE — Procedures (Signed)
 Lumbar Epidural Steroid Injection - Interlaminar Approach with Fluoroscopic Guidance  Patient: Cathy Gonzalez      Date of Birth: 1946/04/17 MRN: [id number] PCP: Benjamine Aland, MD      Visit Date: 05/23/2024   Universal Protocol:     Consent Given By: the patient  Position: PRONE  Additional Comments: Vital signs were monitored before and after the procedure. Patient was prepped and draped in the usual sterile fashion. The correct patient, procedure, and site was verified.   Injection Procedure Details:   Procedure diagnoses: Lumbar radiculopathy [M54.16]   Meds Administered:  Meds ordered this encounter  Medications   methylPREDNISolone  acetate (DEPO-MEDROL ) injection 40 mg     Laterality: Right  Location/Site:  L5-S1  Needle: 4.5 in., 20 ga. Tuohy  Needle Placement: Paramedian epidural  Findings:   -Comments: Excellent flow of contrast into the epidural space.  Procedure Details: Using a paramedian approach from the side mentioned above, the region overlying the inferior lamina was localized under fluoroscopic visualization and the soft tissues overlying this structure were infiltrated with 4 ml. of 1% Lidocaine  without Epinephrine . The Tuohy needle was inserted into the epidural space using a paramedian approach.   The epidural space was localized using loss of resistance along with counter oblique bi-planar fluoroscopic views.  After negative aspirate for air, blood, and CSF, a 2 ml. volume of Isovue -250 was injected into the epidural space and the flow of contrast was observed. Radiographs were obtained for documentation purposes.    The injectate was administered into the level noted above.   Additional Comments:  The patient tolerated the procedure well Dressing: 2 x 2 sterile gauze and Band-Aid    Post-procedure details: Patient was observed during the procedure. Post-procedure instructions were reviewed.  Patient left the clinic in stable condition.

## 2024-05-29 NOTE — Progress Notes (Signed)
 Cathy Gonzalez - 78 y.o. female MRN [id number]  Date of birth: [birth year]  Office Visit Note: Visit Date: 05/23/2024 PCP: Benjamine Aland, MD Referred by: Georgina Ozell LABOR, MD  Subjective: Chief Complaint  Patient presents with   Lower Back - Pain   HPI:  Cathy Gonzalez is a 78 y.o. female who comes in today for planned repeat Right L5-S1  Lumbar Interlaminar epidural steroid injection with fluoroscopic guidance.  The patient has failed conservative care including home exercise, medications, time and activity modification.  This injection will be diagnostic and hopefully therapeutic.  Please see requesting physician notes for further details and justification. Patient received more than 50% pain relief from prior injection.   Referring: Dr. Ozell Georgina   ROS Otherwise per HPI.  Assessment & Plan: Visit Diagnoses:    ICD-10-CM   1. Lumbar radiculopathy  M54.16 XR C-ARM NO REPORT    Epidural Steroid injection    methylPREDNISolone  acetate (DEPO-MEDROL ) injection 40 mg      Plan: No additional findings.   Meds & Orders:  Meds ordered this encounter  Medications   methylPREDNISolone  acetate (DEPO-MEDROL ) injection 40 mg    Orders Placed This Encounter  Procedures   XR C-ARM NO REPORT   Epidural Steroid injection    Follow-up: Return for visit to requesting provider as needed.   Procedures: No procedures performed  Lumbar Epidural Steroid Injection - Interlaminar Approach with Fluoroscopic Guidance  Patient: Cathy Gonzalez      Date of Birth: 07-25-1946 MRN: [id number] PCP: Benjamine Aland, MD      Visit Date: 05/23/2024   Universal Protocol:     Consent Given By: the patient  Position: PRONE  Additional Comments: Vital signs were monitored before and after the procedure. Patient was prepped and draped in the usual sterile fashion. The correct patient, procedure, and site was verified.   Injection Procedure Details:   Procedure diagnoses: Lumbar radiculopathy  [M54.16]   Meds Administered:  Meds ordered this encounter  Medications   methylPREDNISolone  acetate (DEPO-MEDROL ) injection 40 mg     Laterality: Right  Location/Site:  L5-S1  Needle: 4.5 in., 20 ga. Tuohy  Needle Placement: Paramedian epidural  Findings:   -Comments: Excellent flow of contrast into the epidural space.  Procedure Details: Using a paramedian approach from the side mentioned above, the region overlying the inferior lamina was localized under fluoroscopic visualization and the soft tissues overlying this structure were infiltrated with 4 ml. of 1% Lidocaine  without Epinephrine . The Tuohy needle was inserted into the epidural space using a paramedian approach.   The epidural space was localized using loss of resistance along with counter oblique bi-planar fluoroscopic views.  After negative aspirate for air, blood, and CSF, a 2 ml. volume of Isovue -250 was injected into the epidural space and the flow of contrast was observed. Radiographs were obtained for documentation purposes.    The injectate was administered into the level noted above.   Additional Comments:  The patient tolerated the procedure well Dressing: 2 x 2 sterile gauze and Band-Aid    Post-procedure details: Patient was observed during the procedure. Post-procedure instructions were reviewed.  Patient left the clinic in stable condition.   Clinical History: Narrative & Impression CLINICAL DATA:  Low back pain. RIGHT leg pain and weakness increasing for 1 year.   EXAM: MRI LUMBAR SPINE WITHOUT CONTRAST   TECHNIQUE: Multiplanar, multisequence MR imaging of the lumbar spine was performed. No intravenous contrast was administered.   COMPARISON:  MRI  lumbar spine 04/22/2011. Lumbosacral radiographs 12/11/2016.   FINDINGS: Segmentation:  Standard   Alignment:  Physiologic.   Vertebrae:  No fracture, evidence of discitis, or bone lesion.   Conus medullaris: Extends to the L1 level and  appears normal.   Paraspinal and other soft tissues: Unremarkable.   Disc levels:   L1-L2: Central and leftward protrusion. Mild facet arthropathy. No definite impingement.   L2-L3: Central extrusion. Posterior element hypertrophy. Mild stenosis. BILATERAL L3 nerve root impingement. No significant foraminal narrowing.   L3-L4: Central and rightward protrusion. Foraminal disc material extends into the extraforaminal compartment on the RIGHT. Posterior element hypertrophy. Moderate stenosis. RIGHT L3 and BILATERAL L4 nerve root impingement.   L4-L5: Central protrusion. Advanced posterior element hypertrophy. Moderate to severe stenosis. Foraminal and extraforaminal disc material to the LEFT. LEFT greater than RIGHT L4 and L5 nerve root impingement.   L5-S1: Central extrusion. Posterior element hypertrophy. Disc material extends into both foramina. Mild stenosis. BILATERAL L5 and S1 nerve root impingement.   Compared with 2012, there is worsening at all levels except L1-2, which appears improved since that time.   IMPRESSION: Advanced multilevel spondylosis, most pronounced from L2 through S1. Combination of disc herniations and posterior element hypertrophy results in potentially symptomatic neural impingement at L2-3, L3-4, L4-5, and L5-S1.     Electronically Signed   By: Norleen ONEIDA Catching M.D.   On: 12/28/2016 11:12     Objective:  VS:  HT:    WT:   BMI:     BP:(!) 145/83  HR:86bpm  TEMP: ( )  RESP:  Physical Exam Vitals and nursing note reviewed.  Constitutional:      General: She is not in acute distress.    Appearance: Normal appearance. She is not ill-appearing.  HENT:     Head: Normocephalic and atraumatic.     Right Ear: External ear normal.     Left Ear: External ear normal.  Eyes:     Extraocular Movements: Extraocular movements intact.  Cardiovascular:     Rate and Rhythm: Normal rate.     Pulses: Normal pulses.  Pulmonary:     Effort: Pulmonary  effort is normal. No respiratory distress.  Abdominal:     General: There is no distension.     Palpations: Abdomen is soft.  Musculoskeletal:        General: Tenderness present.     Cervical back: Neck supple.     Right lower leg: No edema.     Left lower leg: No edema.     Comments: Patient has good distal strength with no pain over the greater trochanters.  No clonus or focal weakness.  Skin:    Findings: No erythema, lesion or rash.  Neurological:     General: No focal deficit present.     Mental Status: She is alert and oriented to person, place, and time.     Sensory: No sensory deficit.     Motor: No weakness or abnormal muscle tone.     Coordination: Coordination normal.  Psychiatric:        Mood and Affect: Mood normal.        Behavior: Behavior normal.      Imaging: No results found.

## 2024-05-30 ENCOUNTER — Other Ambulatory Visit (HOSPITAL_COMMUNITY): Payer: Self-pay

## 2024-05-30 ENCOUNTER — Ambulatory Visit
Admission: RE | Admit: 2024-05-30 | Discharge: 2024-05-30 | Disposition: A | Discharge status: Home / Self Care | Source: Ambulatory Visit | Attending: Family Medicine | Admitting: Family Medicine

## 2024-05-30 DIAGNOSIS — Z1231 Encounter for screening mammogram for malignant neoplasm of breast: Secondary | ICD-10-CM | POA: Diagnosis not present

## 2024-06-02 ENCOUNTER — Other Ambulatory Visit (HOSPITAL_COMMUNITY): Payer: Self-pay

## 2024-06-09 NOTE — Progress Notes (Signed)
 Jonae Renshaw D, CMA  Joylene Bradley; Garcia, Patricia; Ziegler, Melissa; Tucker, Dolanda; Cain, Keeler New orders have been placed for the above pt, DOB:12/04/2045 Thanks  *this is an [employer] pt, MD mistyped DME in order. Please disregard that

## 2024-06-13 DIAGNOSIS — N189 Chronic kidney disease, unspecified: Secondary | ICD-10-CM | POA: Diagnosis not present

## 2024-06-13 DIAGNOSIS — I739 Peripheral vascular disease, unspecified: Secondary | ICD-10-CM | POA: Diagnosis not present

## 2024-06-13 DIAGNOSIS — E0840 Diabetes mellitus due to underlying condition with diabetic neuropathy, unspecified: Secondary | ICD-10-CM | POA: Diagnosis not present

## 2024-06-13 DIAGNOSIS — E1140 Type 2 diabetes mellitus with diabetic neuropathy, unspecified: Secondary | ICD-10-CM | POA: Diagnosis not present

## 2024-06-13 DIAGNOSIS — M130 Polyarthritis, unspecified: Secondary | ICD-10-CM | POA: Diagnosis not present

## 2024-06-13 DIAGNOSIS — I10 Essential (primary) hypertension: Secondary | ICD-10-CM | POA: Diagnosis not present

## 2024-06-13 DIAGNOSIS — E1122 Type 2 diabetes mellitus with diabetic chronic kidney disease: Secondary | ICD-10-CM | POA: Diagnosis not present

## 2024-06-13 DIAGNOSIS — G4730 Sleep apnea, unspecified: Secondary | ICD-10-CM | POA: Diagnosis not present

## 2024-06-13 DIAGNOSIS — M545 Low back pain, unspecified: Secondary | ICD-10-CM | POA: Diagnosis not present

## 2024-06-13 DIAGNOSIS — B182 Chronic viral hepatitis C: Secondary | ICD-10-CM | POA: Diagnosis not present

## 2024-06-14 ENCOUNTER — Other Ambulatory Visit: Payer: Self-pay

## 2024-06-14 ENCOUNTER — Other Ambulatory Visit (HOSPITAL_COMMUNITY): Payer: Self-pay

## 2024-06-14 ENCOUNTER — Other Ambulatory Visit: Payer: Self-pay | Admitting: Cardiovascular Disease

## 2024-06-15 ENCOUNTER — Other Ambulatory Visit (HOSPITAL_COMMUNITY): Payer: Self-pay

## 2024-06-15 MED ORDER — MONTELUKAST SODIUM 10 MG PO TABS
10.0000 mg | ORAL_TABLET | Freq: Every day | ORAL | 1 refills | Status: AC
  Filled 2024-06-15 – 2024-08-17 (×3): qty 90, 90d supply, fill #0

## 2024-06-16 ENCOUNTER — Other Ambulatory Visit (HOSPITAL_COMMUNITY): Payer: Self-pay

## 2024-06-16 MED ORDER — EMPAGLIFLOZIN 25 MG PO TABS
25.0000 mg | ORAL_TABLET | Freq: Every day | ORAL | 2 refills | Status: AC
  Filled 2024-06-16: qty 90, 90d supply, fill #0
  Filled 2024-08-17 – 2024-08-29 (×2): qty 90, 90d supply, fill #1

## 2024-06-19 DIAGNOSIS — G4733 Obstructive sleep apnea (adult) (pediatric): Secondary | ICD-10-CM | POA: Diagnosis not present

## 2024-06-30 ENCOUNTER — Other Ambulatory Visit: Payer: Self-pay

## 2024-06-30 ENCOUNTER — Other Ambulatory Visit: Payer: Self-pay | Admitting: Nurse Practitioner

## 2024-06-30 ENCOUNTER — Other Ambulatory Visit (HOSPITAL_COMMUNITY): Payer: Self-pay

## 2024-06-30 MED ORDER — POTASSIUM CHLORIDE CRYS ER 20 MEQ PO TBCR
20.0000 meq | EXTENDED_RELEASE_TABLET | Freq: Every day | ORAL | 3 refills | Status: AC
  Filled 2024-06-30: qty 90, 90d supply, fill #0
  Filled 2024-07-18 – 2024-09-04 (×4): qty 90, 90d supply, fill #1

## 2024-07-01 ENCOUNTER — Other Ambulatory Visit (HOSPITAL_COMMUNITY): Payer: Self-pay

## 2024-07-01 MED ORDER — METFORMIN HCL 500 MG PO TABS
500.0000 mg | ORAL_TABLET | Freq: Every evening | ORAL | 1 refills | Status: AC
  Filled 2024-07-01: qty 90, 90d supply, fill #0
  Filled 2024-07-18 – 2024-09-08 (×7): qty 90, 90d supply, fill #1

## 2024-07-02 ENCOUNTER — Other Ambulatory Visit (HOSPITAL_COMMUNITY): Payer: Self-pay

## 2024-07-04 ENCOUNTER — Other Ambulatory Visit: Payer: Self-pay

## 2024-07-04 ENCOUNTER — Other Ambulatory Visit (HOSPITAL_COMMUNITY): Payer: Self-pay

## 2024-07-07 ENCOUNTER — Other Ambulatory Visit (HOSPITAL_COMMUNITY): Payer: Self-pay

## 2024-07-11 ENCOUNTER — Other Ambulatory Visit (HOSPITAL_COMMUNITY): Payer: Self-pay

## 2024-07-11 MED ORDER — ZOSTER VAC RECOMB ADJUVANTED 50 MCG/0.5ML IM SUSR
0.5000 mL | Freq: Once | INTRAMUSCULAR | 0 refills | Status: AC
  Filled 2024-07-11: qty 0.5, 1d supply, fill #0

## 2024-07-13 ENCOUNTER — Telehealth: Payer: Self-pay

## 2024-07-13 NOTE — Telephone Encounter (Signed)
 Cpap order faxed to dme

## 2024-07-18 ENCOUNTER — Other Ambulatory Visit (HOSPITAL_COMMUNITY): Payer: Self-pay

## 2024-07-18 DIAGNOSIS — M545 Low back pain, unspecified: Secondary | ICD-10-CM | POA: Diagnosis not present

## 2024-07-18 DIAGNOSIS — N189 Chronic kidney disease, unspecified: Secondary | ICD-10-CM | POA: Diagnosis not present

## 2024-07-18 DIAGNOSIS — K7469 Other cirrhosis of liver: Secondary | ICD-10-CM | POA: Diagnosis not present

## 2024-07-18 DIAGNOSIS — K7581 Nonalcoholic steatohepatitis (NASH): Secondary | ICD-10-CM | POA: Diagnosis not present

## 2024-07-18 DIAGNOSIS — E1122 Type 2 diabetes mellitus with diabetic chronic kidney disease: Secondary | ICD-10-CM | POA: Diagnosis not present

## 2024-07-21 ENCOUNTER — Other Ambulatory Visit (HOSPITAL_COMMUNITY): Payer: Self-pay

## 2024-07-24 ENCOUNTER — Other Ambulatory Visit (HOSPITAL_COMMUNITY): Payer: Self-pay

## 2024-08-01 ENCOUNTER — Other Ambulatory Visit (HOSPITAL_COMMUNITY): Payer: Self-pay

## 2024-08-02 ENCOUNTER — Ambulatory Visit: Payer: Self-pay | Admitting: Neurology

## 2024-08-02 ENCOUNTER — Ambulatory Visit (INDEPENDENT_AMBULATORY_CARE_PROVIDER_SITE_OTHER): Payer: Self-pay | Admitting: Podiatry

## 2024-08-02 ENCOUNTER — Encounter: Payer: Self-pay | Admitting: Neurology

## 2024-08-02 DIAGNOSIS — M6289 Other specified disorders of muscle: Secondary | ICD-10-CM | POA: Diagnosis not present

## 2024-08-02 DIAGNOSIS — E1142 Type 2 diabetes mellitus with diabetic polyneuropathy: Secondary | ICD-10-CM | POA: Diagnosis not present

## 2024-08-02 NOTE — Patient Instructions (Signed)

## 2024-08-02 NOTE — Progress Notes (Unsigned)
 Chief Complaint  Patient presents with   Burning and Tingeling    Bilateral burning and tingling. It started on Friday and is getting worse, she has used a Salon Pas patch and that seemed to help some. It is waking her up at night now.    HPI: 78 y.o. female presents today with painful diabetic neuropathy.  She is scheduled for her regular diabetic footcare/nail care in 2 weeks.  She notes that she is also having issues with swelling in the legs.  We have been working on getting her set up with the lymphedema compression pumps to help address the swelling issues.  She had previously been taking brand-name Lyrica  300 mg daily, but is now on the generic version and states that she feels this is not as strong as the brand-name.  She has taken other neuropathy medications in the past without success.  She is not interested in pursuing physical therapy at this time.  Past Medical History:  Diagnosis Date   Arthritis    Cataract    left cataract removed in 2017   Compensated cirrhosis related to hepatitis C virus (HCV) (HCC) 03/06/2024   Diabetes mellitus    Diabetic neuropathy (HCC)    Diabetic neuropathy, type II diabetes mellitus (HCC)    Hepatitis C    took tx for    Hypertension    Hypokalemia    Knee pain    right    Morbidly obese (HCC)    NASH (nonalcoholic steatohepatitis) 03/06/2024   Nocturnal seizures (HCC) 05/14/2015   no bad seizure since 1980's   OSA on CPAP    uses cpap 2-3 week   Plantar fasciitis    both feet   Past Surgical History:  Procedure Laterality Date   ABDOMINAL HYSTERECTOMY     partial   ANTERIOR CERVICAL DECOMP/DISCECTOMY FUSION N/A 02/24/2022   Procedure: CERVICAL FOUR- CERVICAL FIVE ANTERIOR CERVICAL DECOMPRESSION/DISCECTOMY FUSION ONE LEVEL WITH ALLOGRAFT AND PLATE;  Surgeon: Barbarann Oneil BROCKS, MD;  Location: [facility];  Service: Orthopedics;  Laterality: N/A;   CATARACT EXTRACTION Left    CERVICAL SPINE SURGERY  05/2023   COLONOSCOPY WITH PROPOFOL  N/A  08/21/2015   Procedure: COLONOSCOPY WITH PROPOFOL ;  Surgeon: Belvie Just, MD;  Location: [facility];  Service: Endoscopy;  Laterality: N/A;   COLONOSCOPY WITH PROPOFOL  N/A 08/22/2022   Procedure: COLONOSCOPY WITH PROPOFOL ;  Surgeon: Just Belvie, MD;  Location: [facility];  Service: Gastroenterology;  Laterality: N/A;   ESOPHAGOGASTRODUODENOSCOPY (EGD) WITH PROPOFOL  N/A 08/21/2015   Procedure: ESOPHAGOGASTRODUODENOSCOPY (EGD) WITH PROPOFOL ;  Surgeon: Belvie Just, MD;  Location: [facility];  Service: Endoscopy;  Laterality: N/A;   ESOPHAGOGASTRODUODENOSCOPY (EGD) WITH PROPOFOL  N/A 10/08/2018   Procedure: ESOPHAGOGASTRODUODENOSCOPY (EGD) WITH PROPOFOL ;  Surgeon: Just Belvie, MD;  Location: [facility];  Service: Endoscopy;  Laterality: N/A;   ESOPHAGOGASTRODUODENOSCOPY (EGD) WITH PROPOFOL  N/A 08/22/2022   Procedure: ESOPHAGOGASTRODUODENOSCOPY (EGD) WITH PROPOFOL ;  Surgeon: Just Belvie, MD;  Location: [facility];  Service: Gastroenterology;  Laterality: N/A;   EYE SURGERY Bilateral    cataracts   POLYPECTOMY  08/22/2022   Procedure: POLYPECTOMY;  Surgeon: Just Belvie, MD;  Location: [facility];  Service: Gastroenterology;;   TUBAL LIGATION     Allergies  Allergen Reactions   Acetaminophen      Other reaction(s): GI upset, constipation if taken regularly    Physical Exam: Palpable pedal pulses.  +1 pitting edema bilateral legs and ankles.  Ankle dorsiflexion is less than 10 degrees with the knee extended bilateral.  No open  lesions are noted.  Negative Tinel's sign with percussion posterior tibial nerve bilateral.  Intact protective sensation with Semmes Weinstein monofilament but decreased vibratory sensation noted bilateral.  Assessment/Plan of Care: 1. Diabetic peripheral neuropathy associated with type 2 diabetes mellitus (HCC)   2. Tightness of both gastrocnemius muscles     AMB REFERRAL TO PAIN CLINIC  Patient was given stretching exercises to stretch the calf  muscles once to twice daily since she does not want to try physical therapy.  Will reach out to our compression pump representative and get a status update on her compression pumps.  Discussed referral to pain management for long-term management of her painful diabetic neuropathy.  Informed patient that Lyrica  is one of the strongest neuropathy meds, so if this is not working well for her, I do not have any other options at this time.  She agreed with the referral to pain clinic.   Awanda CHARM Imperial, DPM, FACFAS [facility]     [address] [place], [place] [address]                Office [phone]  Fax [phone]

## 2024-08-03 ENCOUNTER — Other Ambulatory Visit: Payer: Self-pay

## 2024-08-03 ENCOUNTER — Other Ambulatory Visit (HOSPITAL_COMMUNITY): Payer: Self-pay

## 2024-08-03 MED ORDER — GLUCOSE BLOOD VI STRP
ORAL_STRIP | 5 refills | Status: AC
  Filled 2024-08-03: qty 100, 25d supply, fill #0
  Filled 2024-08-17 – 2024-08-25 (×3): qty 100, 25d supply, fill #1
  Filled 2024-09-08 – 2024-09-13 (×2): qty 100, 25d supply, fill #2
  Filled 2024-10-15: qty 100, 25d supply, fill #3

## 2024-08-04 ENCOUNTER — Other Ambulatory Visit (HOSPITAL_COMMUNITY): Payer: Self-pay

## 2024-08-06 ENCOUNTER — Other Ambulatory Visit (HOSPITAL_COMMUNITY): Payer: Self-pay

## 2024-08-08 ENCOUNTER — Encounter: Payer: Self-pay | Admitting: Radiology

## 2024-08-08 ENCOUNTER — Ambulatory Visit (HOSPITAL_COMMUNITY)
Admission: RE | Admit: 2024-08-08 | Discharge: 2024-08-08 | Disposition: A | Discharge status: Home / Self Care | Source: Ambulatory Visit | Attending: Infectious Disease | Admitting: Infectious Disease

## 2024-08-08 DIAGNOSIS — K7469 Other cirrhosis of liver: Secondary | ICD-10-CM | POA: Insufficient documentation

## 2024-08-08 DIAGNOSIS — B1920 Unspecified viral hepatitis C without hepatic coma: Secondary | ICD-10-CM | POA: Insufficient documentation

## 2024-08-08 DIAGNOSIS — K7460 Unspecified cirrhosis of liver: Secondary | ICD-10-CM | POA: Diagnosis not present

## 2024-08-08 DIAGNOSIS — K7581 Nonalcoholic steatohepatitis (NASH): Secondary | ICD-10-CM | POA: Diagnosis not present

## 2024-08-12 DIAGNOSIS — I890 Lymphedema, not elsewhere classified: Secondary | ICD-10-CM | POA: Diagnosis not present

## 2024-08-15 ENCOUNTER — Ambulatory Visit

## 2024-08-15 ENCOUNTER — Ambulatory Visit (INDEPENDENT_AMBULATORY_CARE_PROVIDER_SITE_OTHER): Admitting: Podiatry

## 2024-08-15 DIAGNOSIS — M79674 Pain in right toe(s): Secondary | ICD-10-CM

## 2024-08-15 DIAGNOSIS — M79675 Pain in left toe(s): Secondary | ICD-10-CM

## 2024-08-15 DIAGNOSIS — E1151 Type 2 diabetes mellitus with diabetic peripheral angiopathy without gangrene: Secondary | ICD-10-CM

## 2024-08-15 DIAGNOSIS — I890 Lymphedema, not elsewhere classified: Secondary | ICD-10-CM

## 2024-08-15 DIAGNOSIS — M21622 Bunionette of left foot: Secondary | ICD-10-CM

## 2024-08-15 DIAGNOSIS — M2141 Flat foot [pes planus] (acquired), right foot: Secondary | ICD-10-CM

## 2024-08-15 DIAGNOSIS — B351 Tinea unguium: Secondary | ICD-10-CM | POA: Diagnosis not present

## 2024-08-15 NOTE — Progress Notes (Signed)
 Patient presents to the office today for diabetic shoe and insole measuring.  Patient was measured with brannock device to determine size and width for 1 pair of extra depth shoes and foam total contact inserts  Documentation of medical necessity will be sent to patient's treating diabetic doctor to verify and sign.  Patient also took ppw to drop off as well  Patient's diabetic provider: Kennieth Leech MD  Shoes and insoles will be ordered when ppw is received   Shoe size ordered: 11XWD W059 Blue HA  3pr Heat moldable DM inserts   HTA prior auth needed one ppw is back will req. Cathy Gonzalez Cped

## 2024-08-15 NOTE — Progress Notes (Signed)
 Subjective:  Patient ID: Cathy Gonzalez, female    DOB: Sep 19, 1946,  MRN: [id number]  Cathy Gonzalez presents to clinic today for:  Chief Complaint  Patient presents with   Diabetes    [facility] NIDDM A1C 6.1. Toenail trim.    Patient notes nails are thick, discolored, elongated and painful in shoegear when trying to ambulate.  She notes that she did get the compression pumps delivered but is waiting on the representative to give her instructions.  She has not went to pain management for the neuropathy symptoms  PCP is Benjamine Aland, MD.  Past Medical History:  Diagnosis Date   Arthritis    Cataract    left cataract removed in 2017   Compensated cirrhosis related to hepatitis C virus (HCV) (HCC) 03/06/2024   Diabetes mellitus    Diabetic neuropathy (HCC)    Diabetic neuropathy, type II diabetes mellitus (HCC)    Hepatitis C    took tx for    Hypertension    Hypokalemia    Knee pain    right    Morbidly obese (HCC)    NASH (nonalcoholic steatohepatitis) 03/06/2024   Nocturnal seizures (HCC) 05/14/2015   no bad seizure since 1980's   OSA on CPAP    uses cpap 2-3 week   Plantar fasciitis    both feet   Past Surgical History:  Procedure Laterality Date   ABDOMINAL HYSTERECTOMY     partial   ANTERIOR CERVICAL DECOMP/DISCECTOMY FUSION N/A 02/24/2022   Procedure: CERVICAL FOUR- CERVICAL FIVE ANTERIOR CERVICAL DECOMPRESSION/DISCECTOMY FUSION ONE LEVEL WITH ALLOGRAFT AND PLATE;  Surgeon: Barbarann Oneil BROCKS, MD;  Location: [facility];  Service: Orthopedics;  Laterality: N/A;   CATARACT EXTRACTION Left    CERVICAL SPINE SURGERY  05/2023   COLONOSCOPY WITH PROPOFOL  N/A 08/21/2015   Procedure: COLONOSCOPY WITH PROPOFOL ;  Surgeon: Belvie Just, MD;  [facility]: [facility];  Service: Endoscopy;  Laterality: N/A;   COLONOSCOPY WITH PROPOFOL  N/A 08/22/2022   Procedure: COLONOSCOPY WITH PROPOFOL ;  Surgeon: Just Belvie, MD;  Location: [facility];  Service: Gastroenterology;  Laterality: N/A;    ESOPHAGOGASTRODUODENOSCOPY (EGD) WITH PROPOFOL  N/A 08/21/2015   Procedure: ESOPHAGOGASTRODUODENOSCOPY (EGD) WITH PROPOFOL ;  Surgeon: Belvie Just, MD;  [facility]: [facility];  Service: Endoscopy;  Laterality: N/A;   ESOPHAGOGASTRODUODENOSCOPY (EGD) WITH PROPOFOL  N/A 10/08/2018   Procedure: ESOPHAGOGASTRODUODENOSCOPY (EGD) WITH PROPOFOL ;  Surgeon: Just Belvie, MD;  [facility];  Service: Endoscopy;  Laterality: N/A;   ESOPHAGOGASTRODUODENOSCOPY (EGD) WITH PROPOFOL  N/A 08/22/2022   Procedure: ESOPHAGOGASTRODUODENOSCOPY (EGD) WITH PROPOFOL ;  Surgeon: Just Belvie, MD;  [facility]: [facility];  Service: Gastroenterology;  Laterality: N/A;   EYE SURGERY Bilateral    cataracts   POLYPECTOMY  08/22/2022   Procedure: POLYPECTOMY;  Surgeon: Just Belvie, MD;  [facility]: [facility];  Service: Gastroenterology;;   TUBAL LIGATION     Allergies  Allergen Reactions   Acetaminophen      Other reaction(s): GI upset, constipation if taken regularly    Review of Systems: Negative except as noted in the HPI.  Objective:  Cathy Gonzalez is a pleasant 78 y.o. female in NAD. AAO x 3.  Vascular Examination: Capillary refill time is 3-5 seconds to toes bilateral. Palpable pedal pulses b/l LE. Digital hair present b/l.  Skin temperature gradient WNL b/l. No varicosities b/l. No cyanosis noted b/l.   Dermatological Examination: Pedal skin with normal turgor, texture and tone b/l. No open wounds. No interdigital macerations b/l. Toenails x10 are 3mm thick, discolored, dystrophic with  subungual debris. There is pain with compression of the nail plates.  They are elongated x10  Assessment/Plan: 1. Pain due to onychomycosis of toenails of both feet    The mycotic toenails were sharply debrided x10 with sterile nail nippers and a power debriding burr to decrease bulk/thickness and length.    Return in about 3 months (around 11/15/2024) for [facility].   Awanda CHARM Imperial, DPM, FACFAS [facility]     [address] [place], [place] [address]                Office [phone]  Fax [phone]

## 2024-08-18 ENCOUNTER — Other Ambulatory Visit (HOSPITAL_COMMUNITY): Payer: Self-pay

## 2024-08-18 ENCOUNTER — Other Ambulatory Visit: Payer: Self-pay

## 2024-08-19 ENCOUNTER — Other Ambulatory Visit (HOSPITAL_COMMUNITY): Payer: Self-pay

## 2024-08-23 ENCOUNTER — Other Ambulatory Visit (HOSPITAL_COMMUNITY): Payer: Self-pay

## 2024-08-25 ENCOUNTER — Other Ambulatory Visit: Payer: Self-pay | Admitting: Cardiovascular Disease

## 2024-08-25 DIAGNOSIS — E782 Mixed hyperlipidemia: Secondary | ICD-10-CM

## 2024-08-26 ENCOUNTER — Other Ambulatory Visit (HOSPITAL_COMMUNITY): Payer: Self-pay

## 2024-08-26 MED ORDER — REPATHA SURECLICK 140 MG/ML ~~LOC~~ SOAJ
1.0000 mL | SUBCUTANEOUS | 1 refills | Status: AC
  Filled 2024-08-26 – 2024-09-04 (×3): qty 6, 84d supply, fill #0
  Filled 2024-09-27 – 2024-10-18 (×3): qty 6, 84d supply, fill #1

## 2024-08-27 ENCOUNTER — Other Ambulatory Visit (HOSPITAL_COMMUNITY): Payer: Self-pay

## 2024-08-29 ENCOUNTER — Other Ambulatory Visit (HOSPITAL_COMMUNITY): Payer: Self-pay

## 2024-09-02 ENCOUNTER — Other Ambulatory Visit (HOSPITAL_COMMUNITY): Payer: Self-pay

## 2024-09-05 ENCOUNTER — Ambulatory Visit: Admitting: Orthopedic Surgery

## 2024-09-05 ENCOUNTER — Other Ambulatory Visit: Payer: Self-pay

## 2024-09-05 ENCOUNTER — Other Ambulatory Visit (HOSPITAL_COMMUNITY): Payer: Self-pay

## 2024-09-05 DIAGNOSIS — M1711 Unilateral primary osteoarthritis, right knee: Secondary | ICD-10-CM | POA: Diagnosis not present

## 2024-09-05 NOTE — Progress Notes (Unsigned)
 Subjective:  Chief Complaint: follow-up for compensated cirrhosis from hepatitis c (sp cure) and NASH   Patient ID: Cathy Gonzalez, female    DOB: 1946-02-20, 78 y.o.   MRN: [id number]  HPI  Past Medical History:  Diagnosis Date   Arthritis    Cataract    left cataract removed in 2017   Compensated cirrhosis related to hepatitis C virus (HCV) (HCC) 03/06/2024   Diabetes mellitus    Diabetic neuropathy (HCC)    Diabetic neuropathy, type II diabetes mellitus (HCC)    Hepatitis C    took tx for    Hypertension    Hypokalemia    Knee pain    right    Morbidly obese (HCC)    NASH (nonalcoholic steatohepatitis) 03/06/2024   Nocturnal seizures (HCC) 05/14/2015   no bad seizure since 1980's   OSA on CPAP    uses cpap 2-3 week   Plantar fasciitis    both feet    Past Surgical History:  Procedure Laterality Date   ABDOMINAL HYSTERECTOMY     partial   ANTERIOR CERVICAL DECOMP/DISCECTOMY FUSION N/A 02/24/2022   Procedure: CERVICAL FOUR- CERVICAL FIVE ANTERIOR CERVICAL DECOMPRESSION/DISCECTOMY FUSION ONE LEVEL WITH ALLOGRAFT AND PLATE;  Surgeon: Barbarann Oneil BROCKS, MD;  Location: [facility];  Service: Orthopedics;  Laterality: N/A;   CATARACT EXTRACTION Left    CERVICAL SPINE SURGERY  05/2023   COLONOSCOPY WITH PROPOFOL  N/A 08/21/2015   Procedure: COLONOSCOPY WITH PROPOFOL ;  Surgeon: Belvie Just, MD;  Location: [facility];  Service: Endoscopy;  Laterality: N/A;   COLONOSCOPY WITH PROPOFOL  N/A 08/22/2022   Procedure: COLONOSCOPY WITH PROPOFOL ;  Surgeon: Just Belvie, MD;  Location: [facility];  Service: Gastroenterology;  Laterality: N/A;   ESOPHAGOGASTRODUODENOSCOPY (EGD) WITH PROPOFOL  N/A 08/21/2015   Procedure: ESOPHAGOGASTRODUODENOSCOPY (EGD) WITH PROPOFOL ;  Surgeon: Belvie Just, MD;  [facility]: [facility];  Service: Endoscopy;  Laterality: N/A;   ESOPHAGOGASTRODUODENOSCOPY (EGD) WITH PROPOFOL  N/A 10/08/2018   Procedure: ESOPHAGOGASTRODUODENOSCOPY (EGD) WITH PROPOFOL ;  Surgeon:  Just Belvie, MD;  [facility]: [facility];  Service: Endoscopy;  Laterality: N/A;   ESOPHAGOGASTRODUODENOSCOPY (EGD) WITH PROPOFOL  N/A 08/22/2022   Procedure: ESOPHAGOGASTRODUODENOSCOPY (EGD) WITH PROPOFOL ;  Surgeon: Just Belvie, MD;  Location: [facility];  Service: Gastroenterology;  Laterality: N/A;   EYE SURGERY Bilateral    cataracts   POLYPECTOMY  08/22/2022   Procedure: POLYPECTOMY;  Surgeon: Just Belvie, MD;  [facility]: [facility];  Service: Gastroenterology;;   TUBAL LIGATION      Family History  Problem Relation Age of Onset   Diabetes Mother    Heart attack Mother    Diabetes Sister    Breast cancer Sister    Diabetes Brother    Seizures Neg Hx       Social History   Socioeconomic History   Marital status: Single    Spouse name: Not on file   Number of children: 4   Years of education: 12   Highest education level: Not on file  Occupational History   Occupation: [employer]  Tobacco Use   Smoking status: Never   Smokeless tobacco: Never  Vaping Use   Vaping status: Never Used  Substance and Sexual Activity   Alcohol use: No    Alcohol/week: 0.0 standard drinks of alcohol   Drug use: No   Sexual activity: Never  Other Topics Concern   Not on file  Social History Narrative   Patient occasionally drinks caffeine.   Patient is right handed.   Social Drivers of Health  Financial Resource Strain: Not on file  Food Insecurity: Food Insecurity Present (04/10/2020)   Hunger Vital Sign    Worried About Running Out of Food in the Last Year: Sometimes true    Ran Out of Food in the Last Year: Never true  Transportation Needs: No Transportation Needs (04/10/2020)   PRAPARE - Administrator, Civil Service (Medical): No    Lack of Transportation (Non-Medical): No  Physical Activity: Not on file  Stress: Not on file  Social Connections: Not on file    Allergies  Allergen Reactions   Acetaminophen      Other reaction(s): GI upset, constipation if  taken regularly     Current Outpatient Medications:    amLODipine  (NORVASC ) 10 MG tablet, Take 1 tablet (10 mg total) by mouth daily., Disp: 90 tablet, Rfl: 3   Chlorphen-PE-Acetaminophen  (NOREL AD) 4-10-325 MG TABS, Take 1 tablet by mouth 4 (four) times daily., Disp: 20 tablet, Rfl: 4   colchicine  0.6 MG tablet, Take 1 tablet (0.6 mg total) by mouth daily., Disp: 90 tablet, Rfl: 1   empagliflozin  (JARDIANCE ) 25 MG TABS tablet, Take 1 tablet (25 mg total) by mouth daily., Disp: 90 tablet, Rfl: 2   Evolocumab  (REPATHA  SURECLICK) 140 MG/ML SOAJ, Inject 140 mg into the skin every 14 (fourteen) days., Disp: 6 mL, Rfl: 1   ezetimibe  (ZETIA ) 10 MG tablet, Take 1 tablet (10 mg) by mouth at bedtime., Disp: 90 tablet, Rfl: 3   furosemide  (LASIX ) 40 MG tablet, Take 1 tablet (40 mg total) by mouth at bedtime., Disp: 90 tablet, Rfl: 3   glucose blood (ONETOUCH VERIO) test strip, Use to test blood glucose 4 times daily., Disp: 100 each, Rfl: 5   insulin  aspart (NOVOLOG  FLEXPEN) 100 UNIT/ML FlexPen, Use as directed sliding scale 4-5 units daily subcutaneous if blood sugar over 150. Not to exceed more than 100units daily, Disp: , Rfl:    insulin  aspart (NOVOLOG  FLEXPEN) 100 UNIT/ML FlexPen, Use as directed sliding scale 4-5 units daily subcutaneously if blood sugar over 150. Not to exceed more than 100units daily, Disp: 15 mL, Rfl: 3   levETIRAcetam  (KEPPRA ) 1000 MG tablet, Take 1.5 tablets by mouth in the morning and 2 tablets at bedtime, Disp: 320 tablet, Rfl: 3   meloxicam  (MOBIC ) 15 MG tablet, Take 1 tablet (15 mg total) by mouth daily for arthritis, Disp: 30 tablet, Rfl: 0   metFORMIN  (GLUCOPHAGE ) 500 MG tablet, Take 1 tablet (500 mg) by mouth every evening as directed, Disp: 90 tablet, Rfl: 1   montelukast  (SINGULAIR ) 10 MG tablet, Take 1 tablet (10 mg total) by mouth daily., Disp: 90 tablet, Rfl: 1   ONETOUCH DELICA LANCETS 33G MISC, , Disp: , Rfl: 3   potassium chloride  SA (KLOR-CON  M) 20 MEQ tablet,  Take 1 tablet (20 mEq total) by mouth daily., Disp: 90 tablet, Rfl: 3   pregabalin  (LYRICA ) 300 MG capsule, Take 1 capsule (300 mg total) by mouth 2 (two) times daily., Disp: 180 capsule, Rfl: 1   spironolactone  (ALDACTONE ) 25 MG tablet, Take 1 tablet (25 mg total) by mouth daily., Disp: 90 tablet, Rfl: 3   sucralfate  (CARAFATE ) 1 G tablet, Take 1 g by mouth in the morning, at noon, and at bedtime., Disp: , Rfl:    sucralfate  (CARAFATE ) 1 g tablet, Take 1 tablet (1 g total) by mouth daily with meals and at bedtime, Disp: 120 tablet, Rfl: 4   telmisartan  (MICARDIS ) 80 MG tablet, Take 1 tablet (80 mg total)  by mouth daily., Disp: 90 tablet, Rfl: 3    Review of Systems     Objective:   Physical Exam        Assessment & Plan:

## 2024-09-05 NOTE — Progress Notes (Signed)
 Orthopedic Spine Surgery Office Note   Assessment: Patient is a 78 y.o. female with low back pain that radiates into her bilateral posterior thighs and legs with standing and walking but improves with sitting, consistent with neurogenic claudication.  Injections have been controlling her pain.  Also comes in with right knee pain consistent with osteoarthritis     Plan: -Patient has tried PT, meloxicam , lyrica , lumbar steroid injections - Patient was interested in a right knee intra-articular injection today.  This was done today in the office.  See procedure note below -If she is not any better at her next visit, will order an MRI to evaluate for spinal stenosis -For her knee, I told her that she could get repeat injections up to every 3 months -Since she did get a good response with the injection, I told her that she could call or send me a MyChart message if she would like a repeat injection should her lumbar symptoms return -Patient should return to office on an as needed basis     Right knee injection note: After discussing the risk, benefits, alternatives of right knee intra-articular injection, patient elected to proceed.  Patient was in the seated position with the knee at 90 degrees.  The anterior lateral soft spot was prepped with an alcohol-based prep.  Ethyl chloride was used to anesthetize the skin.  A 20-gauge needle was used to inject 1 cc of lidocaine , 1 cc of bupivacaine , 1 cc of Depo-Medrol  into the intra-articular space under standard sterile technique.  Needle was withdrawn and Band-Aid was applied.  Patient tolerated the procedure well.   ___________________________________________________________________________     History:   Patient is a 78 y.o. female who presents today for follow-up on her lumbar spine.  Patient has had pain in her low back that radiates into her posterior thighs and legs.  She notes that when she is standing or walking and gets better if she sits down  or leans forward.  After last visit, she got an injection with Dr. Eldonna.  She noticed about 80% relief with that injection.  She has been doing well and only notes pain if she is particularly active.  She is also coming today to discuss right knee pain.  She has had right knee pain in the past.  She had previously seen an orthopedist who had done an injection.  She found that helpful and it gave her months of relief.  She has been using a knee brace and over-the-counter medications but has not found those before.  There is no recent trauma or injury that preceded the onset of this knee pain.  She has had this knee in the past as stated above and this feels very similar.   Treatments tried: PT, meloxicam , lyrica , lumbar steroid injections     Physical Exam:   General: no acute distress, appears stated age Neurologic: alert, answering questions appropriately, following commands Respiratory: unlabored breathing on room air, symmetric chest rise Psychiatric: appropriate affect, normal cadence to speech     MSK (spine):   -Strength exam                                                   Left                  Right EHL  5/5                  5/5 TA                                 5/5                  5/5 GSC                             5/5                  5/5 Knee extension            5/5                  5/5 Hip flexion                    5/5                  5/5   -Sensory exam                           Sensation intact to light touch in L3-S1 nerve distributions of bilateral lower extremities   Right knee exam: Pain but no palpable click with McMurray, palpable crepitus through range of motion, knee ROM from 0-110, knee stable to varus and valgus stress, negative Lachman, negative posterior drawer   Imaging: XRs of the lumbar spine from 04/18/2024 were previously independently reviewed and interpreted, showing loss of lumbar lordosis.  Disc height loss with  anterior osteophyte formation between the L2 vertebra and S1 vertebra.  No evidence of instability on flexion/extension views.  No fracture or dislocation seen.     Patient name: Cathy Gonzalez Patient MRN: [id number] Date of visit: 09/05/24

## 2024-09-06 ENCOUNTER — Other Ambulatory Visit (HOSPITAL_COMMUNITY): Payer: Self-pay

## 2024-09-06 ENCOUNTER — Ambulatory Visit: Admitting: Infectious Disease

## 2024-09-06 ENCOUNTER — Ambulatory Visit: Admitting: Physician Assistant

## 2024-09-06 ENCOUNTER — Encounter: Payer: Self-pay | Admitting: Infectious Disease

## 2024-09-06 ENCOUNTER — Other Ambulatory Visit: Payer: Self-pay

## 2024-09-06 ENCOUNTER — Encounter: Payer: Self-pay | Admitting: Physician Assistant

## 2024-09-06 VITALS — BP 139/84 | HR 87 | Temp 97.7°F | Ht 64.0 in | Wt 244.0 lb

## 2024-09-06 DIAGNOSIS — K7469 Other cirrhosis of liver: Secondary | ICD-10-CM | POA: Diagnosis not present

## 2024-09-06 DIAGNOSIS — K7581 Nonalcoholic steatohepatitis (NASH): Secondary | ICD-10-CM | POA: Diagnosis not present

## 2024-09-06 DIAGNOSIS — M25521 Pain in right elbow: Secondary | ICD-10-CM | POA: Diagnosis not present

## 2024-09-06 DIAGNOSIS — Z23 Encounter for immunization: Secondary | ICD-10-CM

## 2024-09-06 DIAGNOSIS — R7303 Prediabetes: Secondary | ICD-10-CM

## 2024-09-06 DIAGNOSIS — E66813 Obesity, class 3: Secondary | ICD-10-CM | POA: Diagnosis not present

## 2024-09-06 DIAGNOSIS — Z7185 Encounter for immunization safety counseling: Secondary | ICD-10-CM | POA: Diagnosis not present

## 2024-09-06 DIAGNOSIS — M1711 Unilateral primary osteoarthritis, right knee: Secondary | ICD-10-CM

## 2024-09-06 DIAGNOSIS — Z6841 Body Mass Index (BMI) 40.0 and over, adult: Secondary | ICD-10-CM | POA: Diagnosis not present

## 2024-09-06 DIAGNOSIS — B1920 Unspecified viral hepatitis C without hepatic coma: Secondary | ICD-10-CM

## 2024-09-06 NOTE — Progress Notes (Signed)
 Office Visit Note   Patient: Cathy Gonzalez           Date of Birth: [birth year]           MRN: [id number] Visit Date: 09/06/2024              Requested by: Benjamine Aland, MD [address] [place],  [place] [address] PCP: Benjamine Aland, MD   Assessment & Plan: Visit Diagnoses:  1. Pain in right elbow     Plan: Pleasant 78 year old lady with right elbow pain without trauma.  She does say she may have fallen this on his right elbow many years ago.  She has excellent motion no pain with supination pronation no pain with flexion or extension she does have pain with resisted triceps testing consistent with some distal tendinitis in her triceps.  She does have a small traction spur consistent with this.  I recommended treating this symptomatically with topical medication such as Voltaren  gel or Aspercreme.  I offered her therapy which she declined at this time she may follow-up as needed  Follow-Up Instructions: No follow-ups on file.   Orders:  Orders Placed This Encounter  Procedures   XR Elbow Complete Right (3+View)   No orders of the defined types were placed in this encounter.     Procedures: No procedures performed   Clinical Data: No additional findings.   Subjective: No chief complaint on file.   HPI patient is a pleasant 78 year old woman who comes in today with a chief complaint of nontraumatic right elbow pain.  Denies any injury denies any fever chills  Review of Systems  All other systems reviewed and are negative.    Objective: Vital Signs: There were no vitals taken for this visit.  Physical Exam Constitutional:      Appearance: Normal appearance.  Skin:    General: Skin is warm and dry.  Neurological:     General: No focal deficit present.     Mental Status: She is alert and oriented to person, place, and time.  Psychiatric:        Mood and Affect: Mood normal.        Behavior: Behavior normal.     Ortho Exam Examination of her right elbow  shows a strong pulse no redness no erythema no paresthesias no radicular findings.  Focal pain over the insertion of the triceps on the left none.  She has good biceps and triceps function though with resisted triceps she does have a little reproduced pain.  She has good supination pronation and good grip strength brisk capillary refill pulses are intact.  No fluctuance no sign of infection Specialty Comments:  Narrative & Impression CLINICAL DATA:  Low back pain. RIGHT leg pain and weakness increasing for 1 year.   EXAM: MRI LUMBAR SPINE WITHOUT CONTRAST   TECHNIQUE: Multiplanar, multisequence MR imaging of the lumbar spine was performed. No intravenous contrast was administered.   COMPARISON:  MRI lumbar spine 04/22/2011. Lumbosacral radiographs 12/11/2016.   FINDINGS: Segmentation:  Standard   Alignment:  Physiologic.   Vertebrae:  No fracture, evidence of discitis, or bone lesion.   Conus medullaris: Extends to the L1 level and appears normal.   Paraspinal and other soft tissues: Unremarkable.   Disc levels:   L1-L2: Central and leftward protrusion. Mild facet arthropathy. No definite impingement.   L2-L3: Central extrusion. Posterior element hypertrophy. Mild stenosis. BILATERAL L3 nerve root impingement. No significant foraminal narrowing.   L3-L4: Central and rightward  protrusion. Foraminal disc material extends into the extraforaminal compartment on the RIGHT. Posterior element hypertrophy. Moderate stenosis. RIGHT L3 and BILATERAL L4 nerve root impingement.   L4-L5: Central protrusion. Advanced posterior element hypertrophy. Moderate to severe stenosis. Foraminal and extraforaminal disc material to the LEFT. LEFT greater than RIGHT L4 and L5 nerve root impingement.   L5-S1: Central extrusion. Posterior element hypertrophy. Disc material extends into both foramina. Mild stenosis. BILATERAL L5 and S1 nerve root impingement.   Compared with 2012, there is  worsening at all levels except L1-2, which appears improved since that time.   IMPRESSION: Advanced multilevel spondylosis, most pronounced from L2 through S1. Combination of disc herniations and posterior element hypertrophy results in potentially symptomatic neural impingement at L2-3, L3-4, L4-5, and L5-S1.     Electronically Signed   By: Norleen ONEIDA Catching M.D.   On: 12/28/2016 11:12  Imaging: XR KNEE 3 VIEW RIGHT Result Date: 09/05/2024 Right knee x-rays from 09/05/2024 were independently reviewed and interpreted, showing joint space narrowing in the medial and patellofemoral compartments.  There is osteophyte formation seen in the lateral and patellofemoral compartments.  Subchondral sclerosis seen in the Tello femoral compartment.  No fracture or dislocation seen.    PMFS History: Patient Active Problem List   Diagnosis Date Noted   NASH (nonalcoholic steatohepatitis) 03/06/2024   Compensated cirrhosis related to hepatitis C virus (HCV) (HCC) 03/06/2024   Gouty neuritis 03/16/2023   Paroxysmal A-fib (HCC) 03/16/2023   Pain in right hand 10/28/2022   Impingement syndrome of right shoulder 08/12/2022   S/P cervical spinal fusion 06/25/2022   Pain due to onychomycosis of toenails of both feet 05/06/2022   Cervical stenosis of spine 02/24/2022   Neck pain 12/03/2021   Abnormal liver function tests 02/12/2021   Diarrhea 02/12/2021   Gastro-esophageal reflux disease without esophagitis 02/12/2021   Periumbilical pain 02/12/2021   History of colonic polyps 02/12/2021   Portal hypertension (HCC) 02/12/2021   Screening for malignant neoplasm of colon 02/12/2021   Type 2 diabetes mellitus without complications (HCC) 02/12/2021   Primary osteoarthritis, left hand 11/07/2020   Arthritis of carpometacarpal (CMC) joint of thumb 07/18/2020   Bilateral primary osteoarthritis of knee 06/20/2020   Bilateral primary osteoarthritis of hip 06/07/2020   Right thigh pain 05/09/2020   Low back  pain 07/14/2019   OSA on CPAP 05/16/2019   Diabetic neuropathy associated with type 2 diabetes mellitus (HCC) 12/13/2018   Chronic pain of right knee 12/09/2018   Thumb pain, left 12/09/2018   Dizziness and giddiness 01/14/2016   Hepatic cirrhosis (HCC) 07/26/2015   Nocturnal seizures (HCC) 05/14/2015   Chronic hepatitis C without hepatic coma (HCC) 03/14/2015   Vertigo, central 02/11/2014   Difficulty walking 02/11/2014   Diabetes mellitus (HCC) 02/11/2014   Dysphagia 02/11/2014   Class 3 severe obesity with serious comorbidity and body mass index (BMI) of 40.0 to 44.9 in adult, unspecified obesity type (HCC) 07/26/2010   Essential hypertension, benign 07/26/2010   DEGENERATIVE JOINT DISEASE, GENERALIZED 07/26/2010   Past Medical History:  Diagnosis Date   Arthritis    Cataract    left cataract removed in 2017   Compensated cirrhosis related to hepatitis C virus (HCV) (HCC) 03/06/2024   Diabetes mellitus    Diabetic neuropathy (HCC)    Diabetic neuropathy, type II diabetes mellitus (HCC)    Hepatitis C    took tx for    Hypertension    Hypokalemia    Knee pain    right  Morbidly obese (HCC)    NASH (nonalcoholic steatohepatitis) 03/06/2024   Nocturnal seizures (HCC) 05/14/2015   no bad seizure since 1980's   OSA on CPAP    uses cpap 2-3 week   Plantar fasciitis    both feet    Family History  Problem Relation Age of Onset   Diabetes Mother    Heart attack Mother    Diabetes Sister    Breast cancer Sister    Diabetes Brother    Seizures Neg Hx     Past Surgical History:  Procedure Laterality Date   ABDOMINAL HYSTERECTOMY     partial   ANTERIOR CERVICAL DECOMP/DISCECTOMY FUSION N/A 02/24/2022   Procedure: CERVICAL FOUR- CERVICAL FIVE ANTERIOR CERVICAL DECOMPRESSION/DISCECTOMY FUSION ONE LEVEL WITH ALLOGRAFT AND PLATE;  Surgeon: Barbarann Oneil BROCKS, MD;  Location: [facility] OR;  Service: Orthopedics;  Laterality: N/A;   CATARACT EXTRACTION Left    CERVICAL SPINE SURGERY   05/2023   COLONOSCOPY WITH PROPOFOL  N/A 08/21/2015   Procedure: COLONOSCOPY WITH PROPOFOL ;  Surgeon: Belvie Just, MD;  Location: [facility];  Service: Endoscopy;  Laterality: N/A;   COLONOSCOPY WITH PROPOFOL  N/A 08/22/2022   Procedure: COLONOSCOPY WITH PROPOFOL ;  Surgeon: Just Belvie, MD;  Location: [facility];  Service: Gastroenterology;  Laterality: N/A;   ESOPHAGOGASTRODUODENOSCOPY (EGD) WITH PROPOFOL  N/A 08/21/2015   Procedure: ESOPHAGOGASTRODUODENOSCOPY (EGD) WITH PROPOFOL ;  Surgeon: Belvie Just, MD;  Location: [facility];  Service: Endoscopy;  Laterality: N/A;   ESOPHAGOGASTRODUODENOSCOPY (EGD) WITH PROPOFOL  N/A 10/08/2018   Procedure: ESOPHAGOGASTRODUODENOSCOPY (EGD) WITH PROPOFOL ;  Surgeon: Just Belvie, MD;  Location: [facility];  Service: Endoscopy;  Laterality: N/A;   ESOPHAGOGASTRODUODENOSCOPY (EGD) WITH PROPOFOL  N/A 08/22/2022   Procedure: ESOPHAGOGASTRODUODENOSCOPY (EGD) WITH PROPOFOL ;  Surgeon: Just Belvie, MD;  Location: [facility];  Service: Gastroenterology;  Laterality: N/A;   EYE SURGERY Bilateral    cataracts   POLYPECTOMY  08/22/2022   Procedure: POLYPECTOMY;  Surgeon: Just Belvie, MD;  Location: [facility];  Service: Gastroenterology;;   TUBAL LIGATION     Social History   Occupational History   Occupation: [employer]  Tobacco Use   Smoking status: Never   Smokeless tobacco: Never  Vaping Use   Vaping status: Never Used  Substance and Sexual Activity   Alcohol use: No    Alcohol/week: 0.0 standard drinks of alcohol   Drug use: No   Sexual activity: Never

## 2024-09-07 ENCOUNTER — Ambulatory Visit: Payer: Self-pay

## 2024-09-07 LAB — CBC WITH DIFFERENTIAL/PLATELET
Absolute Lymphocytes: 1758 {cells}/uL (ref 850–3900)
Absolute Monocytes: 1298 {cells}/uL — ABNORMAL HIGH (ref 200–950)
Basophils Absolute: 35 {cells}/uL (ref 0–200)
Basophils Relative: 0.3 %
Eosinophils Absolute: 0 {cells}/uL — ABNORMAL LOW (ref 15–500)
Eosinophils Relative: 0 %
HCT: 41 % (ref 35.9–46.0)
Hemoglobin: 13.4 g/dL (ref 11.7–15.5)
MCH: 29.5 pg (ref 27.0–33.0)
MCHC: 32.7 g/dL (ref 31.6–35.4)
MCV: 90.1 fL (ref 81.4–101.7)
MPV: 9.8 fL (ref 7.5–12.5)
Monocytes Relative: 11 %
Neutro Abs: 8708 {cells}/uL — ABNORMAL HIGH (ref 1500–7800)
Neutrophils Relative %: 73.8 %
Platelets: 335 Thousand/uL (ref 140–400)
RBC: 4.55 Million/uL (ref 3.80–5.10)
RDW: 12.9 % (ref 11.0–15.0)
Total Lymphocyte: 14.9 %
WBC: 11.8 Thousand/uL — ABNORMAL HIGH (ref 3.8–10.8)

## 2024-09-07 LAB — COMPLETE METABOLIC PANEL WITHOUT GFR
AG Ratio: 1.7 (calc) (ref 1.0–2.5)
ALT: 54 U/L — ABNORMAL HIGH (ref 6–29)
AST: 34 U/L (ref 10–35)
Albumin: 5 g/dL (ref 3.6–5.1)
Alkaline phosphatase (APISO): 69 U/L (ref 37–153)
BUN/Creatinine Ratio: 17 (calc) (ref 6–22)
BUN: 28 mg/dL — ABNORMAL HIGH (ref 7–25)
CO2: 25 mmol/L (ref 20–32)
Calcium: 10.2 mg/dL (ref 8.6–10.4)
Chloride: 103 mmol/L (ref 98–110)
Creat: 1.67 mg/dL — ABNORMAL HIGH (ref 0.60–1.00)
Globulin: 2.9 g/dL (ref 1.9–3.7)
Glucose, Bld: 128 mg/dL — ABNORMAL HIGH (ref 65–99)
Potassium: 4 mmol/L (ref 3.5–5.3)
Sodium: 142 mmol/L (ref 135–146)
Total Bilirubin: 0.3 mg/dL (ref 0.2–1.2)
Total Protein: 7.9 g/dL (ref 6.1–8.1)

## 2024-09-07 NOTE — Telephone Encounter (Signed)
 Spoke with pt regarding results. Pcp is monitoring renal function. Lorenda CHRISTELLA Code, RMA

## 2024-09-07 NOTE — Telephone Encounter (Signed)
-----   Message from [place] sent at 09/07/2024  3:40 PM EST ----- Renal fxn slightly worse and would have follow-up with Pcp ----- Message ----- From: Rebecka Hose Lab Results In Sent: 09/06/2024  11:10 PM EST To: Jomarie LOISE Fleeta Kathie, MD

## 2024-09-08 ENCOUNTER — Other Ambulatory Visit (HOSPITAL_COMMUNITY): Payer: Self-pay

## 2024-09-09 ENCOUNTER — Other Ambulatory Visit (HOSPITAL_COMMUNITY): Payer: Self-pay

## 2024-09-10 ENCOUNTER — Other Ambulatory Visit (HOSPITAL_COMMUNITY): Payer: Self-pay

## 2024-09-18 DIAGNOSIS — G4733 Obstructive sleep apnea (adult) (pediatric): Secondary | ICD-10-CM | POA: Diagnosis not present

## 2024-09-20 ENCOUNTER — Other Ambulatory Visit (HOSPITAL_COMMUNITY): Payer: Self-pay

## 2024-09-27 ENCOUNTER — Other Ambulatory Visit (HOSPITAL_COMMUNITY): Payer: Self-pay

## 2024-09-30 ENCOUNTER — Other Ambulatory Visit (HOSPITAL_COMMUNITY): Payer: Self-pay

## 2024-09-30 MED ORDER — NEOMYCIN-POLYMYXIN-DEXAMETH 0.1 % OP SUSP
1.0000 [drp] | Freq: Four times a day (QID) | OPHTHALMIC | 0 refills | Status: AC
  Filled 2024-09-30: qty 5, 14d supply, fill #0

## 2024-10-01 ENCOUNTER — Other Ambulatory Visit (HOSPITAL_COMMUNITY): Payer: Self-pay

## 2024-10-01 MED ORDER — COLCHICINE 0.6 MG PO TABS
0.6000 mg | ORAL_TABLET | Freq: Every day | ORAL | 1 refills | Status: AC
  Filled 2024-10-01 (×2): qty 90, 90d supply, fill #0
  Filled 2024-11-09: qty 90, 90d supply, fill #1

## 2024-10-03 ENCOUNTER — Other Ambulatory Visit: Payer: Self-pay

## 2024-10-04 ENCOUNTER — Other Ambulatory Visit (HOSPITAL_COMMUNITY): Payer: Self-pay

## 2024-10-04 ENCOUNTER — Other Ambulatory Visit: Payer: Self-pay

## 2024-10-04 ENCOUNTER — Encounter: Payer: Self-pay | Admitting: Podiatry

## 2024-10-04 ENCOUNTER — Telehealth: Payer: Self-pay | Admitting: Podiatry

## 2024-10-04 NOTE — Telephone Encounter (Signed)
 Please call with status of orthotics

## 2024-10-05 ENCOUNTER — Other Ambulatory Visit: Payer: Self-pay

## 2024-10-05 ENCOUNTER — Other Ambulatory Visit (HOSPITAL_COMMUNITY): Payer: Self-pay

## 2024-10-05 NOTE — Telephone Encounter (Signed)
 Thank you for the update!

## 2024-10-06 ENCOUNTER — Other Ambulatory Visit (HOSPITAL_COMMUNITY): Payer: Self-pay

## 2024-10-06 MED ORDER — SUCRALFATE 1 G PO TABS
1.0000 g | ORAL_TABLET | Freq: Three times a day (TID) | ORAL | 2 refills | Status: AC
  Filled 2024-10-06: qty 120, 30d supply, fill #0
  Filled 2024-10-15 – 2024-10-29 (×2): qty 120, 30d supply, fill #1

## 2024-10-07 ENCOUNTER — Other Ambulatory Visit (HOSPITAL_COMMUNITY): Payer: Self-pay

## 2024-10-14 NOTE — Telephone Encounter (Signed)
 Patient called wanting to know if we have received the paperwork for diabetic shoes from her PCP. She said that she left a message earlier this week in regards to this and would like someone to give her a call back.

## 2024-10-15 ENCOUNTER — Other Ambulatory Visit (HOSPITAL_COMMUNITY): Payer: Self-pay

## 2024-10-16 ENCOUNTER — Other Ambulatory Visit (HOSPITAL_COMMUNITY): Payer: Self-pay

## 2024-10-18 ENCOUNTER — Other Ambulatory Visit (HOSPITAL_COMMUNITY): Payer: Self-pay

## 2024-10-18 ENCOUNTER — Other Ambulatory Visit: Payer: Self-pay

## 2024-10-18 MED ORDER — PREGABALIN 300 MG PO CAPS
300.0000 mg | ORAL_CAPSULE | Freq: Two times a day (BID) | ORAL | 2 refills | Status: AC
  Filled 2024-10-18: qty 180, 90d supply, fill #0

## 2024-10-19 ENCOUNTER — Encounter: Payer: Self-pay | Admitting: Podiatry

## 2024-10-19 ENCOUNTER — Ambulatory Visit: Admitting: Podiatry

## 2024-10-19 DIAGNOSIS — M7751 Other enthesopathy of right foot: Secondary | ICD-10-CM

## 2024-10-19 NOTE — Progress Notes (Signed)
 Patient presents with complaint of pain in the posterior plantar aspect of the heel right.  More towards the medial aspect of the heel and the lateral.  Bothers her when she is at work.  Has not noticed any drainage.  Has not noticed any blistering.  Says the skin is a little red at times.  No fever chills nausea or vomiting.  No history of any trauma.   Physical exam:  General appearance: Pleasant, and in no acute distress. AOx3.  Vascular: Pedal pulses: DP 2/4 bilaterally, PT 2 to/4 bilaterally.  Moderate edema lower legs bilaterally. Capillary fill time immediate bilaterally.  Neurological: Light touch intact feet bilaterally.  Normal Achilles reflex bilaterally.  No clonus or spasticity noted.  Vibratory sensation slightly diminished feet bilaterally  Dermatologic:   Skin normal temperature bilaterally.  Skin normal color, tone, and texture bilaterally.   Musculoskeletal: Tenderness plantar posterior aspect heel more to the medial aspect of the foot on the right.  No defects in Achilles tendon noted.  Normal muscle strength lower extremity bilaterally.  No tenderness lateral compression of the calcaneus   Diagnosis: 1.  Bursitis posterior plantar heel right  Plan: -New patient office visit for evaluation and management. 3. - Discussed with her the pain apparent plantar posterior aspect of the heel right.  Made but may also be developing a slight preulcerative changes in the area.  No signs of blistering or breakdown of skin noted however is very tender but is probably mostly from the inflamed bursa.  Will get her into a surgical shoe for outside of work for weightbearing and then get her cushioned heel lift to wear while at work and her regular work shoes.  Told to watch for any signs of breakdown or ulceration or blistering. -Dispensed surgical shoe right. - Dispensed heel lift right.   -Discussed well cushioned socks with noncotton fiber.  Return 2 weeks follow-up

## 2024-10-20 ENCOUNTER — Other Ambulatory Visit (HOSPITAL_COMMUNITY): Payer: Self-pay

## 2024-10-22 ENCOUNTER — Other Ambulatory Visit (HOSPITAL_COMMUNITY): Payer: Self-pay

## 2024-10-25 ENCOUNTER — Other Ambulatory Visit (HOSPITAL_COMMUNITY): Payer: Self-pay

## 2024-10-25 ENCOUNTER — Encounter: Payer: Self-pay | Admitting: Podiatrist

## 2024-10-25 NOTE — Progress Notes (Signed)
 Paperwork for diabetic shoes was received on 10/21/2024 Shoes and 3 pr trilaminated heat moldable inserts were ordered 10/25/2024 from [employer]  Shoes are the no 59 in womens 11 XW  Will call when shoes are in and ready for pick up.  No HTA auth needed for 2026.

## 2024-10-26 ENCOUNTER — Other Ambulatory Visit (HOSPITAL_COMMUNITY): Payer: Self-pay

## 2024-10-29 ENCOUNTER — Other Ambulatory Visit (HOSPITAL_COMMUNITY): Payer: Self-pay

## 2024-10-31 ENCOUNTER — Ambulatory Visit: Admitting: Podiatry

## 2024-11-02 ENCOUNTER — Telehealth: Payer: Self-pay | Admitting: Podiatry

## 2024-11-02 NOTE — Telephone Encounter (Signed)
 Diabetic shoes are in [place] office. Spoke to [facility] and scheduled her an appointment on 11/04/2024.

## 2024-11-04 ENCOUNTER — Ambulatory Visit: Admitting: Podiatrist

## 2024-11-04 DIAGNOSIS — M21622 Bunionette of left foot: Secondary | ICD-10-CM | POA: Diagnosis not present

## 2024-11-04 DIAGNOSIS — E1142 Type 2 diabetes mellitus with diabetic polyneuropathy: Secondary | ICD-10-CM

## 2024-11-04 DIAGNOSIS — M2141 Flat foot [pes planus] (acquired), right foot: Secondary | ICD-10-CM | POA: Diagnosis not present

## 2024-11-04 DIAGNOSIS — E1151 Type 2 diabetes mellitus with diabetic peripheral angiopathy without gangrene: Secondary | ICD-10-CM | POA: Diagnosis not present

## 2024-11-04 DIAGNOSIS — M2142 Flat foot [pes planus] (acquired), left foot: Secondary | ICD-10-CM | POA: Diagnosis not present

## 2024-11-04 NOTE — Progress Notes (Signed)
" ° °  The patient presented to the office to day to pick up diabetic shoes and 3 pr diabetic custom inserts.  1 pr of  inserts were put in the shoes and the shoes were fitted to the patient.   The foot ortheses offered full contact with plantar surface and contoured the arch well.   The shoes fit well with no heel slippage or areas of pressure concern.   Instructions for break in and wear were dispensed as well as instructions for changing out the diabetic insoles every 4 months.  The  delivery documentation and break in instruction forms were signed and a copy of the paperwork was given to the patient.    Patient advised to contact us  if any problems arise.  Patient also advised on how to report any issues  Lamarr Salen, DPM "

## 2024-11-07 ENCOUNTER — Ambulatory Visit: Admitting: Podiatry

## 2024-11-08 ENCOUNTER — Ambulatory Visit: Admitting: Podiatry

## 2024-11-09 ENCOUNTER — Other Ambulatory Visit (HOSPITAL_COMMUNITY): Payer: Self-pay

## 2024-11-09 MED ORDER — ACCU-CHEK GUIDE TEST VI STRP
ORAL_STRIP | 5 refills | Status: AC
  Filled 2024-11-09: qty 200, 50d supply, fill #0

## 2024-11-10 ENCOUNTER — Telehealth: Payer: Self-pay | Admitting: Podiatry

## 2024-11-10 ENCOUNTER — Other Ambulatory Visit (HOSPITAL_COMMUNITY): Payer: Self-pay

## 2024-11-10 NOTE — Telephone Encounter (Signed)
 Cathy Gonzalez requested a follow up regarding being charged a $25 co-pay for her visit with Dr. Christine on 10/19/24. She said she has been in contact with her insurance company and was told that all her foot care is covered; it is also indicated on her coverage card that Podiatry is $0 for the copay. Advised: I'd get it over to billing for review   *The copay may be showing because the appt type was for an EST PAT and not [facility]

## 2024-11-11 ENCOUNTER — Other Ambulatory Visit (HOSPITAL_COMMUNITY): Payer: Self-pay

## 2024-11-11 MED ORDER — LANCETS 30G MISC
0 refills | Status: AC
  Filled 2024-11-11: qty 100, 100d supply, fill #0

## 2024-11-11 MED ORDER — SUCRALFATE 1 G PO TABS
1.0000 g | ORAL_TABLET | Freq: Every day | ORAL | 2 refills | Status: AC
  Filled 2024-11-11: qty 120, 30d supply, fill #0

## 2024-11-11 MED ORDER — BLOOD GLUCOSE MONITOR KIT
PACK | 0 refills | Status: AC
  Filled 2024-11-11: qty 1, 30d supply, fill #0

## 2024-11-21 ENCOUNTER — Ambulatory Visit: Admitting: Podiatry

## 2024-11-28 ENCOUNTER — Ambulatory Visit: Admitting: Podiatry

## 2024-11-28 ENCOUNTER — Encounter: Admitting: Orthopedic Surgery

## 2024-12-05 ENCOUNTER — Ambulatory Visit: Admitting: Podiatry

## 2025-03-21 ENCOUNTER — Ambulatory Visit: Admitting: Neurology
